# Patient Record
Sex: Male | Born: 1935 | Race: White | Hispanic: No | Smoking: Former smoker
Health system: Southern US, Community
[De-identification: ages and names within clinical notes are randomized; demographics above are authoritative.]

## PROBLEM LIST (undated history)

## (undated) DIAGNOSIS — I714 Abdominal aortic aneurysm, without rupture, unspecified: Secondary | ICD-10-CM

## (undated) DIAGNOSIS — N281 Cyst of kidney, acquired: Secondary | ICD-10-CM

## (undated) DIAGNOSIS — I350 Nonrheumatic aortic (valve) stenosis: Secondary | ICD-10-CM

## (undated) DIAGNOSIS — D7589 Other specified diseases of blood and blood-forming organs: Secondary | ICD-10-CM

## (undated) DIAGNOSIS — C61 Malignant neoplasm of prostate: Secondary | ICD-10-CM

## (undated) DIAGNOSIS — Z9981 Dependence on supplemental oxygen: Secondary | ICD-10-CM

## (undated) DIAGNOSIS — D509 Iron deficiency anemia, unspecified: Secondary | ICD-10-CM

## (undated) DIAGNOSIS — A0472 Enterocolitis due to Clostridium difficile, not specified as recurrent: Secondary | ICD-10-CM

## (undated) DIAGNOSIS — E119 Type 2 diabetes mellitus without complications: Secondary | ICD-10-CM

## (undated) DIAGNOSIS — J189 Pneumonia, unspecified organism: Secondary | ICD-10-CM

## (undated) DIAGNOSIS — K219 Gastro-esophageal reflux disease without esophagitis: Secondary | ICD-10-CM

## (undated) DIAGNOSIS — I10 Essential (primary) hypertension: Secondary | ICD-10-CM

## (undated) DIAGNOSIS — J449 Chronic obstructive pulmonary disease, unspecified: Secondary | ICD-10-CM

## (undated) DIAGNOSIS — M1990 Unspecified osteoarthritis, unspecified site: Secondary | ICD-10-CM

## (undated) DIAGNOSIS — E669 Obesity, unspecified: Secondary | ICD-10-CM

## (undated) DIAGNOSIS — E039 Hypothyroidism, unspecified: Secondary | ICD-10-CM

## (undated) DIAGNOSIS — I482 Chronic atrial fibrillation, unspecified: Secondary | ICD-10-CM

## (undated) DIAGNOSIS — I509 Heart failure, unspecified: Secondary | ICD-10-CM

## (undated) DIAGNOSIS — I214 Non-ST elevation (NSTEMI) myocardial infarction: Secondary | ICD-10-CM

## (undated) DIAGNOSIS — R58 Hemorrhage, not elsewhere classified: Secondary | ICD-10-CM

## (undated) DIAGNOSIS — I5021 Acute systolic (congestive) heart failure: Secondary | ICD-10-CM

## (undated) DIAGNOSIS — I2510 Atherosclerotic heart disease of native coronary artery without angina pectoris: Secondary | ICD-10-CM

## (undated) DIAGNOSIS — E785 Hyperlipidemia, unspecified: Secondary | ICD-10-CM

## (undated) DIAGNOSIS — H9190 Unspecified hearing loss, unspecified ear: Secondary | ICD-10-CM

## (undated) DIAGNOSIS — K259 Gastric ulcer, unspecified as acute or chronic, without hemorrhage or perforation: Secondary | ICD-10-CM

## (undated) DIAGNOSIS — I341 Nonrheumatic mitral (valve) prolapse: Secondary | ICD-10-CM

## (undated) DIAGNOSIS — M7989 Other specified soft tissue disorders: Secondary | ICD-10-CM

## (undated) DIAGNOSIS — R7302 Impaired glucose tolerance (oral): Secondary | ICD-10-CM

## (undated) DIAGNOSIS — I2723 Pulmonary hypertension due to lung diseases and hypoxia: Secondary | ICD-10-CM

## (undated) DIAGNOSIS — I071 Rheumatic tricuspid insufficiency: Secondary | ICD-10-CM

## (undated) DIAGNOSIS — D369 Benign neoplasm, unspecified site: Secondary | ICD-10-CM

## (undated) HISTORY — DX: Essential (primary) hypertension: I10

## (undated) HISTORY — DX: Nonrheumatic mitral (valve) prolapse: I34.1

## (undated) HISTORY — DX: Type 2 diabetes mellitus without complications: E11.9

## (undated) HISTORY — DX: Chronic obstructive pulmonary disease, unspecified: J44.9

## (undated) HISTORY — DX: Abdominal aortic aneurysm, without rupture: I71.4

## (undated) HISTORY — DX: Obesity, unspecified: E66.9

## (undated) HISTORY — PX: KNEE SURGERY: SHX244

## (undated) HISTORY — DX: Iron deficiency anemia, unspecified: D50.9

## (undated) HISTORY — DX: Chronic atrial fibrillation, unspecified: I48.20

## (undated) HISTORY — PX: APPENDECTOMY: SHX54

## (undated) HISTORY — DX: Pneumonia, unspecified organism: J18.9

## (undated) HISTORY — DX: Heart failure, unspecified: I50.9

## (undated) HISTORY — DX: Gastric ulcer, unspecified as acute or chronic, without hemorrhage or perforation: K25.9

## (undated) HISTORY — DX: Cyst of kidney, acquired: N28.1

## (undated) HISTORY — PX: TONSILLECTOMY: SUR1361

## (undated) HISTORY — PX: BLADDER SURGERY: SHX569

## (undated) HISTORY — DX: Hyperlipidemia, unspecified: E78.5

## (undated) HISTORY — DX: Atherosclerotic heart disease of native coronary artery without angina pectoris: I25.10

## (undated) HISTORY — DX: Malignant neoplasm of prostate: C61

## (undated) HISTORY — DX: Gastro-esophageal reflux disease without esophagitis: K21.9

## (undated) HISTORY — DX: Unspecified osteoarthritis, unspecified site: M19.90

## (undated) HISTORY — DX: Abdominal aortic aneurysm, without rupture, unspecified: I71.40

## (undated) HISTORY — PX: BACK SURGERY: SHX140

## (undated) HISTORY — DX: Impaired glucose tolerance (oral): R73.02

---

## 1992-06-24 DIAGNOSIS — I2510 Atherosclerotic heart disease of native coronary artery without angina pectoris: Secondary | ICD-10-CM

## 1992-06-24 HISTORY — DX: Atherosclerotic heart disease of native coronary artery without angina pectoris: I25.10

## 1997-12-15 ENCOUNTER — Inpatient Hospital Stay (HOSPITAL_COMMUNITY): Admission: EM | Admit: 1997-12-15 | Discharge: 1997-12-19 | Payer: Self-pay | Admitting: Emergency Medicine

## 1999-11-05 ENCOUNTER — Encounter: Admission: RE | Admit: 1999-11-05 | Discharge: 1999-11-08 | Payer: Self-pay | Admitting: Urology

## 2001-05-20 ENCOUNTER — Ambulatory Visit (HOSPITAL_COMMUNITY): Admission: RE | Admit: 2001-05-20 | Discharge: 2001-05-20 | Payer: Self-pay | Admitting: Cardiology

## 2001-06-24 DIAGNOSIS — I341 Nonrheumatic mitral (valve) prolapse: Secondary | ICD-10-CM

## 2001-06-24 HISTORY — PX: MITRAL VALVE REPLACEMENT (MVR)/CORONARY ARTERY BYPASS GRAFTING (CABG): SHX5984

## 2001-06-24 HISTORY — DX: Nonrheumatic mitral (valve) prolapse: I34.1

## 2001-07-09 ENCOUNTER — Ambulatory Visit: Admission: RE | Admit: 2001-07-09 | Discharge: 2001-07-09 | Payer: Self-pay | Admitting: Pulmonary Disease

## 2001-07-14 ENCOUNTER — Ambulatory Visit (HOSPITAL_COMMUNITY): Admission: RE | Admit: 2001-07-14 | Discharge: 2001-07-14 | Payer: Self-pay | Admitting: *Deleted

## 2001-07-17 ENCOUNTER — Ambulatory Visit (HOSPITAL_COMMUNITY): Admission: RE | Admit: 2001-07-17 | Discharge: 2001-07-17 | Payer: Self-pay | Admitting: Cardiology

## 2001-07-23 ENCOUNTER — Ambulatory Visit (HOSPITAL_COMMUNITY): Admission: RE | Admit: 2001-07-23 | Discharge: 2001-07-24 | Payer: Self-pay | Admitting: *Deleted

## 2001-07-24 ENCOUNTER — Encounter: Payer: Self-pay | Admitting: *Deleted

## 2001-07-29 ENCOUNTER — Inpatient Hospital Stay (HOSPITAL_COMMUNITY)
Admission: RE | Admit: 2001-07-29 | Discharge: 2001-08-11 | Payer: Self-pay | Admitting: Thoracic Surgery (Cardiothoracic Vascular Surgery)

## 2001-07-29 ENCOUNTER — Encounter: Payer: Self-pay | Admitting: Thoracic Surgery (Cardiothoracic Vascular Surgery)

## 2001-07-29 ENCOUNTER — Encounter (INDEPENDENT_AMBULATORY_CARE_PROVIDER_SITE_OTHER): Payer: Self-pay | Admitting: Specialist

## 2001-07-30 ENCOUNTER — Encounter: Payer: Self-pay | Admitting: Thoracic Surgery (Cardiothoracic Vascular Surgery)

## 2001-07-31 ENCOUNTER — Encounter: Payer: Self-pay | Admitting: Thoracic Surgery (Cardiothoracic Vascular Surgery)

## 2001-08-01 ENCOUNTER — Encounter: Payer: Self-pay | Admitting: Thoracic Surgery (Cardiothoracic Vascular Surgery)

## 2001-08-02 ENCOUNTER — Encounter: Payer: Self-pay | Admitting: Thoracic Surgery (Cardiothoracic Vascular Surgery)

## 2001-08-04 ENCOUNTER — Encounter: Payer: Self-pay | Admitting: Thoracic Surgery (Cardiothoracic Vascular Surgery)

## 2001-08-07 ENCOUNTER — Encounter: Payer: Self-pay | Admitting: Thoracic Surgery (Cardiothoracic Vascular Surgery)

## 2001-08-24 ENCOUNTER — Emergency Department (HOSPITAL_COMMUNITY): Admission: EM | Admit: 2001-08-24 | Discharge: 2001-08-24 | Payer: Self-pay | Admitting: Emergency Medicine

## 2001-08-24 ENCOUNTER — Encounter: Payer: Self-pay | Admitting: Emergency Medicine

## 2001-08-31 ENCOUNTER — Encounter
Admission: RE | Admit: 2001-08-31 | Discharge: 2001-08-31 | Payer: Self-pay | Admitting: Thoracic Surgery (Cardiothoracic Vascular Surgery)

## 2001-08-31 ENCOUNTER — Encounter: Payer: Self-pay | Admitting: Thoracic Surgery (Cardiothoracic Vascular Surgery)

## 2001-09-04 ENCOUNTER — Emergency Department (HOSPITAL_COMMUNITY): Admission: EM | Admit: 2001-09-04 | Discharge: 2001-09-04 | Payer: Self-pay | Admitting: Emergency Medicine

## 2001-09-04 ENCOUNTER — Encounter: Payer: Self-pay | Admitting: Emergency Medicine

## 2001-09-11 ENCOUNTER — Encounter: Payer: Self-pay | Admitting: Neurology

## 2001-09-11 ENCOUNTER — Ambulatory Visit (HOSPITAL_COMMUNITY): Admission: RE | Admit: 2001-09-11 | Discharge: 2001-09-11 | Payer: Self-pay | Admitting: Neurology

## 2001-09-23 ENCOUNTER — Encounter (HOSPITAL_COMMUNITY): Admission: RE | Admit: 2001-09-23 | Discharge: 2001-10-23 | Payer: Self-pay

## 2002-01-06 ENCOUNTER — Encounter (HOSPITAL_COMMUNITY): Admission: RE | Admit: 2002-01-06 | Discharge: 2002-02-05 | Payer: Self-pay | Admitting: Family Medicine

## 2002-06-24 DIAGNOSIS — K259 Gastric ulcer, unspecified as acute or chronic, without hemorrhage or perforation: Secondary | ICD-10-CM

## 2002-06-24 HISTORY — DX: Gastric ulcer, unspecified as acute or chronic, without hemorrhage or perforation: K25.9

## 2003-03-01 ENCOUNTER — Inpatient Hospital Stay (HOSPITAL_COMMUNITY): Admission: AD | Admit: 2003-03-01 | Discharge: 2003-03-06 | Payer: Self-pay | Admitting: Internal Medicine

## 2003-03-03 HISTORY — PX: ESOPHAGOGASTRODUODENOSCOPY: SHX1529

## 2004-02-17 ENCOUNTER — Encounter (HOSPITAL_COMMUNITY): Admission: RE | Admit: 2004-02-17 | Discharge: 2004-03-18 | Payer: Self-pay | Admitting: Oncology

## 2004-02-17 ENCOUNTER — Encounter: Admission: RE | Admit: 2004-02-17 | Discharge: 2004-02-17 | Payer: Self-pay | Admitting: Oncology

## 2004-04-13 ENCOUNTER — Encounter: Admission: RE | Admit: 2004-04-13 | Discharge: 2004-04-13 | Payer: Self-pay | Admitting: Oncology

## 2004-04-13 ENCOUNTER — Encounter (HOSPITAL_COMMUNITY): Admission: RE | Admit: 2004-04-13 | Discharge: 2004-05-13 | Payer: Self-pay | Admitting: Oncology

## 2004-05-07 ENCOUNTER — Ambulatory Visit: Payer: Self-pay | Admitting: Family Medicine

## 2004-05-07 ENCOUNTER — Ambulatory Visit: Payer: Self-pay | Admitting: *Deleted

## 2004-05-23 ENCOUNTER — Ambulatory Visit: Payer: Self-pay | Admitting: Family Medicine

## 2004-06-07 ENCOUNTER — Ambulatory Visit: Payer: Self-pay | Admitting: *Deleted

## 2004-06-21 ENCOUNTER — Ambulatory Visit: Payer: Self-pay | Admitting: Orthopedic Surgery

## 2004-07-02 ENCOUNTER — Ambulatory Visit: Payer: Self-pay | Admitting: Cardiology

## 2004-07-23 ENCOUNTER — Ambulatory Visit: Payer: Self-pay | Admitting: Family Medicine

## 2004-07-31 ENCOUNTER — Ambulatory Visit: Payer: Self-pay | Admitting: Cardiology

## 2004-08-29 ENCOUNTER — Ambulatory Visit: Payer: Self-pay | Admitting: Cardiology

## 2004-08-31 ENCOUNTER — Inpatient Hospital Stay (HOSPITAL_COMMUNITY): Admission: EM | Admit: 2004-08-31 | Discharge: 2004-09-04 | Payer: Self-pay | Admitting: Emergency Medicine

## 2004-08-31 ENCOUNTER — Ambulatory Visit: Payer: Self-pay | Admitting: Internal Medicine

## 2004-09-03 HISTORY — PX: ESOPHAGOGASTRODUODENOSCOPY: SHX1529

## 2004-09-03 HISTORY — PX: COLONOSCOPY: SHX174

## 2004-09-11 ENCOUNTER — Ambulatory Visit: Payer: Self-pay | Admitting: Family Medicine

## 2004-09-13 ENCOUNTER — Ambulatory Visit: Payer: Self-pay | Admitting: Internal Medicine

## 2004-10-23 ENCOUNTER — Ambulatory Visit: Payer: Self-pay | Admitting: *Deleted

## 2004-10-23 ENCOUNTER — Ambulatory Visit: Payer: Self-pay | Admitting: Family Medicine

## 2004-11-14 ENCOUNTER — Ambulatory Visit: Payer: Self-pay | Admitting: Internal Medicine

## 2004-11-14 ENCOUNTER — Ambulatory Visit: Payer: Self-pay | Admitting: Family Medicine

## 2004-11-14 ENCOUNTER — Ambulatory Visit (HOSPITAL_COMMUNITY): Admission: RE | Admit: 2004-11-14 | Discharge: 2004-11-14 | Payer: Self-pay | Admitting: Internal Medicine

## 2004-11-14 HISTORY — PX: ESOPHAGOGASTRODUODENOSCOPY: SHX1529

## 2004-11-14 HISTORY — PX: COLONOSCOPY: SHX174

## 2004-11-26 ENCOUNTER — Ambulatory Visit: Payer: Self-pay | Admitting: Family Medicine

## 2004-12-05 ENCOUNTER — Ambulatory Visit: Payer: Self-pay | Admitting: Internal Medicine

## 2004-12-26 ENCOUNTER — Ambulatory Visit: Payer: Self-pay | Admitting: Family Medicine

## 2005-01-11 ENCOUNTER — Ambulatory Visit: Payer: Self-pay | Admitting: Cardiology

## 2005-01-15 ENCOUNTER — Ambulatory Visit (HOSPITAL_COMMUNITY): Admission: RE | Admit: 2005-01-15 | Discharge: 2005-01-15 | Payer: Self-pay | Admitting: Cardiology

## 2005-01-15 ENCOUNTER — Ambulatory Visit: Payer: Self-pay | Admitting: Cardiology

## 2005-01-16 ENCOUNTER — Ambulatory Visit: Payer: Self-pay | Admitting: Internal Medicine

## 2005-01-22 ENCOUNTER — Ambulatory Visit: Payer: Self-pay | Admitting: *Deleted

## 2005-01-29 ENCOUNTER — Ambulatory Visit: Payer: Self-pay | Admitting: Internal Medicine

## 2005-01-29 ENCOUNTER — Ambulatory Visit (HOSPITAL_COMMUNITY): Admission: RE | Admit: 2005-01-29 | Discharge: 2005-01-29 | Payer: Self-pay | Admitting: Internal Medicine

## 2005-01-29 HISTORY — PX: COLONOSCOPY: SHX174

## 2005-02-12 ENCOUNTER — Ambulatory Visit: Payer: Self-pay | Admitting: Cardiology

## 2005-04-03 ENCOUNTER — Ambulatory Visit: Payer: Self-pay | Admitting: Family Medicine

## 2005-06-24 HISTORY — PX: EYE SURGERY: SHX253

## 2005-07-04 ENCOUNTER — Ambulatory Visit: Payer: Self-pay | Admitting: Family Medicine

## 2005-09-10 ENCOUNTER — Ambulatory Visit: Payer: Self-pay | Admitting: Family Medicine

## 2005-10-17 ENCOUNTER — Ambulatory Visit: Payer: Self-pay | Admitting: Family Medicine

## 2005-10-24 ENCOUNTER — Ambulatory Visit (HOSPITAL_COMMUNITY): Admission: RE | Admit: 2005-10-24 | Discharge: 2005-10-24 | Payer: Self-pay | Admitting: Family Medicine

## 2005-12-20 ENCOUNTER — Ambulatory Visit: Payer: Self-pay | Admitting: Family Medicine

## 2006-02-28 ENCOUNTER — Ambulatory Visit: Payer: Self-pay | Admitting: Family Medicine

## 2006-04-21 ENCOUNTER — Ambulatory Visit: Payer: Self-pay | Admitting: Family Medicine

## 2006-04-25 ENCOUNTER — Ambulatory Visit: Payer: Self-pay | Admitting: Family Medicine

## 2006-04-29 ENCOUNTER — Ambulatory Visit (HOSPITAL_COMMUNITY): Admission: RE | Admit: 2006-04-29 | Discharge: 2006-04-29 | Payer: Self-pay | Admitting: Family Medicine

## 2006-06-02 ENCOUNTER — Ambulatory Visit: Payer: Self-pay | Admitting: Family Medicine

## 2006-07-14 ENCOUNTER — Ambulatory Visit: Payer: Self-pay | Admitting: Family Medicine

## 2006-08-04 ENCOUNTER — Encounter: Payer: Self-pay | Admitting: Family Medicine

## 2006-08-04 LAB — CONVERTED CEMR LAB
ALT: 14 units/L (ref 0–53)
AST: 18 units/L (ref 0–37)
Albumin: 4.4 g/dL (ref 3.5–5.2)
Alkaline Phosphatase: 112 units/L (ref 39–117)
Calcium: 9 mg/dL (ref 8.4–10.5)
Creatinine, Ser: 0.92 mg/dL (ref 0.40–1.50)
HDL: 36 mg/dL — ABNORMAL LOW (ref >39)
Sodium: 142 meq/L (ref 135–145)
Total CHOL/HDL Ratio: 3.1
Total Protein: 7.5 g/dL (ref 6.0–8.3)
Triglycerides: 109 mg/dL (ref <150)

## 2006-08-15 ENCOUNTER — Ambulatory Visit: Payer: Self-pay | Admitting: Family Medicine

## 2006-09-26 ENCOUNTER — Ambulatory Visit: Payer: Self-pay | Admitting: Family Medicine

## 2006-12-01 ENCOUNTER — Encounter: Payer: Self-pay | Admitting: Family Medicine

## 2006-12-01 LAB — CONVERTED CEMR LAB
AST: 17 units/L (ref 0–37)
Alkaline Phosphatase: 105 units/L (ref 39–117)
BUN: 20 mg/dL (ref 6–23)
Calcium: 8.9 mg/dL (ref 8.4–10.5)
Chloride: 105 meq/L (ref 96–112)
Cholesterol: 109 mg/dL (ref 0–200)
Creatinine, Ser: 1.09 mg/dL (ref 0.40–1.50)
Indirect Bilirubin: 0.3 mg/dL (ref 0.0–0.9)
LDL Cholesterol: 47 mg/dL (ref 0–99)
Total Protein: 7.6 g/dL (ref 6.0–8.3)
Triglycerides: 97 mg/dL (ref <150)

## 2006-12-03 ENCOUNTER — Ambulatory Visit: Payer: Self-pay | Admitting: Family Medicine

## 2006-12-09 ENCOUNTER — Encounter: Payer: Self-pay | Admitting: Family Medicine

## 2006-12-29 ENCOUNTER — Ambulatory Visit: Payer: Self-pay | Admitting: Family Medicine

## 2007-01-07 ENCOUNTER — Ambulatory Visit: Payer: Self-pay | Admitting: Family Medicine

## 2007-01-28 ENCOUNTER — Ambulatory Visit: Payer: Self-pay | Admitting: Internal Medicine

## 2007-01-28 ENCOUNTER — Inpatient Hospital Stay (HOSPITAL_COMMUNITY): Admission: EM | Admit: 2007-01-28 | Discharge: 2007-02-02 | Payer: Self-pay | Admitting: Emergency Medicine

## 2007-01-30 ENCOUNTER — Encounter (INDEPENDENT_AMBULATORY_CARE_PROVIDER_SITE_OTHER): Payer: Self-pay | Admitting: Urology

## 2007-02-12 ENCOUNTER — Ambulatory Visit: Payer: Self-pay | Admitting: Family Medicine

## 2007-03-17 ENCOUNTER — Ambulatory Visit: Payer: Self-pay | Admitting: Cardiology

## 2007-03-26 ENCOUNTER — Encounter: Payer: Self-pay | Admitting: Family Medicine

## 2007-03-26 LAB — CONVERTED CEMR LAB
Eosinophils Absolute: 0.4 10*3/uL (ref 0.0–0.7)
Eosinophils Relative: 5 % (ref 0–5)
HCT: 40.8 % (ref 39.0–52.0)
Lymphs Abs: 1.2 10*3/uL (ref 0.7–3.3)
MCV: 82.9 fL (ref 78.0–100.0)
Monocytes Relative: 10 % (ref 3–11)
PSA: 25.92 ng/mL — ABNORMAL HIGH (ref 0.10–4.00)
Platelets: 271 10*3/uL (ref 150–400)
WBC: 7.9 10*3/uL (ref 4.0–10.5)

## 2007-04-01 ENCOUNTER — Ambulatory Visit: Payer: Self-pay | Admitting: Family Medicine

## 2007-06-02 ENCOUNTER — Ambulatory Visit: Payer: Self-pay | Admitting: Family Medicine

## 2007-06-12 ENCOUNTER — Encounter: Payer: Self-pay | Admitting: Family Medicine

## 2007-08-19 ENCOUNTER — Ambulatory Visit: Payer: Self-pay | Admitting: Family Medicine

## 2007-12-17 ENCOUNTER — Ambulatory Visit: Payer: Self-pay | Admitting: Family Medicine

## 2007-12-17 DIAGNOSIS — E739 Lactose intolerance, unspecified: Secondary | ICD-10-CM | POA: Insufficient documentation

## 2007-12-17 DIAGNOSIS — E785 Hyperlipidemia, unspecified: Secondary | ICD-10-CM

## 2007-12-17 DIAGNOSIS — I10 Essential (primary) hypertension: Secondary | ICD-10-CM

## 2007-12-17 HISTORY — DX: Essential (primary) hypertension: I10

## 2007-12-28 ENCOUNTER — Ambulatory Visit: Payer: Self-pay | Admitting: Cardiology

## 2007-12-30 ENCOUNTER — Inpatient Hospital Stay (HOSPITAL_COMMUNITY): Admission: EM | Admit: 2007-12-30 | Discharge: 2007-12-31 | Payer: Self-pay | Admitting: Emergency Medicine

## 2008-01-03 ENCOUNTER — Emergency Department (HOSPITAL_COMMUNITY): Admission: EM | Admit: 2008-01-03 | Discharge: 2008-01-03 | Payer: Self-pay | Admitting: Emergency Medicine

## 2008-01-11 ENCOUNTER — Encounter: Payer: Self-pay | Admitting: Family Medicine

## 2008-01-19 ENCOUNTER — Telehealth: Payer: Self-pay | Admitting: Family Medicine

## 2008-03-17 ENCOUNTER — Encounter: Payer: Self-pay | Admitting: Family Medicine

## 2008-03-17 LAB — CONVERTED CEMR LAB
BUN: 17 mg/dL (ref 6–23)
Bilirubin, Direct: 0.2 mg/dL (ref 0.0–0.3)
Chloride: 106 meq/L (ref 96–112)
Glucose, Bld: 100 mg/dL — ABNORMAL HIGH (ref 70–99)
Indirect Bilirubin: 0.4 mg/dL (ref 0.0–0.9)
LDL Cholesterol: 57 mg/dL (ref 0–99)
Potassium: 4.3 meq/L (ref 3.5–5.3)
Total Bilirubin: 0.6 mg/dL (ref 0.3–1.2)
VLDL: 15 mg/dL (ref 0–40)

## 2008-03-22 ENCOUNTER — Ambulatory Visit: Payer: Self-pay | Admitting: Family Medicine

## 2008-03-22 LAB — CONVERTED CEMR LAB: Hgb A1c MFr Bld: 5.8 %

## 2008-06-29 ENCOUNTER — Ambulatory Visit: Payer: Self-pay | Admitting: Family Medicine

## 2008-06-29 DIAGNOSIS — R5383 Other fatigue: Secondary | ICD-10-CM

## 2008-06-29 DIAGNOSIS — R5381 Other malaise: Secondary | ICD-10-CM

## 2008-06-30 ENCOUNTER — Encounter: Payer: Self-pay | Admitting: Family Medicine

## 2008-06-30 LAB — CONVERTED CEMR LAB
AST: 15 units/L (ref 0–37)
Bilirubin, Direct: 0.2 mg/dL (ref 0.0–0.3)
CO2: 24 meq/L (ref 19–32)
Calcium: 8.8 mg/dL (ref 8.4–10.5)
Eosinophils Relative: 2 % (ref 0–5)
Glucose, Bld: 111 mg/dL — ABNORMAL HIGH (ref 70–99)
HCT: 36.7 % — ABNORMAL LOW (ref 39.0–52.0)
Hemoglobin: 10.4 g/dL — ABNORMAL LOW (ref 13.0–17.0)
LDL Cholesterol: 62 mg/dL (ref 0–99)
Lymphocytes Relative: 13 % (ref 12–46)
Lymphs Abs: 1 10*3/uL (ref 0.7–4.0)
Monocytes Absolute: 0.9 10*3/uL (ref 0.1–1.0)
Sodium: 142 meq/L (ref 135–145)
TSH: 3.408 microintl units/mL (ref 0.350–4.50)
Total Bilirubin: 0.7 mg/dL (ref 0.3–1.2)
Total CHOL/HDL Ratio: 2.7
WBC: 7.5 10*3/uL (ref 4.0–10.5)

## 2008-07-01 LAB — CONVERTED CEMR LAB: Retic Ct Pct: 1 % (ref 0.4–3.1)

## 2008-07-22 ENCOUNTER — Ambulatory Visit (HOSPITAL_COMMUNITY): Payer: Self-pay | Admitting: Oncology

## 2008-07-22 ENCOUNTER — Encounter (HOSPITAL_COMMUNITY): Admission: RE | Admit: 2008-07-22 | Discharge: 2008-08-21 | Payer: Self-pay | Admitting: Oncology

## 2008-07-22 ENCOUNTER — Encounter: Payer: Self-pay | Admitting: Family Medicine

## 2008-07-31 ENCOUNTER — Emergency Department (HOSPITAL_COMMUNITY): Admission: EM | Admit: 2008-07-31 | Discharge: 2008-07-31 | Payer: Self-pay | Admitting: Emergency Medicine

## 2008-08-01 ENCOUNTER — Telehealth: Payer: Self-pay | Admitting: Family Medicine

## 2008-08-02 ENCOUNTER — Encounter: Payer: Self-pay | Admitting: Family Medicine

## 2008-08-03 ENCOUNTER — Encounter: Payer: Self-pay | Admitting: Family Medicine

## 2008-08-03 ENCOUNTER — Inpatient Hospital Stay (HOSPITAL_COMMUNITY): Admission: EM | Admit: 2008-08-03 | Discharge: 2008-08-06 | Payer: Self-pay | Admitting: Emergency Medicine

## 2008-08-04 ENCOUNTER — Encounter (INDEPENDENT_AMBULATORY_CARE_PROVIDER_SITE_OTHER): Payer: Self-pay | Admitting: Urology

## 2008-08-04 ENCOUNTER — Encounter: Payer: Self-pay | Admitting: Family Medicine

## 2008-08-19 ENCOUNTER — Encounter: Payer: Self-pay | Admitting: Family Medicine

## 2008-09-02 ENCOUNTER — Encounter: Payer: Self-pay | Admitting: Family Medicine

## 2008-09-26 ENCOUNTER — Ambulatory Visit (HOSPITAL_COMMUNITY): Payer: Self-pay | Admitting: Oncology

## 2008-09-26 ENCOUNTER — Encounter (HOSPITAL_COMMUNITY): Admission: RE | Admit: 2008-09-26 | Discharge: 2008-10-26 | Payer: Self-pay | Admitting: Oncology

## 2008-09-27 ENCOUNTER — Encounter: Payer: Self-pay | Admitting: Family Medicine

## 2008-10-27 ENCOUNTER — Ambulatory Visit: Payer: Self-pay | Admitting: Family Medicine

## 2008-11-04 ENCOUNTER — Encounter (HOSPITAL_COMMUNITY): Admission: RE | Admit: 2008-11-04 | Discharge: 2008-12-04 | Payer: Self-pay | Admitting: Oncology

## 2008-11-06 ENCOUNTER — Emergency Department (HOSPITAL_COMMUNITY): Admission: EM | Admit: 2008-11-06 | Discharge: 2008-11-06 | Payer: Self-pay | Admitting: Emergency Medicine

## 2008-11-07 ENCOUNTER — Emergency Department (HOSPITAL_COMMUNITY): Admission: EM | Admit: 2008-11-07 | Discharge: 2008-11-07 | Payer: Self-pay | Admitting: Emergency Medicine

## 2008-11-11 ENCOUNTER — Encounter: Payer: Self-pay | Admitting: Family Medicine

## 2008-12-05 ENCOUNTER — Encounter: Payer: Self-pay | Admitting: Family Medicine

## 2008-12-12 ENCOUNTER — Encounter: Payer: Self-pay | Admitting: Family Medicine

## 2008-12-12 LAB — CONVERTED CEMR LAB
ALT: 43 units/L (ref 0–53)
Albumin: 4.5 g/dL (ref 3.5–5.2)
BUN: 14 mg/dL (ref 6–23)
Chloride: 104 meq/L (ref 96–112)
HDL: 63 mg/dL (ref >39)
LDL Cholesterol: 61 mg/dL (ref 0–99)
Potassium: 4.5 meq/L (ref 3.5–5.3)
Sodium: 145 meq/L (ref 135–145)
Total CHOL/HDL Ratio: 2.3
Total Protein: 8 g/dL (ref 6.0–8.3)
Triglycerides: 109 mg/dL (ref <150)
VLDL: 22 mg/dL (ref 0–40)

## 2008-12-15 ENCOUNTER — Ambulatory Visit: Payer: Self-pay | Admitting: Family Medicine

## 2008-12-15 LAB — CONVERTED CEMR LAB: Hgb A1c MFr Bld: 6 %

## 2008-12-19 ENCOUNTER — Telehealth: Payer: Self-pay | Admitting: Family Medicine

## 2008-12-30 ENCOUNTER — Telehealth: Payer: Self-pay | Admitting: Family Medicine

## 2008-12-30 ENCOUNTER — Ambulatory Visit: Payer: Self-pay | Admitting: Family Medicine

## 2009-01-13 ENCOUNTER — Ambulatory Visit (HOSPITAL_COMMUNITY): Payer: Self-pay | Admitting: Oncology

## 2009-01-13 ENCOUNTER — Encounter (HOSPITAL_COMMUNITY): Admission: RE | Admit: 2009-01-13 | Discharge: 2009-02-12 | Payer: Self-pay | Admitting: Oncology

## 2009-01-17 ENCOUNTER — Encounter: Payer: Self-pay | Admitting: Family Medicine

## 2009-02-17 ENCOUNTER — Encounter (HOSPITAL_COMMUNITY): Admission: RE | Admit: 2009-02-17 | Discharge: 2009-03-19 | Payer: Self-pay | Admitting: Oncology

## 2009-03-01 ENCOUNTER — Ambulatory Visit (HOSPITAL_COMMUNITY): Payer: Self-pay | Admitting: Oncology

## 2009-03-06 ENCOUNTER — Encounter: Payer: Self-pay | Admitting: Family Medicine

## 2009-03-10 ENCOUNTER — Encounter: Payer: Self-pay | Admitting: Family Medicine

## 2009-03-16 ENCOUNTER — Ambulatory Visit: Payer: Self-pay | Admitting: Family Medicine

## 2009-03-16 LAB — CONVERTED CEMR LAB: Hgb A1c MFr Bld: 5.9 %

## 2009-04-17 ENCOUNTER — Telehealth: Payer: Self-pay | Admitting: Family Medicine

## 2009-04-18 ENCOUNTER — Ambulatory Visit (HOSPITAL_COMMUNITY): Payer: Self-pay | Admitting: Oncology

## 2009-04-24 ENCOUNTER — Telehealth: Payer: Self-pay | Admitting: Family Medicine

## 2009-05-10 ENCOUNTER — Ambulatory Visit: Payer: Self-pay | Admitting: Orthopedic Surgery

## 2009-05-10 DIAGNOSIS — M1710 Unilateral primary osteoarthritis, unspecified knee: Secondary | ICD-10-CM

## 2009-05-11 ENCOUNTER — Encounter: Payer: Self-pay | Admitting: Orthopedic Surgery

## 2009-05-11 ENCOUNTER — Ambulatory Visit (HOSPITAL_COMMUNITY): Admission: RE | Admit: 2009-05-11 | Discharge: 2009-05-11 | Payer: Self-pay | Admitting: Orthopedic Surgery

## 2009-05-12 ENCOUNTER — Telehealth: Payer: Self-pay | Admitting: Orthopedic Surgery

## 2009-05-22 ENCOUNTER — Telehealth: Payer: Self-pay | Admitting: Orthopedic Surgery

## 2009-06-02 ENCOUNTER — Telehealth: Payer: Self-pay | Admitting: Family Medicine

## 2009-06-05 ENCOUNTER — Telehealth: Payer: Self-pay | Admitting: Family Medicine

## 2009-06-10 ENCOUNTER — Emergency Department (HOSPITAL_COMMUNITY): Admission: EM | Admit: 2009-06-10 | Discharge: 2009-06-10 | Payer: Self-pay | Admitting: Emergency Medicine

## 2009-06-12 ENCOUNTER — Telehealth: Payer: Self-pay | Admitting: Family Medicine

## 2009-06-12 ENCOUNTER — Encounter: Payer: Self-pay | Admitting: Family Medicine

## 2009-06-13 ENCOUNTER — Ambulatory Visit (HOSPITAL_COMMUNITY): Admission: RE | Admit: 2009-06-13 | Discharge: 2009-06-13 | Payer: Self-pay | Admitting: Family Medicine

## 2009-06-13 ENCOUNTER — Encounter: Payer: Self-pay | Admitting: Family Medicine

## 2009-06-13 LAB — CONVERTED CEMR LAB
ALT: 27 units/L (ref 0–53)
BUN: 36 mg/dL — ABNORMAL HIGH (ref 6–23)
Basophils Absolute: 0 10*3/uL (ref 0.0–0.1)
Bilirubin, Direct: 0.3 mg/dL (ref 0.0–0.3)
Chloride: 100 meq/L (ref 96–112)
Creatinine, Ser: 1.34 mg/dL (ref 0.40–1.50)
Hgb A1c MFr Bld: 6.2 % — ABNORMAL HIGH (ref 4.6–6.1)
Indirect Bilirubin: 0.5 mg/dL (ref 0.0–0.9)
Lymphocytes Relative: 4 % — ABNORMAL LOW (ref 12–46)
Lymphs Abs: 0.5 10*3/uL — ABNORMAL LOW (ref 0.7–4.0)
Neutrophils Relative %: 93 % — ABNORMAL HIGH (ref 43–77)
Platelets: 189 10*3/uL (ref 150–400)
Potassium: 5.2 meq/L (ref 3.5–5.3)
RDW: 14.9 % (ref 11.5–15.5)
Total Bilirubin: 0.8 mg/dL (ref 0.3–1.2)
WBC: 12.2 10*3/uL — ABNORMAL HIGH (ref 4.0–10.5)

## 2009-06-19 ENCOUNTER — Encounter: Payer: Self-pay | Admitting: Family Medicine

## 2009-06-20 ENCOUNTER — Telehealth: Payer: Self-pay | Admitting: Family Medicine

## 2009-06-20 ENCOUNTER — Encounter: Payer: Self-pay | Admitting: Family Medicine

## 2009-06-21 ENCOUNTER — Encounter: Payer: Self-pay | Admitting: Family Medicine

## 2009-06-21 ENCOUNTER — Ambulatory Visit (HOSPITAL_COMMUNITY): Payer: Self-pay | Admitting: Oncology

## 2009-06-21 ENCOUNTER — Encounter (HOSPITAL_COMMUNITY): Admission: RE | Admit: 2009-06-21 | Discharge: 2009-06-23 | Payer: Self-pay | Admitting: Oncology

## 2009-06-22 ENCOUNTER — Telehealth: Payer: Self-pay | Admitting: Family Medicine

## 2009-06-22 ENCOUNTER — Ambulatory Visit: Payer: Self-pay | Admitting: Family Medicine

## 2009-06-26 ENCOUNTER — Encounter: Payer: Self-pay | Admitting: Family Medicine

## 2009-06-28 ENCOUNTER — Encounter: Payer: Self-pay | Admitting: Family Medicine

## 2009-06-28 ENCOUNTER — Encounter (HOSPITAL_COMMUNITY): Admission: RE | Admit: 2009-06-28 | Discharge: 2009-07-28 | Payer: Self-pay | Admitting: Family Medicine

## 2009-07-03 ENCOUNTER — Telehealth: Payer: Self-pay | Admitting: Family Medicine

## 2009-07-06 ENCOUNTER — Encounter: Payer: Self-pay | Admitting: Family Medicine

## 2009-07-10 ENCOUNTER — Telehealth: Payer: Self-pay | Admitting: Family Medicine

## 2009-07-19 ENCOUNTER — Encounter: Payer: Self-pay | Admitting: Family Medicine

## 2009-07-26 ENCOUNTER — Encounter: Payer: Self-pay | Admitting: Family Medicine

## 2009-08-07 ENCOUNTER — Encounter: Payer: Self-pay | Admitting: Family Medicine

## 2009-08-07 ENCOUNTER — Telehealth: Payer: Self-pay | Admitting: Family Medicine

## 2009-08-15 ENCOUNTER — Encounter: Payer: Self-pay | Admitting: Family Medicine

## 2009-08-21 ENCOUNTER — Telehealth: Payer: Self-pay | Admitting: Physician Assistant

## 2009-09-18 ENCOUNTER — Ambulatory Visit: Payer: Self-pay | Admitting: Family Medicine

## 2009-09-19 ENCOUNTER — Encounter: Payer: Self-pay | Admitting: Family Medicine

## 2009-09-19 LAB — CONVERTED CEMR LAB
Albumin: 4.5 g/dL (ref 3.5–5.2)
Bilirubin, Direct: 0.3 mg/dL (ref 0.0–0.3)
CO2: 25 meq/L (ref 19–32)
Calcium: 9.5 mg/dL (ref 8.4–10.5)
Chloride: 103 meq/L (ref 96–112)
Glucose, Bld: 100 mg/dL — ABNORMAL HIGH (ref 70–99)
HDL: 48 mg/dL (ref >39)
Hgb A1c MFr Bld: 5.7 % (ref 4.6–6.1)
LDL Cholesterol: 62 mg/dL (ref 0–99)
Sodium: 143 meq/L (ref 135–145)
Total Bilirubin: 1 mg/dL (ref 0.3–1.2)
Total CHOL/HDL Ratio: 2.7
VLDL: 18 mg/dL (ref 0–40)

## 2009-09-20 ENCOUNTER — Ambulatory Visit (HOSPITAL_COMMUNITY): Payer: Self-pay | Admitting: Oncology

## 2009-09-20 ENCOUNTER — Encounter (HOSPITAL_COMMUNITY): Admission: RE | Admit: 2009-09-20 | Discharge: 2009-10-20 | Payer: Self-pay | Admitting: Oncology

## 2009-11-15 ENCOUNTER — Encounter (HOSPITAL_COMMUNITY): Admission: RE | Admit: 2009-11-15 | Discharge: 2009-12-15 | Payer: Self-pay | Admitting: Oncology

## 2009-11-15 ENCOUNTER — Ambulatory Visit (HOSPITAL_COMMUNITY): Payer: Self-pay | Admitting: Oncology

## 2009-12-20 ENCOUNTER — Encounter (HOSPITAL_COMMUNITY): Admission: RE | Admit: 2009-12-20 | Discharge: 2010-01-19 | Payer: Self-pay | Admitting: Oncology

## 2009-12-21 ENCOUNTER — Ambulatory Visit: Payer: Self-pay | Admitting: Family Medicine

## 2009-12-21 ENCOUNTER — Telehealth: Payer: Self-pay | Admitting: Family Medicine

## 2009-12-21 DIAGNOSIS — R7301 Impaired fasting glucose: Secondary | ICD-10-CM

## 2010-01-24 ENCOUNTER — Encounter: Payer: Self-pay | Admitting: Family Medicine

## 2010-01-24 ENCOUNTER — Ambulatory Visit (HOSPITAL_COMMUNITY): Payer: Self-pay | Admitting: Oncology

## 2010-03-09 ENCOUNTER — Ambulatory Visit: Payer: Self-pay | Admitting: Family Medicine

## 2010-03-27 ENCOUNTER — Encounter: Payer: Self-pay | Admitting: Family Medicine

## 2010-03-27 LAB — CONVERTED CEMR LAB: Hep B C IgM: NEGATIVE

## 2010-03-28 LAB — CONVERTED CEMR LAB
ALT: 37 units/L (ref 0–53)
AST: 45 units/L — ABNORMAL HIGH (ref 0–37)
Albumin: 4.6 g/dL (ref 3.5–5.2)
Alkaline Phosphatase: 116 units/L (ref 39–117)
Cholesterol: 153 mg/dL (ref 0–200)
HDL: 55 mg/dL (ref >39)
TSH: 3.49 microintl units/mL (ref 0.350–4.500)
Total Bilirubin: 0.7 mg/dL (ref 0.3–1.2)
Total CHOL/HDL Ratio: 2.8
Total Protein: 7.5 g/dL (ref 6.0–8.3)
Triglycerides: 116 mg/dL (ref <150)

## 2010-04-16 ENCOUNTER — Encounter: Payer: Self-pay | Admitting: Family Medicine

## 2010-04-17 ENCOUNTER — Telehealth: Payer: Self-pay | Admitting: Family Medicine

## 2010-04-18 ENCOUNTER — Encounter (HOSPITAL_COMMUNITY)
Admission: RE | Admit: 2010-04-18 | Discharge: 2010-05-18 | Payer: Self-pay | Source: Home / Self Care | Admitting: Oncology

## 2010-04-18 ENCOUNTER — Ambulatory Visit (HOSPITAL_COMMUNITY): Payer: Self-pay | Admitting: Oncology

## 2010-06-07 ENCOUNTER — Encounter: Payer: Self-pay | Admitting: Family Medicine

## 2010-06-28 ENCOUNTER — Encounter (HOSPITAL_COMMUNITY)
Admission: RE | Admit: 2010-06-28 | Discharge: 2010-07-24 | Payer: Self-pay | Source: Home / Self Care | Attending: Oncology | Admitting: Oncology

## 2010-06-29 ENCOUNTER — Encounter: Payer: Self-pay | Admitting: Family Medicine

## 2010-06-29 ENCOUNTER — Ambulatory Visit (HOSPITAL_COMMUNITY)
Admission: RE | Admit: 2010-06-29 | Discharge: 2010-07-24 | Payer: Self-pay | Source: Home / Self Care | Attending: Oncology | Admitting: Oncology

## 2010-06-29 LAB — CBC
HCT: 45.3 % (ref 39.0–52.0)
Hemoglobin: 15.3 g/dL (ref 13.0–17.0)
MCH: 32.6 pg (ref 26.0–34.0)
MCHC: 33.8 g/dL (ref 30.0–36.0)
MCV: 96.6 fL (ref 78.0–100.0)
Platelets: 125 10*3/uL — ABNORMAL LOW (ref 150–400)
RBC: 4.69 MIL/uL (ref 4.22–5.81)
RDW: 13.8 % (ref 11.5–15.5)
WBC: 7.4 10*3/uL (ref 4.0–10.5)

## 2010-07-09 LAB — FERRITIN: Ferritin: 108 ng/mL (ref 22–322)

## 2010-07-10 ENCOUNTER — Ambulatory Visit (HOSPITAL_COMMUNITY)
Admission: RE | Admit: 2010-07-10 | Discharge: 2010-07-10 | Payer: Self-pay | Source: Home / Self Care | Attending: Family Medicine | Admitting: Family Medicine

## 2010-07-10 ENCOUNTER — Encounter: Payer: Self-pay | Admitting: Family Medicine

## 2010-07-10 ENCOUNTER — Ambulatory Visit
Admission: RE | Admit: 2010-07-10 | Discharge: 2010-07-10 | Payer: Self-pay | Source: Home / Self Care | Attending: Family Medicine | Admitting: Family Medicine

## 2010-07-10 DIAGNOSIS — J4489 Other specified chronic obstructive pulmonary disease: Secondary | ICD-10-CM | POA: Insufficient documentation

## 2010-07-10 DIAGNOSIS — J449 Chronic obstructive pulmonary disease, unspecified: Secondary | ICD-10-CM | POA: Insufficient documentation

## 2010-07-12 LAB — CONVERTED CEMR LAB
BUN: 18 mg/dL (ref 6–23)
Bilirubin, Direct: 0.3 mg/dL (ref 0.0–0.3)
Chloride: 105 meq/L (ref 96–112)
Cholesterol: 124 mg/dL (ref 0–200)
Creatinine, Ser: 0.89 mg/dL (ref 0.40–1.50)
Eosinophils Absolute: 0.2 10*3/uL (ref 0.0–0.7)
Glucose, Bld: 99 mg/dL (ref 70–99)
Hemoglobin: 15.9 g/dL (ref 13.0–17.0)
Hgb A1c MFr Bld: 5.9 % — ABNORMAL HIGH (ref <5.7)
Indirect Bilirubin: 0.6 mg/dL (ref 0.0–0.9)
LDL Cholesterol: 58 mg/dL (ref 0–99)
Lymphs Abs: 0.8 10*3/uL (ref 0.7–4.0)
MCV: 100.4 fL — ABNORMAL HIGH (ref 78.0–100.0)
Monocytes Absolute: 1 10*3/uL (ref 0.1–1.0)
Monocytes Relative: 12 % (ref 3–12)
Neutrophils Relative %: 76 % (ref 43–77)
Potassium: 4.5 meq/L (ref 3.5–5.3)
RBC: 5 M/uL (ref 4.22–5.81)
VLDL: 15 mg/dL (ref 0–40)
WBC: 8.3 10*3/uL (ref 4.0–10.5)

## 2010-07-15 ENCOUNTER — Encounter: Payer: Self-pay | Admitting: Urology

## 2010-07-21 DIAGNOSIS — J189 Pneumonia, unspecified organism: Secondary | ICD-10-CM | POA: Insufficient documentation

## 2010-07-26 NOTE — Progress Notes (Signed)
Summary: INTERDISCIPLINARY PROGRESS REPORT  INTERDISCIPLINARY PROGRESS REPORT   Imported By: Lind Guest 07/19/2009 14:59:21  _____________________________________________________________________  External Attachment:    Type:   Image     Comment:   External Document

## 2010-07-26 NOTE — Assessment & Plan Note (Signed)
Summary: F UP   Vital Signs:  Patient profile:   77 year old male Height:      71 inches Weight:      233.75 pounds O2 Sat:      89 % on Room air Pulse rate:   84 / minute Pulse rhythm:   regular Resp:     16 per minute BP sitting:   120 / 80  (left arm) Cuff size:   regular  Vitals Entered By: Everitt Amber LPN (September 18, 2009 1:00 PM)  O2 Flow:  Room air CC: he doesn't wear the oxygen at home    CC:  he doesn't wear the oxygen at home .  History of Present Illness: Reports  thathe has been doing well. He and his daughter both state that the pt is not using the oxygen most of the toime as prescribed, this is a waste of money and they want to get rid of it. Denies recent fever or chills. Denies sinus pressure, nasal congestion , ear pain or sore throat. Denies chest congestion, or cough productive of sputum. Denies chest pain, palpitations, PND, orthopnea or leg swelling.He does have exertional fatigue. Denies abdominal pain, nausea, vomitting, diarrhea or constipation. Denies change in bowel movements or bloody stool. Denies dysuria , frequency, incontinence or hesitancy. reports  joint pain, swelling, and reduced mobility.espescially of the knees. Denies headaches, vertigo, seizures. Denies depression, anxiety or insomnia. Denies  rash, lesions, or itch.     Preventive Screening-Counseling & Management  Alcohol-Tobacco     Smoking Status: current     Smoking Cessation Counseling: yes     Packs/Day: 0.25  Current Medications (verified): 1)  Allopurinol 300 Mg  Tabs (Allopurinol) .... One Tab By Mouth Once Daily 2)  Benazepril Hcl 20 Mg  Tabs (Benazepril Hcl) .... One Tab By Mouth Two Times A Day 3)  Centrum Silver   Tabs (Multiple Vitamins-Minerals) .... One Tab By Mouth Once Daily 4)  Metoprolol Tartrate 50 Mg  Tabs (Metoprolol Tartrate) .... One Tab By Mouth Two Times A Day 5)  Advair Diskus 100-50 Mcg/dose Misc (Fluticasone-Salmeterol) .... One Puff Bid 6)   Tramadol Hcl 50 Mg Tabs (Tramadol Hcl) .... Take 1 Tablet By Mouth Two Times A Day 7)  Omeprazole 40 Mg Cpdr (Omeprazole) .... One Cap By Mouth Qd 8)  Lovastatin 40 Mg Tabs (Lovastatin) .... One Tab By Mouth At Bedtime  Discontinue Lipitor  Allergies (verified): 1)  ! Jonne Ply  Family History:  MOTHER DECEASED  STROKE FATHER DECASED CAUSE UNKNOWN ONE SISTER LIVING HEALTHY ONE BROTHER died at age 55 in 11-10-2009 FOUR BROTHERS  DECEASED / TWO CANCER / ONE HEART ATTACK / ONE STROKE  Social History: Retired Current Smoker Alcohol use-no Drug use-no Widow 3 children living, one son decceased at age 58 due drunk driving Packs/Day:  2.13  Review of Systems      See HPI Eyes:  Denies blurring and discharge. Derm:  Complains of lesion(s); multiple tatoos. Neuro:  Complains of poor balance; denies headaches, seizures, and sensation of room spinning. Psych:  Denies anxiety and depression. Endo:  Denies cold intolerance, excessive hunger, excessive thirst, excessive urination, heat intolerance, polyuria, and weight change. Heme:  Denies abnormal bruising and bleeding. Allergy:  Complains of seasonal allergies.  Physical Exam  General:  Well-developed,overweight,in no acute distress; alert,appropriate and cooperative throughout examination HEENT: No facial asymmetry,  EOMI, No sinus tenderness, TM's Clear, oropharynx  pink and moist.   Chest: Clear to auscultation bilaterally. Decreased  air entry bilaterally. CVS: S1, S2, systolic murmur, No S3. Irregularly , irregular heart rate  Abd: Soft, Nontender.  EA:[id number]  ROM spine, hips, shoulders and knees.  Ext: No edema.   CNS: CN 2-12 intact, power tone and sensation normal throughout.   Skin: Intact, no visible lesions or rashes.  Psych: Good eye contact, normal affect.  Memory intact, not anxious or depressed appearing.    Impression & Recommendations:  Problem # 1:  KNEE, ARTHRITIS, DEGEN./OSTEO (ICD-715.96) Assessment  Deteriorated  His updated medication list for this problem includes:    Tramadol Hcl 50 Mg Tabs (Tramadol hcl) .Marland Kitchen... Take 1 tablet by mouth two times a day  Problem # 2:  PROSTATE CANCER (ICD-185) Assessment: Comment Only treated with hormonal therapy  Problem # 3:  NICOTINE ADDICTION (ICD-305.1) Assessment: Unchanged  Encouraged smoking cessation and discussed different methods for smoking cessation.   Problem # 4:  HYPERTENSION (ICD-401.9) Assessment: Unchanged  His updated medication list for this problem includes:    Benazepril Hcl 20 Mg Tabs (Benazepril hcl) ..... One tab by mouth two times a day    Metoprolol Tartrate 50 Mg Tabs (Metoprolol tartrate) ..... One tab by mouth two times a day  Orders: T-Basic Metabolic Panel [phone])  BP today: 120/80 Prior BP: 120/80 (06/22/2009)  Labs Reviewed: K+: 5.2 (06/12/2009) Creat: : 1.34 (06/12/2009)   Chol: 146 (12/12/2008)   HDL: 63 (12/12/2008)   LDL: 61 (12/12/2008)   TG: 109 (12/12/2008)  Problem # 5:  HYPERLIPIDEMIA (ICD-272.4) Assessment: Comment Only  His updated medication list for this problem includes:    Lovastatin 40 Mg Tabs (Lovastatin) ..... One tab by mouth at bedtime  discontinue lipitor  Orders: T-Hepatic Function [id number]) T-Lipid Profile [id number])  Labs Reviewed: SGOT: 30 (06/12/2009)   SGPT: 27 (06/12/2009)   HDL:63 (12/12/2008), 46 (06/29/2008)  LDL:61 (12/12/2008), 62 (06/29/2008)  Chol:146 (12/12/2008), 126 (06/29/2008)  Trig:109 (12/12/2008), 92 (06/29/2008)  Complete Medication List: 1)  Allopurinol 300 Mg Tabs (Allopurinol) .... One tab by mouth once daily 2)  Benazepril Hcl 20 Mg Tabs (Benazepril hcl) .... One tab by mouth two times a day 3)  Centrum Silver Tabs (Multiple vitamins-minerals) .... One tab by mouth once daily 4)  Metoprolol Tartrate 50 Mg Tabs (Metoprolol tartrate) .... One tab by mouth two times a day 5)  Advair Diskus 100-50 Mcg/dose Misc (Fluticasone-salmeterol)  .... One puff bid 6)  Tramadol Hcl 50 Mg Tabs (Tramadol hcl) .... Take 1 tablet by mouth two times a day 7)  Omeprazole 40 Mg Cpdr (Omeprazole) .... One cap by mouth qd 8)  Lovastatin 40 Mg Tabs (Lovastatin) .... One tab by mouth at bedtime  discontinue lipitor  Other Orders: T- Hemoglobin A1C ([id number])  Patient Instructions: 1)  Please schedule a follow-up appointment in 3 months. 2)  You need to lose weight. Consider a lower calorie diet and regular exercise.  3)  BMP prior to visit, ICD-9: 4)  Hepatic Panel prior to visit, ICD-9: 5)  Lipid Panel prior to visit, ICD-9:  fasting  6)  HbgA1C prior to visit, ICD-9: 7)  Your blood pressure is great. 8)  Tobacco is very bad for your health and your loved ones! You Should stop smoking!. 9)  Stop Smoking Tips: Choose a Quit date. Cut down before the Quit date. decide what you will do as a substitute when you feel the urge to smoke(gum,toothpick,exercise). 10)  We wuill contact [employer] to lv only nocturnal oxygen with a humidifier Prescriptions: METOPROLOL TARTRATE 50  MG  TABS (METOPROLOL TARTRATE) one tab by mouth two times a day  #60 Tablet x 3   Entered by:   Everitt Amber LPN   Authorized by:   [name] MD   Signed by:   Everitt Amber LPN on 16/03/9603   Method used:   Electronically to        [facility] (retail)       [address]       [place], [place]  [address]       Ph: [phone]       Fax: [phone]   RxID:   [id number] BENAZEPRIL HCL 20 MG  TABS (BENAZEPRIL HCL) one tab by mouth two times a day  #60 Tablet x 3   Entered by:   Everitt Amber LPN   Authorized by:   [name] MD   Signed by:   Everitt Amber LPN on 04/20/2535   Method used:   Electronically to        [facility] (retail)       [address]       [place], [place]  [address]       Ph: [phone]       Fax: [phone]   RxID:   [id number]

## 2010-07-26 NOTE — Progress Notes (Signed)
Summary: med  Phone Note Call from Patient   Summary of Call: dr. Rito Ehrlich gave him a shot and rx him to take calium- the shot will make his bone sbrittle what type and should he take it   myra called  call back at [phone] Initial call taken by: Lind Guest,  April 17, 2010 2:45 PM  Follow-up for Phone Call        calcium with vit D 1200mg /1000IU one daily this is an otc gel capsule Follow-up by: [name] MD,  April 18, 2010 12:58 PM  Additional Follow-up for Phone Call Additional follow up Details #1::        RETURNED CALL, LEFT MESSAGE Additional Follow-up by: Adella Hare LPN,  April 20, 2010 3:26 PM    Additional Follow-up for Phone Call Additional follow up Details #2::    returned call, left message Follow-up by: Adella Hare LPN,  April 23, 2010 11:42 AM

## 2010-07-26 NOTE — Progress Notes (Signed)
  Phone Note Call from Patient   Summary of Call: over the weekend when he bent over to tie his shoes his nose just pours blood. (Happened 3 different times) Only when he bends over.Marland Kitchen  He thinks it was from the oxygen. Daughter wanted to know if you thought that could cause?  Hasn't done it anymore since yesterday morning because he states he hasn't bent over since then. Do you want him to make appointment? Daughter didn't want to bring him in unless you thought it was absolutely neccessary. Initial call taken by: Everitt Amber LPN,  August 21, 2009 11:29 AM  Follow-up for Phone Call        The oxygen can dry out the inside of his nose & this could cause nosebleeds.  If his nose is feeling dry he could try putting some vaseline or triple antibiotics ointment in his nose to  help.  If the problem persists, yes will need an appt to make sure it's not from something else.   Follow-up by: Esperanza Sheets PA,  August 21, 2009 11:39 AM  Additional Follow-up for Phone Call Additional follow up Details #1::        daughter aware Additional Follow-up by: Adella Hare LPN,  August 21, 2009 11:42 AM

## 2010-07-26 NOTE — Letter (Signed)
Summary: INTERDISCIPLINARY PROGRESS REPORT  INTERDISCIPLINARY PROGRESS REPORT   Imported By: Lind Guest 06/26/2009 09:37:00  _____________________________________________________________________  External Attachment:    Type:   Image     Comment:   External Document

## 2010-07-26 NOTE — Miscellaneous (Signed)
Summary: [name]   Imported By: Lind Guest 06/07/2010 13:43:10  _____________________________________________________________________  External Attachment:    Type:   Image     Comment:   External Document

## 2010-07-26 NOTE — Progress Notes (Signed)
  Phone Note From Pharmacy   Caller: [facility]  [address] Summary of Call: needs alternative for lipitor per insurance Initial call taken by: Lilyan Gilford LPN,  August 07, 2009 3:28 PM  Follow-up for Phone Call        asdvise and erx lovastatin 40mg  Take 1 tab by mouth at bedtime #30 refill 4 Follow-up by: [name] MD,  August 07, 2009 5:04 PM  Additional Follow-up for Phone Call Additional follow up Details #1::        rx changed and daughter aware Additional Follow-up by: Lilyan Gilford LPN,  August 08, 2009 3:19 PM    New/Updated Medications: LOVASTATIN 40 MG TABS (LOVASTATIN) one tab by mouth at bedtime  discontinue lipitor Prescriptions: LOVASTATIN 40 MG TABS (LOVASTATIN) one tab by mouth at bedtime  discontinue lipitor  #30 x 4   Entered by:   Lilyan Gilford LPN   Authorized by:   [name] MD   Signed by:   Lilyan Gilford LPN on 66/11/3014   Method used:   Electronically to        [facility] [address] (retail)       [address]       [place], [place]  [address]       Ph: [phone]       Fax: [phone]   RxID:   [id number]

## 2010-07-26 NOTE — Letter (Signed)
Summary: Xray order  Xray order   Imported By: Cammie Sickle 06/27/2009 11:24:50  _____________________________________________________________________  External Attachment:    Type:   Image     Comment:   External Document

## 2010-07-26 NOTE — Progress Notes (Signed)
Summary: PHONE #  Phone Note Call from Patient   Summary of Call: CALL [name] BACK AND GIVE HER # TO HEAR[facility]ID PLAC[facility] WHAT DR WAS TALKING ABOUT Initial call taken by: Lind Guest,  December 21, 2009 1:42 PM  Follow-up for Phone Call        pls call and give the # for free hearing aids in [place] and [place] if you have one fopre there as well Follow-up by: [name] MD,  December 21, 2009 2:00 PM  Additional Follow-up for Phone Call Additional follow up Details #1::        Gave [name] the number Additional Follow-up by: Everitt Amber LPN,  December 21, 2009 2:50 PM

## 2010-07-26 NOTE — Assessment & Plan Note (Signed)
Summary: F UP   Vital Signs:  Patient profile:   77 year old male Height:      71 inches Weight:      234 pounds BMI:     32.75 O2 Sat:      94 % Pulse rate:   87 / minute Pulse rhythm:   regular Resp:     16 per minute BP sitting:   140 / 80  (right arm) Cuff size:   regular  Vitals Entered By: Everitt Amber LPN (March 09, 2010 8:03 AM) CC: Follow up chronic problems   CC:  Follow up chronic problems.  History of Present Illness: Reports  that he has been doing well.  Denies recent fever or chills. Denies sinus pressure, nasal congestion , ear pain or sore throat. Denies chest congestion, or cough productive of sputum. Denies chest pain, palpitations, PND, orthopnea or leg swelling. Denies abdominal pain, nausea, vomitting, diarrhea or constipation. Denies change in bowel movements or bloody stool. Denies dysuria , frequency, incontinence or hesitancy. continued  joint pain, and  reduced mobility, primarily affecting the knees. Denies headaches, vertigo, seizures. Denies depression, anxiety or insomnia. Denies  rash, lesions, or itch.     Current Medications (verified): 1)  Allopurinol 300 Mg  Tabs (Allopurinol) .... One Tab By Mouth Once Daily 2)  Benazepril Hcl 20 Mg  Tabs (Benazepril Hcl) .... One Tab By Mouth Two Times A Day 3)  Centrum Silver   Tabs (Multiple Vitamins-Minerals) .... One Tab By Mouth Once Daily 4)  Metoprolol Tartrate 50 Mg  Tabs (Metoprolol Tartrate) .... One Tab By Mouth Two Times A Day 5)  Advair Diskus 100-50 Mcg/dose Misc (Fluticasone-Salmeterol) .... One Puff Bid 6)  Tramadol Hcl 50 Mg Tabs (Tramadol Hcl) .... Take 1 Tablet By Mouth Two Times A Day 7)  Omeprazole 40 Mg Cpdr (Omeprazole) .... One Cap By Mouth Qd 8)  Lovastatin 40 Mg Tabs (Lovastatin) .... One Tab By Mouth At Bedtime  Discontinue Lipitor  Allergies (verified): 1)  ! Asa  Review of Systems      See HPI General:  Complains of weakness. Eyes:  Complains of vision  loss-both eyes; corrective lenses. MS:  Complains of joint pain, low back pain, mid back pain, and stiffness. Endo:  Denies excessive thirst and heat intolerance. Heme:  Denies abnormal bruising and bleeding. Allergy:  Denies hives or rash and itching eyes.  Physical Exam  General:  Well-developed,overweight,in no acute distress; alert,appropriate and cooperative throughout examination HEENT: No facial asymmetry,  EOMI, No sinus tenderness, TM's Clear, oropharynx  pink and moist.   Chest: Clear to auscultation bilaterally. Decreased air entry bilaterally. CVS: S1, S2, systolic murmur, No S3. Irregularly , irregular heart rate  Abd: Soft, Nontender.  ZO:[id number]  ROM spine, hips, shoulders and knees.  Ext: No edema.   CNS: CN 2-12 intact, power tone and sensation normal throughout.   Skin: Intact, no visible lesions or rashes.  Psych: Good eye contact, normal affect.  Memory intact, not anxious or depressed appearing.    Impression & Recommendations:  Problem # 1:  IMPAIRED FASTING GLUCOSE (ICD-790.21) Assessment Comment Only  Orders: T- Hemoglobin A1C ([id number])  Labs Reviewed: Creat: 0.83 (09/19/2009)    Pt advised to reduce carbohydrate intake, espescially sweets, and to start regular physical activitas able, , at least 30 minutes 5 days weekly, to enable weight loss, and reduce the risk of becoming diabetic   Problem # 2:  KNEE, ARTHRITIS, DEGEN./OSTEO (NFA-213.08) Assessment: Deteriorated  His  updated medication list for this problem includes:    Tramadol Hcl 50 Mg Tabs (Tramadol hcl) .Marland Kitchen... Take 1 tablet by mouth two times a day  Problem # 3:  NICOTINE ADDICTION (ICD-305.1) Assessment: Unchanged  Encouraged smoking cessation and discussed different methods for smoking cessation.   Problem # 4:  OBESITY (ICD-278.00) Assessment: Deteriorated  Ht: 71 (03/09/2010)   Wt: 234 (03/09/2010)   BMI: 32.75 (03/09/2010) therapeutic lifestyle change discussed and  encouraged  Problem # 5:  HYPERTENSION (ICD-401.9) Assessment: Deteriorated  His updated medication list for this problem includes:    Benazepril Hcl 20 Mg Tabs (Benazepril hcl) ..... One tab by mouth two times a day    Metoprolol Tartrate 50 Mg Tabs (Metoprolol tartrate) ..... One tab by mouth two times a day  BP today: 140/80 Prior BP: 130/80 (12/21/2009)  Labs Reviewed: K+: 4.3 (09/19/2009) Creat: : 0.83 (09/19/2009)   Chol: 128 (09/19/2009)   HDL: 48 (09/19/2009)   LDL: 62 (09/19/2009)   TG: 90 (09/19/2009)  Problem # 6:  HYPERLIPIDEMIA (ICD-272.4) Assessment: Comment Only  His updated medication list for this problem includes:    Lovastatin 40 Mg Tabs (Lovastatin) ..... One tab by mouth at bedtime  discontinue lipitor Low fat diet discussed and encouraged, and literature also given   Orders: T-Lipid Profile [id number]  Labs Reviewed: SGOT: 32 (09/19/2009)   SGPT: 28 (09/19/2009)   HDL:48 (09/19/2009), 63 (12/12/2008)  LDL:62 (09/19/2009), 61 (12/12/2008)  Chol:128 (09/19/2009), 146 (12/12/2008)  Trig:90 (09/19/2009), 109 (12/12/2008)  Complete Medication List: 1)  Allopurinol 300 Mg Tabs (Allopurinol) .... One tab by mouth once daily 2)  Benazepril Hcl 20 Mg Tabs (Benazepril hcl) .... One tab by mouth two times a day 3)  Centrum Silver Tabs (Multiple vitamins-minerals) .... One tab by mouth once daily 4)  Metoprolol Tartrate 50 Mg Tabs (Metoprolol tartrate) .... One tab by mouth two times a day 5)  Advair Diskus 100-50 Mcg/dose Misc (Fluticasone-salmeterol) .... One puff bid 6)  Tramadol Hcl 50 Mg Tabs (Tramadol hcl) .... Take 1 tablet by mouth two times a day 7)  Omeprazole 40 Mg Cpdr (Omeprazole) .... One cap by mouth qd 8)  Lovastatin 40 Mg Tabs (Lovastatin) .... One tab by mouth at bedtime  discontinue lipitor  Other Orders: T-Hepatic Function [id number]) T-TSH [id number]) Influenza Vaccine MCR [id number])  Patient Instructions: 1)  Please schedule a  follow-up appointment in 4 months. 2)  you look very well at this time. 3)  Fasting labs needed asap. 4)  no med changes.  Prescriptions: LOVASTATIN 40 MG TABS (LOVASTATIN) one tab by mouth at bedtime  discontinue lipitor  #30 Tablet x 3   Entered by:   Everitt Amber LPN   Authorized by:   [name] MD   Signed by:   Everitt Amber LPN on 96/29/5284   Method used:   Electronically to        [facility] (retail)       [address]       [place], [place]  [address]       Ph: [phone]       Fax: [phone]   RxID:   [id number] OMEPRAZOLE 40 MG CPDR (OMEPRAZOLE) one cap by mouth qd  #30 Capsule x 3   Entered by:   Everitt Amber LPN   Authorized by:   [name] MD   Signed by:   Everitt Amber LPN on 41/66/0630   Method  used:   Electronically to        [facility] (retail)       [address]       [place], [place]  [address]       Ph: [phone]       Fax: [phone]   RxID:   [id number] TRAMADOL HCL 50 MG TABS (TRAMADOL HCL) Take 1 tablet by mouth two times a day  #40 Tablet x 2   Entered by:   Everitt Amber LPN   Authorized by:   [name] MD   Signed by:   Everitt Amber LPN on 84/13/2440   Method used:   Electronically to        [facility] (retail)       [address]       [place], [place]  [address]       Ph: [phone]       Fax: [phone]   RxID:   [id number] METOPROLOL TARTRATE 50 MG  TABS (METOPROLOL TARTRATE) one tab by mouth two times a day  #60 Tablet x 3   Entered by:   Everitt Amber LPN   Authorized by:   [name] MD   Signed by:   Everitt Amber LPN on 30/16/0109   Method used:   Electronically to        [facility] (retail)       [address]       [place], [place]  [address]       Ph: [phone]       Fax: [phone]   RxID:   [id number] BENAZEPRIL HCL 20 MG  TABS (BENAZEPRIL  HCL) one tab by mouth two times a day  #60 Tablet x 3   Entered by:   Everitt Amber LPN   Authorized by:   [name] MD   Signed by:   Everitt Amber LPN on 48/54/6270   Method used:   Electronically to        [facility] (retail)       [address]       [place], [place]  [address]       Ph: [phone]       Fax: [phone]   RxID:   [id number]    Influenza Vaccine    Vaccine Type: Fluvax MCR    Site: right deltoid    Mfr: novartis     Dose: 0.5 ml    Route: IM    Given by: Everitt Amber LPN    Exp. Date: 10/2010    Lot #: [id number]

## 2010-07-26 NOTE — Miscellaneous (Signed)
Summary: OXYGEN  OXYGEN   Imported By: Lind Guest 07/19/2009 15:01:57  _____________________________________________________________________  External Attachment:    Type:   Image     Comment:   External Document

## 2010-07-26 NOTE — Assessment & Plan Note (Signed)
Summary: office visit   Vital Signs:  Patient profile:   77 year old male Height:      71 inches Weight:      234 pounds BMI:     32.75 O2 Sat:      80 % Pulse rate:   90 / minute Pulse rhythm:   regular Resp:     18 per minute BP sitting:   140 / 74  (left arm)  Vitals Entered By: Everitt Amber LPN (July 10, 2010 8:09 AM) CC: has been very sick, short of breath. o2 in 80's and has been in the 70's. Has been in the bed for 3 weeks, no appetite. Has almost quit smoking   CC:  has been very sick, short of breath. o2 in 80's and has been in the 70's. Has been in the bed for 3 weeks, and no appetite. Has almost quit smoking.  History of Present Illness: 3 week h/o reduced apetitie, decreased mobility, and generalised malaise. Has stoppedsmoking in the past 3 weeks. Refuses Ed eval, has oxygen at home which he is using moor often but no portable oxygen.Sent that back. Denies fever, chills, malodoros urine , productive cough   Current Medications (verified): 1)  Allopurinol 300 Mg  Tabs (Allopurinol) .... One Tab By Mouth Once Daily 2)  Benazepril Hcl 20 Mg  Tabs (Benazepril Hcl) .... One Tab By Mouth Two Times A Day 3)  Centrum Silver   Tabs (Multiple Vitamins-Minerals) .... One Tab By Mouth Once Daily 4)  Metoprolol Tartrate 50 Mg  Tabs (Metoprolol Tartrate) .... One Tab By Mouth Two Times A Day 5)  Advair Diskus 100-50 Mcg/dose Misc (Fluticasone-Salmeterol) .... One Puff Bid 6)  Tramadol Hcl 50 Mg Tabs (Tramadol Hcl) .... Take 1 Tablet By Mouth Two Times A Day 7)  Omeprazole 40 Mg Cpdr (Omeprazole) .... One Cap By Mouth Qd 8)  Lovastatin 40 Mg Tabs (Lovastatin) .... One Tab By Mouth At Bedtime  Discontinue Lipitor  Allergies (verified): 1)  ! Asa  Past History:  Past medical, surgical, family and social histories (including risk factors) reviewed, and no changes noted (except as noted below).  Past Medical History: Reviewed history from 05/10/2009 and no changes  required. IMPAIRED GLUCOSE TOLERANCE (ICD-271.3) ATRIAL FIBRILLATION, CHRONIC (ICD-427.31) VALVULAR HEART DISEASE (ICD-424.90) ELEVATED PROSTATE SPECIFIC ANTIGEN (ICD-790.93) OBESITY (ICD-278.00) OSTEOARTHRITIS (ICD-715.90) HYPERLIPIDEMIA (ICD-272.4) HYPERTENSION (ICD-401.9) Reflux prostate cancer  Past Surgical History: Reviewed history from 12/17/2007 and no changes required. Left knee surgery Ulcer surgery Bladder surgery MVR and CABG RCA stent Right eye surgery  Family History: Reviewed history from 09/18/2009 and no changes required.  MOTHER DECEASED  STROKE FATHER DECASED CAUSE UNKNOWN ONE SISTER LIVING HEALTHY ONE BROTHER died at age 26 in 54 FOUR BROTHERS  DECEASED / TWO CANCER / ONE HEART ATTACK / ONE STROKE  Social History: Reviewed history from 09/18/2009 and no changes required. Retired Quit in 2012 Alcohol use-no Drug use-no Widow 3 children living, one son decceased at age 66 due drunk driving  Review of Systems      See HPI General:  Complains of chills and fatigue. Eyes:  Complains of vision loss-both eyes. CV:  Complains of fatigue. Resp:  Complains of cough; denies sputum productive. GI:  Denies abdominal pain, constipation, diarrhea, nausea, and vomiting. GU:  Denies dysuria and erectile dysfunction. MS:  Complains of joint pain, low back pain, mid back pain, and stiffness. Heme:  Complains of abnormal bruising; denies bleeding. Allergy:  Denies hives or rash, itching eyes,  persistent infections, and seasonal allergies.  Physical Exam  General:  pleasant elderly male,ill appearingalert,appropriate and cooperative throughout examination HEENT: No facial asymmetry,  EOMI, No sinus tenderness, TM's Clear, oropharynx  pink and moist.   Chest: decreased air entry,few cracklesin  the bases CVS: S1, S2, No murmurs, No S3.   Abd: Soft, Nontender. Obese MS: decreased  ROM spine, hips, shoulders and knees.  Ext: No edema.   CNS: CN 2-12 intact,  power tone and sensation normal throughout.   Skin: Intact, no visible lesions or rashes.  Psych: Good eye contact, normal affect.  Memory intact, not anxious or depressed appearing.    Impression & Recommendations:  Problem # 1:  COPD (ICD-496) Assessment Deteriorated  His updated medication list for this problem includes:    Advair Diskus 100-50 Mcg/dose Misc (Fluticasone-salmeterol) ..... One puff bid  Orders: CXR- 2view (CXR)  Problem # 2:  HYPERTENSION (ICD-401.9) Assessment: Unchanged  Reviewed preventive care protocols, scheduled due services, and updated immunizations.  His updated medication list for this problem includes:    Benazepril Hcl 20 Mg Tabs (Benazepril hcl) ..... One tab by mouth two times a day    Metoprolol Tartrate 50 Mg Tabs (Metoprolol tartrate) ..... One tab by mouth two times a day  BP today: 140/74 Prior BP: 140/80 (03/09/2010)  Labs Reviewed: K+: 4.3 (09/19/2009) Creat: : 0.83 (09/19/2009)   Chol: 153 (03/21/2010)   HDL: 55 (03/21/2010)   LDL: 75 (03/21/2010)   TG: 116 (03/21/2010)  Problem # 3:  OSTEOARTHRITIS (ICD-715.90) Assessment: Deteriorated  His updated medication list for this problem includes:    Tramadol Hcl 50 Mg Tabs (Tramadol hcl) .Marland Kitchen... Take 1 tablet by mouth two times a day  Problem # 4:  HYPERLIPIDEMIA (ICD-272.4) Assessment: Unchanged  His updated medication list for this problem includes:    Lovastatin 40 Mg Tabs (Lovastatin) ..... One tab by mouth at bedtime  discontinue lipitor  Orders: Medicare Electronic Prescription [id number]) T-Hepatic Function [id number]) T-Lipid Profile [phone])  Labs Reviewed: SGOT: 45 (03/21/2010)   SGPT: 37 (03/21/2010)   HDL:55 (03/21/2010), 48 (09/19/2009)  LDL:75 (03/21/2010), 62 (09/19/2009)  Chol:153 (03/21/2010), 128 (09/19/2009)  Trig:116 (03/21/2010), 90 (09/19/2009)  Problem # 5:  PNEUMONIA, ORGANISM UNSPECIFIED (ICD-486) Assessment: Comment Only  His updated medication  list for this problem includes:    Penicillin V Potassium 500 Mg Tabs (Penicillin v potassium) .Marland Kitchen... Take 1 tablet by mouth three times a day  Complete Medication List: 1)  Allopurinol 300 Mg Tabs (Allopurinol) .... One tab by mouth once daily 2)  Benazepril Hcl 20 Mg Tabs (Benazepril hcl) .... One tab by mouth two times a day 3)  Centrum Silver Tabs (Multiple vitamins-minerals) .... One tab by mouth once daily 4)  Metoprolol Tartrate 50 Mg Tabs (Metoprolol tartrate) .... One tab by mouth two times a day 5)  Advair Diskus 100-50 Mcg/dose Misc (Fluticasone-salmeterol) .... One puff bid 6)  Tramadol Hcl 50 Mg Tabs (Tramadol hcl) .... Take 1 tablet by mouth two times a day 7)  Omeprazole 40 Mg Cpdr (Omeprazole) .... One cap by mouth qd 8)  Lovastatin 40 Mg Tabs (Lovastatin) .... One tab by mouth at bedtime  discontinue lipitor 9)  Penicillin V Potassium 500 Mg Tabs (Penicillin v potassium) .... Take 1 tablet by mouth three times a day 10)  Tessalon Perles 100 Mg Caps (Benzonatate) .... Take 1 capsule by mouth three times a day  Other Orders: T-Basic Metabolic Panel [id number]) T-CBC w/Diff [id number]) T-TSH [id number]) T- Hemoglobin A1C (  [id number])  Patient Instructions: 1)  Please schedule a follow-up appointment in 1 month. 2)  Congrats on smoking cessation. 3)  You ABSOLUTELY need to use oxygen continually at 2liters/min 4)  You need ABG 5)  BMP prior to visit, ICD-9: 6)  Hepatic Panel prior to visit, ICD-9: 7)  Lipid Panel prior to visit, ICD-9:   today 8)  TSH prior to visit, ICD-9: 9)  CBC w/ Diff prior to visit, ICD-9: 10)  HbgA1C prior to visit, ICD-9: 11)  ABG on 2 liters of oxygen. 12)  pLs use the incentive spirometer at least 4 tiimes daily. 13)  We are ordering potrable oxygen for you today 14)  CXR today Prescriptions: TESSALON PERLES 100 MG CAPS (BENZONATATE) Take 1 capsule by mouth three times a day  #30 x 0   Entered and Authorized by:   [name]  MD   Signed by:   [name] MD on 07/10/2010   Method used:   Electronically to        [facility] (retail)       [address]       [place], [place]  [address]       Ph: [phone]       Fax: [phone]   RxID:   [id number] PENICILLIN V POTASSIUM 500 MG TABS (PENICILLIN V POTASSIUM) Take 1 tablet by mouth three times a day  #30 x 0   Entered and Authorized by:   [name] MD   Signed by:   [name] MD on 07/10/2010   Method used:   Electronically to        [facility] (retail)       [address]       [place], [place]  [address]       Ph: [phone]       Fax: [phone]   RxID:   [id number] LOVASTATIN 40 MG TABS (LOVASTATIN) one tab by mouth at bedtime  discontinue lipitor  #30 Tablet x 0   Entered by:   Adella Hare LPN   Authorized by:   [name] MD   Signed by:   Adella Hare LPN on 16/06/930   Method used:   Electronically to        [facility] (retail)       [address]       [place], [place]  [address]       Ph: [phone]       Fax: [phone]   RxID:   [id number] OMEPRAZOLE 40 MG CPDR (OMEPRAZOLE) one cap by mouth qd  #30 Capsule x 0   Entered by:   Adella Hare LPN   Authorized by:   [name] MD   Signed by:   Adella Hare LPN on 54/62/7035   Method used:   Electronically to        [facility] (retail)       [address]       [place], [place]  [address]       Ph: [phone]       Fax: [phone]   RxID:   [id number] TRAMADOL HCL 50 MG TABS (TRAMADOL HCL) Take 1 tablet by mouth two times a day  #40 Tablet x 0  Entered by:   Adella Hare LPN   Authorized by:   [name] MD   Signed by:   Adella Hare LPN on 16/03/9603   Method used:   Electronically to        [facility] [address] (retail)       [address]       [place], [place]  [address]       Ph: [phone]       Fax: [phone]   RxID:   [id number] METOPROLOL TARTRATE 50 MG  TABS (METOPROLOL TARTRATE) one tab by mouth two times a day  #60 Tablet x 0   Entered by:   Adella Hare LPN   Authorized by:   [name] MD   Signed by:   Adella Hare LPN on 04/20/2535   Method used:   Electronically to        [facility] (retail)       [address]       [place], [place]  [address]       Ph: [phone]       Fax: [phone]   RxID:   [id number] BENAZEPRIL HCL 20 MG  TABS (BENAZEPRIL HCL) one tab by mouth two times a day  #60 Tablet x 0   Entered by:   Adella Hare LPN   Authorized by:   [name] MD   Signed by:   Adella Hare LPN on 32/35/5732   Method used:   Electronically to        [facility] [address] (retail)       [address]       [place], [place]  [address]       Ph: [phone]       Fax: [phone]   RxID:   [id number] ALLOPURINOL 300 MG  TABS (ALLOPURINOL) one tab by mouth once daily  #30 Tablet x 0   Entered by:   Adella Hare LPN   Authorized by:   [name] MD   Signed by:   Adella Hare LPN on 35/00/9381   Method used:   Electronically to        [facility] [address] (retail)       [address]       [place], [place]  [address]       Ph: [phone]       Fax: [phone]   RxID:   [id number]    Orders Added: 1)  Est. Patient Level IV [15400] 2)  CXR- 2view [CXR] 3)  Medicare Electronic Prescription [G8553] 4)  T-Basic Metabolic Panel [[id number]] 5)  T-Hepatic Function [80076-22960] 6)  T-Lipid Profile [80061-22930] 7)  T-CBC w/Diff [[id number]] 8)  T-TSH [[id number]] 9)  T- Hemoglobin A1C [83036-23375]

## 2010-07-26 NOTE — Letter (Signed)
Summary: DR. Dennie Maizes  DR. Dennie Maizes   Imported By: Lind Guest 04/23/2010 09:43:38  _____________________________________________________________________  External Attachment:    Type:   Image     Comment:   External Document

## 2010-07-26 NOTE — Progress Notes (Signed)
Summary: dr. Dennie Maizes  dr. Dennie Maizes   Imported By: Lind Guest 09/22/2009 08:29:43  _____________________________________________________________________  External Attachment:    Type:   Image     Comment:   External Document

## 2010-07-26 NOTE — Medication Information (Signed)
Summary: Tax adviser   Imported By: Lind Guest 06/28/2009 11:09:25  _____________________________________________________________________  External Attachment:    Type:   Image     Comment:   External Document

## 2010-07-26 NOTE — Miscellaneous (Signed)
Summary: Home Care Report  Home Care Report   Imported By: Lind Guest 08/07/2009 16:06:13  _____________________________________________________________________  External Attachment:    Type:   Image     Comment:   External Document

## 2010-07-26 NOTE — Progress Notes (Signed)
  Phone Note Other Incoming   Caller: dr Mujtaba Bollig Summary of Call: advise pt he qualifies for [facility], fax note from pT as well as last OV and a script to [facility] Initial call taken by: [name] MD,  July 03, 2009 1:17 PM  Follow-up for Phone Call        called patient, left message Follow-up by: Worthy Keeler LPN,  July 03, 2009 1:34 PM  Additional Follow-up for Phone Call Additional follow up Details #1::        info sent, patient daughter aware Additional Follow-up by: Worthy Keeler LPN,  July 03, 2009 2:36 PM

## 2010-07-26 NOTE — Letter (Signed)
Summary: [facility]  [facility]   Imported By: Lind Guest 08/15/2009 08:38:54  _____________________________________________________________________  External Attachment:    Type:   Image     Comment:   External Document

## 2010-07-26 NOTE — Progress Notes (Signed)
Summary: [facility]   Imported By: Lind Guest 06/26/2009 09:39:34  _____________________________________________________________________  External Attachment:    Type:   Image     Comment:   External Document

## 2010-07-26 NOTE — Progress Notes (Signed)
[facility]  [facility]   Imported By: Lind Guest 07/28/2009 09:10:12  _____________________________________________________________________  External Attachment:    Type:   Image     Comment:   External Document

## 2010-07-26 NOTE — Progress Notes (Signed)
[facility]: [facility]  [facility]   Imported By: Lind Guest 02/08/2010 08:40:06  _____________________________________________________________________  External Attachment:    Type:   Image     Comment:   External Document

## 2010-07-26 NOTE — Letter (Signed)
Summary: PORTABLE OXYGEN  PORTABLE OXYGEN   Imported By: Lind Guest 07/10/2010 13:27:23  _____________________________________________________________________  External Attachment:    Type:   Image     Comment:   External Document

## 2010-07-26 NOTE — Medication Information (Signed)
Summary: Tax adviser   Imported By: Lind Guest 07/06/2009 08:39:47  _____________________________________________________________________  External Attachment:    Type:   Image     Comment:   External Document

## 2010-07-26 NOTE — Letter (Signed)
Summary: [facility]   [facility] center   Imported By: Lind Guest 07/18/2010 17:19:44  _____________________________________________________________________  External Attachment:    Type:   Image     Comment:   External Document

## 2010-07-26 NOTE — Progress Notes (Signed)
Summary: [name]  [name]   Imported By: Lind Guest 01/08/2010 13:56:34  _____________________________________________________________________  External Attachment:    Type:   Image     Comment:   External Document

## 2010-07-26 NOTE — Progress Notes (Signed)
Summary: POWER WHEELCHAIR  POWER WHEELCHAIR   Imported By: Lind Guest 07/06/2009 08:17:16  _____________________________________________________________________  External Attachment:    Type:   Image     Comment:   External Document

## 2010-07-26 NOTE — Letter (Signed)
Summary: [employer]IPMENT  POWER MOBILITY EQUIPMENT   Imported By: Lind Guest 07/26/2009 10:26:24  _____________________________________________________________________  External Attachment:    Type:   Image     Comment:   External Document

## 2010-07-26 NOTE — Assessment & Plan Note (Signed)
Summary: office visit   Vital Signs:  Patient profile:   77 year old male Height:      71 inches Weight:      228 pounds BMI:     31.91 O2 Sat:      89 % Pulse rate:   79 / minute Pulse rhythm:   regular Resp:     16 per minute BP sitting:   130 / 80  (left arm) Cuff size:   regular  Vitals Entered By: Everitt Amber LPN (December 21, 2009 1:00 PM) CC: Follow up chronic problems   CC:  Follow up chronic problems.  History of Present Illness: Reports  that he has been  doing well. Denies recent fever or chills. Denies sinus pressure, nasal congestion , ear pain or sore throat. Denies chest congestion, or cough productive of sputum. Denies chest pain, palpitations, PND, orthopnea or leg swelling. Denies abdominal pain, nausea, vomitting, diarrhea or constipation. Denies change in bowel movements or bloody stool. Denies dysuria , frequency, incontinence or hesitancy. c/o  joint pain, and  reduced mobility. Denies headaches, vertigo, seizures. Denies depression, anxiety or insomnia. Denies  rash, lesions, or itch. He continues to smoke with no quit date in mind     Preventive Screening-Counseling & Management  Alcohol-Tobacco     Smoking Cessation Counseling: yes  Current Medications (verified): 1)  Allopurinol 300 Mg  Tabs (Allopurinol) .... One Tab By Mouth Once Daily 2)  Benazepril Hcl 20 Mg  Tabs (Benazepril Hcl) .... One Tab By Mouth Two Times A Day 3)  Centrum Silver   Tabs (Multiple Vitamins-Minerals) .... One Tab By Mouth Once Daily 4)  Metoprolol Tartrate 50 Mg  Tabs (Metoprolol Tartrate) .... One Tab By Mouth Two Times A Day 5)  Advair Diskus 100-50 Mcg/dose Misc (Fluticasone-Salmeterol) .... One Puff Bid 6)  Tramadol Hcl 50 Mg Tabs (Tramadol Hcl) .... Take 1 Tablet By Mouth Two Times A Day 7)  Omeprazole 40 Mg Cpdr (Omeprazole) .... One Cap By Mouth Qd 8)  Lovastatin 40 Mg Tabs (Lovastatin) .... One Tab By Mouth At Bedtime  Discontinue Lipitor  Allergies  (verified): 1)  ! Asa  Review of Systems      See HPI Eyes:  Complains of vision loss-both eyes; denies blurring and discharge. ENT:  Complains of decreased hearing. MS:  Complains of joint pain, low back pain, mid back pain, and stiffness; ambulates with a cane due to knee ds. Endo:  Denies cold intolerance, excessive urination, heat intolerance, and polyuria. Heme:  Denies abnormal bruising and bleeding. Allergy:  Denies hives or rash and itching eyes.  Physical Exam  General:  Well-developed,overweight,in no acute distress; alert,appropriate and cooperative throughout examination HEENT: No facial asymmetry,  EOMI, No sinus tenderness, TM's Clear, oropharynx  pink and moist.   Chest: Clear to auscultation bilaterally. Decreased air entry bilaterally. CVS: S1, S2, systolic murmur, No S3. Irregularly , irregular heart rate  Abd: Soft, Nontender.  ZO:[id number]  ROM spine, hips, shoulders and knees.  Ext: No edema.   CNS: CN 2-12 intact, power tone and sensation normal throughout.   Skin: Intact, no visible lesions or rashes.  Psych: Good eye contact, normal affect.  Memory intact, not anxious or depressed appearing.    Impression & Recommendations:  Problem # 1:  NICOTINE ADDICTION (ICD-305.1) Assessment Unchanged  Encouraged smoking cessation and discussed different methods for smoking cessation.   Problem # 2:  HYPERTENSION (ICD-401.9) Assessment: Unchanged  His updated medication list for this problem includes:  Benazepril Hcl 20 Mg Tabs (Benazepril hcl) ..... One tab by mouth two times a day    Metoprolol Tartrate 50 Mg Tabs (Metoprolol tartrate) ..... One tab by mouth two times a day  Orders: T-Basic Metabolic Panel [id number])  BP today: 130/80 Prior BP: 120/80 (09/18/2009)  Labs Reviewed: K+: 4.3 (09/19/2009) Creat: : 0.83 (09/19/2009)   Chol: 128 (09/19/2009)   HDL: 48 (09/19/2009)   LDL: 62 (09/19/2009)   TG: 90 (09/19/2009)  Problem # 3:   HYPERLIPIDEMIA (ICD-272.4) Assessment: Comment Only  His updated medication list for this problem includes:    Lovastatin 40 Mg Tabs (Lovastatin) ..... One tab by mouth at bedtime  discontinue lipitor  Orders: T-Hepatic Function ([id number]) T-Lipid Profile [id number])  Labs Reviewed: SGOT: 32 (09/19/2009)   SGPT: 28 (09/19/2009)   HDL:48 (09/19/2009), 63 (12/12/2008)  LDL:62 (09/19/2009), 61 (12/12/2008)  Chol:128 (09/19/2009), 146 (12/12/2008)  Trig:90 (09/19/2009), 109 (12/12/2008)  Problem # 4:  OBESITY (ICD-278.00) Assessment: Improved  Ht: 71 (12/21/2009)   Wt: 228 (12/21/2009)   BMI: 31.91 (12/21/2009)  Problem # 5:  IMPAIRED FASTING GLUCOSE (ICD-790.21) Assessment: Improved  Orders: T- Hemoglobin A1C ([id number])improved, pt applauded on this an encouraged to continue in this trend  Complete Medication List: 1)  Allopurinol 300 Mg Tabs (Allopurinol) .... One tab by mouth once daily 2)  Benazepril Hcl 20 Mg Tabs (Benazepril hcl) .... One tab by mouth two times a day 3)  Centrum Silver Tabs (Multiple vitamins-minerals) .... One tab by mouth once daily 4)  Metoprolol Tartrate 50 Mg Tabs (Metoprolol tartrate) .... One tab by mouth two times a day 5)  Advair Diskus 100-50 Mcg/dose Misc (Fluticasone-salmeterol) .... One puff bid 6)  Tramadol Hcl 50 Mg Tabs (Tramadol hcl) .... Take 1 tablet by mouth two times a day 7)  Omeprazole 40 Mg Cpdr (Omeprazole) .... One cap by mouth qd 8)  Lovastatin 40 Mg Tabs (Lovastatin) .... One tab by mouth at bedtime  discontinue lipitor  Patient Instructions: 1)  Please schedule a follow-up appointment in 2 months. 2)  You need to lose weight. Consider a lower calorie diet and regular exercise.  3)  Tobacco is very bad for your health and your loved ones! You Should stop smoking!. 4)  Stop Smoking Tips: Choose a Quit date. Cut down before the Quit date. decide what you will do as a substitute when you feel the urge to  smoke(gum,toothpick,exercise). 5)  BMP prior to visit, ICD-9: 6)  Hepatic Panel prior to visit, ICD-9:  fasting in 2.5 months 7)  Lipid Panel prior to visit, ICD-9: 8)  HbgA1C prior to visit, ICD-9:

## 2010-07-26 NOTE — Progress Notes (Signed)
  Phone Note Other Incoming   Caller: [facility] Summary of Call: protonix- non formulary  alternative- omeprazole Initial call taken by: Worthy Keeler LPN,  July 10, 2009 2:02 PM  Follow-up for Phone Call        pls advise pt and pharmacy about alternative and erx omeprazole 40mg  one daily #30 refill x3 pls Follow-up by: [name] MD,  July 10, 2009 5:26 PM  Additional Follow-up for Phone Call Additional follow up Details #1::        Phone call completed Additional Follow-up by: Worthy Keeler LPN,  July 11, 2009 11:13 AM    Additional Follow-up for Phone Call Additional follow up Details #2::    patient daughter aware Follow-up by: Worthy Keeler LPN,  July 11, 2009 11:13 AM  New/Updated Medications: OMEPRAZOLE 40 MG CPDR (OMEPRAZOLE) one cap by mouth qd Prescriptions: OMEPRAZOLE 40 MG CPDR (OMEPRAZOLE) one cap by mouth qd  #30 x 3   Entered by:   Worthy Keeler LPN   Authorized by:   [name] MD   Signed by:   Worthy Keeler LPN on 16/03/9603   Method used:   Electronically to        [facility] (retail)       [address]       [place], [place]  [address]       Ph: [phone]       Fax: [phone]   RxID:   [id number]

## 2010-07-26 NOTE — Miscellaneous (Signed)
Summary: Home Care Report  Home Care Report   Imported By: Lind Guest 06/26/2009 14:13:09  _____________________________________________________________________  External Attachment:    Type:   Image     Comment:   External Document

## 2010-08-09 NOTE — Letter (Signed)
Summary: ca of prostate  ca of prostate   Imported By: Lind Guest 08/03/2010 10:43:26  _____________________________________________________________________  External Attachment:    Type:   Image     Comment:   External Document

## 2010-08-15 ENCOUNTER — Ambulatory Visit (INDEPENDENT_AMBULATORY_CARE_PROVIDER_SITE_OTHER): Payer: Medicare Other | Admitting: Family Medicine

## 2010-08-15 ENCOUNTER — Ambulatory Visit: Payer: Self-pay | Admitting: Family Medicine

## 2010-08-15 ENCOUNTER — Encounter: Payer: Self-pay | Admitting: Family Medicine

## 2010-08-15 DIAGNOSIS — I38 Endocarditis, valve unspecified: Secondary | ICD-10-CM

## 2010-08-15 DIAGNOSIS — J449 Chronic obstructive pulmonary disease, unspecified: Secondary | ICD-10-CM

## 2010-08-15 DIAGNOSIS — E785 Hyperlipidemia, unspecified: Secondary | ICD-10-CM

## 2010-08-28 ENCOUNTER — Telehealth: Payer: Self-pay | Admitting: Family Medicine

## 2010-08-30 NOTE — Assessment & Plan Note (Signed)
Summary: OFFICE VISIT   Vital Signs:  Patient profile:   77 year old male Height:      71 inches Weight:      234 pounds BMI:     32.75 O2 Sat:      83 % Pulse rate:   75 / minute Pulse rhythm:   regular Resp:     16 per minute BP sitting:   118 / 72  (left arm)  Vitals Entered By: Everitt Amber LPN (August 15, 2010 3:03 PM) CC: Follow up chronic problems   CC:  Follow up chronic problems.  History of Present Illness: Reports  that he has been  doing well. Denies recent fever or chills.He has recovered from pneumonia which he was recently treated for. Denies sinus pressure, nasal congestion , ear pain or sore throat. Denies chest congestion, or cough productive of sputum. Denies chest pain, palpitations, PND, orthopnea or leg swelling. Denies abdominal pain, nausea, vomitting, diarrhea or constipation. Denies change in bowel movements or bloody stool.  Denies headaches, vertigo, seizures. Denies depression, anxiety or insomnia. Denies  rash, lesions, or itch.    Preventive Screening-Counseling & Management  Alcohol-Tobacco     Smoking Cessation Counseling: yes  Current Medications (verified): 1)  Allopurinol 300 Mg  Tabs (Allopurinol) .... One Tab By Mouth Once Daily 2)  Benazepril Hcl 20 Mg  Tabs (Benazepril Hcl) .... One Tab By Mouth Two Times A Day 3)  Centrum Silver   Tabs (Multiple Vitamins-Minerals) .... One Tab By Mouth Once Daily 4)  Metoprolol Tartrate 50 Mg  Tabs (Metoprolol Tartrate) .... One Tab By Mouth Two Times A Day 5)  Advair Diskus 100-50 Mcg/dose Misc (Fluticasone-Salmeterol) .... One Puff Bid 6)  Tramadol Hcl 50 Mg Tabs (Tramadol Hcl) .... Take 1 Tablet By Mouth Two Times A Day 7)  Omeprazole 40 Mg Cpdr (Omeprazole) .... One Cap By Mouth Qd 8)  Lovastatin 40 Mg Tabs (Lovastatin) .... One Tab By Mouth At Bedtime  Discontinue Lipitor  Allergies (verified): 1)  ! Asa  Review of Systems      See HPI Eyes:  Complains of vision loss-both  eyes. ENT:  Complains of decreased hearing. MS:  Complains of joint pain and stiffness; severe arthritis of the knees with instability. Endo:  Denies excessive thirst and excessive urination. Heme:  Denies abnormal bruising and bleeding. Allergy:  Denies hives or rash and itching eyes.  Physical Exam  General:  Well-developed,well-nourished,in no acute distress; alert,appropriate and cooperative throughout examination HEENT: No facial asymmetry,  EOMI, No sinus tenderness, TM's Clear, oropharynx  pink and moist.   Chest: Clear to auscultation bilaterally.  CVS: S1, S2, No murmurs, No S3.   Abd: Soft, Nontender.  MS: decreased ROM spine, hips, shoulders and knees.  Ext: No edema.   CNS: CN 2-12 intact, power tone and sensation normal throughout.   Skin: Intact, no visible lesions or rashes.  Psych: Good eye contact, normal affect.  Memory intact, not anxious or depressed appearing.    Impression & Recommendations:  Problem # 1:  COPD (ICD-496) Assessment Improved  His updated medication list for this problem includes:    Advair Diskus 100-50 Mcg/dose Misc (Fluticasone-salmeterol) ..... One puff bid  Pulmonary Functions Reviewed: O2 sat: 83 (08/15/2010) , pt on oxygen and does not have it    Vaccines Reviewed: Pneumovax: Pneumovax (01/25/2004)   Flu Vax: Fluvax MCR (03/09/2010)  Problem # 2:  KNEE, ARTHRITIS, DEGEN./OSTEO (ICD-715.96) Assessment: Deteriorated  His updated medication list for this  problem includes:    Tramadol Hcl 50 Mg Tabs (Tramadol hcl) .Marland Kitchen... Take 1 tablet by mouth two times a day  Problem # 3:  PROSTATE CANCER (ICD-185) Assessment: Comment Only closesl;y followed by urology  Problem # 4:  HYPERTENSION (ICD-401.9) Assessment: Improved  His updated medication list for this problem includes:    Benazepril Hcl 20 Mg Tabs (Benazepril hcl) ..... One tab by mouth two times a day    Metoprolol Tartrate 50 Mg Tabs (Metoprolol tartrate) ..... One tab by  mouth two times a day  BP today: 118/72 Prior BP: 140/74 (07/10/2010)  Labs Reviewed: K+: 4.5 (07/10/2010) Creat: : 0.89 (07/10/2010)   Chol: 124 (07/10/2010)   HDL: 51 (07/10/2010)   LDL: 58 (07/10/2010)   TG: 74 (07/10/2010)  Problem # 5:  HYPERLIPIDEMIA (ICD-272.4) Assessment: Improved  His updated medication list for this problem includes:    Lovastatin 40 Mg Tabs (Lovastatin) ..... One tab by mouth at bedtime  discontinue lipitor  Labs Reviewed: SGOT: 24 (07/10/2010)   SGPT: 20 (07/10/2010)   HDL:51 (07/10/2010), 55 (03/21/2010)  LDL:58 (07/10/2010), 75 (03/21/2010)  Chol:124 (07/10/2010), 153 (03/21/2010)  Trig:74 (07/10/2010), 116 (03/21/2010)  Complete Medication List: 1)  Allopurinol 300 Mg Tabs (Allopurinol) .... One tab by mouth once daily 2)  Benazepril Hcl 20 Mg Tabs (Benazepril hcl) .... One tab by mouth two times a day 3)  Centrum Silver Tabs (Multiple vitamins-minerals) .... One tab by mouth once daily 4)  Metoprolol Tartrate 50 Mg Tabs (Metoprolol tartrate) .... One tab by mouth two times a day 5)  Advair Diskus 100-50 Mcg/dose Misc (Fluticasone-salmeterol) .... One puff bid 6)  Tramadol Hcl 50 Mg Tabs (Tramadol hcl) .... Take 1 tablet by mouth two times a day 7)  Omeprazole 40 Mg Cpdr (Omeprazole) .... One cap by mouth qd 8)  Lovastatin 40 Mg Tabs (Lovastatin) .... One tab by mouth at bedtime  discontinue lipitor  Patient Instructions: 1)  Please schedule a follow-up appointment in 3 months. 2)  Tobacco is very bad for your health and your loved ones! You Should stop smoking!. 3)  Stop Smoking Tips: Choose a Quit date. Cut down before the Quit date. decide what you will do as a substitute when you feel the urge to smoke(gum,toothpick,exercise). 4)  Your oxygen is low, you NEED to uise the oxygen all the time   Orders Added: 1)  Est. Patient Level IV [21308]

## 2010-09-04 NOTE — Progress Notes (Signed)
Summary: medicine  Phone Note Call from Patient   Summary of Call: he needs these filled benzonatate, and penicillin he is coughing like he is getting the pneu. again laying in the bed getting fluid please send to [facility]  Initial call taken by: Lind Guest,  August 28, 2010 3:34 PM  Follow-up for Phone Call        pls refill penicillin and benzoate from feb 22 med list and let him know Follow-up by: [name] MD,  August 28, 2010 4:04 PM  Additional Follow-up for Phone Call Additional follow up Details #1::        meds sent, called patient, no answer Additional Follow-up by: Adella Hare LPN,  August 28, 2010 4:32 PM    Additional Follow-up for Phone Call Additional follow up Details #2::    patient daughter aware Follow-up by: Adella Hare LPN,  August 29, 2010 10:28 AM  New/Updated Medications: PENICILLIN V POTASSIUM 500 MG TABS (PENICILLIN V POTASSIUM) one tab by mouth three times a day TESSALON PERLES 100 MG CAPS (BENZONATATE) one tab by mouth three times a day Prescriptions: TESSALON PERLES 100 MG CAPS (BENZONATATE) one tab by mouth three times a day  #30 x 0   Entered by:   Adella Hare LPN   Authorized by:   [name] MD   Signed by:   Adella Hare LPN on 16/03/9603   Method used:   Electronically to        [facility] (retail)       [address]       [place], [place]  [address]       Ph: [phone]       Fax: [phone]   RxID:   [id number] PENICILLIN V POTASSIUM 500 MG TABS (PENICILLIN V POTASSIUM) one tab by mouth three times a day  #30 x 0   Entered by:   Adella Hare LPN   Authorized by:   [name] MD   Signed by:   Adella Hare LPN on 04/20/2535   Method used:   Electronically to        [facility] (retail)       [address]       [place], [place]  [address]       Ph: [phone]       Fax: [phone]   RxID:   [id number]

## 2010-09-05 LAB — CBC
HCT: 49.3 % (ref 39.0–52.0)
Hemoglobin: 16.2 g/dL (ref 13.0–17.0)
RBC: 5.05 MIL/uL (ref 4.22–5.81)
WBC: 8.2 10*3/uL (ref 4.0–10.5)

## 2010-09-09 LAB — CBC
HCT: 45.6 % (ref 39.0–52.0)
Hemoglobin: 15.1 g/dL (ref 13.0–17.0)
MCV: 95.9 fL (ref 78.0–100.0)
Platelets: 147 10*3/uL — ABNORMAL LOW (ref 150–400)
RBC: 4.75 MIL/uL (ref 4.22–5.81)
WBC: 7.2 10*3/uL (ref 4.0–10.5)

## 2010-09-10 LAB — CBC
HCT: 48.3 % (ref 39.0–52.0)
Hemoglobin: 16.1 g/dL (ref 13.0–17.0)
MCHC: 33.3 g/dL (ref 30.0–36.0)
MCV: 95.8 fL (ref 78.0–100.0)
RBC: 5.05 MIL/uL (ref 4.22–5.81)

## 2010-09-11 LAB — CBC
HCT: 46.3 % (ref 39.0–52.0)
MCV: 97 fL (ref 78.0–100.0)
Platelets: 141 10*3/uL — ABNORMAL LOW (ref 150–400)
WBC: 6.8 10*3/uL (ref 4.0–10.5)

## 2010-09-16 LAB — CBC
Hemoglobin: 15.6 g/dL (ref 13.0–17.0)
MCHC: 34.3 g/dL (ref 30.0–36.0)
RDW: 14.2 % (ref 11.5–15.5)

## 2010-09-24 LAB — BLOOD GAS, ARTERIAL
Bicarbonate: 22.2 mEq/L (ref 20.0–24.0)
FIO2: 0.21 %
O2 Saturation: 91.4 %
Patient temperature: 37
TCO2: 19.1 mmol/L (ref 0–100)

## 2010-09-24 LAB — CBC
HCT: 52.8 % — ABNORMAL HIGH (ref 39.0–52.0)
Hemoglobin: 17.5 g/dL — ABNORMAL HIGH (ref 13.0–17.0)
MCHC: 33.2 g/dL (ref 30.0–36.0)
MCV: 95.9 fL (ref 78.0–100.0)
RDW: 15 % (ref 11.5–15.5)

## 2010-09-24 LAB — FERRITIN: Ferritin: 421 ng/mL — ABNORMAL HIGH (ref 22–322)

## 2010-09-29 LAB — CBC
HCT: 47 % (ref 39.0–52.0)
Platelets: 138 10*3/uL — ABNORMAL LOW (ref 150–400)
RBC: 5.29 MIL/uL (ref 4.22–5.81)
WBC: 6.5 10*3/uL (ref 4.0–10.5)

## 2010-09-29 LAB — FERRITIN: Ferritin: 23 ng/mL (ref 22–322)

## 2010-09-30 ENCOUNTER — Other Ambulatory Visit: Payer: Self-pay | Admitting: Family Medicine

## 2010-09-30 LAB — CBC
HCT: 44.1 % (ref 39.0–52.0)
Hemoglobin: 14.3 g/dL (ref 13.0–17.0)
RBC: 5.17 MIL/uL (ref 4.22–5.81)

## 2010-10-02 LAB — URINALYSIS, ROUTINE W REFLEX MICROSCOPIC
Glucose, UA: NEGATIVE mg/dL
pH: 6.5 (ref 5.0–8.0)

## 2010-10-02 LAB — URINE CULTURE

## 2010-10-02 LAB — CBC
HCT: 40.7 % (ref 39.0–52.0)
Hemoglobin: 14 g/dL (ref 13.0–17.0)
MCHC: 33.2 g/dL (ref 30.0–36.0)
MCHC: 33.3 g/dL (ref 30.0–36.0)
MCV: 84.6 fL (ref 78.0–100.0)
MCV: 85.4 fL (ref 78.0–100.0)
Platelets: 166 10*3/uL (ref 150–400)
RBC: 4.94 MIL/uL (ref 4.22–5.81)
WBC: 8.3 10*3/uL (ref 4.0–10.5)

## 2010-10-02 LAB — DIFFERENTIAL
Basophils Relative: 0 % (ref 0–1)
Eosinophils Absolute: 0.1 10*3/uL (ref 0.0–0.7)
Eosinophils Relative: 1 % (ref 0–5)
Lymphs Abs: 0.6 10*3/uL — ABNORMAL LOW (ref 0.7–4.0)

## 2010-10-02 LAB — URINE MICROSCOPIC-ADD ON

## 2010-10-02 LAB — BASIC METABOLIC PANEL
BUN: 8 mg/dL (ref 6–23)
CO2: 30 mEq/L (ref 19–32)
Chloride: 104 mEq/L (ref 96–112)
Glucose, Bld: 104 mg/dL — ABNORMAL HIGH (ref 70–99)
Potassium: 3.6 mEq/L (ref 3.5–5.1)

## 2010-10-02 LAB — PROTIME-INR: Prothrombin Time: 15.9 seconds — ABNORMAL HIGH (ref 11.6–15.2)

## 2010-10-02 LAB — FERRITIN: Ferritin: 24 ng/mL (ref 22–322)

## 2010-10-03 LAB — CBC
HCT: 40 % (ref 39.0–52.0)
MCV: 80.6 fL (ref 78.0–100.0)
Platelets: 201 10*3/uL (ref 150–400)
RDW: 22.6 % — ABNORMAL HIGH (ref 11.5–15.5)

## 2010-10-09 LAB — BASIC METABOLIC PANEL
BUN: 7 mg/dL (ref 6–23)
BUN: 7 mg/dL (ref 6–23)
BUN: 9 mg/dL (ref 6–23)
CO2: 27 mEq/L (ref 19–32)
Calcium: 8.4 mg/dL (ref 8.4–10.5)
Chloride: 100 mEq/L (ref 96–112)
Chloride: 103 mEq/L (ref 96–112)
Chloride: 105 mEq/L (ref 96–112)
Creatinine, Ser: 0.9 mg/dL (ref 0.4–1.5)
Creatinine, Ser: 0.99 mg/dL (ref 0.4–1.5)
GFR calc Af Amer: >60 mL/min (ref >60)
GFR calc non Af Amer: >60 mL/min (ref >60)
Glucose, Bld: 114 mg/dL — ABNORMAL HIGH (ref 70–99)
Glucose, Bld: 120 mg/dL — ABNORMAL HIGH (ref 70–99)
Potassium: 3.1 mEq/L — ABNORMAL LOW (ref 3.5–5.1)
Potassium: 3.2 mEq/L — ABNORMAL LOW (ref 3.5–5.1)
Sodium: 138 mEq/L (ref 135–145)

## 2010-10-09 LAB — CBC
HCT: 28.5 % — ABNORMAL LOW (ref 39.0–52.0)
HCT: 33.3 % — ABNORMAL LOW (ref 39.0–52.0)
Hemoglobin: 8.8 g/dL — ABNORMAL LOW (ref 13.0–17.0)
MCHC: 31 g/dL (ref 30.0–36.0)
MCHC: 31 g/dL (ref 30.0–36.0)
MCHC: 31.8 g/dL (ref 30.0–36.0)
MCV: 71.4 fL — ABNORMAL LOW (ref 78.0–100.0)
MCV: 73.2 fL — ABNORMAL LOW (ref 78.0–100.0)
MCV: 73.7 fL — ABNORMAL LOW (ref 78.0–100.0)
Platelets: 216 10*3/uL (ref 150–400)
Platelets: 232 10*3/uL (ref 150–400)
Platelets: 255 10*3/uL (ref 150–400)
RBC: 4.09 MIL/uL — ABNORMAL LOW (ref 4.22–5.81)
RDW: 21.5 % — ABNORMAL HIGH (ref 11.5–15.5)
RDW: 21.6 % — ABNORMAL HIGH (ref 11.5–15.5)
RDW: 21.8 % — ABNORMAL HIGH (ref 11.5–15.5)
WBC: 6.9 10*3/uL (ref 4.0–10.5)
WBC: 7.9 10*3/uL (ref 4.0–10.5)

## 2010-10-09 LAB — URINALYSIS, ROUTINE W REFLEX MICROSCOPIC
Glucose, UA: 100 mg/dL — AB
Glucose, UA: >1000 mg/dL — AB
Ketones, ur: 15 mg/dL — AB
Nitrite: POSITIVE — AB
Protein, ur: >300 mg/dL — AB
Protein, ur: >300 mg/dL — AB
pH: 7 (ref 5.0–8.0)

## 2010-10-09 LAB — DIFFERENTIAL
Eosinophils Absolute: 0.1 10*3/uL (ref 0.0–0.7)
Eosinophils Absolute: 0.3 10*3/uL (ref 0.0–0.7)
Eosinophils Relative: 2 % (ref 0–5)
Lymphocytes Relative: 11 % — ABNORMAL LOW (ref 12–46)
Lymphs Abs: 0.7 10*3/uL (ref 0.7–4.0)
Lymphs Abs: 1 10*3/uL (ref 0.7–4.0)
Monocytes Relative: 11 % (ref 3–12)
Monocytes Relative: 12 % (ref 3–12)
Neutro Abs: 6 10*3/uL (ref 1.7–7.7)
Neutrophils Relative %: 74 % (ref 43–77)
Neutrophils Relative %: 75 % (ref 43–77)

## 2010-10-09 LAB — URINE MICROSCOPIC-ADD ON

## 2010-10-09 LAB — TYPE AND SCREEN: Antibody Screen: POSITIVE

## 2010-10-09 LAB — URINE CULTURE: Colony Count: >=100000

## 2010-10-09 LAB — COMPREHENSIVE METABOLIC PANEL
ALT: 13 U/L (ref 0–53)
AST: 18 U/L (ref 0–37)
Calcium: 8.8 mg/dL (ref 8.4–10.5)
GFR calc Af Amer: >60 mL/min (ref >60)
Sodium: 141 mEq/L (ref 135–145)
Total Protein: 7.3 g/dL (ref 6.0–8.3)

## 2010-10-09 LAB — PROTIME-INR
INR: 1.2 (ref 0.00–1.49)
Prothrombin Time: 15.4 seconds — ABNORMAL HIGH (ref 11.6–15.2)
Prothrombin Time: 15.8 seconds — ABNORMAL HIGH (ref 11.6–15.2)

## 2010-10-09 LAB — PREPARE RBC (CROSSMATCH)

## 2010-10-09 LAB — MAGNESIUM: Magnesium: 1.9 mg/dL (ref 1.5–2.5)

## 2010-10-09 LAB — APTT: aPTT: 33 seconds (ref 24–37)

## 2010-10-23 ENCOUNTER — Emergency Department (HOSPITAL_COMMUNITY): Payer: Medicare Other

## 2010-10-23 ENCOUNTER — Inpatient Hospital Stay (HOSPITAL_COMMUNITY)
Admission: EM | Admit: 2010-10-23 | Discharge: 2010-11-05 | DRG: 870 | Disposition: A | Discharge status: Skilled Nursing Facility | Payer: Medicare Other | Attending: Internal Medicine | Admitting: Internal Medicine

## 2010-10-23 DIAGNOSIS — D696 Thrombocytopenia, unspecified: Secondary | ICD-10-CM | POA: Diagnosis not present

## 2010-10-23 DIAGNOSIS — K219 Gastro-esophageal reflux disease without esophagitis: Secondary | ICD-10-CM | POA: Diagnosis present

## 2010-10-23 DIAGNOSIS — A411 Sepsis due to other specified staphylococcus: Principal | ICD-10-CM | POA: Diagnosis present

## 2010-10-23 DIAGNOSIS — E876 Hypokalemia: Secondary | ICD-10-CM | POA: Diagnosis not present

## 2010-10-23 DIAGNOSIS — I2510 Atherosclerotic heart disease of native coronary artery without angina pectoris: Secondary | ICD-10-CM | POA: Diagnosis present

## 2010-10-23 DIAGNOSIS — I5031 Acute diastolic (congestive) heart failure: Secondary | ICD-10-CM | POA: Diagnosis not present

## 2010-10-23 DIAGNOSIS — C61 Malignant neoplasm of prostate: Secondary | ICD-10-CM | POA: Diagnosis present

## 2010-10-23 DIAGNOSIS — E785 Hyperlipidemia, unspecified: Secondary | ICD-10-CM | POA: Diagnosis present

## 2010-10-23 DIAGNOSIS — I214 Non-ST elevation (NSTEMI) myocardial infarction: Secondary | ICD-10-CM | POA: Diagnosis not present

## 2010-10-23 DIAGNOSIS — N179 Acute kidney failure, unspecified: Secondary | ICD-10-CM | POA: Diagnosis present

## 2010-10-23 DIAGNOSIS — I509 Heart failure, unspecified: Secondary | ICD-10-CM | POA: Diagnosis not present

## 2010-10-23 DIAGNOSIS — J441 Chronic obstructive pulmonary disease with (acute) exacerbation: Secondary | ICD-10-CM | POA: Diagnosis present

## 2010-10-23 DIAGNOSIS — Z954 Presence of other heart-valve replacement: Secondary | ICD-10-CM

## 2010-10-23 DIAGNOSIS — I4891 Unspecified atrial fibrillation: Secondary | ICD-10-CM | POA: Diagnosis present

## 2010-10-23 DIAGNOSIS — M109 Gout, unspecified: Secondary | ICD-10-CM | POA: Diagnosis present

## 2010-10-23 DIAGNOSIS — A419 Sepsis, unspecified organism: Secondary | ICD-10-CM | POA: Diagnosis present

## 2010-10-23 DIAGNOSIS — E8809 Other disorders of plasma-protein metabolism, not elsewhere classified: Secondary | ICD-10-CM | POA: Diagnosis present

## 2010-10-23 DIAGNOSIS — J189 Pneumonia, unspecified organism: Secondary | ICD-10-CM | POA: Diagnosis not present

## 2010-10-23 DIAGNOSIS — J9620 Acute and chronic respiratory failure, unspecified whether with hypoxia or hypercapnia: Secondary | ICD-10-CM | POA: Diagnosis present

## 2010-10-23 DIAGNOSIS — Z9981 Dependence on supplemental oxygen: Secondary | ICD-10-CM

## 2010-10-23 LAB — DIFFERENTIAL
Basophils Relative: 0 % (ref 0–1)
Eosinophils Relative: 0 % (ref 0–5)
Monocytes Relative: 10 % (ref 3–12)
Neutrophils Relative %: 81 % — ABNORMAL HIGH (ref 43–77)

## 2010-10-23 LAB — BASIC METABOLIC PANEL
BUN: 82 mg/dL — ABNORMAL HIGH (ref 6–23)
CO2: 26 mEq/L (ref 19–32)
Chloride: 98 mEq/L (ref 96–112)
Creatinine, Ser: 2.5 mg/dL — ABNORMAL HIGH (ref 0.4–1.5)
Glucose, Bld: 139 mg/dL — ABNORMAL HIGH (ref 70–99)
Potassium: 3.9 mEq/L (ref 3.5–5.1)

## 2010-10-23 LAB — BLOOD GAS, ARTERIAL
Acid-base deficit: 0.1 mmol/L (ref 0.0–2.0)
Acid-base deficit: 0.7 mmol/L (ref 0.0–2.0)
Bicarbonate: 26.1 mEq/L — ABNORMAL HIGH (ref 20.0–24.0)
Drawn by: 23534
Drawn by: 22223
O2 Content: 65 L/min
O2 Saturation: 98.1 %
TCO2: 23.7 mmol/L (ref 0–100)
TCO2: 23.4 mmol/L (ref 0–100)
pCO2 arterial: 65.8 mmHg (ref 35.0–45.0)
pCO2 arterial: 66 mmHg (ref 35.0–45.0)
pO2, Arterial: 145 mmHg — ABNORMAL HIGH (ref 80.0–100.0)
pO2, Arterial: 130 mmHg — ABNORMAL HIGH (ref 80.0–100.0)

## 2010-10-23 LAB — CBC
HCT: 52.2 % — ABNORMAL HIGH (ref 39.0–52.0)
MCH: 32 pg (ref 26.0–34.0)
MCV: 98.9 fL (ref 78.0–100.0)
RBC: 5.28 MIL/uL (ref 4.22–5.81)
WBC: 15.6 10*3/uL — ABNORMAL HIGH (ref 4.0–10.5)

## 2010-10-23 LAB — POCT CARDIAC MARKERS
Myoglobin, poc: 352 ng/mL (ref 12–200)
Troponin i, poc: <0.05 ng/mL (ref 0.00–0.09)

## 2010-10-23 LAB — MRSA PCR SCREENING: MRSA by PCR: NEGATIVE

## 2010-10-24 ENCOUNTER — Telehealth: Payer: Self-pay | Admitting: Family Medicine

## 2010-10-24 ENCOUNTER — Inpatient Hospital Stay (HOSPITAL_COMMUNITY): Payer: Medicare Other

## 2010-10-24 DIAGNOSIS — R0609 Other forms of dyspnea: Secondary | ICD-10-CM

## 2010-10-24 DIAGNOSIS — I359 Nonrheumatic aortic valve disorder, unspecified: Secondary | ICD-10-CM

## 2010-10-24 DIAGNOSIS — R0989 Other specified symptoms and signs involving the circulatory and respiratory systems: Secondary | ICD-10-CM

## 2010-10-24 LAB — MAGNESIUM: Magnesium: 2.8 mg/dL — ABNORMAL HIGH (ref 1.5–2.5)

## 2010-10-24 LAB — CARDIAC PANEL(CRET KIN+CKTOT+MB+TROPI)
CK, MB: 3.6 ng/mL (ref 0.3–4.0)
Relative Index: INVALID (ref 0.0–2.5)
Total CK: 53 U/L (ref 7–232)
Total CK: 46 U/L (ref 7–232)
Total CK: 50 U/L (ref 7–232)
Troponin I: <0.3 ng/mL (ref <0.30)
Troponin I: 0.32 ng/mL (ref <0.30)

## 2010-10-24 LAB — CBC
Hemoglobin: 15.7 g/dL (ref 13.0–17.0)
Platelets: 208 10*3/uL (ref 150–400)
RBC: 4.9 MIL/uL (ref 4.22–5.81)
WBC: 13.1 10*3/uL — ABNORMAL HIGH (ref 4.0–10.5)

## 2010-10-24 LAB — BLOOD GAS, ARTERIAL
Acid-base deficit: 2.6 mmol/L — ABNORMAL HIGH (ref 0.0–2.0)
Bicarbonate: 24.2 mEq/L — ABNORMAL HIGH (ref 20.0–24.0)
FIO2: 40 %
Patient temperature: 37
TCO2: 21.9 mmol/L (ref 0–100)
pH, Arterial: 7.219 — ABNORMAL LOW (ref 7.350–7.450)

## 2010-10-24 LAB — DIFFERENTIAL
Basophils Absolute: 0 10*3/uL (ref 0.0–0.1)
Eosinophils Absolute: 0.1 10*3/uL (ref 0.0–0.7)
Lymphocytes Relative: 3 % — ABNORMAL LOW (ref 12–46)
Monocytes Absolute: 0.5 10*3/uL (ref 0.1–1.0)
Neutrophils Relative %: 92 % — ABNORMAL HIGH (ref 43–77)

## 2010-10-24 LAB — BASIC METABOLIC PANEL
BUN: 87 mg/dL — ABNORMAL HIGH (ref 6–23)
Chloride: 96 mEq/L (ref 96–112)
GFR calc Af Amer: 31 mL/min — ABNORMAL LOW (ref >60)
GFR calc non Af Amer: 26 mL/min — ABNORMAL LOW (ref >60)
Potassium: 3.9 mEq/L (ref 3.5–5.1)
Sodium: 136 mEq/L (ref 135–145)

## 2010-10-24 LAB — PRO B NATRIURETIC PEPTIDE: Pro B Natriuretic peptide (BNP): 21327 pg/mL — ABNORMAL HIGH (ref 0–450)

## 2010-10-24 LAB — LACTIC ACID, PLASMA: Lactic Acid, Venous: 1.2 mmol/L (ref 0.5–2.2)

## 2010-10-24 LAB — PROCALCITONIN: Procalcitonin: 0.26 ng/mL

## 2010-10-24 LAB — PHOSPHORUS: Phosphorus: 5.9 mg/dL — ABNORMAL HIGH (ref 2.3–4.6)

## 2010-10-24 NOTE — Telephone Encounter (Signed)
Noted . Attempted to contact daughter with message of concern, unable to leave msg, let her know of my concern if she calls back pls

## 2010-10-24 NOTE — Group Therapy Note (Signed)
NAMEJAYD, CADIEUX             ACCOUNT NO.:  [id number]  MEDICAL RECORD NO.:  [id number]           PATIENT TYPE:  I  LOCATION:  IC03                          FACILITY:  [facility]  PHYSICIAN:  Wilson Singer, M.D.DATE OF BIRTH:  [birth year]  DATE OF PROCEDURE:  10/24/2010 DATE OF DISCHARGE:                                PROGRESS NOTE   This man was admitted yesterday with respiratory failure in a shock state.  The daughter who lives with him describes a 3-4 day history of cough productive of purulent sputum, increasing shortness of breath and last night he became rather confused.  However, he was able to tell my colleague Dr. Nedra Hai that he wanted to be a full code and that he wanted to be intubated and ventilated if necessary.  The patient has oxygen- dependent COPD, but seems to function quite well according to the daughter.  He walks with a walker and does not seem to get dyspneic on exertion or putting his clothes on.  According to her, he would stop when he is walking because of painful knees rather than his dyspnea. The patient does have a history of nonischemic cardiomyopathy with ejection fraction documented at 40% in 2008, history of mitral valve repair with a PEG valve and previously on Coumadin.  The Coumadin was discontinued because of significant hematuria from what was eventually discovered to be prostate cancer.  He apparently gets quarterly Lupron injections per daughter's description.  He has a history of chronic atrial fibrillation.  He has never had a stroke or myocardial infarction.  PHYSICAL EXAMINATION:  GENERAL:  Afebrile. VITAL SIGNS:  Blood pressure 114/71, although according to the nursing staff this is not very reliable, pulse 99 and atrial fibrillation, saturation 97% on FIO2 of 45%.  He does appear to be shocked and his peripheries are clammy. CARDIOVASCULAR:  Heart sounds are present and irregular.  Lung fields are clear without any  wheezing. ABDOMEN:  Soft and nontender.  Jugular venous pressure does not appear to be raised. EXTREMITIES:  He does not have peripheral pitting edema. NEUROLOGICALLY:  He is rather confused and not particularly arousable. He continues on BiPAP.  INVESTIGATIONS:  Hemoglobin 15.7, white blood cell count 13.1 and platelets 208.  Sodium 136, potassium 3.9, bicarbonate 25, glucose 222, BUN 87, creatinine 2.4 which is slightly decreased from creatinine yesterday of 2.5.  His input/output shows him to be in positive balance of 1275.  Chest x-ray does not show any clear pneumonia.  I wonder if there is any problem of vascular congestion.  IMPRESSION: 1. Septic shock possibly due to lung infection. 2. Chronic obstructive pulmonary disease with an exacerbation. 3. Nonischemic cardiomyopathy, ejection fraction 40% documented in     2008. 4. Atrial fibrillation not on Coumadin. 5. History of prostate cancer. 6. Acute renal failure now.  PLAN: 1. Continue IV fluids. 2. Intravenous steroids. 3. Pulmonary consultation and I appreciate Dr. Juanetta Gosling input. 4. Cardiology consultation and 2-D echocardiogram.  I have discussed again code status with the daughter and she maintains that the patient himself yesterday clearly stated that he wanted to be a full code and  to be intubated on the ventilator if necessary.  We will honor this for the time being.     Wilson Singer, M.D.     NCG/MEDQ  D:  10/24/2010  T:  10/24/2010  Job:  [id number]  Electronically Signed by Lilly Cove M.D. on 10/24/2010 12:24:55 PM

## 2010-10-24 NOTE — Telephone Encounter (Signed)
noted 

## 2010-10-25 ENCOUNTER — Inpatient Hospital Stay (HOSPITAL_COMMUNITY): Payer: Medicare Other

## 2010-10-25 LAB — COMPREHENSIVE METABOLIC PANEL
ALT: 18 U/L (ref 0–53)
AST: 22 U/L (ref 0–37)
Albumin: 3 g/dL — ABNORMAL LOW (ref 3.5–5.2)
CO2: 26 mEq/L (ref 19–32)
Calcium: 8.7 mg/dL (ref 8.4–10.5)
Creatinine, Ser: 1.77 mg/dL — ABNORMAL HIGH (ref 0.4–1.5)
GFR calc Af Amer: 44 mL/min — ABNORMAL LOW (ref >60)
Sodium: 139 mEq/L (ref 135–145)
Total Protein: 7.1 g/dL (ref 6.0–8.3)

## 2010-10-25 LAB — CBC
HCT: 45.1 % (ref 39.0–52.0)
MCH: 31.4 pg (ref 26.0–34.0)
MCHC: 31.5 g/dL (ref 30.0–36.0)
MCV: 99.8 fL (ref 78.0–100.0)
Platelets: 197 10*3/uL (ref 150–400)
RBC: 4.52 MIL/uL (ref 4.22–5.81)
RDW: 14.8 % (ref 11.5–15.5)
WBC: 10.9 10*3/uL — ABNORMAL HIGH (ref 4.0–10.5)

## 2010-10-25 NOTE — Group Therapy Note (Signed)
  NAMEANTOINNE, SPADACCINI             ACCOUNT NO.:  [id number]  MEDICAL RECORD NO.:  [id number]           PATIENT TYPE:  LOCATION:                                 FACILITY:  PHYSICIAN:  Wilson Singer, M.D.DATE OF BIRTH:  1921/08/14  DATE OF PROCEDURE:  10/25/2010 DATE OF DISCHARGE:                                PROGRESS NOTE   HISTORY OF PRESENT ILLNESS:  This man looks like he has improved.  He has been requiring less inspired oxygen.  Cardiology saw the patient, and it is not clear to me whether they feel there is an element of heart failure or not.  We will await the echocardiogram report that was done yesterday.  He has been more alert according to the daughter who is at the bedside also.  Blood cultures from only one bottle are showing gram- negative rods and also gram-positive cocci in clusters.  PHYSICAL EXAMINATION:  VITAL SIGNS:  Temperature 97.5, blood pressure 97/60, pulse 87 appears to be in sinus rhythm, saturation 97% on 2 liters of oxygen.  His input an output is positive by only 225 mL. HEART:  Heart sounds are present and normal. CHEST:  Lung fields anteriorly are clear.  He has a BiPAP machine at the present time.  LABORATORY DATA:  Investigations show hemoglobin 14.2, white blood cell count 10.9, and platelets 197.  Sodium 139, potassium 3.9, bicarbonate 26, BUN 88, creatinine 1.7 which is improving, albumin 3.0.  IMPRESSION: 1. Septic shock likely from lung infection. 2. Chronic obstructive pulmonary disease exacerbation. 3. Acute renal failure from shock, improving. 4. Atrial fibrillation. 5. History of prostate cancer. 6. Nonischemic cardiomyopathy.  PLAN: 1. Continue current regimen and I see the antibiotics were switched     somewhat and Maxipime and vancomycin has been added and Rocephin     has been discontinued. 2. Await blood cultures and identification of any organisms. 3. I wonder if this man needs CPAP machine at home since he seems to    desaturate sometimes and may require this at night.  I will leave     this to the discretion of Dr. Abbe Amsterdam, Pulmonology.     Wilson Singer, M.D.     NCG/MEDQ  D:  10/25/2010  T:  10/25/2010  Job:  [id number]  Electronically Signed by Lilly Cove M.D. on 10/25/2010 12:36:57 PM

## 2010-10-26 ENCOUNTER — Inpatient Hospital Stay (HOSPITAL_COMMUNITY): Payer: Medicare Other

## 2010-10-26 LAB — CBC
HCT: 46.6 % (ref 39.0–52.0)
Hemoglobin: 14.5 g/dL (ref 13.0–17.0)
MCHC: 31.1 g/dL (ref 30.0–36.0)
MCV: 99.4 fL (ref 78.0–100.0)
RDW: 14.8 % (ref 11.5–15.5)
WBC: 9.7 10*3/uL (ref 4.0–10.5)

## 2010-10-26 LAB — BASIC METABOLIC PANEL
BUN: 76 mg/dL — ABNORMAL HIGH (ref 6–23)
CO2: 26 mEq/L (ref 19–32)
Glucose, Bld: 148 mg/dL — ABNORMAL HIGH (ref 70–99)
Potassium: 4 mEq/L (ref 3.5–5.1)
Sodium: 140 mEq/L (ref 135–145)

## 2010-10-26 LAB — BLOOD GAS, ARTERIAL
Delivery systems: POSITIVE
Drawn by: 22223
FIO2: 40 %
Inspiratory PAP: 18
Patient temperature: 37
TCO2: 21.5 mmol/L (ref 0–100)
pCO2 arterial: 52.5 mmHg — ABNORMAL HIGH (ref 35.0–45.0)
pH, Arterial: 7.286 — ABNORMAL LOW (ref 7.350–7.450)

## 2010-10-26 LAB — CARDIAC PANEL(CRET KIN+CKTOT+MB+TROPI)
CK, MB: 10.1 ng/mL (ref 0.3–4.0)
CK, MB: 8.4 ng/mL (ref 0.3–4.0)
Relative Index: 2 (ref 0.0–2.5)
Relative Index: 2.6 — ABNORMAL HIGH (ref 0.0–2.5)
Total CK: 322 U/L — ABNORMAL HIGH (ref 7–232)

## 2010-10-26 LAB — CULTURE, BLOOD (ROUTINE X 2): Culture  Setup Time: 201205032336

## 2010-10-26 LAB — DIFFERENTIAL
Eosinophils Absolute: 0 10*3/uL (ref 0.0–0.7)
Eosinophils Relative: 0 % (ref 0–5)
Lymphocytes Relative: 5 % — ABNORMAL LOW (ref 12–46)
Lymphs Abs: 0.4 10*3/uL — ABNORMAL LOW (ref 0.7–4.0)
Monocytes Relative: 7 % (ref 3–12)

## 2010-10-26 NOTE — Group Therapy Note (Signed)
  NAME:  [name], [name]             ACCOUNT NO.:  [id number]  MEDICAL RECORD NO.:  [id number]           PATIENT TYPE:  LOCATION:                                 FACILITY:  PHYSICIAN:  Wilson Singer, M.D.DATE OF BIRTH:  1922/04/02  DATE OF PROCEDURE:  10/26/2010 DATE OF DISCHARGE:                                PROGRESS NOTE   This man overnight seems to require BiPAP to keep saturations above 92% or so.  I had a conversation with the daughter who tells me that when his saturation is below 80% he becomes rather confused and when he is on 2 liters of oxygen at home and maintaining saturations in the high 80s, he is perfectly fine.  She has also noticed that when his saturations are above 90% and high 90s, he also seems to become confused and I wonder whether he is retaining CO2 and losing his oxygen drive here. Otherwise, his renal function yesterday was improving.  We do not have lab work available from this morning.  PHYSICAL EXAMINATION:  GENERAL:  He is on the BiPAP machine behaving agitated and confused. VITAL SIGNS:  Temperature 97.6, blood pressure 117/88, pulse 89 in atrial fibrillation, saturation 97% on 40% FiO2 on BiPAP. CARDIAC:  Heart sounds present and irregular. CHEST:  Lung fields appeared to be clear although he is agitated significantly at the present time.  INVESTIGATIONS:  Arterial blood gas shows an acidosis with pCO2 of 52 and I suspect part of this is his renal failure.  His PO2 is 106.  IMPRESSION: 1. Septic shock, likely from lung infection, clinically improving. 2. Chronic obstructive pulmonary disease with exacerbation. 3. Acute renal failure from shock and improving. 4. Atrial fibrillation. 5. History of prostate cancer. 6. Nonischemic cardiomyopathy.  The echocardiogram result is still     pending. 7. Blood cultures are showing that there are really no Gram-negative     rods and this is predominantly Gram-positive cocci in  clusters.  PLAN: 1. I think we can safely move him to the floor and try and maintain     his saturation in the high 80s rather than     aiming for 90% and above, and I think he probably would be served     better by this. 2. Await echocardiogram for further recommendations. 3. Physical therapy.     Wilson Singer, M.D.     NCG/MEDQ  D:  10/26/2010  T:  10/26/2010  Job:  [id number]  Electronically Signed by Lilly Cove M.D. on 10/26/2010 06:47:12 PM

## 2010-10-27 ENCOUNTER — Inpatient Hospital Stay (HOSPITAL_COMMUNITY): Payer: Medicare Other

## 2010-10-27 LAB — CBC
HCT: 45.5 % (ref 39.0–52.0)
Hemoglobin: 14.1 g/dL (ref 13.0–17.0)
MCH: 31.1 pg (ref 26.0–34.0)
MCHC: 31 g/dL (ref 30.0–36.0)
MCV: 100.4 fL — ABNORMAL HIGH (ref 78.0–100.0)
RBC: 4.53 MIL/uL (ref 4.22–5.81)

## 2010-10-27 LAB — BASIC METABOLIC PANEL
BUN: 57 mg/dL — ABNORMAL HIGH (ref 6–23)
CO2: 25 mEq/L (ref 19–32)
Chloride: 107 mEq/L (ref 96–112)
Chloride: 105 mEq/L (ref 96–112)
Creatinine, Ser: 0.95 mg/dL (ref 0.4–1.5)
GFR calc Af Amer: >60 mL/min (ref >60)
GFR calc Af Amer: >60 mL/min (ref >60)
Glucose, Bld: 131 mg/dL — ABNORMAL HIGH (ref 70–99)
Potassium: 3.2 mEq/L — ABNORMAL LOW (ref 3.5–5.1)

## 2010-10-27 LAB — BLOOD GAS, ARTERIAL
Bicarbonate: 26.2 mEq/L — ABNORMAL HIGH (ref 20.0–24.0)
Bicarbonate: 25.1 mEq/L — ABNORMAL HIGH (ref 20.0–24.0)
Delivery systems: POSITIVE
Expiratory PAP: 8
FIO2: 100 %
Patient temperature: 37
TCO2: 21.9 mmol/L (ref 0–100)
pCO2 arterial: 56.4 mmHg — ABNORMAL HIGH (ref 35.0–45.0)
pCO2 arterial: 40.1 mmHg (ref 35.0–45.0)
pH, Arterial: 7.289 — ABNORMAL LOW (ref 7.350–7.450)
pH, Arterial: 7.413 (ref 7.350–7.450)

## 2010-10-27 LAB — DIFFERENTIAL
Lymphocytes Relative: 4 % — ABNORMAL LOW (ref 12–46)
Lymphs Abs: 0.3 10*3/uL — ABNORMAL LOW (ref 0.7–4.0)
Monocytes Absolute: 0.5 10*3/uL (ref 0.1–1.0)
Monocytes Relative: 6 % (ref 3–12)
Neutro Abs: 7.5 10*3/uL (ref 1.7–7.7)
Neutrophils Relative %: 91 % — ABNORMAL HIGH (ref 43–77)

## 2010-10-27 LAB — MAGNESIUM: Magnesium: 2.2 mg/dL (ref 1.5–2.5)

## 2010-10-27 NOTE — Group Therapy Note (Signed)
  NAME:  [name], [name]             ACCOUNT NO.:  [id number]  MEDICAL RECORD NO.:  [id number]           PATIENT TYPE:  I  LOCATION:  IC03                          FACILITY:  [facility]  PHYSICIAN:  Wilson Singer, M.D.DATE OF BIRTH:  08-09-1921  DATE OF PROCEDURE:  10/27/2010 DATE OF DISCHARGE:                                PROGRESS NOTE   Yesterday, we tried to free this man from his BiPAP, but he desaturated to unsafe level of oxygenation and he had to go back on BiPAP.  He therefore remains in the intensive care unit.  He has done reasonably well overnight on the BiPAP.  PHYSICAL EXAMINATION:  VITAL SIGNS:  Temperature 98.3, blood pressure 103/66, pulse 93 and appears to be in atrial fibrillation, saturation 97% with an FIO2 of 40% on the BiPAP. HEART:  Heart sounds are present and irregular. LUNGS:  Lung fields are essentially clear with a few coarse rhonchi scattered.  INVESTIGATIONS:  Sodium 143, potassium 4.0, bicarbonate 25, BUN 57, creatinine 0.95.  Hemoglobin 14.1, white blood cell count 8.3, platelets 179.  IMPRESSION: 1. Septic shock, improving. 2. Chronic obstructive pulmonary disease with exacerbation. 3. Acute renal failure, improving. 4. Atrial fibrillation. 5. Nonischemic cardiomyopathy.  The echocardiogram result, we still do     not have. 6. Blood culture showing gram-positive cocci in clusters.  We will     await identification  PLAN: 1. Stat ABG 2. Decrease his IV fluids slightly. 3. Continue BiPAP for now.  I had a discussion with the patient's daughter who is one of the healthcare power of attornies and she expressed that she would like him to be in intubated and ventilated if necessary and we will honor this for the time being.  I think today we will probably keep him on BiPAP and tomorrow, we will try and free him from the BiPAP.  Hopefully, he will do well.  If he does not and decompensates, I think we probably have no option, but to  intubate and mechanically ventilate him.     Wilson Singer, M.D.     NCG/MEDQ  D:  10/27/2010  T:  10/27/2010  Job:  [id number]  Electronically Signed by Lilly Cove M.D. on 10/27/2010 12:04:58 PM

## 2010-10-27 NOTE — Group Therapy Note (Signed)
  NAME:  TRAYVON, TRUMBULL             ACCOUNT NO.:  [id number]  MEDICAL RECORD NO.:  [id number]           PATIENT TYPE:  LOCATION:                                 FACILITY:  PHYSICIAN:  Wilson Singer, M.D.DATE OF BIRTH:  April 21, 1922  DATE OF PROCEDURE:  10/27/2010 DATE OF DISCHARGE:                                PROGRESS NOTE   ADDENDUM  During the course of this morning, the patient has become more combative, agitated, and his arterial blood gas this morning showed a pH of 7.3, pCO2 of 56, pO2 of 82.  pCO2 is increasing and he is becoming more hypoxic compared to yesterday morning.  I believe he is really not going to do well with BiPAP alone now and we will proceed to intubate and mechanically ventilate this man.  I have discussed this with the patient's daughter who is the healthcare power of attorney earlier and these were her wishes as was the patient's wishes.  The daughter understands that if he does not do well on mechanical ventilation and does not seem to improve, then there may be a time where we will have to discontinue this and let nature take its course.     Wilson Singer, M.D.     NCG/MEDQ  D:  10/27/2010  T:  10/27/2010  Job:  [id number]  Electronically Signed by Lilly Cove M.D. on 10/27/2010 12:05:45 PM

## 2010-10-28 ENCOUNTER — Inpatient Hospital Stay (HOSPITAL_COMMUNITY): Payer: Medicare Other

## 2010-10-28 LAB — BLOOD GAS, ARTERIAL
Acid-Base Excess: 4.7 mmol/L — ABNORMAL HIGH (ref 0.0–2.0)
FIO2: 0.6 %
MECHVT: 600 mL
Patient temperature: 37
TCO2: 23.5 mmol/L (ref 0–100)

## 2010-10-28 LAB — COMPREHENSIVE METABOLIC PANEL
Albumin: 3 g/dL — ABNORMAL LOW (ref 3.5–5.2)
BUN: 42 mg/dL — ABNORMAL HIGH (ref 6–23)
Chloride: 105 mEq/L (ref 96–112)
Creatinine, Ser: 0.96 mg/dL (ref 0.4–1.5)
GFR calc non Af Amer: >60 mL/min (ref >60)
Glucose, Bld: 146 mg/dL — ABNORMAL HIGH (ref 70–99)
Total Bilirubin: 0.7 mg/dL (ref 0.3–1.2)

## 2010-10-28 LAB — CBC
MCH: 30.6 pg (ref 26.0–34.0)
MCV: 96.9 fL (ref 78.0–100.0)
Platelets: 174 10*3/uL (ref 150–400)
RBC: 4.77 MIL/uL (ref 4.22–5.81)

## 2010-10-28 LAB — DIFFERENTIAL
Eosinophils Absolute: 0 10*3/uL (ref 0.0–0.7)
Eosinophils Relative: 0 % (ref 0–5)
Lymphs Abs: 0.4 10*3/uL — ABNORMAL LOW (ref 0.7–4.0)
Monocytes Relative: 8 % (ref 3–12)
Neutrophils Relative %: 88 % — ABNORMAL HIGH (ref 43–77)

## 2010-10-28 NOTE — Group Therapy Note (Signed)
  [name]             ACCOUNT NO.:  [id number]  MEDICAL RECORD NO.:  [id number]           PATIENT TYPE:  I  LOCATION:  IC03                          FACILITY:  [facility]  PHYSICIAN:  Wilson Singer, M.D.DATE OF BIRTH:  [birth year]  DATE OF PROCEDURE:  10/28/2010 DATE OF DISCHARGE:                                PROGRESS NOTE   This man overnight has had frequent episodes of PVCs, bigeminy, and short episodes of ventricular tachycardia.  Nurse that was on last night tells me that she was able to suction thick white sputum.  Otherwise, he has remained stable apart from the need to have more oxygen to keep his saturations up.  PHYSICAL EXAMINATION:  VITAL SIGNS:  Temperature 97.3, pulse 103, blood pressure 135/73, saturation 90% on FIO2 of 60%. HEART:  Heart sounds are present and irregular with frequent PVCs. LUNGS:  Lung fields are essentially clear anteriorly with a few rhonchi scattered.  He is ventilated and sedated.  Investigations show sodium 144, potassium 3.6, bicarbonate 30, BUN 42, creatinine 0.96.  Hemoglobin 14.6, white blood cell count 9.8, platelets 174.  His input and output is more or less even.  IMPRESSION: 1. Ventilator-dependent respiratory failure secondary to likely a     septic picture with possible developing adult respiratory distress     syndrome. 2. Element of congestive heart failure. 3. Chronic obstructive pulmonary disease. 4. Acute renal failure, improved and stable. 5. Multiple premature ventricular contractions. 6. Nonischemic cardiomyopathy with echocardiogram results still     pending.  PLAN:  We will repeat a chest x-ray today.  We will adjust his ventilator settings as his ABGs this morning show pH of 7.5 with a pCO2 of 34.8, pO2 96.  My overall impression is that this man is probably likely not to do well and if his oxygen requirements continue to increase, we may have to discuss end-of-life care with the patient's  daughter.    Wilson Singer, M.D.    NCG/MEDQ  D:  10/28/2010  T:  10/28/2010  Job:  [id number]  Electronically Signed by Lilly Cove M.D. on 10/28/2010 11:16:15 AM

## 2010-10-29 ENCOUNTER — Inpatient Hospital Stay (HOSPITAL_COMMUNITY): Payer: Medicare Other

## 2010-10-29 ENCOUNTER — Telehealth: Payer: Self-pay | Admitting: Family Medicine

## 2010-10-29 LAB — BLOOD GAS, ARTERIAL
PEEP: 5 cmH2O
RATE: 12 resp/min
pCO2 arterial: 39.3 mmHg (ref 35.0–45.0)
pH, Arterial: 7.47 — ABNORMAL HIGH (ref 7.350–7.450)
pO2, Arterial: 83.4 mmHg (ref 80.0–100.0)

## 2010-10-29 LAB — BASIC METABOLIC PANEL
BUN: 35 mg/dL — ABNORMAL HIGH (ref 6–23)
Chloride: 103 mEq/L (ref 96–112)
GFR calc Af Amer: >60 mL/min (ref >60)
GFR calc non Af Amer: >60 mL/min (ref >60)
Potassium: 3 mEq/L — ABNORMAL LOW (ref 3.5–5.1)

## 2010-10-29 LAB — CBC
MCV: 97.1 fL (ref 78.0–100.0)
Platelets: 157 10*3/uL (ref 150–400)
RBC: 4.44 MIL/uL (ref 4.22–5.81)
RDW: 15 % (ref 11.5–15.5)
WBC: 9.4 10*3/uL (ref 4.0–10.5)

## 2010-10-29 LAB — CULTURE, BLOOD (ROUTINE X 2)

## 2010-10-29 LAB — DIFFERENTIAL
Basophils Absolute: 0 10*3/uL (ref 0.0–0.1)
Basophils Relative: 0 % (ref 0–1)
Eosinophils Absolute: 0 10*3/uL (ref 0.0–0.7)
Neutro Abs: 8.4 10*3/uL — ABNORMAL HIGH (ref 1.7–7.7)
Neutrophils Relative %: 90 % — ABNORMAL HIGH (ref 43–77)

## 2010-10-29 LAB — VANCOMYCIN, TROUGH: Vancomycin Tr: 31.3 ug/mL (ref 10.0–20.0)

## 2010-10-29 NOTE — Telephone Encounter (Signed)
noted and am thankful the weaning is beginning

## 2010-10-29 NOTE — Group Therapy Note (Signed)
  [name], [name]             ACCOUNT NO.:  [id number]  MEDICAL RECORD NO.:  [id number]           PATIENT TYPE:  I  LOCATION:  IC03                          FACILITY:  [facility]  PHYSICIAN:  Wilson Singer, M.D.DATE OF BIRTH:  [birth year]  DATE OF PROCEDURE:  10/29/2010 DATE OF DISCHARGE:                                PROGRESS NOTE   This man has remained relatively stable on the ventilator overnight and there were no major events.  There was several PVCs, but today he appears to have less.  His oxygen requirement has gone down to 50%.  PHYSICAL EXAMINATION:  He is afebrile, hemodynamically stable, appears to be in sinus rhythm with a pulse of 89, saturating approximately 90% on 50% FIO2.  Heart sounds are present and appear to be in sinus rhythm. Lung fields are relatively clear this morning anteriorly.  He is sedated.  INVESTIGATIONS:  Hemoglobin 13.7, white blood cell count 9.4, platelets 157.  Sodium 143, potassium 3.0, which is being repeated, BUN 35, creatinine 0.95, pH 7.47, pCO2 of 39.3, pO2 of 83.4, saturation 96.7%.  IMPRESSION: 1. Ventilator-dependent respiratory failure secondary to bilateral     pneumonia.  There may be an element of congestive heart failure. 2. Chronic obstructive pulmonary disease.  PLAN: 1. Continue ventilation per Dr. Juanetta Gosling' recommendations. 2. Await echocardiogram.  I will be interested to see if he there is     an element of systolic dysfunction here.     Wilson Singer, M.D.     NCG/MEDQ  D:  10/29/2010  T:  10/29/2010  Job:  [id number]  Electronically Signed by Lilly Cove M.D. on 10/29/2010 10:52:24 AM

## 2010-10-30 ENCOUNTER — Inpatient Hospital Stay (HOSPITAL_COMMUNITY): Payer: Medicare Other

## 2010-10-30 DIAGNOSIS — I4891 Unspecified atrial fibrillation: Secondary | ICD-10-CM

## 2010-10-30 LAB — BASIC METABOLIC PANEL
CO2: 33 mEq/L — ABNORMAL HIGH (ref 19–32)
Chloride: 102 mEq/L (ref 96–112)
GFR calc Af Amer: >60 mL/min (ref >60)
Potassium: 3.8 mEq/L (ref 3.5–5.1)

## 2010-10-30 LAB — BLOOD GAS, ARTERIAL
Acid-Base Excess: 8 mmol/L — ABNORMAL HIGH (ref 0.0–2.0)
Bicarbonate: 31.5 mEq/L — ABNORMAL HIGH (ref 20.0–24.0)
FIO2: 45 %
Patient temperature: 37
TCO2: 26.6 mmol/L (ref 0–100)
pCO2 arterial: 39.6 mmHg (ref 35.0–45.0)
pH, Arterial: 7.512 — ABNORMAL HIGH (ref 7.350–7.450)

## 2010-10-30 LAB — CULTURE, RESPIRATORY W GRAM STAIN

## 2010-10-30 LAB — MAGNESIUM: Magnesium: 1.9 mg/dL (ref 1.5–2.5)

## 2010-10-31 ENCOUNTER — Telehealth: Payer: Self-pay | Admitting: Family Medicine

## 2010-10-31 ENCOUNTER — Inpatient Hospital Stay (HOSPITAL_COMMUNITY): Payer: Medicare Other

## 2010-10-31 LAB — BLOOD GAS, ARTERIAL
Acid-Base Excess: 10.8 mmol/L — ABNORMAL HIGH (ref 0.0–2.0)
Bicarbonate: 36.7 mEq/L — ABNORMAL HIGH (ref 20.0–24.0)
FIO2: 45 %
FIO2: 40 %
MECHVT: 600 mL
O2 Saturation: 95.3 %
PEEP: 5 cmH2O
Patient temperature: 37
Pressure support: 5 cmH2O
TCO2: 29 mmol/L (ref 0–100)
pCO2 arterial: 45.7 mmHg — ABNORMAL HIGH (ref 35.0–45.0)
pO2, Arterial: 57.9 mmHg — ABNORMAL LOW (ref 80.0–100.0)

## 2010-10-31 LAB — BASIC METABOLIC PANEL
Calcium: 8.8 mg/dL (ref 8.4–10.5)
Calcium: 9.1 mg/dL (ref 8.4–10.5)
GFR calc Af Amer: >60 mL/min (ref >60)
GFR calc Af Amer: >60 mL/min (ref >60)
GFR calc non Af Amer: >60 mL/min (ref >60)
GFR calc non Af Amer: >60 mL/min (ref >60)
Glucose, Bld: 111 mg/dL — ABNORMAL HIGH (ref 70–99)
Potassium: 2.7 mEq/L — CL (ref 3.5–5.1)
Potassium: 3.2 mEq/L — ABNORMAL LOW (ref 3.5–5.1)
Sodium: 139 mEq/L (ref 135–145)
Sodium: 142 mEq/L (ref 135–145)

## 2010-10-31 LAB — COMPREHENSIVE METABOLIC PANEL
ALT: 45 U/L (ref 0–53)
AST: 92 U/L — ABNORMAL HIGH (ref 0–37)
Albumin: 2.9 g/dL — ABNORMAL LOW (ref 3.5–5.2)
Alkaline Phosphatase: 58 U/L (ref 39–117)
BUN: 26 mg/dL — ABNORMAL HIGH (ref 6–23)
Chloride: 96 mEq/L (ref 96–112)
Creatinine, Ser: 0.69 mg/dL (ref 0.4–1.5)

## 2010-10-31 LAB — DIFFERENTIAL
Eosinophils Absolute: 0 10*3/uL (ref 0.0–0.7)
Eosinophils Relative: 0 % (ref 0–5)
Lymphs Abs: 0.5 10*3/uL — ABNORMAL LOW (ref 0.7–4.0)
Monocytes Absolute: 0.7 10*3/uL (ref 0.1–1.0)
Monocytes Relative: 6 % (ref 3–12)
Neutrophils Relative %: 90 % — ABNORMAL HIGH (ref 43–77)

## 2010-10-31 LAB — CBC
MCH: 30.8 pg (ref 26.0–34.0)
MCV: 97.2 fL (ref 78.0–100.0)
Platelets: 143 10*3/uL — ABNORMAL LOW (ref 150–400)
RBC: 4.71 MIL/uL (ref 4.22–5.81)

## 2010-10-31 LAB — MAGNESIUM: Magnesium: 1.7 mg/dL (ref 1.5–2.5)

## 2010-10-31 NOTE — Group Therapy Note (Signed)
  NAMEAHMARION, SARACENO             ACCOUNT NO.:  [id number]  MEDICAL RECORD NO.:  [id number]           PATIENT TYPE:  LOCATION:                                 FACILITY:  PHYSICIAN:  Jassiah Viviano L. Juanetta Gosling, M.D.DATE OF BIRTH:  October 09, 1921  DATE OF PROCEDURE: DATE OF DISCHARGE:                                PROGRESS NOTE   Patient of the [facility].  Mr. Bevins remains intubated on the ventilator.  He is much more alert and awake this morning.  He is improving.  I do not see any changes, and he overall appears better.  His blood pressure 134/101, pulse 85.  His I and O is -2515 yesterday and -473 today.  His weight was 105 kg yesterday, today's weight is not in yet.  Potassium is 2.7, so we need to replace that.  His blood gas shows a pO2 of 77, pCO2 of 42, pH 7.52.  Chest x-ray looks, I believe, about the same.  ASSESSMENT:  He is better.  Plan is to see if he can be extubated today. I think he has got at least a fair chance of being able to be extubated. He will have his potassium replaced.  We will continue with all the other treatments and follow.     Yeraldine Forney L. Juanetta Gosling, M.D.     ELH/MEDQ  D:  10/31/2010  T:  10/31/2010  Job:  [id number]  Electronically Signed by Kari Baars M.D. on 10/31/2010 12:45:50 PM

## 2010-10-31 NOTE — Group Therapy Note (Signed)
  [name], [name]             ACCOUNT NO.:  [id number]  MEDICAL RECORD NO.:  [id number]           PATIENT TYPE:  I  LOCATION:  IC03                          FACILITY:  [facility]  PHYSICIAN:  Lotus Gover L. Juanetta Gosling, M.D.DATE OF BIRTH:  1922-03-04  DATE OF PROCEDURE: DATE OF DISCHARGE:                                PROGRESS NOTE   Patient of [facility].  SUBJECTIVE:  [name] is overall I think about the same.  He had a little bit less problem with cardiac arrhythmias.  He remains intubated and on the ventilator.  I discussed his case with Dr. Karilyn Cota and he has initiated discussion with family about potential end-of-life issues and family is in agreement that if things do not go well over the next several days that we can discuss termination of life support, etc., but at this point we both feel that, that would be premature.  He is I think better this morning.  He has no new complaints.  His exam shows his blood pressure 104/70, pulse 89.  His I and O -2043 then +9, then +243, weight is about the same.  His lab work shows his BUN is 35, creatinine is 0.95, potassium is three.  He is getting some potassium replacement.  His pH is 7.47, pCO2 is 39, pO2 of 83.  His white blood count is 9400, hemoglobin is 13.7, platelets 157.  Respiratory culture from the 5th shows no organisms.  He did have one blood culture that shows coag-negative staph, which I suspect maybe contaminant.  At any rate, he seems to be doing better.  I think we should go ahead and see when he can do as far as weaning, but I am reluctant to extubate him today.  We will continue with all the other treatments, antibiotics, etc., and follow.     Anthon Harpole L. Juanetta Gosling, M.D.     ELH/MEDQ  D:  10/29/2010  T:  10/29/2010  Job:  [id number]  Electronically Signed by Kari Baars M.D. on 10/31/2010 12:45:39 PM

## 2010-10-31 NOTE — Group Therapy Note (Signed)
  NAMEILYAAS, MUSTO             ACCOUNT NO.:  [id number]  MEDICAL RECORD NO.:  [id number]           PATIENT TYPE:  LOCATION:                                 FACILITY:  PHYSICIAN:  Alyric Parkin L. Juanetta Gosling, M.D.DATE OF BIRTH:  1922-03-02  DATE OF PROCEDURE: DATE OF DISCHARGE:                                PROGRESS NOTE   Patient of [facility].  Mr. Norville I think is about the same.  He is much improved from that when he was admitted, but still remains somewhat confused, somewhat short of breath.  I discussed his situation with Dr. Karilyn Cota and apparently there was a history of him being good mentally when his O2 sats in the high 80s or low 90s, but when he gets above that he gets confused and our thinking is that he probably has CO2 retention with his O2 gets too high.  Based on his blood gases on BiPAP, I think that certainly is a possibility.  PHYSICAL EXAMINATION:  GENERAL:  He is mildly confused.  He is awake. VITAL SIGNS:  His pulse is in the 90s.  Blood pressure about 90/70. CHEST: Some rhonchi bilaterally.  I do not hear any rales now. HEART:  Regular. ABDOMEN:  Fairly soft.  LABORATORY WORK:  BMET shows his glucose is 148, BUN 76, creatinine 1.33, so that has come down some.  CBC shows white count is 9700, hemoglobin is 14.5, and platelets 184 and blood gas on 40% and BiPAP showed a pH of 7.28, pCO2 of 52, pO2 of 109.  My interpretation is that he has probably a combined metabolic and respiratory acidemia and I would plan to continue with his treatments and follow.  I think it is okay to move him out of the Intensive Care Unit and I think our goal for his oxygenation would be for him to be in the high 80s and low 90s, I will be gone tomorrow, but back on the 6th.     Kohner Orlick L. Juanetta Gosling, M.D.     ELH/MEDQ  D:  10/26/2010  T:  10/26/2010  Job:  [id number]  Electronically Signed by Kari Baars M.D. on 10/31/2010 12:45:35 PM

## 2010-10-31 NOTE — Telephone Encounter (Signed)
noted 

## 2010-10-31 NOTE — Group Therapy Note (Signed)
  NAMEGLYNN, YEPES             ACCOUNT NO.:  [id number]  MEDICAL RECORD NO.:  [id number]           PATIENT TYPE:  LOCATION:                                 FACILITY:  PHYSICIAN:  Keamber Macfadden L. Juanetta Gosling, M.D.DATE OF BIRTH:  06-22-1922  DATE OF PROCEDURE: DATE OF DISCHARGE:                                PROGRESS NOTE   Mr. Robey was set for transfer to the floor, seemed to be developing increasing shortness of breath, and eventually ended up having to be intubated.  He remains intubated and on the ventilator and sedated.  PHYSICAL EXAMINATION:  VITAL SIGNS:  His blood pressure is 125/67. Pulse 104.  His respirations about 16.  His weight has come down to 103.6 kg from 108.5. CHEST:  Some rhonchi bilaterally, but I think is clear in general. HEART:  Regular without gallop. ABDOMEN:  Soft and I and O yesterday -2043, today +120. GENERAL:  Overall, I think he is better.  His blood gas shows on 60% 600 rate of 14 and 5 of PEEP with pH of 7.51, pCO2 of 34.8, and pO2 of 96 and his ventilator is being adjusted.  CBC shows white count is 9800, hemoglobin 14.6, and platelets 174. Metabolic profile shows a BUN of 42, creatinine 0.96, and albumin is 3.  ASSESSMENT:  Overall, he is obviously worse having to be on the ventilator, but he looks pretty comfortable.  His chest seems to be clearing.  His heart is regular and I think we should continue with current treatments.  He is diuresed nicely and we are able to reduce his FiO2 and respiratory rate on the ventilator.     Goldie Tregoning L. Juanetta Gosling, M.D.     ELH/MEDQ  D:  10/28/2010  T:  10/28/2010  Job:  [id number]  Electronically Signed by Kari Baars M.D. on 10/31/2010 12:45:43 PM

## 2010-10-31 NOTE — Consult Note (Signed)
NAMERUBE, SANCHEZ             ACCOUNT NO.:  [id number]  MEDICAL RECORD NO.:  [id number]           PATIENT TYPE:  LOCATION:                                 FACILITY:  PHYSICIAN:  Deedee Lybarger L. Juanetta Gosling, M.D.DATE OF BIRTH:  1922/02/17  DATE OF CONSULTATION: DATE OF DISCHARGE:                                CONSULTATION   REASON FOR CONSULTATION:  Respiratory failure.  HISTORY:  [name] is an 77 year old who has a complicated past medical history that includes coronary artery occlusive disease, chronic atrial fibrillation.  He has had a porcine mitral valve replacement, has history of prostate cancer, gout, COPD on home O2.  He has been fairly independent at home but over the last several days has had increasing problems with cough, wheezing, shortness of breath.  He has not been eating or drinking well.  In the past, he has taken antibiotics in these kind of situations and it has improved, but he did not improve this time.  He was brought to the emergency room and was found to have acute respiratory failure, atrial fibrillation with rapid ventricular response, a very elevated proBNP.  PAST MEDICAL HISTORY:  Positive for: 1. COPD. 2. Hypertension. 3. History of gastric ulcer disease. 4. GERD. 5. Gout. 6. Hyperlipidemia. 7. Prostate cancer. 8. Previous valve replacement. 9. He has also had coronary artery bypass surgery according to the     chart.  SOCIAL HISTORY:  He has about a previous 50-60-pack-year smoking history, then stopped for 16 years and then started back last year but apparently, he is not smoking very much in the last year.  FAMILY HISTORY:  Really not very contributory considering his advanced age.  REVIEW OF SYSTEMS:  Except as mentioned is negative.  PHYSICAL EXAMINATION:  GENERAL:  He is sleepy but will arouse. VITAL SIGNS:  His pulse is in the 90s, blood pressure in the 90s, O2 sat in the 90s. HEENT:  His pupils are reactive.  Mucous membranes  are slightly dry. Nose and throat are clear. NECK:  Supple without masses. CHEST:  Rhonchi bilaterally. HEART:  Regular without gallop. ABDOMEN:  Soft without masses. EXTREMITIES:  Trace edema.  LABORATORY WORK:  Cardiac panel does not show evidence of acute infarction.  His blood gas this morning on 40% and BiPAP shows pH of 7.21, pCO2 is 61.5, pO2 is 111, so it is about what it was when he came into the hospital.  Blood cultures are negative, but they are less than 24 hours old.  Procalcitonin is 0.26.  Phosphorus 5.9, magnesium is 2.8. BMET shows his BUN is 87, creatinine 2.4.  ProBNP 21,327.  CBC shows white count of 13,100, hemoglobin 15.7.  Lactate was normal at 1.2.  Chest x-ray from yesterday shows cardiomegaly, no definite acute findings.  He does not seem to have pulmonary edema.  ASSESSMENT:  I think this is probably more of a chronic obstructive pulmonary disease exacerbation despite the very elevated proBNP.  He has significant renal dysfunction and based on a BMET from 2010, at that time his BUN was 9 and creatinine was 0.9.  We do not have anything more  recent in the computer but that is a significant change, of course, in two years and he may simply be dehydrated.  At this point, I will continue with bilevel positive airway pressure despite the elevated proBNP and the fact that he has got an ejection fraction that is listed at about 40%.  I think he may have more problems with volume depletion. Antibiotic coverage I think is okay.  He may need a stress dose of steroids.  I believe he has been on and off of steroids in the last year or so although his daughter is not really able to provide a definite history of that, but at this point, I think continue what we are doing, continue with his antibiotics, nebulizer treatments, bilevel positive airway pressure, and follow.     Bonnie Roig L. Juanetta Gosling, M.D.     ELH/MEDQ  D:  10/24/2010  T:  10/24/2010  Job:   [id number]  cc:   Dr. Lodema Hong  Electronically Signed by Kari Baars M.D. on 10/31/2010 12:45:23 PM

## 2010-10-31 NOTE — Group Therapy Note (Signed)
  [name], [name]             ACCOUNT NO.:  [id number]  MEDICAL RECORD NO.:  [id number]           PATIENT TYPE:  I  LOCATION:  IC03                          FACILITY:  [facility]  PHYSICIAN:  Kamdyn Covel L. Juanetta Gosling, M.D.DATE OF BIRTH:  12/21/1921  DATE OF PROCEDURE:  10/30/2010 DATE OF DISCHARGE:                                PROGRESS NOTE   HISTORY OF PRESENT ILLNESS:  Mr. Kisiel is a patient with [facility].  He seems to be much improved.  He is intubated and on a ventilator.  He has diuresed significantly and seems to be better.  I do not see any other new changes right now.  PHYSICAL EXAMINATION:  His blood pressure is 181/93 and pulse is 93. His INR -11.61 yesterday.  His weight has come down a bit from 108.1 kg yesterday to 105 today.  His blood gas shows a pH of 7.51, pCO2 of 39, pO2 of 69, and his hemoglobin is 13.7.  His chest is clearer.  He looks better.  Chest x-ray is not available yet.  ASSESSMENT:  I think he is better.PLAN:  We are going to try weaning today and see how he does.  No other new changes at this point.     Rodrick Payson L. Juanetta Gosling, M.D.     ELH/MEDQ  D:  10/30/2010  T:  10/30/2010  Job:  [id number]  Electronically Signed by Kari Baars M.D. on 10/31/2010 12:45:47 PM

## 2010-10-31 NOTE — Group Therapy Note (Signed)
  NAMEARAF, CLUGSTON             ACCOUNT NO.:  [id number]  MEDICAL RECORD NO.:  [id number]           PATIENT TYPE:  I  LOCATION:  IC03                          FACILITY:  [facility]  PHYSICIAN:  Axtyn Woehler L. Juanetta Gosling, M.D.DATE OF BIRTH:  Jun 13, 1922  DATE OF PROCEDURE: DATE OF DISCHARGE:                                PROGRESS NOTE   [name] is admitted with multifactorial respiratory failure.  He has had what sounds like an outpatient sleep study at his home according to his daughter, but he has not been on CPAP and she says that she has noticed that when he goes to sleep even here in the hospital that his O2 sat tends to drop.  I am not sure if he is actually having apneic episodes.  He is on BiPAP, so I think it is less likely that he is actually having apnea now.  At any rate, this morning he looks much better.  He is much more awake and alert, looks more comfortable, does not seem to be having any new problems.  Blood culture so far negative x1.  The other one shows gram-positive cocci in clusters and gram-negative rods.  Full identification is pending.  White blood count is 10,900, hemoglobin 14.2, platelets 197. Comprehensive metabolic profile shows BUN is 88, creatinine 1.77, it is a little bit better.  Chest x-ray from this morning shows cardiomegaly without congestive failure and a suggestion of a left patchy opacity. His chest is actually fairly clear to auscultation.  His heart rate is 94, blood pressure 113/69, O2 sats 93%.  He has still been on BiPAP.  He is now sitting up and eating breakfast.  My assessment then is that he has multiple problems, but seems to be improving.  He has what may be sleep apnea.  I will try to see if I can get that report.  He has chronic obstructive pulmonary disease.  He has cardiac disease.  He probably has some congestive heart failure, but he also seems to have some problems with renal dysfunction, which may be from dehydration.  I do  not plan to change anything at this point.  We will continue with his meds and treatments.     Dominick Zertuche L. Juanetta Gosling, M.D.     ELH/MEDQ  D:  10/25/2010  T:  10/25/2010  Job:  [id number]  Electronically Signed by Kari Baars M.D. on 10/31/2010 12:45:28 PM

## 2010-11-01 LAB — CBC
HCT: 46.4 % (ref 39.0–52.0)
Platelets: 140 10*3/uL — ABNORMAL LOW (ref 150–400)
RBC: 4.74 MIL/uL (ref 4.22–5.81)
RDW: 14.5 % (ref 11.5–15.5)
WBC: 12.8 10*3/uL — ABNORMAL HIGH (ref 4.0–10.5)

## 2010-11-01 LAB — BASIC METABOLIC PANEL
Chloride: 96 mEq/L (ref 96–112)
GFR calc non Af Amer: >60 mL/min (ref >60)
Glucose, Bld: 124 mg/dL — ABNORMAL HIGH (ref 70–99)
Potassium: 4.1 mEq/L (ref 3.5–5.1)
Sodium: 140 mEq/L (ref 135–145)

## 2010-11-01 LAB — MAGNESIUM: Magnesium: 2.3 mg/dL (ref 1.5–2.5)

## 2010-11-01 LAB — DIFFERENTIAL
Basophils Absolute: 0 10*3/uL (ref 0.0–0.1)
Eosinophils Relative: 0 % (ref 0–5)
Lymphocytes Relative: 3 % — ABNORMAL LOW (ref 12–46)
Neutrophils Relative %: 90 % — ABNORMAL HIGH (ref 43–77)

## 2010-11-02 LAB — COMPREHENSIVE METABOLIC PANEL
ALT: 68 U/L — ABNORMAL HIGH (ref 0–53)
AST: 73 U/L — ABNORMAL HIGH (ref 0–37)
Alkaline Phosphatase: 62 U/L (ref 39–117)
CO2: 38 mEq/L — ABNORMAL HIGH (ref 19–32)
Chloride: 96 mEq/L (ref 96–112)
GFR calc Af Amer: >60 mL/min (ref >60)
GFR calc non Af Amer: >60 mL/min (ref >60)
Potassium: 3 mEq/L — ABNORMAL LOW (ref 3.5–5.1)
Sodium: 138 mEq/L (ref 135–145)
Total Bilirubin: 1.5 mg/dL — ABNORMAL HIGH (ref 0.3–1.2)

## 2010-11-02 LAB — DIFFERENTIAL
Basophils Absolute: 0 10*3/uL (ref 0.0–0.1)
Basophils Relative: 0 % (ref 0–1)
Neutro Abs: 12.7 10*3/uL — ABNORMAL HIGH (ref 1.7–7.7)
Neutrophils Relative %: 89 % — ABNORMAL HIGH (ref 43–77)

## 2010-11-02 LAB — CBC
Hemoglobin: 13.6 g/dL (ref 13.0–17.0)
RBC: 4.44 MIL/uL (ref 4.22–5.81)

## 2010-11-03 LAB — COMPREHENSIVE METABOLIC PANEL
ALT: 63 U/L — ABNORMAL HIGH (ref 0–53)
AST: 57 U/L — ABNORMAL HIGH (ref 0–37)
Calcium: 8.8 mg/dL (ref 8.4–10.5)
Creatinine, Ser: 0.72 mg/dL (ref 0.4–1.5)
GFR calc Af Amer: >60 mL/min (ref >60)
GFR calc non Af Amer: >60 mL/min (ref >60)
Glucose, Bld: 105 mg/dL — ABNORMAL HIGH (ref 70–99)
Sodium: 139 mEq/L (ref 135–145)
Total Protein: 5.7 g/dL — ABNORMAL LOW (ref 6.0–8.3)

## 2010-11-03 LAB — CBC
MCH: 30.8 pg (ref 26.0–34.0)
MCHC: 31 g/dL (ref 30.0–36.0)
RDW: 14.4 % (ref 11.5–15.5)

## 2010-11-03 LAB — DIFFERENTIAL
Basophils Absolute: 0 10*3/uL (ref 0.0–0.1)
Basophils Relative: 0 % (ref 0–1)
Eosinophils Absolute: 0 10*3/uL (ref 0.0–0.7)
Eosinophils Relative: 0 % (ref 0–5)
Monocytes Absolute: 0.9 10*3/uL (ref 0.1–1.0)
Monocytes Relative: 7 % (ref 3–12)

## 2010-11-03 NOTE — Discharge Summary (Signed)
[name], [name]             ACCOUNT NO.:  [id number]  MEDICAL RECORD NO.:  [id number]           PATIENT TYPE:  I  LOCATION:  A203                          FACILITY:  [facility]  PHYSICIAN:  Hillery Aldo, M.D.   DATE OF BIRTH:  03/27/1922  DATE OF ADMISSION:  10/23/2010 DATE OF DISCHARGE:  05/12/2012LH                              DISCHARGE SUMMARY   PRIMARY CARE PHYSICIAN:  Milus Mallick. Lodema Hong, MD  CARDIOLOGIST:  [name]. Dietrich Pates, MD, [facility]  PULMONOLOGIST:  Oneal Deputy. Juanetta Gosling, MD  DISCHARGE DIAGNOSES: 1. Acute-on-chronic respiratory failure status post mechanical     ventilation and extubation. 2. Chronic obstructive pulmonary disease with acute exacerbation. 3. Sepsis secondary to community-acquired pneumonia. 4. Acute renal failure. 5. Hypokalemia. 6. Hyperbilirubinemia with elevated liver function studies. 7. Hypoalbuminemia. 8. Thrombocytopenia. 9. Atrial fibrillation. 10.Acute diastolic congestive heart failure. 11.Coronary artery disease. 12.Non-ST elevation myocardial infarction secondary to demand     ischemia. 13.Hypertension. 14.Status post mitral valve replacement. 15.Dyslipidemia. 16.Gout. 17.History of peptic ulcer disease. 18.Gastroesophageal reflux disease.  CONSULTATIONS: 1. Edward L. Juanetta Gosling, MD of Pulmonology. 2. [name]. Dietrich Pates, MD, [facility] of Cardiology.  BRIEF ADMISSION HISTORY OF PRESENT ILLNESS:  The patient is an 77 year old male with home oxygen dependent chronic obstructive pulmonary disease who presented to the hospital with a 4-day history of cough, worsening dyspnea, diminished appetite, and generalized failure to thrive.  The patient was evaluated in the emergency department and found to be in acute respiratory failure.  He subsequently was stabilized and referred to the hospitalist service for further evaluation and treatment.  For full details, please see the dictated report done by Dr. Houston Siren.  PROCEDURES AND DIAGNOSTIC  STUDIES: 1. Serial chest radiographs performed on Oct 23, 2010, Oct 24, 2010, Oct 25, 2010, Oct 26, 2010, Oct 27, 2010, Oct 28, 2010, Oct 29, 2010, Oct 30, 2010, and Oct 31, 2010, were completed.  The initial chest     radiograph showed no acute findings.  On an Oct 25, 2010, patchy     left base opacity was noted.  On Oct 27, 2010, pulmonary edema     pattern was appreciated.  On Oct 28, 2010, there was worsening     aeration with both CHF and bilateral pneumonia appreciated.  On Oct 29, 2010, there began to be some interval improvement with the final     chest radiograph showing no significant changes with a steady     improvement. 2. Two-dimensional echocardiogram done on Oct 24, 2010, showed low     normal systolic function with an ejection fraction estimated at 50-     55%.  Septal motion, showed paradoxical movement.  DISCHARGE LABORATORY VALUES:  Sodium was 139, potassium 3.6, chloride 100, bicarb 37, BUN 32, creatinine 0.72, glucose 105, calcium 8.8, total bilirubin 1.1, alkaline phosphatase 63, AST 57, ALT 63, total protein 5.7, albumin 2.7.  White blood cell count was 13, hemoglobin 13.1, hematocrit 42.2, platelets 132.  Respiratory cultures showed mixed flora.  Blood cultures showed one positive for coagulase-negative staph, felt to be a  contaminant.  HOSPITAL COURSE BY PROBLEM: 1. Acute-on-chronic respiratory failure:  The patient has known     chronic respiratory failure and is home oxygen dependent.  He     ultimately required mechanical ventilation and intubation for     stabilization of his respiratory status.  He was successfully     extubated on Oct 31, 2010, and was co-managed with Dr. Kari Baars of Pulmonology.  The patient has done well post extubation     and has been able to maintain his oxygen saturations.  At this     point, we will set him up with home oxygen therapy along with     nocturnal CPAP.  His respiratory function appears to be at baseline      presently. 2. Chronic obstructive pulmonary disease with acute exacerbation:  The     patient was put on stress dose Solu-Cortef and given bronchodilator     therapy as well as empiric antibiotics.  His acute exacerbation     appears resolved. 3. Sepsis secondary to community-acquired pneumonia:  The patient did     develop infiltrates on chest radiography consistent with pneumonia     and has done well on therapy with Avelox.  We will discharge him on     Avelox for an additional 4 days of treatment.  Sputum cultures grew     mixed flora. 4. Acute renal failure:  The patient did have acute renal failure     secondary to sepsis on initial presentation.  The patient was     gently hydrated, and his ACE inhibitor was held with subsequent     resolution of his renal failure.  Creatinine on admission was 2.5     and at discharge creatinine is 0.72. 5. Hypokalemia:  The patient's potassium was monitored closely and     repleted as needed.  He will be discharged on routine     supplementation therapy. 6. Hyperbilirubinemia/elevated liver function studies:  This was felt     to be due to sepsis and passive congestion of the liver from     congestive heart failure.  His LFTs are trending down and his     bilirubin has normalized. 7. Hypoalbuminemia:  Felt to be secondary to sepsis and an acute phase     reaction to inflammation.  He does not appear malnourished     clinically. 8. Thrombocytopenia:  Felt to be secondary to sepsis.  The patient's     platelet count is only mildly reduced and is not felt to be of any     clinical significance at this time. 9. Atrial fibrillation:  The patient was seen and evaluated by Dr.     Dietrich Pates of Cardiology.  He is not felt to be a good Coumadin     candidate.  His heart rate is currently controlled, and he was put     on aspirin therapy.  He can follow up with Dr. Dietrich Pates as an     outpatient in 2-3 weeks. 10.Acute diastolic congestive heart failure:   The patient's diastolic     heart failure was treated with aggressive diuresis.  At this point,     he appears to be well compensated clinically, but he will be     discharged on low-dose Lasix therapy.  He is on an ACE inhibitor. 11.History of coronary artery disease/non-ST elevation MI secondary to     demand ischemia:  The patient did develop  a bump in his troponins,     which were felt to be due to demand ischemia in the setting of     severe sepsis.  He has been maintained on aspirin therapy and has     not been symptomatic with regard to chest pain.  A two-dimensional     echocardiogram did not show any focal regional wall motion     abnormalities.  He will follow up with Dr. Dietrich Pates as an     outpatient. 12.Hypertension:  The patient's blood pressure is currently well     controlled. 13.Status post mitral valve replacement:  This has been stable. 14.Dyslipidemia:  The patient has been maintained on statin therapy,     which he will continue post discharge. 15.Gout:  The patient has not had any acute gout flares. 16.History of peptic ulcer disease/gastroesophageal reflux disease:     The patient was maintained on proton pump inhibitor therapy.  DISPOSITION:  The patient is medically stable and will be discharged home with full supportive services including PT and home health nursing. We will also set him up with a home nebulizer machine for nebulized bronchodilator therapy, and he has been encouraged to follow up with Dr. Dietrich Pates and Dr. Juanetta Gosling in 2-3 weeks.  He should follow up with his primary care physician in 1 week.  DISCHARGE MEDICATIONS: 1. Aspirin 325 mg p.o. daily. 2. Lasix 20 mg p.o. daily. 3. Atrovent 0.5 mg inhaled q.6 h. via nebulizer machine. 4. Xopenex 1.25 mg inhaled every 6 hours via nebulizer machine 5. Levaquin 500 mg p.o. daily x4 days. 6. Neomycin/polymyxin HC ophthalmic suspension 2 drops both eyes     b.i.d. 7. Potassium chloride 20 mEq p.o.  daily. 8. Allopurinol 300 mg p.o. daily. 9. Benazepril 20 mg p.o. b.i.d. 10.Tessalon Perles 100 mg p.o. t.i.d. p.r.n. cough. 11.Lovastatin 1 tablet p.o. at bedtime on Sundays, Mondays,     Wednesdays, and Fridays. 12.Metoprolol tartrate 50 mg p.o. b.i.d. 13.Multivitamin 1 tablet p.o. daily. 14.Omeprazole 40 mg p.o. daily. 15.Tramadol 50 mg p.o. b.i.d.  CONDITION ON DISCHARGE:  Improved.  Time spent coordinating care for discharge and discharge instructions including face-to-face time and coordinating care with the patient's daughter equals 45 minutes.     Hillery Aldo, M.D.     CR/MEDQ  D:  11/03/2010  T:  11/03/2010  Job:  [id number]  cc:   Milus Mallick. Lodema Hong, M.D. Fax: [phone]  [name]. Dietrich Pates, MD, [facility] [address] [place], [place] [address]  Oneal Deputy. Juanetta Gosling, M.D. Fax: [phone]  Electronically Signed by Hillery Aldo M.D. on 11/03/2010 04:31:07 PM

## 2010-11-05 ENCOUNTER — Inpatient Hospital Stay
Admission: RE | Admit: 2010-11-05 | Discharge: 2010-11-16 | Disposition: A | Discharge status: Home / Self Care | Payer: Self-pay | Source: Ambulatory Visit | Attending: Internal Medicine | Admitting: Internal Medicine

## 2010-11-05 LAB — COMPREHENSIVE METABOLIC PANEL
Alkaline Phosphatase: 79 U/L (ref 39–117)
BUN: 23 mg/dL (ref 6–23)
CO2: 38 mEq/L — ABNORMAL HIGH (ref 19–32)
Chloride: 97 mEq/L (ref 96–112)
Creatinine, Ser: 0.84 mg/dL (ref 0.4–1.5)
GFR calc non Af Amer: >60 mL/min (ref >60)
Glucose, Bld: 86 mg/dL (ref 70–99)
Potassium: 3.6 mEq/L (ref 3.5–5.1)
Total Bilirubin: 1 mg/dL (ref 0.3–1.2)

## 2010-11-05 LAB — CBC
HCT: 41.5 % (ref 39.0–52.0)
MCH: 30.6 pg (ref 26.0–34.0)
MCV: 99.3 fL (ref 78.0–100.0)
Platelets: 150 10*3/uL (ref 150–400)
RBC: 4.18 MIL/uL — ABNORMAL LOW (ref 4.22–5.81)
WBC: 10.7 10*3/uL — ABNORMAL HIGH (ref 4.0–10.5)

## 2010-11-05 LAB — DIFFERENTIAL
Eosinophils Absolute: 0.2 10*3/uL (ref 0.0–0.7)
Lymphocytes Relative: 10 % — ABNORMAL LOW (ref 12–46)
Lymphs Abs: 1.1 10*3/uL (ref 0.7–4.0)
Monocytes Relative: 11 % (ref 3–12)
Neutrophils Relative %: 76 % (ref 43–77)

## 2010-11-06 NOTE — Consult Note (Signed)
NAME:  [name], [name] NO.:  [id number]   MEDICAL RECORD NO.:  [id number]          PATIENT TYPE:  INP   LOCATION:  A315                          FACILITY:  [facility]   PHYSICIAN:  Pricilla Riffle, MD, FACCDATE OF BIRTH:  1922/02/06   DATE OF CONSULTATION:  01/30/2007  DATE OF DISCHARGE:                                 CONSULTATION   IDENTIFICATION:  [name] is an 77 year old who we are asked to see  regarding preop risk stratification.   HISTORY OF PRESENT ILLNESS:  The patient has a history of known coronary  artery disease, status post CABG with LIMA to LAD back in 2003, also has  a history of mitral valve disease, status post replacement  (bioprosthesis in 2003).  The patient underwent a modified Cox-Maze  procedure at that time but has remained in atrial fibrillation, stopped  Coumadin because of GI bleeding back in 2006.  The patient is followed  by Dr. Lodema Hong, has not been seen in cardiology for several years.   He was admitted with hematuria a couple days ago, treated for UTI, found  to have prostatic hypertrophy with bladder neck obstruction by probable  clot.  Plan for cystoscopy with biopsy, possible TURP.   Again on talking to the patient, he has been off Coumadin for several  years because of bleeding, GI.  His activity is decreased because of  osteoarthritis of his knees.  On weekends he gets out a little.  He  denies chest pain.  No dizziness.  No syncope.  No shortness of breath  with walking in house.   ALLERGIES:  NONE.   PAST MEDICAL HISTORY:  1. Atrial fibrillation.  2. CAD.  Cardiac catheterization in 2003 showed a 50% LAD lesion, 30-      40% circumflex, 60% OM, RCA was dominant with a 50% distal lesion,      EF was 35% with akinesis of the distal anterior wall, 3 to 4+ MR      (preoperatively).  Last echocardiogram in 2006.  LVF was 40%.  3. Nonischemic cardiomyopathy.  4. History of colonic AVMs.  5. History of peptic ulcer disease.  6. History of hypertension.  7. Peripheral vascular disease with chronically occluded right ICA.  8. History of small infrarenal AAA and iliac aneurysms.  9. COPD.  10.Gout.  11.Dyslipidemia.  12.Anemia.   PAST SURGICAL HISTORY:  1. CABG with MVR.  2. Cataract.  3. Appendectomy.  4. Hernia repair.   SOCIAL HISTORY:  The patient had quit tobacco for a long time and had  about and 80-pack-year history of smoking but he has been back to  smoking about a pack per week over the past couple of years, quit EtOH,  again had significant history in the past.  He is widowed, has family  nearby.   FAMILY HISTORY:  CVA in the brother.  Positive for cancer.   REVIEW OF SYSTEMS:  All systems reviewed, negative for the above problem  except as noted.   PHYSICAL EXAM:  The patient currently denies chest pain.  No shortness  of breath.  VITAL SIGNS:  Blood pressure is 102-137 systolic/66-73, pulse has been  in the 80s to 90s.  Temperature is 98.  I&Os yesterday was 8, 9 2.  HEENT:  Normocephalic, atraumatic.  EOMI.  PERRL.  No audible bruits.  No thyromegaly.  LUNGS:  Relatively clear but decreased air flow.  No rales.  CARDIAC:  Irregularly irregular S1 and S2.  No S3.  Grade 1/6 systolic  murmur heard best at the apex.  ABDOMEN:  Supple, no hepatomegaly, mild pelvic tenderness.  Normal bowel  sounds.  EXTREMITIES:  Feet warm.  No lower extremity edema.  Trace posterior  tibial pulses.   A 12-lead EKG shows atrial fibrillation at a rate of 89 beats per  minute.  Occasional PVCs.  Anterior MI.  IVCD (left bundle morphology  with ST changes, cannot completely exclude ischemia.  No old EKGs to  compare.  QRS duration of 116-124 milliseconds.   LABORATORY DATA:  Hemoglobin of 9.4, wbc of 8.5.  BUN and creatinine of  8 and 1, potassium of 3.7.   CT exam with probable clot in the bladder, BPH; common iliac aneurysms,  1.7 on the right and 1.4 on the left, 3.8 cm AAA infrarenal.    MEDICATIONS:  Current medicines include  1. Levaquin 500 daily.  2. Nicotine patch.  3. Benazepril 20 b.i.d.  4. Flomax.  5. Simvastatin 20.  6. Allopurinol 300.  7. Protonix 40.  8. Metoprolol 50 b.i.d.  9. Lasix 20 IV p.r.n.Marland Kitchen  10.IV fluids 80 mL per hour.   IMPRESSION:  The patient is an 77 year old with history of coronary  artery disease status post coronary artery bypass grafting and mitral  valve disease status post MVR and atrial fibrillation off Coumadin  because of gastrointestinal bleeding.  History of occluded right  internal carotid artery, chronic.  Echocardiogram with mild left  ventricular dysfunction in 2006.  He is admitted with anemia, hematuria.  Plan for cystostomy, possible transurethral resection of the prostate.   The patient is not very active secondary to severe osteoarthritis of the  knees.  For the activity he does do, walking around the house, he denies  chest pain, shortness of breath.  On exam, no evidence of congestive  heart failure, decreased peripheral pulses.  Labs significant for  anemia.  EKG with conduction delay that cannot exclude ischemia.  Possible anterior myocardial infarction.  No old EKGs to compare.   The patient is at mild to moderate risk for perioperative event,  increased congestive heart failure, ischemia, no active ischemia at  rest.  Would recommend echo to reevaluate left ventricular function.  Continue beta blocker and not on aspirin.   If the ejection fraction is relatively unchanged, feel it is okay to  proceed with surgery today as options are limited with bleeding.  Continue beta blocker, watch fluids closely, strict Is and Os, will  continue to follow, keep on telemetry, check lipids, question ASA with  GI history.  Will continue to follow.      Pricilla Riffle, MD, [facility]  Electronically Signed     PVR/MEDQ  D:  01/30/2007  T:  01/30/2007  Job:  [id number]

## 2010-11-06 NOTE — Op Note (Signed)
[name], [name]             ACCOUNT NO.:  [id number]   MEDICAL RECORD NO.:  [id number]          PATIENT TYPE:  INP   LOCATION:  A328                          FACILITY:  [facility]   PHYSICIAN:  Dennie Maizes, M.D.   DATE OF BIRTH:  Feb 06, 1922   DATE OF PROCEDURE:  08/04/2008  DATE OF DISCHARGE:                               OPERATIVE REPORT   PREOPERATIVE DIAGNOSES:  Hematuria and clot retention.   POSTOPERATIVE DIAGNOSES:  Hematuria, clot retention, and bleeding from  prostate.   OPERATIVE PROCEDURES:  Cystoscopy, evacuation of blood clots, and  transurethral resection of the prostate.   ANESTHESIA:  Final.   SURGEON:  Dennie Maizes, MD   COMPLICATIONS:  None.   ESTIMATED BLOOD LOSS:  Minimal, about 100 mL.   DRAINS:  A 22-French triple-lumen Foley catheter was done with 30 mL  balloon in the bladder.   SPECIMEN:  Prostate chips which was sent for histopathological  examination.   COMPLICATIONS:  None.   INDICATIONS FOR THE PROCEDURE:  An 77 year old male who was admitted to  the hospital with hematuria and clot retention.  He has a history of  prostate bleeding in the past.  His CT scan was negative for urinary  tract lesions.  He was taken to the operating room today for the  cystoscopy, evacuation of blood clots, possible transurethral resection  of the prostate, or transurethral resection of the bladder tumor.   DESCRIPTION OF PROCEDURE:  Spinal anesthesia was induced and the patient  was placed on the OR table in the dorsal lithotomy position.  The  indwelling Foley catheter was removed.  Lower abdomen and genitalia were  prepped and draped in a sterile fashion.  Cystoscopy was done with a 22-  [place] scope.  Urethra was normal.  There was no evidence of previous  TURP, but there was regrowth of prostatic adenoma with obstruction.  Bullous edema of the bladder neck and prostatic urothelium were noted.  Bladder was found to be trabeculated.  There was large  amounts of blood  clots in the bladder.  With an [facility] evacuator, the blood clots were  removed.  The blood clots measured about 400 mL in volume.  Examination  of the bladder revealed no evidence of any foreign body or tumor in the  bladder.  The cystoscope was then removed.   A 28-French Iglesias resectoscope with continuous bladder irrigation was  then inserted to the bladder.  The obstructing adenoma at 6 o'clock  position was resected first.  The right and left lateral lobes were  resected up to the level of the capsule.  Finally, resection was  done  in the anterior midline area.  All the prostate chips were removed and  sent for histopathological examination.  The prostatic fossa was then  closely examined and complete hemostasis was obtained by cauterization.  The estimated blood loss was about 100 mL.  Instruments were removed.  A  22-French triple-lumen Foley catheter with 30 mL balloon was inserted  into the bladder.  Continuous bladder irrigation was started and the  returns were clear.  The patient was transferred  to the PACU in a  satisfactory condition.      Dennie Maizes, M.D.  Electronically Signed     SK/MEDQ  D:  08/04/2008  T:  08/05/2008  Job:  [id number]   cc:   Dr. Lodema Hong

## 2010-11-06 NOTE — Consult Note (Signed)
[name], [name]             ACCOUNT NO.:  [id number]   MEDICAL RECORD NO.:  [id number]          PATIENT TYPE:  INP   LOCATION:  A315                          FACILITY:  [facility]   PHYSICIAN:  Osvaldo Shipper, MD     DATE OF BIRTH:  [birth year]   DATE OF CONSULTATION:  DATE OF DISCHARGE:                                 CONSULTATION   REASON FOR CONSULTATION:  Management of coronary artery disease, atrial  fibrillation and chronic obstructive pulmonary disease.   CHIEF COMPLAINT:  Blood in the urine.   HISTORY OF PRESENT ILLNESS:  The patient is an 77 year old Caucasian  male who has a history of mitral valve replacement done in 2003, a  history of coronary artery disease with a single stent placement  according to the patient, history of atrial fibrillation, history of  hypertension.  The patient was apparently doing well until this past  Sunday when he started noticing blood in his urine.  The amount of blood  increased progressively for the past two days and today he decided to  call EMS as he was feeling weak.  The patient is feeling better  currently.  He still feels a little bit weak.  Ever since the Foley  catheter was placed and the bladder was irrigated, he has less  tenderness in his abdomen.  He denies any chest pain, shortness of  breath, nausea, vomiting, abdominal pain at this time.  Otherwise he  says that he feels pretty good.   MEDICATIONS:  Medications at home according to the medication  reconciliation sheet, he is on the following:  1. Benazepril 20 mg b.i.d.  2. Flomax 0.4 mg daily.  3. Lipitor 10 mg, not on all days apparently.  4. He is on allopurinol  300 mg daily.  5. Protonix 40 mg daily.  6. Metoprolol 50 mg b.i.d.  7. Aleve on an as needed basis.  8. Aspirin 81 mg daily.  9. A multivitamin daily.   ALLERGIES:  No known drug allergies.   PAST MEDICAL HISTORY:  Positive for coronary artery disease, atrial  fibrillation, he is status post mitral  valve replacement.  He has had  issues with GI bleeding and he has multiple AVMs in his colon.  He used  to be on Coumadin in the past and because of GI bleeding he has been  taken off of the Coumadin.  He has had some kind of surgery for possible  peptic ulcer perforation many years ago.  Otherwise he also has  significant arthritis in his knees and as a result his mobility is  affected.  Otherwise denies any other surgical procedures in the past.   SOCIAL HISTORY:  His daughter lives with him in [place].  He  continues to smoke, smokes two packs of cigarettes which last him one  week.  No alcohol use.  No illicit drug use.   FAMILY HISTORY:  Noncontributory at this time.   REVIEW OF SYSTEMS:  Ten point review of systems was unremarkable.   PHYSICAL EXAMINATION:  VITAL SIGNS:  He is afebrile.  His heart rate is  in  the 90s, slightly irregular.  Respiratory rate is about 16.  Blood  pressure 130/78.  Saturation 92% on room air.  GENERAL EXAM:  This is a mildly overweight elderly male.  Pleasant to  talk to, in no distress.  HEENT:  There is mild pallor, no icterus.  Oral mucous membranes moist.  No oral lesions are noted.  NECK is soft and supple. No thyromegaly is appreciated.  LUNGS:  Clear to auscultation bilaterally, no wheezing or crackles  appreciated.  ABDOMEN is soft, nontender, nondistended.  Bowel sounds are present.  No  mass or organomegaly is appreciated.  CARDIOVASCULAR:  S1 click is heard.  S2 is normal, slightly irregular.  No murmurs appreciated at this time.  No S3 or S4.  EXTREMITIES:  Without edema.  Peripheral pulses are palpable.  Evidence  of varicosities are seen.  NEUROLOGICALLY:  The patient is alert and oriented x3.  No focal  neurological deficits are present.  He is hard of hearing and has a  hearing aid in the left ear.   LABORATORY DATA:  His hemoglobin was 9.9 today.  The previous level that  we have is from May of 2006 which was 9.8.  White  count is normal.  MCV  is 80.  Platelet count is 216.  CP INR is normal.  His CMET is  unremarkable.  UA showed large blood, protein, positive nitrites,  negative leukocytes.  Numerous RBCs were noted.  Imaging studies:  He  has had a non-contrast CAT scan of his abdomen and pelvis and the  results are still pending at this time.   ASSESSMENT:  This is an 77 year old Caucasian male who presents with  hematuria and who has a history of atrial fibrillation, mitral valve  replacement and coronary artery disease.  Medically all of his medical  issues are completely stable.  He has not had any chest pain in a long  time and he does ambulate using a cane, occasionally a walker.  Apart  from the hematuria he has been in reasonable health.   PLAN:  We agree with holding the aspirin.  We agree with continuing the  rest of his medications, holding the Aleve as well.  We agree with  Levaquin.  We will recommend checking his CBCs on a daily basis, which  Dr. Rito Ehrlich has ordered.  He will need to be transfused to make sure he  stays around 10.  I would definitely recommend transfusion if his  hemoglobin drops below 9.  If he is transfused, I would also recommend  at least 20 mg of Lasix post transfusion to prevent pulmonary edema.  His EF based on an echo done in 2006 was about 40%.   If patient needs to have cystoscopy, I think this procedure can be done  without any further cardiac testing.  He is quite stable at this time.  According to the patient he does not have a history of COPD, he is not  on any inhalers.  He continues to smoke, however.  I would recommend a  nicotine patch if he so desires.  Inhalers on an as needed basis.   We would like to thank Dr. Dennie Maizes for asking [place] to consult on  this patient.  We would be happy to follow this patient along with him  during his hospital stay.  Regarding the treatment of hematuria, we  would obviously defer to Dr. Rito Ehrlich.      Osvaldo Shipper, MD  Electronically Signed  GK/MEDQ  D:  01/28/2007  T:  01/28/2007  Job:  [id number]   cc:   Dennie Maizes, M.D.  Fax: [phone]   Milus Mallick. Lodema Hong, M.D.  Fax: [phone]

## 2010-11-06 NOTE — H&P (Signed)
NAME:  [name], [name]             ACCOUNT NO.:  [id number]   MEDICAL RECORD NO.:  [id number]          PATIENT TYPE:  OBV   LOCATION:  A306                          FACILITY:  [facility]   PHYSICIAN:  Dennie Maizes, M.D.   DATE OF BIRTH:  07-26-1921   DATE OF ADMISSION:  12/28/2007  DATE OF DISCHARGE:  LH                              HISTORY & PHYSICAL   ATTENDING PHYSICIAN:  Dennie Maizes, M.D.   CHIEF COMPLAINT:  Voiding difficulty, urinary retention, hematuria.   HISTORY OF PRESENT ILLNESS:  This 77 year old male has multiple medical  problems.  He complains of having intermittent gross hematuria since  yesterday.  He has voiding difficulty and slow urinary stream.  He is  passing blood clots in the urine.  He came to emergency room for further  evaluation.  A Foley catheter could not be inserted due to blockage.  The patient has been admitted to hospital for observation.   The patient has undergone transverse resection of prostate several years  ago.  He is admitted with similar symptoms in August of 2008.  Cystoscopy, clot evacuation and transverse resection of bladder and  prostate was done at that time.  The patient had been doing well until  recently.  He has urinary frequency and treated for nocturia times one  to two.  He had good urinary flow.  He denied having any fever, chills,  flank pain, or abdominal pain at present.   PAST MEDICAL HISTORY:  1. Coronary artery disease.  2. Chronic atrial fibrillation.  3. Hypertension.  4. Dyslipidemia.  5. Chronic obstructive pulmonary disease.  6. Gout.  7. History of coronary artery disease.  Status post coronary artery      bypass grafting and mitral valve replacement.  8. History of colonic AVMs.  9. History of peptic ulcer disease.  10.Peripheral vascular disease.  11.Chronic occlusive internal carotid artery.  12.Intrarenal abdominal aortic aneurysm.  13.Chronic obstructive pulmonary disease.  14.Anemia.  15.Status post  cataract surgery.  16.Status post appendectomy.  17.Status post hernia repair.   MEDICATIONS:  1. Centrum Silver one p.o. daily.  2. Metoprolol tartrate 15 mg one b.i.d.  3. Enablex 15 mg one p.o. daily.  4. Allopurinol 300 mg one daily.  5. Benazepril hydrochloride 20 mg one p.o. b.i.d.  6. Protonix 40 mg p.o. daily.  7. Lipitor 10 mg every Monday, Wednesday, Friday and Sunday.   ALLERGIES:  None.   PHYSICAL EXAMINATION:  HEAD, EYES, EARS, NOSE AND THROAT: Normal.  NECK:  No masses.  LUNGS:  Clear to auscultation.  HEART: Irregular rate and rhythm.  No murmurs.  ABDOMEN:  Abdomen is soft.  No palpable flank mass.  No CVA tenderness.  Suprapubic fullness was noted.  PENIS AND TESTES: Are normal.  RECTAL EXAMINATION: Large firm prostate.   ADMISSION LABS:  BUN 60. Creatinine 0.97.  Electrolytes within normal  range. CBC: WBC 11.1, hemoglobin 11.0,  hematocrit 36.3.  Urinalysis  large blood, nitrate positive, leukocyte esterase moderate; microscopic  WBCs 11- 25, RBC too numerous to count; bacteria few.   IMPRESSION:  Hematuria/retention, urinary retention, benign prostatic  hypertrophy with bladder neck obstruction, possible urinary tract  infection.   PLAN:  1. Will admit the patient to the hospital for observation.  2. Check his hemoglobin and hematocrit on a regular basis.  3. Urine culture and sensitivity. Will start the patient on p.o.      Cipro.  4. He may need cystoscopy, clot evacuation as well as fulguration of      prostate or bladder.  5. Consult cardiologist as well as hospitalist for addressing the      cardiac as well as medical problems.      Dennie Maizes, M.D.  Electronically Signed     SK/MEDQ  D:  12/29/2007  T:  12/29/2007  Job:  [id number]   cc:   Milus Mallick. Lodema Hong, M.D.  Fax: [phone]

## 2010-11-06 NOTE — Op Note (Signed)
NAMELION, FERNANDEZ             ACCOUNT NO.:  [id number]   MEDICAL RECORD NO.:  [id number]          PATIENT TYPE:  INP   LOCATION:  A315                          FACILITY:  [facility]   PHYSICIAN:  Dennie Maizes, M.D.   DATE OF BIRTH:  07-01-1921   DATE OF PROCEDURE:  01/30/2007  DATE OF DISCHARGE:                               OPERATIVE REPORT   PREOPERATIVE DIAGNOSES:  1. Hematuria.  2. Clot retention.  3. Urinary retention.  4. Elevated prostate-specific antigen.  5. Firm prostate.   POSTOPERATIVE DIAGNOSES:  1. Hematuria.  2. Clot retention.  3. Urinary retention.  4. Elevated prostate-specific antigen.  5. Firm prostate.  6. Possible carcinoma of the prostate.   OPERATIVE PROCEDURES:  1. Cystoscopy.  2. Clot evacuation.  3. Transurethral resection of the prostate.   ANESTHESIA:  Spinal.   SURGEON:  Dennie Maizes, M.D.   COMPLICATIONS:  None.   ESTIMATED BLOOD LOSS:  100 mL.   DRAINS:  A 22-French triple-lumen Foley catheter with 30-mL balloon in  the bladder.   SPECIMEN:  Prostate chips, which were sent to pathology.   COMPLICATIONS:  None.   INDICATIONS FOR PROCEDURE:  This 76 year old male was admitted to  hospital with gross hematuria and clot retention.  His PSA was elevated  to 50 and he had a firm prostate.  CT scan revealed no evidence of renal  mass, obstruction or hydronephrosis.  The patient was taken to the  operating room today for cystoscopy, clot evacuation, possible  transurethral resection of bladder tumor or prostate.   DESCRIPTION OF PROCEDURE:  Spinal anesthesia was induced and the patient  was placed on the OR table in the dorsal lithotomy position.  The  indwelling Foley catheter was removed.  Lower abdomen and genitalia were  prepped and draped in a sterile fashion.  Cystoscopy was done with a 25-  [place] scope.  The patient had evidence of previous TURP but there was  irregular nodular enlargement of prostate involving both lateral  lobes  as well as median lobe enlargement.  There was also bullous edema of the  median lobe of the prostate.  The bladder was found to contain several  large blood clots.  An [place]ik evacuator was used to remove all the blood  clots from the bladder.  The bladder mucosa was then closely examined  and found to be trabeculated.  There was no evidence of any foreign body  or tumor in the bladder.   Clinically the bleeding was from the prostate.  There was erythema of  the prostatic urethral mucosa.  The patient also an elevated PSA and a  firm prostate.  The patient had evidence of bladder neck obstruction.  I  decided to do a transurethral resection of the prostate for relief of  obstruction, bleeding, and for diagnosis.  The urethra was dilated up to  30-French with Sissy Hoff sounds.  A 28-French Iglesias resectoscope with  continuous bladder irrigation was then inserted into the bladder.  The  median lobe was resected first.  The right and left lateral lobes were  resected to create a clear  in the prostatic urethra.  The bleeding  points were cauterized and complete hemostasis was obtained.  The  estimated blood loss was about 100 mL.  There was no active bleeding at  this time.  The prostate chips were removed and sent for  histopathological examination.  The resectoscope was removed.  A 22-  French triple lumen Foley catheter with a 30-mL balloon was inserted  into the bladder.  Continuous bladder irrigation was started and the  returns were clear.  The patient was transferred to the PACU in a  satisfactory condition.      Dennie Maizes, M.D.  Electronically Signed     SK/MEDQ  D:  01/30/2007  T:  01/31/2007  Job:  [id number]   cc:   Milus Mallick. Lodema Hong, M.D.  Fax: [phone]

## 2010-11-06 NOTE — H&P (Signed)
NAMEJAXSIN, [name]             ACCOUNT NO.:  [id number]   MEDICAL RECORD NO.:  [id number]          PATIENT TYPE:  INP   LOCATION:  A315                          FACILITY:  [facility]   PHYSICIAN:  Dennie Maizes, M.D.   DATE OF BIRTH:  1922/01/06   DATE OF ADMISSION:  01/28/2007  DATE OF DISCHARGE:  LH                              HISTORY & PHYSICAL   CHIEF COMPLAINT:  Intermittent gross hematuria.   HISTORY OF PRESENT ILLNESS:  This 77 year old male has multiple medical  problems.  He has been having intermittent gross hematuria for about 4  days.  He did not have any voiding difficulty.  Has not passed any blood  clots in the urine.  There is no history of flank pain, abdominal pain,  dysuria, fever or chills.  He has undergone transurethral resection of  the prostate several years ago.  Has been voiding without much  difficulty.  He has good urinary flow, urinary frequency x3, has  reported nocturia times 1-2.  There is no past history of urolithiasis.   PAST MEDICAL HISTORY:  1. History of chronic atrial fibrillation.  2. Mitral regurgitation, status post mitral valve replacement with      porcine valve.  3. History of coronary artery disease.  4. Chronic obstructive pulmonary disease status.  5. Post TURP.  6. 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  His Coumadin has been stopped after the GI      bleeding.  He has had several endoscopies in the past.  7. Elevated cholesterol.  8. Gout.  9. Hypertension.   MEDICATIONS:  1. Benazepril hydrochloride 20 mg p.o. b.i.d.  2. Flomax 0.4 mg p.o. daily.  3. Lipitor 10 mg one p.o. daily.  4. Allopurinol 300 mg one p.o. daily.  5. Protonix 40 mg one p.o. daily.  6. Metoprolol 50 mg one p.o. b.i.d.  7. Aleve p.r.n. for pain.  8. Aspirin 81 mg p.o. daily.  9. Vitamin C one p.o. daily.   ALLERGIES:  None.   EXAMINATION:  HEAD, EYES, EARS, NOSE AND THROAT:  Normal.  NECK:  No  masses.  LUNGS:  Clear to auscultation.  HEART:  irregular rate and rhythm.  No murmurs.  ABDOMEN:  Soft.  No palpable flank mass.  Suprapubic fullness was noted.  Penis and testes are normal.  A Foley  catheter was draining heavily blood-stained urine.  RECTAL EXAMINATION:  Large, firm prostate.   The patient had no lower abdominal discomfort, and the Foley catheter  was not draining well.  The Foley catheter was adjusted and it started  draining.  Irrigation of the bladder was done and about 1000 mL of blood-  stained urine was drained.  There were no blood clots in the urine.  After the irrigation, the urine became clear.   ADMISSION LABS:  CBC:  WBC 7.9, hemoglobin 9.9. hematocrit 30.7.  BMET:  Electrolytes within normal range.  Glucose 130, BUN 13, creatinine 0.99.  Urinalysis:  Blood large, nitrite positive, leukocytes negative.  Urine  microscopic:  Rbc's too numerous to count.  PT 14.5, INR 1.1, PTT 29.  Hepatic panel normal.  Urine culture and sensitivity has been sent and  the results are pending.  The patient has been started on p.o. Levaquin.   IMPRESSION:  1. Hematuria.  2. Urinary tract infection.  3. Benign prostatic hypertrophy with bladder neck obstruction.  4. Firm prostate.  5. Anemia due to blood loss.   1. IV fluids.  2. Levaquin p.o.  Await culture and sensitivity results.  3. Bladder irrigation p.r.n. for block or clots.  4. Type and crossmatch 2 units of packed red blood cells for possible      transfusion.  5. CT scan of the abdomen and pelvis without and with contrast for      evaluation of hematuria.  6. The patient will have limited cystoscopy for further evaluation.  I      discussed with the patient and his family regarding the treatment      plans.  7. Consult hospitalist for management of his medical problems.      Dennie Maizes, M.D.  Electronically Signed     SK/MEDQ  D:  01/28/2007  T:  01/29/2007  Job:  [id number]   cc:   Milus Mallick.  Lodema Hong, M.D.  Fax: [phone]

## 2010-11-06 NOTE — Consult Note (Signed)
NAMERYEN, RHAMES             ACCOUNT NO.:  [id number]   MEDICAL RECORD NO.:  [id number]          PATIENT TYPE:  INP   LOCATION:  IC09                          FACILITY:  [facility]   PHYSICIAN:  Osvaldo Shipper, MD     DATE OF BIRTH:  [birth year]   DATE OF CONSULTATION:  08/03/2008  DATE OF DISCHARGE:                                 CONSULTATION   REASON FOR CONSULTATION:  Management of hypertension and other medical  problems.   CHIEF COMPLAINT:  Blood in the urine.   HISTORY OF PRESENT ILLNESS:  Patient is an 77 year old Caucasian male  who has urological problems which have been ongoing for many years, who  was in his usual state of health until Sunday, when he started urinating  blood with clots.  He went to Dr. Chancy Milroy office, and he was admitted  for possibly a cystoscopy and irrigation tomorrow.  Patient otherwise  denies any complaints.  No chest pain.  No shortness of breath.  He  seems to be in fairly good health otherwise.   HOME MEDICATIONS:  1. Centrum Silver daily.  2. Cipro 500 mg b.i.d.  3. Lotensin 20 mg b.i.d.  4. Zyloprim 300 mg daily.  5. Lopressor 50 mg b.i.d.  6. Protonix 40 mg daily.  7. Lipitor 10 mg on Sunday, Monday, Wednesday, and Friday.   No known drug allergies.   PAST MEDICAL HISTORY:  1. Positive for history of A fib.  Not on Coumadin because of GI      bleeding.  2. History of coronary artery disease, status post mitral valve      replacement.  3. History of GI bleeding in the past with multiple AVMs.  He has been      off of Coumadin for a few years now.  4. History of peptic ulcer disease surgery in the past.  5. History of arthritis.   SOCIAL HISTORY:  Lives with his daughter in [place].  Continues to  smoke.  No alcohol use.  No illicit drug use.   FAMILY HISTORY:  Noncontributory.   REVIEW OF SYSTEMS:  Unremarkable except as in HPI   PHYSICAL EXAMINATION:  VITAL SIGNS:  Temperature 98.1, heart rate 74,  respiratory rate 20,  blood pressure 108/58, saturation 100% on 2 liters.  GENERAL:  This is an elderly white male in no distress.  HEENT:  No pallor.  No icterus.  Oral mucous membranes are moist.  No  oral lesions are noted.  NECK:  Soft and supple.  No thyromegaly is appreciated.  LUNGS:  Clear to auscultation bilaterally.  No wheezing, rales, or  rhonchi.  CARDIOVASCULAR:  S1 and S2 is normal and regular.  No murmurs  appreciated.  No S3, S4, rubs, or bruits.  ABDOMEN:  Soft, nontender, nondistended.  Bowel sounds are present.  No  masses or organomegaly is appreciated.  EXTREMITIES:  No edema.  NEUROLOGIC:  He is alert and oriented x3.  No focal neurological  deficits are present.  He has a Foley catheter, which is draining bloody  urine.   LABS:  Hemoglobin is 8.8, which has  dropped from around 10, which is his  baseline.  Hematocrit is 28.  MCV is 71.  White count is normal.  Platelet count is 255.  Coags are normal.  Potassium is 3.2.  Bicarb is  227, glucose 114.  UA showed large blood, proteinuria, positive nitrite,  large leukocytes, innumerable RBCs.  Urine culture is pending.   He had a CT scan of his abdomen and pelvis done by Dr. Rito Ehrlich, which  did not show any renal calculi.  Stable aortic aneurysm was noted.  Possible gallstones were noted.  Blood clots were possibly seen in the  urinary bladder.   ASSESSMENT:  This is an 77 year old Caucasian male with medical problems  as stated earlier, who presents with hematuria and is scheduled to  undergo a urological procedure tomorrow.  He is anemic, most likely  because of the hematuria and is supposed to get 2 units of blood.   His blood pressure is very well controlled.  He will restart his home  medications.  He will be given Lasix IV in between the 2 units of blood  because his last EF was about 40%.   At this time, patient is medically stable.  Urological issues per Dr.  Rito Ehrlich.   We would like to thank Dr. Rito Ehrlich for asking [place]  to consult on this  patient for his medical problems.  We will follow this patient along on  a daily basis.      Osvaldo Shipper, MD  Electronically Signed     GK/MEDQ  D:  08/03/2008  T:  08/04/2008  Job:  [id number]   cc:   Dennie Maizes, M.D.  Fax: [phone]   Milus Mallick. Lodema Hong, M.D.  Fax: [phone]

## 2010-11-06 NOTE — Consult Note (Signed)
NAME:  [name], [name]             ACCOUNT NO.:  [id number]   MEDICAL RECORD NO.:  [id number]          PATIENT TYPE:  OBV   LOCATION:  A306                          FACILITY:  [facility]   PHYSICIAN:  Osvaldo Shipper, MD     DATE OF BIRTH:  09/11/1921   DATE OF CONSULTATION:  12/29/2007  DATE OF DISCHARGE:                                 CONSULTATION   Reason for Consultation: Management of Hypertension, CAD and A-fib   Physician Requesting Consultation: Dr. Chancy Milroy   CHIEF COMPLAINT:  Hematuria.   HISTORY OF PRESENT ILLNESS:  It should be noted that the patient  unfortunately has two medical records in the system. His other medical  record number is [id number].  The patient presented yesterday with  complaints of blood in the urine. The patient was admitted to the  Urology Service and the plans are for cystoscopy in the morning with  irrigation of the bladder and clot evacuation. We were asked to see the  patient for medical management. The patient is denying any pain at this  time. He was having significant suprapubic pain yesterday, but currently  is pain-free. He denies any other complaints. No chest pain, no  shortness of breath. He tells me that he is not on aspirin or Coumadin  at this time. Coumadin was discontinued many years ago for GI bleeding  and he has been off of his aspirin as well, which was taken off by his  PMD. The patient has had numerous issues with hematuria and GI bleedings  in the past.   So, he is fairly comfortable, no complaints offered other than his  urological issues at this time.   MEDICATIONS AT HOME:  He is on the following according to the medical  reconciliation sheet: The patient does not recall his medications.  1. Centrum Silver one daily.  2. Metoprolol 50 mg b.i.d.  3. Enablex 15 mg daily.  4. Allopurinol 300 mg daily.  5. Benazepril 20 mg b.i.d.  6. Protonix 40 mg daily.  7. Lipitor 10 mg four times a week.   ALLERGIES:  Does not report  any drug allergies.   PAST MEDICAL HISTORY:  1. Coronary artery disease status post stenting.  2. Mitral valve replacement with a porcine valve in 2003.  3. Chronic atrial fibrillation and has been on Coumadin in the past,      but not currently.  4. He has had surgery for peptic ulcer perforation in the past.  5. He has significant arthritis in his knees.  6. Dyslipidemia.  7. Hypertension.  8. He does have a history of abdominal aortic aneurysm.  9. Chronic obstructive pulmonary disease.  10.Anemia.  11.He is status post appendectomy.  12.Hernia repair.  13.Cataract surgery as well.   SOCIAL HISTORY:  He lives with his daughter in [place]. He is a  tobacco abuser. No alcohol use. No illicit drug use.   FAMILY HISTORY:  Noncontributory.   REVIEW OF SYSTEMS:  GENERAL:  Positive for weakness, malaise. HEENT:  Unremarkable. CARDIOVASCULAR: Unremarkable. RESPIRATORY:  Unremarkable.  GI:  Unremarkable. GU:  As in HPI. Other  systems are unremarkable.   PHYSICAL EXAMINATION:  He is afebrile with heart rate in the 70s,  appears to be regular. Respiratory rate is 20. Saturation 92% on room  air. Blood pressure 130/79.  GENERAL:  This is an elderly white male in no distress.  HEENT: There is mild pallor and no icterus. Oral mucus membranes moist.  No oral lesions are noted.  NECK: Soft and supple. No thyromegaly is appreciated.  LUNGS:  Clear to auscultation bilaterally anteriorly.  CARDIOVASCULAR: S1, S2 normal, regular. No murmurs appreciated.  EXTREMITIES: Show no edema. Peripheral pulses are palpable.  NEUROLOGICAL:  He is alert, oriented x3.  GU:  He does have hematuria. He does not appear to have any lesions that  are obvious on his penis. Full urological GU examination was deferred to  the urologist.   LABORATORY DATA:  His white count is 11,100, hemoglobin 11.0, platelet  count 245, BMET is unremarkable. UA did show large blood, evidence for  infection was also noted. He  had a CT scan of his abdomen/pelvis which  revealed evidence for renal cyst 4.3 x 3.8 cm, AAA, cholelithiasis was  also noted and then of course filling defects in the urinary bladder  likely representing blood was noted. No definite mass was identified in  his bladder.   ASSESSMENT/PLAN:  This is an 77 year old Caucasian male who has coronary  artery disease, mild cardiomyopathy with an EF of 40%, hypertension who  presents with hematuria. Plan is for the patient to undergo cystoscopy  and bladder irrigation tomorrow. The patient is quite stable at this  time. Does not require any further evaluation prior to undergoing this  procedure. His medical issues including hypertension, coronary artery  disease appear to be quite stable. His EF is low, so I will be careful  with his IV hydration. I will also go ahead and check another H&H today  as his lab work is from yesterday. If he requires blood transfusions for  dropping hemoglobin I will recommend Lasix after transfusion to avoid  pulmonary edema. Deep vein thrombosis prophylaxis with sequential  compressive devices will be recommended as well. He is appropriately on  Cipro and gentamicin for possible urinary tract infection. Rest of his  home medications are being continued as they should be.  Renal  ultrasound to evaluate the renal cysts will be obtained.   Hopefully, this patient will have an uneventful hospital course, but  will be available to follow this patient closely and tackle any needs  that may arise. Would like to thank Dr. Rito Ehrlich for asking [place] to  consult on this patient.      Osvaldo Shipper, MD  Electronically Signed     GK/MEDQ  D:  12/29/2007  T:  12/29/2007  Job:  [id number]   cc:   Milus Mallick. Lodema Hong, M.D.  Fax: [phone]   [name]. Dietrich Pates, MD, [facility]  [address]  [place], [place] [address]

## 2010-11-06 NOTE — Discharge Summary (Signed)
NAME:  [name], [name]             ACCOUNT NO.:  [id number]   MEDICAL RECORD NO.:  [id number]          PATIENT TYPE:  INP   LOCATION:  A328                          FACILITY:  [facility]   PHYSICIAN:  Dennie Maizes, M.D.   DATE OF BIRTH:  31-Aug-1921   DATE OF ADMISSION:  08/03/2008  DATE OF DISCHARGE:  02/13/2010LH                               DISCHARGE SUMMARY   CONSULTING PHYSICIAN:  The hospitalist.   FINAL DIAGNOSES:  Hematuria with clot retention, urinary retention due  to benign prostatic hypertrophy, mucinous adenocarcinoma of prostate,  urinary tract infection, anemia due to blood loss.   OTHER DIAGNOSIS:  Coronary artery disease, chronic atrial fibrillation,  hypertension, dyslipidemia, chronic obstructive pulmonary disease, gout,  peptic ulcer disease, peripheral vascular disease, carotid vascular  occlusive disease.   OPERATIVE PROCEDURE:  Cystoscopy, evacuation of blood clots, and  transverse resection of prostate done on August 04, 2008.   COMPLICATIONS:  None.   DISCHARGE SUMMARY:  This 77 year old male has multiple medical problems.  He has a history of benign prostatic hypertrophy with bladder neck  obstruction.  He had been taking Flomax with good results.  He has a  history of hematuria and clot retention for which he has been treated in  August 2009.  Cystoscopy, evacuation of blood clots, and fulguration of  prostate were done at that time.   The patient started having hematuria about a week ago, went to the  emergency room x2 for further evaluation.  Foley catheter was inserted  and several blood clots were evacuated from the bladder.  The patient  returned to the emergency room with the blocked catheter and he had  bladder irrigation.  He had been in the office x2 with similar problems.  Bladder irrigation and removal of blood clots were done in the office.  His urinalysis revealed urinary tract infection and he has been started  on Cipro.   The patient  is now having recurrent hematuria with lower abdominal pain,  and weakness today.  He went back to emergency room.  He was admitted  through the emergency room for further evaluation and management.   PAST MEDICAL HISTORY:  Coronary artery disease, chronic atrial  fibrillation, hypertension, dyslipidemia, chronic obstructive pulmonary  disease, gout, status post coronary artery bypass grafting and mitral  valve replacement with Porcine valve, colonic arteriovenous  malformation, peptic ulcer disease, peripheral vascular disease, chronic  occlusive internal carotid artery disease into infrarenal abdominal  aortic aneurysm, chronic anemia, status post cataract surgery, status  post appendectomy, status post hernia repair.   MEDICATION:  1. Allopurinol 100 mg p.o. daily.  2. Protonix 40 mg p.o. daily.  3. Metoprolol 50 mg twice a day.  4. Metoprolol and hydrochlorothiazide 20/12.5 twice a day.  5. Cipro 500 mg p.o. b.i.d.  6. Multivitamins 1 p.o. daily.   ALLERGIES:  None.   PHYSICAL EXAMINATION:  HEAD, EYES, EARS, NOSE, AND THROAT:  Normal.  NECK:  No masses.  LUNGS:  Clear to auscultation.  HEART:  Regular rate and rhythm.  No murmurs.  ABDOMEN:  Soft.  No palpable flank mass or CVA  tenderness.  Suprapubic  tenderness was noted.  Penis and testes are normal.  RECTUM:  Large, firm prostate.  Foley catheter was draining heavily  blood-stained urine and blood clots.   ADMISSION LABORATORY DATA:  CBC; WBC 7.4, hemoglobin 8.8, hematocrit  38.5, platelet count 235 thousand.  Urinalysis; blood large, nitrate  positive, leukocyte esterase large, wbc's 0-2 per high-powered field,  rbc's too numerous to count, PT 15.8, PTT 33.  BMET; sodium 138,  potassium 3.2, chloride 105, CO2 27, glucose 114, BUN 9, and creatinine  0.9.   COURSE IN THE HOSPITAL:  The patient was admitted to the hospital.  He  received IV fluids and potassium supplements plus he was started on  parenteral antibiotics.   He received 2 units of packed red blood cells  to correct his anemia.  Hospitalist was consulted for management of  medical problems.  After correction of anemia, the patient was taken to  the operating room on August 04, 2008.  Cystoscopy, evacuation of  blood clots, and transurethral resection of prostate was done under  spinal anesthesia.  There were no intraoperative problems.  Estimated  blood loss was 100 mL.  The patient did well in the postoperative  period.  Postoperative labs; WBC 7.9, hemoglobin 9.8, hematocrit 29.9,  BUN 7, creatinine 0.9, and the potassium 3.8.  The urine became clear  and the continuous bladder irrigation was discontinued on the first  postoperative day.  On the second postoperative day, the Foley catheter  was removed and the patient was able to void well after this.  He was  discharged and sent home on August 06, 2008.  He was advised to  continue regular medications.  He was also given Cipro and Percocet.  The pathology of the prostate revealed prostate adenocarcinoma with the  Gleason score of 7, 3+4.  The condition of the patient at the time of  discharge was stable.  He will be reviewed in the office in 2 weeks for  followup.      Dennie Maizes, M.D.  Electronically Signed     SK/MEDQ  D:  08/30/2008  T:  08/31/2008  Job:  [id number]   cc:   Milus Mallick. Lodema Hong, M.D.  Fax: [phone]

## 2010-11-06 NOTE — H&P (Signed)
[name], [name]             ACCOUNT NO.:  [id number]  MEDICAL RECORD NO.:  [id number]           PATIENT TYPE:  I  LOCATION:  IC03                          FACILITY:  [facility]  PHYSICIAN:  Houston Siren, MD           DATE OF BIRTH:  May 27, 1922  DATE OF ADMISSION:  10/23/2010 DATE OF DISCHARGE:  LH                             HISTORY & PHYSICAL   PRIMARY CARE PHYSICIAN:  Milus Mallick. Lodema Hong, MD, of [facility].  UROLOGIST:  Dennie Maizes, MD  ADVANCE DIRECTIVE:  Full code, this was confirmed tonight.  REASON FOR ADMISSION:  Shortness of breath.  HISTORY OF PRESENT ILLNESS:  This is an 77 year old male with history of coronary artery disease, chronic atrial fibrillation status post porcine mitral valve replacement, mucinous prostate cancer, history of gout, COPD on home O2, who lives with his daughter, presents to the emergency room complaining of 4-day history of productive cough, increased shortness of breath, and increased dyspnea on exertion.  He also had decrease in his appetite and has not been able to take adequate amount of fluid according to his daughter.  He denied any chest pain. Apparently, he saw his primary care physician who gave him penicillin which he has taken.  Evaluation in the emergency room with ABG 7.23, 65, pAO2 147.  His EKG showed atrial fibrillation with rapid ventricular rate of 108 and a right bundle-branch block.  Chest x-ray shows stable cardiomegaly but no infiltrate.  He also has an elevated white count of 15,000, hemoglobin of 16.9, troponin less than 0.05.  He has elevated BUN and creatinine of 82 and 2.5.  Hospitalist was asked to admit this patient for COPD exacerbation and respiratory failure.  It should be noted that although his ABG was horrendous, he is clinically  looking much better than his blood gas would indicate.  PAST MEDICAL HISTORY: 1. COPD. 2. Hypertension. 3. Gastric ulcer. 4. GERD. 5. Gout. 6. Hyperlipidemia. 7.  Prostate cancer. 8. On home O2.  PAST SURGICAL HISTORY: 1. Status post appendectomy. 2. Heart bypass. 3. Porcine mitral valve replacement. 4. Hiatal hernia.  SOCIAL HISTORY:  He is a smoker but denies any alcohol or drug use.  ALLERGIES:  No known drug allergies.  CURRENT MEDICATIONS: 1. Tramadol 50 mg b.i.d. 2. Lovastatin 40 mg at bedtime. 3. Prilosec 40 mg per day. 4. Penicillin. 5. Omeprazole. 6. Lopressor 50 mg b.i.d.  PHYSICAL EXAMINATION:  Blood pressure 120/60, pulse of 100, respiratory rate of 24, temperature 98.8.  Exam shows that he is alert and oriented and conversing.  He does appear short of breath.  Sclerae are nonicteric.  Throat is clear.  Tongue is midline.  No stridor.  No JVD. Cardiac exam revealed S1 and S2, irregularly irregular.  No gallop. Lungs with inspiratory and expiratory wheezes but no crackle.  Abdominal exam is obese, nondistended, nontender.  Extremities show no edema.  No calf tenderness.  Skin is warm and dry.  Neurological exam is nonfocal. Psychiatric exam is unremarkable as well.  OBJECTIVE FINDINGS:  Serum sodium 138, potassium 3.9, CO2 26, blood glucose of 139, creatinine 2.5, BUN  of 85.  EKG showed atrial fibrillation with rapid ventricular rate.  White count of 15,600, hemoglobin of 16.9, platelet count was clumped but it appeared adequate according to the report.  ABG 7.23, 66, pAO2 145 on 65%, troponin less than 0.05.  Chest x-ray shows stable cardiomegaly, no acute finding.  IMPRESSION:  This is an 77 year old male with history of chronic obstructive pulmonary disease on home oxygen along with chronic atrial fibrillation, history of prostate cancer, mitral valve replacement, presented with chronic obstructive pulmonary disease exacerbation and respiratory failure.  He is tolerating bilevel positive airway pressure and I suspect that his ABG will improve.  Even without a bilevel positive airway pressure, he looks clinically better  than his ABG indicates.  We will admit him to the intensive care unit.  I would like to give him Rocephin and Zithromax for chronic obstructive pulmonary disease exacerbation.  We will give him frequent nebulizer treatments. He is rather volume depleted, evidenced by elevated BUN and creatinine and will be given some intravenous fluid.  I will hold off on steroid. He is not having any significant wheezing.  We will continue his other medications, but I will stop his allopurinol and benazepril because of the elevated creatinine.  He is a full code and will be admitted to [facility].     Houston Siren, MD     PL/MEDQ  D:  10/23/2010  T:  10/23/2010  Job:  [id number]  Electronically Signed by Houston Siren  on 11/06/2010 04:17:58 AM

## 2010-11-06 NOTE — H&P (Signed)
[name], [name]             ACCOUNT NO.:  [id number]   MEDICAL RECORD NO.:  [id number]          PATIENT TYPE:  EMS   LOCATION:  ED                            FACILITY:  [facility]   PHYSICIAN:  Dennie Maizes, M.D.   DATE OF BIRTH:  [birth year]   DATE OF ADMISSION:  08/03/2008  DATE OF DISCHARGE:  LH                              HISTORY & PHYSICAL   CHIEF COMPLAINT:  Hematuria, clot retention, lower abdominal pain,  weakness.   HISTORY OF PRESENT ILLNESS:  This 77 year old male has multiple medical  problems.  He has previous bladder neck obstruction.  He has been taking  Flomax with good results.  He has a history of hematuria and clot  retention August 2009, cystoscopy and evacuation of blood clots and  fulguration of the protate at that time.   The patient started having gross hematuria about a week ago.  He went to  the emergency room x2 for further evaluation.  Foley catheter inserted  and several blood clots were evacuated from the bladder.  The patient  returned to the emergency room with a blocked catheter and had bladder  irrigation.  He has been seen in the office x2 with similar problems.  Bladder irrigation and removal of blood clots were done in the office.  His urinalysis suggested urinary tract infection and he has been started  on Cipro.   The patient started having hematuria with lower abdominal pain and  weakness today.  He went back to the emergency room.  I saw him in the  emergency room for further evaluation.   PAST MEDICAL HISTORY:  1. Coronary artery disease.  2. Chronic atrial fibrillation.  3. Hypertension.  4. Dyslipidemia.  5. Chronic obstructive pulmonary disease.  6. Gout.  7. Status post coronary artery bypass grafting and mitral valve      replacement (porcine valve.)  8. Colonic arteriovenous malformation.  9. Peptic ulcer disease.  10.Peripheral vascular disease.  11.Chronic occlusive internal carotid artery disease.  12.Intrarenal  abdominal aortic aneurysm.  13.Chronic anemia.  14.Status post cataract surgery.  15.Status post appendectomy.  16.Status post hernia repair.   MEDICATIONS:  1. Allopurinol 110 mg p.o. daily.  2. Protonix 40 mg p.o. daily.  3. Metoprolol 50 mg  twice a day.  4. Metoprolol/HCT 20/12.5 twice a day.  5. Cipro 5 mg p.o. b.i.d.  6. Multivitamins 1 p.o. daily.   ALLERGIES:  NONE.   PHYSICAL EXAMINATION:  HEAD, EYES, EARS, NOSE, THROAT:  Normal.  NECK:  No masses.  LUNGS:  Clear to auscultation.  HEART:  Regular rate and rhythm.  No murmurs.  ABDOMEN:  Soft.  No palpable flank mass or CVA tenderness.  Suprapubic  tenderness was noted.  Penis and testicles are normal.  RECTAL:  Large firm prostate.  Foley catheter was draining heavily blood  stained urine with blood clots.   LABORATORY DATA:  CBC: WBC 7.4, hemoglobin 8.8, hematocrit 38.5,  platelet count 255.  Urinalysis:  Blood large, nitrate positive,  leukocyte esterase large, WBCs 0-2 per high-power field, RBCs too  numerous to count, PT 15.8, PTT 33.  BMET:  Sodium 138, potassium 3.2,  chloride 105, CO2 27, glucose 114, BUN 9, creatinine 0.9.   IMPRESSION:  Hematuria, clot retention, urinary retention, benign  prostatic hypertrophy with bladder neck obstruction, urinary tract  infection, anemia.   PLAN:  Admit the patient to the hospital for IV fluids with potassium  supplements, parenteral antibiotics.  Transfusion of 2 units of packed  red blood cells to correct anemia. Hospitalist consult for medical  problems.  Cystoscopy, possible transverse resection of prostate with  bladder tumor under anesthesia in a.m.   I have discussed with the patient and his family regarding the  diagnosis, operative details, alternative treatments, outcome, possible  risks and complications and they have agreed for the procedure to be  done.      Dennie Maizes, M.D.  Electronically Signed     SK/MEDQ  D:  08/03/2008  T:  08/03/2008   Job:  [id number]   cc:   Lodema Hong, MD

## 2010-11-06 NOTE — Procedures (Signed)
[name], [name] NO.:  [id number]   MEDICAL RECORD NO.:  [id number]          PATIENT TYPE:  INP   LOCATION:  A315                          FACILITY:  [facility]   PHYSICIAN:  Pricilla Riffle, MD, FACCDATE OF BIRTH:  Nov 17, 1921   DATE OF PROCEDURE:  01/30/2007  DATE OF DISCHARGE:                                ECHOCARDIOGRAM   REFERRING PHYSICIAN:  Dennie Maizes, M.D.   INDICATIONS:  An 77 year old with history of CAD, CHF, mitral valve  replacement, also atrial fibrillation, test to evaluate LV function.   A 2-D echocardiogram with echocardiogram Doppler very difficult because  of difficult acoustic windows.   Left ventricle is normal in size with end-diastolic dimension of 42 mm.  The interventricular septum is severely thickened at 24 mm.  Posterior  wall mildly thickened and 15 mm.   The left atrium is severely dilated at 60 mm.  Right atrium is grossly  moderately dilated. Right ventricle is normal.  Aortic root normal at 39  mm.   The aortic valve is thickened, calcified with mildly restricted motion.  Mean gradient through the valve is 11 mmHg.  There is trivial aortic  insufficiency.   Mitral valve is a bioprosthesis.  Straps visible.  Mean gradient through  the valve is 11 mmHg with valve area by pressure half-time of 1.8 cm2.  Tricuspid valve is normal with trace insufficiency.  Pulmonic valve is  not well seen.  No insufficiency.   Overall LV systolic function appears to be mildly depressed  at 40%.  There is some inferobasal hypokinesis but difficult to evaluate other  regional wall motion, again as endocardium is difficult to see.   RV systolic function is normal.   No pericardial effusion is seen.  IVC is normal sinus.   In comparison to report from January 15, 2005, no significant change.      Pricilla Riffle, MD, [facility]  Electronically Signed     PVR/MEDQ  D:  01/30/2007  T:  01/30/2007  Job:  [id number]   cc:   Dennie Maizes, M.D.  Fax:  [phone]

## 2010-11-06 NOTE — Discharge Summary (Signed)
[name], [name]             ACCOUNT NO.:  [id number]   MEDICAL RECORD NO.:  [id number]          PATIENT TYPE:  INP   LOCATION:  A227                          FACILITY:  [facility]   PHYSICIAN:  Dennie Maizes, M.D.   DATE OF BIRTH:  [birth year]   DATE OF ADMISSION:  01/28/2007  DATE OF DISCHARGE:  08/11/2008LH                               DISCHARGE SUMMARY   CONSULTING PHYSICIAN:  Hospitalist and cardiologist.   FINAL DIAGNOSIS:  1. Urinary retention.  2. Clot retention.  3. Hematuria.  4. Benign prostatic hypertrophy with bladder neck obstruction.  5. Elevated PSA.  6. Anemia due to blood loss.   OTHER DIAGNOSES:  1. Coronary artery disease.  2. Chronic atrial fibrillation.  3. Hypertension.  4. Peripheral vascular disease.  5. Dyslipidemia.  6. Chronic obstructive pulmonary disease.  7. Gout.   OPERATIVE PROCEDURES:  1. Cystoscopy.  2. Clot evacuation.  3. Transurethral resection of the prostate done on January 30, 2007.   COMPLICATIONS:  None.   DISCHARGE SUMMARY:  This 77 year old male has multiple medical problems.  He was having intermittent gross hematuria for about 4 days.  He denied  having any voiding difficulty.  He was not passing any blood clots in  the urine.  There was no history of flank pain, abdominal pain, dysuria,  fever, or chills.  He had undergone transurethral resection of the  prostate several years ago.  He was voiding without much difficulty.  He  had good urinary flow, urinary frequency x3, and nocturia x1-2.  There  was no past history of urolithiasis.  The patient was admitted to  hospital for hematuria.   PAST MEDICAL HISTORY:  History of chronic atrial fibrillation, mitral  regurgitation, status post mitral valve replacement with both porcine  valve, history of coronary artery disease, chronic obstructive pulmonary  disease, status post TURP, history of GI bleeding due AV malformation.  His Coumadin has been stopped after the GI  bleeding.  He has had several  endoscopies in the past history.  History of elevated cholesterol, gout,  and hypertension.   MEDICATIONS:  1. Benazepril/hydrochloride 20 one p.o. b.i.d.  2. Flomax 0.4 mg one p.o. daily.  3. Lipitor 10 mg one p.o. daily.  4. Allopurinol 10 mg p.o. daily.  5. Protonix 40 mg one p.o. daily.  6. Metoprolol 50 mg one p.o. b.i.d.  7. Aleve p.r.n. for pain.  8. Aspirin 81 mg one p.o. daily.  9. Vitamin C one p.o. daily.   ALLERGIES:  None.   PHYSICAL EXAMINATION:  Head, Eyes, Ears, Nose and Throat: Normal.  Neck:  No masses.  Lungs: Clear to auscultation.  Heart irregular rate and  rhythm, no murmurs.  Abdomen:  Soft, no palpable flank mass.  No  costovertebral angle tenderness.  Suprapubic tenderness was noted.  Penis and testes are normal.  Foley catheter was inserted and drained  heavily blood-stained urine.  Rectal examination a large, firm prostate.   ADMISSION LABS:  CBC/WBC 7.9, hemoglobin 9.9, hematocrit 36.7.  Electrolytes within normal range.  BUN 13, creatinine 0.9.  Urinalysis  blood large, nitrate positive, leukocytes  negative.  Urine microscopic  rbc's too numerous to count.  PT 14.5, INR 1.1, PTT is 29.  Hepatic  panel normal.  Urine culture and sensitivity was done; and the patient  was started on p.o. Levaquin.   HOSPITAL COURSE:  The patient was continued on catheter drainage and  bladder irrigations p.r.n..  CT scan of the abdomen and pelvis with and  without contrast was done for further evaluation.  This revealed  bilateral renal cyst.  There was no evidence of a chronic renal mass  calculus.  Blood clots were noted in the bladder.   The patient was found to be anemic with hemoglobin of 9.4, hematocrit of  29.3 on January 29, 2007.  He was transfused 2 units of packed red blood  cells.  I counseled the patient and his family regarding further  management.  He was taken to the OR on January 30, 2007.  Cystoscopy, clot  evacuation,  and transurethral resection of the prostate was done.  There  was an abnormality in the bladder that began stemming from the prostate.  The patient was seen by Dr. Osvaldo Shipper, hospitalist, and the  cardiologist for management of his medical as well as cardiac problems;  please refer to the consultation reports for details.   The first postoperative day the patient had clearing of the hematuria.  The bladder irrigation was discontinued.  The second postoperative day  the patient was found to be doing well and the Foley catheter was  removed.  The patient was emptying the bladder well.  On February 02, 2007  the patient was found to be voiding well; and there was no hematuria.  He was discharged and sent home.  The patient's PSA was elevated to  51.68 nanograms/mL.  The final pathology of the prostate however, did  not reveal any malignancy; only benign prostate hyperplasia was noted.   The condition of the patient at time of discharge is stable.   MEDICATIONS:  He was advised to continue his regular medications.  He  was also given Cipro 500 mg one p.o. b.i.d. for 5 days, and Percocet  5/325 one p.o. p.r.n. pain #20.   He will be reviewed in the office in 2 weeks; at which time I plan to  discuss with him regarding the pathology and elevated PSA.      Dennie Maizes, M.D.  Electronically Signed     SK/MEDQ  D:  03/06/2007  T:  03/06/2007  Job:  [id number]   cc:   Milus Mallick. Lodema Hong, M.D.  Fax: [phone]

## 2010-11-06 NOTE — Group Therapy Note (Signed)
NAME:  [name], [name]             ACCOUNT NO.:  [id number]  MEDICAL RECORD NO.:  [id number]           PATIENT TYPE:  I  LOCATION:  IC03                          FACILITY:  [facility]  PHYSICIAN:  Wilson Singer, M.D.DATE OF BIRTH:  Jun 29, 1921  DATE OF PROCEDURE:  10/30/2010 DATE OF DISCHARGE:                                PROGRESS NOTE   This 77 year old man had presented to the hospital approximately 1 week ago now with dyspnea.  He was found to be in respiratory failure and the etiology at that time was not unclear.  It was questionable whether he had COPD and pneumonia versus heart failure.  Overall, the feeling was that he most likely had pneumonia and septic shock from this.  During the next few days, he tolerated BiPAP on some days and not on others and became increasingly confused.  On Oct 27, 2010, the patient desaturated after not tolerating BiPAP and was intubated and mechanically ventilated.  He has been on ventilator since this time.  Today, he continues to be sedated on the ventilator and seems to be doing reasonably well and is stable.  PHYSICAL EXAMINATION:  VITAL SIGNS:  Temperature 98.5, blood pressure 163/102, pulse 91, saturation 94% on FIO2 of 50%. CARDIOVASCULAR:  Heart sounds are present and normal. LUNGS:  Lung fields are clear anteriorly without any real wheezing. ABDOMEN:  Soft and nontender. NEUROLOGIC:  He is sedated and further neurological examination is really unfruitful.  Investigations today show sodium of 144, potassium 3.8, bicarbonate 33, BUN 33, creatinine 0.89.  Arterial blood gas shows a pH of 7.5, pCO2 of 39.6, pO2 of 69.8, and saturation 94.7%.  His albumin has been 3.0, recently measured.  Chest x-ray done yesterday shows bilateral infiltrates and improved aeration in the right lower lung.  IMPRESSION: 1. Ventilator-dependent respiratory failure due to bilateral     pneumonia. 2. Possible element of congestive heart failure. 3. Chronic  obstructive pulmonary disease. 4. History of prostate cancer. 5. Chronic atrial fibrillation. 6. Hypertension.  PLAN: 1. Plan is to continue on ventilation and try to wean off the     ventilator per Pulmonology. 2. If he does not tolerate weaning, then he will now need a feeding by     a nasogastric tube and the recommendations are for him to have     osmolyte to start at 35 mL an hour and advance 10 mL every 12 hours     to a goal of 65 mL an hour. 3. I still do not have the report of the echocardiogram despite it     being done 6 days ago!  We will try and chase this up.  We will ask     the Cardiology team to get involved again.  The patient continues     in this mode and I have     spoken on several occasions with the patient's daughter about end-     of-life care if the patient does not do well.  She understands that     if he does not do well on the ventilator, then decision may well  have to be made to extubate him for end-of-life care and comfort     measures only.     Wilson Singer, M.D.     NCG/MEDQ  D:  10/30/2010  T:  10/30/2010  Job:  [id number]  Electronically Signed by Lilly Cove M.D. on 11/06/2010 08:17:48 AM

## 2010-11-06 NOTE — Op Note (Signed)
NAMEKYVON, HU             ACCOUNT NO.:  [id number]   MEDICAL RECORD NO.:  [id number]          PATIENT TYPE:  OBV   LOCATION:  A306                          FACILITY:  [facility]   PHYSICIAN:  Dennie Maizes, M.D.   DATE OF BIRTH:  1922-04-08   DATE OF PROCEDURE:  12/30/2007  DATE OF DISCHARGE:                               OPERATIVE REPORT   PREOPERATIVE DIAGNOSES:  1. Hematuria.  2. Clot retention.  3. Urinary retention.  4. Benign prostatic hypertrophy.   POSTOPERATIVE DIAGNOSES:  1. Hematuria.  2. Clot retention.  3. Urinary retention.  4. Benign prostatic hypertrophy.  5. Prostate bleeding.   OPERATIVE PROCEDURE:  Cystoscopy, evacuation of blood clot, and  fulguration of prostate.   ANESTHESIA:  Spinal.   SURGEON:  Dennie Maizes, MD   COMPLICATIONS:  None.   ESTIMATED BLOOD LOSS:  Minimal to this procedure.   SPECIMEN:  None.   DRAINS:  A 22-French triple-lumen Foley catheter with 30 mL balloon in  the bladder.   INDICATION FOR THE PROCEDURE:  This 77 year old male was admitted to the  hospital for hematuria and clot retention.  CT scan of the abdomen and  pelvis revealed no evidence of any renal mass, obstruction, or  hydronephrosis.  Multiple blood clots were noted in the bladder.  The  patient was brought to the operating room today for cystoscopy,  evacuation of blood clot, and fulguration of the prostate and bladder.   DESCRIPTION OF PROCEDURE:  Spinal anesthesia was induced and the patient  was placed on the OR table in the dorsal lithotomy position.  The lower  abdomen and genitalia were prepped and draped in a sterile fashion.  Cystoscopy was done to 25-French scope.  There was evidence of previous  TURP and there was bullous edema of the bladder neck area and prostate.  There was an enlarged blood vessels over the prostate with mild  bleeding.  Large large amount of blood clots in the bladder.  An Ellik  evacuator was used to evacuate the blood  clots from the bladder.  The  volume of the blood clots was about 300 mL.  The bladder was then  examined carefully and there was no evidence of any bladder tumor or  foreign body.  There was mild bleeding from the prostatic bed.   I tried to insert a 28-French resectoscope, but this was difficult due  to the high-riding bladder neck.  The cystoscope was then reinserted.  With green valve electrode, prostate bleeding was cauterized and  controlled.  There was no active bleeding at this time.  The cystoscope  was removed.  A 22-French triple-lumen Foley catheter with 30 mL balloon  was inserted into the bladder and continuous bladder irrigation was  started.  The returns were clear.   ESTIMATED BLOOD LOSS:  For this procedure was minimal.  The patient was  transferred to the PACU in a satisfactory condition.      Dennie Maizes, M.D.  Electronically Signed     SK/MEDQ  D:  12/30/2007  T:  12/31/2007  Job:  [id number]   cc:  Norwood Levo. Moshe Cipro, M.D.  Fax: [phone]

## 2010-11-06 NOTE — Letter (Signed)
March 17, 2007    Leonard [name] [name]. Leonard [name] [name], M.D.  [address]  [place], [place] [address]   RE:  Leonard [name], [name]  MRN:  [id number]  /  DOB:  October 02, 1921   Dear Leonard [name] [name]:   Mr. [name] returns to the office after a long hiatus. He was recently  hospitalized with GU bleeding and required TURP. He has done quite well  since that procedure. He has chronic atrial fibrillation, but cannot be  maintained on Warfarin due to recurrent GI bleeding despite many  attempts to deal with anticoagulation approximately 3-5 years ago. He  was taken off of aspirin at the time of his TURP. He had very low  cholesterol in the hospital. Renal function remains normal. He had mild  anemia after his operation.   He denies all cardiopulmonary symptoms. He is quite limited due to  chronic knee problems, but he has refused bilateral total knee  arthroplasties.   CURRENT MEDICATIONS:  1. Atorvastatin 10 mg 4 days per week.  2. Protonix 40 mg daily.  3. Metoprolol 50 mg b.i.d.  4. Benazepril 20 mg b.i.d.  5. Allopurinol 300 mg daily.  6. Flomax 0.4 mg daily.  7. Multivitamin.   PHYSICAL EXAMINATION:  GENERAL:  Very pleasant gentleman in no acute  distress.  VITAL SIGNS:  The weight is 218 pounds, 4 pounds more than 2 years ago.  Blood pressure 100/70, heart rate 85 and irregular, respirations 16.  NECK:  No jugular venous distension; normal carotid upstrokes without  bruits.  LUNGS:  Clear.  CARDIAC:  Normal first and second heart sounds; grade 2/6 basilar  systolic ejection murmur.  ABDOMEN:  Soft and nontender; no organomegaly.  EXTREMITIES:  1/2+ ankle edema, distal pulses intact.   IMPRESSION:  Mr. Leonard [name] [name] is doing beautifully. He will resume aspirin 81  mg daily. I will see him again in one year.    Sincerely,      [name]. Dietrich Pates, MD, [facility]  Electronically Signed    RMR/MedQ  DD: 03/17/2007  DT: 03/18/2007  Job #: [id number]

## 2010-11-09 NOTE — Op Note (Signed)
NAMEJONLUKE, COBBINS             ACCOUNT NO.:  [id number]   MEDICAL RECORD NO.:  [id number]          PATIENT TYPE:  INP   LOCATION:  IC10                          FACILITY:  [facility]   PHYSICIAN:  Lionel December, M.D.    DATE OF BIRTH:  [birth year]   DATE OF PROCEDURE:  09/03/2004  DATE OF DISCHARGE:                                 OPERATIVE REPORT   PROCEDURE:  Esophagogastroduodenoscopy, followed by total colonoscopy.   INDICATIONS:  Mr. Stitely is an 77 year old Caucasian male who presented  three days ago with GI bleed, anemia and hypertension.  His INR was markedly  elevated.  He was given two units of FFP, vitamin K, and he also received a  unit of PRBCs.  His coagulopathy has been corrected.  He has stopped  bleeding with this intervention.  He has history of peptic ulcer disease.  In September 2004 he was found to have large prepyloric ulcer.  He never  returned for follow-up exam.  He is undergoing both EGD and colonoscopy.  He  has a porcine mitral valve and therefore was given SBE prophylaxis.   PREMEDICATION:  Cetacaine spray for pharyngeal topical anesthesia, Demerol  15 mg IV, Versed 3 mg IV in divided dose.   FINDINGS:  Procedures performed in endoscopy suite.  The patient's vital  signs and O2 saturation were monitored during the procedure and remained  stable.   PROCEDURE #1:  Esophagogastroduodenoscopy.   The patient was placed in the left lateral recumbent position and Olympus  video scope was passed via oropharynx without any difficulty into the  esophagus.   Esophagus:  Mucosa of esophagus is normal throughout.  The squamocolumnar  junction is unremarkable and located at 33 cm from the incisors.   Stomach:  It was empty and distended very well with insufflation.  Folds of  the proximal stomach were normal.  Examination of the mucosa revealed  prepyloric scarring with deformity to pylorus, which is patent.  Angularis,  fundus and cardia were examined by  retroflexing the scope and were normal.   Duodenum:  Bulbar mucosa revealed edema, erythema, erosions and a 3 mm ulcer  without stigmata of bleeding.  Mucosa and folds in postbulbar duodenum were  normal.  Endoscope was withdrawn and the patient was prepared for procedure  #2.   PROCEDURE #2:  Colonoscopy.   Rectal examination performed.  No abnormality noted on external or digital  exam.  The Olympus video scope was placed in the rectum and advanced under  vision into sigmoid colon and beyond.  Preparation was fair.  A tortuous  sigmoid colon, which was noncompliant.  Slowly and carefully the scope was  passed proximally, finally into the cecum.  There was a large AVM over  ileocecal valve which was not bleeding.  He had few more AVMs involving the  cecum across from the ileocecal valve, and these also were not bleeding.  Since his prep was not good, it was decided not to coagulate any of these  AVMs.  As the scope was withdrawn, colonic mucosa was examined for the  second time and there were  no abnormalities.  The scope was retroflexed to  examine anorectal junction, and small hemorrhoids were noted below the  dentate line.  The endoscope was straightened and withdrawn.  The patient  tolerated the procedure well.   FINAL DIAGNOSES:  1.  Bulbar duodenitis with small ulcer, without stigmata of bleeding.  2.  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  3.  Nonbleeding cecal arteriovenous malformations, which were not coagulated      for reason described above.  4.  Potential sources of bleed would include duodenal ulcer as well as cecal      angiodysplasia.   RECOMMENDATIONS:  1.  Will advance his diet to a 4 g sodium diet.  2.  Coumadin could be resumed in the a.m.  INR will have to be monitored      very closely in order to keep it close to therapeutic range.      __________.      NR/MEDQ  D:  09/03/2004  T:  09/04/2004  Job:  [id number]    cc:   Calvert Cantor, M.D.

## 2010-11-09 NOTE — H&P (Signed)
NAME:  [name], [name]             ACCOUNT NO.:  [id number]   MEDICAL RECORD NO.:  [id number]          PATIENT TYPE:  OUT   LOCATION:  RAD                           FACILITY:  [facility]   PHYSICIAN:  R. Roetta Sessions, M.D. DATE OF BIRTH:  1921-07-10   DATE OF ADMISSION:  01/15/2005  DATE OF DISCHARGE:  LH                                HISTORY & PHYSICAL   CHIEF COMPLAINT:  GI bleed.   HISTORY OF PRESENT ILLNESS:  Mr. Kinslow is an 77 year old gentleman with a  history of GI bleed, felt to be in part coming from a right colon AVM.  He  has an artifical valve and atrial fibrillation.  His Coumadin had to be  stopped.  He has not had any more melena or rectal bleeding.  His hemoglobin  has come up to 12.3.  He has been on iron supplement.  The last colonoscopy  demonstrated diffuse oozing from the right colon.  There were no discrete  lesions to treat endoscopically.  It is felt that it would be ideal for him  to go back on Coumadin if discrete lesions could be found in his right  colon, and if they could be obliterated, then hopefully he could go back on  Coumadin without recurrent bleeding.  Prior colonoscopy on Nov 14, 2004 also  demonstrated multiple colonic polyps which were removed.  EGD back on Nov 14 2004 demonstrated a small hiatal hernia; however, his upper GI tract  appeared normal.   PAST MEDICAL HISTORY:  1.  Significant for atrial fibrillation.  2.  Mitral valve replacement, porcine valve.  3.  History of coronary artery disease.  4.  Chronic obstructive pulmonary disease.  5.  Status post TURP.   CURRENT MEDICATIONS:  1.  Allopurinol 300 mg daily.  2.  Metoprolol 50 mg b.i.d.  3.  Protonix 40 mg daily.  4.  Iron 25 mg daily.  5.  Darvocet daily.  6.  Lipitor 10 mg daily.  7.  Simvastatin 40 mg tablet, half a tablet daily.  8.  Doxycycline 100 mg b.i.d.  9.  Centrum Silver once daily.   ALLERGIES:  No known drug allergies.   FAMILY AND SOCIAL HISTORY:   Noncontributory.   REVIEW OF SYSTEMS:  No recent chest pain, dyspnea on exertion.  No  hematemesis, odynophagia, dysphagia, or early satiety.  No abdominal pain.  No change in weight.   PHYSICAL EXAMINATION:  GENERAL:  A pleasant 77 year old gentleman resting  comfortably, accompanied by his daughter.  VITAL SIGNS:  Weight 220, height 6 foot.  Temperature 97.5, blood pressure  156/88, pulse 88.  SKIN:  Warm and dry.  No jaundice or stigma of chronic liver disease.  HEENT:  No scleral icterus.  Oral cavity with no lesions.  JVD is not  prominent.  CHEST:  Lungs are clear to auscultation.  CARDIAC:  Irregularly regular rhythm with a 2/6 systolic ejection murmur.  ABDOMEN:  Nondistended.  Positive bowel sounds, soft, nontender, without  appreciable mass or organomegaly.   IMPRESSION:  Mr. Mahoney is an 77 year old gentleman with a history of GI  bleed secondary to most like colonic arteriovenous malformations (AVMs) -  (right-sided lesions).  Coumadin had to be stopped.  His hemoglobin has  really stayed up and is almost normal.  Ideally, we would like to see him be  on anticoagulation to decrease the risk of thromboembolic events.  I believe  it is worth one more shot at colonoscopy to see if we could not zero in on  non-bleeding arteriovenous malformations in his right colon and obliterate  them, in the hopes that he could go back on Coumadin and maintain his  hemoglobin.  To this end, I have offered him a repeat colonoscopy with this  intent in mind.  The potential risks, benefits, and alternatives have been  reviewed and questions answered.  He is agreeable.  He will need SBE  prophylaxis just prior to the procedure.  We will make further  recommendations in the very near future.       RMR/MEDQ  D:  01/16/2005  T:  01/16/2005  Job:  [id number]   cc:   Milus Mallick. Lodema Hong, M.D.  [address]  [place], [place] [address]  Fax: [phone]   [place], M.D.

## 2010-11-09 NOTE — Consult Note (Signed)
NAME:  [name], [name] NO.:  [id number]   MEDICAL RECORD NO.:  [id number]          PATIENT TYPE:  INP   LOCATION:  IC10                          FACILITY:  [facility]   PHYSICIAN:  Stana Bunting, M.D.  DATE OF BIRTH:  10-Oct-1921   DATE OF CONSULTATION:  DATE OF DISCHARGE:                                   CONSULTATION   REASON FOR CONSULTATION:  We are asked to see [name] in consultation by  Dr. Butler [name] secondary to hematochezia.   HISTORY OF PRESENT ILLNESS:  [name] is a very-pleasant 77 year old  white male with a past medical history significant for coronary artery  disease as well as atrial fibrillation and mitral valve replacement with a  porcine valve.  He has been on Coumadin for some time secondary to his  atrial fibrillation, and notes that over the last few days, he has had  several episodes of intermittent hematochezia.  It is difficult to get him  to say exactly how much he has been having, although it does appear that he  has had a rather small volume of this.  However, he notes that yesterday  morning, he noted somewhat larger volume of red blood, and subsequently came  into the emergency room.  At that time, he was found to have a hemoglobin of  11.8 with an INR of 9.1.  He was in acute renal failure, and had an episode  of hypotension down in the emergency department.  He subsequently was  admitted to the intensive care unit and has been followed overnight.   On questioning, he denies any abdominal pain, nausea or vomiting.  He had  previously been on some iron and noted he had some black stools, but  otherwise denies any problems with that.  His appetite has been good, and he  denies any dysphagia or reflux.  He denies any chest pain, shortness of  breath or syncope.  He denies any cough.  He is otherwise without  complaints.  I have spoken with the nursing staff, and they note that  overnight, he did have several small bloody stools.  In  spite of this, his  hemoglobin has remained stable at 9.3 to 9.4.  He has received FFP and his  coags this morning are down to 1.6.  He is currently hemodynamically stable.  He has no complaints at this time.   PAST MEDICAL HISTORY:  Significant for coronary artery disease status post a  one-vessel CABG in February of 2003.  He also had a mitral valve replacement  done at that time with a porcine valve.  He also had peripheral vascular  disease, atrial fibrillation.  He has benign prostatic hypertrophy and is  status post a TURP.  He has a history of gout as well as diverticulosis,  although it is not clear to me how the diagnosis of diverticulosis was made,  since he has not undergone previous colonoscopy as best I can tell.  He does  have a history of COPD and degenerative joint disease.  He has a past  medical history also of peptic  ulcer disease and did undergo and upper  endoscopy in September of 2004 which did show a large 2 x 2 cm prepyloric  gastric ulcer.  It is not clear that he has had this followed up since then.   CURRENT MEDICATIONS:  His current medications here in the hospital are only  pantoprazole.   HOME MEDICATIONS:  At home, he is taking Coumadin 5 mg daily, Lopressor 50  mg twice daily, hydrochlorothiazide 12.5 mg daily, lisinopril 40 mg daily,  Darvocet one tablet twice daily, allopurinol 300 mg daily, and Lipitor 10 mg  daily.   ALLERGIES:  The patient denies any drug allergies.   SOCIAL HISTORY:  [name] lives here in [place].  He is a retired  Visual merchandiser and previously served in the [employer].  He does use several  cigarettes per week, but denies smoking daily.  There is no current alcohol  use.   FAMILY HISTORY:  Negative for colon cancer or colon polyps.  There is no  known family history of liver disease.  He had two brothers who he thinks  had lung cancer, both of whom were smokers.  There is also a family history  of strokes.   REVIEW OF  SYSTEMS:  A complete review of systems was performed and is  negative except as noted in the history of present illness.   </   PHYSICAL EXAMINATION:  VITAL SIGNS:  Temperature 97.1, pulse 84,  respirations 15, blood pressure 139/78.  He is saturating 100% on room air.  GENERAL:  [name] is an elderly white male who is alert and oriented.  He is nontoxic.  HEENT:  Atraumatic, normocephalic.  Sclerae is anicteric.  NECK:  Supple.  CARDIOVASCULAR:  [name] is regular although he does have frequent  atopic beats.  CHEST:  Clear to auscultation bilaterally.  I do not appreciate any  wheezing.  ABDOMEN:  Soft, nontender, nondistended.  Positive bowel sounds.  There is  no guarding or rebound.  EXTREMITIES:  Warm, no edema.  NEUROLOGIC:  The patient is alert and oriented.  His affect is appropriate.   LABORATORY DATA:  The patient's INR is currently 1.6.  His most recent  hemoglobin is 9.3, down from 11.8 on admission.  His MCV is 83 with a  platelet count of 176,000.  Sodium 136, potassium 3.8, chloride 110, CO2 19,  BUN is down to 75 today, and his creatinine is 2.9.  Prior endoscopic data  is reviewed in the history of present illness.   IMPRESSION/PLAN:  [name] is an 77 year old white male who has multiple  medical problems.  He is maintained on chronic Coumadin for atrial  fibrillation.  He presents with pain with hematochezia in the setting of a  markedly supratherapeutic INR.  On the basis of his history, I do suspect he  is having a lower GI bleed, and certainly diverticulosis could be the cause,  although I am surprised he would not have a larger volume of blood if that  were the case.  I am doubtful that ischemia is the cause, as he is not  having any symptoms.  Although he could have an upper GI bleed, the history  is not suggestive of that.  Regardless, he initially appeared dehydrated and had some evidence of prerenal azotemia which appears to be resolving.    At this time, he is receiving his first unit of blood, and is doing much  better.  As I do not see  where he has ever had colonoscopy in the past, we  will plan to prep him and prepare him for a colonoscopy on Monday.  At that  time, we  will also repeat his upper endoscopy as he did previously have a large  gastric ulcer noted.  We will follow his counts closely over the weekend,  and certainly will intervene sooner if clinically needed.  I have discussed  the risks and benefits of this with Mr. Petrosky, and he is agreeable with  this course.      RSS/MEDQ  D:  09/01/2004  T:  09/01/2004  Job:  [id number]

## 2010-11-09 NOTE — Discharge Summary (Signed)
[name], [name]             ACCOUNT NO.:  [id number]   MEDICAL RECORD NO.:  [id number]          PATIENT TYPE:  INP   LOCATION:  A306                          FACILITY:  [facility]   PHYSICIAN:  Dennie Maizes, M.D.   DATE OF BIRTH:  1921/12/14   DATE OF ADMISSION:  12/28/2007  DATE OF DISCHARGE:  07/09/2009LH                               DISCHARGE SUMMARY   ATTENDING PHYSICIAN:  Dennie Maizes, MD, urologist.   CONSULTING PHYSICIAN:  The hospitalist.   FINAL DIAGNOSES:  1. Hematuria.  2. Clot retention.  3. Urinary retention.  4. Benign prostatic hypertrophy with bladder neck obstruction.   OTHER DIAGNOSES:  1. Coronary artery disease.  2. Chronic atrial fibrillation.  3. Hypertension.  4. Chronic obstructive pulmonary disease.   OPERATIVE PROCEDURES:  Cystoscopy, evacuation of blood clots, and  fulguration of prostate done on December 30, 2007.   COMPLICATIONS:  None.   DISCHARGE SUMMARY:  This 77 year old male has multiple medical problems.  He complained of having gross hematuria since December 27, 2007.  He had  voiding difficulty and slow urinary stream.  He was passing blood clots  in the urine.  He came to the emergency room for further evaluation.  Foley catheter could not be inserted due to the blockage.  The patient  was admitted to the hospital for observation.   The patient has undergone transurethral resection of prostate several  years ago.  He was admitted with similar symptoms in August 2008.  Cystoscopy, clot evacuation, transurethral resection of prostate was  done at that time.  The patient had been doing well until recently.  He  has urinary frequency and treated for nocturia x1-2.  He has good  urinary flow.  He denied having any fever, chills, flank pain, or  abdominal pain at present.   PAST MEDICAL HISTORY:  1. Coronary artery disease.  2. Chronic atrial fibrillation.  3. Hypertension.  4. Hyperlipidemia.  5. Chronic obstructive pulmonary disease.  6. Gout.  7. History of coronary artery disease status post coronary artery      bypass grafting and mitral valve replacement.  8. History of colonic AVMs.  9. History of peptic ulcer disease.  10.Peripheral vascular disease.  11.Chronic occlusive internal carotid artery.  12.Infrarenal abdominal aortic aneurysm.  13.Anemia.  14.Status post cataract surgery.  15.Status post appendectomy.  16.Status post hernia repair.   MEDICATIONS:  1. Centrum Silver 1 p.o. daily.  2. Metoprolol tartrate 50 mg 1 p.o. b.i.d.  3. Enablex 15 mg 1 p.o. daily.  4. Allopurinol 300 mg 1 p.o. daily.  5. Benazepril hydrochloride 20 mg 1 p.o. b.i.d.  6. Protonix 40 mg 1 p.o. daily.  7. Lipitor 20 mg and 10 mg every Monday, Wednesday, Friday, and      Sundays.   PHYSICAL EXAMINATION:  HEAD, EYES, EARS, NOSE AND THROAT:  Normal.  LUNGS:  Clear to auscultation.  HEART:  Irregular rate and rhythm.  No murmurs.  ABDOMEN:  Soft.  No palpable flank mass or CVA tenderness.  GU:  Suprapubic fullness was noted.  Penis and testes are normal.  RECTAL:  Large  and firm prostate.   ADMISSION LABS:  BUN 60, creatinine 0.97, electrolytes are within  normal.  CBC:  WBC 11.1, hemoglobin 11.0, hematocrit 36.3.  Urinalysis  revealed a large blood, nitrites positive, leukocyte esterase moderate.  Microscopic examination, wbc's 11-25 per high-powered field, rbc's too  numerous to count, bacteria few.   COURSE IN THE HOSPITAL:  The patient was admitted to the hospital and  treated with IV fluids and pain medications.  Urine culture and  sensitivity was done, and he was started on p.o. Cipro.  The patient had  frequent bladder irrigations for removal of blood clots.  After  counseling, he was taken to the operating room on December 30, 2007.  Cystoscopy, evacuation of blood clots, and fulguration of prostate were  done.  He was seen by the hospitalist for management of his medical  problems.  He was seen by the cardiologist for  his cardiac problems.  Please refer to the respective consultation reports.  Hematuria cleared  after the fulguration of the prostate.  Foley catheter was removed, but  the patient failed to void.  A Foley catheter was reinserted.  On December 31, 2007, the patient was discharged and sent home.  He was given ferrous  sulfate for treatment of his anemia.  He was also given Cipro and  Percocet in addition  to his regular medications.  The patient will be reviewed in the office  in 2 weeks; at which time, a trial of voiding will be done.  The  patient's condition at the time of discharge was stable.  He was asked  to call me for fever, chills, blocked catheter, or bleeding.      Dennie Maizes, M.D.  Electronically Signed     SK/MEDQ  D:  02/01/2008  T:  02/02/2008  Job:  [id number]   cc:   Milus Mallick. Lodema Hong, M.D.  Fax: [phone]

## 2010-11-09 NOTE — Op Note (Signed)
[name]. [facility]  Patient:    [name], [name] Visit Number: [id number] MRN: [id number]          Service Type: SUR Location: 2300 [id number] [id number] Attending Physician:  Tressie Stalker Dictated by:   Salvatore Decent. Cornelius Moras, M.D. Proc. Date: 07/29/01 Admit Date:  07/29/2001   CC:         [name]. Dietrich Pates, M.D. [facility]             [name], M.D. [facility]             [name], M.D.                           Operative Report  PREOPERATIVE DIAGNOSES:  Moderate to severe mitral regurgitation with class IV congestive heart failure, coronary artery disease, atrial fibrillation.  POSTOPERATIVE DIAGNOSES:  Moderate to severe mitral regurgitation with class IV congestive heart failure, coronary artery disease, atrial fibrillation.  PROCEDURES:  Median sternotomy for mitral valve replacement (#29 mm Carpentier-Edwards porcine bioprosthesis), coronary artery bypass grafting x1 (left internal mammary artery to distal left anterior descending coronary artery), and modified Cox-Maze III procedure.  ASSISTANT:  Gwenith Daily. Tyrone Sage, M.D.  ANESTHESIA:  General.  BRIEF CLINICAL NOTE:  The patient is a 77 year old white male from [place] followed by Dr. Syliva [name] and Dr. Warwick Bing, who presents with symptoms of class IV congestive heart failure.  His symptoms have dramatically increased over the last several months.  The patient now gets short of breath with minimal activity and frequently at rest.  He is now noted to be in chronic atrial fibrillation.  Transthoracic and transesophageal echocardiograms document the presence of moderate to severe mitral regurgitation with degenerative mitral valve disease.  There is moderate left ventricular dysfunction.  Left and right heart catheterization performed by Dr. Veneda [name] demonstrates moderate proximal disease involving the left anterior descending coronary artery with relatively insignificant disease involving  the left circumflex and right coronary systems.  There is moderate left ventricular dysfunction with severe pulmonary hypertension.  A full consultation note has been dictated previously.  OPERATIVE CONSENT:  The patient and his family have been counseled at length regarding the indications and potential benefits of surgical intervention. They understand the tentative plan for attempt at mitral valve repair with possible need for mitral valve replacement.  They also understand the tentative plan to proceed with coronary artery bypass grafting as well as Maze procedure in an effort to reinstate normal sinus rhythm and alleviate the need for long-term anticoagulation.  They understand and accept all associated risks of surgery, including but not limited to risks of death, stroke, myocardial infarction, respiratory failure, congestive heart failure, arrhythmias, need for permanent pacemaker, bleeding requiring blood transfusion, recurrent coronary artery disease, recurrent valvular heart disease.  All of their questions have been addressed.  They also understand the somewhat high-risk nature of surgery due to the patients degree of cardiac dysfunction as well as his advanced age and other comorbid conditions.  DESCRIPTION OF PROCEDURE:  The patient is brought to the operating room on the above-mentioned date and invasive hemodynamic monitoring is established by the anesthesia service under the care and direction of Bedelia Person, M.D. Specifically, a Swan-Ganz catheter is placed through the right internal jugular approach.  A left radial arterial line is placed.  Intravenous antibiotics are administered.  The patient is placed in the supine position on the operating table.  Following induction with general endotracheal  anesthesia, transesophageal echocardiogram is performed by Dr. Gypsy Balsam.  This confirms the presence of an unusual eccentric jet directed posteriorly on the mitral valve with  associated moderate to severe mitral regurgitation.  There appears to be a flail segment of the posterior leaflet corresponding to the P3 portion of the posterior leaflet.  There is sclerosis of the aortic valve but no significant aortic stenosis.  There is mild aortic insufficiency.  There is mild to moderate left ventricular hypertrophy and severe biventricular enlargement with dilatation of all cardiac chambers.  No other abnormalities are noted.  The patients chest, abdomen, both groins, and both lower extremities are prepared and draped in a sterile manner.  The left femoral arterial line is placed using the Seldinger technique without difficulty.  A median sternotomy incision is performed, and the left internal mammary artery is dissected from the chest wall and prepared for bypass grafting.  The left internal mammary artery is notably good-quality conduit.  The patient is heparinized systemically.  The pericardium is opened.  The patient is noted to have massive cardiomegaly with a somewhat enlarged ascending aorta as well.  The patient is massively volume-overloaded.  The ascending aorta is cannulated for cardiopulmonary bypass without difficulty.  Dual venous cannulation is obtained, placing a right angle 28 French metal-tipped cannula directly in the superior vena cava. A straight 36 French cannula is placed in the inferior vena cava just above the pericardial reflection.  A retrograde cardioplegia catheter is placed through the right atrium into the coronary sinus.  Cardiopulmonary bypass is begun, and the surface of the heart is inspected. The heart is massively enlarged.  There is some scarring in the distal anterior wall and anteroapical portion of the left ventricle consistent with old myocardial infarction.  The left internal mammary artery is trimmed to an appropriate length.  Umbilical loops are placed around both the superior and inferior vena cava.  Cardioplegia  cannula is placed in the ascending aorta.  A  temperature probe is placed in the left ventricular septum.  A Styrofoam pad is placed to protect the left phrenic nerve from thermal injury.  The patient is cooled to 26 degrees systemic temperature.  The aortic crossclamp is applied, and cardioplegia is delivered initially in an antegrade fashion through the aortic root.  Iced saline slush is applied for topical hypothermia.  The initial cardioplegic arrest and myocardial cooling are felt to be satisfactory, although somewhat large arresting dose of cardioplegia is required to achieve adequate septal cooling.  Additional cardioplegia is administered retrograde through the coronary sinus catheter.  Retrograde cardioplegia is again administered intermittently throughout the remainder of the crossclamp portion of the operation to maintain septal temperature below 15 degrees Centigrade.  The following distal coronary anastomosis is performed:  The left internal mammary artery is grafted to the distal left anterior descending coronary artery in an end-to-side fashion.  This coronary measures 2.0 mm in diameter at the site of the distal bypass and is of good quality at that site, although there is palpable and visible severe disease more proximally.  A left atriotomy incision is performed through the standard lateral approach along the interatrial groove.  The mitral valve is initially inspected and noted to have degenerative disease with mild prolapse of the anterior leaflet and severe prolapse of the P3 portion of the posterior leaflet.  Attention is now directed toward creation of the left atrial lesions for the standard Cox-Maze III procedure.  These lesions are created using irrigated radiofrequency ablation with  the [employer] Cardio-Ablate system.  Lesions are created in the standard fashion with the probe using the radiofrequency energy source at either 25 or 30 watts during the entire  procedure.  The complete Cox-Maze III lesion set is performed with no variations.  The only modification is that of use of the irrigated radiofrequency energy source for ablation.  The first lesion set completes the encircling of the right-sided pulmonary veins.  Next, a large elliptical lesion is created around the left pulmonary veins.  The left atrial appendage is everted and is notably massively enlarged.  An elliptical lesion is created around the base of the appendage.  This lesion set is then connected with a linear lesion to the ellipse surrounding the left superior and inferior pulmonary veins.  Another linear lesion is created to connect the elliptical lesion surrounding the left pulmonary veins with the elliptical lesion surrounding the right pulmonary veins.  Finally, a linear lesion is created to connect the elliptical lesion surrounding the left pulmonary veins with the mitral valve annulus.  After completion of the lesion set as described, the left atrial appendage is oversewn with running 3-0 Prolene suture.  Attention is now directed toward repair of the mitral valve.  Exposure is facilitated with a self-retaining retractor.  The valve is again inspected, and there is notably a small area of flail segment of the P3 portion of the posterior leaflet.  There is mild prolapse of the anterior leaflet.  The flail portion of the P3 leaflet is excised using a small quadrangular resection. The remaining portion of the posterior leaflet toward the P2 portion is mobilized off of the posterior annulus to facilitate sliding leafletplasty. The leaflet is reattached to the posterior annulus after placement of two figure-of-eight 2-0 Ethibond compression sutures.  The leaflet is reattached to the posterior annulus with two-layer running 4-0 Ethibond suture.  The linear incision in the posterior leaflet is then closed with interrupted 5-0 Ethibond sutures.  Two 6-0 Gore-Tex sutures are  utilized to create artificial chords to correct the mild prolapse involving the anterior leaflet of the mitral valve.  These are created in the standard fashion and attached to the head of the anterolateral papillary muscle.  Ring annuloplasty is then performed using horizontal mattress 2-0 Ethibond sutures circumferentially around the entire ring.  A 28 mm Seguin annuloplasty ring is inserted.  After completion of the repair, the valve appears to be competent.  Rewarming is begun.  The left atriotomy is closed using a standard two-layer closure wiht running 3-0 Prolene suture.  Immediately prior to completion of the closure, all air is evacuated from the left atrium, left ventricle, and pulmonary veins.  The closure is completed, and one final dose of warm retrograde hot-shot cardioplegia is administered.  The septal temperature is noted to rise rapidly and dramatically upon reperfusion of the left internal mammary artery.  The patient is placed in Trendelenburg position, and numerous maneuvers are again performed to evacuate any residual air in the pulmonary vein, left atrium, left ventricle, and aortic root.  The aorta is vented through the aortic root. The aortic crossclamp is removed after a crossclamp time of 190 minutes.  Attention is now directed to the right-sided lesions for the Maze procedure. The apex of the right atrial appendage is amputated and, of note, there is a small thrombus at the tip of the appendage.  A T incision is then extended laterally in the direction toward the right inferior pulmonary vein.  A second counter  incision is begun more posteriorly the anterior atrial groove and continued in an inferior medial orientation toward the acute margin of the heart.  This incision is completed to the level of the AV groove.  The anterior of the right atrium is exposed.  A linear lesion set is created using the irrigated radiofrequency probe beginning from the apex of the  lateral incision and extending up to the superior vena cava along the lateral wall of the right atrium.  This incision is then continued inferiorly from the same apex along the lateral wall of the right atrium until the inferior vena cava is reached.  Another lesion is now created connecting the incision in the  atrial appendage to the tricuspid valve annulus above the anterior commissure, thereby completely avoiding the vicinity of the triangle of Koch.  Another linear lesion is continued from the lateral border of the tricuspid annulus to the acute margin of the heart at the level of the surgical incision in the right atrium.  Another long linear lesion is then created beginning at the right lateral border of the lateral incision, extending along the posterior aspect of the right atrium, onto the interatrial septum, across the fossa ovalis, and toward the coronary sinus orifice.  Care is taken to stay on the posterior inferior border of the coronary sinus, again to avoid the vicinity of the AV conduction tissue.  The lesion is continued around the posterolateral circumference of the coronary sinus and then continued directly to the tricuspid valve annulus.  Finally, a lesion is continued from the posterior inferior border of the coronary sinus across the isthmus to the inferior vena cava.  As soon as this last lesion is completed, the patient is noted to return to normal sinus rhythm.  The two surgical incisions in the right atrium are subsequently closed using two layers of running 3-0 Prolene suture.  The patient is started on low-dose milrinone and dopamine infusion.  The patient is rewarmed to greater than 37 degrees Centigrade temperature. Epicardial pacing wires are affixed to the right ventricular outflow tract and to the right atrium in the vicinity of the sinus node.  The patient is weaned from cardiopulmonary bypass without difficulty.  The patients rhythm at separation from  bypass is normal sinus rhythm.  Follow-up transesophageal echocardiogram performed at this juncture demonstrates persistent mild to moderate mitral regurgitation.  The persistent regurgitation is noted to consist of a relatively central jet extending posteriorly across the left atrium.  The amount of regurgitation is felt to be too excessive to facilitate completion of the procedure without further intervention.  The patient is recooled to 32 degrees systemic temperature.  The aortic crossclamp is applied, and an occluding atraumatic clamp is applied to the left internal mammary artery graft.  Initial dose of cardioplegia is administered in antegrade fashion through the aortic root.  Further additional doses of cardioplegia are also administered retrograde through the coronary sinus catheter using techniques identical to those described previously.  The left atriotomy incision is reopened, and the mitral valve is exposed.  The existing mitral annuloplasty ring is removed, and the repair is further inspected.  Due to the length of the surgical procedure and continued difficulty with additional anterior leaflet prolapse, mitral valve replacement is felt to be indicated.  Mitral valve replacement is performed using chordal preservation technique.  The free margin of the anterior leaflet is preserved, whereas the majority of the anterior leaflet itself is excised.  The free margin with the associated  primary chords are preserved and divided into two paddles, which are then buried in the replacement sutures in a lateral orientation.  Valve replacement is performed using interrupted pledgeted 2-0 Ethibond horizontal mattress sutures.  The valve is sized to a 29 mm Carpentier-Edwards porcine tissue prosthesis.  Valve replacement is performed uneventfully, and the valve is noted to seat appropriately and without difficulty.  Rewarming is again begun.  The left atriotomy is again closed using a  standard two-layer running closure.  Immediately prior to completion of the closure, all of the same maneuvers are performed to remove air from the left heart circulation.  After completion of the closure, the patient is again placed in Trendelenburg position and the left internal mammary artery graft is reopened.  The septal temperature is noted to rise rapidly and dramatically. One final dose of warm retrograde hot-shot cardioplegia is administered. Further de-airing maneuvers are performed.  The aortic crossclamp is removed after a total second crossclamp time of 78 minutes, such that the entire crossclamp time for the entire surgical procedure is 268 minutes.  The patient is again rewarmed to greater than 37 degrees Centigrade temperature.  All of the atriotomy incisions are inspected for hemostasis. Junctional tachycardia rhythm resumes.  The patient is again restarted on milrinone as well as low-dose dopamine and very low-dose epinephrine infusions.  The patient is weaned from cardiopulmonary bypass without difficulty.  The patients rhythm at separation from bypass is junctional tachycardia.  Total cardiopulmonary bypass time for the entire surgical procedure is 377 minutes.  The venous and arterial cannulae are all removed uneventfully.  Protamine is administered to reverse the anticoagulation.  Coagulopathy is appreciated, and the patient is noted to have thrombocytopenia as demonstrated by measurement of platelet count intraoperatively during cardiopulmonary bypass.  The patient is transfused a total of two 10-packs of adult platelets and four units of fresh frozen plasma due to coagulopathy.  The mediastinum and the left chest are irrigated with saline solution containing vancomycin.  Meticulous surgical hemostasis is ascertained.  The mediastinum and both left and right pleural spaces are drained with four chest tubes placed through separate stab incisions inferiorly.  The  median sternotomy is closed in routine fashion.  The skin incision is closed with subcuticular skin closure.  The patient tolerated the procedure well and is transported to the surgical intensive care unit in stable condition.  There are no intraoperative complications.  All sponge, instrument, and needle counts are verified correct at completion of the operation. Dictated by:   Salvatore Decent Cornelius Moras, M.D. Attending Physician:  Tressie Stalker DD:  07/29/01 TD:  07/30/01 Job: [id number]

## 2010-11-09 NOTE — Cardiovascular Report (Signed)
[place]. [facility]  Patient:    KARLOS, SCADDEN Visit Number: [id number] MRN: [id number]          Service Type: CAT Location: [facility] 2899 [id number] Attending Physician:  Veneda Melter Dictated by:   Veneda Melter, M.D. [facility] Proc. Date: 07/23/01 Admit Date:  07/23/2001   CC:         Clarice Pole. Dietrich Pates, M.D. [facility]   Cardiac Catheterization  PROCEDURE: 1. Right heart catheterization. 2. Left heart catheterization. 3. Left ventriculogram. 4. Selective coronary angiography.  DIAGNOSES: 1. Moderate three vessel coronary artery disease by angiogram. 2. Moderate left ventricular systolic dysfunction. 3. 3-4+ mitral regurgitation. 4. Severe pulmonary hypertension.  HISTORY OF PRESENT ILLNESS:  Mr. Montesinos is a 77 year old white male with a history of coronary artery disease, status post intervention to the LAD in 1995 with mitral regurgitation.  The patient has had progressive dyspnea on exertion to the point that he is unable to perform even simple activities without significant difficulty.  He has undergone further noninvasive assessment with transesophageal echocardiogram showing mild LV dysfunction with mitral valve prolapse and regurgitation and possible disruption of cord. He presents for further cardiac assessment to determine if he is a candidate for surgical intervention.  DESCRIPTION OF PROCEDURE:  Informed consent was obtained.  The patient was brought to the catheterization lab.  A 6 French arterial and 8 French venous sheath were placed in the right groin using modified Seldinger technique. A 7 [place] PA catheter was then advanced from the right femoral vein into the pulmonary artery and appropriate right-sided hemodynamics were obtained.  A 6 French pigtail catheter was then advanced to the left ventricle and appropriate left-sided hemodynamics were obtained.  A left ventriculogram was then performed using power injections of  contrast.  Several catheters were then introduced to engage the left coronary artery and a long JL4 was then selectively positioned.  Selective coronary angiography was then performed using manual injection of contrast.  An AL2 catheter was used to engage the right coronary artery and selective angiography then performed.  After termination of the case, the catheters and sheaths were removed and manual pressure applied until adequate hemostasis was achieved.  The patient tolerated the procedure well and was transferred to the floor in stable condition.  FINDINGS:  Right heart catheterization.  R equals 30/29, mean equals 27.  RV equals 78/18, P equals 77/51, pulmonary capillary wedge pressure is 42/40, mean equals 36.  Cardiac output is 5.2 L/min with cardiac index of 2.4 L/min by thermodilution method.  Aortic saturation is 89%, PA saturation is 49%. Fick cardiac output and index were 1.9 and 0.9, respectively which are felt to be inaccurate due to the patients mitral regurgitation.  Mitral valve gradient is approximately 11 mmHg.  Left heart catheterization.  Left main trunk with mild irregularities.  LAD with a large caliber vessel that provides two small diagonal branches in the proximal segment and two medium caliber diagonal branches distally.  There is moderate focal narrowing of 50% in the midsection of the LAD between the two proximal diagonal branches.  Mild disease of 30% is noted in the remainder of the vessel.  Left circumflex artery is a medium caliber vessel that provides two marginal branches.  The AV circumflex has moderate disease of 30 to 40%.  The second marginal branch has a long tubular narrowing of 60% in its proximal segment.  Right coronary artery is dominant.  This is a large caliber vessel that  provides the posterior descending artery and two posterior ventricular branches.  The terminal segment of the right coronary artery has an ostial narrowing of  50%.  LV mildly dilated end systolic and end diastolic dimensions.  Overall left ventricular function appears moderately impaired.  Ejection fraction approximately 35%.  There is akinesis of the distal anterior wall and slight aneurysmal dilatation of the apex.  3 to 4+ mitral regurgitation is noted.  LV pressure is 126/14, aortic is 126/86, LVEDP equals 26.  ASSESSMENT:  Mr. Likes is a 77 year old gentleman with noncritical coronary artery disease, moderate LV dysfunction, severe mitral regurgitation with resultant pulmonary hypertension.  The patient has become significantly symptomatic from his mitral regurgitation and he is being considered for surgical repair. Dictated by:   Veneda Melter, M.D. [facility] Attending Physician:  Veneda Melter DD:  07/23/01 TD:  07/23/01 Job: [id number]

## 2010-11-09 NOTE — Discharge Summary (Signed)
[place]  Patient:    [name], [name] Visit Number: [id number] MRN: [id number]          Service Type: SUR Location: 2000 2005 01 Attending Physician:  Tressie Stalker Dictated by:   Maxwell Marion, RNFA Admit Date:  07/29/2001 Discharge Date: 08/11/2001   CC:         [name]. Dietrich Pates, M.D. [facility]  Oley Balm. Sung Amabile, M.D. [facility]  [name], M.D.   Discharge Summary  ADMITTING DIAGNOSES: 1. Mild to severe mitral valve regurgitation with class IV congestive heart    failure. 2. Coronary artery disease. 3. Chronic paroxysmal atrial fibrillation.  PAST MEDICAL HISTORY: 1. History of upper GI bleed. 2. Chronic right internal carotid artery occlusion. 3. Gout. 4. History of COPD. 5. Hypertension. 6. History of orthostatic hypotension.  BRIEF HISTORY:  [name] is a 77 year old white man with recent complaint of dyspnea on exertion.  Known history of coronary artery disease, status post stenting of the LAD in 1995 and MR.  Transesophageal echocardiogram performed July 17, 2001 revealed mild mitral valve thickening with disruption of one cord prolapsed posterior leaflet in moderate mitral regurgitation.  Cardiac catheterization was performed on July 23, 2001 and revealed moderate LAD disease, moderate LV dysfunction, severe pulmonary hypertension and history of atrial fibrillation.  Also on July 23, 2001, cardiac surgery consult was obtained with Dr. Tressie Stalker.  After he examined the patient and reviewed his records, including cardiac catheterization films, he recommended repair or replacement of his mitral valve and coronary artery bypass grafting as preferred treatment for this gentleman.  The procedure, risks and benefits were discussed with the patient and his family.  They elected to proceed. Plans were made for elective surgery on July 29, 2001.  He will be discharged home on July 24, 2001 and return to the  hospital on the day of the surgery.  DISCHARGE DIAGNOSES: 1. Mitral valve regurgitation. 2. Coronary artery disease, status post mitral valve replacement and coronary    artery bypass grafting.  HOSPITAL COURSE:  [name] returned to the hospital as planned on July 29, 2001 and underwent the following surgical procedures. The initial attempt at mitral valve repair was unsuccessful; therefore, mitral valve was replaced with 29 mm Carpentier-Edwards porcine bioprosthesis.  He also had a modified Cox-Maze III procedure. Coronary artery bypass graft x one with left internal mammary artery to the distal LAD. He tolerated this procedure well and was transferred in stable condition to the SICU.  His postoperative course has been notable for:  Postoperative day #1, followed by pulmonary service treated with BiPAP and diuresed.  He was hemodynamically maintained on dopamine and amiodarone.  Coumadin therapy was initiated with goal of 3.0 INR.  TNA was also initiated on postoperative day #2.  His heart rhythm in the postoperative course was atrial fibrillation, nonsustained V-TAC and then, ultimately, sinus rhythm which he has maintained the last several days.  His drips were weaned.  A swallowing study was performed which was within normal limits and p.o. intake was initiated and tolerated well.  TNA was discontinued.  He was transferred to unit 2000 on postoperative day #6. While in the hospital, he also had a smoking cessation consult.  [name] is making slow but steady progress in recovering from his surgery. His respiratory status has improved.  He is tolerating his diet well.  His incisions are all healing and he is ambulating well without difficulty or shortness of breath.  His INR  today, August 09, 2001, is 3.1.  Plans will be made for him to go home on Coumadin 2.5 mg a day with repeat PT and INR drawn on August 12, 2001.  CONDITION ON DISCHARGE:  Improved.  INSTRUCTIONS ON DISCHARGE:  Instructions include medications, activity, wound care and follow-up appointments.  Please see discharge instruction sheet for details.  MEDICATIONS ON DISCHARGE:  1. Coumadin 2.5 mg p.o. q.d. or as directed by PT/INR.  2. Enteric-coated aspirin 81 mg p.o. q.d.  3. Altace 1.25 mg p.o. q.d.  4. Lopressor 25 mg p.o. q.d.  5. Lasix 40 mg p.o. q.d.  6. Potassium chloride 20 mEq p.o. q.d.  7. Allopurinol 300 mg p.o. q.d.  8. Amiodarone 400 mg p.o. q.d.  9. Ultram 50-100 mg p.o. q.4-6h. p.r.n. pain. 10. Combivent MDI two puffs q.i.d. 11. Advair MDI one puff b.i.d.  FOLLOWUP: 1. He will have an appointment arranged at [facility]    on Wednesday, August 12, 2001 for PT and INR draw.  The clinic will call    him with the results. 2. He will have an appointment to see Dr. Dietrich Pates in his office in    approximately two weeks.  He will have chest x-ray taken at that time. 3. He has an appointment to see Dr. Cornelius Moras on Monday, August 31, 2001 at 10:00    a.m.  He has been asked to bring his chest x-ray with him to that    appointment. Dictated by:   Maxwell Marion, RNFA Attending Physician:  Tressie Stalker DD:  08/09/01 TD:  08/09/01 Job: [id number]

## 2010-11-09 NOTE — Op Note (Signed)
NAME:  NEEL, BUFFONE                       ACCOUNT NO.:  [id number]   MEDICAL RECORD NO.:  [id number]                   PATIENT TYPE:  INP   LOCATION:  A209                                 FACILITY:  [facility]   PHYSICIAN:  R. Roetta Sessions, M.D.              DATE OF BIRTH:  [birth year]   DATE OF PROCEDURE:  03/03/2003  DATE OF DISCHARGE:                                 OPERATIVE REPORT   PROCEDURE:  Diagnostic EGD.   INDICATIONS FOR PROCEDURE:  The patient is a 77 year old gentleman I met at  the hospital with significant GI bleeding with hemoglobin in the 6 range,  was markedly over-anticoagulated.  He has been partially referred for single-  dose vitamin K and FFP, has remained hemodynamically stable.  I believe he  has received two units of packed red blood cells.  Hemoglobin this morning  is 8.4.  He has remained, again, hemodynamically stable.  INR is 2.0.  EGD  is now being done to further evaluate him as to the cause of bleeding.  This  approach has been discussed with the patient previously.  Potential risks,  benefits, and alternatives have been reviewed.   PROCEDURE NOTE:  O2 saturation, blood pressure, pulse, and respirations were  monitored throughout the entire procedure.   CONSCIOUS SEDATION:  1. Versed 2 mg IV.  2. Demerol 25 mg.  3. Cetacaine spray for topical pharyngeal anesthesia.   ASP PROPHYLAXIS:  1. Ampicillin 2 g IV.  2. Gentamicin 100 mg IV prior to the procedure.   FINDINGS:  Examination of the tubular esophagus revealed no mucosal  abnormalities.  EG junction easily traversed.   STOMACH:  The gastric cavity was emptied, insufflated well with air.  Thorough examination of the gastric mucosa, including retroflexed view of  the proximal stomach and esophagogastric junction was undertaken.  The  patient was noted to have a large, deep 2 x 2 cm prepyloric gastric ulcer at  the 12 o'clock position.  There was no bleeding stigmata.  Please see  photos.   This appeared to be a benign lesion, although the margins were  somewhat heaped up.  Pylorus was patent and easily traversed.   DUODENUM:  Examination of the bulb, second portion revealed no  abnormalities.   THERAPY/DIAGNOSTIC MANEUVERS PERFORMED:  None.   The patient tolerated the procedure well and was reacted in endoscopy.   IMPRESSION:  1. Normal esophagus.  2. Large, deep prepyloric ulcer, as described above without bleeding     stigmata, not manipulated.  Remainder of gastric mucosa appeared normal     with patent pylorus, normal D1, D2.   RECOMMENDATIONS:  1. Would continue proton pump inhibitor therapy obviously but avoid     nonsteroidals from here on out.  2. Okay to resume Coumadin today but would prefer not to bridge him with     heparin so that he is relatively coagulated for the next 3-5  days to     decrease the risk of recurrent bleeding (this would allow his INR to     drift back up slowly with reinstitution of Coumadin).  3. He has never had his colon completely imaged, therefore, would consider     an elective outpatient screening colonoscopy in the near future.  4. He has positive H. pylori serologies.  He needs to be treated.  But     again, I suspect the ulcer found today was related to diclofenac.  Would     plan to treat him with triple drug therapy in 3-4 weeks once he is back     on Coumadin and his dosing regimen has been equilibrated, as doing it     sooner may complicate Coumadin therapy.  5. He will need a repeat elective EGD in three months to document ulcer     healing.                                               Jonathon Bellows, M.D.    RMR/MEDQ  D:  03/03/2003  T:  03/03/2003  Job:  [id number]   cc:   Hanley Hays. Dechurch, M.D.  [address]  [place]  [place] [address]  Fax: [phone]

## 2010-11-09 NOTE — Consult Note (Signed)
[place]  Patient:    ZIMIR, KITTLESON Visit Number: [id number] MRN: [id number]          Service Type: CAT Location: 6500 [id number] Attending Physician:  Veneda Melter Dictated by:   Oley Balm Sung Amabile, M.D. [facility] Proc. Date: 07/24/01 Admit Date:  07/23/2001 Discharge Date: 07/24/2001   CC:         [name]. Dietrich Pates, M.D. [facility] H. Cornelius Moras, M.D.   Consultation Report  DATE OF BIRTH:  [birth year].  REFERRING PHYSICIAN:  Salvatore Decent. Cornelius Moras, M.D.  REASON FOR CONSULTATION:  Preoperative pulmonary evaluation.  HISTORY OF PRESENT ILLNESS:  Mr. Lacroix is a 77 year old gentleman who I initially evaluated in the office on June 16, 2001.  At that time, he reported progressive exertional dyspnea over the past three to four months. He also described paroxysmal nocturnal dyspnea, orthopnea, and denied cough and sputum production.  He had no anginal or pleuritic chest pain.  I noted an extensive cardiac history as documented below.  He was also noted to have a smoking history but had quit 15 to 20 years prior to my evaluation. I felt that his dyspnea was most likely related to cardiac disease, more significantly than pulmonary disease.  He had already been started on bronchodilator therapy and we undertook cardiopulmonary stress testing to better define what was his limitation.  He was noted at the time of my initial evaluation to have mild obstruction with an FEV1 of 1.95 liters which is 64% of predicted value.  On subsequent testing, his FEV1 was noted to be at 1.72 liters.  Lung volumes are normal. Diffuse capacity had tested to mildly reduced initially and moderately reduced on subsequent testing.  I ordered cardiopulmonary stress testing as above but he was most significantly limited by lower extremity edema.  He did have rapid atrial fibrillation with very minimal exercise.  He also reached his ventilatory limit and exhibited  desaturation with exertion.  Overall, the cardiopulmonary stress test did not help [place] distinguish what the predominant factor in his exercise intolerance is.  PAST MEDICAL HISTORY: 1. Coronary artery disease. 2. Ischemic cardiomyopathy with ejection fraction of 35% by echocardiogram in    January of this year. 3. Status post knee surgery, hernia repair, remote appendectomy. 4. Gout. 5. Hypertension. 6. Peripheral vascular disease. 7. Chronic obstructive pulmonary disease. 8. Paroxysmal atrial fibrillation.  SOCIAL HISTORY:  He has an 80-pack-year history of smoking.  He continues to smoke an occasional cigarette but states that he quit smoking on a daily basis about 15 or 20 years ago.  He has no significant occupational or environmental exposure.  FAMILY HISTORY:  This is negative for emphysema, allergies, and asthma.  He does have one brother with a history of lung cancer.  REVIEW OF SYSTEMS:  He denies fevers, chills, or sweats.  He has no nasal or sinus complaints.  He denies dysphagia and reflux.  He denies lower extremity edema and calf tenderness.  He denies nausea, vomiting, diarrhea, and dysuria.  PHYSICAL EXAMINATION:  VITAL SIGNS:  Afebrile with normal vital signs.  Pulse is 80 and irregular. Respirations are 16 and unlabored.  Room air oxygen saturation is 93%.  GENERAL:  He is well-developed, well-nourished and in no acute cardiac or respiratory distress.  HEENT:  No acute abnormalities.  NECK:  Supple without adenopathy.  Jugular venous pulsations are not well visualized.  CHEST:  Mild kyphosis.  Percussion node is normal.  Breath sounds are  mildly diminished with no wheezes or other adventitious sounds.  CARDIOVASCULAR:  Irregularly irregular rhythm with rate well controlled. There is a 3/6 holosystolic murmur heard best at the apex and radiating to the axilla.  ABDOMEN:  Soft with normal bowel sounds.  EXTREMITIES:  No clubbing, cyanosis, or edema.   He does have marked varicosities in both lower extremities.  LABORATORY DATA:  Chest x-ray from July 23, 2001 demonstrates severe cardiomegaly with minimal changes of COPD.  No evidence of acute edema or infiltrates are noted.  Pulmonary function testing is reviewed and a couple of the most recent tests have been placed on the chart.  Cardiopulmonary stress testing was not helpful in our evaluation.  IMPRESSION:  Severe exercise limitation, suspect this is predominantly due to his cardiac disease.  He is scheduled for mitral valve replacement or repair to be performed on July 29, 2001.  He does have mild to moderate chronic obstructive pulmonary disease and still smokes occasionally.  I do not believe his risks of postoperative pulmonary complications is prohibitive, though it is moderately elevated based on his pulmonary disease, cardiac disease, as well as his advanced age.  There is little that we can do to reduce this risk other than smoking cessation.  I have counselled him regarding this.  We also need to provide him with optimal postoperative pulmonary care.  This should include postoperative bronchodilator therapy while hospitalized.  We should mobilize him as early as possible.  We need to pay close attention to the bronchial hygiene.  If he cannot be mobilized in a timely fashion, attention needs to be paid to deep venous thrombosis prophylaxis.  With his cardiac and pulmonary disease as well as changes of venous stasis, he is at high risks for deep venous thrombosis.  I recognize that he might be fully anticoagulated due to his atrial fibrillation and possible mitral valve replacement. Dictated by:   Oley Balm Sung Amabile, M.D. [facility] Attending Physician:  Veneda Melter DD:  07/24/01 TD:  07/24/01 Job: [id number]

## 2010-11-09 NOTE — H&P (Signed)
NAME:  [name], [name]                       ACCOUNT NO.:  [id number]   MEDICAL RECORD NO.:  [id number]                   PATIENT TYPE:  INP   LOCATION:  A209                                 FACILITY:  [facility]   PHYSICIAN:  Hanley Hays. Dechurch, M.D.           DATE OF BIRTH:  [birth year]   DATE OF ADMISSION:  03/01/2003  DATE OF DISCHARGE:                                HISTORY & PHYSICAL   HISTORY OF PRESENT ILLNESS:  A pleasant 77 year old Caucasian male followed  by Dr. [name] and [facility] with a history of chronic  anticoagulation therapy secondary to paroxysmal atrial fibrillation and  history of porcine mitral valve replacement.  The patient has a long history  of recurrent GI bleeding.  Apparently was recently given diclofenac at the  [place] about a week ago.  About three days prior to admission, he noticed some  dark stools and then some bright red blood per rectum.  He was noting  orthostasis, but no chest pain or shortness of breath, and because he had an  appointment on March 01, 2003, he felt he could wait until then rather  than presenting sooner.  He has noted some decreased appetite, and has not  eaten in the last 48 hours.  He notes some burning in his upper abdomen,  though denies frank epigastric pain.  He has had no nausea or vomiting, and  no reflux.  The patient denies any change in his exercise tolerance, but he  states he has been conservative in his activity over the last 2-3 days.  He  was seen in the cardiology clinic this morning where his INR was noted to be  6.8, and his hemoglobin was 7.1, with a blood pressure of 100/70.  As far as  we know, his last hemoglobin was in the 9-10 range, though I am awaiting  confirmation.   MEDICATIONS:  1. Coumadin 7.5 mg daily, except for Thursdays.  2. Diclofenac 75 mg b.i.d. which he has been taking about a week.  3. Allopurinol 300 daily.  4. Metoprolol 50 b.i.d.  5. Aspirin 81 daily.  6. Centrum  Silver one daily.  7. Lasix 40 daily.  8. KCl 20 daily.  9. Lisinopril 20 mg daily.   PAST MEDICAL HISTORY:  1. Chronic atrial fibrillation.  2. Status post mitral valve replacement with a Carpentier-Edwards porcine     valve in February 2003.  Coronary artery bypass grafting x1 at the same     time.  3. History of, I believe, RCA stenting, perhaps 10 years prior, but no     significant occlusive disease otherwise noted during cath preop.  4. Peripheral vascular disease.  Occluded right internal carotid artery.  5. History of cerebral atrophy, but no CVA on MRI and CT in 2003.  6. BPH status post TURP in 2001.  7. History of gout.  8. History of gastritis and duodenitis by endoscopy November 2000  by Dr.     Katrinka Blazing.  9. Diverticulosis and right colon bleed, though study was suboptimal, done     during the admission in November 2000 for similar symptoms.  10.      Peptic ulcer disease in the 1990's, though I do not have     documentation of that, other than references in old charts.  11.      Degenerative joint disease.   SOCIAL HISTORY:  The patient lives alone.  He occasionally uses tobacco.  No  alcohol in 20 years.  He is a retired Visual merchandiser.  He receives his medications  at the [facility], though his medical follow is here per Dr. Lodema Hong and [facility].  His Coumadin is adjusted by the cardiology clinic.   FAMILY MEDICAL HISTORY:  Noncontributory.   REVIEW OF SYSTEMS:  Other than the HPI is unremarkable.   PHYSICAL EXAMINATION:  VITAL SIGNS:  Blood pressure is 100/56, pulse in the  90's, irregularly irregular.  GENERAL:  He is in no distress, alert and appropriate.  Normal neurologic  exam.  NECK:  No JVD.  I cannot detect any bruits.  There is no adenopathy.  LUNGS:  Clear to auscultation, though somewhat diminished.  No rales or  rhonchi are present.  HEART:  Irregular.  There is a 2/6 to 3/6 systolic murmur at the left  sternal border radiating to the apex.  No gallop is  present.  ABDOMEN:  Soft.  Active bowel sounds.  Nontender.  No mass.  EXTREMITIES:  Without clubbing or cyanosis.  No edema is present.  He has  some degenerative changes of the knees.  RECTAL:  The stool is grossly heme positive.   ASSESSMENT AND PLAN:  1. Acute gastrointestinal bleed with recent introduction of nonsteroidal     medication in the setting of Coumadin and aspirin therapy.  The patient     has not had an H. pylori done that I can see.  We will obtain that.     Clearly, he is going to need further evaluation.  Given his chronic     anemia, it would also be good to document whether he has chronic iron     deficiency or not.  At some point, he probably needs a follow up     colonoscopy.  Apparently, the one in 2000 was complicated by delirium,     and they were unable to get a full relaxed exam.  2. Chronic anticoagulation therapy in the setting of paroxysmal atrial     fibrillation and valve replacement, though it was a porcine valve.  He is     also on aspirin which was added because of his occluded right internal     carotid artery and question of a TIA several years prior.  Will need to     be elucidated more thoroughly.  We will give him fresh frozen plasma, one     unit, to gently nudge his INR down and monitor closely for bleeding.  3. Status post mitral valve replacement and coronary artery bypass grafting,     stable.  No evidence of congestive heart failure or decompensation in     this time.  4. Degenerative joint disease particularly of the knees in a patient with     known gout, although do not have a uric acid level.  He is on chronic     allopurinol therapy.  May need to consider injection and/or total knee  replacement at some point if the pain becomes unmanageable.  Certainly,     narcotic pain medication may be reasonable, as well.  5.    History of tobacco abuse and chronic obstructive pulmonary disease, though     he has been stable.  He is not  smoking regularly now.  Will monitor.   The plan was discussed with the patient who has reasonable understanding.                                               Hanley Hays Josefine Class, M.D.    FED/MEDQ  D:  03/01/2003  T:  03/01/2003  Job:  [id number]   cc:   Milus Mallick. Lodema Hong, M.D.  [address]  [place], [place] [address]  Fax: [phone]

## 2010-11-09 NOTE — Op Note (Signed)
NAME:  [name], [name]             ACCOUNT NO.:  [id number]   MEDICAL RECORD NO.:  [id number]          PATIENT TYPE:  AMB   LOCATION:  DAY                           FACILITY:  [facility]   PHYSICIAN:  R. Roetta Sessions, M.D. DATE OF BIRTH:  1922-05-30   DATE OF PROCEDURE:  01/29/2005  DATE OF DISCHARGE:                                 OPERATIVE REPORT   PROCEDURE:  Diagnostic colonoscopy.   INDICATIONS FOR PROCEDURE:  The patient is an 77 year old Caucasian male  with a history of atrial fibrillation and mitral valve replacement. Has been  on Coumadin but was stopped because of current GI bleeding. Is felt to have  AVMs/telangiectasias in his right colon. He was oozing from his right colon  diffusely. Prior colonoscopy Nov 14, 2004. Since stopping Coumadin,  clinically he has not had any bleeding, and his hemoglobin is relatively  stable. He is now being brought back to see if there are discrete lesions  __________. This approach has been discussed with the patient at length  previously. Potential risks, benefits, and alternatives have been reviewed.   MEDICATIONS/ANESTHESIA:  Conscious sedation with Versed 3 mg IV and Demerol  50 mg IV. Ampicillin 2 g IV, gentamicin 100 mg IV prior to the procedure.   INSTRUMENT:  Olympus video chip system.   FINDINGS:  Digital rectal exam revealed no abnormalities.   ENDOSCOPIC FINDINGS:  Prep was adequate.   Rectum:  Examination of the rectal mucosa including retroflexed view of the  anal verge revealed no abnormalities.   Colon:  Colonic mucosa was surveyed from the rectosigmoid junction through  the left, transverse, and right colon to the area of the appendiceal  orifice, ileocecal valve, and cecum. These structures were well seen and  photographed for the record. From this level, the scope was slowly  withdrawn, and all previously mentioned mucosal surfaces were again seen.  The patient again in the right colon had three to four discrete AVMs  and  some telangiectatic changes of the vasculature throughout his right colon.  In fact, in several places, there was a minimal amount of oozing. This was  more or less a diffuse process. There was no discrete lesion.   Really nothing to approach with the gold probe. Please see photos. The  remainder of the colonic mucosa appeared normal. The patient tolerated the  procedure well and was reactive to endoscopy.   IMPRESSION:  1.  Normal rectum.  2.  Vascular malformation rather diffusely, right colon. The remainder of      colonic mucosa appeared normal.   This presents a difficult clinical scenario. These lesions may be amendable  to Argon plasma coagulation, a modality we do not currently have at [facility].   At this point, Coumadin should not be reinstituted as he will almost  certainly bleed. I will offer Mr. Poche tertiary referral to [facility] to get their opinion as to the management of this  nice gentleman with a challenging problem.       RMR/MEDQ  D:  01/29/2005  T:  01/29/2005  Job:  [id number]   cc:   Milus Mallick. Lodema Hong, M.D.  [address]  [place], [place] [address]  Fax: [phone]   [place], M.D.

## 2010-11-09 NOTE — Discharge Summary (Signed)
[name], [name]             ACCOUNT NO.:  [id number]   MEDICAL RECORD NO.:  [id number]          PATIENT TYPE:  INP   LOCATION:  IC10                          FACILITY:  [facility]   PHYSICIAN:  Osvaldo Shipper, MD     DATE OF BIRTH:  Oct 12, 1921   DATE OF ADMISSION:  08/31/2004  DATE OF DISCHARGE:  03/14/2006LH                                 DISCHARGE SUMMARY   DISCHARGE DIAGNOSES:  1.  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  2.  Duodenal bulbar ulcer.  3.  Coronary artery disease.  4.  Atrial fibrillation, on Coumadin.   Please refer to the H&P dictated March 10th for details regarding the  patient's presenting illness.   BRIEF HOSPITAL COURSE:  The patient is an 77 year old white male who was  admitted to the hospital with lower GI bleed.  He gave a history of passing  bright red blood per rectum for 2 days prior to admission.   The patient was seen by the GI service at the hospital.  He underwent a  colonoscopy as well as an EGD.  The colonoscopy showed a large cecal AVM at  the ileocecal valve with a few more across from the valve.  However, no  active bleeding was noted.  Therefore it was not treated.  Back in 2004, the  patient underwent an EGD which showed a deep prepyloric ulcer without active  bleeding.  The patient was supposed to return to follow up.  However, he did  not.  That is why a repeat EGD was done this admission, which showed that  that particular ulcer had healed.  However, there was a new ulcer seen in  the duodenal bulbar region.  There was no active bleeding seen.   I called the pharmacist at [facility] to confirm that the patient was  treated for H. pylori which was positive in 2004.  The pharmacist did  confirm that the patient was prescribed the triple drug regimen, and he did  pick up the prescription.   On admission, the patient was also found to have supertherapeutic INR  secondary to his  Coumadin use.  This was reversed.  He was given vitamin K  and FFP.  The patient is on Coumadin for A-fib, and will need to be on it.  Gastroenterology okayed the patient to continue with the Coumadin starting  today.  I will be discharging the patient home this a.m. today.   On the day of discharge, his vital signs were all stable.  He is  asymptomatic.  He has been seen by Occupational Therapy and has been cleared  for discharge.  The patient will need home services setup for assistance at  home, since he lives alone.   DISCHARGE MEDICATIONS:  1.  Coumadin 5 mg p.o. q.h.s.  2.  Metoprolol 50 mg p.o. b.i.d.  3.  Hydrochlorothiazide 12.5 mg p.o. daily.  4.  Lisinopril 40 mg p.o. daily.  5.  Lipitor 10 mg p.o. daily.  6.  Allopurinol 300 mg p.o. daily.  7.  Protonix 40 mg  p.o. b.i.d.   DIET:  The patient may eat a low-sodium, low-cholesterol diet.   PHYSICAL ACTIVITY:  The patient will ambulate using a cane.  He has been  seen by OT and has been cleared for discharge.   FOLLOW-UP CARE:  1.  With Dr. [name].  An appointment has been set up for him on      Tuesday, March 21st at 2:30 p.m.  2.  He needs to see his cardiologist at [facility] within 2 weeks.   Home health services will need to be set up for this patient and I will make  arrangements to do the same.      GK/MEDQ  D:  09/04/2004  T:  09/04/2004  Job:  [id number]   cc:   Milus Mallick. Lodema Hong, M.D.  [address]  [place], [place] [address]  Fax: [phone]   Lionel December, M.D.  [address]  [place]  [place] [address]   [place] Bing, M.D.

## 2010-11-09 NOTE — Consult Note (Signed)
NAME:  [name], [name]                       ACCOUNT NO.:  [id number]   MEDICAL RECORD NO.:  [id number]                   PATIENT TYPE:  INP   LOCATION:  A209                                 FACILITY:  [facility]   PHYSICIAN:  Lionel December, M.D.                 DATE OF BIRTH:  07-Feb-1922   DATE OF CONSULTATION:  03/01/2003  DATE OF DISCHARGE:                                   CONSULTATION   REASON FOR CONSULTATION:  GI bleed and anemia.   HISTORY OF PRESENT ILLNESS:  [name] is a 77 year old Caucasian male who  was in usual state of health until about 2 days ago when he noted some blood  in his stool.  He also felt somewhat weak and dizzy.  He decided to wait  until his appointment at the coagulation clinic today.  Yesterday, however,  he passed a large amount of  dark or burgundy stool.  He also noted vague  soreness in his lower to mid abdomen but did not experience any nausea or  vomiting.  His lab studies revealed hemoglobin 7.4, hematocrit 43.8.  His MC  was 73.8, platelet count 443,000.  His PT was 37.3, INR 6.8.  The patient  was, therefore, immediately hospitalized to a monitored bed.  He gives a  history of intermittent and frequent heartburn but denies dysphagia or  epigastric pain.  He does have history of peptic ulcer disease with GI bleed  about 8 or 10 years ago.  The patient tells me he was taking too much  aspirin at that time.  He has been experiencing bilateral knee pain.  He was  seen at the [facility] last week and begun on diclofenac.  He has had a good  appetite.  He had cardiac surgery last year, prior to which he lost several  pounds, but since then, he has gained 15 pounds.  His bowels generally move  regularly.  He has not had any bowel movement in the last 5 hours since he  has been in his room.  The patient has received 1 unit of FFP.  Dr. Josefine Class  is planning to give him 2 units of PRBCs, but he has some antibodies, and  his blood should be here any  moment.   MEDICATIONS:  1. He was on Coumadin 5 mg daily.  2. Diclofenac 75 mg b.i.d.  3. Allopurinol 300 mg daily.  4. Metoprolol 50 mg b.i.d.  5. ASA 81 mg daily.  6. Centrum Silver daily.  7. Lasix 40 mg q.a.m.  8. KCl 30 mEq daily.  9. Lisinopril 20 mg daily.   PAST MEDICAL HISTORY:  1. He has coronary artery disease.  He had coronary stenting back in 1996.  2. He has a history of atrial fibrillation.  3. He had a CABG in February of last year.  4. Mitral valve replacement in February last year.  5. He received  2 units of PRBCs some time last fall but did not have any     workup.  6. The patient was admitted to [facility] in November 2000 with a 5     unit bleed.  He had EGD and colonoscopy per Dr. Jerolyn Shin C. Smith.  EGD     revealed duodenitis but no stigmata of bleeding.  Colonoscopy revealed     left colonic diverticulosis.  There was some old blood in his right     colon, but no lesion was noted.  7. He also has osteoarthrosis.  8. Gout.  9. He had TURP in May of 2001.   PAST SURGICAL HISTORY:  1. Left knee arthroscopy.  2. Appendectomy in 1944.  3. He had right inguinal herniorrhaphy.  4. He has had bilateral cataract extraction.   ALLERGIES:  None known.   FAMILY HISTORY:  Two brothers are deceased of carcinoma; one was in his 13s.  Another brother died of a stroke.  He has 1 sister living.   SOCIAL HISTORY:  He is retired.  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  He __________ about 9 years ago.  He has two sons and a daughter  living.  One son died of auto accident at age 25.  He used to drink alcohol  in excessive amount but quit 20 years ago.  He smoked on a regular basis,  but the last few years, he has smoked sporadically.  The last time he smoked  a cigarette was over a week ago.   PHYSICAL EXAMINATION:  GENERAL:  A pleasant, well-developed, well-nourished  Caucasian male who was in no acute distress.  VITAL SIGNS:  He weighs 215  pounds.  He is 6 feet 2 inches tall.  Pulse  56/M, blood pressure 101/56, respirations 20, and temperature 98.1.  HEENT:  Conjunctivae is pale.  Sclerae is nonicteric.  Oropharyngeal mucosa  is normal.  He has upper and lower dentures in place.  NECK:  Without masses or thyromegaly.  JVD is less than 5 cm.  CHEST:  He has a large tattoo over his anterior chest along with smaller  ones.  CARDIAC:  Regular rhythm, normal S1 and S2.  He has a grade 2/6 long  systolic murmur at LLSB and aortic area.  LUNGS:  Clear to auscultation.  ABDOMEN:  Symmetrical.  Bowel sounds are normal.  Palpation reveals soft  abdomen with mild tenderness at mid abdomen with no organomegaly or masses.  RECTAL:  Examination reveals a large prostate.  He has a scant amount of  stool, which is more or less burgundy and strongly heme positive.  EXTREMITIES:  He has multiple tattoos over his upper and lower extremities.  He does not have clubbing or koilonychia.  He also does not have peripheral  edema.   LABORATORIES ON ADMISSION:  WBC 10.1, H&H 7.4 and 23.8, MCV 73.8,  prothrombin time 37.3, INR 6.8.  Sodium 139, potassium 5, chloride 108, CO2  27, glucose 119, BUN 25, creatinine 1.3, calcium 8.7.  His bilirubin is 0.5,  AP 75, AST 15, ALT 13, and albumin is 3.4.   ASSESSMENT:  1. [name] is a 77 year old Caucasian male who presents with acute     gastrointestinal bleed and profound anemia.  His anemia is microcytic,     which would suggest chronic gastrointestinal blood loss on top of an     acute bleed.  He presented with weakness and postural symptoms.  The     patient is overanticoagulated.  He is on low-dose ASA and was begun on     nonsteroidal anti-inflammatory drug 1 week ago.  I suspect he is losing     blood from his upper gastrointestinal tract, but he could have     nonsteroidal-anti-inflammatory-drug-induced injury to his small or large    bowel.  Given he has microcytosis, it may not be a bad  idea to examine     his colon as well since his last colonoscopy was in November 2000 and     right colon was not well seen because of presence of blood, as indicated     in Dr. Jerolyn Shin C. Smith's notes.  2. The patient has prostatic mitral valve prolapse and will need subacute     bacterial endocarditis prophylaxis.   RECOMMENDATIONS:  1. I agree with plan to give him 2 units of PRBCs.  2. We would like to see his INR correct down to around 2 before endoscopic     evaluation is undertaken.   PLAN:  1. To proceed with esophogastroduodenoscopy in a.m.  2. He will receive 2 grams of ampicillin and gentamycin 100 mg IV for SBE     prophylaxis.  3. I feel that he should also undergo colonoscopy during this     hospitalization, but final decision would be deferred until EGD has been     completed.   We would like to thank Dr. Josefine Class for the opportunity to participate in  the care of this gentleman.                                               Lionel December, M.D.    NR/MEDQ  D:  03/01/2003  T:  03/01/2003  Job:  [id number]

## 2010-11-09 NOTE — Discharge Summary (Signed)
NAME:  [name], [name]                       ACCOUNT NO.:  [id number]   MEDICAL RECORD NO.:  [id number]                   PATIENT TYPE:  INP   LOCATION:  A209                                 FACILITY:  [facility]   PHYSICIAN:  Hanley Hays. Dechurch, M.D.           DATE OF BIRTH:  08-Aug-1921   DATE OF ADMISSION:  03/01/2003  DATE OF DISCHARGE:  03/06/2003                                 DISCHARGE SUMMARY   DIAGNOSES:  1. Acute gastrointestinal bleed, probably nonsteroidal-induced prepyloric     ulcer by EGD.  2. Helicobacter pylori positive, treatment deferred to GI after discharge.  3. Chronic iron deficiency.  4. Degenerative joint disease.  5. Chronic atrial fibrillation/chronic anticoagulation therapy.  6. Status post mitral valve replacement with a Carpentier-Edwards porcine     valve in 2003, coronary artery bypass grafting x1.  7. History of right coronary artery stenting, no significant occlusive     disease, occluded right internal carotid artery.  8. History of cerebral atrophy but no cerebrovascular accident.  9. Benign prostatic hypertrophy status post transurethral resection of     prostate in 2001.  10.      History of gout.  11.      History of gastritis/duodenitis by endoscopy in the past.  12.      Diverticulosis.   DISPOSITION:  The patient discharged to home.   FOLLOW UP:  Followup cardiology in the Coumadin clinic.  Followup Dr.  Lodema Hong 7-10 days.  Followup GI in 3 months for elective EGD provided the  patient remain stable.   PROCEDURES:  1. EGD.  2. Blood transfusion.   HOSPITAL COURSE:  [age]-year-old male recently placed on diclofenac  per the [place] for arthritis pain, he is also on Coumadin and aspirin because of  valve replacement and coronary artery disease as well as significant  peripheral vascular disease who noted decrease appetite and shortness of  breath 2-3 days prior to admission as well as epigastric pain but no nausea  or vomiting, he  noted some orthostasis as well.  He presented to the  cardiology clinic where his INR was 6.8 and hemoglobin was 7.1.  He was  admitted to the hospital for further evaluation, stools were grossly  Hemoccult positive, followup hemoglobin was 6.5.  The patient received FFP  and packed red blood cells, once his INR was acceptable he underwent  endoscopy which revealed a large deep prepyloric ulcer, subsequent H. pylori  antibody was positive.  The patient remained stable throughout the entire  hospitalization.  His symptoms improved with the addition of Protonix and he  was stable for discharge.  He was discharged to home by  Dr. Delbert Harness.  His exam was stable and without any significant new  findings.  His medical regimen at the time of discharge included Coumadin 5  mg daily, lisinopril 20 daily, Lopressor 50 b.i.d., Lasix 40 daily, KCl 10  b.i.d., allopurinol 300 daily, and  Protonix 40 daily.     ___________________________________________                                         Hanley Hays Josefine Class, M.D.   FED/MEDQ  D:  03/29/2003  T:  03/29/2003  Job:  [id number]   cc:   Milus Mallick. Lodema Hong, M.D.  [address]  [place], [place] [address]  Fax: [phone]

## 2010-11-09 NOTE — Consult Note (Signed)
[place]. [facility]  Patient:    [name], [name] Visit Number: [id number] MRN: [id number]          Service Type: CAT Location: [id number] Attending Physician:  Veneda Melter Dictated by:   Salvatore Decent. Cornelius Moras, M.D. Proc. Date: 07/23/01 Admit Date:  07/23/2001   CC:         Veneda Melter, M.D.  [name]. Dietrich Pates, M.D. [facility]  [name], M.D.  Oley Balm Sung Amabile, M.D. [facility]  CVTS Office   Consultation Report  REQUESTING PHYSICIAN:  Veneda Melter, M.D.  PRIMARY CARDIOLOGIST:  [name]. Dietrich Pates, M.D.  PRIMARY CARE PHYSICIAN:  [name], M.D.  REASON FOR CONSULTATION:  Moderate-to-severe mitral regurgitation with class IV congestive heart failure and chronic paroxysmal atrial fibrillation.  HISTORY OF PRESENT ILLNESS:  The patient is a 77 year old widowed white male from [place] with history of mitral regurgitation, coronary artery disease, chronic paroxysmal atrial fibrillation, and hypertension. He presents with progressive symptoms of severe and quite limiting dyspnea on exertion which has progressed dramatically over the last 9 months. He now gets shortness of breath with minimal physical activity and frequently gets short of breath even at rest or waking him up from his sleep in the middle of the night. He had been quite active previously and in fact had been working at a [employer] until September 2002. He had to stop at at that time due to severe limiting shortness of breath with physical activity. Since then his condition has deteriorated further. He was evaluated by Dr. Dietrich Pates on July 14, 2001. He was found to have a markedly elevated B naturetic peptide level at that time which measured 1,110. He was noted to have at least moderate mitral regurgitation with associated dilation of the left atrium. He was found to be in atrial fibrillation. He underwent a transesophageal echocardiogram on July 17, 2001, by  Dr. Dietrich Pates at [facility]. This test is not immediately available for review, but by report, it was notable for findings of at least moderate-to-severe mitral regurgitation associated with prolapse of the posterior leaflet of the mitral valve. There was mild-to-moderate dilation of the left ventricle and borderline left ventricular hypertrophy with estimated ejection fraction 40%-45%. There was moderate dilation of the left atrium. There was normal tricuspid valve with reportedly no sign of tricuspid regurgitation. There was mild aortic insufficiency with mild aortic valvular disease. The patient subsequently was brought in for elective left and right heart catheterization today by Dr. Veneda Melter. This demonstrates only relatively mild atherosclerotic coronary artery disease including 50% stenosis of the mid left anterior descending coronary artery, 60% stenosis of the distal left circumflex coronary artery after the large 1st circumflex marginal branch, 30%-50% ostial stenosis of the right coronary artery. There was at least moderate mitral regurgitation with severe pulmonary hypertension. The mitral regurgitation was graded 3-4+ and completely filled the left atrium. The patients left ventricular and diastolic pressure was moderately elevated and estimated to be 26 mmHg. The pulmonary capillary wedge pressure was 36, and the PA pressure measured 77/51. The patients baseline cardiac output was 5.2 with a cardiac index of 2.4 using thermodilution. The patients mean central venous pressure was 27 mmHg. Left ventriculogram was notable for severe hypokinesis or akinesis of the distal anterior apical wall of the left ventricle with moderate left ventricular dysfunction and baseline ejection fraction estimated 35%-40%. Cardiac surgical consultation is requested.  REVIEW OF SYSTEMS:  CARDIAC:  The patient denies any symptoms of  angina or chest discomfort. He reports severe and very  limiting exertional shortness of breath with occasional episodes of resting shortness of breath. He has had occasional episodes of orthopnea as well as PND. He has had occasional palpitations but he denies any syncopal or near-syncopal spells. He has had only mild intermittent left bilateral lower extremity edema which he states is not new. GENERAL:  The patient reports that he does get somewhat easily fatigued with activity and has associated mild generalized weakness. He had been very active physically for a gentleman of his age up until the last few months. He still drives a car and lives independently. He reports that his appetite seems somewhat marginal, although he denies any weight loss recently. RESPIRATORY:  Notable for severe dyspnea on exertion as well as intermittent dry, nonproductive cough. The patient denies any history of hemoptysis or wheezing. He states that his symptoms do not seem to be improved by use of nebulized albuterol or Atrovent treatments. GASTROINTESTINAL:  Notable for somewhat marginal appetite by report but otherwise entirely unremarkable. The patient denies any problems with constipation or diarrhea. He has had no recent symptoms or hematochezia, hematemesis, or melena. NEUROLOGIC:  Negative and notable for the absence of any symptoms of TIA or amaurosis fugax. MUSCULOSKELETAL: Essentially negative. The patient has had mild chronic problems with his left knee in particular as well as some symptoms of gout in the past. He has no ongoing symptoms. GENITOURINARY:  Negative. INFECTIOUS: Negative. HEMATOLOGIC:  Negative. ENDOCRINE:  Negative. PSYCHIATRIC: Negative. PERIPHERAL VASCULAR:  Negative. The patient denies symptoms of claudication.   PAST MEDICAL HISTORY:  Notable for mitral regurgitation and coronary artery disease. The patient underwent PTCA and stenting of the left anterior descending coronary artery in 1995. His most recent cardiac  catheterization was in 1999 and notable for insignificant coronary artery disease but at least moderate-to-severe mitral regurgitation. The patients left ventricular ejection fraction was 50% at that time. The patients past medical history is also notable for history of paroxysmal atrial fibrillation. He apparently has been on Coumadin at some point in the past but has not been on Coumadin recently. He has history of hypertension. He has history of chronic occlusion of the right internal carotid artery by duplex scanning. He has history of chronic obstructive pulmonary disease and has been seen by Dr. Onalee Hua B. Simonds, and apparently has undergone pulmonary function test recently. The results of these tests are not currently available. He has history of heavy tobacco use in the distant past, although he quit smoking approximately 30 years ago. Approximately 1 year ago he started smoking again, although he states that he only smokes perhaps 1 or 2 cigarettes per day and has never done more than this recently.  PAST SURGICAL HISTORY:  Notable for previous appendectomy and left inguinal hernia repair both in the remote past. The patient has had left knee surgery. The past medical history is also notable for upper GI bleed approximately 7 years ago. The patient apparently underwent emergency EGD at that time for sclerosis of a bleeding ulcer. He has history of gout.  SOCIAL HISTORY:  The patient is a widower and currently lives alone. He has 2 daughters, 1 of whom lives next door and the other of whom lives in [place] . They are very supportive. He has been very independent and continues to care for himself and drive an automobile on his own. He has a remote history of heavy alcohol consumption but has quit more than 30 years  ago.  FAMILY HISTORY:  Notable for 1 brother who died of myocardial infarction in his 93s. His mother died of a stroke at age 19, and his father died in his 56s of  unclear etiology.  CURRENT MEDICATIONS:  Include aspiring 81 mg p.o. once daily, Altace 10 mg p.o. twice daily, Lasix 40 mg p.o. twice daily, potassium chloride 20 mEq p.o. once daily, metoprolol 50 mg p.o. twice daily, allopurinol 300 mg p.o. once daily, Advair Diskus 100/50 1 puff twice daily, albuterol and Atrovent nebulized treatments which have just been prescribed recently, and Mucomyst treatments which have also just been prescribed recently.  ALLERGIES:  The patient denies any known drug allergies or sensitivities.  PHYSICAL EXAMINATION:  GENERAL:  Notable for a well-appearing, elderly white male who appears his stated age in no acute distress. His skin is somewhat thin and frail, but there are not obvious skin lesions or ulcerations. By telemetry monitor, he appears to be in atrial fibrillation with a controlled ventricular rate in the 70s-80s.  VITAL SIGNS:  He is currently normotensive and afebrile with most recent blood pressure measured 138/90. His height is estimated 6 feet 3 inches tall, and his weight is estimated 214 pounds.  HEENT:  Entirely unremarkable. The patient is edentulous and wears a full set of upper and lower dentures.  NECK:  Supple. There is no cervical or supraclavicular lymphadenopathy. There is no jugular venous distention. No carotid bruits are noted.  CHEST:  Auscultation of the chest reveals a few bibasilar inspiratory crackles. No wheezes or rhonchi noted.  CARDIOVASCULAR:  Demonstrates irregular heart rhythm. There is a loud grade 4/6 systolic murmur heard best across the left chest with radiation to the axilla. No diastolic murmurs or gallops noted.  ABDOMEN:  Soft, nontender. The liver edge is not enlarged. There are no palpable masses.  EXTREMITIES:  Warm and well-perfused. There is mild bilateral lower extremity edema. There are severe varicose veins involving the left lower leg beginning at the knee and extending to the ankle. There  are mild varicose changes on the right lower leg. Distal perfusion is intact symmetrically bilaterally. Distal pulses are trace palpable in both lower legs although moderately diminished.  RECTAL/GU:  Both deferred.  NEUROLOGIC:  Grossly nonfocal and symmetrical throughout. The remainder of his physical examination is unrevealing.  DIAGNOSTIC TESTS:  Cardiac catheterization films are reviewed and results are as described previously. The recently performed transesophageal echocardiogram tape is not currently available for review.  IMPRESSION:  Moderate-to-severe mitral regurgitation which is chronic and longstanding and now associated with class IV congestive heart failure and moderate left ventricular dysfunction. The patient also has mild underlying coronary artery disease although he does not appear to have any ongoing symptoms of angina. He has history of paroxysmal atrial fibrillation and appears to be in chronic atrial fibrillation at the present time. His symptoms have rapidly progressed over the last 6-9 months. I believe that his long-term prognosis with continued medical therapy would be relatively poor, as he now has significant left ventricular dysfunction with associated hypertension. He has numerous other comorbid conditions including underlying chronic lung disease, relatively advanced age, peripheral vascular disease with history of right internal carotid artery stenosis, ongoing tobacco use, gout, and longstanding hypertension. I believe that he would certainly benefit from surgical intervention for possible mitral valve repair or replacement as well as possible Maze procedure to reestablish sinus rhythm and eliminate the need for long-term anticoagulation. The risks of surgery are elevated due to the patients  advanced age and underlying chronic lung disease as well as other comorbidities.  PLAN:  I have discussed issues at length with the patient, his daughter,  Mrs.  Naida Sleight, Dr. Dietrich Pates, and Dr. Sung Amabile. Dr. Sung Amabile will evaluate the patient in consultation to provide information regarding the severity of the patients underlying chronic lung disease. We tentatively plan to proceed with surgery on Wednesday, July 29, 2001, for mitral valve repair, possible mitral valve replacement, possible Maze procedure, and coronary artery bypass grafting. The patient and his daughter understand and accept the associated risks of surgery included but not limited to risks of death, stroke, myocardial infarction, respiratory failure requiring prolonged ventilatory support, arrhythmia, and possible recurrent atrial fibrillation, bleeding requiring blood transfusion, need for a permanent pacemaker (estimated risk 15%-20%), infection, pneumonia, recurrent coronary artery disease or recurrent valvular heart disease. They understand the relative risks and benefits of surgical intervention and desire to proceed as described. All of their questions have been addressed. Dictated by:   Salvatore Decent Cornelius Moras, M.D. Attending Physician:  Veneda Melter DD:  07/23/01 TD:  07/24/01 Job: [id number]

## 2010-11-09 NOTE — Procedures (Signed)
[facility]  Patient:    [name], [name] Visit Number: [id number] MRN: [id number]          Service Type: [facility] Location: [facility] Attending Physician:  Purcell Nails Dictated by:   Kari Baars, M.D. Admit Date:  09/23/2001                            EKG Interpretations  TIME:  1312 on September 04, 2001  INTERPRETATION:   The rhythm appears to be an accelerated junctional rhythm with a rate of about 90.  There was left ventricular hypertrophy with strain, although these are junctional beats.  T wave abnormalities are seen laterally which could be due to ischemia.  IMPRESSION:  Abnormal electrocardiogram. Dictated by:   Kari Baars, M.D. Attending Physician:  Purcell Nails DD:  10/17/01 TD:  10/18/01 Job: [id number]

## 2010-11-09 NOTE — H&P (Signed)
NAME:  [name], [name]             ACCOUNT NO.:  [id number]   MEDICAL RECORD NO.:  [id number]          PATIENT TYPE:  INP   LOCATION:  IC10                          FACILITY:  [facility]   PHYSICIAN:  Calvert Cantor, M.D.     DATE OF BIRTH:  07-18-1921   DATE OF ADMISSION:  08/31/2004  DATE OF DISCHARGE:  LH                                HISTORY & PHYSICAL   Patient of Dr. Lodema Hong, Cardiology is [facility].   PRESENTING COMPLAINT:  Bright red blood per rectum.   HISTORY OF PRESENT ILLNESS:  This is an 77 year old white male with a  significant past medical history of GI bleed, coronary artery disease, who  states he has been having bright red blood per rectum since two days.  He is  saying that it is not profuse.  It does not occur with a bowel movement.  It  sometimes occurs after he has sneezed, and then he will notice a few drops  of blood in his rectal/anal area.  He has not had any dizziness, no  lethargy, no shortness of breath, no abdominal pain.  He has not complained  of any fever, dysuria, diarrhea.  He has not had any chest pain or chest  pressure.  All other review of systems is negative.   PAST MEDICAL HISTORY:  1.  Chronic atrial fibrillation for which he has been on Coumadin.  2.  Mitral valve replacement with a porcine valve and a coronary artery      bypass graft x1, both done in February 2003.  3.  History of RCA stenting about 10+ years ago.  4.  Peripheral vascular disease, occluded right internal carotid artery.  5.  History of cerebral atrophy.  6.  BPH status post TURP in 2001.  7.  History of gout.  8.  History of diverticulosis and right colon bleed.  9.  History of degenerative joint disease for which he takes Darvocet.  10. History of smoking with COPD.  11. History of peptic ulcer disease.  He was scoped in September 2004, again      this was because he was having an acute GI bleed.  At that time a      prepyloric ulcer was found and he was H.  Pylori positive.  12. He has also got a history of chronic iron deficiency.   PAST SURGICAL HISTORY:  1.  Mitral valve replacement with a porcine valve.  2.  Coronary artery bypass graft.  3.  TURP.  4.  Left-sided knee surgery.  5.  Cardiac catheterization.   SOCIAL HISTORY:  He has a remote history of smoking.  He has not been  smoking for many years.  He does not drink alcohol.  He is widowed.  He  lives alone.   MEDICATIONS:  1.  Coumadin 5 mg daily.  2.  Metoprolol 50 mg b.i.d.  3.  Hydrochlorothiazide 12.5 mg daily.  4.  Lisinopril 40 mg daily.  5.  Darvocet-N 1 tablet b.i.d.  6.  Allopurinol 300 mg daily.  7.  Lipitor 10 mg daily.   ALLERGIES:  HE HAS NO KNOWN DRUG ALLERGIES.   PHYSICAL EXAMINATION:  Elderly white male sitting comfortably in bed.  His  temperature was 97 degrees, his blood pressure in the ER was 70/29, slowly  has improved to 104/64 status post normal saline boluses.  His pulse is in  the 70s and 80s, this is secondary to beta-blockade.  Respiratory rate is  around 18, pulse ox is 97% on room air.  HEENT:  Atraumatic, normocephalic.  Pupils equal, round and reactive to  light and accommodation.  Extraocular movements are intact.  Conjunctivae  are pink.  Mucosa is moist.  NECK:  Supple, there is no JVD, there is no lymphadenopathy.  HEART:  Regular rate and rhythm, no murmurs.  LUNGS:  Clear bilaterally with decreased breath sounds.  ABDOMEN:  Soft, nontender, nondistended, bowel sounds are positive.  EXTREMITIES:  Show no cyanosis, clubbing or edema.  Pedal pulses are  positive.   BLOOD WORK:  Sodium 136, potassium 4.6, chloride 112, he is acidotic with a  bicarb of 16, glucose is 115, BUN 98, creatinine 3.9, calcium 8.5, PT is  45.4, INR is 9.1, PTT is 60.  White count is 9.3, hemoglobin 11.8,  hematocrit 34.7, MCV 83.4, platelets 254.   ASSESSMENT/PLAN:  This is an 77 year old white male who reports a two-day  history of bright red blood per  rectum.  In the emergency room he was found  to be hypotensive.  He is slightly anemic, however I doubt that his  hemoglobin has equalized just yet.  He is in acute renal insufficiency and  acidotic at the same time.  His INR is extremely elevated and his PTT as  well.   The plan is to resuscitate him with IV fluids.  He has been already given 10  units of vitamin K IV.  He is being typed and crossed for two units of fresh  frozen plasma which he is going to receive.  He is also being typed and  crossed for two units of blood.  In addition Dr. Dionicia Abler has been consulted.  He is in the intensive care unit currently.  He has received 40 mg of  Protonix IV.  INRs will be monitored and he is to receive oxygen via nasal  cannula.  All of his medications are going to be held and his hemoglobin,  PT/INR and PTT will be monitored.      SR/MEDQ  D:  08/31/2004  T:  08/31/2004  Job:  [id number]

## 2010-11-09 NOTE — Procedures (Signed)
[name], [name]             ACCOUNT NO.:  [id number]   MEDICAL RECORD NO.:  [id number]          PATIENT TYPE:  OUT   LOCATION:  RAD                           FACILITY:  [facility]   PHYSICIAN:  Olga Millers, M.D. LHCDATE OF BIRTH:  1921/09/18   DATE OF PROCEDURE:  01/15/2005  DATE OF DISCHARGE:                                  ECHOCARDIOGRAM   INDICATIONS FOR STUDY:  The patient is an 77 year old male who has had prior  mitral valve replacement.  The study is performed to follow up on mitral  valve gradients.   RESULTS:  The study is technically difficult.  The two-dimensional views are  as follows:  Left ventricular end-diastolic dimension is 4.3 cm.  The left  ventricular end-systolic dimension is 3.3 cm.  The aorta is 3.6 cm and the  left atrium is 6.6 cm.  The septum is 2.4 cm and posterior wall is 1.8 cm.   There is mild to moderate global reduction in LV function with an estimated  ejection fraction of 40%.  There is an oscillating density noted in the left  ventricle that is most likely residual chordal apparatus from his previous  mitral valve replacement.  There was moderate left ventricular hypertrophy  with proximal septal thickening.  There is moderate to severe left atrial  enlargement and moderate right atrial enlargement.  The right ventricle  appears to be normal in size and function.  There is no pericardial  effusion.   The aortic valve is not well-visualized, however, the gradient is increased  the aortic valve with a peak gradient of 17 mmHg, mean gradient of 10 mmHg  and an aortic valve area of 1.3 sq cm.  This is consistent with mild to  moderate aortic stenosis.  There is mild aortic insufficiency.  There is a  Carpentier-Edwards mitral valve replacement noted.  The mean gradient across  the mitral valve is 8.5 mmHg.  The pressure half-time is 220 with an area of  1.7 sq cm.  There is no significant mitral regurgitation.  There is mild  tricuspid  regurgitation with peak velocity of 3.1 msec suggestive of  moderately elevated pulmonary pressures.   IMPRESSION:  1.  Mild to moderate global reduction in left ventricular function.  2.  Oscillating density in the left ventricular cavity most likely residual      chordal apparatus.  3.  Moderate concentric left ventricular hypertrophy.  4.  Biatrial enlargement.  5.  Mild to moderate aortic stenosis.  6.  Mild aortic insufficiency.  7.  Status post Carpentier-Edwards mitral valve replacement.  8.  Mild tricuspid regurgitation with moderately elevated pulmonary      pressures.       BC/MEDQ  D:  01/15/2005  T:  01/15/2005  Job:  [id number]   cc:   Milus Mallick. Lodema Hong, M.D.  [address]  [place], [place] [address]  Fax: [phone]

## 2010-11-09 NOTE — Op Note (Signed)
NAME:  [name], [name]             ACCOUNT NO.:  [id number]   MEDICAL RECORD NO.:  [id number]          PATIENT TYPE:  AMB   LOCATION:  DAY                           FACILITY:  [facility]   PHYSICIAN:  R. Roetta Sessions, M.D. DATE OF BIRTH:  Nov 06, 1921   DATE OF PROCEDURE:  11/14/2004  DATE OF DISCHARGE:                                 OPERATIVE REPORT   PROCEDURES:  Esophagogastroduodenoscopy followed by colonoscopy and biopsy.   INDICATIONS FOR PROCEDURE:  The patient is an 77 year old gentleman with a  prosthetic mitral valve on Coumadin therapy with recurrent anemia and  Hemoccult-positive stools.  Back in March, EGD with colonoscopy demonstrated  small bulbar ulcers and erosions.  Colonoscopy demonstrated three  nonbleeding AVMs.  Prep was poor.  No active bleeding as stated above.  He  has been on Protonix 40 mg orally b.i.d.  He has continued to drop his  hemoglobin.  He is persistently heme positive.  His hemoglobin was in the 10  range four days ago.  He has remained stable.  He did not see any blood with  the prep yesterday.  EGD and colonoscopy are now being done to further  evaluate his bleeding.  The potential risks, benefits and alternatives have  been reviewed and questions answered.  He is agreeable.  Please see the  documentation in the medication record.   PROCEDURE NOTE:  O2 saturation, blood pressure, pulse and respirations were  monitored throughout the entire procedure.  Conscious sedation with Versed  2.5 mg IV and Demerol 40 mg IV in divided doses.  SBE prophylaxis with  ampicillin 2 g IV and gentamicin 100 mg IV prior to the procedure.  His  hemoglobin and hematocrit today are 9.8 and 30.0, respectively.  Pro time is  pending.  He stopped his Coumadin on Nov 08, 2004.   ESOPHAGOGASTRODUODENOSCOPY FINDINGS:  Examination of the tubular esophagus  revealed no mucosal abnormalities.  The EG junction was easily traversed on  entering the stomach.  The gastric cavity was  emptied and insufflated well  with air.  Throughout examination of the gastric mucosa, including  retroflexed view of the proximal stomach and esophagogastric junction  demonstrated only a small hiatal hernia.  Pylorus patent and easily  traversed.  Examination of the bulb and second portion revealed no  abnormalities.  The previously noted erosions and ulcers were no longer  present.   THERAPIES/DIAGNOSTIC MANEUVERS PERFORMED:  None.   The patient tolerated the procedure well and was prepared for colonoscopy.  Digital rectal exam revealed no abnormalities.   ENDOSCOPIC FINDINGS:  Prep was good.   Rectum:  Examination of the rectal mucosa, including retroflexed view of the  anal verge revealed only some internal hemorrhoids.   Colon:  The colonic mucosa was surveyed from the rectosigmoid junction  through the left transverse right colon to the appendiceal orifice,  ileocecal valve and cecum.  These structures were well seen and photographed  for the record.  From this level, the scope was slowly withdrawn.  All  previously mentioned mucosal surfaces were again seen.  The patient was  noted to  have:  1.  Sigmoid diverticula.  2.  Diffuse oozing of the right  colon mucosa.  No diffuse bleeding AVM was seen, however, the entire right  colon mucosa was blanketed by submucosal petechial hemorrhages and slow  oozing.  Please see multiple photos taken.  I attempted briefly to intubate  the terminal ileum, but was not successful.  There was a diminutive 4 mm  polyp in the right colon and one at the hepatic flexure.  Both were cold  biopsied/removed.  I carefully inspected the right colon mucosa and washed  the lining with water and observed it to diffusely ooze very slowly.  I did  not see any obvious erosions, ulcerations or granularity.  Biopsies of the  right colon mucosa were taken for histologic study in addition to cold  biopsy removal of the polyps.  The remainder of the colonic  mucosa appeared  normal.  The patient tolerated both procedures well and was reacted at  endoscopy.   IMPRESSION:  1.  Esophagogastroduodenoscopy findings:  Normal esophagus and small hiatal      hernia, otherwise normal stomach and normal D1 and D2.  2.  Colonoscopy findings:  Internal hemorrhoids, otherwise normal rectum,      left-sided diverticula, diffusely oozing right colon mucosa as described      above, no discrete lesion amenable to endoscopic therapy was found, two      diminutive polyps, status post cold biopsy/removal, status post mucosal      biopsy of right colon.   DISCUSSION:  This presents a difficult clinical scenario.  We do not know  what his pro time is today, but that laboratory result is pending.   He way well has vascular ectasia in the right colon diffusely contributing  to the picture of GI bleeding.  At any rate, anticoagulation is going to be  a challenge.   It is possible he may have microscopic colitis predisposing him to bleeding  in a setting of Coumadin therapy.  If this were the case, that would be  amenable to medical therapy with anti-inflammatory agents, but for the time  being I must recommend that he not restart Coumadin.  Will follow up on his  pro time.  At this point, I feel that he can remain an outpatient.  He will  need his hemoglobin checked again at a very short interval to be determined.      RMR/MEDQ  D:  11/14/2004  T:  11/14/2004  Job:  [id number]   cc:   Milus Mallick. Lodema Hong, M.D.  [address]  [place], [place] [address]  Fax: [phone]   [facility]

## 2010-11-10 NOTE — Group Therapy Note (Signed)
  [name], [name]             ACCOUNT NO.:  [id number]  MEDICAL RECORD NO.:  [id number]           PATIENT TYPE:  I  LOCATION:  IC03                          FACILITY:  [facility]  PHYSICIAN:  Joniya Boberg L. Juanetta Gosling, M.D.DATE OF BIRTH:  29-Jul-1921  DATE OF PROCEDURE: DATE OF DISCHARGE:                                PROGRESS NOTE   [name] has remained off the ventilator, seems to be doing exceptionally well and has no new complaints.  PHYSICAL EXAMINATION:  GENERAL:  He is awake and alert.  He looks comfortable.  He is sitting up and talking.  He does not appear dyspneic at all. VITAL SIGNS:  His blood pressure 144/84, pulse is in the 80s, O2 sats in the 90s on nasal O2. CHEST:  Much clearer. HEART:  Regular with occasional extrasystole. ABDOMEN:  Soft.  ASSESSMENT:  He is much improved.  PLAN:  I am going to plan to sign off since he has had such remarkable improvement with his respiratory situation, but I would be glad to see him at anytime at your request.  Thanks for allowing me to see him with you.     Jiro Kiester L. Juanetta Gosling, M.D.     ELH/MEDQ  D:  11/02/2010  T:  11/02/2010  Job:  [id number]  Electronically Signed by Kari Baars M.D. on 11/10/2010 09:37:39 AM

## 2010-11-10 NOTE — Group Therapy Note (Signed)
  NAMEAZARIEL, BANIK             ACCOUNT NO.:  [id number]  MEDICAL RECORD NO.:  [id number]           PATIENT TYPE:  I  LOCATION:  IC03                          FACILITY:  [facility]  PHYSICIAN:  Emmalise Huard L. Juanetta Gosling, M.D.DATE OF BIRTH:  [birth year]  DATE OF PROCEDURE: DATE OF DISCHARGE:                                PROGRESS NOTE   Mr. Borba was able to be extubated yesterday and he has done very well.  He was sitting up yesterday.  He feels better.  He says that he is not having any new problems.  His physical examination shows that he is awake and alert, looks very comfortable, pulses in the 70s, blood pressure about 120 systolic.  His chest is much clear, although he still has some rhonchi.  His heart is mostly regular now.  His abdomen is soft.  He still has some PVCs.  His lab work, magnesium is 2.3.  BMET shows CO2 is 38 suggesting chronic CO2 retention, glucose 124, BUN 26, creatinine 0.71, and his white count is 12,800, hemoglobin 14.7, platelets 140,000.  ASSESSMENT:  He is much improved.  PLAN:  His diet is being advanced.  He overall seems to be much better and my plan will be to follow but more peripherally now.  He seems to have improved remarkably at this point.     Rukaya Kleinschmidt L. Juanetta Gosling, M.D.     ELH/MEDQ  D:  11/01/2010  T:  11/01/2010  Job:  [id number]  Electronically Signed by Kari Baars M.D. on 11/10/2010 09:37:37 AM

## 2010-11-12 ENCOUNTER — Ambulatory Visit: Payer: Medicare Other | Admitting: Family Medicine

## 2010-11-16 ENCOUNTER — Other Ambulatory Visit: Payer: Self-pay | Admitting: Family Medicine

## 2010-11-20 ENCOUNTER — Ambulatory Visit (INDEPENDENT_AMBULATORY_CARE_PROVIDER_SITE_OTHER): Payer: Medicare Other | Admitting: Family Medicine

## 2010-11-20 ENCOUNTER — Encounter: Payer: Self-pay | Admitting: Family Medicine

## 2010-11-20 VITALS — BP 100/64 | HR 72 | Resp 16 | Wt 221.0 lb

## 2010-11-20 DIAGNOSIS — I5030 Unspecified diastolic (congestive) heart failure: Secondary | ICD-10-CM | POA: Insufficient documentation

## 2010-11-20 DIAGNOSIS — I10 Essential (primary) hypertension: Secondary | ICD-10-CM

## 2010-11-20 DIAGNOSIS — J449 Chronic obstructive pulmonary disease, unspecified: Secondary | ICD-10-CM

## 2010-11-20 DIAGNOSIS — E785 Hyperlipidemia, unspecified: Secondary | ICD-10-CM

## 2010-11-20 DIAGNOSIS — L8990 Pressure ulcer of unspecified site, unspecified stage: Secondary | ICD-10-CM | POA: Insufficient documentation

## 2010-11-20 NOTE — Progress Notes (Signed)
  Subjective:    Patient ID: [name], male    DOB: 1921/10/20, 77 y.o.   MRN: [id number]  HPI Pt hospitalised from May1 to May May 12 for sepsis due to community acquired pneumonia, acute respiratory failure requiring intubation and COPD exacerbation, acute diastolic heart failure , and acute renal failure.Marland Kitchen He was discharged to the nursing home for rehabilitation, and was there till May 25,2012,  Over the weekend the [facility] nurse called stating he was symptomatic and his blood pressure was low, advised continue the benazepril 10mg  daily (breaks 20mg  tab in half) I advised  Reduce metoprolol to half twice daily, reports improvement in lightheadedness and weakness Concerned about ulcers on his left foot states the h/h nurse is unaware, also h/o sore between buttocks which he had for several weeks, needs this to be evaluated also. Apetite is good, no problems with bowel movements, reportedly needs to be more consistent with home PT to improve strength, no falls     Review of Systems See HPI. Denies current  fever or chills. Denies sinus pressure, nasal congestion, ear pain or sore throat. Still has some cough and wheeze however long h/o nicotine use which he recently quit. Denies chest pains, palpitations, paroxysmal nocturnal dyspnea, orthopnea and reports left lower  leg swelling Denies abdominal pain, nausea, vomiting,diarrhea or constipation.  Denies rectal bleeding or change in bowel movement. Chronic joint pain, and limitation in mobility, espescially the back and knees Denies headaches, seizure, numbness, or tingling. Denies depression, anxiety or insomnia.         Objective:   Physical Exam Patient alert  and in no Cardiopulmonary distress.HEENT: No facial asymmetry, EOMI, no sinus tenderness, TM's clear, Oropharynx pink and moist.  Neck supple no adenopathy.Pt on supplemental oxygen which he needs chronically  Chest: decreased  air entry throughout scattered wheezes, no  crackles  CVS: S1, S2 murmurs, no S3.No JVD   ABD: Soft non tender. Bowel sounds normal.  Ext: No edema  MS: decreased  ROM spine, shoulders, hips and knees.  Skin: pressure sores with no ulceration noted on left lower leg, no drainage noted. Ulcer with sloughing of the skin between buttock, max diameter approx 2.5cm  Psych: Good eye contact, normal affect. Memoryloss not anxious or depressed appearing.  CNS: CN 2-12 intact,       Assessment & Plan:

## 2010-11-20 NOTE — Assessment & Plan Note (Signed)
Controlled, no change in medication  

## 2010-11-20 NOTE — Assessment & Plan Note (Signed)
Recently hospitalized with acute diastolic heart failure, pt to continue fluid restriction and current meds, cardiology f/u scheduled for am

## 2010-11-20 NOTE — Assessment & Plan Note (Signed)
Improved from recent acute exacerbation , however oxygen dependent, pt has quit smoking also which is good

## 2010-11-20 NOTE — Patient Instructions (Addendum)
F/u in 2 months.  I am happy that you are recovering from your recent hospitalization, please follow the fluid restrictions and take the medication as prescribed.  Baby asprin one daily please I am happy that you have stopped smoking  Pls keep the ulcers on your left leg loosly covered with dry gauze for protection , till all healed.  Pls do your home exercises to get stronger  I will let home health nurse know about the ulcer between your buttocks, this will need to be cared till healed

## 2010-11-20 NOTE — Assessment & Plan Note (Signed)
No sign of infection on lower ext dry gauze applied for protection.  Ulcer between gluteat folds needs tagaderm applied every 3 days , and nursing supervision at home till healed, message to be sen to nurse at advanced

## 2010-11-22 ENCOUNTER — Telehealth: Payer: Self-pay | Admitting: Family Medicine

## 2010-11-22 ENCOUNTER — Encounter: Payer: Medicare Other | Admitting: Adult Health

## 2010-11-22 MED ORDER — LANTISEPTIC SKIN PROTECTANT 50 % EX OINT: TOPICAL_OINTMENT | CUTANEOUS | Status: DC

## 2010-11-22 NOTE — Telephone Encounter (Signed)
She said she saw him yesterday and he was complaining with left ankle pain and his foot and ankle was swollen and a few sores were there and were a little red up to his leg a little ways. Daughter said he could not wear his shoes and HH nurse recommended slippers to wear and she said that daughter stated it was worse today but she only saw him yesterday so it probably looked the same as when you saw it.

## 2010-11-22 NOTE — Telephone Encounter (Signed)
Let h/h nurse and daughter know if worse next week, will arrange for him to see wound doc re the leg, pls order the oxygen top continue as he was discharged on from the company they wish

## 2010-11-22 NOTE — Telephone Encounter (Signed)
Needs order sent to [facility] for liquid oxygen, fax [phone]

## 2010-11-22 NOTE — Telephone Encounter (Signed)
pls send in the lantiseptic, get directions from the H/h nurse as to how to use , also document her concern re the ankles

## 2010-11-22 NOTE — Progress Notes (Signed)
Cancelled.  

## 2010-11-23 ENCOUNTER — Other Ambulatory Visit: Payer: Self-pay | Admitting: Family Medicine

## 2010-11-23 NOTE — Telephone Encounter (Signed)
Home health nurse aware, called daughter, no answer

## 2010-11-27 ENCOUNTER — Ambulatory Visit: Payer: Medicare Other | Admitting: Family Medicine

## 2010-11-27 ENCOUNTER — Telehealth: Payer: Self-pay | Admitting: Family Medicine

## 2010-11-28 ENCOUNTER — Other Ambulatory Visit (INDEPENDENT_AMBULATORY_CARE_PROVIDER_SITE_OTHER): Payer: Medicare Other | Admitting: Family Medicine

## 2010-11-28 DIAGNOSIS — L0390 Cellulitis, unspecified: Secondary | ICD-10-CM

## 2010-11-28 DIAGNOSIS — L0291 Cutaneous abscess, unspecified: Secondary | ICD-10-CM

## 2010-11-28 MED ORDER — SULFAMETHOXAZOLE-TRIMETHOPRIM 400-80 MG PO TABS: 1.0000 | ORAL_TABLET | Freq: Two times a day (BID) | ORAL | Status: AC

## 2010-11-28 NOTE — Telephone Encounter (Signed)
Pt needs appt at  wound center in [place] asap, I will also send in an antibiotic for him to start on , 1 week duration, i am entering an urgent referral, pls ler her know to call front desk, or put her to the front desk about this Ensure she understands to start the antibiotic He needs to keep the leg elevated as much a s possible to help reduce the swelling

## 2010-11-28 NOTE — Telephone Encounter (Signed)
CALLED NURSE, LEFT MESSAGE

## 2010-11-28 NOTE — Telephone Encounter (Signed)
NURSE AWARE AND STATES SHE WILL ADVISE PATIENTS DAUGHTER

## 2010-11-30 DIAGNOSIS — J441 Chronic obstructive pulmonary disease with (acute) exacerbation: Secondary | ICD-10-CM

## 2010-11-30 DIAGNOSIS — I214 Non-ST elevation (NSTEMI) myocardial infarction: Secondary | ICD-10-CM

## 2010-11-30 DIAGNOSIS — I5031 Acute diastolic (congestive) heart failure: Secondary | ICD-10-CM

## 2010-11-30 DIAGNOSIS — J9620 Acute and chronic respiratory failure, unspecified whether with hypoxia or hypercapnia: Secondary | ICD-10-CM

## 2010-12-03 ENCOUNTER — Other Ambulatory Visit: Payer: Self-pay | Admitting: Family Medicine

## 2010-12-04 ENCOUNTER — Encounter: Payer: Self-pay | Admitting: Adult Health

## 2010-12-04 ENCOUNTER — Encounter (INDEPENDENT_AMBULATORY_CARE_PROVIDER_SITE_OTHER): Payer: Medicare Other

## 2010-12-04 ENCOUNTER — Ambulatory Visit (INDEPENDENT_AMBULATORY_CARE_PROVIDER_SITE_OTHER): Payer: Medicare Other | Admitting: Adult Health

## 2010-12-04 ENCOUNTER — Encounter (INDEPENDENT_AMBULATORY_CARE_PROVIDER_SITE_OTHER): Payer: Medicare Other | Admitting: Vascular Surgery

## 2010-12-04 DIAGNOSIS — I739 Peripheral vascular disease, unspecified: Secondary | ICD-10-CM

## 2010-12-04 DIAGNOSIS — I10 Essential (primary) hypertension: Secondary | ICD-10-CM

## 2010-12-04 DIAGNOSIS — I4891 Unspecified atrial fibrillation: Secondary | ICD-10-CM

## 2010-12-04 NOTE — Patient Instructions (Signed)
Your physician recommends that you continue on your current medications as directed. Please refer to the Current Medication list given to you today.  Your physician recommends that you schedule a follow-up appointment in: after hospitalization

## 2010-12-04 NOTE — Consult Note (Signed)
NEW PATIENT CONSULTATION  [name], [name] DOB:  April 21, 1922                                       12/04/2010 ZOXWR#:[id number]  Patient is an 77 year old male patient with multiple severe medical problems, including COPD with a recent hospitalization for pneumonia which included sepsis.  This was in early May of this year.  Since then, he has been having rest pain in the left foot.  He does not ambulate. He has not ambulated in 4-6 weeks.  He has had no history of nonhealing ulcers until the last few weeks where he developed some sores in the lower third of the leg.  He was seen by Dr. Tanda Rockers in the wound center, who referred him here for vascular evaluation.  He had a lower extremity arterial Doppler study performed in our office today, which I have reviewed and interpreted.  His ABI on the right leg is 0.98 with triphasic flow, and in the left leg he has absent flow in the left foot.  CHRONIC MEDICAL PROBLEMS: 1. COPD, on home oxygen. 2. History of pneumonia with sepsis. 3. Thrombocytopenia in the past. 4. History of atrial fibrillation, not on anticoagulants. 5. Coronary artery disease, previous PTCA and stenting. 6. Hypertension. 7. History of mitral valve replacement with a porcine valve by Dr.     Cornelius Moras in 2003. 8. Hyperlipidemia. 9. History of prostate cancer.  SOCIAL HISTORY:  Widowed, has 3 children.  Is retired.  Smoked 2 packs of cigarettes per day for 40+ years.  Has been intermittently off of cigarettes over the past 15 years.  FAMILY HISTORY:  Negative for coronary artery disease, diabetes, or stroke.  REVIEW OF SYSTEMS:  Positive for leg discomfort, home oxygen, arthritis, joint pain, muscle pain.  All other systems are negative in complete review of systems.  PHYSICAL EXAMINATION:  Blood pressure 132/93, heart rate 66, respirations 14.  Generally, he is a chronically ill-appearing elderly male in no apparent distress, alert and x3.   HEENT:  Normal for age. EOMs intact.  Lungs:  Clear to auscultation except for some expiratory wheezing.  Cardiovascular:  An irregularly irregular rhythm.  No murmurs.  Carotid pulses are 3+.  No bruits.  Abdomen:  Obese.  No palpable masses.  Musculoskeletal:  Free of major deformities. Neurologic:  Reveals decreased sensation in the left foot.  Skin exam reveals dependent rubor on the left foot from the ankle distally.  He does have motion and some sensation in the left foot.  There are some punctate ischemic ulcers in the lower third of the left leg, each measuring about 1 cm in diameter medially and posteriorly.  No cellulitis.  He does have 2+ femoral pulses bilaterally.  The right leg has 2+ popliteal and 1+ posterior tib.  The left leg has absent pulses below the femoral.  IMPRESSION:  Ischemic, left leg, which is quite severe with rest pain and dependent rubor.  No flow in the left foot.  PLAN:  Angiogram tomorrow by Dr. [name] Gianotti to see if any intervention is feasible and will then have to determine whether bypass grafting can be performed for limb salvage, otherwise an above-knee amputation will be needed.    Quita Skye Hart Rochester, M.D. Electronically Signed  JDL/MEDQ  D:  12/04/2010  T:  12/04/2010  Job:  [id number]  cc:   Jake Shark A. Tanda Rockers, M.D. [name]. Rothbart,  MD, [facility] Lodema Hong, M.D.

## 2010-12-04 NOTE — Assessment & Plan Note (Signed)
Mildly low BP on this assessment.  However, will not discontinue or change antihypertensive meds at this time.  He is to be admitted in am to [facility] and this will be managed by [facility] physicians.  I have reviewed this with Dr. Dietrich Pates.

## 2010-12-04 NOTE — Progress Notes (Signed)
HPI:  [name] is a 77 y/o CM patient of Dr. Dietrich Pates, with multiple medical problems to include COPD, PAD, CAD, diastolic CHF, mitral valve (bioprosthetic) replacement, atrial fib, dyslipidemia who we are seeing on follow-up after hospitalization for VDRF, sepsis.  We saw him on consultation for atrial fibrillation and elevated troponin in this setting.  He was not found to be a coumadin candidate or in need of invasive studies and is to be treated medically.  Since that time he has developed severe PAD requiring possible AKA of his left leg.  He has multiple non-healing sores and pain in left leg. He is scheduled for arteriogram of the left leg per Dr. Cay Schillings with possible stent vs amputation on 12/05/2010.  Other than pain in left lower extremity, he denies symptoms. He is not active at all, sitting or laying in bed the majority of the time.    Allergies  Allergen Reactions  . Aspirin     Current Outpatient Prescriptions  Medication Sig Dispense Refill  . allopurinol (ZYLOPRIM) 300 MG tablet take 1 tablet by mouth once daily  30 tablet  1  . aspirin 81 MG tablet Take 81 mg by mouth daily.        . benazepril (LOTENSIN) 20 MG tablet        . Benzonatate (TESSALON PERLES PO) Take 1 tablet by mouth 3 (three) times daily.        . furosemide (LASIX) 20 MG tablet Take 20 mg by mouth daily.        Marland Kitchen ipratropium (ATROVENT) 0.02 % nebulizer solution Take 500 mcg by nebulization every 6 (six) hours.        Marland Kitchen levalbuterol (XOPENEX) 1.25 MG/0.5ML nebulizer solution Take 1 ampule by nebulization every 6 (six) hours as needed.        . lovastatin (MEVACOR) 40 MG tablet Take 40 mg by mouth at bedtime.        . metoprolol (LOPRESSOR) 50 MG tablet One half tab daily      . multivitamin (THERAGRAN) per tablet Take 1 tablet by mouth daily.        Marland Kitchen omeprazole (PRILOSEC) 40 MG capsule take 1 capsule by mouth once daily 1 capsule by mouth once daily  30 capsule  1  . potassium chloride SA (KLOR-CON M20) 20 MEQ  tablet Take 20 mEq by mouth daily.        . Skin Protectants, Misc. [facility] SKIN PROTECTANT) 50 % OINT Apply to affected area as needed  400 g  1  . sulfamethoxazole-trimethoprim (SEPTRA) 400-80 MG per tablet Take 1 tablet by mouth 2 (two) times daily.  14 tablet  0  . traMADol (ULTRAM) 50 MG tablet take 1 tablet by mouth twice a day  40 tablet  2  . DISCONTD: benazepril (LOTENSIN) 20 MG tablet TAKE ONE TABLET TWICE A DAY  60 tablet  1  . DISCONTD: benazepril (LOTENSIN) 40 MG tablet Take 40 mg by mouth daily.          Past Medical History  Diagnosis Date  . Atrial fibrillation   . Valvular heart disease   . Obesity   . Osteoarthritis   . Hypertension   . Hyperlipidemia   . COPD (chronic obstructive pulmonary disease)   . CHF (congestive heart failure) diastolic 10/2010  . Pneumonia   . Cancer prostate     Past Surgical History  Procedure Date  . Left knee   . Ulcer surgery   . Bladder surgery   .  Mitral valve replacement 2003  . Coronary artery bypass graft   . Rca with stent replacement      1994  . Right eye surgery      2007    UJW:JXBJYN of systems complete and found to be negative unless listed above PHYSICAL EXAM BP 92/58  Pulse 76  Ht 6' (1.829 m)  Wt 214 lb (97.07 kg)  BMI 29.02 kg/m2  SpO2 91% General: Well developed, well nourished,, complains of pain in left leg. Head: Eyes PERRLA, Pos. xanthomas.   Normal cephalic and atramatic  Lungs: Clear bilaterally to auscultation and percussion. Heart: IRRR S1 S2, distant heart sounds.  Absent pulses bilateral LE            No carotid bruit. No JVD.  No abdominal bruits.  Abdomen: Bowel sounds are positive, abdomen soft and non-tender without masses or                  Hernia's noted. Msk:  Sitting in wheelchair, left lower extremity red with mild edema, multiple sores on heel, calf and shin, no draining seen. Extremities: Bilateral absent pulses.  Right leg is warm with healed sores, Left leg as  above. Neuro: Alert and oriented X 3. Psych:   Flat affect, responds appropriately   ASSESSMENT AND PLAN

## 2010-12-04 NOTE — Assessment & Plan Note (Signed)
Heart rate is well controlled currently.  He is not a coumadin candidate.  He will continue ASA only.

## 2010-12-05 ENCOUNTER — Telehealth: Payer: Self-pay | Admitting: Family Medicine

## 2010-12-05 ENCOUNTER — Inpatient Hospital Stay (HOSPITAL_COMMUNITY)
Admission: RE | Admit: 2010-12-05 | Discharge: 2010-12-07 | DRG: 253 | Disposition: A | Discharge status: Home / Self Care | Payer: Medicare Other | Source: Ambulatory Visit | Attending: Surgery | Admitting: Surgery

## 2010-12-05 DIAGNOSIS — I7092 Chronic total occlusion of artery of the extremities: Secondary | ICD-10-CM | POA: Diagnosis present

## 2010-12-05 DIAGNOSIS — I714 Abdominal aortic aneurysm, without rupture, unspecified: Secondary | ICD-10-CM | POA: Diagnosis present

## 2010-12-05 DIAGNOSIS — I70219 Atherosclerosis of native arteries of extremities with intermittent claudication, unspecified extremity: Principal | ICD-10-CM | POA: Diagnosis present

## 2010-12-05 DIAGNOSIS — N179 Acute kidney failure, unspecified: Secondary | ICD-10-CM | POA: Diagnosis present

## 2010-12-05 LAB — BASIC METABOLIC PANEL
CO2: 28 mEq/L (ref 19–32)
Chloride: 103 mEq/L (ref 96–112)
Creatinine, Ser: 1.99 mg/dL — ABNORMAL HIGH (ref 0.4–1.5)
GFR calc Af Amer: 39 mL/min — ABNORMAL LOW (ref >60)
Potassium: 6.8 mEq/L (ref 3.5–5.1)
Sodium: 138 mEq/L (ref 135–145)

## 2010-12-05 LAB — POCT I-STAT, CHEM 8
Chloride: 109 mEq/L (ref 96–112)
Glucose, Bld: 112 mg/dL — ABNORMAL HIGH (ref 70–99)
HCT: 47 % (ref 39.0–52.0)
Hemoglobin: 16 g/dL (ref 13.0–17.0)
Potassium: 5.6 mEq/L — ABNORMAL HIGH (ref 3.5–5.1)
Sodium: 137 mEq/L (ref 135–145)

## 2010-12-05 LAB — POTASSIUM: Potassium: 5.3 mEq/L — ABNORMAL HIGH (ref 3.5–5.1)

## 2010-12-05 NOTE — Telephone Encounter (Signed)
Advise we last saw it 5/14 it was normal, he was maintained on the meds he was discharged on, nothing new was added. Of note the first potassium level today was not as high as the rept , later one, I cannot explain this,(not sure there was hemolysis), ANYHOW, once back to normal he is to have chem 7 once weekly x 2 weks on his meds then after  And additional 2 weeks to ensure within n. Let her know, order the tests and explain it is impt he gets them done PLS!

## 2010-12-05 NOTE — Telephone Encounter (Signed)
I see that labs from 5/9 for K+ that were critically low, them a few days later it was back to normal. Looks like they were done in the hospital. I don't see where we have given him any potassium from here. What do I need to tell her regarding this

## 2010-12-05 NOTE — Telephone Encounter (Signed)
Called [name] no answer

## 2010-12-06 DIAGNOSIS — I70219 Atherosclerosis of native arteries of extremities with intermittent claudication, unspecified extremity: Secondary | ICD-10-CM

## 2010-12-06 HISTORY — PX: FEMORAL ARTERY STENT: SHX1583

## 2010-12-06 LAB — BASIC METABOLIC PANEL
Chloride: 106 mEq/L (ref 96–112)
GFR calc Af Amer: >60 mL/min (ref >60)
GFR calc non Af Amer: 53 mL/min — ABNORMAL LOW (ref >60)
Glucose, Bld: 100 mg/dL — ABNORMAL HIGH (ref 70–99)
Potassium: 4.7 mEq/L (ref 3.5–5.1)
Sodium: 141 mEq/L (ref 135–145)

## 2010-12-06 LAB — CBC
Hemoglobin: 13.4 g/dL (ref 13.0–17.0)
MCHC: 32.9 g/dL (ref 30.0–36.0)
WBC: 7.5 10*3/uL (ref 4.0–10.5)

## 2010-12-06 LAB — HEMOGLOBIN AND HEMATOCRIT, BLOOD
HCT: 39.3 % (ref 39.0–52.0)
Hemoglobin: 13.1 g/dL (ref 13.0–17.0)

## 2010-12-06 NOTE — Telephone Encounter (Signed)
Called [name], no answer

## 2010-12-07 LAB — CBC
HCT: 38.1 % — ABNORMAL LOW (ref 39.0–52.0)
MCHC: 33.6 g/dL (ref 30.0–36.0)
RDW: 14 % (ref 11.5–15.5)

## 2010-12-07 LAB — BASIC METABOLIC PANEL
BUN: 19 mg/dL (ref 6–23)
GFR calc Af Amer: >60 mL/min (ref >60)
GFR calc non Af Amer: >60 mL/min (ref >60)
Potassium: 4.4 mEq/L (ref 3.5–5.1)

## 2010-12-07 NOTE — Telephone Encounter (Signed)
Called again, no answer

## 2010-12-07 NOTE — Telephone Encounter (Signed)
noted 

## 2010-12-10 ENCOUNTER — Other Ambulatory Visit: Payer: Self-pay | Admitting: Family Medicine

## 2010-12-10 ENCOUNTER — Other Ambulatory Visit: Payer: Self-pay | Admitting: Vascular Surgery

## 2010-12-10 ENCOUNTER — Telehealth: Payer: Self-pay | Admitting: Family Medicine

## 2010-12-10 DIAGNOSIS — I714 Abdominal aortic aneurysm, without rupture: Secondary | ICD-10-CM

## 2010-12-10 DIAGNOSIS — E875 Hyperkalemia: Secondary | ICD-10-CM

## 2010-12-10 NOTE — Telephone Encounter (Signed)
[name] states patient needs appointment with kidney specialist, patients potassium was really high, hospital took him off some of his meds also.

## 2010-12-10 NOTE — Telephone Encounter (Signed)
pls schedule an appt with dr Alferd Apa per pt request to evaluate kidneys in light of recent high potassium, let her know when you have sent the request pls, order in referral box

## 2010-12-12 NOTE — Telephone Encounter (Signed)
The doctors office will call patient with appointment

## 2010-12-13 ENCOUNTER — Telehealth: Payer: Self-pay | Admitting: *Deleted

## 2010-12-17 ENCOUNTER — Telehealth: Payer: Self-pay | Admitting: *Deleted

## 2010-12-17 NOTE — Telephone Encounter (Signed)
Message copied by Augusto Gamble on Mon Dec 17, 2010  3:01 PM ------      Message from: Kathlen Brunswick      Created: Thu Dec 13, 2010 11:25 AM       Mr. Schlabach does not appear to be scheduled for followup appointment.  Please have him see me in the first available appointment and at his convenience.

## 2010-12-17 NOTE — Telephone Encounter (Signed)
01/09/2011 @3 :00pm

## 2010-12-17 NOTE — Telephone Encounter (Signed)
Message copied by Augusto Gamble on Mon Dec 17, 2010  2:21 PM ------      Message from: Kathlen Brunswick      Created: Thu Dec 13, 2010 11:25 AM       Mr. Casamento does not appear to be scheduled for followup appointment.  Please have him see me in the first available appointment and at his convenience.

## 2010-12-18 ENCOUNTER — Other Ambulatory Visit: Payer: Self-pay

## 2010-12-18 MED ORDER — FLUTICASONE-SALMETEROL 100-50 MCG/DOSE IN AEPB: INHALATION_SPRAY | RESPIRATORY_TRACT | Status: DC

## 2010-12-18 NOTE — Telephone Encounter (Signed)
Has an appt with kidney doc already that will be following it

## 2010-12-20 ENCOUNTER — Telehealth: Payer: Self-pay | Admitting: Family Medicine

## 2010-12-20 NOTE — Telephone Encounter (Signed)
If he has not been taking any since he has been home and improved sincwe the stent , do not refill, my understanding yesterday was that he was not on the lasix, just verify this with her, if he has no leg swelling, has not been on lasix and is doing well, plsdo nOT refill

## 2010-12-20 NOTE — Telephone Encounter (Signed)
States she called in medication to [facility] she said that her father is on laxis and was wondering if Dr Lodema Hong was the doctor to call this in.  The doctor that was taking care of him at the nursing home won't return the call to the pharmacy to give authorization.  Please advise.

## 2010-12-20 NOTE — Telephone Encounter (Signed)
Lasix 20mg  1 daily is on medlist. Is this the correct dose still. Ok to fill?

## 2010-12-21 ENCOUNTER — Other Ambulatory Visit (INDEPENDENT_AMBULATORY_CARE_PROVIDER_SITE_OTHER): Payer: Medicare Other | Admitting: Family Medicine

## 2010-12-21 ENCOUNTER — Other Ambulatory Visit: Payer: Self-pay | Admitting: Family Medicine

## 2010-12-21 DIAGNOSIS — M7989 Other specified soft tissue disorders: Secondary | ICD-10-CM

## 2010-12-21 MED ORDER — FUROSEMIDE 20 MG PO TABS: ORAL_TABLET | ORAL | Status: DC

## 2010-12-21 NOTE — Telephone Encounter (Signed)
She said they stopped the tramadol, potassium and benazepril but kept him on the lasix and she has not been able to get the dr to respond to the refill request. [name] states his feet are still swelling some but she said its not bad. Do you want him to take it as needed or stop it?

## 2010-12-21 NOTE — Telephone Encounter (Signed)
[name] aware  

## 2010-12-21 NOTE — Progress Notes (Signed)
Will discontinue the lasix ordered, need md eval, recently had hyperkalemia

## 2010-12-21 NOTE — Telephone Encounter (Signed)
pls ler daughter know since he was so sick , multiple probs , heart failure, kidney failure , high potassium, I will not refill the lasix since the swelling is slight only, the kidney doc will determine if he needs this when he sees Mr butcher

## 2010-12-24 ENCOUNTER — Ambulatory Visit: Payer: Medicare Other | Admitting: Surgery

## 2010-12-24 ENCOUNTER — Ambulatory Visit
Admission: RE | Admit: 2010-12-24 | Discharge: 2010-12-24 | Disposition: A | Discharge status: Home / Self Care | Payer: Medicare Other | Source: Ambulatory Visit | Attending: Vascular Surgery | Admitting: Vascular Surgery

## 2010-12-24 DIAGNOSIS — I714 Abdominal aortic aneurysm, without rupture: Secondary | ICD-10-CM

## 2010-12-24 MED ORDER — IOHEXOL 350 MG/ML SOLN
100.0000 mL | Freq: Once | INTRAVENOUS | Status: AC | PRN
  Administered 2010-12-24: 125 mL via INTRAVENOUS

## 2010-12-25 ENCOUNTER — Ambulatory Visit: Payer: Medicare Other | Admitting: Vascular Surgery

## 2010-12-25 ENCOUNTER — Telehealth: Payer: Self-pay | Admitting: Family Medicine

## 2010-12-25 ENCOUNTER — Other Ambulatory Visit: Payer: Medicare Other

## 2010-12-25 DIAGNOSIS — I10 Essential (primary) hypertension: Secondary | ICD-10-CM

## 2010-12-25 NOTE — Telephone Encounter (Signed)
pls order chem 7 can get it today or Thursday MORNING

## 2010-12-25 NOTE — Telephone Encounter (Signed)
Can we order a potassium on this patient

## 2010-12-25 NOTE — Telephone Encounter (Signed)
Test ordered, called daughter, left message

## 2010-12-27 LAB — BASIC METABOLIC PANEL
Calcium: 9.2 mg/dL (ref 8.4–10.5)
Chloride: 105 mEq/L (ref 96–112)
Creat: 0.88 mg/dL (ref 0.50–1.35)

## 2010-12-27 NOTE — Telephone Encounter (Signed)
CALLED PATIENT, LEFT MESSAGE.  

## 2010-12-28 ENCOUNTER — Other Ambulatory Visit (HOSPITAL_COMMUNITY): Payer: Self-pay | Admitting: Oncology

## 2010-12-28 ENCOUNTER — Encounter (HOSPITAL_COMMUNITY): Payer: Medicare Other | Attending: Oncology | Admitting: Oncology

## 2010-12-28 DIAGNOSIS — C61 Malignant neoplasm of prostate: Secondary | ICD-10-CM | POA: Insufficient documentation

## 2010-12-28 DIAGNOSIS — D509 Iron deficiency anemia, unspecified: Secondary | ICD-10-CM

## 2010-12-28 LAB — CBC
Hemoglobin: 13.5 g/dL (ref 13.0–17.0)
RBC: 4.29 MIL/uL (ref 4.22–5.81)

## 2010-12-28 NOTE — Telephone Encounter (Signed)
Blood work completed.

## 2010-12-28 NOTE — Progress Notes (Signed)
Does not require this med at this time

## 2010-12-29 LAB — IRON AND TIBC
Iron: 118 ug/dL (ref 42–135)
Saturation Ratios: 42 % (ref 20–55)
TIBC: 278 ug/dL (ref 215–435)
UIBC: 160 ug/dL

## 2010-12-29 LAB — FERRITIN: Ferritin: 162 ng/mL (ref 22–322)

## 2010-12-31 ENCOUNTER — Other Ambulatory Visit: Payer: Self-pay | Admitting: Surgery

## 2010-12-31 ENCOUNTER — Ambulatory Visit (INDEPENDENT_AMBULATORY_CARE_PROVIDER_SITE_OTHER): Payer: Medicare Other | Admitting: Surgery

## 2010-12-31 ENCOUNTER — Encounter (INDEPENDENT_AMBULATORY_CARE_PROVIDER_SITE_OTHER): Payer: Medicare Other

## 2010-12-31 ENCOUNTER — Telehealth: Payer: Self-pay

## 2010-12-31 DIAGNOSIS — M79609 Pain in unspecified limb: Secondary | ICD-10-CM

## 2010-12-31 DIAGNOSIS — Z48812 Encounter for surgical aftercare following surgery on the circulatory system: Secondary | ICD-10-CM

## 2010-12-31 DIAGNOSIS — I70219 Atherosclerosis of native arteries of extremities with intermittent claudication, unspecified extremity: Secondary | ICD-10-CM

## 2010-12-31 DIAGNOSIS — L97909 Non-pressure chronic ulcer of unspecified part of unspecified lower leg with unspecified severity: Secondary | ICD-10-CM

## 2010-12-31 DIAGNOSIS — I739 Peripheral vascular disease, unspecified: Secondary | ICD-10-CM

## 2010-12-31 DIAGNOSIS — N2889 Other specified disorders of kidney and ureter: Secondary | ICD-10-CM

## 2011-01-01 NOTE — Assessment & Plan Note (Signed)
OFFICE VISIT  [name], [name] DOB:  [birth year]                                       12/31/2010 EAVWU#:[id number]  The patient comes back today for followup.  This is an 77 year old gentleman initially seen by Dr. Hart Rochester for left leg rest pain.  He had been nonambulatory for 4-6 weeks.  He did not have any wounds.  An ABI on the right was 0.98 with triphasic flow.  He had absent flow in the left leg.  He came in and was set up for arteriogram which had to be delayed for hydration due to his renal insufficiency.  He underwent subintimal recanalization of the left leg within the superficial femoral and popliteal artery on 06/14.  He was stented using 7 mm Cordis stents. He is back today for followup.  He has had significant improvement in the color and pain in his leg.  He still has occasional burning feeling.  PHYSICAL EXAMINATION:  Vital signs:  Heart rate 87, blood pressure 144/83, temperature is 97.8.  General:  Well-appearing, in no distress. Respirations:  Are nonlabored.  Abdomen:  Soft, nontender.  He has palpable pedal pulses bilaterally.  No ulceration.  DIAGNOSTIC STUDIES:  ABI is 1.0 on the right and 1.0 on the left, both with triphasic waveforms.  CT scan:  The patient has infrarenal aneurysm maximal diameter is 4.7. There is also hyperdense lesion in the right kidney.  ASSESSMENT AND PLAN: 1. Peripheral vascular disease:  The patient has normal ABIs at this     time with a palpable pulse.  He will need to continue with     surveillance duplex ultrasound to evaluate the stents that were     placed.  He will also need to be continued on Plavix.  He will get     an ultrasound in 3 months.  I suspect that the burning and pain     that he is having in his foot is due to nerve damage from his foot     ischemia.  I have given him a prescription for Neurontin to see if     this helps. 2. Abdominal aortic aneurysm:  Maximum diameter of his  aneurysm is 4.7     cm.  I will have this ultrasounded in 6 months.  I will see him     back in 6 months.  I did discuss that he is at risk for an aneurysm     rupture.  However, this is far less than the risk of his operative     complications at this time.    Leonard Ny, MD Electronically Signed  VWB/MEDQ  D:  12/31/2010  T:  01/01/2011  Job:  [id number]  cc:   Quita Skye. Hart Rochester, M.D. Harold A. Tanda Rockers, M.D. [name]. Dietrich Pates, MD, [facility] Milus Mallick. Lodema Hong, M.D.

## 2011-01-06 ENCOUNTER — Ambulatory Visit
Admission: RE | Admit: 2011-01-06 | Discharge: 2011-01-06 | Disposition: A | Discharge status: Home / Self Care | Payer: Medicare Other | Source: Ambulatory Visit | Attending: Surgery | Admitting: Surgery

## 2011-01-06 DIAGNOSIS — N2889 Other specified disorders of kidney and ureter: Secondary | ICD-10-CM

## 2011-01-06 MED ORDER — GADOBENATE DIMEGLUMINE 529 MG/ML IV SOLN: 20.0000 mL | Freq: Once | INTRAVENOUS | Status: AC | PRN

## 2011-01-06 NOTE — Op Note (Signed)
[name], [name] [name]             ACCOUNT NO.:  [id number]  MEDICAL RECORD NO.:  [id number]  LOCATION:  2030                         FACILITY:  [facility]  PHYSICIAN:  Juleen China IV, MDDATE OF BIRTH:  03-02-1922  DATE OF PROCEDURE:  12/06/2010 DATE OF DISCHARGE:                              OPERATIVE REPORT   PREOPERATIVE DIAGNOSIS:  Ischemic left leg pain.  POSTOPERATIVE DIAGNOSIS:  Ischemic left leg pain.  PROCEDURE PERFORMED: 1. Ultrasound access, right femoral artery. 2. Abdominal aortogram. 3. Left lower extremity runoff. 4. Stent left superficial femoral and popliteal artery.  INDICATIONS:  This is an 77 year old gentleman with limb threatening ischemia to the left leg.  He was recently brought in the day prior for his angiogram, but found to be in acute renal failure.  He was therefore hydrated overnight.  His creatinine has recovered.  He comes in for his procedure.  PROCEDURE:  The patient was identified in the holding area, taken to room #7, placed supine on the table.  Both groins were prepped and draped in usual fashion.  Time-out was called.  The right femoral artery was then accessed with an 18-gauge needle.  A 0.35 wire was advanced into the aorta under fluoroscopic visualization.  A 5-French sheath was placed.  Over the wire, Omni flush catheter was advanced at the level of L1 and abdominal aortogram was obtained.  Next, using the Omni flush catheter and Bentson wire, their bifurcation was crossed.  Catheter was placed in the left external iliac artery and left leg runoff was performed.  FINDINGS:  Aortogram:  The visualized portions of suprarenal abdominal aorta showed no significant disease.  There are single renal arteries bilaterally, which are widely patent.  The infrarenal abdominal aorta is aneurysmal, but patent without stenosis.  Bilateral common iliac arteries are ectatic without stenosis.  Both hypogastric arteries are patent.  Left lower  extremity:  The left common femoral artery is widely patent.  There are two main profunda femoral branches that are patent. The superficial femoral artery is occluded with reconstitution of the popliteal artery at the level of the patella.  The below-knee popliteal artery is patent.  The patient has diffuse tibial disease, but both appeared to be patent down to the ankle.  It is difficult to evaluate blood flow in the foot.  At this point in time, the decision was made to intervene over a Rosen wire.  A 7-French Ansel-1 sheath was advanced into the left common femoral artery using a Terumo glide catheter and hybrid wire with glide tip.  The lesion was successfully crossed.  The contrast injection was performed with the catheter tip and the popliteal artery to confirm successful crossing of the lesion.  The lesion was then predilated using a 4-mm balloon.  I elected to primarily stent this area.  I deployed two overlapping 7 x 150 Cordis stent followed by 8 x 150 Cordis stent landing at the superficial femoral origin.  The stents were then molded to confirmation with 6-mm balloon.  A completion angiogram was performed.  This shows widely patent superficial femoral and popliteal artery with no change in the runoff.  Runoff was better evaluated at this time due to  the proximal disease.  The posterior tibial at this time does cross the ankle.  Next, I performed a retrograde groin injection to evaluate for closure device, was adequate.  A Proglide was successfully deployed.  The patient was taken to the holding area for sheath pull.  He was given Plavix loading dose on the table.  IMPRESSION: 1. Large infrarenal abdominal aortic aneurysm. 2. Successful recanalization of the left superficial femoral and     popliteal occlusion.  This was done using Cordis self-expanding     stents, 7 x 150, 7 x 150, 8 x 150.     Jorge Ny, MD     VWB/MEDQ  D:  12/06/2010  T:  12/07/2010   Job:  [id number]  Electronically Signed by Arelia Longest IV MD on 01/06/2011 10:57:47 PM

## 2011-01-07 NOTE — Telephone Encounter (Signed)
Will advise daughter that Dr said it was fine. She was going to cancel it anyway

## 2011-01-07 NOTE — Telephone Encounter (Signed)
I think they can cancel the ne[phrologist, yes, she needs to do that

## 2011-01-09 ENCOUNTER — Ambulatory Visit: Payer: Medicare Other | Admitting: Cardiology

## 2011-01-10 ENCOUNTER — Encounter: Payer: Self-pay | Admitting: Cardiology

## 2011-01-14 ENCOUNTER — Ambulatory Visit: Payer: Medicare Other | Admitting: Cardiology

## 2011-01-17 ENCOUNTER — Encounter: Payer: Self-pay | Admitting: Family Medicine

## 2011-01-18 ENCOUNTER — Encounter (HOSPITAL_COMMUNITY): Payer: Self-pay | Admitting: *Deleted

## 2011-01-18 ENCOUNTER — Emergency Department (HOSPITAL_COMMUNITY): Payer: Medicare Other

## 2011-01-18 ENCOUNTER — Inpatient Hospital Stay (HOSPITAL_COMMUNITY)
Admission: EM | Admit: 2011-01-18 | Discharge: 2011-01-20 | DRG: 280 | Disposition: A | Discharge status: Home / Self Care | Payer: Medicare Other | Attending: Internal Medicine | Admitting: Internal Medicine

## 2011-01-18 DIAGNOSIS — J449 Chronic obstructive pulmonary disease, unspecified: Secondary | ICD-10-CM

## 2011-01-18 DIAGNOSIS — E669 Obesity, unspecified: Secondary | ICD-10-CM

## 2011-01-18 DIAGNOSIS — IMO0002 Reserved for concepts with insufficient information to code with codable children: Secondary | ICD-10-CM

## 2011-01-18 DIAGNOSIS — Z954 Presence of other heart-valve replacement: Secondary | ICD-10-CM

## 2011-01-18 DIAGNOSIS — M1990 Unspecified osteoarthritis, unspecified site: Secondary | ICD-10-CM

## 2011-01-18 DIAGNOSIS — J9690 Respiratory failure, unspecified, unspecified whether with hypoxia or hypercapnia: Secondary | ICD-10-CM

## 2011-01-18 DIAGNOSIS — I509 Heart failure, unspecified: Secondary | ICD-10-CM | POA: Diagnosis present

## 2011-01-18 DIAGNOSIS — Z951 Presence of aortocoronary bypass graft: Secondary | ICD-10-CM

## 2011-01-18 DIAGNOSIS — I4891 Unspecified atrial fibrillation: Secondary | ICD-10-CM

## 2011-01-18 DIAGNOSIS — J441 Chronic obstructive pulmonary disease with (acute) exacerbation: Secondary | ICD-10-CM | POA: Diagnosis present

## 2011-01-18 DIAGNOSIS — C61 Malignant neoplasm of prostate: Secondary | ICD-10-CM

## 2011-01-18 DIAGNOSIS — I2510 Atherosclerotic heart disease of native coronary artery without angina pectoris: Secondary | ICD-10-CM | POA: Diagnosis present

## 2011-01-18 DIAGNOSIS — J4489 Other specified chronic obstructive pulmonary disease: Secondary | ICD-10-CM | POA: Diagnosis present

## 2011-01-18 DIAGNOSIS — I5030 Unspecified diastolic (congestive) heart failure: Principal | ICD-10-CM | POA: Diagnosis present

## 2011-01-18 DIAGNOSIS — R7301 Impaired fasting glucose: Secondary | ICD-10-CM

## 2011-01-18 DIAGNOSIS — L8990 Pressure ulcer of unspecified site, unspecified stage: Secondary | ICD-10-CM

## 2011-01-18 DIAGNOSIS — I38 Endocarditis, valve unspecified: Secondary | ICD-10-CM

## 2011-01-18 DIAGNOSIS — E785 Hyperlipidemia, unspecified: Secondary | ICD-10-CM

## 2011-01-18 DIAGNOSIS — D508 Other iron deficiency anemias: Secondary | ICD-10-CM

## 2011-01-18 DIAGNOSIS — I214 Non-ST elevation (NSTEMI) myocardial infarction: Secondary | ICD-10-CM | POA: Diagnosis present

## 2011-01-18 DIAGNOSIS — M1710 Unilateral primary osteoarthritis, unspecified knee: Secondary | ICD-10-CM

## 2011-01-18 DIAGNOSIS — E739 Lactose intolerance, unspecified: Secondary | ICD-10-CM

## 2011-01-18 DIAGNOSIS — I10 Essential (primary) hypertension: Secondary | ICD-10-CM | POA: Diagnosis present

## 2011-01-18 DIAGNOSIS — J9620 Acute and chronic respiratory failure, unspecified whether with hypoxia or hypercapnia: Secondary | ICD-10-CM | POA: Diagnosis present

## 2011-01-18 DIAGNOSIS — I739 Peripheral vascular disease, unspecified: Secondary | ICD-10-CM | POA: Insufficient documentation

## 2011-01-18 HISTORY — DX: Non-ST elevation (NSTEMI) myocardial infarction: I21.4

## 2011-01-18 LAB — MRSA PCR SCREENING: MRSA by PCR: NEGATIVE

## 2011-01-18 LAB — BASIC METABOLIC PANEL
GFR calc Af Amer: >60 mL/min (ref >60)
GFR calc non Af Amer: 58 mL/min — ABNORMAL LOW (ref >60)
Potassium: 3.9 mEq/L (ref 3.5–5.1)
Sodium: 142 mEq/L (ref 135–145)

## 2011-01-18 LAB — BLOOD GAS, ARTERIAL
Patient temperature: 37
TCO2: 27.4 mmol/L (ref 0–100)
pH, Arterial: 7.331 — ABNORMAL LOW (ref 7.350–7.450)

## 2011-01-18 LAB — GLUCOSE, CAPILLARY

## 2011-01-18 LAB — CBC
MCHC: 31.5 g/dL (ref 30.0–36.0)
RDW: 15.6 % — ABNORMAL HIGH (ref 11.5–15.5)

## 2011-01-18 LAB — CARDIAC PANEL(CRET KIN+CKTOT+MB+TROPI)
Relative Index: 7.8 — ABNORMAL HIGH (ref 0.0–2.5)
Total CK: 141 U/L (ref 7–232)

## 2011-01-18 MED ORDER — FUROSEMIDE 10 MG/ML IJ SOLN
40.0000 mg | Freq: Every day | INTRAMUSCULAR | Status: DC
  Administered 2011-01-18: 40 mg via INTRAVENOUS
  Filled 2011-01-18: qty 4

## 2011-01-18 MED ORDER — ACETAMINOPHEN 325 MG PO TABS
650.0000 mg | ORAL_TABLET | Freq: Four times a day (QID) | ORAL | Status: DC | PRN
Start: 2011-01-18 — End: 2011-01-20

## 2011-01-18 MED ORDER — ENOXAPARIN SODIUM 40 MG/0.4ML ~~LOC~~ SOLN
40.0000 mg | Freq: Every day | SUBCUTANEOUS | Status: DC
  Administered 2011-01-18 – 2011-01-20 (×3): 40 mg via SUBCUTANEOUS
  Filled 2011-01-18 (×3): qty 0.4

## 2011-01-18 MED ORDER — METOPROLOL TARTRATE 25 MG PO TABS
25.0000 mg | ORAL_TABLET | Freq: Two times a day (BID) | ORAL | Status: DC
  Administered 2011-01-18 – 2011-01-20 (×4): 25 mg via ORAL
  Filled 2011-01-18 (×5): qty 1

## 2011-01-18 MED ORDER — FLUTICASONE-SALMETEROL 100-50 MCG/DOSE IN AEPB
1.0000 | INHALATION_SPRAY | Freq: Two times a day (BID) | RESPIRATORY_TRACT | Status: DC
  Administered 2011-01-18 – 2011-01-20 (×4): 1 via RESPIRATORY_TRACT
  Filled 2011-01-18: qty 14

## 2011-01-18 MED ORDER — PANTOPRAZOLE SODIUM 40 MG PO TBEC
40.0000 mg | DELAYED_RELEASE_TABLET | Freq: Every day | ORAL | Status: DC
  Administered 2011-01-18 – 2011-01-19 (×2): 40 mg via ORAL
  Filled 2011-01-18: qty 1

## 2011-01-18 MED ORDER — ASPIRIN 325 MG PO TABS
325.0000 mg | ORAL_TABLET | Freq: Once | ORAL | Status: AC
  Administered 2011-01-18: 325 mg via ORAL
  Filled 2011-01-18: qty 1

## 2011-01-18 MED ORDER — LEVOFLOXACIN IN D5W 500 MG/100ML IV SOLN
500.0000 mg | INTRAVENOUS | Status: DC
  Administered 2011-01-18 – 2011-01-19 (×2): 500 mg via INTRAVENOUS
  Filled 2011-01-18 (×3): qty 100

## 2011-01-18 MED ORDER — ONDANSETRON HCL 4 MG PO TABS: 4.0000 mg | ORAL_TABLET | Freq: Four times a day (QID) | ORAL | Status: DC | PRN

## 2011-01-18 MED ORDER — ONDANSETRON HCL 4 MG/2ML IJ SOLN: 4.0000 mg | Freq: Four times a day (QID) | INTRAMUSCULAR | Status: DC | PRN

## 2011-01-18 MED ORDER — LEVALBUTEROL HCL 1.25 MG/3ML IN NEBU
INHALATION_SOLUTION | RESPIRATORY_TRACT | Status: AC
  Administered 2011-01-19: 1.25 mg via RESPIRATORY_TRACT
  Filled 2011-01-18: qty 3

## 2011-01-18 MED ORDER — LEVALBUTEROL HCL 0.63 MG/3ML IN NEBU: 0.6300 mg | INHALATION_SOLUTION | RESPIRATORY_TRACT | Status: DC | PRN

## 2011-01-18 MED ORDER — HYDROCODONE-ACETAMINOPHEN 5-325 MG PO TABS: 1.0000 | ORAL_TABLET | ORAL | Status: DC | PRN

## 2011-01-18 MED ORDER — SENNA 8.6 MG PO TABS
2.0000 | ORAL_TABLET | Freq: Every day | ORAL | Status: DC | PRN
  Administered 2011-01-18: 17.2 mg via ORAL
  Filled 2011-01-18: qty 2

## 2011-01-18 MED ORDER — ALBUTEROL SULFATE (5 MG/ML) 0.5% IN NEBU
5.0000 mg | INHALATION_SOLUTION | Freq: Once | RESPIRATORY_TRACT | Status: AC
  Administered 2011-01-18: 5 mg via RESPIRATORY_TRACT
  Filled 2011-01-18: qty 1

## 2011-01-18 MED ORDER — ASPIRIN EC 81 MG PO TBEC
81.0000 mg | DELAYED_RELEASE_TABLET | Freq: Every day | ORAL | Status: DC
  Administered 2011-01-19 – 2011-01-20 (×2): 81 mg via ORAL
  Filled 2011-01-18 (×2): qty 1

## 2011-01-18 MED ORDER — SIMVASTATIN 20 MG PO TABS
20.0000 mg | ORAL_TABLET | Freq: Every day | ORAL | Status: DC
  Administered 2011-01-18 – 2011-01-19 (×2): 20 mg via ORAL
  Filled 2011-01-18 (×2): qty 1

## 2011-01-18 MED ORDER — IPRATROPIUM BROMIDE 0.02 % IN SOLN
0.5000 mg | Freq: Four times a day (QID) | RESPIRATORY_TRACT | Status: DC
  Administered 2011-01-18 – 2011-01-20 (×6): 0.5 mg via RESPIRATORY_TRACT
  Filled 2011-01-18 (×6): qty 2.5

## 2011-01-18 MED ORDER — SODIUM CHLORIDE 0.9 % IJ SOLN
3.0000 mL | Freq: Two times a day (BID) | INTRAMUSCULAR | Status: DC
  Administered 2011-01-18 – 2011-01-19 (×2): 3 mL via INTRAVENOUS
  Filled 2011-01-18 (×2): qty 3

## 2011-01-18 MED ORDER — ACETAMINOPHEN 650 MG RE SUPP: 650.0000 mg | Freq: Four times a day (QID) | RECTAL | Status: DC | PRN

## 2011-01-18 MED ORDER — CLOPIDOGREL BISULFATE 75 MG PO TABS
75.0000 mg | ORAL_TABLET | Freq: Every day | ORAL | Status: DC
  Administered 2011-01-18 – 2011-01-20 (×3): 75 mg via ORAL
  Filled 2011-01-18 (×3): qty 1

## 2011-01-18 MED ORDER — MULTIVITAMINS PO TABS: 1.0000 | ORAL_TABLET | Freq: Every day | ORAL | Status: DC

## 2011-01-18 MED ORDER — LEVALBUTEROL HCL 1.25 MG/3ML IN NEBU
1.2500 mg | INHALATION_SOLUTION | Freq: Four times a day (QID) | RESPIRATORY_TRACT | Status: DC
  Administered 2011-01-18 – 2011-01-20 (×6): 1.25 mg via RESPIRATORY_TRACT
  Filled 2011-01-18 (×6): qty 3

## 2011-01-18 MED ORDER — THERA M PLUS PO TABS
1.0000 | ORAL_TABLET | Freq: Every day | ORAL | Status: DC
  Administered 2011-01-18 – 2011-01-20 (×3): 1 via ORAL
  Filled 2011-01-18 (×3): qty 1

## 2011-01-18 MED ORDER — IPRATROPIUM BROMIDE 0.02 % IN SOLN
500.0000 ug | Freq: Four times a day (QID) | RESPIRATORY_TRACT | Status: DC
  Administered 2011-01-18: 500 ug via RESPIRATORY_TRACT
  Filled 2011-01-18: qty 2.5

## 2011-01-18 MED ORDER — SODIUM CHLORIDE 0.9 % IV SOLN
INTRAVENOUS | Status: DC
  Administered 2011-01-18: 17:00:00 via INTRAVENOUS

## 2011-01-18 MED ORDER — ALLOPURINOL 300 MG PO TABS
300.0000 mg | ORAL_TABLET | Freq: Every day | ORAL | Status: DC
  Administered 2011-01-18 – 2011-01-20 (×3): 300 mg via ORAL
  Filled 2011-01-18 (×3): qty 1

## 2011-01-18 MED ORDER — LEVALBUTEROL HCL 1.25 MG/0.5ML IN NEBU
1.2500 mg | INHALATION_SOLUTION | Freq: Four times a day (QID) | RESPIRATORY_TRACT | Status: DC | PRN
  Administered 2011-01-18: 1.25 mg via RESPIRATORY_TRACT
  Filled 2011-01-18: qty 0.5

## 2011-01-18 MED ORDER — METHYLPREDNISOLONE SODIUM SUCC 125 MG IJ SOLR
125.0000 mg | Freq: Three times a day (TID) | INTRAMUSCULAR | Status: DC
  Administered 2011-01-18 – 2011-01-19 (×2): 125 mg via INTRAVENOUS
  Filled 2011-01-18 (×5): qty 1.04
  Filled 2011-01-18 (×2): qty 2
  Filled 2011-01-18 (×4): qty 1.04

## 2011-01-18 NOTE — ED Notes (Signed)
Before transfer to unit, troponin noted to be elevated Repeat EKG and initial EKG reviewed and shows no acute ST changes D/w dr Johney Frame, on for [place], would not perform cardiac cath at this time, he can be managed at [facility] D/w dr Karilyn Cota, who saw patient, and he feels comfortable managing pt at [facility], MD 01/18/11 1442

## 2011-01-18 NOTE — ED Notes (Signed)
Jessica in RT reported critical lab result of PCO2 58.2. Dr Bebe Shaggy notified.

## 2011-01-18 NOTE — ED Notes (Signed)
Called to give report to ICU. Was informed nurse who was called in to take pt had not arrived. Awaiting nurse arrival for report.

## 2011-01-18 NOTE — ED Notes (Signed)
Recently had stents placed to left leg July 2. Generalized swelling and congestion per family member. Hx of CHF.

## 2011-01-18 NOTE — ED Notes (Signed)
Lab called critical result of troponin 4.7 and CKMB 12.8. Dr. Bebe Shaggy notified.

## 2011-01-18 NOTE — H&P (Addendum)
[name] MRN: [id number] DOB/AGE: January 17, 1922 77 y.o. Primary Care Physician:Margaret Lodema Hong, MD, MD Admit date: 01/18/2011 Chief Complaint: Dyspnea, confusion. HPI: This  pleasant 77 year old man presents with a 24-36 hour history of the above symptoms. According to his daughter, who is present with him in the emergency room, he had been disconnected from his oxygen for at least 6 hours yesterday whereupon he became more confused and slightly more dyspneic. Otherwise, he had been doing well since his discharge from the hospital on 11/03/2010 when he initially had presented with respiratory failure and required intubation and mechanical ventilation. He had been maintained on home oxygen 2 L per minute. During this admission in May, it was felt that he had respiratory failure secondary to COPD exacerbation, sepsis secondary to community-acquired pneumonia and a degree of diastolic congestive heart failure as well as non-ST elevation myocardial infarction secondary to demand ischemia. He has been evaluated in emergency room and has been placed on BiPAP because of type II respiratory failure with hypercapnia. He appears to be hemodynamically stable. The emergency room physician requests to admit the patient now for respiratory failure. Please note that his oral Lasix was discontinued approximately 6 weeks ago prior to stenting procedure in his left leg. According to the daughter, this was because his potassium was abnormal. Looking at his previous medical records his potassium was 5.3 and he also had an elevated BUN. However, his creatinine was within normal range.   Past medical history: 1. Mitral valve replacement and CABG in February 2003 for severe mitral regurgitation with class IV congestive heart failure as well as coronary artery disease and atrial fibrillation. 2. Oxygen-dependent COPD. 3. Diastolic congestive heart failure with most recent echocardiogram in May 2012 which showed an  ejection fraction of 50-55%. 3. Atrial fibrillation, not a candidate for anticoagulation. He does have a previous history of GI bleed. 4. Hypertension. 5. Hyperlipidemia. 6. Osteoarthritis. 7. Obesity. 8. History of prostate cancer. 9. Peripheral vascular disease with recent stenting in the left leg in June 2012.                                     Family history: Both his mother and brother had strokes.  Social history: He lives with his daughter. He quit smoking approximately 6 months ago. There is no history of alcohol abuse. He is [employer].  Allergies: No Active Allergies  Medications Prior to Admission  Medication Dose Route Frequency Provider Last Rate Last Dose  . albuterol (PROVENTIL) (5 MG/ML) 0.5% nebulizer solution 5 mg  5 mg Nebulization Once Joya Gaskins, MD   5 mg at 01/18/11 1243   Medications Prior to Admission  Medication Sig Dispense Refill  . allopurinol (ZYLOPRIM) 300 MG tablet take 1 tablet by mouth once daily  30 tablet  1  . Fluticasone-Salmeterol (ADVAIR DISKUS) 100-50 MCG/DOSE AEPB One puff twice daily  60 each  2  . ipratropium (ATROVENT) 0.02 % nebulizer solution Take 500 mcg by nebulization every 6 (six) hours.       Marland Kitchen levalbuterol (XOPENEX) 1.25 MG/0.5ML nebulizer solution Take 1 ampule by nebulization every 6 (six) hours as needed. For shortness of breath      . lovastatin (MEVACOR) 40 MG tablet Take 40 mg by mouth as directed. On Sunday, Monday, Wednesday & friday      . metoprolol (LOPRESSOR) 50 MG tablet Take 25 mg by mouth 2 (two) times  daily.       . multivitamin (THERAGRAN) per tablet Take 1 tablet by mouth daily.        Marland Kitchen omeprazole (PRILOSEC) 40 MG capsule take 1 capsule by mouth once daily 1 capsule by mouth once daily  30 capsule  1  . benazepril (LOTENSIN) 20 MG tablet        . Benzonatate (TESSALON PERLES PO) Take 1 tablet by mouth 3 (three) times daily.        . penicillin v potassium (VEETID) 500 MG tablet Take 500 mg by mouth  3 (three) times daily.        . potassium chloride SA (KLOR-CON M20) 20 MEQ tablet Take 20 mEq by mouth daily.        . Skin Protectants, Misc. [facility] SKIN PROTECTANT) 50 % OINT Apply to affected area as needed  400 g  1  . traMADol (ULTRAM) 50 MG tablet take 1 tablet by mouth twice a day  40 tablet  2       ZOX:WRUEA from the symptoms mentioned above,there are no other symptoms referable to all systems reviewed.  Physical Exam: Blood pressure 129/84, pulse 98, temperature 98.2 F (36.8 C), temperature source Oral, resp. rate 23, height 6\' 2"  (1.88 m), weight 99.338 kg (219 lb), SpO2 95.00%. He looks systemically well and does not appear to be toxic/septic. He has a BiPAP in place. However, he does not appear to have excess work of breathing. There is no peripheral or central cyanosis. Cardiovascular: Heart sounds are present and in atrial fibrillation. There are no murmurs. Jugular venous pressure is not raised. Respiratory: Lung fields appear to be clear anteriorly. He is not able to sit up for me to examine him posteriorly. There was no excessive wheezing. There were no crackles. There was no bronchial breathing. Abdomen: Soft, nontender, no masses. There is no hepato-splenomegaly. Neurological: He does appear to be alert and orientated now in the emergency room. There are no focal neurological signs. Skin: No abnormalities of significance.    Basename 01/18/11 1216  WBC 8.4  NEUTROABS --  HGB 12.3*  HCT 39.0  MCV 103.4*  PLT 165    Basename 01/18/11 1216  NA 142  K 3.9  CL 104  CO2 30  GLUCOSE 119*  BUN 23  CREATININE 1.19  CALCIUM 8.7  MG --  PHOS --     Dg Chest 2 View  01/18/2011  *RADIOLOGY REPORT*  Clinical Data: Shortness of breath, facial and leg swelling.  CHEST - 2 VIEW  Comparison: 10/31/2010  Findings: Prior CABG.  There is cardiomegaly with vascular congestion.  Bibasilar atelectasis and small effusions.  No overt edema.  No acute bony abnormality.   IMPRESSION: Cardiomegaly, vascular congestion.  Bibasilar atelectasis with small effusions.  Original Report Authenticated By: Cyndie Chime, M.D.   Mr Pelvis W Wo Contrast  01/06/2011  *RADIOLOGY REPORT*  Clinical Data:  Renal mass seen on CT angiogram.  Abdominal aortic aneurysm.  History prostate cancer.  MRI ABDOMEN AND PELVIS WITHOUT AND WITH CONTRAST  Technique:  Multiplanar multisequence MR imaging of the abdomen and pelvis was performed both before and after the administration of intravenous contrast.  Contrast: 20 ml Multihance  Comparison:  08/03/2008; 12/24/2010  MRI ABDOMEN  Findings:  Levoconvex lumbar scoliosis noted.  The gallbladder is half filled with what appears to be a combination of sludge and small gallstones.  The 1.7 x 1.4 cm lesion of concern along the right lateral kidney does  not enhance and has intermediate high T2 signal characteristics and intermediate to low T1 signal characteristics.  Multiple additional bilateral simple appearing renal cysts are noted.  Visualized portions of the liver, spleen, and adrenal glands appear normal.  No abnormal pancreatic enhancement identified.  The fusiform infrarenal abdominal aortic aneurysm is again noted and appears unchanged from the recent CT day.  IMPRESSION:  1.  The hypodense right exophytic renal lesion described on the prior CT scan represents a Bosniak category II cyst (complex, but benign.).  Additional simple appearing renal cysts are present bilaterally. 2.  Unchanged infrarenal abdominal aortic aneurysm. 2.  Numerous layering gallstones. 3.  Lumbar scoliosis and spondylosis.  MRI PELVIS  Findings: There is prominence of the prostate gland which indents the urinary bladder.  The prominent and somewhat irregular prostatic urethra may represent prior transurethral resection of the prostate.  On T2-weighted images, the characteristic zonal anatomy of the prostate is poorly seen - the usually hyperintense peripheral zone is not well  appreciated.  No pathologic pelvic adenopathy is identified.  No free pelvic fluid identified.  Lower lumbar spondylosis and degenerative disc disease are present, with mild foraminal impingement suspected bilaterally at L3-4 L4-5.  No compelling findings of prostate metastatic disease.  Several tiny bone islands are present in the hips.  IMPRESSION:  1. Suspected prostatic tissue indents the bladder base, with irregularity of the prostatic margin of the prostatic urethra suggesting prior transurethral resection of prostate.  Prostate zonal anatomy is difficult to assess this today's exam was not a dedicated prostate MRI. If the patient has rising PSA level or other concerns for recurrent prostate cancer, then biopsy may be indicated. 2.  Lower lumbar spondylosis with foraminal narrowing.  Original Report Authenticated By: Dellia Cloud, M.D.   Mr Abdomen W Wo Contrast  01/06/2011  *RADIOLOGY REPORT*  Clinical Data:  Renal mass seen on CT angiogram.  Abdominal aortic aneurysm.  History prostate cancer.  MRI ABDOMEN AND PELVIS WITHOUT AND WITH CONTRAST  Technique:  Multiplanar multisequence MR imaging of the abdomen and pelvis was performed both before and after the administration of intravenous contrast.  Contrast: 20 ml Multihance  Comparison:  08/03/2008; 12/24/2010  MRI ABDOMEN  Findings:  Levoconvex lumbar scoliosis noted.  The gallbladder is half filled with what appears to be a combination of sludge and small gallstones.  The 1.7 x 1.4 cm lesion of concern along the right lateral kidney does not enhance and has intermediate high T2 signal characteristics and intermediate to low T1 signal characteristics.  Multiple additional bilateral simple appearing renal cysts are noted.  Visualized portions of the liver, spleen, and adrenal glands appear normal.  No abnormal pancreatic enhancement identified.  The fusiform infrarenal abdominal aortic aneurysm is again noted and appears unchanged from the recent  CT day.  IMPRESSION:  1.  The hypodense right exophytic renal lesion described on the prior CT scan represents a Bosniak category II cyst (complex, but benign.).  Additional simple appearing renal cysts are present bilaterally. 2.  Unchanged infrarenal abdominal aortic aneurysm. 2.  Numerous layering gallstones. 3.  Lumbar scoliosis and spondylosis.  MRI PELVIS  Findings: There is prominence of the prostate gland which indents the urinary bladder.  The prominent and somewhat irregular prostatic urethra may represent prior transurethral resection of the prostate.  On T2-weighted images, the characteristic zonal anatomy of the prostate is poorly seen - the usually hyperintense peripheral zone is not well appreciated.  No pathologic pelvic adenopathy is identified.  No  free pelvic fluid identified.  Lower lumbar spondylosis and degenerative disc disease are present, with mild foraminal impingement suspected bilaterally at L3-4 L4-5.  No compelling findings of prostate metastatic disease.  Several tiny bone islands are present in the hips.  IMPRESSION:  1. Suspected prostatic tissue indents the bladder base, with irregularity of the prostatic margin of the prostatic urethra suggesting prior transurethral resection of prostate.  Prostate zonal anatomy is difficult to assess this today's exam was not a dedicated prostate MRI. If the patient has rising PSA level or other concerns for recurrent prostate cancer, then biopsy may be indicated. 2.  Lower lumbar spondylosis with foraminal narrowing.  Original Report Authenticated By: Dellia Cloud, M.D.   Ct Angio Abd/pel W/ And/or W/o  12/24/2010  *RADIOLOGY REPORT*  Clinical Data: Abdominal aortic aneurysm  CT ANGIOGRAPHY OF ABDOMEN AND PELVIS - PRESTENT PROTOCOL  Technique:  Multidetector CT imaging of the abdomen and pelvis was performed during bolus injection of intravenous contrast. Multiplanar CT angiographic image reconstructions including MIPs were also  generated to evaluate the vascular anatomy.  Contrast:  125 ml Omnipaque 350  Comparison:  08/03/2008  Findings: Cholelithiasis.  Umbilical hernia containing adipose tissue.  Liver, spleen, adrenal glands, pancreas are within normal limits.  Several simple cysts are present in the kidneys.  There are some hypodensities are too small to characterize.  There is an hyperdense 1.5 cm lesion in the right kidney on image 66 of series 5 and image 22 of series 7.  No free fluid.  No abnormal adenopathy.  Multilevel degenerative disc disease in the lumbar spine is present.  Length of infrarenal neck (from lowest renal artery): 3.6 cm Number of renal arteries:  Right = one; Left = one Diameter of infrarenal neck:  23 mm Total length of aneurysm:  10.9 cm Aneurysm ends at aortic bifurcation: Yes  If no, Distance from aneurysm to bifurcation: Greatest aneurysm diameter:  4.7 cmGreatest common iliac artery diameters:  Right =1.8 cm; Left = 1.8 cm Diameter of common iliac arteries just above iliac bifurcation: Right = 1.4 cm; Left = 1.8 cm Length of common iliac arteries:  Right = 6.6 cm; Left = 5.6 cm   Review of the MIP images confirms the above findings.  IMPRESSION: Abdominal aortic aneurysm as described.  Maximal diameter is 4.7 cm.  Hyperdense lesion in the right kidney as described. This may be an enhancing lesion.  MRI is recommended to further characterize. Renal cell carcinoma is not excluded.  Cholelithiasis.  Original Report Authenticated By: Donavan Burnet, M.D.   Impression: 1. Acute on chronic respiratory failure likely secondary to a combination of diastolic congestive heart failure and COPD. 2. Oxygen-dependent COPD. 3. History of diastolic congestive heart failure. 4. Chronic atrial fibrillation. 5. Hypertension.  6. Elevated troponin levels, probable demand ischemia.      Plan: 1. Admit to intensive care unit as he is on BiPAP. Try to wean off BiPAP. 2. Intravenous diuretics. 3. Empirical  intravenous antibiotics and steroids. 4. Cycle cardiac enzymes. He is not really a candidate for cardiac catheterization and aggressive treatment and this has already been discussed with [facility] by the Emergency Room physician. Therefore I think it is reasonable to manage this patient at [facility]  as opposed to transferring him to [facility]. Further recommendations will depend on patient's hospital progress.      Lynell Kussman C 01/18/2011, 2:14 PM

## 2011-01-18 NOTE — ED Provider Notes (Signed)
History     Chief Complaint  Patient presents with  . congestion, generalized edema    Patient is a 77 y.o. male presenting with shortness of breath. The history is provided by a relative. No language interpreter was used.  Shortness of Breath  Episode onset: yesterday. The onset was gradual. The problem occurs continuously. The problem has been gradually worsening. The symptoms are relieved by nothing. The symptoms are aggravated by nothing. Associated symptoms include shortness of breath and wheezing. Pertinent negatives include no chest pain, no chest pressure and no fever. He has had prior hospitalizations. He has had prior ICU admissions. Past medical history comments: COPD. Recently, medical care has been given by a specialist.  Patient referred to ED by Dr. Lodema Hong. Per family member, patient with gradually worsening SOB onset yesterday with associated wheezing, congestion, decreased appetite, decreased fluid intake and moderate confusion. Denies patient c/o chest pain and with increased swelling of lower extremities. Family member reports patient's congestion and confusion have moderately improved since yesterday. States patient was recently discharged from ICU and also had 3 stents placed in his LLE on December 24, 2010.  for confusion Notes patient on home O2-2L all the time. H/o CHF, COPD, hypertension, valvular heart disease, hyperlipidemia, hypertension, pneumonia, atrial fibrillation, prostate cancer.   Patient seen at 12:06 PM   Past Medical History  Diagnosis Date  . Valvular heart disease   . Obesity   . Osteoarthritis   . Hypertension   . Hyperlipidemia   . COPD (chronic obstructive pulmonary disease)   . CHF (congestive heart failure) diastolic 10/2010  . Pneumonia   . Impaired glucose tolerance   . Atrial fibrillation     Chronic  . Elevated prostate specific antigen (PSA)   . Cancer     Prostate  . Reflux     Past Surgical History  Procedure Date  . Left knee   .  Ulcer surgery   . Bladder surgery   . Mitral valve replacement 2003  . Coronary artery bypass graft   . Rca with stent replacement      1994  . Right eye surgery      2007  . Left sfa stent     Family History  Problem Relation Age of Onset  . Stroke Mother   . Stroke Brother   . Heart attack Brother   . Cancer Brother   . Cancer Brother     History  Substance Use Topics  . Smoking status: Former Smoker    Quit date: 06/24/2010  . Smokeless tobacco: Not on file  . Alcohol Use: No      Review of Systems  Constitutional: Positive for appetite change. Negative for fever and chills.  HENT: Positive for congestion.   Respiratory: Positive for shortness of breath and wheezing.   Cardiovascular: Negative for chest pain and leg swelling.  Psychiatric/Behavioral: Positive for confusion.  All other systems reviewed and are negative.  All other systems negative except as noted in HPI.   Physical Exam  BP 127/64  Pulse 96  Temp(Src) 98.4 F (36.9 C) (Oral)  Resp 22  Ht 6\' 2"  (1.88 m)  Wt 219 lb (99.338 kg)  BMI 28.12 kg/m2  SpO2 93%  Physical Exam CONSTITUTIONAL: Well developed/well nourished HEAD AND FACE: Normocephalic/atraumatic EYES: EOMI/PERRL ENMT: Mucous membranes moist NECK: supple no meningeal signs SPINE:entire spine nontender CV: S1/S2 noted, no murmurs/rubs/gallops noted LUNGS: mild wheezing bilaterally, no apparent distress ABDOMEN: soft, nontender, no rebound or guarding NEURO:  Pt is awake/alert, moves all extremitiesx4, no facial droop, Alert, Oriented to self, Mildly confused to date, no focal motor or sensory deficits.  EXTREMITIES: pulses normal, full ROM, cap refill intact, mild edema of LLE, DP and PT pulses intact bilaterally SKIN: warm, color normal PSYCH: no abnormalities of mood noted   ED Course  Procedures  MDM Nursing notes reviewed and considered in documentation xrays reviewed and considered All labs/vitals reviewed and  considered  Worsening hypercarbia, will try bipap Pt admitted for resp failure earlier this year 1:29 PM-Patient and family informed of probable admission.  D/w dr Karilyn Cota, will see patient in ED    Date: 01/18/2011  Rate: 90  Rhythm: atrial fibrillation  QRS Axis: normal  Intervals: normal  ST/T Wave abnormalities: nonspecific ST changes  Conduction Disutrbances:nonspecific intraventricular conduction delay  Narrative Interpretation:   Old EKG Reviewed: unchanged   Dg Chest 2 View  01/18/2011  *RADIOLOGY REPORT*  Clinical Data: Shortness of breath, facial and leg swelling.  CHEST - 2 VIEW  Comparison: 10/31/2010  Findings: Prior CABG.  There is cardiomegaly with vascular congestion.  Bibasilar atelectasis and small effusions.  No overt edema.  No acute bony abnormality.  IMPRESSION: Cardiomegaly, vascular congestion.  Bibasilar atelectasis with small effusions.  Original Report Authenticated By: Cyndie Chime, M.D.   Results for orders placed during the hospital encounter of 01/18/11  CBC      Component Value Range   WBC 8.4  4.0 - 10.5 (K/uL)   RBC 3.77 (*) 4.22 - 5.81 (MIL/uL)   Hemoglobin 12.3 (*) 13.0 - 17.0 (g/dL)   HCT 08.6  57.8 - 46.9 (%)   MCV 103.4 (*) 78.0 - 100.0 (fL)   MCH 32.6  26.0 - 34.0 (pg)   MCHC 31.5  30.0 - 36.0 (g/dL)   RDW 62.9 (*) 52.8 - 15.5 (%)   Platelets 165  150 - 400 (K/uL)  BASIC METABOLIC PANEL      Component Value Range   Sodium 142  135 - 145 (mEq/L)   Potassium 3.9  3.5 - 5.1 (mEq/L)   Chloride 104  96 - 112 (mEq/L)   CO2 30  19 - 32 (mEq/L)   Glucose, Bld 119 (*) 70 - 99 (mg/dL)   BUN 23  6 - 23 (mg/dL)   Creatinine, Ser 4.13  0.50 - 1.35 (mg/dL)   Calcium 8.7  8.4 - 24.4 (mg/dL)   GFR calc non Af Amer 58 (*) >60 (mL/min)   GFR calc Af Amer >60  >60 (mL/min)  BLOOD GAS, ARTERIAL      Component Value Range   O2 Content, Ven 2.0     Delivery systems NASAL CANNULA     pH, Arterial 7.331 (*) 7.350 - 7.450    pCO2 58.2 (*) 35.0 -  45.0 (mmHg)   pO2, Arterial 74.6 (*) 80.0 - 100.0 (mmHg)   Bicarbonate 29.9 (*) 20.0 - 24.0 (mEq/L)   TCO2 27.4  0 - 100 (mmol/L)   Acid-Base Excess 4.4 (*) 0.0 - 2.0 (mmol/L)   O2 Saturation 91.5     Patient temperature 37.0     Collection site RIGHT BRACHIAL     Drawn by COLLECTED BY RT     Sample type ARTERIAL     Allens test (pass/fail) NOT INDICATED (*) PASS   CARDIAC PANEL(CRET KIN+CKTOT+MB+TROPI)      Component Value Range   Total CK 165  7 - 232 (U/L)   CK, MB PENDING  0.3 - 4.0 (ng/mL)  Troponin I PENDING  <0.30 (ng/mL)   Relative Index PENDING  0.0 - 2.5     Chart written by Clarita Crane acting as scribe for Joya Gaskins, MD  I personally performed the services described in this documentation, which was scribed in my presence. The recorded information has been reviewed and considered. Joya Gaskins, MD    Joya Gaskins, MD 01/18/11 1352

## 2011-01-18 NOTE — ED Notes (Signed)
No change from previous assessment. Pt remains A&Ox3. Currently on Bipap. Skin w/p/d. Continues to deny pain. Awaiting call from cardiology

## 2011-01-19 LAB — COMPREHENSIVE METABOLIC PANEL
ALT: 16 U/L (ref 0–53)
AST: 36 U/L (ref 0–37)
Albumin: 3.5 g/dL (ref 3.5–5.2)
CO2: 28 mEq/L (ref 19–32)
Calcium: 8.9 mg/dL (ref 8.4–10.5)
Creatinine, Ser: 0.89 mg/dL (ref 0.50–1.35)
GFR calc non Af Amer: >60 mL/min (ref >60)
Sodium: 145 mEq/L (ref 135–145)
Total Protein: 7.3 g/dL (ref 6.0–8.3)

## 2011-01-19 LAB — CARDIAC PANEL(CRET KIN+CKTOT+MB+TROPI)
CK, MB: 8.7 ng/mL (ref 0.3–4.0)
Relative Index: 7.7 — ABNORMAL HIGH (ref 0.0–2.5)
Relative Index: INVALID (ref 0.0–2.5)
Total CK: 113 U/L (ref 7–232)
Total CK: 96 U/L (ref 7–232)
Troponin I: 2.93 ng/mL (ref <0.30)

## 2011-01-19 LAB — CBC
HCT: 38.2 % — ABNORMAL LOW (ref 39.0–52.0)
Hemoglobin: 11.9 g/dL — ABNORMAL LOW (ref 13.0–17.0)
MCH: 31.9 pg (ref 26.0–34.0)
RBC: 3.73 MIL/uL — ABNORMAL LOW (ref 4.22–5.81)

## 2011-01-19 LAB — BLOOD GAS, ARTERIAL
O2 Content: 3 L/min
O2 Saturation: 91.3 %
Patient temperature: 37
pH, Arterial: 7.379 (ref 7.350–7.450)
pO2, Arterial: 70.3 mmHg — ABNORMAL LOW (ref 80.0–100.0)

## 2011-01-19 MED ORDER — PREDNISONE 20 MG PO TABS
20.0000 mg | ORAL_TABLET | Freq: Every day | ORAL | Status: DC
  Administered 2011-01-19 – 2011-01-20 (×2): 20 mg via ORAL
  Filled 2011-01-19: qty 1

## 2011-01-19 MED ORDER — FUROSEMIDE 40 MG PO TABS
40.0000 mg | ORAL_TABLET | Freq: Every day | ORAL | Status: DC
  Administered 2011-01-19 – 2011-01-20 (×2): 40 mg via ORAL
  Filled 2011-01-19 (×2): qty 1

## 2011-01-19 NOTE — Progress Notes (Signed)
Subjective: This man has clearly improved. He feels back to his baseline in terms of his respiration. He was able to come off BiPAP fairly quickly yesterday evening. He is now on 3 L oxygen via nasal cannula. Serial cardiac enzymes were elevated indicative of a non-ST elevation MI.           Physical Exam: The vital signs EXB:MWUX:  [97.6 F (36.4 C)-98.4 F (36.9 C)] 97.8 F (36.6 C) (07/28 0400) Pulse Rate:  [79-98] 81  (07/28 0600) Resp:  [13-26] 18  (07/28 0600) BP: (111-151)/(60-96) 133/71 mmHg (07/28 0600) SpO2:  [86 %-97 %] 86 % (07/28 0600) Weight:  [99.338 kg (219 lb)-109 kg (240 lb 4.8 oz)] 237 lb 7 oz (107.7 kg) (07/28 0500) He looks systemically well and he does not appear to have increased work of breathing. There is no peripheral or central cyanosis. Lung fields show inspiratory basal crackles at both bases. He is alert and orientated and really does not appear to be confused whatsoever.   Investigations: Results for orders placed during the hospital encounter of 01/18/11 (from the past 24 hour(s))  CBC     Status: Abnormal   Collection Time   01/18/11 12:16 PM      Component Value Range   WBC 8.4  4.0 - 10.5 (K/uL)   RBC 3.77 (*) 4.22 - 5.81 (MIL/uL)   Hemoglobin 12.3 (*) 13.0 - 17.0 (g/dL)   HCT 32.4  40.1 - 02.7 (%)   MCV 103.4 (*) 78.0 - 100.0 (fL)   MCH 32.6  26.0 - 34.0 (pg)   MCHC 31.5  30.0 - 36.0 (g/dL)   RDW 25.3 (*) 66.4 - 15.5 (%)   Platelets 165  150 - 400 (K/uL)  BASIC METABOLIC PANEL     Status: Abnormal   Collection Time   01/18/11 12:16 PM      Component Value Range   Sodium 142  135 - 145 (mEq/L)   Potassium 3.9  3.5 - 5.1 (mEq/L)   Chloride 104  96 - 112 (mEq/L)   CO2 30  19 - 32 (mEq/L)   Glucose, Bld 119 (*) 70 - 99 (mg/dL)   BUN 23  6 - 23 (mg/dL)   Creatinine, Ser 4.03  0.50 - 1.35 (mg/dL)   Calcium 8.7  8.4 - 47.4 (mg/dL)   GFR calc non Af Amer 58 (*) >60 (mL/min)   GFR calc Af Amer >60  >60 (mL/min)  CARDIAC PANEL(CRET  KIN+CKTOT+MB+TROPI)     Status: Abnormal   Collection Time   01/18/11 12:16 PM      Component Value Range   Total CK 165  7 - 232 (U/L)   CK, MB 12.8 (*) 0.3 - 4.0 (ng/mL)   Troponin I 4.76 (*) <0.30 (ng/mL)   Relative Index 7.8 (*) 0.0 - 2.5   BLOOD GAS, ARTERIAL     Status: Abnormal   Collection Time   01/18/11  1:00 PM      Component Value Range   O2 Content, Ven 2.0     Delivery systems NASAL CANNULA     pH, Arterial 7.331 (*) 7.350 - 7.450    pCO2 58.2 (*) 35.0 - 45.0 (mmHg)   pO2, Arterial 74.6 (*) 80.0 - 100.0 (mmHg)   Bicarbonate 29.9 (*) 20.0 - 24.0 (mEq/L)   TCO2 27.4  0 - 100 (mmol/L)   Acid-Base Excess 4.4 (*) 0.0 - 2.0 (mmol/L)   O2 Saturation 91.5     Patient temperature 37.0  Collection site RIGHT BRACHIAL     Drawn by COLLECTED BY RT     Sample type ARTERIAL     Allens test (pass/fail) NOT INDICATED (*) PASS   MRSA PCR SCREENING     Status: Normal   Collection Time   01/18/11  3:20 PM      Component Value Range   MRSA by PCR NEGATIVE  NEGATIVE   GLUCOSE, CAPILLARY     Status: Abnormal   Collection Time   01/18/11  4:03 PM      Component Value Range   Glucose-Capillary 104 (*) 70 - 99 (mg/dL)   Comment 1 Notify RN     Comment 2 Documented in Chart    CARDIAC PANEL(CRET KIN+CKTOT+MB+TROPI)     Status: Abnormal   Collection Time   01/18/11  4:25 PM      Component Value Range   Total CK 141  7 - 232 (U/L)   CK, MB 11.1 (*) 0.3 - 4.0 (ng/mL)   Troponin I 4.77 (*) <0.30 (ng/mL)   Relative Index 7.9 (*) 0.0 - 2.5   CARDIAC PANEL(CRET KIN+CKTOT+MB+TROPI)     Status: Abnormal   Collection Time   01/19/11 12:01 AM      Component Value Range   Total CK 113  7 - 232 (U/L)   CK, MB 8.7 (*) 0.3 - 4.0 (ng/mL)   Troponin I 2.93 (*) <0.30 (ng/mL)   Relative Index 7.7 (*) 0.0 - 2.5   COMPREHENSIVE METABOLIC PANEL     Status: Abnormal   Collection Time   01/19/11  5:13 AM      Component Value Range   Sodium 145  135 - 145 (mEq/L)   Potassium 4.4  3.5 - 5.1 (mEq/L)     Chloride 104  96 - 112 (mEq/L)   CO2 28  19 - 32 (mEq/L)   Glucose, Bld 168 (*) 70 - 99 (mg/dL)   BUN 23  6 - 23 (mg/dL)   Creatinine, Ser 2.84  0.50 - 1.35 (mg/dL)   Calcium 8.9  8.4 - 13.2 (mg/dL)   Total Protein 7.3  6.0 - 8.3 (g/dL)   Albumin 3.5  3.5 - 5.2 (g/dL)   AST 36  0 - 37 (U/L)   ALT 16  0 - 53 (U/L)   Alkaline Phosphatase 95  39 - 117 (U/L)   Total Bilirubin 0.5  0.3 - 1.2 (mg/dL)   GFR calc non Af Amer >60  >60 (mL/min)   GFR calc Af Amer >60  >60 (mL/min)  CBC     Status: Abnormal   Collection Time   01/19/11  5:13 AM      Component Value Range   WBC 4.5  4.0 - 10.5 (K/uL)   RBC 3.73 (*) 4.22 - 5.81 (MIL/uL)   Hemoglobin 11.9 (*) 13.0 - 17.0 (g/dL)   HCT 44.0 (*) 10.2 - 52.0 (%)   MCV 102.4 (*) 78.0 - 100.0 (fL)   MCH 31.9  26.0 - 34.0 (pg)   MCHC 31.2  30.0 - 36.0 (g/dL)   RDW 72.5  36.6 - 44.0 (%)   Platelets 148 (*) 150 - 400 (K/uL)  BLOOD GAS, ARTERIAL     Status: Abnormal   Collection Time   01/19/11  7:10 AM      Component Value Range   O2 Content, Ven 3.0     pH, Arterial 7.379  7.350 - 7.450    pCO2 51.6 (*) 35.0 - 45.0 (mmHg)   pO2,  Arterial 70.3 (*) 80.0 - 100.0 (mmHg)   Bicarbonate 29.8 (*) 20.0 - 24.0 (mEq/L)   TCO2 26.8  0 - 100 (mmol/L)   Acid-Base Excess 4.9 (*) 0.0 - 2.0 (mmol/L)   O2 Saturation 91.3     Patient temperature 37.0     Collection site LEFT RADIAL     Drawn by COLLECTED BY RT     Sample type ARTERIAL     Allens test (pass/fail) PASS  PASS    Recent Results (from the past 240 hour(s))  MRSA PCR SCREENING     Status: Normal   Collection Time   01/18/11  3:20 PM      Component Value Range Status Comment   MRSA by PCR NEGATIVE  NEGATIVE  Final        Medications: I have reviewed the patient's current medications.  Impression: 1. Acute on chronic respiratory failure, improved. His PCO2 also is improving. 2. Chronic atrial fibrillation, ventricular rate controlled. He is not a Coumadin candidate due to a previous  history of GI bleeding. 3. Non-ST elevation myocardial infarction. He is not a candidate for an invasive aggressive therapy. 4. COPD with mild exacerbation. 5. Diastolic congestive heart failure.      Plan: 1. Convert intravenous Lasix to by mouth Lasix. 2. Discontinue intravenous steroids and switched to oral steroids. 3. With the patient to telemetry. 4. Hopefully, he can be discharged in the next 1-2 days.     LOS: 1 day   GOSRANI,NIMISH C 01/19/2011, 8:11 AM

## 2011-01-20 MED ORDER — FUROSEMIDE 20 MG PO TABS: 20.0000 mg | ORAL_TABLET | Freq: Every day | ORAL | Status: DC

## 2011-01-20 MED ORDER — PREDNISONE 20 MG PO TABS: 20.0000 mg | ORAL_TABLET | Freq: Every day | ORAL | Status: AC

## 2011-01-20 MED ORDER — LEVOFLOXACIN 500 MG PO TABS: 500.0000 mg | ORAL_TABLET | Freq: Every day | ORAL | Status: AC

## 2011-01-20 NOTE — Discharge Summary (Addendum)
Physician Discharge Summary  Patient ID: [name] MRN: [id number] DOB/AGE: [birth year] 77 y.o. Primary Care Physician:Margaret Lodema Hong, MD, MD Admit date: 01/18/2011 Discharge date: 01/20/2011    Discharge Diagnoses:  1. Acute on chronic respiratory failure, secondary to a combination of COPD exacerbation and diastolic congestive heart failure, improved. 2. Non-ST elevation myocardial infarction, not a candidate for further aggressive intervention. 3. Chronic atrial fibrillation, not a candidate for anticoagulation due to previous GI and GU bleeding. 4. Hypertension, controlled. Principal Problem:     Current Discharge Medication List    START taking these medications   Details  furosemide (LASIX) 20 MG tablet Take 1 tablet (20 mg total) by mouth daily. Qty: 30 tablet, Refills: 0    levofloxacin (LEVAQUIN) 500 MG tablet Take 1 tablet (500 mg total) by mouth daily. Qty: 5 tablet, Refills: 0    predniSONE (DELTASONE) 20 MG tablet Take 1 tablet (20 mg total) by mouth daily. Qty: 5 tablet, Refills: 0      CONTINUE these medications which have NOT CHANGED   Details  allopurinol (ZYLOPRIM) 300 MG tablet take 1 tablet by mouth once daily Qty: 30 tablet, Refills: 1    aspirin 81 MG EC tablet Take 81 mg by mouth daily.      clopidogrel (PLAVIX) 75 MG tablet Take 75 mg by mouth daily.      Fluticasone-Salmeterol (ADVAIR DISKUS) 100-50 MCG/DOSE AEPB One puff twice daily Qty: 60 each, Refills: 2    HYDROcodone-acetaminophen (VICODIN) 5-500 MG per tablet Take 1-2 tablets by mouth every 4 (four) hours as needed. pain    ipratropium (ATROVENT) 0.02 % nebulizer solution Take 500 mcg by nebulization every 6 (six) hours.     levalbuterol (XOPENEX) 1.25 MG/0.5ML nebulizer solution Take 1 ampule by nebulization every 6 (six) hours as needed. For shortness of breath    lovastatin (MEVACOR) 40 MG tablet Take 40 mg by mouth as directed. On Sunday, Monday, Wednesday & friday      metoprolol (LOPRESSOR) 50 MG tablet Take 25 mg by mouth 2 (two) times daily.     multivitamin (THERAGRAN) per tablet Take 1 tablet by mouth daily.      omeprazole (PRILOSEC) 40 MG capsule take 1 capsule by mouth once daily 1 capsule by mouth once daily Qty: 30 capsule, Refills: 1    Benzonatate (TESSALON PERLES PO) Take 1 tablet by mouth 3 (three) times daily.      potassium chloride SA (KLOR-CON M20) 20 MEQ tablet Take 20 mEq by mouth daily.      Skin Protectants, Misc. [facility] SKIN PROTECTANT) 50 % OINT Apply to affected area as needed Qty: 400 g, Refills: 1    traMADol (ULTRAM) 50 MG tablet take 1 tablet by mouth twice a day Qty: 40 tablet, Refills: 2      STOP taking these medications     gabapentin (NEURONTIN) 300 MG capsule            benazepril (LOTENSIN) 20 MG tablet      penicillin v potassium (VEETID) 500 MG tablet         Discharged Condition: Stable and improved.    Consults: None.  Significant Diagnostic Studies: Dg Chest 2 View  01/18/2011  *RADIOLOGY REPORT*  Clinical Data: Shortness of breath, facial and leg swelling.  CHEST - 2 VIEW  Comparison: 10/31/2010  Findings: Prior CABG.  There is cardiomegaly with vascular congestion.  Bibasilar atelectasis and small effusions.  No overt edema.  No acute bony abnormality.  IMPRESSION:  Cardiomegaly, vascular congestion.  Bibasilar atelectasis with small effusions.  Original Report Authenticated By: Cyndie Chime, M.D.   Mr Pelvis W Wo Contrast  01/06/2011  *RADIOLOGY REPORT*  Clinical Data:  Renal mass seen on CT angiogram.  Abdominal aortic aneurysm.  History prostate cancer.  MRI ABDOMEN AND PELVIS WITHOUT AND WITH CONTRAST  Technique:  Multiplanar multisequence MR imaging of the abdomen and pelvis was performed both before and after the administration of intravenous contrast.  Contrast: 20 ml Multihance  Comparison:  08/03/2008; 12/24/2010  MRI ABDOMEN  Findings:  Levoconvex lumbar scoliosis noted.  The  gallbladder is half filled with what appears to be a combination of sludge and small gallstones.  The 1.7 x 1.4 cm lesion of concern along the right lateral kidney does not enhance and has intermediate high T2 signal characteristics and intermediate to low T1 signal characteristics.  Multiple additional bilateral simple appearing renal cysts are noted.  Visualized portions of the liver, spleen, and adrenal glands appear normal.  No abnormal pancreatic enhancement identified.  The fusiform infrarenal abdominal aortic aneurysm is again noted and appears unchanged from the recent CT day.  IMPRESSION:  1.  The hypodense right exophytic renal lesion described on the prior CT scan represents a Bosniak category II cyst (complex, but benign.).  Additional simple appearing renal cysts are present bilaterally. 2.  Unchanged infrarenal abdominal aortic aneurysm. 2.  Numerous layering gallstones. 3.  Lumbar scoliosis and spondylosis.  MRI PELVIS  Findings: There is prominence of the prostate gland which indents the urinary bladder.  The prominent and somewhat irregular prostatic urethra may represent prior transurethral resection of the prostate.  On T2-weighted images, the characteristic zonal anatomy of the prostate is poorly seen - the usually hyperintense peripheral zone is not well appreciated.  No pathologic pelvic adenopathy is identified.  No free pelvic fluid identified.  Lower lumbar spondylosis and degenerative disc disease are present, with mild foraminal impingement suspected bilaterally at L3-4 L4-5.  No compelling findings of prostate metastatic disease.  Several tiny bone islands are present in the hips.  IMPRESSION:  1. Suspected prostatic tissue indents the bladder base, with irregularity of the prostatic margin of the prostatic urethra suggesting prior transurethral resection of prostate.  Prostate zonal anatomy is difficult to assess this today's exam was not a dedicated prostate MRI. If the patient has  rising PSA level or other concerns for recurrent prostate cancer, then biopsy may be indicated. 2.  Lower lumbar spondylosis with foraminal narrowing.  Original Report Authenticated By: Dellia Cloud, M.D.   Mr Abdomen W Wo Contrast  01/06/2011  *RADIOLOGY REPORT*  Clinical Data:  Renal mass seen on CT angiogram.  Abdominal aortic aneurysm.  History prostate cancer.  MRI ABDOMEN AND PELVIS WITHOUT AND WITH CONTRAST  Technique:  Multiplanar multisequence MR imaging of the abdomen and pelvis was performed both before and after the administration of intravenous contrast.  Contrast: 20 ml Multihance  Comparison:  08/03/2008; 12/24/2010  MRI ABDOMEN  Findings:  Levoconvex lumbar scoliosis noted.  The gallbladder is half filled with what appears to be a combination of sludge and small gallstones.  The 1.7 x 1.4 cm lesion of concern along the right lateral kidney does not enhance and has intermediate high T2 signal characteristics and intermediate to low T1 signal characteristics.  Multiple additional bilateral simple appearing renal cysts are noted.  Visualized portions of the liver, spleen, and adrenal glands appear normal.  No abnormal pancreatic enhancement identified.  The fusiform infrarenal  abdominal aortic aneurysm is again noted and appears unchanged from the recent CT day.  IMPRESSION:  1.  The hypodense right exophytic renal lesion described on the prior CT scan represents a Bosniak category II cyst (complex, but benign.).  Additional simple appearing renal cysts are present bilaterally. 2.  Unchanged infrarenal abdominal aortic aneurysm. 2.  Numerous layering gallstones. 3.  Lumbar scoliosis and spondylosis.  MRI PELVIS  Findings: There is prominence of the prostate gland which indents the urinary bladder.  The prominent and somewhat irregular prostatic urethra may represent prior transurethral resection of the prostate.  On T2-weighted images, the characteristic zonal anatomy of the prostate is  poorly seen - the usually hyperintense peripheral zone is not well appreciated.  No pathologic pelvic adenopathy is identified.  No free pelvic fluid identified.  Lower lumbar spondylosis and degenerative disc disease are present, with mild foraminal impingement suspected bilaterally at L3-4 L4-5.  No compelling findings of prostate metastatic disease.  Several tiny bone islands are present in the hips.  IMPRESSION:  1. Suspected prostatic tissue indents the bladder base, with irregularity of the prostatic margin of the prostatic urethra suggesting prior transurethral resection of prostate.  Prostate zonal anatomy is difficult to assess this today's exam was not a dedicated prostate MRI. If the patient has rising PSA level or other concerns for recurrent prostate cancer, then biopsy may be indicated. 2.  Lower lumbar spondylosis with foraminal narrowing.  Original Report Authenticated By: Dellia Cloud, M.D.   Ct Angio Abd/pel W/ And/or W/o  12/24/2010  *RADIOLOGY REPORT*  Clinical Data: Abdominal aortic aneurysm  CT ANGIOGRAPHY OF ABDOMEN AND PELVIS - PRESTENT PROTOCOL  Technique:  Multidetector CT imaging of the abdomen and pelvis was performed during bolus injection of intravenous contrast. Multiplanar CT angiographic image reconstructions including MIPs were also generated to evaluate the vascular anatomy.  Contrast:  125 ml Omnipaque 350  Comparison:  08/03/2008  Findings: Cholelithiasis.  Umbilical hernia containing adipose tissue.  Liver, spleen, adrenal glands, pancreas are within normal limits.  Several simple cysts are present in the kidneys.  There are some hypodensities are too small to characterize.  There is an hyperdense 1.5 cm lesion in the right kidney on image 66 of series 5 and image 22 of series 7.  No free fluid.  No abnormal adenopathy.  Multilevel degenerative disc disease in the lumbar spine is present.  Length of infrarenal neck (from lowest renal artery): 3.6 cm Number of renal  arteries:  Right = one; Left = one Diameter of infrarenal neck:  23 mm Total length of aneurysm:  10.9 cm Aneurysm ends at aortic bifurcation: Yes  If no, Distance from aneurysm to bifurcation: Greatest aneurysm diameter:  4.7 cmGreatest common iliac artery diameters:  Right =1.8 cm; Left = 1.8 cm Diameter of common iliac arteries just above iliac bifurcation: Right = 1.4 cm; Left = 1.8 cm Length of common iliac arteries:  Right = 6.6 cm; Left = 5.6 cm   Review of the MIP images confirms the above findings.  IMPRESSION: Abdominal aortic aneurysm as described.  Maximal diameter is 4.7 cm.  Hyperdense lesion in the right kidney as described. This may be an enhancing lesion.  MRI is recommended to further characterize. Renal cell carcinoma is not excluded.  Cholelithiasis.  Original Report Authenticated By: Donavan Burnet, M.D.    Lab Results: No results found for this or any previous visit (from the past 24 hour(s)). Recent Results (from the past 240 hour(s))  MRSA PCR SCREENING     Status: Normal   Collection Time   01/18/11  3:20 PM      Component Value Range Status Comment   MRSA by PCR NEGATIVE  NEGATIVE  Final      Hospital Course: This very pleasant 77 year old man was admitted with increasing confusion and dyspnea. Please see initial history and physical examination. Apparently, he had been without his own home oxygen for lease 6 hours at home and this led to his admission. However, also he had discontinued his oral Lasix approximately 6 weeks ago when he had preceded his left leg for peripheral vascular disease. During this hospitalization he was initially admitted to the intensive care unit with BiPAP machine. However, this was quickly weaned off. He was started on intravenous steroids and antibiotics as well as intravenous Lasix. He had a good response to the above treatment. He was then moved to the regular floor and he has been doing well there and stabilized. His arterial blood gas the day  after admission showed an improvement with decrease in his PCO2. He is not confused anymore and back to his usual self in terms of his breathing. He still requires oxygen. Chest x-ray did not show any evidence of pneumonia, possibly there was evidence of a degree of congestive heart failure.  Discharge Exam: Blood pressure 123/68, pulse 78, temperature 97.4 F (36.3 C), temperature source Oral, resp. rate 20, height 6\' 3"  (1.905 m), weight 106.9 kg (235 lb 10.8 oz), SpO2 95.00%. He is doing well today. Heart sounds are present and in atrial fibrillation as before. Lung fields are entirely clear without any evidence of wheezing or crackles today. He is alert and oriented without any focal neurological signs.  Disposition: Home. It is important that he have his renal function checked by his primary care physician on his next appointment, which I believe is in the next few days. I would think it would be important for him to be maintained on a small dose of oral Lasix.  Discharge Orders    Future Appointments: Provider: Department: Dept Phone: Center:   01/22/2011 10:45 AM [name], MD [facility] [phone] [facility]   04/03/2011 10:00 AM [facility] [phone] [facility]   04/03/2011 10:30 AM [facility] [phone] [facility]   06/28/2011 10:30 AM [facility] [phone] None   07/05/2011 10:30 AM Randall An, MD [facility] [phone] None   07/08/2011 10:00 AM [facility] [phone] [facility]   07/08/2011 10:45 AM Nada Libman [facility] [phone] [facility]     Future Orders Please Complete By Expires   Diet - low sodium heart healthy      Increase activity slowly      Discharge instructions      Comments:   Please have your primary care physician check blood work for kidney function on your next appointment.      Follow-up Information    Follow up with [name], MD. Make an appointment in 3 days.   Contact  information:   [address], [address] [place] [address] [phone]         Please note: The patient has been to continue with aspirin daily. The patient will take prednisone for 5 days without tapering. The patient will finish his course of Levaquin. The patient will continue home medications of tramadol and Prilosec.  SignedWilson Singer 01/20/2011, 8:44 AM

## 2011-01-22 ENCOUNTER — Ambulatory Visit (INDEPENDENT_AMBULATORY_CARE_PROVIDER_SITE_OTHER): Payer: Medicare Other | Admitting: Family Medicine

## 2011-01-22 VITALS — BP 120/70 | HR 70 | Resp 14 | Wt 237.0 lb

## 2011-01-22 DIAGNOSIS — I5030 Unspecified diastolic (congestive) heart failure: Secondary | ICD-10-CM

## 2011-01-22 DIAGNOSIS — M1990 Unspecified osteoarthritis, unspecified site: Secondary | ICD-10-CM

## 2011-01-22 DIAGNOSIS — I509 Heart failure, unspecified: Secondary | ICD-10-CM

## 2011-01-22 DIAGNOSIS — J449 Chronic obstructive pulmonary disease, unspecified: Secondary | ICD-10-CM

## 2011-01-22 DIAGNOSIS — I10 Essential (primary) hypertension: Secondary | ICD-10-CM

## 2011-01-22 DIAGNOSIS — C61 Malignant neoplasm of prostate: Secondary | ICD-10-CM

## 2011-01-22 MED ORDER — HYDROCODONE-ACETAMINOPHEN 5-500 MG PO TABS: 1.0000 | ORAL_TABLET | Freq: Every day | ORAL | Status: DC

## 2011-01-22 NOTE — Patient Instructions (Signed)
F/u in 6 weeks.  Medications per discharge instructions.   The pain pill is only one at night

## 2011-01-22 NOTE — Progress Notes (Signed)
  Subjective:    Patient ID: [name], male    DOB: May 22, 1922, 77 y.o.   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 Renal mass seen, will have urology review the report at the request of both patient and vascular    Gabapentin made him jerk, this was std by dr Cecelia Byars, vascular, he has since discontinued this. Recently hospitalized, 7/27 to 7/29  With acute respiratory failure due to cOPD exaccerbation and CHF. Still somewhat weak due to multiple recent hospitalizations, denies significant dyspnea, pND or leg swelling. No cough, fever or chills   Review of Systems See HPI Denies recent fever or chills. Denies sinus pressure, nasal congestion, ear pain or sore throat. Denies chest congestion, productive cough or wheezing. Denies chest pains, palpitations and leg swelling Denies abdominal pain, nausea, vomiting,diarrhea or constipation.   Denies dysuria, frequency, hesitancy or incontinence. Chronic back and knee pani with reduced mobility Denies headaches, seizures, numbness, or tingling. Denies depression, anxiety or insomnia. Denies skin break down or rash.        Objective:   Physical Exam Patient alert and in no cardiopulmonary distress.  HEENT: No facial asymmetry, EOMI, no sinus tenderness,  oropharynx pink and moist.  Neck decreased  no adenopathy no JVD  Chest: Clear to auscultation bilaterally.Decreased air entry bilaterally, few bibasilar crackles CVS: S1, S2 systolic  murmur, no S3.  ABD: Soft non tender. Bowel sounds normal.  Ext: one plus pitting edema  MS: decreased ROM spine, shoulders, hips and knees.  Skin: Intact, no ulcerations or rash noted.Multiple tatoos  Psych: Good eye contact, normal affect. Memory intact not anxious or depressed appearing.  CNS: CN 2-12 intact, power, tone and sensation normal throughout.        Assessment & Plan:    No problem-specific assessment & plan notes found for this encounter.

## 2011-01-23 ENCOUNTER — Other Ambulatory Visit: Payer: Self-pay

## 2011-01-23 MED ORDER — HYDROCODONE-ACETAMINOPHEN 5-500 MG PO TABS: 1.0000 | ORAL_TABLET | Freq: Every day | ORAL | Status: DC

## 2011-01-25 ENCOUNTER — Other Ambulatory Visit: Payer: Self-pay | Admitting: Family Medicine

## 2011-01-29 ENCOUNTER — Telehealth: Payer: Self-pay | Admitting: Family Medicine

## 2011-01-30 NOTE — Telephone Encounter (Signed)
Called [name], left message

## 2011-01-31 ENCOUNTER — Telehealth: Payer: Self-pay | Admitting: Family Medicine

## 2011-01-31 NOTE — Telephone Encounter (Signed)
pls call pt's daughter, let her know I received the report of the MRI of her Dads abdomen, and am sending this to dr Rito Ehrlich. The prostate looks enlarged ( not new), however if she wants to further discuss any recommendations with Dr Rito Ehrlich about this she can call for an appt. I am also requesting that he contact her if he thinks a sooner appt is needed.  Pls fax the report and my note, in your folder

## 2011-02-01 ENCOUNTER — Other Ambulatory Visit: Payer: Self-pay | Admitting: Family Medicine

## 2011-02-01 MED ORDER — POTASSIUM CHLORIDE CRYS ER 20 MEQ PO TBCR: 20.0000 meq | EXTENDED_RELEASE_TABLET | Freq: Every day | ORAL | Status: DC

## 2011-02-01 MED ORDER — LEVALBUTEROL HCL 1.25 MG/0.5ML IN NEBU: 1.0000 | INHALATION_SOLUTION | Freq: Four times a day (QID) | RESPIRATORY_TRACT | Status: DC | PRN

## 2011-02-01 MED ORDER — FUROSEMIDE 20 MG PO TABS: 20.0000 mg | ORAL_TABLET | Freq: Every day | ORAL | Status: DC

## 2011-02-01 MED ORDER — IPRATROPIUM BROMIDE 0.02 % IN SOLN: 500.0000 ug | Freq: Four times a day (QID) | RESPIRATORY_TRACT | Status: DC

## 2011-02-01 NOTE — Telephone Encounter (Signed)
meds sent in per Dr. Lodema Hong

## 2011-02-01 NOTE — Telephone Encounter (Signed)
Patient aware.

## 2011-02-01 NOTE — Telephone Encounter (Signed)
She said that she can't get anyone to refill her dad's lasix and potassium and he is almost out. Ok to send in refill? Also on the xopenex and atrovent for neb. And she was wanting to know if the MRI showed anymore info on what the cyst on his kidney was. She was told to call here for her pcp to answer her questions

## 2011-02-01 NOTE — Telephone Encounter (Signed)
pls refill the lasix and potassium x 2  And ipratoprium and xopenex x 4, ask pharmacy to send over the requests if not here, and refill as originally ordered pls I spoke to the daughter and patient about the other, no need to call either of them back

## 2011-02-04 NOTE — Progress Notes (Signed)
Encounter addended by: Wilson Singer on: 02/04/2011  4:00 PM<BR>     Documentation filed: Charting, Inpatient Notes

## 2011-02-04 NOTE — Progress Notes (Signed)
Encounter addended by: Wilson Singer on: 02/04/2011  3:54 PM<BR>     Documentation filed: Charting, Inpatient Notes

## 2011-02-12 ENCOUNTER — Encounter: Payer: Self-pay | Admitting: Family Medicine

## 2011-02-12 NOTE — Assessment & Plan Note (Signed)
Improved, following recent hospitalization

## 2011-02-12 NOTE — Assessment & Plan Note (Signed)
Unchanged, no falls , ambulates with a cane

## 2011-02-12 NOTE — Assessment & Plan Note (Signed)
Improved since recent flare requiring hospitalization

## 2011-02-12 NOTE — Assessment & Plan Note (Signed)
Followed by urology for over 5 years. Recent scan suggests the possibility of bladder wall invasion, urology ois aware, no change in management

## 2011-02-12 NOTE — Assessment & Plan Note (Signed)
Controlled, no change in medication  

## 2011-02-13 ENCOUNTER — Other Ambulatory Visit: Payer: Self-pay

## 2011-02-13 MED ORDER — FUROSEMIDE 20 MG PO TABS: 20.0000 mg | ORAL_TABLET | Freq: Every day | ORAL | Status: DC

## 2011-02-21 ENCOUNTER — Telehealth: Payer: Self-pay | Admitting: Family Medicine

## 2011-02-21 NOTE — Progress Notes (Signed)
Encounter addended by: Clarene Critchley on: 02/21/2011  7:52 AM<BR>     Documentation filed: Flowsheet VN

## 2011-02-27 NOTE — Telephone Encounter (Signed)
DAUGHTER CAME IN OFFICE YESTERDAY WITH CONCERNS ABOUT HIS BREATHING MEDICATION, HOWEVER THIS IS A MATTER BETWEEN INSURANCE AND PHARMACY

## 2011-03-01 NOTE — Telephone Encounter (Signed)
Since the insurance is not paying for the xopenex, [name] wants to know if they can cut back on using it so they can save them all they can. Instead of every 6 hrs, can he use it maybe once a day or twice?

## 2011-03-02 NOTE — Telephone Encounter (Signed)
Ok to use it twice daily as long as he is breathing fine

## 2011-03-04 NOTE — Telephone Encounter (Signed)
Daughter is aware 

## 2011-03-06 ENCOUNTER — Encounter: Payer: Self-pay | Admitting: Family Medicine

## 2011-03-07 ENCOUNTER — Encounter: Payer: Self-pay | Admitting: Family Medicine

## 2011-03-07 ENCOUNTER — Telehealth: Payer: Self-pay | Admitting: Family Medicine

## 2011-03-07 ENCOUNTER — Ambulatory Visit (INDEPENDENT_AMBULATORY_CARE_PROVIDER_SITE_OTHER): Payer: Medicare Other | Admitting: Family Medicine

## 2011-03-07 VITALS — BP 130/80 | HR 65 | Resp 16 | Ht 71.0 in | Wt 234.1 lb

## 2011-03-07 DIAGNOSIS — M1990 Unspecified osteoarthritis, unspecified site: Secondary | ICD-10-CM

## 2011-03-07 DIAGNOSIS — M79672 Pain in left foot: Secondary | ICD-10-CM | POA: Insufficient documentation

## 2011-03-07 DIAGNOSIS — E785 Hyperlipidemia, unspecified: Secondary | ICD-10-CM

## 2011-03-07 DIAGNOSIS — R7301 Impaired fasting glucose: Secondary | ICD-10-CM

## 2011-03-07 DIAGNOSIS — Z23 Encounter for immunization: Secondary | ICD-10-CM

## 2011-03-07 DIAGNOSIS — J449 Chronic obstructive pulmonary disease, unspecified: Secondary | ICD-10-CM

## 2011-03-07 DIAGNOSIS — M79609 Pain in unspecified limb: Secondary | ICD-10-CM

## 2011-03-07 DIAGNOSIS — E739 Lactose intolerance, unspecified: Secondary | ICD-10-CM

## 2011-03-07 DIAGNOSIS — I10 Essential (primary) hypertension: Secondary | ICD-10-CM

## 2011-03-07 MED ORDER — GABAPENTIN 100 MG PO CAPS: 100.0000 mg | ORAL_CAPSULE | Freq: Every day | ORAL | Status: DC

## 2011-03-07 MED ORDER — LOVASTATIN 40 MG PO TABS: 40.0000 mg | ORAL_TABLET | ORAL | Status: DC

## 2011-03-07 MED ORDER — METOPROLOL TARTRATE 50 MG PO TABS: 25.0000 mg | ORAL_TABLET | Freq: Two times a day (BID) | ORAL | Status: DC

## 2011-03-07 MED ORDER — INFLUENZA VAC TYPES A & B PF IM SUSP: 0.5000 mL | Freq: Once | INTRAMUSCULAR | Status: DC

## 2011-03-07 MED ORDER — POTASSIUM CHLORIDE CRYS ER 20 MEQ PO TBCR: 20.0000 meq | EXTENDED_RELEASE_TABLET | Freq: Every day | ORAL | Status: DC

## 2011-03-07 NOTE — Patient Instructions (Addendum)
F/U in 3 months.  Labs today, lipid, chem7 lipid, hepatic, HBa1C,   New medication for pain , take at bedtime, gabapentin  Flu vaccine today

## 2011-03-08 LAB — LIPID PANEL
Cholesterol: 155 mg/dL (ref 0–200)
LDL Cholesterol: 78 mg/dL (ref 0–99)
VLDL: 21 mg/dL (ref 0–40)

## 2011-03-08 LAB — BASIC METABOLIC PANEL
BUN: 15 mg/dL (ref 6–23)
Chloride: 103 mEq/L (ref 96–112)
Glucose, Bld: 97 mg/dL (ref 70–99)
Potassium: 4.5 mEq/L (ref 3.5–5.3)

## 2011-03-08 LAB — HEPATIC FUNCTION PANEL
Bilirubin, Direct: 0.2 mg/dL (ref 0.0–0.3)
Indirect Bilirubin: 0.4 mg/dL (ref 0.0–0.9)

## 2011-03-08 NOTE — Telephone Encounter (Signed)
All refills sent

## 2011-03-11 ENCOUNTER — Other Ambulatory Visit: Payer: Self-pay | Admitting: Family Medicine

## 2011-03-17 DIAGNOSIS — J441 Chronic obstructive pulmonary disease with (acute) exacerbation: Secondary | ICD-10-CM | POA: Insufficient documentation

## 2011-03-17 NOTE — Assessment & Plan Note (Signed)
Unchanged pt again cautioned re fall risk , and the need to be careful

## 2011-03-17 NOTE — Assessment & Plan Note (Signed)
Controlled, no change in medication  

## 2011-03-17 NOTE — Assessment & Plan Note (Signed)
Currently stable , continue maintenance meds

## 2011-03-17 NOTE — Progress Notes (Signed)
  Subjective:    Patient ID: [name], male    DOB: July 04, 1921, 77 y.o.   MRN: [id number]  HPI The PT is here for follow up and re-evaluation of chronic medical conditions, medication management and review of any available recent lab and radiology data.  Preventive health is updated, specifically  Cancer screening and Immunization.   Questions or concerns regarding consultations or procedures which the PT has had in the interim are  addressed. The PT denies any adverse reactions to current medications since the last visit.  There are no new concerns.       Review of Systems Denies recent fever or chills. Denies sinus pressure, nasal congestion, ear pain or sore throat. Denies chest congestion, productive cough or wheezing. Denies chest pains, palpitations and leg swelling Denies abdominal pain, nausea, vomiting,diarrhea or constipation.   Denies dysuria, frequency, hesitancy or incontinence. C/o chronic back and knee pain wih reduced mobility Denies headaches or  seizures, c/o numbness, and  Tingling in feet, had been on high dose of gabapentin in the past which he did not tolerate, wants a new lower dose Denies depression, anxiety or insomnia. Denies skin break down or rash.        Objective:   Physical Exam  Patient alert and oriented and in no cardiopulmonary distress.  HEENT: No facial asymmetry, EOMI, no sinus tenderness,  oropharynx pink and moist.  Neck supple no adenopathy.  Chest: Clear to auscultation bilaterally.Decreased air entry throughout  CVS: S1, S2  Positive for murmurs, no S3.  ABD: Soft non tender. Bowel sounds normal.  Ext: No edema  MS: Decreased ROM spine, shoulders, hips and knees.  Skin: Intact, no ulcerations or rash noted.  Psych: Good eye contact, normal affect. Memory mildly impaired, not anxious or depressed appearing.  CNS: CN 2-12 intact, power, tone and sensation normal throughout.       Assessment & Plan:

## 2011-03-21 LAB — BASIC METABOLIC PANEL
BUN: 6
CO2: 29
CO2: 30
Calcium: 9.1
Chloride: 103
Chloride: 105
Creatinine, Ser: 0.87
Creatinine, Ser: 0.95
GFR calc non Af Amer: >60
Glucose, Bld: 115 — ABNORMAL HIGH
Potassium: 4.6
Sodium: 139
Sodium: 141

## 2011-03-21 LAB — CBC
HCT: 36.3 — ABNORMAL LOW
Hemoglobin: 10.5 — ABNORMAL LOW
Hemoglobin: 11 — ABNORMAL LOW
MCHC: 30.8
MCHC: 30.9
MCV: 71 — ABNORMAL LOW
Platelets: 207
RDW: 19.3 — ABNORMAL HIGH
RDW: 19.3 — ABNORMAL HIGH
WBC: 11.1 — ABNORMAL HIGH

## 2011-03-21 LAB — DIFFERENTIAL
Basophils Absolute: 0.1
Basophils Relative: 1
Basophils Relative: 1
Eosinophils Absolute: 0.3
Eosinophils Relative: 1
Eosinophils Relative: 3
Lymphocytes Relative: 7 — ABNORMAL LOW
Lymphs Abs: 0.8
Monocytes Absolute: 1.1 — ABNORMAL HIGH
Monocytes Absolute: 1.1 — ABNORMAL HIGH
Monocytes Relative: 10
Neutro Abs: 6.9
Neutrophils Relative %: 74

## 2011-03-21 LAB — URINE MICROSCOPIC-ADD ON

## 2011-03-21 LAB — URINALYSIS, ROUTINE W REFLEX MICROSCOPIC
Glucose, UA: 100 — AB
Protein, ur: >300 — AB
Specific Gravity, Urine: 1.02
Urobilinogen, UA: >8 — ABNORMAL HIGH

## 2011-03-21 LAB — APTT: aPTT: 32

## 2011-03-21 LAB — URINE CULTURE

## 2011-03-21 LAB — HEMOGLOBIN AND HEMATOCRIT, BLOOD
HCT: 33.7 — ABNORMAL LOW
Hemoglobin: 10.2 — ABNORMAL LOW

## 2011-03-21 LAB — PROTIME-INR: Prothrombin Time: 14.7

## 2011-04-01 ENCOUNTER — Other Ambulatory Visit: Payer: Self-pay | Admitting: Family Medicine

## 2011-04-03 ENCOUNTER — Encounter (INDEPENDENT_AMBULATORY_CARE_PROVIDER_SITE_OTHER): Payer: Medicare Other | Admitting: *Deleted

## 2011-04-03 DIAGNOSIS — I739 Peripheral vascular disease, unspecified: Secondary | ICD-10-CM

## 2011-04-03 DIAGNOSIS — Z48812 Encounter for surgical aftercare following surgery on the circulatory system: Secondary | ICD-10-CM

## 2011-04-03 NOTE — Procedures (Unsigned)
LOWER EXTREMITY ARTERIAL DUPLEX  INDICATION:  Follow up left SFA and popliteal stents.  HISTORY: Diabetes:  No. Cardiac:  Yes. Hypertension:  Yes. Smoking:  Previous. Previous Surgery:  SINGLE LEVEL ARTERIAL EXAM                         RIGHT                LEFT Brachial: Anterior tibial: Posterior tibial: Peroneal: Ankle/Brachial Index:  LOWER EXTREMITY ARTERIAL DUPLEX EXAM  DUPLEX: 1. Widely patent left lower extremity stents with triphasic waveforms     throughout. 2. See diagram for details.  IMPRESSION:  Widely patent left superficial femoral artery and popliteal stents without evidence of hyperplasia or restenosis.  ___________________________________________ V. Charlena Cross, MD  LT/MEDQ  D:  04/03/2011  T:  04/03/2011  Job:  [id number]

## 2011-04-08 LAB — BASIC METABOLIC PANEL
BUN: 10
BUN: 13
BUN: 8
CO2: 27
CO2: 34 — ABNORMAL HIGH
Calcium: 8.4
Calcium: 8.7
Calcium: 9
Chloride: 103
Chloride: 107
Chloride: 108
Creatinine, Ser: 0.99
Creatinine, Ser: 1.04
Creatinine, Ser: 1.06
GFR calc Af Amer: >60
GFR calc Af Amer: >60
GFR calc Af Amer: >60
GFR calc Af Amer: >60
GFR calc Af Amer: >60
GFR calc non Af Amer: >60
GFR calc non Af Amer: >60
GFR calc non Af Amer: >60
Glucose, Bld: 106 — ABNORMAL HIGH
Glucose, Bld: 113 — ABNORMAL HIGH
Glucose, Bld: 130 — ABNORMAL HIGH
Potassium: 3.7
Potassium: 3.9
Potassium: 4.2
Sodium: 137
Sodium: 138
Sodium: 140
Sodium: 141

## 2011-04-08 LAB — URINALYSIS, ROUTINE W REFLEX MICROSCOPIC
Glucose, UA: 100 — AB
Ketones, ur: NEGATIVE
Leukocytes, UA: NEGATIVE
Nitrite: POSITIVE — AB
Protein, ur: >300 — AB

## 2011-04-08 LAB — DIFFERENTIAL
Basophils Absolute: 0.1
Basophils Relative: 1
Basophils Relative: 1
Eosinophils Absolute: 0.5
Eosinophils Absolute: 0.5
Eosinophils Absolute: 0.6
Eosinophils Relative: 6 — ABNORMAL HIGH
Eosinophils Relative: 7 — ABNORMAL HIGH
Lymphocytes Relative: 8 — ABNORMAL LOW
Lymphocytes Relative: 9 — ABNORMAL LOW
Lymphs Abs: 0.8
Lymphs Abs: 0.9
Lymphs Abs: 1.1
Monocytes Absolute: 0.9 — ABNORMAL HIGH
Monocytes Relative: 10
Monocytes Relative: 13 — ABNORMAL HIGH
Neutro Abs: 6.7
Neutro Abs: 8.3 — ABNORMAL HIGH
Neutrophils Relative %: 75
Neutrophils Relative %: 76

## 2011-04-08 LAB — HEPATIC FUNCTION PANEL
Alkaline Phosphatase: 73
Indirect Bilirubin: 0.5
Total Bilirubin: 0.6
Total Protein: 6.5

## 2011-04-08 LAB — CK TOTAL AND CKMB (NOT AT ARMC)
CK, MB: 3
CK, MB: 3
CK, MB: 3
Relative Index: INVALID
Relative Index: INVALID
Total CK: 61
Total CK: 63
Total CK: 72

## 2011-04-08 LAB — CBC
HCT: 29.3 — ABNORMAL LOW
HCT: 30.7 — ABNORMAL LOW
Hemoglobin: 11.3 — ABNORMAL LOW
Hemoglobin: 9.4 — ABNORMAL LOW
MCHC: 32.2
MCV: 79.7
MCV: 80.2
MCV: 81.1
Platelets: 216
Platelets: 220
RBC: 3.67 — ABNORMAL LOW
RBC: 4.23
RBC: 4.32
RDW: 17 — ABNORMAL HIGH
WBC: 11 — ABNORMAL HIGH
WBC: 7.9
WBC: 8.5

## 2011-04-08 LAB — TROPONIN I
Troponin I: 0.04
Troponin I: 0.06

## 2011-04-08 LAB — ABO/RH: ABO/RH(D): O POS

## 2011-04-08 LAB — CROSSMATCH: ABO/RH(D): O POS

## 2011-04-08 LAB — URINE CULTURE

## 2011-04-08 LAB — PROTIME-INR
INR: 1.1
Prothrombin Time: 14.5

## 2011-04-08 LAB — LIPID PANEL
Triglycerides: 92
VLDL: 18

## 2011-04-09 ENCOUNTER — Encounter: Payer: Self-pay | Admitting: Surgery

## 2011-04-18 ENCOUNTER — Telehealth: Payer: Self-pay | Admitting: Family Medicine

## 2011-04-18 NOTE — Telephone Encounter (Signed)
pls verify what is needed and document, I will most likely OK this , but send me the details pls

## 2011-04-18 NOTE — Telephone Encounter (Signed)
Just wants you to send the order to [facility] [employer] gave her fit please call [name]

## 2011-04-18 NOTE — Telephone Encounter (Signed)
York Spaniel he needs to be tested in 30 days

## 2011-04-23 ENCOUNTER — Other Ambulatory Visit: Payer: Self-pay | Admitting: Family Medicine

## 2011-04-23 NOTE — Telephone Encounter (Signed)
Pt coming in to get pulse ox checked in office without o2

## 2011-04-25 ENCOUNTER — Ambulatory Visit (INDEPENDENT_AMBULATORY_CARE_PROVIDER_SITE_OTHER): Payer: Medicare Other

## 2011-04-25 ENCOUNTER — Telehealth: Payer: Self-pay | Admitting: Family Medicine

## 2011-04-25 DIAGNOSIS — J449 Chronic obstructive pulmonary disease, unspecified: Secondary | ICD-10-CM

## 2011-04-25 NOTE — Progress Notes (Signed)
Patient came in to walk without O2 and when O2 was checked before walking it was 79/80.

## 2011-04-25 NOTE — Telephone Encounter (Signed)
States she needs someone to come out and do some therapy. He sits in a chair all day unless he has to go somewhere like the doctor. Won't get up and walk at all. Said when he was in facility, they made him walk around daily and he got much stronger. [facility] said to get an order faxed and they would see what they could do

## 2011-04-25 NOTE — Progress Notes (Signed)
WIll send for oxygen through [facility]

## 2011-04-26 ENCOUNTER — Other Ambulatory Visit: Payer: Self-pay | Admitting: Family Medicine

## 2011-04-26 DIAGNOSIS — M1990 Unspecified osteoarthritis, unspecified site: Secondary | ICD-10-CM

## 2011-04-26 NOTE — Telephone Encounter (Signed)
Referred to [facility]. They will call pt with appt and time. Mrya pt daughter is aware of this and knows that [facility] will be calling them

## 2011-04-26 NOTE — Telephone Encounter (Signed)
pls refer to out pt physical therapy, let them know

## 2011-04-29 ENCOUNTER — Telehealth: Payer: Self-pay | Admitting: Family Medicine

## 2011-04-29 ENCOUNTER — Other Ambulatory Visit: Payer: Self-pay | Admitting: Family Medicine

## 2011-04-29 NOTE — Telephone Encounter (Signed)
pls refer to dr ziggler/jenkins eval lipoma, referral entered

## 2011-04-30 NOTE — Telephone Encounter (Signed)
luann made this pt a appt with dr. Girtha Rm on 04/30/2011. Pt aware

## 2011-05-01 ENCOUNTER — Telehealth: Payer: Self-pay | Admitting: Family Medicine

## 2011-05-01 MED ORDER — HYDROCODONE-ACETAMINOPHEN 5-500 MG PO TABS: ORAL_TABLET | ORAL | Status: DC

## 2011-05-01 NOTE — Telephone Encounter (Signed)
Waiting for doc to sign to be faxed

## 2011-05-02 ENCOUNTER — Ambulatory Visit: Payer: Medicare Other | Admitting: Family Medicine

## 2011-05-08 ENCOUNTER — Telehealth: Payer: Self-pay | Admitting: Family Medicine

## 2011-05-08 ENCOUNTER — Ambulatory Visit (HOSPITAL_COMMUNITY)
Admission: RE | Admit: 2011-05-08 | Discharge: 2011-05-08 | Disposition: A | Discharge status: Home / Self Care | Payer: Medicare Other | Source: Ambulatory Visit | Attending: Family Medicine | Admitting: Family Medicine

## 2011-05-08 ENCOUNTER — Other Ambulatory Visit: Payer: Self-pay | Admitting: Family Medicine

## 2011-05-08 DIAGNOSIS — M6281 Muscle weakness (generalized): Secondary | ICD-10-CM | POA: Insufficient documentation

## 2011-05-08 DIAGNOSIS — J4489 Other specified chronic obstructive pulmonary disease: Secondary | ICD-10-CM | POA: Insufficient documentation

## 2011-05-08 DIAGNOSIS — M25676 Stiffness of unspecified foot, not elsewhere classified: Secondary | ICD-10-CM | POA: Insufficient documentation

## 2011-05-08 DIAGNOSIS — M25579 Pain in unspecified ankle and joints of unspecified foot: Secondary | ICD-10-CM | POA: Insufficient documentation

## 2011-05-08 DIAGNOSIS — IMO0001 Reserved for inherently not codable concepts without codable children: Secondary | ICD-10-CM | POA: Insufficient documentation

## 2011-05-08 DIAGNOSIS — J449 Chronic obstructive pulmonary disease, unspecified: Secondary | ICD-10-CM | POA: Insufficient documentation

## 2011-05-08 DIAGNOSIS — R269 Unspecified abnormalities of gait and mobility: Secondary | ICD-10-CM | POA: Insufficient documentation

## 2011-05-08 DIAGNOSIS — I10 Essential (primary) hypertension: Secondary | ICD-10-CM | POA: Insufficient documentation

## 2011-05-08 DIAGNOSIS — M25673 Stiffness of unspecified ankle, not elsewhere classified: Secondary | ICD-10-CM | POA: Insufficient documentation

## 2011-05-08 MED ORDER — ALBUTEROL SULFATE 0.63 MG/3ML IN NEBU: 1.0000 | INHALATION_SOLUTION | Freq: Four times a day (QID) | RESPIRATORY_TRACT | Status: DC | PRN

## 2011-05-08 NOTE — Telephone Encounter (Signed)
Let her know new med is sent in please, and fax the note at your station to [facility] please

## 2011-05-08 NOTE — Progress Notes (Signed)
Physical Therapy Evaluation  Patient Details  Name: [name] MRN: [id number] Date of Birth: May 20, 1922  Today's Date: 05/08/2011 Time: 10:18-11:03 Charges: 1 eval Time Calculation (min): 45 min Visit#: 1 of 8 Re-eval:   Past Medical History:  Past Medical History  Diagnosis Date  . Valvular heart disease   . Obesity   . Osteoarthritis   . Hypertension   . Hyperlipidemia   . COPD (chronic obstructive pulmonary disease)   . CHF (congestive heart failure) diastolic 10/2010  . Pneumonia   . Impaired glucose tolerance   . Atrial fibrillation     Chronic  . Elevated prostate specific antigen (PSA)   . Cancer     Prostate  . Reflux    Past Surgical History:  Past Surgical History  Procedure Date  . Left knee   . Ulcer surgery   . Bladder surgery   . Mitral valve replacement 2003  . Coronary artery bypass graft   . Rca with stent replacement      1994  . Right eye surgery      2007  . Left sfa stent     05/08/11 1000  Assessment  Diagnosis Severe arthrisitis   Next MD Visit 06/06/11  Prior Therapy June at [facility].  Precautions  Precautions Fall  Restrictions  Weight Bearing Restrictions No  Home Living  Lives With Family  Receives Help From Family  Type of Home House  Home Layout One level  Home Access Stairs to enter  Entrance Stairs-Number of Steps 3  Additional Comments Pt lives with his daughter who cooks and cleans for him.  He is able to take care of himself with pesonal hygenie and dressing  Prior Function  Level of Independence Independent with basic ADLs;Needs assistance with homemaking;Needs assistance with gait;Needs assistance with tranfers  Able to Take Stairs? No  Driving Yes  Cognition  Arousal/Alertness Awake/alert  Orientation Level Oriented X4  Functional Tests  Functional Tests 5 STS w/UE support 26.6 sec  RLE Strength  RLE Overall Strength Comments WNL throughout  LLE Strength  LLE Overall Strength Comments WNL throughout    Ambulation/Gait  Ambulation/Gait Yes  Ambulation/Gait Assistance 5: Supervision  Ambulation/Gait Assistance Details (indicate cue type and reason) Cueing for proper hand placment with use of RW  Ambulation Distance (Feet) 150 Feet  Assistive device Rolling walker  Gait Pattern Decreased stride length  Posture/Postural Control  Posture/Postural Control Postural limitations  Postural Limitations Slouched posture  Standardized Balance Assessment  Standardized Balance Assessment TUG  Timed Up and Go Test  TUG Normal TUG  Normal TUG (seconds) 43.4    Balance: Unable to perform independent SLS and tandem stance   Subjective Symptoms/Limitations Symptoms: Pt is an 77 year old male referred to PT secondary to severe arthritis in his LE.  His family is present to give his history. He is currently using a RW for short distances and a power chair to move around.  He had 4 stents placed in his legs in the summer which helped to improve his circulation in his L leg (initally they were going to remove the leg secondary to increased gangrene).  The stents have been able to relieve the pain and his leg wounds have healed.  Currently he is getting up only minimally.  has a 3 cm abdominal aorta anyuersum.  His MD wants him to start moving more in order to prevent futher medical complications.  independent with dressing, bathing, pericare, eating.  Needs assitance with walking, fixing  meals and lives with his daughter.  has 2 other children locally. Pain on the bottom of his foot descirbes as a burning sensation. How long can you stand comfortably?: as long as he is touching a counter can stand 20 minutes.  How long can you walk comfortably?: 30 feet with RW Pain Assessment Currently in Pain?: Yes Pain Score:   7 Pain Location: Foot Pain Orientation: Left Pain Type: Chronic pain Pain Frequency: Constant   Exercise/Treatments  05/08/11 1059  Knee Exercises: Standing  Functional Squat 10 reps  Knee  Exercises: Seated  Other Seated Knee Exercises Heel and Toe Raises 10x5 sec hold   STS w/o UE suppor w/mod A x3    Physical Therapy Assessment and Plan PT Assessment and Plan Clinical Impression Statement: Pt is a 77 year old male referred for difficulty with walking secondary to severe arthritis.  After examination it was found that the he has current body structure impairments including: increased pain, decreased LE power, impaired balance, and difficulty walking which are limiting his ability to participate in household and community activities. Pt will benefit from skilled PT service to address the above body structure impairments in order to maximize function in order to improve quality of life. Rehab Potential: Good PT Frequency: Min 3X/week PT Duration: 4 weeks PT Treatment/Interventions: DME instruction;Gait training;Stair training;Functional mobility training;Therapeutic exercise;Balance training;Neuromuscular re-education;Patient/family education PT Plan: Add: Complete Hospital doctor.  Gt training activities, tandem stance     Goals Home Exercise Program Pt will Perform Home Exercise Program: Independently PT Short Term Goals Time to Complete Short Term Goals: 2 weeks PT Short Term Goal 1: Pt will ambulate with mod I x 5 minutes for improved activity tolerance.  PT Short Term Goal 2: Pt will demonstrate sit to stand without UE support. PT Short Term Goal 3: Pt will complete the Berg Balance test.  PT Long Term Goals Time to Complete Long Term Goals: 4 weeks PT Long Term Goal 1: Pt will demonstrate 5 STS in 20 secs w/UE support for improved LE power.  PT Long Term Goal 2: Pt will complete the TUG with LRAD in 35 sec for improved safety with household ambulation.   Long Term Goal 3: Pt will score at least a 40/56 on the Berg Balance Test for improved safety in the home.  Long Term Goal 4: Pt will improve his activity tolerance and tolerate ambulating x10 minutes w/LRAD in a  household environment in order to decrease risk of secondary impairments caused by multiple medical conditions.   Problem List Patient Active Problem List  Diagnoses  . PROSTATE CANCER  . IMPAIRED GLUCOSE TOLERANCE  . HYPERLIPIDEMIA  . OBESITY  . IRON DEFIC ANEMIA SEC DIET IRON INTAKE  . HYPERTENSION  . VALVULAR HEART DISEASE  . ATRIAL FIBRILLATION, CHRONIC  . OSTEOARTHRITIS  . KNEE, ARTHRITIS, DEGEN./OSTEO  . IMPAIRED FASTING GLUCOSE  . COPD  . Diastolic heart failure  . Decubitus ulcers  . Respiratory failure, acute and chronic  . Peripheral vascular disease  . NSTEMI (non-ST elevated myocardial infarction)  . Foot pain, left  . COPD (chronic obstructive pulmonary disease)  . Difficulty in walking  . Balance problem    PT - End of Session Activity Tolerance: Patient tolerated treatment well   Jayliani Wanner 05/08/2011, 6:11 PM  Physician Documentation Your signature is required to indicate approval of the treatment plan as stated above.  Please sign and either send electronically or make a copy of this report for your files  and return this physician signed original.   Please mark one 1.__approve of plan  2. ___approve of plan with the following conditions.   ______________________________                                                          _____________________ Physician Signature                                                                                                             Date

## 2011-05-09 NOTE — Telephone Encounter (Signed)
Pts daughter is aware and faxed paper over

## 2011-05-10 ENCOUNTER — Other Ambulatory Visit: Payer: Self-pay | Admitting: Family Medicine

## 2011-05-10 ENCOUNTER — Ambulatory Visit (HOSPITAL_COMMUNITY)
Admission: RE | Admit: 2011-05-10 | Discharge: 2011-05-10 | Disposition: A | Discharge status: Home / Self Care | Payer: Medicare Other | Source: Ambulatory Visit | Attending: Family Medicine | Admitting: Family Medicine

## 2011-05-10 NOTE — Progress Notes (Signed)
Physical Therapy Treatment Patient Details  Name: [name] MRN: [id number] Date of Birth: February 24, 1922  Today's Date: 05/10/2011 Time: 4098-1191 Time Calculation (min): 43 min Visit#: 2  of 12   Re-eval: 04/11/11  Charge: Physical performance testing: 35 min Gait 8 min  Subjective: Symptoms/Limitations Symptoms: Muscle soreness following Wednesday, pt entered session amb with RW and oxygen. Pain Assessment Currently in Pain?: Yes  Precautions/Restrictions     Mobility (including Balance) Ambulation/Gait Ambulation/Gait: Yes Ambulation/Gait Assistance: 5: Supervision Ambulation/Gait Assistance Details (indicate cue type and reason): Cueing for proper gait with RW and posture Ambulation Distance (Feet): 175 Feet Assistive device: Rolling walker Gait Pattern: Decreased stride length  Posture/Postural Control Posture/Postural Control: Postural limitations Postural Limitations: Slouched posture Berg Balance Test Sit to Stand: Able to stand using hands after several tries Standing Unsupported: Able to stand 2 minutes with supervision Sitting with Back Unsupported but Feet Supported on Floor or Stool: Able to sit 2 minutes under supervision Stand to Sit: Uses backs of legs against chair to control descent Transfers: Able to transfer safely, definite need of hands Standing Unsupported with Eyes Closed: Able to stand 10 seconds with supervision Standing Ubsupported with Feet Together: Able to place feet together independently and stand for 1 minute with supervision From Standing, Reach Forward with Outstretched Arm: Can reach forward >12 cm safely (5") From Standing Position, Pick up Object from Floor: Unable to pick up shoe, but reaches 2-5 cm (1-2") from shoe and balances independently From Standing Position, Turn to Look Behind Over each Shoulder: Turn sideways only but maintains balance Turn 360 Degrees: Able to turn 360 degrees safely but slowly Standing Unsupported,  Alternately Place Feet on Step/Stool: Able to complete >2 steps/needs minimal assist Standing Unsupported, One Foot in Front: Loses balance while stepping or standing Standing on One Leg: Unable to try or needs assist to prevent fall Total Score: 29   Exercise/Treatments BERG and gait  Physical Therapy Assessment and Plan PT Assessment and Plan Clinical Impression Statement: BERG Balance test complete with score 29/56, high risk of falling requires RW to prevent falls.  Pt easily fatigued this session, required frequent rest breaks.  Cueing required during gait for posture and to increase stride length. PT Plan: Continue with current POC, focus on balance and gait training.    Goals    Problem List Patient Active Problem List  Diagnoses  . PROSTATE CANCER  . IMPAIRED GLUCOSE TOLERANCE  . HYPERLIPIDEMIA  . OBESITY  . IRON DEFIC ANEMIA SEC DIET IRON INTAKE  . HYPERTENSION  . VALVULAR HEART DISEASE  . ATRIAL FIBRILLATION, CHRONIC  . OSTEOARTHRITIS  . KNEE, ARTHRITIS, DEGEN./OSTEO  . IMPAIRED FASTING GLUCOSE  . COPD  . Diastolic heart failure  . Decubitus ulcers  . Respiratory failure, acute and chronic  . Peripheral vascular disease  . NSTEMI (non-ST elevated myocardial infarction)  . Foot pain, left  . COPD (chronic obstructive pulmonary disease)  . Difficulty in walking  . Balance problem    PT - End of Session Activity Tolerance: Patient tolerated treatment well;Patient limited by fatigue General Behavior During Session: [facility] for tasks performed Cognition: [facility] for tasks performed  [name] 05/10/2011, 9:35 AM

## 2011-05-14 ENCOUNTER — Ambulatory Visit (HOSPITAL_COMMUNITY)
Admission: RE | Admit: 2011-05-14 | Discharge: 2011-05-14 | Disposition: A | Discharge status: Home / Self Care | Payer: Medicare Other | Source: Ambulatory Visit | Attending: Family Medicine | Admitting: Family Medicine

## 2011-05-14 NOTE — Progress Notes (Signed)
Physical Therapy Treatment Patient Details  Name: [name] MRN: [id number] Date of Birth: 16-Jun-1922  Today's Date: 05/14/2011 Time: 4098-1191 Time Calculation (min): 42 min Charges: 35' NMR, 8' Gt Visit#: 3  of 12   Re-eval: 05/10/11    Subjective: Symptoms/Limitations Symptoms: Pt daughter reports that he is doing well at home and is walking better.  Continues to have pain in the bottom of his L foot.     Exercise/Treatments Standing Gait Training: w/grocery cart to simulate shopping x8 minutes Other Standing Knee Exercises: Sit to stand w/max A first 25% x5 w/o UE support Seated Other Seated Knee Exercises: Heel and Toe Raises 20x5 sec  Other Seated Knee Exercises: Roll in and outs x10 each; Hip IR and ER x10 each leg individully  Prone  Hamstring Curl: 5 reps   Balance Exercises Rotation with Cones: standing on static surface with LUE reach to PT L hand in the air, same with R x10 - to improve pelvic rotation Single Limb Stance: Right;Left;Limitations Single Limb Stance Limitations: Modified 6x15 sec; 3x with head and pelvis turns Balance Poses      Physical Therapy Assessment and Plan PT Assessment and Plan Clinical Impression Statement: has improved activity tolerance, however continues to be limited by cramping to his L abdominal and low back region.  notable decrease in L hip IR which may be the cause to his increased cramping and decreased pelvic rotation with gait.  PT Plan: Cont to improve balance an gait.     Goals    Problem List Patient Active Problem List  Diagnoses  . PROSTATE CANCER  . IMPAIRED GLUCOSE TOLERANCE  . HYPERLIPIDEMIA  . OBESITY  . IRON DEFIC ANEMIA SEC DIET IRON INTAKE  . HYPERTENSION  . VALVULAR HEART DISEASE  . ATRIAL FIBRILLATION, CHRONIC  . OSTEOARTHRITIS  . KNEE, ARTHRITIS, DEGEN./OSTEO  . IMPAIRED FASTING GLUCOSE  . COPD  . Diastolic heart failure  . Decubitus ulcers  . Respiratory failure, acute and chronic  .  Peripheral vascular disease  . NSTEMI (non-ST elevated myocardial infarction)  . Foot pain, left  . COPD (chronic obstructive pulmonary disease)  . Difficulty in walking  . Balance problem       [name] 05/14/2011, 12:05 PM

## 2011-05-15 ENCOUNTER — Ambulatory Visit (HOSPITAL_COMMUNITY): Payer: Medicare Other | Admitting: Physical Therapy

## 2011-05-15 ENCOUNTER — Telehealth (HOSPITAL_COMMUNITY): Payer: Self-pay

## 2011-05-21 ENCOUNTER — Ambulatory Visit (HOSPITAL_COMMUNITY)
Admission: RE | Admit: 2011-05-21 | Discharge: 2011-05-21 | Disposition: A | Discharge status: Home / Self Care | Payer: Medicare Other | Source: Ambulatory Visit | Attending: Family Medicine | Admitting: Family Medicine

## 2011-05-21 NOTE — Progress Notes (Signed)
Physical Therapy Treatment Patient Details  Name: [name] MRN: [id number] Date of Birth: 1922-05-18  Today's Date: 05/21/2011 Time: 1100-1150 Time Calculation (min): 50 min Visit#: 4  of 12   Re-eval: 06/05/11  Charge: gait 20 min NMR 30 min  Subjective: Symptoms/Limitations Symptoms: Pt stated he has been doing exercises at home, able to stand up off of bed with no hands. Pain Assessment Currently in Pain?: No/denies  Objective:  Exercise/Treatments Standing Heel Raises: 4 sets;10 reps;Limitations Heel Raises Limitations: during gait with shopping cart for increased endurance Functional Squat: 4 sets;10 reps;Limitations Functional Squat Limitations: during gait with shopping cart for endurance Gait Training: w/grocery cart to simulate shopping x20 minutes Seated Other Seated Knee Exercises: Heel and Toe Raises 20x5 sec  Other Seated Knee Exercises: Roll in and outs x10 each; Hip IR and ER x10 each leg individully  Balance Exercises Tandem Stance: Limitations Tandem Stance Limitations: Tandem stance with UE PNF D1/D2 patterns 10 reps for posture and pelvic mobility  Physical Therapy Assessment and Plan PT Assessment and Plan Clinical Impression Statement: Pt with increased activity tolerance/endurance but requires constant cueing for posture during gait.   PT Plan: Continue with current POC, improve balance and gait, begin gait with quad/SPC trial for posture/endurance when ready.    Goals    Problem List Patient Active Problem List  Diagnoses  . PROSTATE CANCER  . IMPAIRED GLUCOSE TOLERANCE  . HYPERLIPIDEMIA  . OBESITY  . IRON DEFIC ANEMIA SEC DIET IRON INTAKE  . HYPERTENSION  . VALVULAR HEART DISEASE  . ATRIAL FIBRILLATION, CHRONIC  . OSTEOARTHRITIS  . KNEE, ARTHRITIS, DEGEN./OSTEO  . IMPAIRED FASTING GLUCOSE  . COPD  . Diastolic heart failure  . Decubitus ulcers  . Respiratory failure, acute and chronic  . Peripheral vascular disease  . NSTEMI  (non-ST elevated myocardial infarction)  . Foot pain, left  . COPD (chronic obstructive pulmonary disease)  . Difficulty in walking  . Balance problem    PT - End of Session Activity Tolerance: Patient tolerated treatment well;Patient limited by fatigue General Behavior During Session: [facility] for tasks performed Cognition: [facility] for tasks performed  [name] 05/21/2011, 12:03 PM

## 2011-05-22 ENCOUNTER — Ambulatory Visit (HOSPITAL_COMMUNITY)
Admission: RE | Admit: 2011-05-22 | Discharge: 2011-05-22 | Disposition: A | Discharge status: Home / Self Care | Payer: Medicare Other | Source: Ambulatory Visit | Attending: Family Medicine | Admitting: Family Medicine

## 2011-05-22 NOTE — Progress Notes (Signed)
Physical Therapy Treatment Patient Details  Name: [name] MRN: [id number] Date of Birth: May 17, 1922  Today's Date: 05/22/2011 Time: 4696-2952 Time Calculation (min): 48 min Visit#: 5  of 12   Re-eval: 06/05/11  Charge: Gait 24 min NMR 20 min  Subjective: Symptoms/Limitations Symptoms: Pt stated soreness following last sessoin, foot bothering him today.  Heperformed some of the same activities at home last night.  Pt reported he is ambulating with SPC around home, brought in his cane to walk with here today. Pain Assessment Currently in Pain?: Yes Pain Score:   7 Pain Location: Foot Pain Orientation: Left  Objective:   Exercise/Treatments Standing Heel Raises: 20 reps;Limitations Heel Raises Limitations: 20 standing, 20 sittinged/ 20 toe raises Functional Squat: 1 set;20 reps Gait Training: w/ grocery cart in hallway for endurance, rest at 2\' 23" , 4\' 20"  and 6\' 41" ;  Gait 2 sets 85 feet with SPC with improved posture/cadence shown. Other Standing Knee Exercises: sit to stand 5 x Seated Other Seated Knee Exercises: Heel and Toe Raises 20x5 sec  Balance Exercises Tandem Stance: Limitations Tandem Stance Limitations: Tandem stance with UE PNF D1/D2 patterns 10 reps for posture and pelvic mobility  Physical Therapy Assessment and Plan PT Assessment and Plan Clinical Impression Statement: Pt with increased activity tolerance with cueing for posture and equal stride length.  Better cadence and posture shown with SPC gait.   PT Plan: Continue with current POC.    Goals PT Short Term Goals PT Short Term Goal 3 - Progress: Met  Problem List Patient Active Problem List  Diagnoses  . PROSTATE CANCER  . IMPAIRED GLUCOSE TOLERANCE  . HYPERLIPIDEMIA  . OBESITY  . IRON DEFIC ANEMIA SEC DIET IRON INTAKE  . HYPERTENSION  . VALVULAR HEART DISEASE  . ATRIAL FIBRILLATION, CHRONIC  . OSTEOARTHRITIS  . KNEE, ARTHRITIS, DEGEN./OSTEO  . IMPAIRED FASTING GLUCOSE  . COPD  .  Diastolic heart failure  . Decubitus ulcers  . Respiratory failure, acute and chronic  . Peripheral vascular disease  . NSTEMI (non-ST elevated myocardial infarction)  . Foot pain, left  . COPD (chronic obstructive pulmonary disease)  . Difficulty in walking  . Balance problem    PT - End of Session Activity Tolerance: Patient tolerated treatment well General Behavior During Session: [facility] for tasks performed Cognition: [facility] for tasks performed  [name] 05/22/2011, 1:10 PM

## 2011-05-23 ENCOUNTER — Other Ambulatory Visit: Payer: Self-pay | Admitting: Family Medicine

## 2011-05-24 ENCOUNTER — Ambulatory Visit (HOSPITAL_COMMUNITY)
Admission: RE | Admit: 2011-05-24 | Discharge: 2011-05-24 | Disposition: A | Discharge status: Home / Self Care | Payer: Medicare Other | Source: Ambulatory Visit | Attending: Family Medicine | Admitting: Family Medicine

## 2011-05-24 NOTE — Progress Notes (Signed)
Physical Therapy Treatment Patient Details  Name: [name] MRN: [id number] Date of Birth: 01-Nov-1921  Today's Date: 05/24/2011 Time: 1191-4782 Time Calculation (min): 38 min Visit#: 6  of 12   Re-eval: 06/05/11  Charge: gait 18 min therex 20 min  Subjective: Symptoms/Limitations Symptoms: Pt stated soreness still there but not as bad as it has been.  Pt continues to c/o pain L foot sore, rated pain 10/10 folllowing gait, 0/10 when none WB; when asked if need to go to ER, daughter stated she would call MD office and ask.   Pain Assessment Currently in Pain?: Yes Pain Score: 10-Worst pain ever Pain Location: Foot Pain Orientation: Left   Exercise/Treatments Standing Gait Training: Gait with SPC x 18 min with rest breaks every 1\' 30"  Other Standing Knee Exercises: sit to stand no UE A 3x Seated Long Arc Quad: Both;20 reps;Weights Long Arc Quad Weight: 3 lbs. Other Seated Knee Exercises: Heel and Toe Raises 20x5 sec  Other Seated Knee Exercises: Roll in and outs 10x 5" each Supine Bridges: 15 reps Straight Leg Raises: 15 reps;Both  Physical Therapy Assessment and Plan PT Assessment and Plan Clinical Impression Statement: Pt with improving endurance, cadence, and good sequencing with SPC gait.  Held standing therex secondary to high foot pain levels today.  Daughter plans to call MD office today to address the high foot pain. PT Plan: Continue with current POC.    Goals    Problem List Patient Active Problem List  Diagnoses  . PROSTATE CANCER  . IMPAIRED GLUCOSE TOLERANCE  . HYPERLIPIDEMIA  . OBESITY  . IRON DEFIC ANEMIA SEC DIET IRON INTAKE  . HYPERTENSION  . VALVULAR HEART DISEASE  . ATRIAL FIBRILLATION, CHRONIC  . OSTEOARTHRITIS  . KNEE, ARTHRITIS, DEGEN./OSTEO  . IMPAIRED FASTING GLUCOSE  . COPD  . Diastolic heart failure  . Decubitus ulcers  . Respiratory failure, acute and chronic  . Peripheral vascular disease  . NSTEMI (non-ST elevated  myocardial infarction)  . Foot pain, left  . COPD (chronic obstructive pulmonary disease)  . Difficulty in walking  . Balance problem    PT - End of Session Activity Tolerance: Patient tolerated treatment well General Behavior During Session: [facility] for tasks performed Cognition: [facility] for tasks performed  [name] 05/24/2011, 12:14 PM

## 2011-05-27 ENCOUNTER — Inpatient Hospital Stay (HOSPITAL_COMMUNITY): Admission: RE | Admit: 2011-05-27 | Payer: Medicare Other | Source: Ambulatory Visit

## 2011-05-27 ENCOUNTER — Telehealth (HOSPITAL_COMMUNITY): Payer: Self-pay

## 2011-05-28 ENCOUNTER — Ambulatory Visit (HOSPITAL_COMMUNITY): Payer: Medicare Other

## 2011-05-29 ENCOUNTER — Ambulatory Visit (HOSPITAL_COMMUNITY): Payer: Medicare Other | Admitting: Physical Therapy

## 2011-05-31 ENCOUNTER — Encounter: Payer: Self-pay | Admitting: Family Medicine

## 2011-06-03 ENCOUNTER — Ambulatory Visit (HOSPITAL_COMMUNITY): Payer: Medicare Other | Admitting: Physical Therapy

## 2011-06-03 ENCOUNTER — Encounter: Payer: Self-pay | Admitting: Surgery

## 2011-06-04 ENCOUNTER — Encounter (HOSPITAL_COMMUNITY): Payer: Self-pay | Admitting: Pharmacy Technician

## 2011-06-04 ENCOUNTER — Encounter (HOSPITAL_COMMUNITY)
Admission: RE | Admit: 2011-06-04 | Discharge: 2011-06-04 | Disposition: A | Discharge status: Home / Self Care | Payer: Medicare Other | Source: Ambulatory Visit | Attending: General Surgery | Admitting: General Surgery

## 2011-06-04 ENCOUNTER — Encounter (HOSPITAL_COMMUNITY): Payer: Self-pay

## 2011-06-04 HISTORY — DX: Unspecified hearing loss, unspecified ear: H91.90

## 2011-06-04 LAB — DIFFERENTIAL
Eosinophils Absolute: 0.3 10*3/uL (ref 0.0–0.7)
Eosinophils Relative: 4 % (ref 0–5)
Lymphs Abs: 0.9 10*3/uL (ref 0.7–4.0)
Monocytes Relative: 14 % — ABNORMAL HIGH (ref 3–12)
Neutrophils Relative %: 69 % (ref 43–77)

## 2011-06-04 LAB — BASIC METABOLIC PANEL
CO2: 35 mEq/L — ABNORMAL HIGH (ref 19–32)
Chloride: 102 mEq/L (ref 96–112)
Glucose, Bld: 120 mg/dL — ABNORMAL HIGH (ref 70–99)
Potassium: 5.1 mEq/L (ref 3.5–5.1)
Sodium: 143 mEq/L (ref 135–145)

## 2011-06-04 LAB — CBC
Hemoglobin: 10.2 g/dL — ABNORMAL LOW (ref 13.0–17.0)
MCH: 25.1 pg — ABNORMAL LOW (ref 26.0–34.0)
MCV: 86 fL (ref 78.0–100.0)
RBC: 4.07 MIL/uL — ABNORMAL LOW (ref 4.22–5.81)

## 2011-06-04 NOTE — Patient Instructions (Addendum)
[name]  06/04/2011   Your procedure is scheduled on:  06/07/2011  Report to [facility] at 08:40 AM.  Call this number if you have problems the morning of surgery: [phone]   Remember:   Do not eat food:After Midnight.  May have clear liquids:until Midnight .  Clear liquids include soda, tea, black coffee, apple or grape juice, broth.  Take these medicines the morning of surgery with A SIP OF WATER: Allopurinol, Gabapentin, Vicodin, Metoprolol and Prilosec. Also, take your inhalers, Advair, Albuterol and Atrovent.   Do not wear jewelry, make-up or nail polish.  Do not wear lotions, powders, or perfumes. You may wear deodorant.  Do not shave 48 hours prior to surgery.  Do not bring valuables to the hospital.  Contacts, dentures or bridgework may not be worn into surgery.  Leave suitcase in the car. After surgery it may be brought to your room.  For patients admitted to the hospital, checkout time is 11:00 AM the day of discharge.   Patients discharged the day of surgery will not be allowed to drive home.  Name and phone number of your driver:   Special Instructions: CHG Shower Use Special Wash: 1/2 bottle night before surgery and 1/2 bottle morning of surgery.   Please read over the following fact sheets that you were given: Pain Booklet, MRSA Information, Surgical Site Infection Prevention, Anesthesia Post-op Instructions and Care and Recovery After Surgery    Excision of Skin Lesions Excision of a skin lesion refers to the removal of a section of skin by making small cuts (incisions) in the skin. This is typically done to remove a cancerous growth (basal cell carcinoma, squamous cell carcinoma, or melanoma) or a noncancerous growth (cyst). It may be done to treat or prevent cancer or infection. It may also be done to improve cosmetic appearance (removal of mole, skin tag). LET YOUR CAREGIVER KNOW ABOUT:   Allergies to food or medicine.   Medicines taken, including  vitamins, herbs, eyedrops, over-the-counter medicines, and creams.   Use of steroids (by mouth or creams).   Previous problems with anesthetics or numbing medicines.   History of bleeding problems or blood clots.   History of any prostheses.   Previous surgery.   Other health problems, including diabetes and kidney problems.   Possibility of pregnancy, if this applies.  RISKS AND COMPLICATIONS  Many complications can be managed. With appropriate treatment and rehabilitation, the following complications are very uncommon:  Bleeding.   Infection.   Scarring.   Recurrence of cyst or cancer.   Changes in skin sensation or appearance (discoloration, swelling).   Reaction to anesthesia.   Allergic reaction to surgical materials or ointments.   Damage to nerves, blood vessels, muscles, or other structures.   Continued pain.  BEFORE THE PROCEDURE  It is important to follow your caregiver's instructions prior to your procedure to avoid complications. Steps before your procedure may include:  Physical exam, blood tests, other procedures, such as removing a small sample for examination under a microscope (biopsy).   Your caregiver may review the procedure, the anesthesia being used, and what to expect after the procedure with you.  You may be asked to:  Stop taking certain medicines, such as blood thinners (including aspirin, clopidogrel, ibuprofen), for several days prior to your procedure.   Take certain medicines.   Stop smoking.  It is a good idea to arrange for a ride home after surgery and to have someone to help you  with activities during recovery. PROCEDURE  There are several excision techniques. The type of excision or surgical technique used will depend on your condition, the location of the lesion, and your overall health. After the lesion is sterilized and a local anesthetic is applied, the following may be performed: Complete surgical excision The area to be  removed is marked with a pen. Using a small scalpel and scissors, the surgeon gently cuts around and under the lesion until it is completely removed. The lesion is placed in a special fluid and sent to the lab for examination. If necessary, bleeding will be controlled with a device that delivers heat. The edges of the wound are stitched together and a dressing is applied. This procedure may be performed to treat a cancerous growth or noncancerous cyst or lesion. Surgeons commonly perform an elliptical excision, to minimize scarring. Excision of a cyst The surgeon makes an incision on the cyst. The entire cyst is removed through the incision. The wound may be closed with a suture (stitch). Shave excision During shave excision, the surgeon uses a small blade or loop instrument to shave off the lesion. This may be done to remove a mole or skin tag. The wound is usually left to heal on its own without stitches. Punch excision During punch excision, the surgeon uses a small, round tool (like a cookie cutter) to cut a circle shape out of the skin. The outer edges of the skin are stitched together. This may be done to remove a mole or scar or to perform a biopsy of the lesion. Mohs micrographic surgery During Mohs micrographic surgery, layers of the lesion are removed with a scalpel or loop instrument and immediately examined under a microscope until all of the abnormal or cancerous tissue is removed. This procedure is minimally invasive and ensures the best cosmetic outcome, with removal of as little normal tissue as possible. Mohs is usually done to treat skin cancer, such as basal cell carcinoma or squamous cell carcinoma, particularly on the face and ears. Antibiotic ointment is applied to the surgical area after each of the procedures listed above, as necessary. AFTER THE PROCEDURE  How well you heal depends on many factors. Most patients heal quite well with proper techniques and self-care. Scarring will  lessen over time. HOME CARE INSTRUCTIONS   Take medicines for pain as directed.   Keep the incision area clean, dry, and protected for at least 48 hours. Change dressings as directed.   For bleeding, apply gentle but firm pressure to the wound using a folded towel for 20 minutes. Call your caregiver if bleeding does not stop.   Avoid high-impact exercise and activities until the stitches are removed or the area heals.   Follow your caregiver's instructions to minimize scarring. Avoid sun exposure until the area has healed. Scarring should lessen over time.   Follow up with your caregiver as directed. Removal of stitches within 4 to 14 days may be necessary.  Finding out the results of your test Not all test results are available during your visit. If your test results are not back during the visit, make an appointment with your caregiver to find out the results. Do not assume everything is normal if you have not heard from your caregiver or the medical facility. It is important for you to follow up on all of your test results. SEEK MEDICAL CARE IF:   You or your child has an oral temperature above 102 F (38.9 C).   You  develop signs of infection (chills, feeling unwell).   You notice bleeding, pain, discharge, redness, or swelling at the incision site.   You notice skin irregularities or changes in sensation.  MAKE SURE YOU:   Understand these instructions.   Will watch your condition.   Will get help right away if you are not doing well or get worse.  FOR MORE INFORMATION  American Academy [employer]: www.https://powers.com/ American Academy of Dermatology: InfoExam.si Document Released: 09/04/2009 Document Revised: 02/20/2011 Document Reviewed: 09/04/2009 [facility] Patient Information 2012 [place], [place].   PATIENT INSTRUCTIONS POST-ANESTHESIA  IMMEDIATELY FOLLOWING SURGERY:  Do not drive or operate machinery for the first twenty four hours after surgery.  Do not make any  important decisions for twenty four hours after surgery or while taking narcotic pain medications or sedatives.  If you develop intractable nausea and vomiting or a severe headache please notify your doctor immediately.  FOLLOW-UP:  Please make an appointment with your surgeon as instructed. You do not need to follow up with anesthesia unless specifically instructed to do so.  WOUND CARE INSTRUCTIONS (if applicable):  Keep a dry clean dressing on the anesthesia/puncture wound site if there is drainage.  Once the wound has quit draining you may leave it open to air.  Generally you should leave the bandage intact for twenty four hours unless there is drainage.  If the epidural site drains for more than 36-48 hours please call the anesthesia department.  QUESTIONS?:  Please feel free to call your physician or the hospital operator if you have any questions, and they will be happy to assist you.     [facility] Anesthesia Department [address] [place] [phone]

## 2011-06-05 ENCOUNTER — Ambulatory Visit (HOSPITAL_COMMUNITY): Payer: Medicare Other

## 2011-06-06 ENCOUNTER — Ambulatory Visit (INDEPENDENT_AMBULATORY_CARE_PROVIDER_SITE_OTHER): Payer: Medicare Other | Admitting: Family Medicine

## 2011-06-06 ENCOUNTER — Encounter: Payer: Self-pay | Admitting: Family Medicine

## 2011-06-06 VITALS — BP 118/70 | HR 90 | Resp 18 | Ht 71.0 in | Wt 246.0 lb

## 2011-06-06 DIAGNOSIS — R7301 Impaired fasting glucose: Secondary | ICD-10-CM

## 2011-06-06 DIAGNOSIS — M1990 Unspecified osteoarthritis, unspecified site: Secondary | ICD-10-CM

## 2011-06-06 DIAGNOSIS — I10 Essential (primary) hypertension: Secondary | ICD-10-CM

## 2011-06-06 DIAGNOSIS — E785 Hyperlipidemia, unspecified: Secondary | ICD-10-CM

## 2011-06-06 DIAGNOSIS — I739 Peripheral vascular disease, unspecified: Secondary | ICD-10-CM

## 2011-06-06 DIAGNOSIS — J449 Chronic obstructive pulmonary disease, unspecified: Secondary | ICD-10-CM

## 2011-06-06 DIAGNOSIS — R5381 Other malaise: Secondary | ICD-10-CM

## 2011-06-06 NOTE — Progress Notes (Signed)
  Subjective:    Patient ID: [name], male    DOB: 06/07/1922, 77 y.o.   MRN: [id number]  HPI The PT is here for follow up and re-evaluation of chronic medical conditions, medication management and review of any available recent lab and radiology data.  Preventive health is updated, specifically   Immunization.   Questions or concerns regarding consultations or procedures which the PT has had in the interim are  addressed. The PT denies any adverse reactions to current medications since the last visit.  Mr Steck has upcoming procedure for PVD later this week    Review of Systems See HPI Denies recent fever or chills. Denies sinus pressure, nasal congestion, ear pain or sore throat. Denies chest congestion, productive cough or wheezing.Chronic shortness of breath relying on supplemental oxygen  Denies chest pains, palpitations and leg swelling Denies abdominal pain, nausea, vomiting,diarrhea or constipation.   Denies dysuria, frequency, hesitancy or incontinence. Chronic pain and stiffness in back and knees with instability, wheelchair dependent. Denies headaches, seizures, numbness, or tingling. Denies depression, anxiety or insomnia. Denies skin break down or rash.        Objective:   Physical Exam Patient alert and oriented and in no cardiopulmonary distress.  HEENT: No facial asymmetry, EOMI, no sinus tenderness,  oropharynx pink and moist.  Neck supple no adenopathy.  Chest: Clear to auscultation bilaterally.Decreased air entry throughout  CVS: S1, S2 murmurs, no S3.  ABD: Soft non tender. Bowel sounds normal.  Ext: No edema  WU:[id number]   ROM spine, shoulders, hips and knees.  Skin: Intact, no ulcerations or rash noted.  Psych: Good eye contact, normal affect. Memory decreased not anxious or depressed appearing.  CNS: CN 2-12 intact, power, tone and sensation normal throughout.        Assessment & Plan:

## 2011-06-06 NOTE — Patient Instructions (Signed)
F/U in 3 .5 months.   STOP gabapentin,benazepril, , allopurinol    HBa1c, fasting lipid, cbc, chem 7 hepatic , tySh in 3.5 months  You need to reduce the amount of food you eat

## 2011-06-06 NOTE — H&P (Signed)
   Subjective: This 7 Years 78 Months old Male presents for of Lump on back.  Patient was referred from Dr. Anthony Sar office after a large mass was noted on his back. Patient states and present for several years. Actually 4-5 years ago it was drained by Dr. Katrinka Blazing in his office. Since that time it has slowly increased in size. He's had no drainage or fevers or chills. He does have some discomfort with lying on this area. He has aNodule on the right gluteal area. Patient was recently died his with congestive heart failure and has been recently discharged from the hospital. No nausea or vomiting. No local care is performed on this area and  Review of Symptoms:  Constitutional:unremarkable Head:unremarkable Eyes:unremarkable Nose/Mouth/Throat:unremarkable Cardiovascular:unremarkable Shortness of breath and dyspnea Gastrointestinal:unremarkable Genitourinary:unremarkable Musculoskeletal:unremarkableOsteoarthritis As per history of present illness Breast:unremarkable Hematolgic/Lymphatic:unremarkable Allergic/Immunologic:unremarkable   Past Medical History:Obtained   Past Medical History  Surgical History: Prior lancing of cyst on the back. Left iliac and femoral angioplasty and stenting, prostate biopsy, Heart placement ( porcine) Medical Problems: COPD, hypertension, osteoporosis, congestive heart failure (diastolic component), atherosclerotic vascular disease, prostate cancer with suspected involvement of the bladder wall. Psychiatric History: None Allergies: NKDA Medications: Neurontin, lovastatin, metoprolol, potassium   Social History:Obtained   Social History  Preferred Language: English ([place]) Race:  White Ethnicity: Not Hispanic / Latino Age: 77 Years 8 Months Marital Status:  W Alcohol: no Recreational drug(s): no   Smoking Status: Unknown if ever smoked  Family History:Obtained   Family History  Is there a  family history WU:[id number]    Objective Information: General:Well appearing, well nourished in no distress.Elderly obese Skin:no rash or prominent lesions Except on the upper back Where there is an approximate 5 cm fluctuant mass. Some overlying erythema. No discharge is noted. Some discomfort to deep palpation. No crepitance. Additionally on the right medial gluteal area there is a 1 cm somewhat pedunculated soft tissue mass. In the gluteal cleft there is a wound with some bleeding edges are clear. Head:Atraumatic; no masses; no abnormalities Eyes:conjunctiva clear, EOM intact, PERRL Mouth:Mucous membranes moist, no mucosal lesions. Neck:Supple without lymphadenopathy.  Heart:RRR, no murmur Lungs:CTA bilaterally, no wheezes, rhonchi, rales.  Breathing unlabored. Abdomen:Soft, NT/ND, no HSM, no masses.  Assessment:  Diagnosis &amp; Procedure: DiagnosisCode: 706.2, ProcedureCode: 30865,    Plan: Discussed management options with patient and daughter who is present during the evaluation and discussion. They do wish to proceed with lancing of the infected sebaceous cyst. Additionally discussed further treatment options for the gluteal soft tissue mass which I also think is cystic in nature. Local wound care will be continued on the gluteal cleft wound which per patient and family is improving since his hospitalization.   Cyst x2.  Again discussed with patient surgical excision.  Patient will schedule at his convenience.    Follow-up:Pending Surgery

## 2011-06-07 ENCOUNTER — Encounter (HOSPITAL_COMMUNITY): Payer: Self-pay | Admitting: Anesthesiology

## 2011-06-07 ENCOUNTER — Encounter (HOSPITAL_COMMUNITY)
Admission: RE | Disposition: A | Discharge status: Home / Self Care | Payer: Self-pay | Source: Ambulatory Visit | Attending: General Surgery

## 2011-06-07 ENCOUNTER — Ambulatory Visit (HOSPITAL_COMMUNITY): Payer: Medicare Other

## 2011-06-07 ENCOUNTER — Encounter (HOSPITAL_COMMUNITY): Payer: Self-pay | Admitting: *Deleted

## 2011-06-07 ENCOUNTER — Other Ambulatory Visit: Payer: Self-pay | Admitting: General Surgery

## 2011-06-07 ENCOUNTER — Ambulatory Visit (HOSPITAL_COMMUNITY): Payer: Medicare Other | Admitting: Anesthesiology

## 2011-06-07 ENCOUNTER — Other Ambulatory Visit: Payer: Self-pay | Admitting: *Deleted

## 2011-06-07 ENCOUNTER — Ambulatory Visit (HOSPITAL_COMMUNITY)
Admission: RE | Admit: 2011-06-07 | Discharge: 2011-06-07 | Disposition: A | Discharge status: Home / Self Care | Payer: Medicare Other | Source: Ambulatory Visit | Attending: General Surgery | Admitting: General Surgery

## 2011-06-07 DIAGNOSIS — D1779 Benign lipomatous neoplasm of other sites: Secondary | ICD-10-CM | POA: Insufficient documentation

## 2011-06-07 DIAGNOSIS — Z01812 Encounter for preprocedural laboratory examination: Secondary | ICD-10-CM | POA: Insufficient documentation

## 2011-06-07 DIAGNOSIS — I10 Essential (primary) hypertension: Secondary | ICD-10-CM | POA: Insufficient documentation

## 2011-06-07 DIAGNOSIS — J449 Chronic obstructive pulmonary disease, unspecified: Secondary | ICD-10-CM | POA: Insufficient documentation

## 2011-06-07 DIAGNOSIS — Z79899 Other long term (current) drug therapy: Secondary | ICD-10-CM | POA: Insufficient documentation

## 2011-06-07 DIAGNOSIS — L723 Sebaceous cyst: Secondary | ICD-10-CM | POA: Insufficient documentation

## 2011-06-07 DIAGNOSIS — M7989 Other specified soft tissue disorders: Secondary | ICD-10-CM

## 2011-06-07 DIAGNOSIS — J4489 Other specified chronic obstructive pulmonary disease: Secondary | ICD-10-CM | POA: Insufficient documentation

## 2011-06-07 DIAGNOSIS — Z95820 Peripheral vascular angioplasty status with implants and grafts: Secondary | ICD-10-CM

## 2011-06-07 HISTORY — PX: MASS EXCISION: SHX2000

## 2011-06-07 SURGERY — EXCISION MASS
Anesthesia: Monitor Anesthesia Care | Site: Back | Wound class: Clean

## 2011-06-07 MED ORDER — GLYCOPYRROLATE 0.2 MG/ML IJ SOLN
0.2000 mg | Freq: Once | INTRAMUSCULAR | Status: AC | PRN
  Administered 2011-06-07: 0.2 mg via INTRAVENOUS

## 2011-06-07 MED ORDER — ACETAMINOPHEN 325 MG PO TABS: 325.0000 mg | ORAL_TABLET | ORAL | Status: DC | PRN

## 2011-06-07 MED ORDER — ONDANSETRON HCL 4 MG/2ML IJ SOLN: 4.0000 mg | Freq: Once | INTRAMUSCULAR | Status: DC | PRN

## 2011-06-07 MED ORDER — CEFAZOLIN SODIUM-DEXTROSE 2-3 GM-% IV SOLR: 2.0000 g | INTRAVENOUS | Status: DC

## 2011-06-07 MED ORDER — FENTANYL CITRATE 0.05 MG/ML IJ SOLN: 25.0000 ug | INTRAMUSCULAR | Status: DC | PRN

## 2011-06-07 MED ORDER — HYDROCODONE-ACETAMINOPHEN 5-325 MG PO TABS: 1.0000 | ORAL_TABLET | ORAL | Status: AC | PRN

## 2011-06-07 MED ORDER — SODIUM CHLORIDE 0.9 % IR SOLN
Status: DC | PRN
  Administered 2011-06-07: 1000 mL

## 2011-06-07 MED ORDER — GLYCOPYRROLATE 0.2 MG/ML IJ SOLN
INTRAMUSCULAR | Status: AC
  Administered 2011-06-07: 0.2 mg via INTRAVENOUS
  Filled 2011-06-07: qty 1

## 2011-06-07 MED ORDER — LIDOCAINE-EPINEPHRINE (PF) 1 %-1:200000 IJ SOLN
INTRAMUSCULAR | Status: DC | PRN
  Administered 2011-06-07: 11 mL

## 2011-06-07 MED ORDER — MIDAZOLAM HCL 2 MG/2ML IJ SOLN
INTRAMUSCULAR | Status: AC
  Administered 2011-06-07: 2 mg via INTRAVENOUS
  Filled 2011-06-07: qty 2

## 2011-06-07 MED ORDER — ENOXAPARIN SODIUM 40 MG/0.4ML ~~LOC~~ SOLN
40.0000 mg | Freq: Once | SUBCUTANEOUS | Status: AC
  Administered 2011-06-07: 40 mg via SUBCUTANEOUS

## 2011-06-07 MED ORDER — MIDAZOLAM HCL 2 MG/2ML IJ SOLN
1.0000 mg | INTRAMUSCULAR | Status: DC | PRN
  Administered 2011-06-07: 2 mg via INTRAVENOUS

## 2011-06-07 MED ORDER — FENTANYL CITRATE 0.05 MG/ML IJ SOLN
INTRAMUSCULAR | Status: AC
  Filled 2011-06-07: qty 2

## 2011-06-07 MED ORDER — LACTATED RINGERS IV SOLN
INTRAVENOUS | Status: DC
  Administered 2011-06-07: 11:00:00 via INTRAVENOUS

## 2011-06-07 MED ORDER — CEFAZOLIN SODIUM 1-5 GM-% IV SOLN
INTRAVENOUS | Status: AC
  Filled 2011-06-07: qty 100

## 2011-06-07 MED ORDER — PROPOFOL 10 MG/ML IV EMUL
INTRAVENOUS | Status: DC | PRN
  Administered 2011-06-07: 30 ug/kg/min via INTRAVENOUS

## 2011-06-07 MED ORDER — LIDOCAINE-EPINEPHRINE (PF) 1 %-1:200000 IJ SOLN
INTRAMUSCULAR | Status: AC
  Filled 2011-06-07: qty 10

## 2011-06-07 MED ORDER — ENOXAPARIN SODIUM 40 MG/0.4ML ~~LOC~~ SOLN
SUBCUTANEOUS | Status: AC
  Administered 2011-06-07: 40 mg via SUBCUTANEOUS
  Filled 2011-06-07: qty 0.4

## 2011-06-07 MED ORDER — ONDANSETRON HCL 4 MG/2ML IJ SOLN
INTRAMUSCULAR | Status: AC
  Administered 2011-06-07: 4 mg via INTRAVENOUS
  Filled 2011-06-07: qty 2

## 2011-06-07 MED ORDER — CELECOXIB 100 MG PO CAPS: 400.0000 mg | ORAL_CAPSULE | Freq: Every day | ORAL | Status: DC

## 2011-06-07 MED ORDER — ONDANSETRON HCL 4 MG/2ML IJ SOLN
4.0000 mg | Freq: Once | INTRAMUSCULAR | Status: AC
  Administered 2011-06-07: 4 mg via INTRAVENOUS

## 2011-06-07 MED ORDER — CLOPIDOGREL BISULFATE 75 MG PO TABS: 75.0000 mg | ORAL_TABLET | Freq: Every day | ORAL | Status: DC

## 2011-06-07 SURGICAL SUPPLY — 39 items
APL SKNCLS STERI-STRIP NONHPOA (GAUZE/BANDAGES/DRESSINGS) ×1
BAG HAMPER (MISCELLANEOUS) ×2 IMPLANT
BENZOIN TINCTURE PRP APPL 2/3 (GAUZE/BANDAGES/DRESSINGS) ×2 IMPLANT
CLOSURE STERI STRIP 1/2 X4 (GAUZE/BANDAGES/DRESSINGS) ×2 IMPLANT
CLOTH BEACON ORANGE TIMEOUT ST (SAFETY) ×2 IMPLANT
COVER LIGHT HANDLE STERIS (MISCELLANEOUS) ×4 IMPLANT
DURAPREP 26ML APPLICATOR (WOUND CARE) ×2 IMPLANT
ELECT NDL TIP 2.8 STRL (NEEDLE) IMPLANT
ELECT NEEDLE TIP 2.8 STRL (NEEDLE) IMPLANT
ELECT REM PT RETURN 9FT ADLT (ELECTROSURGICAL) ×2
ELECTRODE REM PT RTRN 9FT ADLT (ELECTROSURGICAL) ×1 IMPLANT
FORMALIN 10 PREFIL 120ML (MISCELLANEOUS) ×3 IMPLANT
GLOVE BIOGEL PI IND STRL 7.0 (GLOVE) IMPLANT
GLOVE BIOGEL PI IND STRL 7.5 (GLOVE) ×1 IMPLANT
GLOVE BIOGEL PI INDICATOR 7.0 (GLOVE) ×1
GLOVE BIOGEL PI INDICATOR 7.5 (GLOVE) ×1
GLOVE ECLIPSE 6.5 STRL STRAW (GLOVE) ×2 IMPLANT
GLOVE ECLIPSE 7.0 STRL STRAW (GLOVE) ×2 IMPLANT
GLOVE INDICATOR 7.0 STRL GRN (GLOVE) ×1 IMPLANT
GOWN STRL REIN XL XLG (GOWN DISPOSABLE) ×4 IMPLANT
KIT ROOM TURNOVER APOR (KITS) ×2 IMPLANT
MANIFOLD NEPTUNE II (INSTRUMENTS) ×2 IMPLANT
NDL HYPO 18GX1.5 BLUNT FILL (NEEDLE) IMPLANT
NDL HYPO 25X1 1.5 SAFETY (NEEDLE) ×1 IMPLANT
NEEDLE HYPO 18GX1.5 BLUNT FILL (NEEDLE) IMPLANT
NEEDLE HYPO 25X1 1.5 SAFETY (NEEDLE) ×2 IMPLANT
NS IRRIG 1000ML POUR BTL (IV SOLUTION) ×2 IMPLANT
PACK MINOR (CUSTOM PROCEDURE TRAY) ×2 IMPLANT
PAD ARMBOARD 7.5X6 YLW CONV (MISCELLANEOUS) ×2 IMPLANT
SET BASIN LINEN APH (SET/KITS/TRAYS/PACK) ×2 IMPLANT
SOL PREP PROV IODINE SCRUB 4OZ (MISCELLANEOUS) IMPLANT
SPONGE GAUZE 4X4 12PLY (GAUZE/BANDAGES/DRESSINGS) ×1 IMPLANT
STRIP CLOSURE SKIN 1/2X4 (GAUZE/BANDAGES/DRESSINGS) ×2 IMPLANT
SUT MNCRL AB 4-0 PS2 18 (SUTURE) ×2 IMPLANT
SUT PROLENE 3 0 PS 1 (SUTURE) IMPLANT
SUT VIC AB 3-0 SH 27 (SUTURE) ×2
SUT VIC AB 3-0 SH 27X BRD (SUTURE) IMPLANT
SYR BULB IRRIGATION 50ML (SYRINGE) ×2 IMPLANT
SYR CONTROL 10ML LL (SYRINGE) ×2 IMPLANT

## 2011-06-07 NOTE — Anesthesia Preprocedure Evaluation (Signed)
Anesthesia Evaluation  Patient identified by MRN, date of birth, ID band Patient awake    Reviewed: Allergy & Precautions, H&P , NPO status , Patient's Chart, lab work & pertinent test results, reviewed documented beta blocker date and time   History of Anesthesia Complications Negative for: history of anesthetic complications  Airway Mallampati: I TM Distance: >3 FB Neck ROM: Full    Dental  (+) Edentulous Upper and Edentulous Lower   Pulmonary pneumonia , COPD COPD inhaler,    + decreased breath sounds      Cardiovascular hypertension, Pt. on medications and Pt. on home beta blockers + CAD, + Past MI and +CHF + dysrhythmias Atrial Fibrillation + Valvular Problems/Murmurs Irregular Normal    Neuro/Psych  Neuromuscular disease    GI/Hepatic Neg liver ROS, hiatal hernia, GERD-  ,  Endo/Other  Negative Endocrine ROS  Renal/GU negative Renal ROS     Musculoskeletal  (+) Arthritis -, Osteoarthritis,    Abdominal Normal abdominal exam  (+)   Peds  Hematology  (+) Blood dyscrasia, anemia ,   Anesthesia Other Findings   Reproductive/Obstetrics                           Anesthesia Physical Anesthesia Plan  ASA: III  Anesthesia Plan: MAC   Post-op Pain Management:    Induction:   Airway Management Planned: Nasal Cannula  Additional Equipment:   Intra-op Plan:   Post-operative Plan:   Informed Consent: I have reviewed the patients History and Physical, chart, labs and discussed the procedure including the risks, benefits and alternatives for the proposed anesthesia with the patient or authorized representative who has indicated his/her understanding and acceptance.     Plan Discussed with: CRNA  Anesthesia Plan Comments:         Anesthesia Quick Evaluation

## 2011-06-07 NOTE — Transfer of Care (Signed)
Immediate Anesthesia Transfer of Care Note  Patient: [name]  Procedure(s) Performed:  EXCISION MASS - excision of 2 masses back and buttocks  Patient Location: PACU  Anesthesia Type: MAC  Level of Consciousness: sedated  Airway & Oxygen Therapy: Patient Spontanous Breathing and Patient connected to face mask oxygen  Post-op Assessment: Report given to PACU RN, Post -op Vital signs reviewed and stable and Patient moving all extremities  Post vital signs: Reviewed and stable  Complications: No apparent anesthesia complications

## 2011-06-07 NOTE — Telephone Encounter (Signed)
Received fax to refill clopidogrel 75mg  Refill authorized

## 2011-06-07 NOTE — Op Note (Signed)
Patient:  [name]  DOB:  1921/07/04  MRN:  [id number]   Preop Diagnosis:  Sebaceous cyst of the upper back and soft tissue mass right gluteal region  Postop Diagnosis:  The same  Procedure:  #1 excision of sebaceous cyst of the upper back, #2 excision of soft tissue mass gluteal region  Surgeon:  Dr. Tilford Pillar  Anes:  MAC, 1% lidocaine with epinephrine  Indications:  Patient is an 77 year old male initially presented my office with an infectious basis of the back. This was last in the office and has since resolved. The remaining cyst is back to its normal size and the risks benefits alternatives of excision were discussed at length the patient. Additionally patient has had a soft tissue mass in the right gluteal region which is caused significant discomfort. We discussed excising both these at the same time. Risk including but not limited to risk of bleeding, infection, recurrence, skin dehiscence were discussed. His questions and concerns were addressed the patient was consented for the planned procedure.  Procedure note:  Patient is taken to the OR he is placed into a left lateral decubitus position. The MAC sedation is administered. His back and gluteal region are prepped with DuraPrep solution. Drapes are placed in standard fashion. An elliptical is incision was created around the cyst of the upper back after the local anesthetic was instilled. The initial incision was created with a 15 blade scalpel additional dissection down to subcuticular tissues carried out using electrocautery. This includes circumferentially dissecting around the cyst. Upon removal the cyst was placed in the back table and sent as a permanent specimen. Additionally on palpation in this area there is some thickened tissue. I suspect this is more likely scar tissue however to avoid remnants of the cyst this was also removed. This was sent with the initial specimen. Hemostasis is excellent. The wound is  irrigated. A Ray-Tec sponges packed into the wound. The sponges moistened and attention was then turned to the gluteal lesion.  Local anesthetic is again instilled around the gluteal lesion. An elliptical incision demonstrated with a 15 blade scalpel an additional dissection down to the subcuticular tissues carried using electrocautery. The soft tissue lesion was noted to be very superficial. Upon excising is placed in the back table and sent as a separate specimen to pathology. Hemostasis is excellent. At this time the wound is irrigated and attention was turned to closure. A 3-0 Vicryl was utilized reapproximate the deep subcuticular tissue. The upper back incision was also reapproximated with a 3-0 Vicryl to reapproximate the deep subcuticular tissue. The skin edges are reapproximated at both incisions with a running 4-0 Monocryl in a running subcuticular suture. The skin was washed and dried a moistened dry towel. Benzoin is applied around both incisions. Half-inch Steri-Strips are placed. The drapes removed the patient was allowed to come out of sedation and was transferred back to regular hospital bed. He is transferred to PACU in stable condition. At the conclusion of the procedure all instrument, sponge, needle counts are correct. The patient tolerated procedure extremely well.  Complications:  None apparent  EBL:  Minimal  Specimen:  #1 sebaceous cyst and thickened tissue of the upper back. #2 soft tissue lesion of the right gluteal area.

## 2011-06-07 NOTE — Interval H&P Note (Signed)
History and Physical Interval Note:  06/07/2011 10:51 AM  [name]  has presented today for surgery, with the diagnosis of Soft tissue mass [729.90]  The various methods of treatment have been discussed with the patient and family. After consideration of risks, benefits and other options for treatment, the patient has consented to  Procedure(s): EXCISION MASS as a surgical intervention .  The patients' history has been reviewed, patient examined, no change in status, stable for surgery.  I have reviewed the patients' chart and labs.  Questions were answered to the patient's satisfaction.     Salaam Battershell C

## 2011-06-07 NOTE — Anesthesia Postprocedure Evaluation (Signed)
  Anesthesia Post-op Note  Patient: [name]  Procedure(s) Performed:  EXCISION MASS - excision of 2 masses back and buttocks  Patient Location: PACU  Anesthesia Type: MAC  Level of Consciousness: awake, alert , oriented and patient cooperative  Airway and Oxygen Therapy: Patient Spontanous Breathing  Post-op Pain: none  Post-op Assessment: Post-op Vital signs reviewed, Patient's Cardiovascular Status Stable, Respiratory Function Stable, Patent Airway and No signs of Nausea or vomiting  Post-op Vital Signs: Reviewed and stable  Complications: No apparent anesthesia complications

## 2011-06-09 NOTE — Assessment & Plan Note (Signed)
Controlled, no change in medication  

## 2011-06-09 NOTE — Assessment & Plan Note (Signed)
Deteriorated, pt wheelchair dependent

## 2011-06-09 NOTE — Assessment & Plan Note (Signed)
Importance of dietary modification and weight loss stressed

## 2011-06-09 NOTE — Assessment & Plan Note (Signed)
Stable, pt needs to continue supplemental oxygen

## 2011-06-10 ENCOUNTER — Ambulatory Visit (HOSPITAL_COMMUNITY): Payer: Medicare Other | Admitting: Physical Therapy

## 2011-06-12 ENCOUNTER — Ambulatory Visit (HOSPITAL_COMMUNITY): Payer: Medicare Other

## 2011-06-12 ENCOUNTER — Encounter (HOSPITAL_COMMUNITY): Payer: Self-pay | Admitting: General Surgery

## 2011-06-14 ENCOUNTER — Ambulatory Visit (HOSPITAL_COMMUNITY): Payer: Medicare Other

## 2011-06-17 ENCOUNTER — Ambulatory Visit (HOSPITAL_COMMUNITY): Payer: Medicare Other | Admitting: Physical Therapy

## 2011-06-28 ENCOUNTER — Other Ambulatory Visit (HOSPITAL_COMMUNITY): Payer: Medicare Other

## 2011-07-05 ENCOUNTER — Encounter: Payer: Self-pay | Admitting: Surgery

## 2011-07-05 ENCOUNTER — Ambulatory Visit (HOSPITAL_COMMUNITY): Payer: Medicare Other | Admitting: Oncology

## 2011-07-08 ENCOUNTER — Other Ambulatory Visit (INDEPENDENT_AMBULATORY_CARE_PROVIDER_SITE_OTHER): Payer: Medicare Other | Admitting: *Deleted

## 2011-07-08 ENCOUNTER — Encounter: Payer: Self-pay | Admitting: Surgery

## 2011-07-08 ENCOUNTER — Encounter (INDEPENDENT_AMBULATORY_CARE_PROVIDER_SITE_OTHER): Payer: Medicare Other | Admitting: *Deleted

## 2011-07-08 ENCOUNTER — Ambulatory Visit (INDEPENDENT_AMBULATORY_CARE_PROVIDER_SITE_OTHER): Payer: Medicare Other | Admitting: Surgery

## 2011-07-08 VITALS — BP 113/70 | HR 84 | Resp 16 | Ht 74.0 in | Wt 243.0 lb

## 2011-07-08 DIAGNOSIS — I714 Abdominal aortic aneurysm, without rupture: Secondary | ICD-10-CM

## 2011-07-08 DIAGNOSIS — I70229 Atherosclerosis of native arteries of extremities with rest pain, unspecified extremity: Secondary | ICD-10-CM

## 2011-07-08 DIAGNOSIS — Z48812 Encounter for surgical aftercare following surgery on the circulatory system: Secondary | ICD-10-CM

## 2011-07-08 DIAGNOSIS — I739 Peripheral vascular disease, unspecified: Secondary | ICD-10-CM

## 2011-07-08 NOTE — Progress Notes (Signed)
[facility]   Patient name: MILDRED BOLLARD MRN: [id number] DOB: [birth year] Sex: male     Chief Complaint  Patient presents with  . AAA    Increased size per vascular labs done today    HISTORY OF PRESENT ILLNESS: The patient is here today for followup of his left leg stent as well as his abdominal aortic aneurysm. The patient was initially seen for left leg rest pain. He was found to have an ABI of 0.98 on the right and absent flow in the left leg. He underwent subintimal recanalization of the left leg with superficial femoral and popliteal artery stenting on 12/06/2010 I used a 7 mm Cordis stents. He has a infrarenal aneurysm measuring 4.7 cm at that time. He has complaints today of foot pain he does walk with a walker and complains of a bump or something that he will have pain from when he puts weight on his foot. He denies having any ulcers.  Past Medical History  Diagnosis Date  . Valvular heart disease   . Obesity   . Osteoarthritis   . Hypertension   . Hyperlipidemia   . COPD (chronic obstructive pulmonary disease)   . CHF (congestive heart failure) diastolic 10/2010  . Pneumonia   . Impaired glucose tolerance   . Atrial fibrillation     Chronic  . Elevated prostate specific antigen (PSA)   . Cancer     Prostate  . Ulcer     internal-net placed and has been fixed  . Leg pain   . GERD (gastroesophageal reflux disease)   . Chronic kidney disease   . CAD (coronary artery disease)   . Hiatal hernia   . Hard of hearing     Past Surgical History  Procedure Date  . Left knee   . Ulcer surgery   . Bladder surgery   . Coronary artery bypass graft   . Rca with stent replacement      1994  . Left sfa stent 12-06-10    Stenting of left superficial femoral artery and popliteal artery  . Appendectomy   . Cardiac valve replacement 2003  . Eye surgery 2007    bilateral cataracts  . Tonsillectomy     thinks they were removed while in the  [employer]  . Mass excision 06/07/2011    Procedure: EXCISION MASS;  Surgeon: Fabio Bering;  Location: [facility];  Service: General;  Laterality: N/A;  excision of 2 masses back and buttocks    History   Social History  . Marital Status: Widowed    Spouse Name: N/A    Number of Children: 4  . Years of Education: N/A   Occupational History  . Retired    Social History Main Topics  . Smoking status: Former Smoker -- 0.0 packs/day for 76 years    Types: Cigarettes    Quit date: 06/24/2010  . Smokeless tobacco: Not on file  . Alcohol Use: No  . Drug Use: No  . Sexually Active: Not on file   Other Topics Concern  . Not on file   Social History Narrative   Has 3 children living.1 son deceased at age 12 due to drunk driving.    Family History  Problem Relation Age of Onset  . Stroke Mother   . Stroke Brother   . Heart attack Brother   . Cancer Brother   . Cancer Brother   . Anesthesia problems Neg Hx   . Hypotension  Neg Hx   . Malignant hyperthermia Neg Hx   . Pseudochol deficiency Neg Hx     Allergies as of 07/08/2011 - Review Complete 07/08/2011  Allergen Reaction Noted  . Gabapentin  06/04/2011    Current Outpatient Prescriptions on File Prior to Visit  Medication Sig Dispense Refill  . ADVAIR DISKUS 100-50 MCG/DOSE AEPB inhale 1 dose by mouth twice a day  60 each  2  . albuterol (PROVENTIL) (2.5 MG/3ML) 0.083% nebulizer solution USE 1 VIAL IN NEBULIZER EVERY 6 HOURS AS NEEDED.  300 mL  2  . aspirin 81 MG EC tablet Take 81 mg by mouth daily.        . clopidogrel (PLAVIX) 75 MG tablet Take 1 tablet (75 mg total) by mouth daily.  30 tablet  5  . furosemide (LASIX) 20 MG tablet Take 1 tablet (20 mg total) by mouth daily.  30 tablet  1  . HYDROcodone-acetaminophen (VICODIN) 5-500 MG per tablet TAKE 1 TABLET BY MOUTH AT BEDTIME IF NEEDED FOR PAIN  30 tablet  1  . ipratropium (ATROVENT) 0.02 % nebulizer solution Take 2.5 mLs (500 mcg total) by nebulization every 6 (six)  hours.  75 mL  4  . lovastatin (MEVACOR) 40 MG tablet Take 1 tablet (40 mg total) by mouth as directed. On Sunday, Monday, Wednesday & friday  30 tablet  5  . metoprolol (LOPRESSOR) 50 MG tablet Take 0.5 tablets (25 mg total) by mouth 2 (two) times daily.  60 tablet  5  . multivitamin (THERAGRAN) per tablet Take 1 tablet by mouth daily.        . potassium chloride SA (KLOR-CON M20) 20 MEQ tablet Take 1 tablet (20 mEq total) by mouth daily.  30 tablet  5  . PRILOSEC 40 MG capsule TAKE ONE CAPSULE DAILY.  30 each  4   Current Facility-Administered Medications on File Prior to Visit  Medication Dose Route Frequency Provider Last Rate Last Dose  . Influenza (>/= 3 years) inactive virus vaccine (FLVIRIN/FLUZONE) injection SUSP 0.5 mL  0.5 mL Intramuscular Once [name], MD         REVIEW OF SYSTEMS: No changes from prior visit  PHYSICAL EXAMINATION:   Vital signs are BP 113/70  Pulse 84  Resp 16  Ht 6\' 2"  (1.88 m)  Wt 243 lb (110.224 kg)  BMI 31.20 kg/m2  SpO2 81% General: The patient appears their stated age. HEENT:  No gross abnormalities Pulmonary:  Non labored breathing Abdomen: Soft and non-tender aneurysm is nontender Musculoskeletal: There are no major deformities. Neurologic: No focal weakness or paresthesias are detected, Skin: There are no ulcer or rashes noted. Psychiatric: The patient has normal affect. Cardiovascular: Pedal pulses are not palpable on the left secondary to edema there are no ulcerations.   Diagnostic Studies ABI on the right is 1.4 on the left is 0.96 the stents are widely patent without evidence of restenosis. Date Abdominal ultrasound shows dramatic increase in the size of his aneurysm it now measures greater than 8 cm. 6 months ago it measured 4.7 cm  Assessment: Status post left leg stenting  Abdominal aortic aneurysm Plan: Left leg stenting: The patient is doing very well at this time. He has no ulcerations. He has excellent Doppler  evaluation. He was placed on her ultrasound protocol. With regards to the pain in his left foot I'm going to get x-rays of his left foot to rule out some form of a bone spur. This could also represent  plantar fasciitis. Abdominal aortic aneurysm. There has been a dramatic increase in the size of his aneurysm. Certainly with this kind of growth over the course of 6 months he is at high risk for rupture. I did discuss this with him and his daughter. They understand about date nonsurvivable events. He is not a candidate for an open operation based on his pulmonary status age and comorbidities. He may be a candidate for endovascular repair was to be done potentially under local anesthesia. I'm getting a CT angiogram today to further evaluate this. This will likely be without contrast given his known renal insufficiency. I will contact him with the results and make plans for repair based on the findings of the CT scan  V. Charlena Cross, M.D. [facility] [place] Office: [phone] Pager:  [phone]

## 2011-07-09 ENCOUNTER — Other Ambulatory Visit: Payer: Self-pay | Admitting: Surgery

## 2011-07-09 ENCOUNTER — Telehealth: Payer: Self-pay | Admitting: Family Medicine

## 2011-07-09 ENCOUNTER — Ambulatory Visit
Admission: RE | Admit: 2011-07-09 | Discharge: 2011-07-09 | Disposition: A | Discharge status: Home / Self Care | Payer: Medicare Other | Source: Ambulatory Visit | Attending: Surgery | Admitting: Surgery

## 2011-07-09 DIAGNOSIS — I714 Abdominal aortic aneurysm, without rupture: Secondary | ICD-10-CM

## 2011-07-09 DIAGNOSIS — I739 Peripheral vascular disease, unspecified: Secondary | ICD-10-CM

## 2011-07-09 MED ORDER — IOHEXOL 350 MG/ML SOLN
125.0000 mL | Freq: Once | INTRAVENOUS | Status: AC | PRN
  Administered 2011-07-09: 125 mL via INTRAVENOUS

## 2011-07-09 NOTE — Telephone Encounter (Signed)
Called and ordered oxygen through [employer].

## 2011-07-10 NOTE — Progress Notes (Signed)
Addended by: Holley Raring on: 07/10/2011 12:48 PM   Modules accepted: Orders, Level of Service

## 2011-07-13 ENCOUNTER — Other Ambulatory Visit: Payer: Self-pay | Admitting: Family Medicine

## 2011-07-15 ENCOUNTER — Other Ambulatory Visit: Payer: Self-pay | Admitting: Family Medicine

## 2011-07-15 ENCOUNTER — Telehealth: Payer: Self-pay | Admitting: Family Medicine

## 2011-07-15 DIAGNOSIS — M79672 Pain in left foot: Secondary | ICD-10-CM

## 2011-07-15 NOTE — Telephone Encounter (Signed)
pls refer to podiatrist re foot pain  And questionable toe fracture . Let daughter know when done . I am entering the referral

## 2011-07-15 NOTE — Telephone Encounter (Signed)
Pt has appt and daughter is aware

## 2011-07-19 ENCOUNTER — Other Ambulatory Visit: Payer: Self-pay | Admitting: Family Medicine

## 2011-07-29 NOTE — Procedures (Unsigned)
LOWER EXTREMITY ARTERIAL EVALUATION-SINGLE LEVEL  INDICATION:  Followup left SFA and popliteal stents placed 12/06/2010  HISTORY: Diabetes:  No Cardiac:  Yes Hypertension:  Yes Smoking:  Previously Previous Surgery:  RESTING SYSTOLIC PRESSURES: (ABI)                         RIGHT                LEFT Brachial: Anterior tibial: Posterior tibial: Peroneal: DOPPLER WAVEFORM ANALYSIS: Anterior tibial: Posterior tibial: Peroneal:  PREVIOUS ABI'S:  Date: 04/03/2011  RIGHT:  1.10  LEFT:  1.08 Today's ABI's:  RIGHT:  1.40  LEFT:  0.96  DUPLEX:  Widely patent left lower extremity stents with triphasic waveforms throughout  IMPRESSION:  Widely patent left lower extremity superficial femoral artery and popliteal stents without restenosis.  ___________________________________________ V. Charlena Cross, MD  SS/MEDQ  D:  07/09/2011  T:  07/09/2011  Job:  [id number]

## 2011-07-29 NOTE — Procedures (Unsigned)
DUPLEX ULTRASOUND OF ABDOMINAL AORTA  INDICATION:  Followup aortic aneurysm  HISTORY: Infrarenal aneurysm with maximum diameter 4.7, hyperdense lesion in the right kidney, history of atrial fibrillation, status post mitral valve replacement  Diabetes:  No Cardiac:  Yes Hypertension:  Yes Smoking:  Previously Connective Tissue Disorder: Family History: Previous Surgery:  Left SFA and popliteal stents 12/06/2010  DUPLEX EXAM:         AP (cm)                   TRANSVERSE (cm) Proximal             3.85 cm                   4.68 cm Mid                  3.85 cm                   4.11 cm Distal               7.83 cm                   8.06 cm Right Iliac          Not visualized            Not visualized Left Iliac           Not visualized            Not visualized  PREVIOUS:  Date:  CT 12/06/2010  AP:  4.7  TRANSVERSE:  IMPRESSION:  Significant increase in size of the infrarenal aneurysm measuring 7.83 cm.  ___________________________________________ V. Charlena Cross, MD  SS/MEDQ  D:  07/09/2011  T:  07/09/2011  Job:  [id number]

## 2011-07-30 ENCOUNTER — Encounter (HOSPITAL_COMMUNITY): Payer: Medicare Other | Attending: Oncology

## 2011-07-30 DIAGNOSIS — D508 Other iron deficiency anemias: Secondary | ICD-10-CM | POA: Insufficient documentation

## 2011-07-30 LAB — CBC
HCT: 27.9 % — ABNORMAL LOW (ref 39.0–52.0)
Hemoglobin: 7.6 g/dL — ABNORMAL LOW (ref 13.0–17.0)
WBC: 6.6 10*3/uL (ref 4.0–10.5)

## 2011-07-30 NOTE — Progress Notes (Signed)
[name] presented for labwork. Labs per MD order drawn via Peripheral Line 25 gauge needle inserted in lt ac.  Good blood return present. Procedure without incident.  Needle removed intact. Patient tolerated procedure well.

## 2011-07-31 LAB — IRON AND TIBC
Iron: 15 ug/dL — ABNORMAL LOW (ref 42–135)
Saturation Ratios: 4 % — ABNORMAL LOW (ref 20–55)
TIBC: 391 ug/dL (ref 215–435)
UIBC: 376 ug/dL (ref 125–400)

## 2011-08-01 ENCOUNTER — Encounter (HOSPITAL_BASED_OUTPATIENT_CLINIC_OR_DEPARTMENT_OTHER): Payer: Medicare Other | Admitting: Oncology

## 2011-08-01 ENCOUNTER — Encounter (HOSPITAL_COMMUNITY): Payer: Self-pay | Admitting: Oncology

## 2011-08-01 ENCOUNTER — Encounter (HOSPITAL_BASED_OUTPATIENT_CLINIC_OR_DEPARTMENT_OTHER): Payer: Medicare Other

## 2011-08-01 VITALS — BP 116/66 | HR 64 | Temp 97.0°F | Wt 251.8 lb

## 2011-08-01 DIAGNOSIS — J449 Chronic obstructive pulmonary disease, unspecified: Secondary | ICD-10-CM

## 2011-08-01 DIAGNOSIS — D509 Iron deficiency anemia, unspecified: Secondary | ICD-10-CM

## 2011-08-01 DIAGNOSIS — D508 Other iron deficiency anemias: Secondary | ICD-10-CM

## 2011-08-01 DIAGNOSIS — K909 Intestinal malabsorption, unspecified: Secondary | ICD-10-CM

## 2011-08-01 MED ORDER — SODIUM CHLORIDE 0.9 % IV SOLN
1020.0000 mg | Freq: Once | INTRAVENOUS | Status: AC
  Administered 2011-08-01: 1020 mg via INTRAVENOUS
  Filled 2011-08-01: qty 34

## 2011-08-01 MED ORDER — SODIUM CHLORIDE 0.9 % IV SOLN
Freq: Once | INTRAVENOUS | Status: AC
  Administered 2011-08-01: 15:00:00 via INTRAVENOUS

## 2011-08-01 MED ORDER — SODIUM CHLORIDE 0.9 % IJ SOLN
10.0000 mL | Freq: Once | INTRAMUSCULAR | Status: AC
  Administered 2011-08-01: 10 mL via INTRAVENOUS
  Filled 2011-08-01: qty 10

## 2011-08-01 NOTE — Progress Notes (Signed)
[name], MD, MD [address], [address] [place] [address]  1. IRON DEFIC ANEMIA SEC DIET IRON INTAKE  ferumoxytol (FERAHEME) 1,020 mg in sodium chloride 0.9 % 100 mL IVPB, CBC, Ferritin, CBC, Ferritin, Iron and TIBC    CURRENT THERAPY: Intermittent IV iron replacement  INTERVAL HISTORY: [name] 78 y.o. male returns for  regular  visit for followup of iron deficiency anemia.  The patient and his daughter report a significant change in his energy level.  He reports that he sleep approximately 20 hours per day.  His breathing has gotten worse.  He is seen today with an oxygen nasal cannula.  His skin color is moderately pale, however, this is the first time I am seeing the patient.   I personally reviewed and went over laboratory results with the patient.  His ferritin is low at 8, he is anemic with a microcytic red cell.  We will therefore give him Feraheme 1020 mg.  We spent a significant amount of time discussing his other medical issues including his COPD, peripheral vascular disease, history of LE ulcers, the history of almost becoming an amputee due to LE ulcers that nearly became gangrenous, and the insertion of LE stents to prevent the amputation.   Past Medical History  Diagnosis Date  . Valvular heart disease   . Obesity   . Osteoarthritis   . Hypertension   . Hyperlipidemia   . COPD (chronic obstructive pulmonary disease)   . CHF (congestive heart failure) diastolic 10/2010  . Pneumonia   . Impaired glucose tolerance   . Atrial fibrillation     Chronic  . Elevated prostate specific antigen (PSA)   . Cancer     Prostate  . Ulcer     internal-net placed and has been fixed  . Leg pain   . GERD (gastroesophageal reflux disease)   . Chronic kidney disease   . CAD (coronary artery disease)   . Hiatal hernia   . Hard of hearing   . Anemia     has PROSTATE CANCER; IMPAIRED GLUCOSE TOLERANCE; HYPERLIPIDEMIA; OBESITY; IRON DEFIC ANEMIA SEC DIET IRON  INTAKE; HYPERTENSION; VALVULAR HEART DISEASE; ATRIAL FIBRILLATION, CHRONIC; OSTEOARTHRITIS; KNEE, ARTHRITIS, DEGEN./OSTEO; IMPAIRED FASTING GLUCOSE; COPD; Diastolic heart failure; Decubitus ulcers; Respiratory failure, acute and chronic; Peripheral vascular disease; NSTEMI (non-ST elevated myocardial infarction); Foot pain, left; COPD (chronic obstructive pulmonary disease); Difficulty in walking; and Balance problem on his problem list.     is allergic to gabapentin.  Mr. Risdon had no medications administered during this visit.  Past Surgical History  Procedure Date  . Left knee   . Ulcer surgery   . Bladder surgery   . Coronary artery bypass graft   . Rca with stent replacement      1994  . Left sfa stent 12-06-10    Stenting of left superficial femoral artery and popliteal artery  . Appendectomy   . Cardiac valve replacement 2003  . Eye surgery 2007    bilateral cataracts  . Tonsillectomy     thinks they were removed while in the [employer]  . Mass excision 06/07/2011    Procedure: EXCISION MASS;  Surgeon: Fabio Bering;  Location: [facility];  Service: General;  Laterality: N/A;  excision of 2 masses back and buttocks    Denies any headaches, dizziness, double vision, fevers, chills, night sweats, nausea, vomiting, diarrhea, constipation, chest pain, heart palpitations, blood in stool, black tarry stool, urinary pain, urinary burning, urinary frequency, hematuria.  PHYSICAL EXAMINATION  ECOG PERFORMANCE STATUS: 3 - Symptomatic, >50% confined to bed  Filed Vitals:   08/01/11 1349  BP: 116/66  Pulse: 64  Temp: 97 F (36.1 C)    GENERAL:alert, no distress, well nourished, well developed, comfortable, cooperative, obese, smiling and nasal cannula in place, seen in wheelchair. SKIN: skin color, texture, turgor are normal, no rashes or significant lesions HEAD: Normocephalic, No masses, lesions, tenderness or abnormalities EYES: normal EARS: External ears  normal OROPHARYNX:mucous membranes are moist  NECK: supple, trachea midline LYMPH:  no palpable lymphadenopathy BREAST:not examined LUNGS: clear to auscultation and percussion, decreased breath sounds HEART: regular rate & rhythm, no murmurs, no gallops, S1 normal, S2 normal and occasional dropped heart sound ABDOMEN:abdomen soft, non-tender, obese, normal bowel sounds and unable to assess for hepatosplenomegaly due patient positioning EXTREMITIES:no edema  NEURO: alert & oriented x 3 with fluent speech    LABORATORY DATA: CBC    Component Value Date/Time   WBC 6.6 07/30/2011 1015   RBC 3.60* 07/30/2011 1015   HGB 7.6* 07/30/2011 1015   HCT 27.9* 07/30/2011 1015   PLT 199 07/30/2011 1015   MCV 77.5* 07/30/2011 1015   MCH 21.1* 07/30/2011 1015   MCHC 27.2* 07/30/2011 1015   RDW 17.1* 07/30/2011 1015   LYMPHSABS 0.9 06/04/2011 1000   MONOABS 1.1* 06/04/2011 1000   EOSABS 0.3 06/04/2011 1000   BASOSABS 0.1 06/04/2011 1000    Lab Results  Component Value Date   IRON 15* 07/30/2011   TIBC 391 07/30/2011   FERRITIN 8* 07/30/2011      ASSESSMENT:  1. Iron deficiency anemia, secondary to poor absorption 2. COPD 3. Obesity 4. Weakness, fatigue, tiredness, SOB   PLAN:  1. Will administer Feraheme 1020 mg today 2. Discussed risks, benefits, and alternatives to [facility]. Discussed adverse reactions of Feraheme including allergic reaction, anaphylaxis, reaction at injection site, infection, and bleeding. 3. Lab work in one month: CBC ferritin 4. Lab work in 3 months: CBC ferritin iron/TIBC 5. I personally reviewed and went over laboratory results with the patient. 6. Return in 3 months for follow-up   All questions were answered. The patient knows to call the clinic with any problems, questions or concerns. We can certainly see the patient much sooner if necessary.  I spent 15 minutes counseling the patient face to face. The total time spent in the appointment was 25 minutes.  KEFALAS,THOMAS

## 2011-08-01 NOTE — Patient Instructions (Signed)
[facility]  Discharge Instructions [name]  [id number] 11-30-1921  RECOMMENDATIONS MADE BY THE CONSULTANT AND ANY TEST RESULTS WILL BE SENT TO YOUR REFERRING DOCTOR.   EXAM FINDINGS BY MD TODAY AND SIGNS AND SYMPTOMS TO REPORT TO CLINIC OR PRIMARY MD: Findings as discussed by T. Jacalyn Lefevre, PA-C.  You are receiving your Feraheme injection today.  Please keep your follow-up appointments for labs and office visits.  I acknowledge that I have been informed and understand all the instructions given to me and received a copy. I do not have any more questions at this time, but understand that I may call the Specialty Clinic at [facility] at [phone] during business hours should I have any further questions or need assistance in obtaining follow-up care.    __________________________________________  _____________  __________ Signature of Patient or Authorized Representative            Date                   Time    __________________________________________ Nurse's Signature

## 2011-08-01 NOTE — Progress Notes (Signed)
Patient tolerated Feraheme infusion well.  IV d/c'd, site unremarkable.  Patient and daughter advised of follow-up.

## 2011-08-05 ENCOUNTER — Telehealth: Payer: Self-pay | Admitting: Family Medicine

## 2011-08-05 NOTE — Telephone Encounter (Signed)
Also let pt know if his oxygen is too low he needs to be evaluated either at the hospital if too low, or I can refer him for dr Juanetta Gosling to re eval this let me know pls

## 2011-08-05 NOTE — Telephone Encounter (Signed)
pls refer for advanced to do eval for pt with copd, hypoxia, severe osteoarthritis with limitation in mobility, let daughter know he is referred pls

## 2011-08-06 ENCOUNTER — Emergency Department (HOSPITAL_COMMUNITY): Payer: Medicare Other

## 2011-08-06 ENCOUNTER — Encounter (HOSPITAL_COMMUNITY): Payer: Self-pay | Admitting: Emergency Medicine

## 2011-08-06 ENCOUNTER — Inpatient Hospital Stay (HOSPITAL_COMMUNITY)
Admission: EM | Admit: 2011-08-06 | Discharge: 2011-08-09 | DRG: 291 | Disposition: A | Discharge status: Skilled Nursing Facility | Payer: Medicare Other | Attending: Internal Medicine | Admitting: Internal Medicine

## 2011-08-06 ENCOUNTER — Other Ambulatory Visit: Payer: Self-pay

## 2011-08-06 DIAGNOSIS — R0602 Shortness of breath: Secondary | ICD-10-CM

## 2011-08-06 DIAGNOSIS — Z888 Allergy status to other drugs, medicaments and biological substances status: Secondary | ICD-10-CM

## 2011-08-06 DIAGNOSIS — R05 Cough: Secondary | ICD-10-CM | POA: Diagnosis present

## 2011-08-06 DIAGNOSIS — Z87891 Personal history of nicotine dependence: Secondary | ICD-10-CM

## 2011-08-06 DIAGNOSIS — D649 Anemia, unspecified: Secondary | ICD-10-CM

## 2011-08-06 DIAGNOSIS — N390 Urinary tract infection, site not specified: Secondary | ICD-10-CM | POA: Diagnosis not present

## 2011-08-06 DIAGNOSIS — I5033 Acute on chronic diastolic (congestive) heart failure: Principal | ICD-10-CM | POA: Diagnosis present

## 2011-08-06 DIAGNOSIS — R410 Disorientation, unspecified: Secondary | ICD-10-CM

## 2011-08-06 DIAGNOSIS — Z8546 Personal history of malignant neoplasm of prostate: Secondary | ICD-10-CM

## 2011-08-06 DIAGNOSIS — D509 Iron deficiency anemia, unspecified: Secondary | ICD-10-CM | POA: Diagnosis present

## 2011-08-06 DIAGNOSIS — I129 Hypertensive chronic kidney disease with stage 1 through stage 4 chronic kidney disease, or unspecified chronic kidney disease: Secondary | ICD-10-CM | POA: Diagnosis present

## 2011-08-06 DIAGNOSIS — Z951 Presence of aortocoronary bypass graft: Secondary | ICD-10-CM

## 2011-08-06 DIAGNOSIS — J9620 Acute and chronic respiratory failure, unspecified whether with hypoxia or hypercapnia: Secondary | ICD-10-CM | POA: Diagnosis present

## 2011-08-06 DIAGNOSIS — H9190 Unspecified hearing loss, unspecified ear: Secondary | ICD-10-CM | POA: Diagnosis present

## 2011-08-06 DIAGNOSIS — I509 Heart failure, unspecified: Secondary | ICD-10-CM | POA: Diagnosis present

## 2011-08-06 DIAGNOSIS — J90 Pleural effusion, not elsewhere classified: Secondary | ICD-10-CM

## 2011-08-06 DIAGNOSIS — Z79899 Other long term (current) drug therapy: Secondary | ICD-10-CM

## 2011-08-06 DIAGNOSIS — Z9861 Coronary angioplasty status: Secondary | ICD-10-CM

## 2011-08-06 DIAGNOSIS — J4489 Other specified chronic obstructive pulmonary disease: Secondary | ICD-10-CM | POA: Diagnosis present

## 2011-08-06 DIAGNOSIS — N189 Chronic kidney disease, unspecified: Secondary | ICD-10-CM | POA: Diagnosis present

## 2011-08-06 DIAGNOSIS — J441 Chronic obstructive pulmonary disease with (acute) exacerbation: Secondary | ICD-10-CM | POA: Diagnosis present

## 2011-08-06 DIAGNOSIS — K449 Diaphragmatic hernia without obstruction or gangrene: Secondary | ICD-10-CM | POA: Diagnosis present

## 2011-08-06 DIAGNOSIS — Z9849 Cataract extraction status, unspecified eye: Secondary | ICD-10-CM

## 2011-08-06 DIAGNOSIS — I2510 Atherosclerotic heart disease of native coronary artery without angina pectoris: Secondary | ICD-10-CM | POA: Diagnosis present

## 2011-08-06 DIAGNOSIS — Z95820 Peripheral vascular angioplasty status with implants and grafts: Secondary | ICD-10-CM

## 2011-08-06 DIAGNOSIS — I359 Nonrheumatic aortic valve disorder, unspecified: Secondary | ICD-10-CM

## 2011-08-06 DIAGNOSIS — R059 Cough, unspecified: Secondary | ICD-10-CM | POA: Diagnosis present

## 2011-08-06 DIAGNOSIS — Z9981 Dependence on supplemental oxygen: Secondary | ICD-10-CM

## 2011-08-06 DIAGNOSIS — K219 Gastro-esophageal reflux disease without esophagitis: Secondary | ICD-10-CM | POA: Diagnosis present

## 2011-08-06 DIAGNOSIS — E785 Hyperlipidemia, unspecified: Secondary | ICD-10-CM | POA: Diagnosis present

## 2011-08-06 DIAGNOSIS — Z954 Presence of other heart-valve replacement: Secondary | ICD-10-CM

## 2011-08-06 DIAGNOSIS — Z7982 Long term (current) use of aspirin: Secondary | ICD-10-CM

## 2011-08-06 DIAGNOSIS — J449 Chronic obstructive pulmonary disease, unspecified: Secondary | ICD-10-CM | POA: Diagnosis present

## 2011-08-06 DIAGNOSIS — M1990 Unspecified osteoarthritis, unspecified site: Secondary | ICD-10-CM | POA: Diagnosis present

## 2011-08-06 DIAGNOSIS — I739 Peripheral vascular disease, unspecified: Secondary | ICD-10-CM | POA: Diagnosis present

## 2011-08-06 LAB — URINALYSIS, ROUTINE W REFLEX MICROSCOPIC
Glucose, UA: NEGATIVE mg/dL
Leukocytes, UA: NEGATIVE
Nitrite: NEGATIVE
pH: 6 (ref 5.0–8.0)

## 2011-08-06 LAB — CBC
Platelets: 222 10*3/uL (ref 150–400)
RDW: 19.5 % — ABNORMAL HIGH (ref 11.5–15.5)
WBC: 8.3 10*3/uL (ref 4.0–10.5)

## 2011-08-06 LAB — URINE MICROSCOPIC-ADD ON

## 2011-08-06 LAB — BASIC METABOLIC PANEL
CO2: 32 mEq/L (ref 19–32)
Calcium: 9.1 mg/dL (ref 8.4–10.5)
Chloride: 103 mEq/L (ref 96–112)
GFR calc Af Amer: 76 mL/min — ABNORMAL LOW (ref >90)
Sodium: 142 mEq/L (ref 135–145)

## 2011-08-06 LAB — DIFFERENTIAL
Basophils Absolute: 0.1 10*3/uL (ref 0.0–0.1)
Lymphocytes Relative: 10 % — ABNORMAL LOW (ref 12–46)
Neutro Abs: 5.9 10*3/uL (ref 1.7–7.7)

## 2011-08-06 LAB — PRO B NATRIURETIC PEPTIDE: Pro B Natriuretic peptide (BNP): 2781 pg/mL — ABNORMAL HIGH (ref 0–450)

## 2011-08-06 MED ORDER — ADULT MULTIVITAMIN W/MINERALS CH
1.0000 | ORAL_TABLET | Freq: Every day | ORAL | Status: DC
  Administered 2011-08-07: 11:00:00 via ORAL
  Administered 2011-08-08 – 2011-08-09 (×2): 1 via ORAL
  Filled 2011-08-06 (×3): qty 1

## 2011-08-06 MED ORDER — SODIUM CHLORIDE 0.9 % IJ SOLN: 3.0000 mL | INTRAMUSCULAR | Status: DC | PRN

## 2011-08-06 MED ORDER — NITROGLYCERIN 2 % TD OINT
1.0000 [in_us] | TOPICAL_OINTMENT | Freq: Once | TRANSDERMAL | Status: AC
  Administered 2011-08-06: 1 [in_us] via TOPICAL
  Filled 2011-08-06: qty 1

## 2011-08-06 MED ORDER — FUROSEMIDE 10 MG/ML IJ SOLN
80.0000 mg | Freq: Once | INTRAMUSCULAR | Status: AC
  Administered 2011-08-06: 80 mg via INTRAVENOUS
  Filled 2011-08-06: qty 8

## 2011-08-06 MED ORDER — CLOPIDOGREL BISULFATE 75 MG PO TABS
75.0000 mg | ORAL_TABLET | Freq: Every day | ORAL | Status: DC
  Administered 2011-08-07 – 2011-08-09 (×3): 75 mg via ORAL
  Filled 2011-08-06 (×3): qty 1

## 2011-08-06 MED ORDER — ALBUTEROL SULFATE (5 MG/ML) 0.5% IN NEBU: 2.5000 mg | INHALATION_SOLUTION | Freq: Four times a day (QID) | RESPIRATORY_TRACT | Status: DC | PRN

## 2011-08-06 MED ORDER — SIMVASTATIN 20 MG PO TABS
20.0000 mg | ORAL_TABLET | Freq: Every day | ORAL | Status: DC
  Administered 2011-08-07 – 2011-08-08 (×2): 20 mg via ORAL
  Filled 2011-08-06 (×2): qty 1

## 2011-08-06 MED ORDER — PANTOPRAZOLE SODIUM 40 MG PO TBEC
40.0000 mg | DELAYED_RELEASE_TABLET | Freq: Every day | ORAL | Status: DC
  Administered 2011-08-07 – 2011-08-09 (×3): 40 mg via ORAL
  Filled 2011-08-06 (×3): qty 1

## 2011-08-06 MED ORDER — FLUTICASONE-SALMETEROL 100-50 MCG/DOSE IN AEPB
1.0000 | INHALATION_SPRAY | Freq: Two times a day (BID) | RESPIRATORY_TRACT | Status: DC
  Administered 2011-08-07 – 2011-08-09 (×5): 1 via RESPIRATORY_TRACT
  Filled 2011-08-06: qty 14

## 2011-08-06 MED ORDER — FLUTICASONE-SALMETEROL 100-50 MCG/DOSE IN AEPB
INHALATION_SPRAY | RESPIRATORY_TRACT | Status: AC
  Filled 2011-08-06: qty 14

## 2011-08-06 MED ORDER — IPRATROPIUM BROMIDE 0.02 % IN SOLN: 0.5000 mg | Freq: Four times a day (QID) | RESPIRATORY_TRACT | Status: DC | PRN

## 2011-08-06 MED ORDER — HYDROCODONE-ACETAMINOPHEN 5-325 MG PO TABS: 1.0000 | ORAL_TABLET | Freq: Four times a day (QID) | ORAL | Status: DC | PRN

## 2011-08-06 MED ORDER — SODIUM CHLORIDE 0.9 % IJ SOLN
3.0000 mL | Freq: Two times a day (BID) | INTRAMUSCULAR | Status: DC
  Administered 2011-08-06 – 2011-08-09 (×6): 3 mL via INTRAVENOUS
  Filled 2011-08-06 (×3): qty 3

## 2011-08-06 MED ORDER — SODIUM CHLORIDE 0.9 % IV SOLN: 250.0000 mL | INTRAVENOUS | Status: DC | PRN

## 2011-08-06 MED ORDER — ONDANSETRON HCL 4 MG/2ML IJ SOLN: 4.0000 mg | Freq: Four times a day (QID) | INTRAMUSCULAR | Status: DC | PRN

## 2011-08-06 MED ORDER — ACETAMINOPHEN 325 MG PO TABS: 650.0000 mg | ORAL_TABLET | ORAL | Status: DC | PRN

## 2011-08-06 MED ORDER — METOPROLOL TARTRATE 25 MG PO TABS
25.0000 mg | ORAL_TABLET | Freq: Two times a day (BID) | ORAL | Status: DC
  Administered 2011-08-07 – 2011-08-09 (×5): 25 mg via ORAL
  Filled 2011-08-06 (×6): qty 1

## 2011-08-06 MED ORDER — ENOXAPARIN SODIUM 30 MG/0.3ML ~~LOC~~ SOLN
30.0000 mg | SUBCUTANEOUS | Status: DC
  Administered 2011-08-06 – 2011-08-07 (×2): 30 mg via SUBCUTANEOUS
  Filled 2011-08-06 (×2): qty 0.3

## 2011-08-06 MED ORDER — ASPIRIN EC 81 MG PO TBEC
81.0000 mg | DELAYED_RELEASE_TABLET | Freq: Every day | ORAL | Status: DC
  Administered 2011-08-07 – 2011-08-09 (×3): 81 mg via ORAL
  Filled 2011-08-06 (×3): qty 1

## 2011-08-06 MED ORDER — FUROSEMIDE 10 MG/ML IJ SOLN
40.0000 mg | Freq: Two times a day (BID) | INTRAMUSCULAR | Status: DC
  Administered 2011-08-06 – 2011-08-08 (×4): 40 mg via INTRAVENOUS
  Filled 2011-08-06 (×4): qty 4

## 2011-08-06 MED ORDER — POTASSIUM CHLORIDE CRYS ER 20 MEQ PO TBCR
20.0000 meq | EXTENDED_RELEASE_TABLET | Freq: Every day | ORAL | Status: DC
  Administered 2011-08-07 – 2011-08-09 (×3): 20 meq via ORAL
  Filled 2011-08-06 (×3): qty 1

## 2011-08-06 MED ORDER — ALBUTEROL SULFATE (5 MG/ML) 0.5% IN NEBU
5.0000 mg | INHALATION_SOLUTION | Freq: Once | RESPIRATORY_TRACT | Status: AC
  Administered 2011-08-06: 5 mg via RESPIRATORY_TRACT
  Filled 2011-08-06: qty 1

## 2011-08-06 MED ORDER — IPRATROPIUM BROMIDE 0.02 % IN SOLN
0.5000 mg | Freq: Once | RESPIRATORY_TRACT | Status: AC
  Administered 2011-08-06: 0.5 mg via RESPIRATORY_TRACT
  Filled 2011-08-06: qty 2.5

## 2011-08-06 NOTE — ED Notes (Signed)
Report given by Chandra Batch, RN

## 2011-08-06 NOTE — ED Provider Notes (Cosign Needed)
History   This chart was scribed for Ward Givens, MD by Clarita Crane. The patient was seen in room APA16A/APA16A. Patient's care was started at 1002.  CSN: [id number]  Arrival date & time 08/06/11  1002   First MD Initiated Contact with Patient 08/06/11 1019      Chief Complaint  Patient presents with  . Weakness    (Consider location/radiation/quality/duration/timing/severity/associated sxs/prior treatment) HPI [name] is a 78 y.o. male who presents to the Emergency Department complaining of constant severe generalized weakness onset 1 week ago and persistent since with associated SOB, productive cough with white phlegm. Patient's daughter notes patient has been non compliant with fluid medications for the past 2 weeks. Daughter also states that patient received Iron transfusion 3 days ago for "no iron in blood'. Denies fever, nausea, vomiting, diarrhea, chest pain. Daughter states he has had some confusion.  Patient with h/o valvular heart disease, HTN, HLD, COPD, CHF, atrial fibrillation, CA, GERD, CAD, anemia and is a former smoker (quit 1 year ago).  PCP- Lodema Hong Cardiologist- [place]   Past Medical History  Diagnosis Date  . Valvular heart disease   . Obesity   . Osteoarthritis   . Hypertension   . Hyperlipidemia   . COPD (chronic obstructive pulmonary disease)   . CHF (congestive heart failure) diastolic 10/2010  . Pneumonia   . Impaired glucose tolerance   . Atrial fibrillation     Chronic  . Elevated prostate specific antigen (PSA)   . Cancer     Prostate  . Ulcer     internal-net placed and has been fixed  . Leg pain   . GERD (gastroesophageal reflux disease)   . Chronic kidney disease   . CAD (coronary artery disease)   . Hiatal hernia   . Hard of hearing   . Anemia     Past Surgical History  Procedure Date  . Left knee   . Ulcer surgery   . Bladder surgery   . Coronary artery bypass graft   . Rca with stent replacement      1994  . Left  sfa stent 12-06-10    Stenting of left superficial femoral artery and popliteal artery  . Appendectomy   . Cardiac valve replacement 2003  . Eye surgery 2007    bilateral cataracts  . Tonsillectomy     thinks they were removed while in the [employer]  . Mass excision 06/07/2011    Procedure: EXCISION MASS;  Surgeon: Fabio Bering;  Location: [facility];  Service: General;  Laterality: N/A;  excision of 2 masses back and buttocks  . Back surgery     Family History  Problem Relation Age of Onset  . Stroke Mother   . Stroke Brother   . Heart attack Brother   . Cancer Brother   . Cancer Brother   . Anesthesia problems Neg Hx   . Hypotension Neg Hx   . Malignant hyperthermia Neg Hx   . Pseudochol deficiency Neg Hx     History  Substance Use Topics  . Smoking status: Former Smoker -- 0.0 packs/day for 76 years    Types: Cigarettes    Quit date: 06/24/2010  . Smokeless tobacco: Not on file  . Alcohol Use: No  Lives at home with daughter Oxygen at 2 l/min [place]    Review of Systems 10 Systems reviewed and are negative for acute change except as noted in the HPI.  Allergies  Gabapentin  Home Medications  Current Outpatient Rx  Name Route Sig Dispense Refill  . ALBUTEROL SULFATE (2.5 MG/3ML) 0.083% IN NEBU Nebulization Take 2.5 mg by nebulization every other day. Takes twice a day every other day.    . ASPIRIN 81 MG PO TBEC Oral Take 81 mg by mouth daily.      Marland Kitchen CLOPIDOGREL BISULFATE 75 MG PO TABS Oral Take 1 tablet (75 mg total) by mouth daily. 30 tablet 5  . FLUTICASONE-SALMETEROL 100-50 MCG/DOSE IN AEPB Inhalation Inhale 1 puff into the lungs every 12 (twelve) hours.    . FUROSEMIDE 20 MG PO TABS Oral Take 20 mg by mouth daily.    Marland Kitchen HYDROCODONE-ACETAMINOPHEN 5-500 MG PO TABS Oral Take 1 tablet by mouth every 6 (six) hours as needed. Pain    . IPRATROPIUM BROMIDE 0.02 % IN SOLN Nebulization Take 500 mcg by nebulization every other day. Takes twice a day every other day.    Marland Kitchen  LOVASTATIN 40 MG PO TABS Oral Take 40 mg by mouth as directed. On Sunday, Monday, Wednesday & Friday    . METOPROLOL TARTRATE 50 MG PO TABS Oral Take 25 mg by mouth 2 (two) times daily.    . MULTIVITAMINS PO TABS Oral Take 1 tablet by mouth daily.      . NEOMYCIN-POLYMYXIN-HC 3.5-10000-1 OP SUSP Both Eyes Place 1 drop into both eyes daily as needed. Itchy eyes    . OMEPRAZOLE 40 MG PO CPDR Oral Take 40 mg by mouth daily.    Marland Kitchen POTASSIUM CHLORIDE CRYS ER 20 MEQ PO TBCR Oral Take 20 mEq by mouth daily.      BP 123/58  Pulse 90  Resp 27  Ht 6\' 2"  (1.88 m)  Wt 252 lb (114.306 kg)  BMI 32.35 kg/m2  SpO2 98%  Vital signs normal    Physical Exam  Nursing note and vitals reviewed. Constitutional: He appears well-developed and well-nourished.  HENT:  Head: Normocephalic and atraumatic.  Left Ear: External ear normal.  Nose: Nose normal.  Mouth/Throat: Oropharynx is clear and moist.       Mucous membranes dry.   Eyes: Conjunctivae and EOM are normal. Pupils are equal, round, and reactive to light.       Soft swelling of upper eyelids.   Neck: Normal range of motion. Neck supple. No tracheal deviation present.  Cardiovascular: Normal rate and regular rhythm.  Exam reveals no gallop and no friction rub.   No murmur heard. Pulmonary/Chest: Effort normal. No respiratory distress. He has no wheezes.       Diminished breath sounds diffusely.   Abdominal: Soft. He exhibits no distension.  Musculoskeletal: Normal range of motion. He exhibits no edema.       To left of thoracic mid-spine there is a linear vertical incision with reddish discoloration measured 3cm by 5cm.   Neurological: He is alert. No cranial nerve deficit or sensory deficit.  Skin: Skin is warm and dry.       Large area of multiple purple discoloration in his right upper chest that is a birthmark  Psychiatric: His behavior is normal.       Affect is flat    ED Course  Procedures (including critical care time)  DIAGNOSTIC  STUDIES: Oxygen Saturation is 100% on [place]-2L, normal by my interpretation.    Medications  albuterol (PROVENTIL) (5 MG/ML) 0.5% nebulizer solution 5 mg (5 mg Nebulization Given 08/06/11 1150)  ipratropium (ATROVENT) nebulizer solution 0.5 mg (0.5 mg Nebulization Given 08/06/11 1150)  furosemide (LASIX) injection  80 mg (80 mg Intravenous Given 08/06/11 1144)  nitroGLYCERIN (NITROGLYN) 2 % ointment 1 inch (1 inch Topical Given 08/06/11 1146)      COORDINATION OF CARE: 11:23AM- Patient informed of current plan for treatment and evaluation and agrees with plan at this time.  12:46PM- Patient and family member informed of current lab and imaging results and intent to perform echocardiogram.  15:32 Dr Marvel Plan nurse called echo report, no pericardial effusion, preserved LV function, left pleural effusion.  16:34 Dr Gwendalyn Ege admit to [facility], triad team 2  Hb was 7.6 when he got his iron transfusion  Results for orders placed during the hospital encounter of 08/06/11  CBC      Component Value Range   WBC 8.3  4.0 - 10.5 (K/uL)   RBC 3.97 (*) 4.22 - 5.81 (MIL/uL)   Hemoglobin 8.5 (*) 13.0 - 17.0 (g/dL)   HCT 11.9 (*) 14.7 - 52.0 (%)   MCV 79.3  78.0 - 100.0 (fL)   MCH 21.4 (*) 26.0 - 34.0 (pg)   MCHC 27.0 (*) 30.0 - 36.0 (g/dL)   RDW 82.9 (*) 56.2 - 15.5 (%)   Platelets 222  150 - 400 (K/uL)  DIFFERENTIAL      Component Value Range   Neutrophils Relative 72  43 - 77 (%)   Neutro Abs 5.9  1.7 - 7.7 (K/uL)   Lymphocytes Relative 10 (*) 12 - 46 (%)   Lymphs Abs 0.8  0.7 - 4.0 (K/uL)   Monocytes Relative 15 (*) 3 - 12 (%)   Monocytes Absolute 1.2 (*) 0.1 - 1.0 (K/uL)   Eosinophils Relative 3  0 - 5 (%)   Eosinophils Absolute 0.2  0.0 - 0.7 (K/uL)   Basophils Relative 1  0 - 1 (%)   Basophils Absolute 0.1  0.0 - 0.1 (K/uL)  BASIC METABOLIC PANEL      Component Value Range   Sodium 142  135 - 145 (mEq/L)   Potassium 4.1  3.5 - 5.1 (mEq/L)   Chloride 103  96 - 112 (mEq/L)   CO2 32  19 - 32  (mEq/L)   Glucose, Bld 108 (*) 70 - 99 (mg/dL)   BUN 17  6 - 23 (mg/dL)   Creatinine, Ser 1.30  0.50 - 1.35 (mg/dL)   Calcium 9.1  8.4 - 86.5 (mg/dL)   GFR calc non Af Amer 65 (*) >90 (mL/min)   GFR calc Af Amer 76 (*) >90 (mL/min)  TROPONIN I      Component Value Range   Troponin I <0.30  <0.30 (ng/mL)  PRO B NATRIURETIC PEPTIDE      Component Value Range   Pro B Natriuretic peptide (BNP) 2781.0 (*) 0 - 450 (pg/mL)  URINALYSIS, ROUTINE W REFLEX MICROSCOPIC      Component Value Range   Color, Urine YELLOW  YELLOW    APPearance CLEAR  CLEAR    Specific Gravity, Urine 1.025  1.005 - 1.030    pH 6.0  5.0 - 8.0    Glucose, UA NEGATIVE  NEGATIVE (mg/dL)   Hgb urine dipstick NEGATIVE  NEGATIVE    Bilirubin Urine NEGATIVE  NEGATIVE    Ketones, ur NEGATIVE  NEGATIVE (mg/dL)   Protein, ur TRACE (*) NEGATIVE (mg/dL)   Urobilinogen, UA 0.2  0.0 - 1.0 (mg/dL)   Nitrite NEGATIVE  NEGATIVE    Leukocytes, UA NEGATIVE  NEGATIVE   URINE MICROSCOPIC-ADD ON      Component Value Range   Squamous Epithelial / LPF  RARE  RARE    WBC, UA 0-2  <3 (WBC/hpf)   RBC / HPF 0-2  <3 (RBC/hpf)   Bacteria, UA RARE  RARE    Casts HYALINE CASTS (*) NEGATIVE    Laboratory interpretation all normal except elevated BNP and anemia   Dg Chest Portable 1 View  08/06/2011  *RADIOLOGY REPORT*  Clinical Data: Weakness and congestion.  History of COPD and CHF. Former smoker.  PORTABLE CHEST - 1 VIEW  Comparison: Chest x-ray 01/18/2011.  Findings: Moderate left-sided pleural effusion.  Complete opacification of the base of the left hemithorax likely reflects the presence of pleural fluid, cardiomegaly and underlying atelectasis (airspace consolidation in the left lower lobe would be difficult to exclude, however).  Right lung appears relatively clear.  No definite right-sided pleural effusion.  Pulmonary vasculature is normal.  A moderate - severe enlargement of the cardiopericardial silhouette is noted. The patient is  rotated to the right on today's exam, resulting in distortion of the mediastinal contours and reduced diagnostic sensitivity and specificity for mediastinal pathology.  Status post median sternotomy.  Atherosclerosis in the thoracic aorta.  IMPRESSION:  1.  Enlarging left-sided pleural effusion which is now moderate in size. 2.  Probable atelectasis in the left lower lobe (airspace consolidation is difficult to exclude). 3.  Moderate - severe enlargement of the cardiopericardial silhouette.  This may reflect cardiomegaly and/or the presence of a pericardial effusion.  Clinical correlation is recommended. 4.  Atherosclerosis. 5.  Status post median sternotomy.  Original Report Authenticated By: Florencia Reasons, M.D.     Date: 08/06/2011  Rate: 88  Rhythm: atrial fibrillation and premature ventricular contractions (PVC)  QRS Axis: left  Intervals: normal  ST/T Wave abnormalities: normal  Conduction Disutrbances:none  Narrative Interpretation: PRWP  Old EKG Reviewed: unchanged from 01/18/2011  Diagnoses that have been ruled out:  None  Diagnoses that are still under consideration:  None  Final diagnoses:  Shortness of breath  Pleural effusion, left  Confusion  Anemia   Plan admission  Devoria Albe, MD, FACEP     MDM    I personally performed the services described in this documentation, which was scribed in my presence. The recorded information has been reviewed and considered. Devoria Albe, MD, Armando Gang    Ward Givens, MD 08/06/11 [phone]

## 2011-08-06 NOTE — ED Notes (Signed)
Echo at bedside.  Done and completed and Cardiologist Rothbart notified of Echo needing to be read.

## 2011-08-06 NOTE — H&P (Signed)
PCP:   [name], MD, MD   Chief Complaint:  Shortness of breath, edema  HPI: This is a 78 year old gentleman with history of chronic diastolic congestive heart failure who lives at home with his daughter. His daughter reports that she has noticed that the patient was becoming increasingly short of breath and edematous for the past week. She noticed swelling around his face, his legs, and he repeatedly complained of shortness of breath. Patient is chronically on home oxygen, has a history of COPD. She also reports that he is on home Lasix. The patient has not been taking his Lasix for approximately 2 weeks now. His daughter reports that he has not been taking it since it makes him use the bathroom frequently and has difficult time getting to the bathroom in time. There has not been any chest pain. No fever. He does have a cough, which is chronic. No other complaints.  Patient was evaluated in the emergency room and was found to be in fluid overload. He was given a dose of Lasix and his daughter reports significant improvement with this. He has been referred for admission. 2-D echocardiogram done in the emergency room showed the patient did not have any pericardial effusion, and an intact the EF. This is per report from the emergency room physician.  Allergies:   Allergies  Allergen Reactions  . Gabapentin     Patient cannot take over 200 mg dose at a time as it causes jerking or spasms      Past Medical History  Diagnosis Date  . Valvular heart disease   . Obesity   . Osteoarthritis   . Hypertension   . Hyperlipidemia   . COPD (chronic obstructive pulmonary disease)   . CHF (congestive heart failure) diastolic 10/2010  . Pneumonia   . Impaired glucose tolerance   . Atrial fibrillation     Chronic  . Elevated prostate specific antigen (PSA)   . Cancer     Prostate  . Ulcer     internal-net placed and has been fixed  . Leg pain   . GERD (gastroesophageal reflux disease)     . Chronic kidney disease   . CAD (coronary artery disease)   . Hiatal hernia   . Hard of hearing   . Anemia     Past Surgical History  Procedure Date  . Left knee   . Ulcer surgery   . Bladder surgery   . Coronary artery bypass graft   . Rca with stent replacement      1994  . Left sfa stent 12-06-10    Stenting of left superficial femoral artery and popliteal artery  . Appendectomy   . Cardiac valve replacement 2003  . Eye surgery 2007    bilateral cataracts  . Tonsillectomy     thinks they were removed while in the [employer]  . Mass excision 06/07/2011    Procedure: EXCISION MASS;  Surgeon: Fabio Bering;  Location: [facility];  Service: General;  Laterality: N/A;  excision of 2 masses back and buttocks  . Back surgery     Prior to Admission medications   Medication Sig Start Date End Date Taking? Authorizing Provider  albuterol (PROVENTIL) (2.5 MG/3ML) 0.083% nebulizer solution Take 2.5 mg by nebulization every other day. Takes twice a day every other day.   Yes Historical Provider, MD  aspirin 81 MG EC tablet Take 81 mg by mouth daily.     Yes Historical Provider, MD  clopidogrel (PLAVIX) 75  MG tablet Take 1 tablet (75 mg total) by mouth daily. 06/07/11  Yes V Durene Cal, MD  Fluticasone-Salmeterol (ADVAIR) 100-50 MCG/DOSE AEPB Inhale 1 puff into the lungs every 12 (twelve) hours.   Yes Historical Provider, MD  furosemide (LASIX) 20 MG tablet Take 20 mg by mouth daily.   Yes Historical Provider, MD  HYDROcodone-acetaminophen (VICODIN) 5-500 MG per tablet Take 1 tablet by mouth every 6 (six) hours as needed. Pain   Yes Historical Provider, MD  ipratropium (ATROVENT) 0.02 % nebulizer solution Take 500 mcg by nebulization every other day. Takes twice a day every other day.   Yes Historical Provider, MD  lovastatin (MEVACOR) 40 MG tablet Take 40 mg by mouth as directed. On Sunday, Monday, Wednesday & Friday 03/07/11  Yes [name], MD  metoprolol (LOPRESSOR) 50 MG tablet  Take 25 mg by mouth 2 (two) times daily. 03/07/11  Yes [name], MD  multivitamin [facility]) per tablet Take 1 tablet by mouth daily.     Yes Historical Provider, MD  neomycin-polymyxin-hydrocortisone (CORTISPORIN) 3.5-10000-1 ophthalmic suspension Place 1 drop into both eyes daily as needed. Itchy eyes   Yes Historical Provider, MD  omeprazole (PRILOSEC) 40 MG capsule Take 40 mg by mouth daily.   Yes Historical Provider, MD  potassium chloride SA (K-DUR,KLOR-CON) 20 MEQ tablet Take 20 mEq by mouth daily.   Yes Historical Provider, MD    Social History:  reports that he quit smoking about 13 months ago. His smoking use included Cigarettes. He smoked 0 packs per day for 78 years. He does not have any smokeless tobacco history on file. He reports that he does not drink alcohol or use illicit drugs.  Family History  Problem Relation Age of Onset  . Stroke Mother   . Stroke Brother   . Heart attack Brother   . Cancer Brother   . Cancer Brother   . Anesthesia problems Neg Hx   . Hypotension Neg Hx   . Malignant hyperthermia Neg Hx   . Pseudochol deficiency Neg Hx     Review of Systems: Positives in bold Constitutional: Denies fever, chills, diaphoresis, appetite change and fatigue.  HEENT: Denies photophobia, eye pain, redness, hearing loss, ear pain, congestion, sore throat, rhinorrhea, sneezing, mouth sores, trouble swallowing, neck pain, neck stiffness and tinnitus.   Respiratory: Denies SOB, DOE, cough, chest tightness,  and wheezing.   Cardiovascular: Denies chest pain, palpitations and leg swelling.  Gastrointestinal: Denies nausea, vomiting, abdominal pain, diarrhea, constipation, blood in stool and abdominal distention.  Genitourinary: Denies dysuria, urgency, frequency, hematuria, flank pain and difficulty urinating.  Musculoskeletal: Denies myalgias, back pain, joint swelling, arthralgias and gait problem.  Skin: Denies pallor, rash and wound.  Neurological: Denies  dizziness, seizures, syncope, weakness, light-headedness, numbness and headaches.  Hematological: Denies adenopathy. Easy bruising, personal or family bleeding history  Psychiatric/Behavioral: Denies suicidal ideation, mood changes, confusion, nervousness, sleep disturbance and agitation   Physical Exam: Blood pressure 103/87, pulse 90, resp. rate 18, height 6\' 2"  (1.88 m), weight 114.306 kg (252 lb), SpO2 95.00%. General: Patient is in no acute distress, lying in bed HEENT: Normocephalic, atraumatic, pupils are equal round react to light Neck: Supple Chest: Decreased breath sounds at the left base Cardiac: S1, S2, regular rate and rhythm, and abdomen: Soft, nontender, nondistended, positive active Extremities: Trace edema, no cyanosis or clubbing Neurologic: Grossly intact, nonfocal Skin: Intact, no visible rashes.  Labs on Admission:  Results for orders placed during the hospital encounter of 08/06/11 (from the  past 48 hour(s))  CBC     Status: Abnormal   Collection Time   08/06/11 10:29 AM      Component Value Range Comment   WBC 8.3  4.0 - 10.5 (K/uL)    RBC 3.97 (*) 4.22 - 5.81 (MIL/uL)    Hemoglobin 8.5 (*) 13.0 - 17.0 (g/dL)    HCT 62.1 (*) 30.8 - 52.0 (%)    MCV 79.3  78.0 - 100.0 (fL)    MCH 21.4 (*) 26.0 - 34.0 (pg)    MCHC 27.0 (*) 30.0 - 36.0 (g/dL)    RDW 65.7 (*) 84.6 - 15.5 (%)    Platelets 222  150 - 400 (K/uL)   DIFFERENTIAL     Status: Abnormal   Collection Time   08/06/11 10:29 AM      Component Value Range Comment   Neutrophils Relative 72  43 - 77 (%)    Neutro Abs 5.9  1.7 - 7.7 (K/uL)    Lymphocytes Relative 10 (*) 12 - 46 (%)    Lymphs Abs 0.8  0.7 - 4.0 (K/uL)    Monocytes Relative 15 (*) 3 - 12 (%)    Monocytes Absolute 1.2 (*) 0.1 - 1.0 (K/uL)    Eosinophils Relative 3  0 - 5 (%)    Eosinophils Absolute 0.2  0.0 - 0.7 (K/uL)    Basophils Relative 1  0 - 1 (%)    Basophils Absolute 0.1  0.0 - 0.1 (K/uL)   BASIC METABOLIC PANEL     Status: Abnormal    Collection Time   08/06/11 10:29 AM      Component Value Range Comment   Sodium 142  135 - 145 (mEq/L)    Potassium 4.1  3.5 - 5.1 (mEq/L)    Chloride 103  96 - 112 (mEq/L)    CO2 32  19 - 32 (mEq/L)    Glucose, Bld 108 (*) 70 - 99 (mg/dL)    BUN 17  6 - 23 (mg/dL)    Creatinine, Ser 9.62  0.50 - 1.35 (mg/dL)    Calcium 9.1  8.4 - 10.5 (mg/dL)    GFR calc non Af Amer 65 (*) >90 (mL/min)    GFR calc Af Amer 76 (*) >90 (mL/min)   TROPONIN I     Status: Normal   Collection Time   08/06/11 10:30 AM      Component Value Range Comment   Troponin I <0.30  <0.30 (ng/mL)   PRO B NATRIURETIC PEPTIDE     Status: Abnormal   Collection Time   08/06/11 10:30 AM      Component Value Range Comment   Pro B Natriuretic peptide (BNP) 2781.0 (*) 0 - 450 (pg/mL)   URINALYSIS, ROUTINE W REFLEX MICROSCOPIC     Status: Abnormal   Collection Time   08/06/11 12:05 PM      Component Value Range Comment   Color, Urine YELLOW  YELLOW     APPearance CLEAR  CLEAR     Specific Gravity, Urine 1.025  1.005 - 1.030     pH 6.0  5.0 - 8.0     Glucose, UA NEGATIVE  NEGATIVE (mg/dL)    Hgb urine dipstick NEGATIVE  NEGATIVE     Bilirubin Urine NEGATIVE  NEGATIVE     Ketones, ur NEGATIVE  NEGATIVE (mg/dL)    Protein, ur TRACE (*) NEGATIVE (mg/dL)    Urobilinogen, UA 0.2  0.0 - 1.0 (mg/dL)    Nitrite NEGATIVE  NEGATIVE  Leukocytes, UA NEGATIVE  NEGATIVE    URINE MICROSCOPIC-ADD ON     Status: Abnormal   Collection Time   08/06/11 12:05 PM      Component Value Range Comment   Squamous Epithelial / LPF RARE  RARE     WBC, UA 0-2  <3 (WBC/hpf)    RBC / HPF 0-2  <3 (RBC/hpf)    Bacteria, UA RARE  RARE     Casts HYALINE CASTS (*) NEGATIVE      Radiological Exams on Admission: Dg Chest Portable 1 View  08/06/2011  *RADIOLOGY REPORT*  Clinical Data: Weakness and congestion.  History of COPD and CHF. Former smoker.  PORTABLE CHEST - 1 VIEW  Comparison: Chest x-ray 01/18/2011.  Findings: Moderate left-sided  pleural effusion.  Complete opacification of the base of the left hemithorax likely reflects the presence of pleural fluid, cardiomegaly and underlying atelectasis (airspace consolidation in the left lower lobe would be difficult to exclude, however).  Right lung appears relatively clear.  No definite right-sided pleural effusion.  Pulmonary vasculature is normal.  A moderate - severe enlargement of the cardiopericardial silhouette is noted. The patient is rotated to the right on today's exam, resulting in distortion of the mediastinal contours and reduced diagnostic sensitivity and specificity for mediastinal pathology.  Status post median sternotomy.  Atherosclerosis in the thoracic aorta.  IMPRESSION:  1.  Enlarging left-sided pleural effusion which is now moderate in size. 2.  Probable atelectasis in the left lower lobe (airspace consolidation is difficult to exclude). 3.  Moderate - severe enlargement of the cardiopericardial silhouette.  This may reflect cardiomegaly and/or the presence of a pericardial effusion.  Clinical correlation is recommended. 4.  Atherosclerosis. 5.  Status post median sternotomy.  Original Report Authenticated By: Florencia Reasons, M.D.    Assessment/Plan Principal Problem:  *Diastolic CHF, acute on chronic, secondary to noncompliance. Patient has 2-D echocardiogram done, we'll follow official results. Will continue him on IV Lasix until his volume status is improved. We'll continue the remainder of his outpatient medications. Strict I.'s and Os, daily weights.  Active Problems:   IRON DEFIC ANEMIA SEC DIET IRON INTAKE, patient is on IV iron infusion therapy. His hemoglobin has improved from prior levels.   COPD, patient is on home oxygen. We will continue his nebulizer treatments. He does not appear to have any acute exacerbation of time.   Respiratory failure, acute and chronic, due to #1, continue supportive oxygen.   Peripheral vascular disease, recently had  stents placed, continue Plavix   Pleural effusion due to CHF (congestive heart failure), we'll start IV diuresis, and recheck x-ray. Would avoid thoracentesis in this elderly gentleman.  Patient is a full code, per the orders per the clinical course   Time Spent on Admission:  Kamaya Keckler [facility] Pager: [phone] 08/06/2011, 5:44 PM

## 2011-08-06 NOTE — Progress Notes (Signed)
*  PRELIMINARY RESULTS* Echocardiogram 2D Echocardiogram has been performed.  Conrad  08/06/2011, 1:47 PM

## 2011-08-06 NOTE — Telephone Encounter (Signed)
[name] is getting him ready to go to ER now

## 2011-08-06 NOTE — ED Notes (Signed)
MD at bedside. 

## 2011-08-06 NOTE — ED Notes (Signed)
Pt daughter reports pt has been weak with poor appetite and not feeling well x 1 week.  Pt also c/o sob/shaking.

## 2011-08-07 ENCOUNTER — Telehealth: Payer: Self-pay

## 2011-08-07 LAB — BASIC METABOLIC PANEL
Calcium: 8.8 mg/dL (ref 8.4–10.5)
GFR calc Af Amer: 84 mL/min — ABNORMAL LOW (ref >90)
GFR calc non Af Amer: 72 mL/min — ABNORMAL LOW (ref >90)
Potassium: 3.5 mEq/L (ref 3.5–5.1)
Sodium: 140 mEq/L (ref 135–145)

## 2011-08-07 LAB — URINALYSIS, ROUTINE W REFLEX MICROSCOPIC
Bilirubin Urine: NEGATIVE
Nitrite: POSITIVE — AB
Specific Gravity, Urine: 1.015 (ref 1.005–1.030)
Urobilinogen, UA: 0.2 mg/dL (ref 0.0–1.0)

## 2011-08-07 LAB — URINE CULTURE
Colony Count: NO GROWTH
Culture  Setup Time: 201302130110

## 2011-08-07 LAB — CBC
Hemoglobin: 8 g/dL — ABNORMAL LOW (ref 13.0–17.0)
MCH: 21.7 pg — ABNORMAL LOW (ref 26.0–34.0)
Platelets: 211 10*3/uL (ref 150–400)
RBC: 3.68 MIL/uL — ABNORMAL LOW (ref 4.22–5.81)
WBC: 8.2 10*3/uL (ref 4.0–10.5)

## 2011-08-07 LAB — URINE MICROSCOPIC-ADD ON

## 2011-08-07 MED ORDER — SODIUM CHLORIDE 0.9 % IJ SOLN
INTRAMUSCULAR | Status: AC
  Administered 2011-08-07: 3 mL via INTRAVENOUS
  Filled 2011-08-07: qty 3

## 2011-08-07 MED ORDER — SODIUM CHLORIDE 0.9 % IJ SOLN
INTRAMUSCULAR | Status: AC
  Administered 2011-08-07: 11:00:00
  Filled 2011-08-07: qty 3

## 2011-08-07 MED ORDER — CIPROFLOXACIN HCL 250 MG PO TABS
500.0000 mg | ORAL_TABLET | Freq: Two times a day (BID) | ORAL | Status: DC
  Administered 2011-08-07 – 2011-08-09 (×4): 500 mg via ORAL
  Filled 2011-08-07 (×4): qty 2

## 2011-08-07 NOTE — Progress Notes (Signed)
Pts speech seems slightly garbled/slurred since I met him at approximately 2040, I am unsure if this is his baseline or not. No pronator drift, his grips are strong, and his pupils react briskly to light. I will continue to monitor and notify MD if there are any changes. Sheryn Bison

## 2011-08-07 NOTE — Progress Notes (Signed)
Continues on IV Lasix. Foley draining yellow urine.

## 2011-08-07 NOTE — Plan of Care (Signed)
Problem: Phase I Progression Outcomes Goal: Pain controlled with appropriate interventions Outcome: Progressing Pt is not complaining of any pain at this time.

## 2011-08-07 NOTE — Progress Notes (Signed)
CARE MANAGEMENT NOTE 08/07/2011  Patient:  [name], [name]   Account Number:  [id number]  Date Initiated:  08/07/2011  Documentation initiated by:  Rosemary Holms  Subjective/Objective Assessment:   Pt admitted with difficulty breathing, CHF, COPD. PTA pt lived at home with daughter. Per Daughter, pt stopped taking his Lasix two weeks ago because he could not make it to the Bathroom from his recliiner.     Action/Plan:   Spoke with daughter [name] at length about pt's history leading up to admission. She states that she would like HH PT through [place] and DME B[facility] from [place].   Anticipated DC Date:  08/09/2011   Anticipated DC Plan:  HOME W HOME HEALTH SERVICES  In-house referral  Clinical Social Worker      DC Planning Services  CM consult      Choice offered to / List presented to:             Status of service:  In process, will continue to follow Medicare Important Message given?   (If response is "NO", the following Medicare IM given date fields will be blank) Date Medicare IM given:   Date Additional Medicare IM given:    Discharge Disposition:    Per UR Regulation:    Comments:  08/07/11 1400 Leonard Westry RN BSN CM Pt's RN states that daughter has changed her mind and would like to get pt evaluated for placement at [facility]. CSW and MD notified.  08/07/11 1100 Leonard Polio [name] RN BSN CM

## 2011-08-07 NOTE — Progress Notes (Signed)
Subjective: Breathing is about the same, no new complaints, no chest pain  Objective: Vital signs in last 24 hours: Temp:  [97.6 F (36.4 C)-98.1 F (36.7 C)] 97.9 F (36.6 C) (02/13 1409) Pulse Rate:  [73-91] 73  (02/13 1409) Resp:  [18-20] 20  (02/13 1409) BP: (102-130)/(44-94) 102/59 mmHg (02/13 1409) SpO2:  [88 %-98 %] 88 % (02/13 1409) Weight:  [110.6 kg (243 lb 13.3 oz)-112.4 kg (247 lb 12.8 oz)] 110.6 kg (243 lb 13.3 oz) (02/13 0558) Weight change:  Last BM Date: 08/06/11  Intake/Output from previous day: 02/12 0701 - 02/13 0700 In: 3 [I.V.:3] Out: 5400 [Urine:5400] Total I/O In: -  Out: 750 [Urine:750]   Physical Exam: General: Alert, awake, oriented x3, in no acute distress. HEENT: No bruits, no goiter. Heart: Regular rate and rhythm, without murmurs, rubs, gallops. Lungs: decreased breath sounds at the left base. Abdomen: Soft, nontender, nondistended, positive bowel sounds. Extremities: trace edema b/l Neuro: Grossly intact, nonfocal.    Lab Results: Basic Metabolic Panel:  Basename 08/07/11 0525 08/06/11 1029  NA 140 142  K 3.5 4.1  CL 98 103  CO2 36* 32  GLUCOSE 108* 108*  BUN 15 17  CREATININE 0.97 1.00  CALCIUM 8.8 9.1  MG -- --  PHOS -- --   Liver Function Tests: No results found for this basename: AST:2,ALT:2,ALKPHOS:2,BILITOT:2,PROT:2,ALBUMIN:2 in the last 72 hours No results found for this basename: LIPASE:2,AMYLASE:2 in the last 72 hours No results found for this basename: AMMONIA:2 in the last 72 hours CBC:  Basename 08/07/11 0525 08/06/11 1029  WBC 8.2 8.3  NEUTROABS -- 5.9  HGB 8.0* 8.5*  HCT 29.1* 31.5*  MCV 79.1 79.3  PLT 211 222   Cardiac Enzymes:  Basename 08/06/11 1030  CKTOTAL --  CKMB --  CKMBINDEX --  TROPONINI <0.30   BNP:  Basename 08/06/11 1030  PROBNP 2781.0*   D-Dimer: No results found for this basename: DDIMER:2 in the last 72 hours CBG: No results found for this basename: GLUCAP:6 in the last 72  hours Hemoglobin A1C: No results found for this basename: HGBA1C in the last 72 hours Fasting Lipid Panel: No results found for this basename: CHOL,HDL,LDLCALC,TRIG,CHOLHDL,LDLDIRECT in the last 72 hours Thyroid Function Tests: No results found for this basename: TSH,T4TOTAL,FREET4,T3FREE,THYROIDAB in the last 72 hours Anemia Panel: No results found for this basename: VITAMINB12,FOLATE,FERRITIN,TIBC,IRON,RETICCTPCT in the last 72 hours Coagulation: No results found for this basename: LABPROT:2,INR:2 in the last 72 hours Urine Drug Screen: Drugs of Abuse  No results found for this basename: labopia, cocainscrnur, labbenz, amphetmu, thcu, labbarb    Alcohol Level: No results found for this basename: ETH:2 in the last 72 hours Urinalysis:  Basename 08/07/11 1123 08/06/11 1205  COLORURINE YELLOW YELLOW  LABSPEC 1.015 1.025  PHURINE 5.0 6.0  GLUCOSEU NEGATIVE NEGATIVE  HGBUR LARGE* NEGATIVE  BILIRUBINUR NEGATIVE NEGATIVE  KETONESUR NEGATIVE NEGATIVE  PROTEINUR NEGATIVE TRACE*  UROBILINOGEN 0.2 0.2  NITRITE POSITIVE* NEGATIVE  LEUKOCYTESUR SMALL* NEGATIVE    No results found for this or any previous visit (from the past 240 hour(s)).  Studies/Results: Dg Chest Portable 1 View  08/06/2011  *RADIOLOGY REPORT*  Clinical Data: Weakness and congestion.  History of COPD and CHF. Former smoker.  PORTABLE CHEST - 1 VIEW  Comparison: Chest x-ray 01/18/2011.  Findings: Moderate left-sided pleural effusion.  Complete opacification of the base of the left hemithorax likely reflects the presence of pleural fluid, cardiomegaly and underlying atelectasis (airspace consolidation in the left lower lobe would be  difficult to exclude, however).  Right lung appears relatively clear.  No definite right-sided pleural effusion.  Pulmonary vasculature is normal.  A moderate - severe enlargement of the cardiopericardial silhouette is noted. The patient is rotated to the right on today's exam, resulting in  distortion of the mediastinal contours and reduced diagnostic sensitivity and specificity for mediastinal pathology.  Status post median sternotomy.  Atherosclerosis in the thoracic aorta.  IMPRESSION:  1.  Enlarging left-sided pleural effusion which is now moderate in size. 2.  Probable atelectasis in the left lower lobe (airspace consolidation is difficult to exclude). 3.  Moderate - severe enlargement of the cardiopericardial silhouette.  This may reflect cardiomegaly and/or the presence of a pericardial effusion.  Clinical correlation is recommended. 4.  Atherosclerosis. 5.  Status post median sternotomy.  Original Report Authenticated By: Florencia Reasons, M.D.    Medications: Scheduled Meds:   . aspirin EC  81 mg Oral Daily  . clopidogrel  75 mg Oral Daily  . enoxaparin  30 mg Subcutaneous Q24H  . Fluticasone-Salmeterol  1 puff Inhalation Q12H  . furosemide  40 mg Intravenous BID  . metoprolol  25 mg Oral BID  . mulitivitamin with minerals  1 tablet Oral Daily  . pantoprazole  40 mg Oral Q1200  . potassium chloride SA  20 mEq Oral Daily  . simvastatin  20 mg Oral q1800  . sodium chloride  3 mL Intravenous Q12H  . sodium chloride       Continuous Infusions:  PRN Meds:.sodium chloride, acetaminophen, albuterol, HYDROcodone-acetaminophen, ipratropium, ondansetron (ZOFRAN) IV, sodium chloride  Assessment/Plan:  Principal Problem:  *Diastolic CHF, acute on chronic, patients reported fluid balance is negative 6L since admission, his weight has decreased by 2kg. He does appear to be responding to diuresis, cont the same and wean down oxygen as tolerated.  Follow up official echo results Active Problems:  IRON DEFIC ANEMIA SEC DIET IRON INTAKE, on outpatient iron infusion therapy, hgb stable  COPD, on chronic oxygen, no signs of wheezing   Pleural effusion due to CHF (congestive heart failure), will repeat xray in the morning UTI, positive urinalysis, start oral cipro, send  culture Patient's family is interested in SNF placement, will ask PT to see    LOS: 1 day   [name] [facility] Pager: [phone] 08/07/2011, 3:08 PM

## 2011-08-07 NOTE — Telephone Encounter (Signed)
[name] is at the hospital and he is going to be there a few more days. [name] was telling them how the o2 tubing irritates his face and he takes it off at night. They want him to have a sleep study done while he is already there at the hospital. Dr Kerry Hough is his Dr while he is there. Said to please order so he doesn't have to come back for it.

## 2011-08-07 NOTE — Telephone Encounter (Signed)
Explain that the doc in the hospital is the one who can place orders on the patient while he is still in the hospital, sje needs to spk with that Doc, alsoI saw where her Dad was admitted and hope he improves over time

## 2011-08-08 ENCOUNTER — Inpatient Hospital Stay (HOSPITAL_COMMUNITY): Payer: Medicare Other

## 2011-08-08 LAB — CBC
HCT: 31.2 % — ABNORMAL LOW (ref 39.0–52.0)
Hemoglobin: 8.4 g/dL — ABNORMAL LOW (ref 13.0–17.0)
MCH: 21.5 pg — ABNORMAL LOW (ref 26.0–34.0)
MCHC: 26.9 g/dL — ABNORMAL LOW (ref 30.0–36.0)
MCV: 79.8 fL (ref 78.0–100.0)

## 2011-08-08 LAB — BASIC METABOLIC PANEL
BUN: 17 mg/dL (ref 6–23)
CO2: 39 mEq/L — ABNORMAL HIGH (ref 19–32)
Chloride: 94 mEq/L — ABNORMAL LOW (ref 96–112)
GFR calc non Af Amer: 53 mL/min — ABNORMAL LOW (ref >90)
Glucose, Bld: 118 mg/dL — ABNORMAL HIGH (ref 70–99)
Potassium: 3.9 mEq/L (ref 3.5–5.1)

## 2011-08-08 MED ORDER — FUROSEMIDE 40 MG PO TABS
40.0000 mg | ORAL_TABLET | Freq: Every day | ORAL | Status: DC
  Administered 2011-08-09: 40 mg via ORAL
  Filled 2011-08-08 (×2): qty 1

## 2011-08-08 MED ORDER — SODIUM CHLORIDE 0.9 % IJ SOLN
INTRAMUSCULAR | Status: AC
  Administered 2011-08-08: 3 mL via INTRAVENOUS
  Filled 2011-08-08: qty 3

## 2011-08-08 MED ORDER — ENOXAPARIN SODIUM 40 MG/0.4ML ~~LOC~~ SOLN
40.0000 mg | Freq: Every day | SUBCUTANEOUS | Status: DC
  Administered 2011-08-08 – 2011-08-09 (×2): 40 mg via SUBCUTANEOUS
  Filled 2011-08-08 (×2): qty 0.4

## 2011-08-08 NOTE — Progress Notes (Signed)
Subjective: Breathing improving, no chest pain, no new complaints  Objective: Vital signs in last 24 hours: Temp:  [97.3 F (36.3 C)-97.9 F (36.6 C)] 97.9 F (36.6 C) (02/14 1401) Pulse Rate:  [77-86] 77  (02/14 1401) Resp:  [20] 20  (02/14 1401) BP: (108-119)/(63-76) 119/70 mmHg (02/14 1401) SpO2:  [90 %-92 %] 91 % (02/14 1401) Weight:  [109.6 kg (241 lb 10 oz)] 109.6 kg (241 lb 10 oz) (02/14 0500) Weight change: -4.706 kg (-10 lb 6 oz) Last BM Date: 08/07/11  Intake/Output from previous day: 02/13 0701 - 02/14 0700 In: 1392 [P.O.:1380; IV Piggyback:12] Out: 2100 [Urine:2100] Total I/O In: 520 [P.O.:520] Out: -    Physical Exam: General: Alert, awake, in no acute distress. HEENT: No bruits, no goiter. Heart: Regular rate and rhythm, without murmurs, rubs, gallops. Lungs: diminshed breath sounds b/l Abdomen: Soft, nontender, nondistended, positive bowel sounds. Extremities: No clubbing cyanosis or edema with positive pedal pulses. Neuro: Grossly intact, nonfocal.    Lab Results: Basic Metabolic Panel:  Basename 08/08/11 0522 08/07/11 0525  NA 139 140  K 3.9 3.5  CL 94* 98  CO2 39* 36*  GLUCOSE 118* 108*  BUN 17 15  CREATININE 1.20 0.97  CALCIUM 8.9 8.8  MG -- --  PHOS -- --   Liver Function Tests: No results found for this basename: AST:2,ALT:2,ALKPHOS:2,BILITOT:2,PROT:2,ALBUMIN:2 in the last 72 hours No results found for this basename: LIPASE:2,AMYLASE:2 in the last 72 hours No results found for this basename: AMMONIA:2 in the last 72 hours CBC:  Basename 08/08/11 0522 08/07/11 0525 08/06/11 1029  WBC 10.2 8.2 --  NEUTROABS -- -- 5.9  HGB 8.4* 8.0* --  HCT 31.2* 29.1* --  MCV 79.8 79.1 --  PLT 205 211 --   Cardiac Enzymes:  Basename 08/06/11 1030  CKTOTAL --  CKMB --  CKMBINDEX --  TROPONINI <0.30   BNP:  Basename 08/06/11 1030  PROBNP 2781.0*   D-Dimer: No results found for this basename: DDIMER:2 in the last 72 hours CBG: No  results found for this basename: GLUCAP:6 in the last 72 hours Hemoglobin A1C: No results found for this basename: HGBA1C in the last 72 hours Fasting Lipid Panel: No results found for this basename: CHOL,HDL,LDLCALC,TRIG,CHOLHDL,LDLDIRECT in the last 72 hours Thyroid Function Tests: No results found for this basename: TSH,T4TOTAL,FREET4,T3FREE,THYROIDAB in the last 72 hours Anemia Panel: No results found for this basename: VITAMINB12,FOLATE,FERRITIN,TIBC,IRON,RETICCTPCT in the last 72 hours Coagulation: No results found for this basename: LABPROT:2,INR:2 in the last 72 hours Urine Drug Screen: Drugs of Abuse  No results found for this basename: labopia, cocainscrnur, labbenz, amphetmu, thcu, labbarb    Alcohol Level: No results found for this basename: ETH:2 in the last 72 hours Urinalysis:  Basename 08/07/11 1123 08/06/11 1205  COLORURINE YELLOW YELLOW  LABSPEC 1.015 1.025  PHURINE 5.0 6.0  GLUCOSEU NEGATIVE NEGATIVE  HGBUR LARGE* NEGATIVE  BILIRUBINUR NEGATIVE NEGATIVE  KETONESUR NEGATIVE NEGATIVE  PROTEINUR NEGATIVE TRACE*  UROBILINOGEN 0.2 0.2  NITRITE POSITIVE* NEGATIVE  LEUKOCYTESUR SMALL* NEGATIVE    Recent Results (from the past 240 hour(s))  URINE CULTURE     Status: Normal   Collection Time   08/06/11 12:05 PM      Component Value Range Status Comment   Specimen Description URINE, CATHETERIZED   Final    Special Requests none   Final    Culture  Setup Time [id number]   Final    Colony Count NO GROWTH   Final    Culture NO  GROWTH   Final    Report Status 08/07/2011 FINAL   Final     Studies/Results: Dg Chest 1 View  08/08/2011  *RADIOLOGY REPORT*  Clinical Data: Pleural effusion, shortness of breath.  CHEST - 1 VIEW  Comparison: Chest x-ray 08/06/2011.  Findings: Overall, there is improving aeration in the left hemithorax, likely related to decreasing left pleural effusion (now small), and improving left basilar atelectasis and/or consolidation.  Predominately linear opacities at the right base are favored to represent atelectasis.  No definite right-sided pleural effusion.  Pulmonary vascular crowding, without frank pulmonary edema.  Heart size is moderately enlarged (unchanged). Atherosclerosis in the thoracic aorta.  Status post median sternotomy. The patient is rotated to the right on today's exam, resulting in distortion of the mediastinal contours and reduced diagnostic sensitivity and specificity for mediastinal pathology.  IMPRESSION:  1.  Compared to yesterday's examination, there is improving aeration particularly in the left mid and lower lung, likely related to resolving left effusion and decreasing left lower lobe atelectasis and/or consolidation.  Original Report Authenticated By: Florencia Reasons, M.D.    Medications: Scheduled Meds:   . aspirin EC  81 mg Oral Daily  . ciprofloxacin  500 mg Oral BID  . clopidogrel  75 mg Oral Daily  . enoxaparin (LOVENOX) injection  40 mg Subcutaneous Daily  . Fluticasone-Salmeterol  1 puff Inhalation Q12H  . furosemide  40 mg Oral Daily  . metoprolol  25 mg Oral BID  . mulitivitamin with minerals  1 tablet Oral Daily  . pantoprazole  40 mg Oral Q1200  . potassium chloride SA  20 mEq Oral Daily  . simvastatin  20 mg Oral q1800  . sodium chloride  3 mL Intravenous Q12H  . DISCONTD: enoxaparin  30 mg Subcutaneous Q24H  . DISCONTD: furosemide  40 mg Intravenous BID   Continuous Infusions:  PRN Meds:.sodium chloride, acetaminophen, albuterol, HYDROcodone-acetaminophen, ipratropium, ondansetron (ZOFRAN) IV, sodium chloride  Assessment/Plan:  Principal Problem:  *Diastolic CHF, acute on chronic, Patient continues to have good response to diuresis.  His weight is down 5kg since admission. His chest xray shows decrease in pleural effusion and clinically his edema has improved.  We will switch him over to PO lasix.  He was counseled on importance of medical compliance.  Active Problems:    IRON DEFIC ANEMIA SEC DIET IRON INTAKE, on outpatient iron infusion therapy, hgb stable   COPD, on chronic oxygen, no signs of wheezing, appears to be at baseline   Pleural effusion due to CHF (congestive heart failure), Repeat xray shows improvement   UTI, positive urinalysis, on short course of cipro    Plans will be to discharge to SNF, CSW to assist in placement, he should be ready for discharge in next 24-48hrs     LOS: 2 days   Kiyona Mcnall [facility] Pager: [phone] 08/08/2011, 5:47 PM

## 2011-08-08 NOTE — Telephone Encounter (Signed)
She is aware 

## 2011-08-08 NOTE — Evaluation (Signed)
Physical Therapy Evaluation Patient Details Name: [name] MRN: [id number] DOB: Mar 03, 1922 Today's Date: 08/08/2011  Problem List:  Patient Active Problem List  Diagnoses  . PROSTATE CANCER  . IMPAIRED GLUCOSE TOLERANCE  . HYPERLIPIDEMIA  . OBESITY  . IRON DEFIC ANEMIA SEC DIET IRON INTAKE  . HYPERTENSION  . VALVULAR HEART DISEASE  . ATRIAL FIBRILLATION, CHRONIC  . OSTEOARTHRITIS  . KNEE, ARTHRITIS, DEGEN./OSTEO  . IMPAIRED FASTING GLUCOSE  . COPD  . Diastolic heart failure  . Decubitus ulcers  . Respiratory failure, acute and chronic  . Peripheral vascular disease  . NSTEMI (non-ST elevated myocardial infarction)  . Foot pain, left  . COPD (chronic obstructive pulmonary disease)  . Difficulty in walking  . Balance problem  . Pleural effusion due to CHF (congestive heart failure)  . Diastolic CHF, acute on chronic    Past Medical History:  Past Medical History  Diagnosis Date  . Valvular heart disease   . Obesity   . Osteoarthritis   . Hypertension   . Hyperlipidemia   . COPD (chronic obstructive pulmonary disease)   . CHF (congestive heart failure) diastolic 10/2010  . Pneumonia   . Impaired glucose tolerance   . Atrial fibrillation     Chronic  . Elevated prostate specific antigen (PSA)   . Cancer     Prostate  . Ulcer     internal-net placed and has been fixed  . Leg pain   . GERD (gastroesophageal reflux disease)   . Chronic kidney disease   . CAD (coronary artery disease)   . Hiatal hernia   . Hard of hearing   . Anemia   . Shortness of breath    Past Surgical History:  Past Surgical History  Procedure Date  . Left knee   . Ulcer surgery   . Bladder surgery   . Coronary artery bypass graft   . Rca with stent replacement      1994  . Left sfa stent 12-06-10    Stenting of left superficial femoral artery and popliteal artery  . Appendectomy   . Cardiac valve replacement 2003  . Eye surgery 2007    bilateral cataracts  .  Tonsillectomy     thinks they were removed while in the [employer]  . Mass excision 06/07/2011    Procedure: EXCISION MASS;  Surgeon: Fabio Bering;  Location: [facility];  Service: General;  Laterality: N/A;  excision of 2 masses back and buttocks  . Back surgery     PT Assessment/Plan/Recommendation PT Assessment Clinical Impression Statement: pt is very cooperative and well motivated,verbalizes a desire to improve overall mobility...per daughter, he has not been able to walk much at home since he broke the toes on his L foot 4 weeks ago...currently, he shows to be deconditioned with limited ambulatory endurance...daughter and pt would like for him to go to [facility] at d/c for short term rehab..he would be appropriate for this or would need HHPT at d/c if he doesn't go to SNF PT Recommendation/Assessment: Patient will need skilled PT in the acute care venue PT Problem List: Decreased activity tolerance;Decreased strength;Decreased mobility;Decreased safety awareness;Cardiopulmonary status limiting activity;Decreased knowledge of precautions Problem List Comments: pt is O2 dependent Barriers to Discharge: None PT Therapy Diagnosis : Difficulty walking;Abnormality of gait;Generalized weakness PT Plan PT Frequency: Min 3X/week PT Treatment/Interventions: DME instruction;Gait training;Functional mobility training;Therapeutic activities;Therapeutic exercise;Patient/family education PT Recommendation Follow Up Recommendations: Home health PT;Skilled nursing facility Equipment Recommended: 3 in 1 bedside comode PT Goals  Acute Rehab PT Goals PT Goal Formulation: With patient/family Time For Goal Achievement: 7 days Pt will Ambulate: 51 - 150 feet;with supervision;with rolling walker PT Goal: Ambulate - Progress: Goal set today  PT Evaluation Precautions/Restrictions  Precautions Precautions: Fall Required Braces or Orthoses: No Restrictions Weight Bearing Restrictions: No Prior Functioning  Home  Living Lives With: Daughter Receives Help From: Family Type of Home: Mobile home Home Layout: One level Home Access: Ramped entrance Bathroom Shower/Tub:  (takes sponge bath) Home Adaptive Equipment: Walker - rolling;Wheelchair - powered Prior Function Level of Independence: Independent with basic ADLs;Independent with gait;Independent with transfers;Requires assistive device for independence Driving: No Vocation: Retired Producer, television/film/video: Awake/alert Overall Cognitive Status: Appears within functional limits for tasks assessed Orientation Level: Oriented X4 Cognition - Other Comments: daughter states that pt is confused at times Sensation/Coordination Sensation Light Touch: Appears Intact Stereognosis: Not tested Hot/Cold: Not tested Proprioception: Appears Intact Coordination Gross Motor Movements are Fluid and Coordinated: Yes Fine Motor Movements are Fluid and Coordinated: Yes Extremity Assessment RUE Assessment RUE Assessment: Within Functional Limits LUE Assessment LUE Assessment: Within Functional Limits RLE Assessment RLE Assessment: Within Functional Limits LLE Assessment LLE Assessment: Within Functional Limits Mobility (including Balance) Bed Mobility Bed Mobility: Yes Supine to Sit: 6: Modified independent (Device/Increase time);HOB elevated (Comment degrees) (HOB at 30 deg) Sit to Supine: 6: Modified independent (Device/Increase time) Transfers Transfers: Yes Sit to Stand: 5: Supervision Sit to Stand Details (indicate cue type and reason): to maintain safety Stand to Sit: 5: Supervision Stand to Sit Details: cues to get fully against the chair and to use UEs for assist Ambulation/Gait Ambulation/Gait: Yes Ambulation/Gait Assistance: 4: Min assist Ambulation/Gait Assistance Details (indicate cue type and reason): verbal cues to stand inside the walker, stand more erect...slight assist to properly guide walker Ambulation Distance  (Feet): 40 Feet Assistive device: Rolling walker Gait Pattern: Trunk flexed;Shuffle Stairs: No Wheelchair Mobility Wheelchair Mobility: No  Posture/Postural Control Posture/Postural Control: Postural limitations Postural Limitations: thoracic kyphosis Balance Balance Assessed: No Exercise    End of Session PT - End of Session Equipment Utilized During Treatment: Gait belt Activity Tolerance: Patient tolerated treatment well Patient left: in chair;with call bell in reach;with bed alarm set;with family/visitor present Nurse Communication: Mobility status for transfers;Mobility status for ambulation General Behavior During Session: [facility] for tasks performed Cognition: [facility] for tasks performed  Konrad Penta 08/08/2011, 11:48 AM

## 2011-08-08 NOTE — Progress Notes (Signed)
Patient had a run of V-Tach. Assessed the patient and he denies chest pain or any discomfort. VS are stable.m Will continue to monitor the patient. His current cardiac rhythm is  A-fib which has been rhythm since admission.

## 2011-08-08 NOTE — Plan of Care (Signed)
Problem: Phase I Progression Outcomes Goal: Pain controlled with appropriate interventions Outcome: Not Applicable Date Met:  08/08/11 Pt denies pain  Problem: Phase II Progression Outcomes Goal: Pain controlled Outcome: Completed/Met Date Met:  08/08/11 Denies pain Goal: Dyspnea controlled with activity Outcome: Adequate for Discharge Ambulated in room with physical therapist Goal: Walk in hall or up in chair TID Outcome: Completed/Met Date Met:  08/08/11 Up in  chair

## 2011-08-09 ENCOUNTER — Inpatient Hospital Stay
Admission: EM | Admit: 2011-08-09 | Discharge: 2011-08-30 | Disposition: A | Discharge status: Home / Self Care | Payer: Medicare Other | Source: Ambulatory Visit | Attending: Internal Medicine | Admitting: Internal Medicine

## 2011-08-09 LAB — BASIC METABOLIC PANEL
BUN: 20 mg/dL (ref 6–23)
Calcium: 8.6 mg/dL (ref 8.4–10.5)
Creatinine, Ser: 1.07 mg/dL (ref 0.50–1.35)
GFR calc non Af Amer: 60 mL/min — ABNORMAL LOW (ref >90)
Glucose, Bld: 134 mg/dL — ABNORMAL HIGH (ref 70–99)

## 2011-08-09 MED ORDER — ALBUTEROL SULFATE (5 MG/ML) 0.5% IN NEBU: 2.5000 mg | INHALATION_SOLUTION | Freq: Three times a day (TID) | RESPIRATORY_TRACT | Status: DC

## 2011-08-09 MED ORDER — FUROSEMIDE 20 MG PO TABS: 40.0000 mg | ORAL_TABLET | Freq: Two times a day (BID) | ORAL | Status: DC

## 2011-08-09 MED ORDER — IPRATROPIUM BROMIDE 0.02 % IN SOLN: 0.5000 mg | Freq: Three times a day (TID) | RESPIRATORY_TRACT | Status: DC

## 2011-08-09 MED ORDER — CIPROFLOXACIN HCL 500 MG PO TABS: 500.0000 mg | ORAL_TABLET | Freq: Two times a day (BID) | ORAL | Status: AC

## 2011-08-09 MED ORDER — HYDROCODONE-ACETAMINOPHEN 5-500 MG PO TABS: 1.0000 | ORAL_TABLET | Freq: Four times a day (QID) | ORAL | Status: DC | PRN

## 2011-08-09 NOTE — Progress Notes (Signed)
Patient for d/c today to SNF bed at [facility]. Patient and daughter agreeable and pleased with this plan and location-  Reece Levy, MSW, [place] [phone]

## 2011-08-09 NOTE — Discharge Summary (Signed)
Physician Discharge Summary  Patient ID: [name] MRN: [id number] DOB/AGE: 02-08-1922 78 y.o.  Admit date: 08/06/2011 Discharge date: 08/09/2011  Primary Care Physician:  [name], MD, MD   Discharge Diagnoses:    Principal Problem:  *Diastolic CHF, acute on chronic Active Problems:  IRON DEFIC ANEMIA SEC DIET IRON INTAKE  COPD  Respiratory failure, acute and chronic  Peripheral vascular disease  COPD (chronic obstructive pulmonary disease)  Pleural effusion due to CHF (congestive heart failure) UTI   Medication List  As of 08/09/2011 12:54 PM   STOP taking these medications         ADVAIR DISKUS 100-50 MCG/DOSE Aepb      albuterol (2.5 MG/3ML) 0.083% nebulizer solution      K-DUR 20 MEQ tablet      LASIX 20 MG tablet      PRILOSEC 40 MG capsule      VICODIN 5-500 MG per tablet         TAKE these medications         albuterol (5 MG/ML) 0.5% nebulizer solution   Commonly known as: PROVENTIL   Take 0.5 mLs (2.5 mg total) by nebulization 3 (three) times daily.      aspirin 81 MG EC tablet   Take 81 mg by mouth daily.      ciprofloxacin 500 MG tablet   Commonly known as: CIPRO   Take 1 tablet (500 mg total) by mouth 2 (two) times daily.      clopidogrel 75 MG tablet   Commonly known as: PLAVIX   Take 1 tablet (75 mg total) by mouth daily.      Fluticasone-Salmeterol 100-50 MCG/DOSE Aepb   Commonly known as: ADVAIR   Inhale 1 puff into the lungs every 12 (twelve) hours.      furosemide 20 MG tablet   Commonly known as: LASIX   Take 2 tablets (40 mg total) by mouth 2 (two) times daily.      HYDROcodone-acetaminophen 5-500 MG per tablet   Commonly known as: VICODIN   Take 1 tablet by mouth every 6 (six) hours as needed. Pain      ipratropium 0.02 % nebulizer solution   Commonly known as: ATROVENT   Take 2.5 mLs (0.5 mg total) by nebulization 3 (three) times daily.      lovastatin 40 MG tablet   Commonly known as: MEVACOR   Take 40 mg  by mouth as directed. On Sunday, Monday, Wednesday & Friday      metoprolol 50 MG tablet   Commonly known as: LOPRESSOR   Take 25 mg by mouth 2 (two) times daily.      multivitamin per tablet   Take 1 tablet by mouth daily.      neomycin-polymyxin-hydrocortisone 3.5-10000-1 ophthalmic suspension   Commonly known as: CORTISPORIN   Place 1 drop into both eyes daily as needed. Itchy eyes      omeprazole 40 MG capsule   Commonly known as: PRILOSEC   Take 40 mg by mouth daily.      potassium chloride SA 20 MEQ tablet   Commonly known as: K-DUR,KLOR-CON   Take 20 mEq by mouth daily.           Discharge Exam: Patient is sitting up in chair, denies any shortness of breath, cough Blood pressure 134/82, pulse 80, temperature 97.4 F (36.3 C), temperature source Oral, resp. rate 20, height 6\' 2"  (1.88 m), weight 110.3 kg (243 lb 2.7 oz), SpO2 93.00%. NAD Crackles  at bases S1, S2 RRR Soft, NT, BS+ No edema b/l  Disposition and Follow-up:  Patient will be discharged to [facility] for physical therapy He can follow up with his primary doctor once he is discharged from the facility  Consults: none   Significant Diagnostic Studies:  Dg Chest Portable 1 View  08/06/2011  *RADIOLOGY REPORT*  Clinical Data: Weakness and congestion.  History of COPD and CHF. Former smoker.  PORTABLE CHEST - 1 VIEW  Comparison: Chest x-ray 01/18/2011.  Findings: Moderate left-sided pleural effusion.  Complete opacification of the base of the left hemithorax likely reflects the presence of pleural fluid, cardiomegaly and underlying atelectasis (airspace consolidation in the left lower lobe would be difficult to exclude, however).  Right lung appears relatively clear.  No definite right-sided pleural effusion.  Pulmonary vasculature is normal.  A moderate - severe enlargement of the cardiopericardial silhouette is noted. The patient is rotated to the right on today's exam, resulting in distortion of the  mediastinal contours and reduced diagnostic sensitivity and specificity for mediastinal pathology.  Status post median sternotomy.  Atherosclerosis in the thoracic aorta.  IMPRESSION:  1.  Enlarging left-sided pleural effusion which is now moderate in size. 2.  Probable atelectasis in the left lower lobe (airspace consolidation is difficult to exclude). 3.  Moderate - severe enlargement of the cardiopericardial silhouette.  This may reflect cardiomegaly and/or the presence of a pericardial effusion.  Clinical correlation is recommended. 4.  Atherosclerosis. 5.  Status post median sternotomy.  Original Report Authenticated By: Florencia Reasons, M.D.    Brief H and P: For complete details please refer to admission H and P, but in brief This is an 78 year old gentleman with history of chronic diastolic congestive heart failure who lives at home with his daughter. His daughter reports that she has noticed that the patient was becoming increasingly short of breath and edematous for the past week. She noticed swelling around his face, his legs, and he repeatedly complained of shortness of breath. Patient is chronically on home oxygen, has a history of COPD. She also reports that he is on home Lasix. The patient has not been taking his Lasix for approximately 2 weeks now. His daughter reports that he has not been taking it since it makes him use the bathroom frequently and has difficult time getting to the bathroom in time. There has not been any chest pain. No fever. He does have a cough, which is chronic. No other complaints.  Patient was evaluated in the emergency room and was found to be in fluid overload. He was given a dose of Lasix and his daughter reports significant improvement with this. He has been referred for admission. 2-D echocardiogram done in the emergency room showed the patient did not have any pericardial effusion, and an intact the EF. This is per report from the emergency room  physician.     Hospital Course:  Patient was admitted to the hospital for volume overload since he had not taken any of his lasix in approximately 2 weeks.  Patient was started on IV lasix and has done very well with this.  His generalized edema and respiratory status has significantly improved.  He was found to have a left sided pleural effusion on admission.  Repeat chest xray after diuresis shows improvement of this effusion.  Patient is chronically on oxygen for COPD.  He is approaching his baseline respiratory status.  He is being transitioned back to oral lasix.  He was  explained the importance of complying with his medications and taking his lasix.   A 2D echo was done which showed a preserved ejection fraction.  He may follow up with [place] cardiology for further management of this heart failure.  Patient's COPD and chronic resp failure have remained stable.  He is chronically on 2L oxygen.  Currently he is on 3L, and this can be titrated down as patient tolerates.  Patient was found to have a positive urinalysis with nitrites and leukocytes.  He has been started on a course of cipro and will complete 7 days.  Patient's hemoglobin, although low, has remained stable as well.  He is being followed by the hematology clinic and received IV iron therapy.  He does not have any evidence of bleeding.  Disposition, patient will be discharged to [facility] for physical therapy  Time spent on Discharge:  Signed: [facility] Pager: [phone] 08/09/2011, 12:54 PM

## 2011-08-09 NOTE — Progress Notes (Signed)
SNF bed available at [facility]- Advised patient and daughter- will advise MD. Reece Levy, MSW, LCSWA [phone]

## 2011-08-09 NOTE — Progress Notes (Signed)
Patient discharged to [facility], patient and daughter verbalize understanding of discharge instructions. Report called to [place], Charity fundraiser. Patient transported by staff.

## 2011-08-17 ENCOUNTER — Other Ambulatory Visit: Payer: Self-pay | Admitting: Family Medicine

## 2011-08-29 ENCOUNTER — Other Ambulatory Visit (HOSPITAL_COMMUNITY): Payer: Medicare Other

## 2011-09-03 ENCOUNTER — Other Ambulatory Visit: Payer: Self-pay | Admitting: Family Medicine

## 2011-09-03 ENCOUNTER — Telehealth: Payer: Self-pay | Admitting: Family Medicine

## 2011-09-03 MED ORDER — FUROSEMIDE 40 MG PO TABS: 40.0000 mg | ORAL_TABLET | Freq: Every day | ORAL | Status: DC

## 2011-09-03 MED ORDER — FUROSEMIDE 20 MG PO TABS: ORAL_TABLET | ORAL | Status: DC

## 2011-09-03 MED ORDER — POTASSIUM CHLORIDE CRYS ER 20 MEQ PO TBCR: 20.0000 meq | EXTENDED_RELEASE_TABLET | Freq: Two times a day (BID) | ORAL | Status: DC

## 2011-09-03 NOTE — Telephone Encounter (Signed)
pls fax over the lasix 20mg  tab strength printed, and write d/c lasix 40mg  please, thanks

## 2011-09-03 NOTE — Telephone Encounter (Signed)
Cbc and BMP, needed, also let her know the lasix and potassium are being sent to [facility]  As requested

## 2011-09-03 NOTE — Telephone Encounter (Signed)
Done and letter faxed

## 2011-09-03 NOTE — Telephone Encounter (Signed)
What labs does he need to get? Had some labwork done last month at [facility] also (cbc, bmp)

## 2011-09-04 NOTE — Telephone Encounter (Signed)
thanks

## 2011-09-10 DIAGNOSIS — I5033 Acute on chronic diastolic (congestive) heart failure: Secondary | ICD-10-CM

## 2011-09-10 DIAGNOSIS — J449 Chronic obstructive pulmonary disease, unspecified: Secondary | ICD-10-CM

## 2011-09-10 DIAGNOSIS — J9620 Acute and chronic respiratory failure, unspecified whether with hypoxia or hypercapnia: Secondary | ICD-10-CM

## 2011-09-10 DIAGNOSIS — I509 Heart failure, unspecified: Secondary | ICD-10-CM

## 2011-09-13 ENCOUNTER — Encounter: Payer: Self-pay | Admitting: Family Medicine

## 2011-09-13 LAB — BASIC METABOLIC PANEL
Calcium: 9.1 mg/dL (ref 8.4–10.5)
Potassium: 5.3 mEq/L (ref 3.5–5.3)
Sodium: 139 mEq/L (ref 135–145)

## 2011-09-13 LAB — CBC WITH DIFFERENTIAL/PLATELET
Basophils Absolute: 0.1 10*3/uL (ref 0.0–0.1)
Basophils Relative: 1 % (ref 0–1)
Eosinophils Absolute: 0.3 10*3/uL (ref 0.0–0.7)
Hemoglobin: 10.6 g/dL — ABNORMAL LOW (ref 13.0–17.0)
MCHC: 28.6 g/dL — ABNORMAL LOW (ref 30.0–36.0)
Monocytes Relative: 13 % — ABNORMAL HIGH (ref 3–12)
Neutro Abs: 4.8 10*3/uL (ref 1.7–7.7)
Neutrophils Relative %: 69 % (ref 43–77)
Platelets: 264 10*3/uL (ref 150–400)
RDW: 21.9 % — ABNORMAL HIGH (ref 11.5–15.5)

## 2011-09-13 LAB — HEPATIC FUNCTION PANEL
ALT: 14 U/L (ref 0–53)
Albumin: 4 g/dL (ref 3.5–5.2)
Alkaline Phosphatase: 76 U/L (ref 39–117)
Total Protein: 6.9 g/dL (ref 6.0–8.3)

## 2011-09-13 LAB — LIPID PANEL
Cholesterol: 127 mg/dL (ref 0–200)
HDL: 50 mg/dL (ref >39)
LDL Cholesterol: 62 mg/dL (ref 0–99)
Triglycerides: 75 mg/dL (ref <150)

## 2011-09-13 LAB — HEMOGLOBIN A1C: Mean Plasma Glucose: 100 mg/dL (ref <117)

## 2011-09-18 ENCOUNTER — Ambulatory Visit: Payer: Medicare Other | Admitting: Family Medicine

## 2011-09-19 ENCOUNTER — Encounter: Payer: Self-pay | Admitting: Family Medicine

## 2011-09-19 ENCOUNTER — Ambulatory Visit (INDEPENDENT_AMBULATORY_CARE_PROVIDER_SITE_OTHER): Payer: Medicare Other | Admitting: Family Medicine

## 2011-09-19 VITALS — BP 102/70 | HR 74 | Resp 15 | Ht 74.0 in | Wt 233.0 lb

## 2011-09-19 DIAGNOSIS — M1990 Unspecified osteoarthritis, unspecified site: Secondary | ICD-10-CM

## 2011-09-19 DIAGNOSIS — E785 Hyperlipidemia, unspecified: Secondary | ICD-10-CM

## 2011-09-19 DIAGNOSIS — I5030 Unspecified diastolic (congestive) heart failure: Secondary | ICD-10-CM

## 2011-09-19 DIAGNOSIS — I10 Essential (primary) hypertension: Secondary | ICD-10-CM

## 2011-09-19 DIAGNOSIS — E739 Lactose intolerance, unspecified: Secondary | ICD-10-CM

## 2011-09-19 DIAGNOSIS — R5383 Other fatigue: Secondary | ICD-10-CM

## 2011-09-19 DIAGNOSIS — I4891 Unspecified atrial fibrillation: Secondary | ICD-10-CM

## 2011-09-19 DIAGNOSIS — R5381 Other malaise: Secondary | ICD-10-CM

## 2011-09-19 DIAGNOSIS — M1710 Unilateral primary osteoarthritis, unspecified knee: Secondary | ICD-10-CM

## 2011-09-19 MED ORDER — HYDROCODONE-ACETAMINOPHEN 5-500 MG PO TABS: 1.0000 | ORAL_TABLET | Freq: Every day | ORAL | Status: DC

## 2011-09-19 NOTE — Patient Instructions (Addendum)
F/u in 4 month  You look well .  Remember to measure fluid 2 liters per 24 hrs, and take the lasix as prescribed, and use the oxygen.  One tablet at bedtime for arthritis pain   Fasting lipid cmp, TSH

## 2011-09-19 NOTE — Progress Notes (Signed)
  Subjective:    Patient ID: [name], male    DOB: 03-24-1922, 78 y.o.   MRN: [id number]  HPI The PT is here for follow up and re-evaluation of chronic medical conditions, medication management and review of any available recent lab and radiology data.  Preventive health is updated, specifically  Cancer screening and Immunization.   Recently hospitalized with acute on chronic heart failure and COPD flare. Recovered in skilled nursing facility,and has subsequently celebrated a birthday. Feels well overall. The PT denies any adverse reactions to current medications since the last visit.  There are no new concerns.  C/o uncontrolled arthritic pain and requests different medication. Daughter requests I stress the importance of compliance with treatment plan to prevent recurrent CHF flare      Review of Systems See HPI Denies recent fever or chills. Denies sinus pressure, nasal congestion, ear pain or sore throat. Denies chest congestion, productive cough or wheezing.short of breath with activity, which is limited Denies chest pains, palpitations and leg swelling Denies abdominal pain, nausea, vomiting,diarrhea or constipation.   Denies dysuria, frequency, hesitancy or incontinence. Chronic and uncontrolled  joint pain,  and limitation in mobility. Denies headaches, seizures, numbness, or tingling. Denies depression, anxiety or insomnia. Denies skin break down or rash.        Objective:   Physical Exam Patient alert and oriented and in no cardiopulmonary distress.Pt oxygen dependent and wheelchair dependent  HEENT: No facial asymmetry, EOMI, no sinus tenderness,  oropharynx pink and moist.  Neck decreased ROM, no adenopathy.  Chest: Clear to auscultation bilaterally.Decreased air entry throughout  CVS: S1, S2 systolic murmur, no S3.  ABD: Soft non tender. Bowel sounds normal.  Ext: No edema  MS: decreased  ROM spine, shoulders, hips and knees.  Skin: Intact, no  ulcerations or rash noted.  Psych: Good eye contact, normal affect. Memory impaired not anxious or depressed appearing.  CNS: CN 2-12 intact, power, normal throughout.        Assessment & Plan:

## 2011-09-21 NOTE — Assessment & Plan Note (Signed)
Controlled, no change in medication Hyperlipidemia:Low fat diet discussed and encouraged.  \ 

## 2011-09-21 NOTE — Assessment & Plan Note (Signed)
Adequate rate control 

## 2011-09-21 NOTE — Assessment & Plan Note (Signed)
Controlled, no change in medication  

## 2011-09-21 NOTE — Assessment & Plan Note (Signed)
Deteriorated, required hospitalization followed by SNF care recently. Importance of fluid and salt restriction stressed as well as medication compliance

## 2011-09-21 NOTE — Assessment & Plan Note (Signed)
Severe and progressive, requests alternate pain medication , judicious use advised

## 2011-09-23 ENCOUNTER — Telehealth: Payer: Self-pay | Admitting: Family Medicine

## 2011-10-01 ENCOUNTER — Telehealth: Payer: Self-pay

## 2011-10-01 NOTE — Telephone Encounter (Signed)
I saw printed lab, HB i much improved 10.6 was 8 he does not need a transfusion in my opinion , he is responding well to treatment

## 2011-10-01 NOTE — Telephone Encounter (Signed)
Not necessarily, and I suggest she discuss this further with Dr Mariel Sleet who is treating her Dad for anemia

## 2011-10-02 NOTE — Telephone Encounter (Signed)
Daughter aware.

## 2011-10-15 ENCOUNTER — Encounter (HOSPITAL_COMMUNITY): Payer: Self-pay | Admitting: *Deleted

## 2011-10-15 ENCOUNTER — Emergency Department (HOSPITAL_COMMUNITY)
Admission: EM | Admit: 2011-10-15 | Discharge: 2011-10-15 | Disposition: A | Discharge status: Home / Self Care | Payer: Medicare Other | Attending: Emergency Medicine | Admitting: Emergency Medicine

## 2011-10-15 DIAGNOSIS — I129 Hypertensive chronic kidney disease with stage 1 through stage 4 chronic kidney disease, or unspecified chronic kidney disease: Secondary | ICD-10-CM | POA: Insufficient documentation

## 2011-10-15 DIAGNOSIS — N189 Chronic kidney disease, unspecified: Secondary | ICD-10-CM | POA: Insufficient documentation

## 2011-10-15 DIAGNOSIS — I2510 Atherosclerotic heart disease of native coronary artery without angina pectoris: Secondary | ICD-10-CM | POA: Insufficient documentation

## 2011-10-15 DIAGNOSIS — I4891 Unspecified atrial fibrillation: Secondary | ICD-10-CM | POA: Insufficient documentation

## 2011-10-15 DIAGNOSIS — R040 Epistaxis: Secondary | ICD-10-CM | POA: Insufficient documentation

## 2011-10-15 DIAGNOSIS — J449 Chronic obstructive pulmonary disease, unspecified: Secondary | ICD-10-CM | POA: Insufficient documentation

## 2011-10-15 DIAGNOSIS — E785 Hyperlipidemia, unspecified: Secondary | ICD-10-CM | POA: Insufficient documentation

## 2011-10-15 DIAGNOSIS — Z951 Presence of aortocoronary bypass graft: Secondary | ICD-10-CM | POA: Insufficient documentation

## 2011-10-15 DIAGNOSIS — J4489 Other specified chronic obstructive pulmonary disease: Secondary | ICD-10-CM | POA: Insufficient documentation

## 2011-10-15 DIAGNOSIS — I509 Heart failure, unspecified: Secondary | ICD-10-CM | POA: Insufficient documentation

## 2011-10-15 MED ORDER — SILVER NITRATE-POT NITRATE 75-25 % EX MISC
CUTANEOUS | Status: AC
  Administered 2011-10-15: 12:00:00
  Filled 2011-10-15: qty 2

## 2011-10-15 MED ORDER — OXYMETAZOLINE HCL 0.05 % NA SOLN
1.0000 | Freq: Once | NASAL | Status: AC
  Administered 2011-10-15: 1 via NASAL
  Filled 2011-10-15: qty 15

## 2011-10-15 NOTE — ED Provider Notes (Signed)
History     CSN: [id number]  Arrival date & time 10/15/11  1031   First MD Initiated Contact with Patient 10/15/11 1104      Chief Complaint  Patient presents with  . Epistaxis    (Consider location/radiation/quality/duration/timing/severity/associated sxs/prior treatment) Patient is a 78 y.o. male presenting with nosebleeds. The history is provided by the patient and a relative. No language interpreter was used.  Epistaxis  This is a recurrent problem. Episode onset: early this AM. The problem has been resolved. Associated with: pt recently had oxygen via nasal cannula upped from two to three liters and "it really dries my nose out". He has tried applying pressure for the symptoms. The treatment provided significant relief.    Past Medical History  Diagnosis Date  . Valvular heart disease   . Obesity   . Osteoarthritis   . Hypertension   . Hyperlipidemia   . COPD (chronic obstructive pulmonary disease)   . CHF (congestive heart failure) diastolic 10/2010  . Pneumonia   . Impaired glucose tolerance   . Atrial fibrillation     Chronic  . Elevated prostate specific antigen (PSA)   . Cancer     Prostate  . Ulcer     internal-net placed and has been fixed  . Leg pain   . GERD (gastroesophageal reflux disease)   . Chronic kidney disease   . CAD (coronary artery disease)   . Hiatal hernia   . Hard of hearing   . Anemia   . Shortness of breath     Past Surgical History  Procedure Date  . Left knee   . Ulcer surgery   . Bladder surgery   . Coronary artery bypass graft   . Rca with stent replacement      1994  . Left sfa stent 12-06-10    Stenting of left superficial femoral artery and popliteal artery  . Appendectomy   . Cardiac valve replacement 2003  . Eye surgery 2007    bilateral cataracts  . Tonsillectomy     thinks they were removed while in the [employer]  . Mass excision 06/07/2011    Procedure: EXCISION MASS;  Surgeon: Fabio Bering;  Location: [facility];   Service: General;  Laterality: N/A;  excision of 2 masses back and buttocks  . Back surgery     Family History  Problem Relation Age of Onset  . Stroke Mother   . Stroke Brother   . Heart attack Brother   . Cancer Brother   . Cancer Brother   . Anesthesia problems Neg Hx   . Hypotension Neg Hx   . Malignant hyperthermia Neg Hx   . Pseudochol deficiency Neg Hx     History  Substance Use Topics  . Smoking status: Former Smoker -- 0.0 packs/day for 78 years    Types: Cigarettes    Quit date: 06/24/2010  . Smokeless tobacco: Not on file  . Alcohol Use: No      Review of Systems  HENT: Positive for nosebleeds.   Respiratory:       No hemoptysi  Gastrointestinal:       No melena or hematochezia   Genitourinary: Negative for hematuria.  Neurological: Negative for weakness and light-headedness.  All other systems reviewed and are negative.    Allergies  Gabapentin  Home Medications   Current Outpatient Rx  Name Route Sig Dispense Refill  . ALBUTEROL SULFATE (5 MG/ML) 0.5% IN NEBU Nebulization Take 0.5 mLs (2.5 mg total)  by nebulization 3 (three) times daily. 20 mL   . ASPIRIN 81 MG PO TBEC Oral Take 81 mg by mouth daily.      Marland Kitchen CLOPIDOGREL BISULFATE 75 MG PO TABS Oral Take 75 mg by mouth daily.    Marland Kitchen FLUTICASONE-SALMETEROL 100-50 MCG/DOSE IN AEPB Inhalation Inhale 1 puff into the lungs every 12 (twelve) hours.    . FUROSEMIDE 20 MG PO TABS Oral Take 60 mg by mouth daily.    Marland Kitchen HYDROCODONE-ACETAMINOPHEN 5-500 MG PO TABS Oral Take 1 tablet by mouth at bedtime as needed. Pain in Foot    . IPRATROPIUM BROMIDE 0.02 % IN SOLN Nebulization Take 2.5 mLs (0.5 mg total) by nebulization 3 (three) times daily. 75 mL   . LOVASTATIN 40 MG PO TABS Oral Take 40 mg by mouth at bedtime. Only takes on Sun, Mon, Wed, and Fri.    . METOPROLOL TARTRATE 50 MG PO TABS Oral Take 25 mg by mouth 2 (two) times daily.    . MULTIVITAMINS PO TABS Oral Take 1 tablet by mouth daily.      .  NEOMYCIN-POLYMYXIN-HC 3.5-10000-1 OP SUSP Both Eyes Place 1 drop into both eyes daily as needed. Itchy eyes    . OMEPRAZOLE 40 MG PO CPDR Oral Take 40 mg by mouth daily.    Marland Kitchen POTASSIUM CHLORIDE CRYS ER 20 MEQ PO TBCR Oral Take 1 tablet (20 mEq total) by mouth 2 (two) times daily. 60 tablet 5    BP 103/53  Pulse 87  Temp(Src) 98.1 F (36.7 C) (Oral)  Resp 20  Ht 6\' 3"  (1.905 m)  Wt 225 lb (102.059 kg)  BMI 28.12 kg/m2  SpO2 88%  Physical Exam  Nursing note and vitals reviewed. Constitutional: He is oriented to person, place, and time. He appears well-developed and well-nourished.  HENT:  Head: Normocephalic and atraumatic.  Right Ear: External ear normal.  Left Ear: External ear normal.  Nose: Epistaxis is observed.       Dried blood evident around L nare.  Small lesion noted on L septal wall is likely source of bleeding previously.  Eyes: EOM are normal.  Neck: Normal range of motion.  Cardiovascular: Normal rate, regular rhythm, normal heart sounds and intact distal pulses.   Pulmonary/Chest: Effort normal and breath sounds normal. No respiratory distress.  Abdominal: Soft. He exhibits no distension. There is no tenderness.  Musculoskeletal: Normal range of motion.  Neurological: He is alert and oriented to person, place, and time.  Skin: Skin is warm and dry.  Psychiatric: He has a normal mood and affect. Judgment normal.    ED Course  EPISTAXIS MANAGEMENT Date/Time: 10/15/2011 11:40 AM Performed by: Worthy Rancher Authorized by: Worthy Rancher Consent: Verbal consent obtained. Written consent not obtained. Risks and benefits: risks, benefits and alternatives were discussed Consent given by: patient Patient understanding: patient states understanding of the procedure being performed Imaging studies: imaging studies not available Required items: required blood products, implants, devices, and special equipment available Patient identity confirmed: verbally with  patient Time out: Immediately prior to procedure a "time out" was called to verify the correct patient, procedure, equipment, support staff and site/side marked as required. Patient sedated: no Treatment site: left Kiesselbach's area Repair method: suspected bleeding site cauterized with two silver nitrate sticks.   Post-procedure assessment: bleeding stopped Treatment complexity: simple Patient tolerance: Patient tolerated the procedure well with no immediate complications. Comments: Prior to cautery, pt had 2 drops of afrin inserted in nostril.   (  including critical care time)  Labs Reviewed - No data to display No results found.   1. Left-sided epistaxis       MDM  2afrin spray BID vaseline in nose Return if any further bleeding that you can't control       Worthy Rancher, PA 10/15/11 1347

## 2011-10-15 NOTE — ED Notes (Signed)
Pt resting in bed, nose not bleeding at present.  Stated was feeling better.

## 2011-10-15 NOTE — ED Notes (Signed)
Pt c/o nosebleed off and on x 1 month. Pt states that it has been constantly bleeding since 7 am. Pt has large clot hanging from nose.

## 2011-10-15 NOTE — Discharge Instructions (Signed)
Nosebleed Nosebleeds can be caused by many conditions including trauma, infections, polyps, foreign bodies, dry mucous membranes or climate, medications and air conditioning. Most nosebleeds occur in the front of the nose. It is because of this location that most nosebleeds can be controlled by pinching the nostrils gently and continuously. Do this for at least 10 to 20 minutes. The reason for this long continuous pressure is that you must hold it long enough for the blood to clot. If during that 10 to 20 minute time period, pressure is released, the process may have to be started again. The nosebleed may stop by itself, quit with pressure, need concentrated heating (cautery) or stop with pressure from packing. HOME CARE INSTRUCTIONS   If your nose was packed, try to maintain the pack inside until your caregiver removes it. If a gauze pack was used and it starts to fall out, gently replace or cut the end off. Do not cut if a balloon catheter was used to pack the nose. Otherwise, do not remove unless instructed.   Avoid blowing your nose for 12 hours after treatment. This could dislodge the pack or clot and start bleeding again.   If the bleeding starts again, sit up and bending forward, gently pinch the front half of your nose continuously for 20 minutes.   If bleeding was caused by dry mucous membranes, cover the inside of your nose every morning with a petroleum or antibiotic ointment. Use your little fingertip as an applicator. Do this as needed during dry weather. This will keep the mucous membranes moist and allow them to heal.   Maintain humidity in your home by using less air conditioning or using a humidifier.   Do not use aspirin or medications which make bleeding more likely. Your caregiver can give you recommendations on this.   Resume normal activities as able but try to avoid straining, lifting or bending at the waist for several days.   If the nosebleeds become recurrent and the cause  is unknown, your caregiver may suggest laboratory tests.  SEEK IMMEDIATE MEDICAL CARE IF:   Bleeding recurs and cannot be controlled.   There is unusual bleeding from or bruising on other parts of the body.   You have a fever.   Nosebleeds continue.   There is any worsening of the condition which originally brought you in.   You become lightheaded, feel faint, become sweaty or vomit blood.  MAKE SURE YOU:   Understand these instructions.   Will watch your condition.   Will get help right away if you are not doing well or get worse.  Document Released: 03/20/2005 Document Revised: 05/30/2011 Document Reviewed: 05/12/2009 [facility] Patient Information 2012 [place], [place].   Insert 2 drops of afrin in left nostril every 12 hrs for the next several days.  Avoid heavy coughing or sneezing and bending over.  If you have any further bleeding pinch nostrils together for 30 minutes.  If you can't control bleeding return to the ED.  Apply thin layer of vaseline in nostrils to avoid excessive drying of the tissues from your oxygen.

## 2011-10-15 NOTE — ED Notes (Signed)
Daughter reports that pt  Has been getting nosebleeds off/on for several days, has been home from nursing home since march.  Pt alert, large clot noted on left nare, pt stated that it has been bleeding from same nare everytime.  He is home 02-2l all the time and nose started bleeding this am around 7am, and they are unable to get to stop bleeding, daughter is worried that pt may need oxygen.

## 2011-10-15 NOTE — ED Notes (Signed)
nt reported that pt sats 88% on room air, pt repositioned in bed and 02 placed at 2l by nasal cannula, sats increased to 93%, daughter stated that pt's normal sats are 94%

## 2011-10-16 ENCOUNTER — Telehealth: Payer: Self-pay | Admitting: Family Medicine

## 2011-10-16 NOTE — ED Provider Notes (Signed)
Medical screening examination/treatment/procedure(s) were performed by non-physician practitioner and as supervising physician I was immediately available for consultation/collaboration.  Nicoletta Dress. Colon Branch, MD 10/16/11 2025

## 2011-10-17 ENCOUNTER — Other Ambulatory Visit: Payer: Self-pay | Admitting: Family Medicine

## 2011-10-17 MED ORDER — METHOCARBAMOL 500 MG PO TABS: ORAL_TABLET | ORAL | Status: DC

## 2011-10-17 NOTE — Telephone Encounter (Signed)
Response in another note

## 2011-10-17 NOTE — Telephone Encounter (Signed)
Went to ER Monday and had vein burst in nose. ER told her to consult with PCP about plavix and asa. I wasn't able to return her call to find out what she needed so she stopped the asa. Should he stay off the asa for a few weeks. He is still taking plavix  Please advise

## 2011-10-23 ENCOUNTER — Telehealth: Payer: Self-pay | Admitting: Family Medicine

## 2011-10-23 NOTE — Telephone Encounter (Signed)
Spoke with her and she is aware 

## 2011-10-25 ENCOUNTER — Other Ambulatory Visit: Payer: Self-pay | Admitting: Family Medicine

## 2011-10-29 ENCOUNTER — Encounter (HOSPITAL_COMMUNITY): Payer: Medicare Other | Attending: Oncology

## 2011-10-29 DIAGNOSIS — Z8711 Personal history of peptic ulcer disease: Secondary | ICD-10-CM | POA: Insufficient documentation

## 2011-10-29 DIAGNOSIS — N189 Chronic kidney disease, unspecified: Secondary | ICD-10-CM | POA: Insufficient documentation

## 2011-10-29 DIAGNOSIS — M1710 Unilateral primary osteoarthritis, unspecified knee: Secondary | ICD-10-CM | POA: Insufficient documentation

## 2011-10-29 DIAGNOSIS — R7309 Other abnormal glucose: Secondary | ICD-10-CM | POA: Insufficient documentation

## 2011-10-29 DIAGNOSIS — K219 Gastro-esophageal reflux disease without esophagitis: Secondary | ICD-10-CM | POA: Insufficient documentation

## 2011-10-29 DIAGNOSIS — I4891 Unspecified atrial fibrillation: Secondary | ICD-10-CM | POA: Insufficient documentation

## 2011-10-29 DIAGNOSIS — J4489 Other specified chronic obstructive pulmonary disease: Secondary | ICD-10-CM | POA: Insufficient documentation

## 2011-10-29 DIAGNOSIS — E669 Obesity, unspecified: Secondary | ICD-10-CM | POA: Insufficient documentation

## 2011-10-29 DIAGNOSIS — D508 Other iron deficiency anemias: Secondary | ICD-10-CM

## 2011-10-29 DIAGNOSIS — I252 Old myocardial infarction: Secondary | ICD-10-CM | POA: Insufficient documentation

## 2011-10-29 DIAGNOSIS — I5022 Chronic systolic (congestive) heart failure: Secondary | ICD-10-CM | POA: Insufficient documentation

## 2011-10-29 DIAGNOSIS — I38 Endocarditis, valve unspecified: Secondary | ICD-10-CM | POA: Insufficient documentation

## 2011-10-29 DIAGNOSIS — Z8546 Personal history of malignant neoplasm of prostate: Secondary | ICD-10-CM | POA: Insufficient documentation

## 2011-10-29 DIAGNOSIS — D509 Iron deficiency anemia, unspecified: Secondary | ICD-10-CM | POA: Insufficient documentation

## 2011-10-29 DIAGNOSIS — I509 Heart failure, unspecified: Secondary | ICD-10-CM | POA: Insufficient documentation

## 2011-10-29 DIAGNOSIS — J449 Chronic obstructive pulmonary disease, unspecified: Secondary | ICD-10-CM | POA: Insufficient documentation

## 2011-10-29 DIAGNOSIS — I129 Hypertensive chronic kidney disease with stage 1 through stage 4 chronic kidney disease, or unspecified chronic kidney disease: Secondary | ICD-10-CM | POA: Insufficient documentation

## 2011-10-29 DIAGNOSIS — I2510 Atherosclerotic heart disease of native coronary artery without angina pectoris: Secondary | ICD-10-CM | POA: Insufficient documentation

## 2011-10-29 DIAGNOSIS — E785 Hyperlipidemia, unspecified: Secondary | ICD-10-CM | POA: Insufficient documentation

## 2011-10-29 DIAGNOSIS — K449 Diaphragmatic hernia without obstruction or gangrene: Secondary | ICD-10-CM | POA: Insufficient documentation

## 2011-10-29 LAB — IRON AND TIBC: UIBC: 329 ug/dL (ref 125–400)

## 2011-10-29 LAB — CBC
HCT: 25.3 % — ABNORMAL LOW (ref 39.0–52.0)
Hemoglobin: 7.4 g/dL — ABNORMAL LOW (ref 13.0–17.0)
MCH: 22.8 pg — ABNORMAL LOW (ref 26.0–34.0)
MCHC: 29.2 g/dL — ABNORMAL LOW (ref 30.0–36.0)
RDW: 17.5 % — ABNORMAL HIGH (ref 11.5–15.5)

## 2011-10-29 NOTE — Progress Notes (Signed)
Labs drawn today for cbc,ferr,Iron and IBC 

## 2011-10-30 ENCOUNTER — Encounter (HOSPITAL_BASED_OUTPATIENT_CLINIC_OR_DEPARTMENT_OTHER): Payer: Medicare Other | Admitting: Oncology

## 2011-10-30 VITALS — BP 101/62 | HR 76 | Temp 97.9°F | Wt 231.1 lb

## 2011-10-30 DIAGNOSIS — R5381 Other malaise: Secondary | ICD-10-CM

## 2011-10-30 DIAGNOSIS — D508 Other iron deficiency anemias: Secondary | ICD-10-CM

## 2011-10-30 DIAGNOSIS — D509 Iron deficiency anemia, unspecified: Secondary | ICD-10-CM

## 2011-10-30 DIAGNOSIS — R0602 Shortness of breath: Secondary | ICD-10-CM

## 2011-10-30 DIAGNOSIS — J449 Chronic obstructive pulmonary disease, unspecified: Secondary | ICD-10-CM

## 2011-10-30 MED ORDER — SODIUM CHLORIDE 0.9 % IV SOLN
1020.0000 mg | Freq: Once | INTRAVENOUS | Status: AC
  Administered 2011-10-30: 1020 mg via INTRAVENOUS
  Filled 2011-10-30: qty 34

## 2011-10-30 NOTE — Patient Instructions (Signed)
[facility]  Discharge Instructions  RECOMMENDATIONS MADE BY THE CONSULTANT AND ANY TEST RESULTS WILL BE SENT TO YOUR REFERRING DOCTOR.   Iron infusion today. We will schedule you for monthly lab work. We will have you come back to see the doctor in 3 months.   I acknowledge that I have been informed and understand all the instructions given to me and received a copy. I do not have any more questions at this time, but understand that I may call the Specialty Clinic at [facility] at [phone] during business hours should I have any further questions or need assistance in obtaining follow-up care.    __________________________________________  _____________  __________ Signature of Patient or Authorized Representative            Date                   Time    __________________________________________ Nurse's Signature

## 2011-10-30 NOTE — Progress Notes (Signed)
Leonard Overman, MD, MD [address], [address] [place] [address]  1. IRON DEFIC ANEMIA SEC DIET IRON INTAKE  oxymetazoline (AFRIN) 0.05 % nasal spray, ferumoxytol (FERAHEME) 1,020 mg in sodium chloride 0.9 % 100 mL IVPB, CBC, Ferritin, CBC, Ferritin, CBC, Ferritin    CURRENT THERAPY: Feraheme 1020 mg today.    INTERVAL HISTORY: [name] 78 y.o. male returns for  regular  visit for followup of iron deficiency anemia.  Mr. Posthumus was in the hospital in February for fluid overload/ CHF.   He was admitted and treated for this.  He re-presented to the ED in April with a persistent nosebleed.  It sounds like this nare vessel was cauterized with silver nitrate sticks.   The patient does admits that his energy level has decreased.  His sleeping pattern is altered and is not sleeping well.  All of these symptoms are typical for him when his iron is low.   I personally reviewed and went over laboratory results with the patient.  His ferritin is 8 with an Hgb of 7.4.  Thus, we will give him Feraheme today and recheck lab work in 4 weeks.  He may require another Feraheme infusion in the near future pending his lab results.   Other than his epistaxis, the patient denies any blood loss.   Past Medical History  Diagnosis Date  . Valvular heart disease   . Obesity   . Osteoarthritis   . Hypertension   . Hyperlipidemia   . COPD (chronic obstructive pulmonary disease)   . CHF (congestive heart failure) diastolic 10/2010  . Pneumonia   . Impaired glucose tolerance   . Atrial fibrillation     Chronic  . Elevated prostate specific antigen (PSA)   . Cancer     Prostate  . Ulcer     internal-net placed and has been fixed  . Leg pain   . GERD (gastroesophageal reflux disease)   . Chronic kidney disease   . CAD (coronary artery disease)   . Hiatal hernia   . Hard of hearing   . Anemia   . Shortness of breath     has PROSTATE CANCER; IMPAIRED GLUCOSE TOLERANCE; HYPERLIPIDEMIA;  OBESITY; IRON DEFIC ANEMIA SEC DIET IRON INTAKE; HYPERTENSION; VALVULAR HEART DISEASE; ATRIAL FIBRILLATION, CHRONIC; OSTEOARTHRITIS; KNEE, ARTHRITIS, DEGEN./OSTEO; IMPAIRED FASTING GLUCOSE; COPD; Diastolic heart failure; Decubitus ulcers; Respiratory failure, acute and chronic; Peripheral vascular disease; NSTEMI (non-ST elevated myocardial infarction); Foot pain, left; COPD (chronic obstructive pulmonary disease); Difficulty in walking; Balance problem; Pleural effusion due to CHF (congestive heart failure); and Diastolic CHF, acute on chronic on his problem list.     is allergic to gabapentin.  Mr. Friday had no medications administered during this visit.  Past Surgical History  Procedure Date  . Left knee   . Ulcer surgery   . Bladder surgery   . Coronary artery bypass graft   . Rca with stent replacement      1994  . Left sfa stent 12-06-10    Stenting of left superficial femoral artery and popliteal artery  . Appendectomy   . Cardiac valve replacement 2003  . Eye surgery 2007    bilateral cataracts  . Tonsillectomy     thinks they were removed while in the [employer]  . Mass excision 06/07/2011    Procedure: EXCISION MASS;  Surgeon: Fabio Bering;  Location: [facility];  Service: General;  Laterality: N/A;  excision of 2 masses back and buttocks  . Back  surgery     Denies any headaches, dizziness, double vision, fevers, chills, night sweats, nausea, vomiting, diarrhea, constipation, chest pain, heart palpitations, blood in stool, black tarry stool, urinary pain, urinary burning, urinary frequency, hematuria.   PHYSICAL EXAMINATION  ECOG PERFORMANCE STATUS: 3 - Symptomatic, >50% confined to bed  Filed Vitals:   10/30/11 1340  BP: 101/62  Pulse: 76  Temp: 97.9 F (36.6 C)    GENERAL:alert, no distress, well nourished, well developed, comfortable, cooperative, obese, smiling and nasal cannula in place, seen in wheelchair.  SKIN: skin color, texture, turgor are normal, no rashes  or significant lesions  HEAD: Normocephalic, No masses, lesions, tenderness or abnormalities  EYES: normal  EARS: External ears normal  OROPHARYNX:mucous membranes are moist  NECK: supple, trachea midline  LYMPH: no palpable lymphadenopathy  BREAST:not examined  LUNGS: clear to auscultation and percussion, decreased breath sounds  HEART: regular rate & rhythm, no murmurs, no gallops, S1 normal, S2 normal and occasional dropped heart sound  ABDOMEN:abdomen soft, non-tender, obese, normal bowel sounds and unable to assess for hepatosplenomegaly due patient positioning  EXTREMITIES:no edema  NEURO: alert & oriented x 3 with fluent speech    LABORATORY DATA: Results for JESIEL, GARATE (MRN [id number]) as of 10/30/2011 14:14  Ref. Range 10/29/2011 09:57  Iron Latest Range: 42-135 ug/dL 13 (L)  UIBC Latest Range: 125-400 ug/dL 409  TIBC Latest Range: 215-435 ug/dL 811  Saturation Ratios Latest Range: 20-55 % 4 (L)  Ferritin Latest Range: 22-322 ng/mL 8 (L)  WBC Latest Range: 4.0-10.5 K/uL 7.6  RBC Latest Range: 4.22-5.81 MIL/uL 3.24 (L)  Hemoglobin Latest Range: 13.0-17.0 g/dL 7.4 (L)  HCT Latest Range: 39.0-52.0 % 25.3 (L)  MCV Latest Range: 78.0-100.0 fL 78.1  MCH Latest Range: 26.0-34.0 pg 22.8 (L)  MCHC Latest Range: 30.0-36.0 g/dL 91.4 (L)  RDW Latest Range: 11.5-15.5 % 17.5 (H)  Platelets Latest Range: 150-400 K/uL 306      ASSESSMENT:  1. Iron deficiency anemia, secondary to poor absorption  2. COPD  3. Obesity  4. Chronic weakness, fatigue, tiredness, SOB   PLAN:  1. I personally reviewed and went over laboratory results with the patient. 2. Feraheme 1020 mg today 3. Discussion regarding iron deficiency anemia. 4. Lab work monthly: CBC, ferritin 5. Return in 3 months for follow-up will administer IV Feraheme 1020 mg as needed pending lab results.   All questions were answered. The patient knows to call the clinic with any problems, questions or concerns. We can  certainly see the patient much sooner if necessary.  The patient and plan discussed with [name], MD and he is in agreement with the aforementioned.  [name]

## 2011-11-06 NOTE — Telephone Encounter (Signed)
Responded to by Dr. Lodema Hong - labs ready for review in [facility]

## 2011-11-25 ENCOUNTER — Encounter (HOSPITAL_COMMUNITY): Payer: Medicare Other | Attending: Oncology

## 2011-11-25 DIAGNOSIS — D508 Other iron deficiency anemias: Secondary | ICD-10-CM | POA: Insufficient documentation

## 2011-11-25 LAB — CBC
HCT: 29.1 % — ABNORMAL LOW (ref 39.0–52.0)
Hemoglobin: 8.8 g/dL — ABNORMAL LOW (ref 13.0–17.0)
MCV: 85.6 fL (ref 78.0–100.0)
RBC: 3.4 MIL/uL — ABNORMAL LOW (ref 4.22–5.81)
RDW: 22.3 % — ABNORMAL HIGH (ref 11.5–15.5)
WBC: 6.1 10*3/uL (ref 4.0–10.5)

## 2011-11-25 NOTE — Progress Notes (Signed)
Lab draw

## 2011-11-26 ENCOUNTER — Other Ambulatory Visit (HOSPITAL_COMMUNITY): Payer: Self-pay | Admitting: Oncology

## 2011-11-26 DIAGNOSIS — D508 Other iron deficiency anemias: Secondary | ICD-10-CM

## 2011-11-29 ENCOUNTER — Encounter (HOSPITAL_BASED_OUTPATIENT_CLINIC_OR_DEPARTMENT_OTHER): Payer: Medicare Other

## 2011-11-29 VITALS — BP 99/61 | HR 81 | Temp 98.0°F

## 2011-11-29 DIAGNOSIS — D508 Other iron deficiency anemias: Secondary | ICD-10-CM

## 2011-11-29 DIAGNOSIS — D509 Iron deficiency anemia, unspecified: Secondary | ICD-10-CM

## 2011-11-29 MED ORDER — SODIUM CHLORIDE 0.9 % IV SOLN
INTRAVENOUS | Status: DC
  Administered 2011-11-29: 250 mL via INTRAVENOUS

## 2011-11-29 MED ORDER — SODIUM CHLORIDE 0.9 % IV SOLN
1020.0000 mg | Freq: Once | INTRAVENOUS | Status: AC
  Administered 2011-11-29: 1020 mg via INTRAVENOUS
  Filled 2011-11-29: qty 34

## 2011-11-29 NOTE — Progress Notes (Signed)
[name] tolerated infusion well and without incident; verbalizes understanding for follow-up.  No distress noted at time of discharge and patient was discharged home with his daughter.  

## 2011-12-11 ENCOUNTER — Telehealth: Payer: Self-pay

## 2011-12-16 ENCOUNTER — Other Ambulatory Visit: Payer: Self-pay | Admitting: Family Medicine

## 2011-12-17 NOTE — Telephone Encounter (Signed)
pls find out exactly what test is being recommended and document, and advise I will wait on new additional med until I see pt at next visit. I have had no calls from pt or his daughter that he is not doing well

## 2011-12-20 NOTE — Telephone Encounter (Signed)
Script for OCD titration written pls fax, as for the med let therapist know I will address at next OV, thanks

## 2011-12-20 NOTE — Telephone Encounter (Signed)
Faxed

## 2011-12-20 NOTE — Telephone Encounter (Signed)
The ocd titration uses a low flow delivery device to reduce the wast of oxygen during the expiratory phase of breathing.  The respiratory therapist would like to try this with this patient.

## 2011-12-23 ENCOUNTER — Other Ambulatory Visit: Payer: Self-pay | Admitting: Family Medicine

## 2011-12-24 ENCOUNTER — Other Ambulatory Visit: Payer: Self-pay | Admitting: *Deleted

## 2011-12-24 DIAGNOSIS — I70219 Atherosclerosis of native arteries of extremities with intermittent claudication, unspecified extremity: Secondary | ICD-10-CM

## 2011-12-24 MED ORDER — CLOPIDOGREL BISULFATE 75 MG PO TABS: 75.0000 mg | ORAL_TABLET | Freq: Every day | ORAL | Status: DC

## 2011-12-30 ENCOUNTER — Encounter (HOSPITAL_COMMUNITY): Payer: Medicare Other | Attending: Oncology

## 2011-12-30 DIAGNOSIS — D509 Iron deficiency anemia, unspecified: Secondary | ICD-10-CM

## 2011-12-30 DIAGNOSIS — D508 Other iron deficiency anemias: Secondary | ICD-10-CM | POA: Insufficient documentation

## 2011-12-30 LAB — CBC
HCT: 29.2 % — ABNORMAL LOW (ref 39.0–52.0)
Hemoglobin: 8.9 g/dL — ABNORMAL LOW (ref 13.0–17.0)
MCH: 27.2 pg (ref 26.0–34.0)
MCV: 89.3 fL (ref 78.0–100.0)
Platelets: 262 10*3/uL (ref 150–400)
RBC: 3.27 MIL/uL — ABNORMAL LOW (ref 4.22–5.81)
WBC: 7 10*3/uL (ref 4.0–10.5)

## 2011-12-30 NOTE — Progress Notes (Signed)
Labs drawn today for cbc,ferr 

## 2011-12-31 ENCOUNTER — Encounter (HOSPITAL_BASED_OUTPATIENT_CLINIC_OR_DEPARTMENT_OTHER): Payer: Medicare Other

## 2011-12-31 DIAGNOSIS — D509 Iron deficiency anemia, unspecified: Secondary | ICD-10-CM

## 2011-12-31 MED ORDER — SODIUM CHLORIDE 0.9 % IJ SOLN
INTRAMUSCULAR | Status: AC
  Filled 2011-12-31: qty 10

## 2011-12-31 MED ORDER — SODIUM CHLORIDE 0.9 % IV SOLN
INTRAVENOUS | Status: DC
  Administered 2011-12-31: 11:00:00 via INTRAVENOUS

## 2011-12-31 MED ORDER — SODIUM CHLORIDE 0.9 % IJ SOLN
10.0000 mL | INTRAMUSCULAR | Status: DC | PRN
  Administered 2011-12-31: 10 mL via INTRAVENOUS
  Filled 2011-12-31: qty 10

## 2011-12-31 MED ORDER — SODIUM CHLORIDE 0.9 % IV SOLN
1020.0000 mg | Freq: Once | INTRAVENOUS | Status: AC
  Administered 2011-12-31: 1020 mg via INTRAVENOUS
  Filled 2011-12-31: qty 34

## 2012-01-15 ENCOUNTER — Other Ambulatory Visit: Payer: Self-pay | Admitting: Family Medicine

## 2012-01-21 ENCOUNTER — Telehealth: Payer: Self-pay | Admitting: Family Medicine

## 2012-01-21 ENCOUNTER — Ambulatory Visit (INDEPENDENT_AMBULATORY_CARE_PROVIDER_SITE_OTHER): Payer: Medicare Other | Admitting: Family Medicine

## 2012-01-21 ENCOUNTER — Encounter: Payer: Self-pay | Admitting: Family Medicine

## 2012-01-21 VITALS — BP 112/70 | HR 71 | Resp 16 | Ht 74.0 in | Wt 225.0 lb

## 2012-01-21 DIAGNOSIS — E669 Obesity, unspecified: Secondary | ICD-10-CM

## 2012-01-21 DIAGNOSIS — H9190 Unspecified hearing loss, unspecified ear: Secondary | ICD-10-CM

## 2012-01-21 DIAGNOSIS — E739 Lactose intolerance, unspecified: Secondary | ICD-10-CM

## 2012-01-21 DIAGNOSIS — Z0100 Encounter for examination of eyes and vision without abnormal findings: Secondary | ICD-10-CM

## 2012-01-21 DIAGNOSIS — E785 Hyperlipidemia, unspecified: Secondary | ICD-10-CM

## 2012-01-21 DIAGNOSIS — I10 Essential (primary) hypertension: Secondary | ICD-10-CM

## 2012-01-21 DIAGNOSIS — R5381 Other malaise: Secondary | ICD-10-CM

## 2012-01-21 DIAGNOSIS — H6120 Impacted cerumen, unspecified ear: Secondary | ICD-10-CM

## 2012-01-21 DIAGNOSIS — R7301 Impaired fasting glucose: Secondary | ICD-10-CM

## 2012-01-21 DIAGNOSIS — J449 Chronic obstructive pulmonary disease, unspecified: Secondary | ICD-10-CM

## 2012-01-21 NOTE — Patient Instructions (Addendum)
Annual wellness exam in 3.5 months, please call if you need me before  You are referred to ENT for evaluation and treatment of hearing l;oss.  Bilateral ear irrigation in office today.   Please start going to the senior citizens group, this will do both you and the people you meet with, a lot of good  Fasting chem 7, lipid hepatic and TSH 3 to 5 days before next visit

## 2012-01-21 NOTE — Telephone Encounter (Signed)
pls refer as requested, has been entered in referral

## 2012-01-27 ENCOUNTER — Encounter (HOSPITAL_COMMUNITY): Payer: Medicare Other | Attending: Oncology

## 2012-01-27 ENCOUNTER — Other Ambulatory Visit: Payer: Self-pay

## 2012-01-27 DIAGNOSIS — D509 Iron deficiency anemia, unspecified: Secondary | ICD-10-CM | POA: Insufficient documentation

## 2012-01-27 DIAGNOSIS — D508 Other iron deficiency anemias: Secondary | ICD-10-CM

## 2012-01-27 LAB — CBC
Hemoglobin: 9.9 g/dL — ABNORMAL LOW (ref 13.0–17.0)
MCV: 93.6 fL (ref 78.0–100.0)
Platelets: 248 10*3/uL (ref 150–400)
RBC: 3.42 MIL/uL — ABNORMAL LOW (ref 4.22–5.81)
WBC: 6.9 10*3/uL (ref 4.0–10.5)

## 2012-01-27 LAB — FERRITIN: Ferritin: 47 ng/mL (ref 22–322)

## 2012-01-27 MED ORDER — OMEPRAZOLE 40 MG PO CPDR: DELAYED_RELEASE_CAPSULE | ORAL | Status: DC

## 2012-01-27 NOTE — Progress Notes (Signed)
Labs drawn today for cbc,ferr 

## 2012-01-28 ENCOUNTER — Telehealth (HOSPITAL_COMMUNITY): Payer: Self-pay | Admitting: *Deleted

## 2012-01-28 NOTE — Telephone Encounter (Signed)
Will wait till seen by Dr. Mariel Sleet in 2 weeks

## 2012-01-28 NOTE — Telephone Encounter (Signed)
Pt's daughter called to question if he needs iron infusion based on labs done 01/27/2012

## 2012-02-02 NOTE — Assessment & Plan Note (Signed)
Ear irrigation successful in both ears, no trauma to middle or outer ear

## 2012-02-02 NOTE — Assessment & Plan Note (Signed)
Worsening hearing loss , refer to ENT, may benefit from hearing aids, he has had  these in the past but had been non compliant

## 2012-02-02 NOTE — Assessment & Plan Note (Signed)
Controlled, no change in medication Updated labs needed

## 2012-02-02 NOTE — Progress Notes (Signed)
  Subjective:    Patient ID: [name], male    DOB: [birth year], 78 y.o.   MRN: [id number]  HPI The PT is here for follow up and re-evaluation of chronic medical conditions, medication management and review of any available recent lab and radiology data.  Preventive health is updated, specifically   Immunization.    The PT denies any adverse reactions to current medications since the last visit.  C/o increased fatigue over the past several months, now uses oxygen continually. Denies recent fever, chills, cough. Denies chest pain, PND or orthopnea or leg swelling C/o increased hearing loss wants further evaluation, also requests an appointment be set up for eye exam    Review of Systems See HPI Denies recent fever or chills. Denies sinus pressure, nasal congestion, ear pain or sore throat. Denies chest congestion, productive cough or wheezing. Denies chest pains, palpitations and leg swelling Denies abdominal pain, nausea, vomiting,diarrhea or constipation.   Denies dysuria, frequency, hesitancy or incontinence. Chronic  joint pain,  and limitation in mobility.Ambulates with asistance Denies headaches, seizures, numbness, or tingling. Denies depression, anxiety or insomnia. Denies skin break down or rash.        Objective:   Physical Exam  Patient alert and oriented and in no cardiopulmonary distress.Depends on supplemental oxygen and walks with assistance  HEENT: No facial asymmetry, EOMI, no sinus tenderness,  oropharynx pink and moist.  Neck decreased ROM, no adenopathy.  Chest: Clear to auscultation bilaterally.Decreased air entry throughout  CVS: S1, S2 systolic murmur, no S3.No jVD  ABD: Soft non tender. Bowel sounds normal.  Ext: No edema  MS: decreased  ROM spine, shoulders, hips and knees.  Skin: Intact, no ulcerations or rash noted.  Psych: Good eye contact, normal affect. Memory mildly impaired not anxious or depressed appearing.  CNS: CN 2-12  intact, power, tone and sensation normal throughout.       Assessment & Plan:

## 2012-02-02 NOTE — Assessment & Plan Note (Signed)
Controlled, no change in medication  

## 2012-02-02 NOTE — Assessment & Plan Note (Signed)
Worsening, c/o increased fatigue, reliant on continuous oxygen

## 2012-02-10 ENCOUNTER — Encounter (HOSPITAL_BASED_OUTPATIENT_CLINIC_OR_DEPARTMENT_OTHER): Payer: Medicare Other | Admitting: Oncology

## 2012-02-10 VITALS — BP 91/52 | HR 86 | Temp 97.6°F | Resp 20

## 2012-02-10 DIAGNOSIS — D509 Iron deficiency anemia, unspecified: Secondary | ICD-10-CM

## 2012-02-10 NOTE — Patient Instructions (Addendum)
Leonard REILEY  DOB 21-Jan-1922 CSN [id number]  MRN [id number] Dr. [name]   [facility]  Discharge Instructions  RECOMMENDATIONS MADE BY THE CONSULTANT AND ANY TEST RESULTS WILL BE SENT TO YOUR REFERRING DOCTOR.   EXAM FINDINGS BY MD TODAY AND SIGNS AND SYMPTOMS TO REPORT TO CLINIC OR PRIMARY MD: Exam and discussion per MD.  Will change the feraheme infusion to every 21 days.  MEDICATIONS PRESCRIBED: none   INSTRUCTIONS GIVEN AND DISCUSSED: Other :  Report increased fatigue or increased shortness of breath.  SPECIAL INSTRUCTIONS/FOLLOW-UP: Return to Clinic as scheduled for feraheme and to see MD in October.   I acknowledge that I have been informed and understand all the instructions given to me and received a copy. I do not have any more questions at this time, but understand that I may call the Specialty Clinic at [facility] at [phone] during business hours should I have any further questions or need assistance in obtaining follow-up care.    __________________________________________  _____________  __________ Signature of Patient or Authorized Representative            Date                   Time    __________________________________________ Nurse's Signature

## 2012-02-10 NOTE — Progress Notes (Signed)
Problem #1 iron deficiency anemia which I believe is due to poor absorption but more importantly GI blood losses in my opinion though he has had GU blood losses in the past as well. He is on aspirin and Plavix. He has stents in his lower strand of these but I will send a note to his vascular surgeon asking him whether we can stop the aspirin. He has had 1020 mg of feraheme for 3 consecutive months and we cannot get his ferritin above 50 and I cannot get his  hemoglobin above 9.9 g. In the past we are able to do this without any difficulty. He denies seeing any blood in his urine recently he does not see any active but does still and his daughter who is with him that he does not see any black tarry stool on his underwear. By with all the IV iron we have given him he should have a much higher hemoglobin and ferritin value.  I placed a call to Dr. Kendell Bane and have sent a note to Dr. [name] Gianotti and we'll see with her responses are. In the meantime I have discussed with him and his daughter giving him 4 doses of IV feraheme every 21 days with a CBC reticulocyte count and ferritin level each time to see if he needs the next subsequent dose. We will then decide on followup once we get his hemoglobin better than it is in his ferritin deathly better than it is. There is no new disease or drug regimen  that should be interfering with his ability to get his hemoglobin back into the 14-16 g range which is where we have had on multiple occasions in the past with replacement iron.

## 2012-02-11 ENCOUNTER — Encounter: Payer: Self-pay | Admitting: Internal Medicine

## 2012-02-11 ENCOUNTER — Encounter (HOSPITAL_BASED_OUTPATIENT_CLINIC_OR_DEPARTMENT_OTHER): Payer: Medicare Other

## 2012-02-11 VITALS — BP 96/63 | HR 74 | Temp 97.9°F | Resp 18

## 2012-02-11 DIAGNOSIS — D509 Iron deficiency anemia, unspecified: Secondary | ICD-10-CM

## 2012-02-11 DIAGNOSIS — D508 Other iron deficiency anemias: Secondary | ICD-10-CM

## 2012-02-11 MED ORDER — HEPARIN SOD (PORK) LOCK FLUSH 100 UNIT/ML IV SOLN
250.0000 [IU] | Freq: Once | INTRAVENOUS | Status: DC | PRN
  Filled 2012-02-11: qty 5

## 2012-02-11 MED ORDER — SODIUM CHLORIDE 0.9 % IV SOLN
1020.0000 mg | Freq: Once | INTRAVENOUS | Status: AC
  Administered 2012-02-11: 1020 mg via INTRAVENOUS
  Filled 2012-02-11: qty 34

## 2012-02-11 MED ORDER — SODIUM CHLORIDE 0.9 % IV SOLN
Freq: Once | INTRAVENOUS | Status: AC
  Administered 2012-02-11: 14:00:00 via INTRAVENOUS

## 2012-02-11 MED ORDER — SODIUM CHLORIDE 0.9 % IJ SOLN
10.0000 mL | INTRAMUSCULAR | Status: DC | PRN
  Administered 2012-02-11: 10 mL
  Filled 2012-02-11: qty 10

## 2012-02-11 MED ORDER — SODIUM CHLORIDE 0.9 % IJ SOLN
INTRAMUSCULAR | Status: AC
  Filled 2012-02-11: qty 10

## 2012-02-11 NOTE — Progress Notes (Signed)
Patient ID: [name], male   DOB: 12-14-1921, 78 y.o.   MRN: [id number] Dr. Mariel Sleet called me about this patient. Refractory iron deficiency anemia. No overt GI bleeding. Has been on aspirin and Plavix for PAD. Vascular surgeon has okayed patient coming off Plavix but he will remain on aspirin 81 mg daily. GI contact lastly made in 2006 colonoscopy : at that time TCS demonstrated multiple oozing AVMs from his right colon. Spoke to Dr. Mariel Sleet today. We'll arrange for the patient to come back to see [place]. We'll likely need a repeat colonoscopy with plus minus APC treatment of right colon lesions and evaluation for other pathology which could have crept up in the past 7 years.

## 2012-02-12 ENCOUNTER — Encounter: Payer: Self-pay | Admitting: Internal Medicine

## 2012-02-12 ENCOUNTER — Encounter (HOSPITAL_COMMUNITY): Payer: Medicare Other

## 2012-02-12 ENCOUNTER — Telehealth (HOSPITAL_COMMUNITY): Payer: Self-pay

## 2012-02-12 NOTE — Telephone Encounter (Signed)
From: Nada Libman, MD Sent: 02/11/2012 8:09 AM To: Randall An, MD I would like him on one or the other. This far out from his LE stents, I would be fine with ASA only (81mg ). He should be coming to see me soon. ----- Message ----- From: Randall An, MD Sent: 02/10/2012 3:22 PM To: Nada Libman, MD Hi [place], I am writing about the above patient who can't seem to hold onto iv iron and I am wondering if you think he could do just with his Plavix only and stop his aspirin. He has stents and his daughter tells me that all you wanted him on was the Plavix but that the aspirin was started by another physician while in a rehab. Center. If you need to discuss him, just call me at [phone] Thanks, Minerva Areola

## 2012-02-12 NOTE — Telephone Encounter (Deleted)
Message copied by Evelena Leyden on Wed Feb 12, 2012 12:10 PM ------      Message from: Mariel Sleet, ERIC S      Created: Wed Feb 12, 2012 10:39 AM       fyi      ----- Message -----         From: Nada Libman, MD         Sent: 02/11/2012   8:09 AM           To: Randall An, MD            I would like him on one or the other.  This far out from his LE stents, I would be fine with ASA only (81mg ).  He should be coming to see me soon.      ----- Message -----         From: Randall An, MD         Sent: 02/10/2012   3:22 PM           To: Nada Libman, MD            Hi Wells,      I am writing about the above patient who can't seem to hold onto iv iron and I am wondering if you think he could do just with his Plavix only and stop his aspirin.  He has stents and his daughter tells me that all you wanted him on was the Plavix but that the aspirin was started by another physician while in a rehab. Center.      If you need to discuss him, just call me at [phone]      Thanks,      Minerva Areola

## 2012-02-13 ENCOUNTER — Ambulatory Visit (INDEPENDENT_AMBULATORY_CARE_PROVIDER_SITE_OTHER): Payer: Medicare Other | Admitting: Otolaryngology

## 2012-02-13 ENCOUNTER — Ambulatory Visit (INDEPENDENT_AMBULATORY_CARE_PROVIDER_SITE_OTHER): Payer: Medicare Other | Admitting: Gastroenterology

## 2012-02-13 ENCOUNTER — Encounter: Payer: Self-pay | Admitting: Gastroenterology

## 2012-02-13 ENCOUNTER — Other Ambulatory Visit: Payer: Self-pay | Admitting: Internal Medicine

## 2012-02-13 VITALS — BP 98/49 | HR 72 | Temp 97.6°F | Ht 75.0 in | Wt 225.0 lb

## 2012-02-13 DIAGNOSIS — D509 Iron deficiency anemia, unspecified: Secondary | ICD-10-CM

## 2012-02-13 DIAGNOSIS — D649 Anemia, unspecified: Secondary | ICD-10-CM

## 2012-02-13 MED ORDER — PEG 3350-KCL-NA BICARB-NACL 420 G PO SOLR: 4000.0000 mL | ORAL | Status: AC

## 2012-02-13 NOTE — Patient Instructions (Addendum)
We have set you up for a colonoscopy and possible upper endoscopy with Dr. Jena Gauss in the near future.  Further recommendations to follow.

## 2012-02-13 NOTE — Progress Notes (Signed)
Primary Care Physician:  [name], MD Primary Gastroenterologist:  Dr. Jena Gauss   Chief Complaint  Patient presents with  . Anemia    HPI:   [name] is an 78 year old male who presents today at the request of Dr. Mariel Sleet due to refractory iron deficiency anemia. Hx of PAD on ASA and Plavix, but Plavix has been held for now and vascular surgeon aware. Last TCS in 2006 with multiple oozing AVMs. Ferritin 162 July 2012, with significant drop to 8 in Feb 2013. Now increased to 47. Hgb stable. No evidence of overt GI bleeding.  No hematochezia, no melena. No abdominal pain. No N/V. Omeprazole for GERD. No dysphagia. No change in bowel habits. Good appetite.   Past Medical History  Diagnosis Date  . Valvular heart disease   . Obesity   . Osteoarthritis   . Hypertension   . Hyperlipidemia   . COPD (chronic obstructive pulmonary disease)   . CHF (congestive heart failure) diastolic 10/2010  . Pneumonia   . Impaired glucose tolerance   . Atrial fibrillation     Chronic  . Elevated prostate specific antigen (PSA)   . Cancer     Prostate  . Ulcer     internal-net placed and has been fixed  . Leg pain   . GERD (gastroesophageal reflux disease)   . Chronic kidney disease   . CAD (coronary artery disease)   . Hiatal hernia   . Hard of hearing   . Anemia   . Shortness of breath   . Diabetes mellitus     Past Surgical History  Procedure Date  . Left knee   . Ulcer surgery   . Bladder surgery   . Coronary artery bypass graft   . Rca with stent replacement      1994  . Left sfa stent 12-06-10    Stenting of left superficial femoral artery and popliteal artery  . Appendectomy   . Cardiac valve replacement 2003  . Eye surgery 2007    bilateral cataracts  . Tonsillectomy     thinks they were removed while in the [employer]  . Mass excision 06/07/2011    Procedure: EXCISION MASS;  Surgeon: Fabio Bering;  Location: [facility];  Service: General;  Laterality: N/A;  excision  of 2 masses back and buttocks  . Back surgery   . Esophagogastroduodenoscopy 03/03/2003    Large, deep prepyloric ulcer, as described above without bleeding/ Normal esophagus  . Esophagogastroduodenoscopy 09/03/2004    normal throughout  . Colonoscopy 09/03/2004    small ulcer, without stigmata of bleeding  . Esophagogastroduodenoscopy 11/14/2004    Normal esophagus and small hiatal hernia, otherwise normal stomach   . Colonoscopy 11/14/2004    Internal hemorrhoids, otherwise normal rectum/ left-sided diverticula , diffusely oozing right colon mucosa without a discrete lesion amenable to endoscopic therapy. 2 diminutive polyps. FELT TO HAVE AVMs/telangiectasias  . Colonoscopy 01/29/2005    Normal rectum    Current Outpatient Prescriptions  Medication Sig Dispense Refill  . ADVAIR DISKUS 100-50 MCG/DOSE AEPB INHALE 1 PUFF BY MOUTH TWICE DAILY.  60 each  2  . albuterol (PROVENTIL) (2.5 MG/3ML) 0.083% nebulizer solution USE 1 VIAL IN NEBULIZER EVERY 6 HOURS AS NEEDED.  300 vial  2  . albuterol (PROVENTIL) (5 MG/ML) 0.5% nebulizer solution Take 0.5 mLs (2.5 mg total) by nebulization 3 (three) times daily.  20 mL    . aspirin 81 MG EC tablet Take 81 mg by mouth daily.        Marland Kitchen  furosemide (LASIX) 20 MG tablet Take 60 mg by mouth daily.      Marland Kitchen HYDROcodone-acetaminophen (VICODIN) 5-500 MG per tablet Take 1 tablet by mouth every 6 (six) hours as needed.      Marland Kitchen ipratropium (ATROVENT) 0.02 % nebulizer solution INHALE 1 VIAL VIA NEBULIZER EVERY 6 HOURS AS NEEDED FOR SHORTNESS OF BREATH.  250 mL  3  . lovastatin (MEVACOR) 40 MG tablet Take 40 mg by mouth at bedtime. Only takes on Sun, Mon, Wed, and Fri.      . metoprolol (LOPRESSOR) 50 MG tablet Take 25 mg by mouth 2 (two) times daily.      . multivitamin (THERAGRAN) per tablet Take 1 tablet by mouth daily.        Marland Kitchen neomycin-polymyxin-hydrocortisone (CORTISPORIN) 3.5-10000-1 ophthalmic suspension Place 1 drop into both eyes daily as needed. Itchy eyes       . omeprazole (PRILOSEC) 40 MG capsule TAKE ONE CAPSULE DAILY.  30 capsule  3  . oxymetazoline (AFRIN) 0.05 % nasal spray Place 1 spray into the nose as needed.      . potassium chloride SA (K-DUR,KLOR-CON) 20 MEQ tablet Take 1 tablet (20 mEq total) by mouth 2 (two) times daily.  60 tablet  5  . clopidogrel (PLAVIX) 75 MG tablet Take 1 tablet (75 mg total) by mouth daily.  30 tablet  6    Allergies as of 02/13/2012 - Review Complete 02/13/2012  Allergen Reaction Noted  . Gabapentin  06/04/2011    Family History  Problem Relation Age of Onset  . Stroke Mother   . Stroke Brother   . Heart attack Brother   . Cancer Brother   . Cancer Brother   . Anesthesia problems Neg Hx   . Hypotension Neg Hx   . Malignant hyperthermia Neg Hx   . Pseudochol deficiency Neg Hx     History   Social History  . Marital Status: Widowed    Spouse Name: N/A    Number of Children: 4  . Years of Education: N/A   Occupational History  . Retired    Social History Main Topics  . Smoking status: Former Smoker -- 0.0 packs/day for 76 years    Types: Cigarettes    Quit date: 06/24/2010  . Smokeless tobacco: Not on file  . Alcohol Use: No  . Drug Use: No  . Sexually Active: Not Currently   Other Topics Concern  . Not on file   Social History Narrative   Has 3 children living.1 son deceased at age 53 due to drunk driving.    Review of Systems: Gen: Denies any fever, chills, fatigue, weight loss, lack of appetite.  CV: Denies chest pain, + heart palpitations, peripheral edema, syncope.  Resp: +DOE  GI: Denies dysphagia or odynophagia. Denies jaundice, hematemesis, fecal incontinence. GU : Denies urinary burning, urinary frequency, urinary hesitancy MS: + joint pain Derm: Denies rash, itching, dry skin Psych: Denies depression, anxiety, memory loss, and confusion Heme: Denies bruising, bleeding, and enlarged lymph nodes.  Physical Exam: BP 98/49  Pulse 72  Temp 97.6 F (36.4 C)  (Temporal)  Ht 6\' 3"  (1.905 m)  Wt 225 lb (102.059 kg)  BMI 28.12 kg/m2 General:   Alert and oriented. Pleasant and cooperative. Well-nourished and well-developed.  Head:  Normocephalic and atraumatic. Eyes:  Without icterus, sclera clear and conjunctiva pink.  Ears:  Normal auditory acuity. Nose:  No deformity, discharge,  or lesions. Mouth:  No deformity or lesions, oral mucosa pink.  Neck:  Supple, without mass or thyromegaly. Lungs:  Clear to auscultation bilaterally. No wheezes, rales, or rhonchi. No distress.  Heart:  S1, S2 present without murmurs appreciated.  Abdomen:  Limited exam as pt in wheelchair.  +BS, soft, non-tender and non-distended. No HSM noted. No guarding or rebound. No masses appreciated.  Rectal:  Deferred  Msk:  Symmetrical without gross deformities.  Extremities:  Without clubbing or edema. Neurologic:  Alert and  oriented x4;  grossly normal neurologically. Skin:  Intact without significant lesions or rashes. Cervical Nodes:  No significant cervical adenopathy. Psych:  Alert and cooperative. Normal mood and affect.

## 2012-02-17 ENCOUNTER — Other Ambulatory Visit: Payer: Self-pay

## 2012-02-17 ENCOUNTER — Telehealth: Payer: Self-pay | Admitting: Family Medicine

## 2012-02-19 ENCOUNTER — Encounter: Payer: Self-pay | Admitting: Gastroenterology

## 2012-02-19 DIAGNOSIS — D509 Iron deficiency anemia, unspecified: Secondary | ICD-10-CM | POA: Insufficient documentation

## 2012-02-19 NOTE — Telephone Encounter (Signed)
This medicine says for ed use only when trying to reorder.  Do you want to want to authorize?

## 2012-02-19 NOTE — Telephone Encounter (Signed)
Daughter aware and states that he went to see the eye doctor on yesterday and he was prescribed some drops as well as oral meds for macular degeneration. fyi

## 2012-02-19 NOTE — Telephone Encounter (Signed)
Since has a steroid in there, I do not prescribe, it's an eye drop, pls explain to daughter, and she will need to have opthalmologist to prescribe. If he just has itchy eyes , no infection, yellow drainage , I can prescribe allergy eye drop, please let me know

## 2012-02-19 NOTE — Assessment & Plan Note (Signed)
78 year old male with refractory IDA in the setting of Plavix and ASA. Plavix has been put on hold for now. No overt signs of GI bleeding currently. Hx of AVMs of colon, question oozing from this. Will proceed with colonoscopy with possible APC treatment if necessary. Consider EGD at time of procedure due to significant drop in ferritin as documented above. No upper or lower GI symptoms currently.   Proceed with TCS+/- EGD with Dr. Jena Gauss in near future: the risks, benefits, and alternatives have been discussed with the patient in detail. The patient states understanding and desires to proceed.

## 2012-02-19 NOTE — Telephone Encounter (Signed)
noted 

## 2012-02-25 NOTE — Progress Notes (Signed)
Faxed to PCP

## 2012-03-02 ENCOUNTER — Encounter (HOSPITAL_COMMUNITY): Payer: Self-pay | Admitting: Pharmacy Technician

## 2012-03-04 ENCOUNTER — Encounter (HOSPITAL_COMMUNITY): Payer: Medicare Other | Attending: Oncology

## 2012-03-04 DIAGNOSIS — D509 Iron deficiency anemia, unspecified: Secondary | ICD-10-CM

## 2012-03-04 LAB — CBC
MCV: 92 fL (ref 78.0–100.0)
RBC: 3.37 MIL/uL — ABNORMAL LOW (ref 4.22–5.81)
WBC: 6.3 10*3/uL (ref 4.0–10.5)

## 2012-03-04 MED ORDER — SODIUM CHLORIDE 0.9 % IJ SOLN
10.0000 mL | Freq: Once | INTRAMUSCULAR | Status: AC
  Administered 2012-03-04: 10 mL via INTRAVENOUS
  Filled 2012-03-04: qty 10

## 2012-03-04 MED ORDER — SODIUM CHLORIDE 0.9 % IV SOLN
1020.0000 mg | Freq: Once | INTRAVENOUS | Status: AC
  Administered 2012-03-04: 1020 mg via INTRAVENOUS
  Filled 2012-03-04: qty 34

## 2012-03-04 MED ORDER — SODIUM CHLORIDE 0.9 % IV SOLN
Freq: Once | INTRAVENOUS | Status: AC
  Administered 2012-03-04: 15:00:00 via INTRAVENOUS

## 2012-03-05 ENCOUNTER — Ambulatory Visit (INDEPENDENT_AMBULATORY_CARE_PROVIDER_SITE_OTHER): Payer: Medicare Other

## 2012-03-05 ENCOUNTER — Ambulatory Visit (INDEPENDENT_AMBULATORY_CARE_PROVIDER_SITE_OTHER): Payer: Medicare Other | Admitting: Otolaryngology

## 2012-03-05 DIAGNOSIS — H903 Sensorineural hearing loss, bilateral: Secondary | ICD-10-CM

## 2012-03-05 DIAGNOSIS — Z23 Encounter for immunization: Secondary | ICD-10-CM

## 2012-03-05 LAB — FERRITIN: Ferritin: 48 ng/mL (ref 22–322)

## 2012-03-10 MED ORDER — SODIUM CHLORIDE 0.45 % IV SOLN
INTRAVENOUS | Status: DC
  Administered 2012-03-11: 13:00:00 via INTRAVENOUS

## 2012-03-11 ENCOUNTER — Encounter (HOSPITAL_COMMUNITY): Payer: Self-pay | Admitting: *Deleted

## 2012-03-11 ENCOUNTER — Encounter (HOSPITAL_COMMUNITY)
Admission: RE | Disposition: A | Discharge status: Home / Self Care | Payer: Self-pay | Source: Ambulatory Visit | Attending: Internal Medicine

## 2012-03-11 ENCOUNTER — Ambulatory Visit (HOSPITAL_COMMUNITY)
Admission: RE | Admit: 2012-03-11 | Discharge: 2012-03-11 | Disposition: A | Discharge status: Home / Self Care | Payer: Medicare Other | Source: Ambulatory Visit | Attending: Internal Medicine | Admitting: Internal Medicine

## 2012-03-11 DIAGNOSIS — D509 Iron deficiency anemia, unspecified: Secondary | ICD-10-CM | POA: Insufficient documentation

## 2012-03-11 DIAGNOSIS — D649 Anemia, unspecified: Secondary | ICD-10-CM

## 2012-03-11 DIAGNOSIS — K5521 Angiodysplasia of colon with hemorrhage: Secondary | ICD-10-CM | POA: Insufficient documentation

## 2012-03-11 DIAGNOSIS — J4489 Other specified chronic obstructive pulmonary disease: Secondary | ICD-10-CM | POA: Insufficient documentation

## 2012-03-11 DIAGNOSIS — I10 Essential (primary) hypertension: Secondary | ICD-10-CM | POA: Insufficient documentation

## 2012-03-11 DIAGNOSIS — E119 Type 2 diabetes mellitus without complications: Secondary | ICD-10-CM | POA: Insufficient documentation

## 2012-03-11 DIAGNOSIS — K5730 Diverticulosis of large intestine without perforation or abscess without bleeding: Secondary | ICD-10-CM | POA: Insufficient documentation

## 2012-03-11 DIAGNOSIS — D126 Benign neoplasm of colon, unspecified: Secondary | ICD-10-CM | POA: Insufficient documentation

## 2012-03-11 DIAGNOSIS — J449 Chronic obstructive pulmonary disease, unspecified: Secondary | ICD-10-CM | POA: Insufficient documentation

## 2012-03-11 HISTORY — PX: ESOPHAGOGASTRODUODENOSCOPY: SHX5428

## 2012-03-11 HISTORY — PX: COLONOSCOPY: SHX5424

## 2012-03-11 SURGERY — COLONOSCOPY
Anesthesia: Moderate Sedation

## 2012-03-11 MED ORDER — MEPERIDINE HCL 100 MG/ML IJ SOLN
INTRAMUSCULAR | Status: AC
  Filled 2012-03-11: qty 1

## 2012-03-11 MED ORDER — STERILE WATER FOR IRRIGATION IR SOLN
Status: DC | PRN
  Administered 2012-03-11: 14:00:00

## 2012-03-11 MED ORDER — MIDAZOLAM HCL 5 MG/5ML IJ SOLN
INTRAMUSCULAR | Status: DC | PRN
  Administered 2012-03-11 (×2): 1 mg via INTRAVENOUS
  Administered 2012-03-11: 2 mg via INTRAVENOUS

## 2012-03-11 MED ORDER — MEPERIDINE HCL 100 MG/ML IJ SOLN
INTRAMUSCULAR | Status: DC | PRN
  Administered 2012-03-11: 25 mg via INTRAVENOUS
  Administered 2012-03-11: 12.5 mg via INTRAVENOUS

## 2012-03-11 MED ORDER — MIDAZOLAM HCL 5 MG/5ML IJ SOLN
INTRAMUSCULAR | Status: AC
  Filled 2012-03-11: qty 10

## 2012-03-11 NOTE — Op Note (Signed)
[facility] [address] [place], [address]   ENDOSCOPY PROCEDURE REPORT  PATIENT: Leonard, [name]  MR#: [id number] BIRTHDATE: 04/04/1922 , 89  yrs. old GENDER: Male ENDOSCOPIST: R. Roetta Sessions, MD FACP [facility] R.  Roetta Sessions, MD FACP [facility] REFERRED BY:  [name], M.D.  [name], M.D.  [place] PROCEDURE DATE:  03/11/2012 PROCEDURE:     diagnostic EGD  INDICATIONS:     refractory iron deficiency anemia; see colonoscopy report  INFORMED CONSENT:   The risks, benefits, limitations, alternatives and imponderables have been discussed.  The potential for biopsy, esophogeal dilation, etc. have also been reviewed.  Questions have been answered.  All parties agreeable.  Please see the history and physical in the medical record for more information.  MEDICATIONS:    Versed 4 mg IV and Demerol 37.5 mg IV in divided doses.  DESCRIPTION OF PROCEDURE:   The EG-2990i ([id number])  endoscope was introduced through the mouth and advanced to the second portion of the duodenum without difficulty or limitations.  The mucosal surfaces were surveyed very carefully during advancement of the scope and upon withdrawal.  Retroflexion view of the proximal stomach and esophagogastric junction was performed.      FINDINGS: normal esophagus.Stomach empty. Small polyp in the antrum-not manipulated. No ulcer or infiltrating process. Deformity of the antrum but no abnormal mucosa. Pylorus patent. Normal first and second portion of the duodenum   THERAPEUTIC / DIAGNOSTIC MANEUVERS PERFORMED:  none   COMPLICATIONS:  None  IMPRESSION:  Deformity of the antrum;  small polyp in antrum-not manipulated. Otherwise normal exam.  RECOMMENDATIONS:  Continue to hold antiplatelet therapy. Discussed case with Dr. Mariel Sleet. I feel this gentleman benefit from a mesenteric angiogram and embolization of cecal hemangiomas as appropriate. He recommends that we get him back to his  vascular surgeon,  Dr. Earlie Counts. We'll get him an appointment there and send him copies of today's reports.    _______________________________ R. Roetta Sessions, MD FACP [facility] eSigned:  R. Roetta Sessions, MD FACP [facility] 03/11/2012 2:58 PM     CC:

## 2012-03-11 NOTE — H&P (View-Only) (Signed)
 Primary Care Physician:  Margaret Simpson, MD Primary Gastroenterologist:  Dr. Rourk   Chief Complaint  Patient presents with  . Anemia    HPI:   [name] is an 78-year-old male who presents today at the request of Dr. Neijstrom due to refractory iron deficiency anemia. Hx of PAD on ASA and Plavix, but Plavix has been held for now and vascular surgeon aware. Last TCS in 2006 with multiple oozing AVMs. Ferritin 162 July 2012, with significant drop to 8 in Feb 2013. Now increased to 47. Hgb stable. No evidence of overt GI bleeding.  No hematochezia, no melena. No abdominal pain. No N/V. Omeprazole for GERD. No dysphagia. No change in bowel habits. Good appetite.   Past Medical History  Diagnosis Date  . Valvular heart disease   . Obesity   . Osteoarthritis   . Hypertension   . Hyperlipidemia   . COPD (chronic obstructive pulmonary disease)   . CHF (congestive heart failure) diastolic 10/2010  . Pneumonia   . Impaired glucose tolerance   . Atrial fibrillation     Chronic  . Elevated prostate specific antigen (PSA)   . Cancer     Prostate  . Ulcer     internal-net placed and has been fixed  . Leg pain   . GERD (gastroesophageal reflux disease)   . Chronic kidney disease   . CAD (coronary artery disease)   . Hiatal hernia   . Hard of hearing   . Anemia   . Shortness of breath   . Diabetes mellitus     Past Surgical History  Procedure Date  . Left knee   . Ulcer surgery   . Bladder surgery   . Coronary artery bypass graft   . Rca with stent replacement      1994  . Left sfa stent 12-06-10    Stenting of left superficial femoral artery and popliteal artery  . Appendectomy   . Cardiac valve replacement 2003  . Eye surgery 2007    bilateral cataracts  . Tonsillectomy     thinks they were removed while in the [employer]  . Mass excision 06/07/2011    Procedure: EXCISION MASS;  Surgeon: Brent C Ziegler;  Location: [facility];  Service: General;  Laterality: N/A;  excision  of 2 masses back and buttocks  . Back surgery   . Esophagogastroduodenoscopy 03/03/2003    Large, deep prepyloric ulcer, as described above without bleeding/ Normal esophagus  . Esophagogastroduodenoscopy 09/03/2004    normal throughout  . Colonoscopy 09/03/2004    small ulcer, without stigmata of bleeding  . Esophagogastroduodenoscopy 11/14/2004    Normal esophagus and small hiatal hernia, otherwise normal stomach   . Colonoscopy 11/14/2004    Internal hemorrhoids, otherwise normal rectum/ left-sided diverticula , diffusely oozing right colon mucosa without a discrete lesion amenable to endoscopic therapy. 2 diminutive polyps. FELT TO HAVE AVMs/telangiectasias  . Colonoscopy 01/29/2005    Normal rectum    Current Outpatient Prescriptions  Medication Sig Dispense Refill  . ADVAIR DISKUS 100-50 MCG/DOSE AEPB INHALE 1 PUFF BY MOUTH TWICE DAILY.  60 each  2  . albuterol (PROVENTIL) (2.5 MG/3ML) 0.083% nebulizer solution USE 1 VIAL IN NEBULIZER EVERY 6 HOURS AS NEEDED.  300 vial  2  . albuterol (PROVENTIL) (5 MG/ML) 0.5% nebulizer solution Take 0.5 mLs (2.5 mg total) by nebulization 3 (three) times daily.  20 mL    . aspirin 81 MG EC tablet Take 81 mg by mouth daily.        .   furosemide (LASIX) 20 MG tablet Take 60 mg by mouth daily.      . HYDROcodone-acetaminophen (VICODIN) 5-500 MG per tablet Take 1 tablet by mouth every 6 (six) hours as needed.      . ipratropium (ATROVENT) 0.02 % nebulizer solution INHALE 1 VIAL VIA NEBULIZER EVERY 6 HOURS AS NEEDED FOR SHORTNESS OF BREATH.  250 mL  3  . lovastatin (MEVACOR) 40 MG tablet Take 40 mg by mouth at bedtime. Only takes on Sun, Mon, Wed, and Fri.      . metoprolol (LOPRESSOR) 50 MG tablet Take 25 mg by mouth 2 (two) times daily.      . multivitamin (THERAGRAN) per tablet Take 1 tablet by mouth daily.        . neomycin-polymyxin-hydrocortisone (CORTISPORIN) 3.5-10000-1 ophthalmic suspension Place 1 drop into both eyes daily as needed. Itchy eyes       . omeprazole (PRILOSEC) 40 MG capsule TAKE ONE CAPSULE DAILY.  30 capsule  3  . oxymetazoline (AFRIN) 0.05 % nasal spray Place 1 spray into the nose as needed.      . potassium chloride SA (K-DUR,KLOR-CON) 20 MEQ tablet Take 1 tablet (20 mEq total) by mouth 2 (two) times daily.  60 tablet  5  . clopidogrel (PLAVIX) 75 MG tablet Take 1 tablet (75 mg total) by mouth daily.  30 tablet  6    Allergies as of 02/13/2012 - Review Complete 02/13/2012  Allergen Reaction Noted  . Gabapentin  06/04/2011    Family History  Problem Relation Age of Onset  . Stroke Mother   . Stroke Brother   . Heart attack Brother   . Cancer Brother   . Cancer Brother   . Anesthesia problems Neg Hx   . Hypotension Neg Hx   . Malignant hyperthermia Neg Hx   . Pseudochol deficiency Neg Hx     History   Social History  . Marital Status: Widowed    Spouse Name: N/A    Number of Children: 4  . Years of Education: N/A   Occupational History  . Retired    Social History Main Topics  . Smoking status: Former Smoker -- 0.0 packs/day for 76 years    Types: Cigarettes    Quit date: 06/24/2010  . Smokeless tobacco: Not on file  . Alcohol Use: No  . Drug Use: No  . Sexually Active: Not Currently   Other Topics Concern  . Not on file   Social History Narrative   Has 3 children living.1 son deceased at age 15 due to drunk driving.    Review of Systems: Gen: Denies any fever, chills, fatigue, weight loss, lack of appetite.  CV: Denies chest pain, + heart palpitations, peripheral edema, syncope.  Resp: +DOE  GI: Denies dysphagia or odynophagia. Denies jaundice, hematemesis, fecal incontinence. GU : Denies urinary burning, urinary frequency, urinary hesitancy MS: + joint pain Derm: Denies rash, itching, dry skin Psych: Denies depression, anxiety, memory loss, and confusion Heme: Denies bruising, bleeding, and enlarged lymph nodes.  Physical Exam: BP 98/49  Pulse 72  Temp 97.6 F (36.4 C)  (Temporal)  Ht 6' 3" (1.905 m)  Wt 225 lb (102.059 kg)  BMI 28.12 kg/m2 General:   Alert and oriented. Pleasant and cooperative. Well-nourished and well-developed.  Head:  Normocephalic and atraumatic. Eyes:  Without icterus, sclera clear and conjunctiva pink.  Ears:  Normal auditory acuity. Nose:  No deformity, discharge,  or lesions. Mouth:  No deformity or lesions, oral mucosa pink.    Neck:  Supple, without mass or thyromegaly. Lungs:  Clear to auscultation bilaterally. No wheezes, rales, or rhonchi. No distress.  Heart:  S1, S2 present without murmurs appreciated.  Abdomen:  Limited exam as pt in wheelchair.  +BS, soft, non-tender and non-distended. No HSM noted. No guarding or rebound. No masses appreciated.  Rectal:  Deferred  Msk:  Symmetrical without gross deformities.  Extremities:  Without clubbing or edema. Neurologic:  Alert and  oriented x4;  grossly normal neurologically. Skin:  Intact without significant lesions or rashes. Cervical Nodes:  No significant cervical adenopathy. Psych:  Alert and cooperative. Normal mood and affect.    

## 2012-03-11 NOTE — Interval H&P Note (Signed)
History and Physical Interval Note:  03/11/2012 1:44 PM  [name]  has presented today for surgery, with the diagnosis of ANEMIA  The various methods of treatment have been discussed with the patient and family. After consideration of risks, benefits and other options for treatment, the patient has consented to  Procedure(s) (LRB) with comments: COLONOSCOPY (N/A) - 1:15 ESOPHAGOGASTRODUODENOSCOPY (EGD) (N/A) as a surgical intervention .  The patient's history has been reviewed, patient examined, no change in status, stable for surgery.  I have reviewed the patient's chart and labs.  Questions were answered to the patient's satisfaction.     Eula Listen

## 2012-03-11 NOTE — Op Note (Signed)
[facility] [address] [place], [address]   COLONOSCOPY PROCEDURE REPORT  PATIENT: [name], [name]  MR#:         [id number] BIRTHDATE: [birth year] , 89  yrs. old GENDER: Male ENDOSCOPIST: R.  Roetta Sessions, MD FACP [facility] REFERRED BY:  [name], M.D.  [name], M.D. PROCEDURE DATE:  03/11/2012 PROCEDURE:     colonoscopy with bleeding control therapy, snare polypectomy and biopsy INDICATIONS: refractory iron deficiency anemia  INFORMED CONSENT:  The risks, benefits, alternatives and imponderables including but not limited to bleeding, perforation as well as the possibility of a missed lesion have been reviewed.  The potential for biopsy, lesion removal, etc. have also been discussed.  Questions have been answered.  All parties agreeable. Please see the history and physical in the medical record for more information.  MEDICATIONS: Versed 3 mg IV and Demerol 37.5 mg IV in divided doses the  DESCRIPTION OF PROCEDURE:  After a digital rectal exam was performed, the EG-2990i ([id number]) and Pentax Colonoscope [id number] colonoscope was advanced from the anus through the rectum and colon to the area of the cecum, ileocecal valve and appendiceal orifice. The cecum was deeply intubated.  These structures were well-seen and photographed for the record.  From the level of the cecum and ileocecal valve, the scope was slowly and cautiously withdrawn. The mucosal surfaces were carefully surveyed utilizing scope tip deflection to facilitate fold flattening as needed.  The scope was pulled down into the rectum where a thorough examination including retroflexion was performed.    FINDINGS:  marginal to nearly an adequate preparation. Grossly normal rectum. Extensive left-sided transverse diverticula. Multiple cecal AVMs there is (1)-1 cm AVM that had a significant vascular impression underneath it much larger than the visible mucosal lesion suspicious for a  larger hemangioma. 2 smaller lesions -  friable and oozing spontaneously. The patient also 1 cm polyp in the mid ascending  segment and an adjacent diminutive polyp. No other lesions were seen, however,  the prep was relatively poor and a small flat lesion may have been obscured by the poor preparation today.  THERAPEUTIC / DIAGNOSTIC MANEUVERS PERFORMED:  the 1 cm polyp was hot snare removed. Diminutive polyps cold biopsy removed. 3 resolution clips were placed on the 2 smaller vascular anomalies in the cecum with good hemostasis maintained. The larger lesion with submucosal mass effect was not manipulated(SEE IMAGE 8).  COMPLICATIONS: none  CECAL WITHDRAWAL TIME:  19 minutes  IMPRESSION:  Colonic diverticulosis. Colonic polyps-removed as described above. Vascular anomalies in the cecum likely representing hemangiomas. Status post hemostasis clipping of  2 of the 3.  RECOMMENDATIONS:  Hemostasis clipping would not be considered definitive therapy for these vascular lesions. I feel he would benefit from mesenteric arterial embolization of these lesions in the near future. Would keep him off antiplatelet therapy for the time being. Followup on pathology. See EGD report.   _______________________________ eSigned:  R. Roetta Sessions, MD FACP [facility] 03/11/2012 2:43 PM   CC:    PATIENT NAME:  [name], [name] MR#: [id number]

## 2012-03-12 ENCOUNTER — Telehealth: Payer: Self-pay | Admitting: Internal Medicine

## 2012-03-12 NOTE — Telephone Encounter (Signed)
Waiting for pathology to send with referral

## 2012-03-12 NOTE — Telephone Encounter (Signed)
Naida Sleight (daughter of patient) called to see where we stand on making referral for her father. He has surgery yesterday and RMR and Dr Mariel Sleet had discussed sending him to Dr Earlie Counts. Daughter stays with patient and you can call her back at [phone]

## 2012-03-13 NOTE — Telephone Encounter (Signed)
Referral has been made to Dr. [name] they will contact the patient and set up date & Time.

## 2012-03-15 ENCOUNTER — Encounter: Payer: Self-pay | Admitting: Internal Medicine

## 2012-03-16 ENCOUNTER — Encounter (HOSPITAL_COMMUNITY): Payer: Self-pay | Admitting: Internal Medicine

## 2012-03-16 ENCOUNTER — Encounter: Payer: Self-pay | Admitting: *Deleted

## 2012-03-17 ENCOUNTER — Other Ambulatory Visit: Payer: Self-pay | Admitting: *Deleted

## 2012-03-17 ENCOUNTER — Other Ambulatory Visit: Payer: Self-pay | Admitting: Family Medicine

## 2012-03-17 DIAGNOSIS — Z48812 Encounter for surgical aftercare following surgery on the circulatory system: Secondary | ICD-10-CM

## 2012-03-17 DIAGNOSIS — I739 Peripheral vascular disease, unspecified: Secondary | ICD-10-CM

## 2012-03-20 ENCOUNTER — Encounter: Payer: Self-pay | Admitting: Surgery

## 2012-03-21 ENCOUNTER — Inpatient Hospital Stay (HOSPITAL_COMMUNITY)
Admission: EM | Admit: 2012-03-21 | Discharge: 2012-03-23 | DRG: 811 | Disposition: A | Discharge status: Home / Self Care | Payer: Medicare Other | Attending: Internal Medicine | Admitting: Internal Medicine

## 2012-03-21 ENCOUNTER — Inpatient Hospital Stay (HOSPITAL_COMMUNITY): Payer: Medicare Other

## 2012-03-21 ENCOUNTER — Encounter (HOSPITAL_COMMUNITY): Payer: Self-pay | Admitting: *Deleted

## 2012-03-21 DIAGNOSIS — Z79899 Other long term (current) drug therapy: Secondary | ICD-10-CM

## 2012-03-21 DIAGNOSIS — I5043 Acute on chronic combined systolic (congestive) and diastolic (congestive) heart failure: Secondary | ICD-10-CM | POA: Diagnosis present

## 2012-03-21 DIAGNOSIS — K922 Gastrointestinal hemorrhage, unspecified: Secondary | ICD-10-CM | POA: Diagnosis present

## 2012-03-21 DIAGNOSIS — Z9861 Coronary angioplasty status: Secondary | ICD-10-CM

## 2012-03-21 DIAGNOSIS — Z951 Presence of aortocoronary bypass graft: Secondary | ICD-10-CM

## 2012-03-21 DIAGNOSIS — I4891 Unspecified atrial fibrillation: Secondary | ICD-10-CM | POA: Diagnosis present

## 2012-03-21 DIAGNOSIS — D500 Iron deficiency anemia secondary to blood loss (chronic): Secondary | ICD-10-CM | POA: Diagnosis present

## 2012-03-21 DIAGNOSIS — K5521 Angiodysplasia of colon with hemorrhage: Secondary | ICD-10-CM | POA: Diagnosis present

## 2012-03-21 DIAGNOSIS — D62 Acute posthemorrhagic anemia: Principal | ICD-10-CM | POA: Diagnosis present

## 2012-03-21 DIAGNOSIS — I509 Heart failure, unspecified: Secondary | ICD-10-CM | POA: Diagnosis present

## 2012-03-21 DIAGNOSIS — Z860100 Personal history of colon polyps, unspecified: Secondary | ICD-10-CM

## 2012-03-21 DIAGNOSIS — Z8546 Personal history of malignant neoplasm of prostate: Secondary | ICD-10-CM

## 2012-03-21 DIAGNOSIS — J9610 Chronic respiratory failure, unspecified whether with hypoxia or hypercapnia: Secondary | ICD-10-CM | POA: Diagnosis present

## 2012-03-21 DIAGNOSIS — I959 Hypotension, unspecified: Secondary | ICD-10-CM | POA: Diagnosis present

## 2012-03-21 DIAGNOSIS — I2510 Atherosclerotic heart disease of native coronary artery without angina pectoris: Secondary | ICD-10-CM | POA: Diagnosis present

## 2012-03-21 DIAGNOSIS — I739 Peripheral vascular disease, unspecified: Secondary | ICD-10-CM | POA: Diagnosis present

## 2012-03-21 DIAGNOSIS — J441 Chronic obstructive pulmonary disease with (acute) exacerbation: Secondary | ICD-10-CM | POA: Diagnosis present

## 2012-03-21 DIAGNOSIS — Z8719 Personal history of other diseases of the digestive system: Secondary | ICD-10-CM

## 2012-03-21 DIAGNOSIS — Z8701 Personal history of pneumonia (recurrent): Secondary | ICD-10-CM

## 2012-03-21 DIAGNOSIS — Z9089 Acquired absence of other organs: Secondary | ICD-10-CM

## 2012-03-21 DIAGNOSIS — I129 Hypertensive chronic kidney disease with stage 1 through stage 4 chronic kidney disease, or unspecified chronic kidney disease: Secondary | ICD-10-CM | POA: Diagnosis present

## 2012-03-21 DIAGNOSIS — K219 Gastro-esophageal reflux disease without esophagitis: Secondary | ICD-10-CM | POA: Diagnosis present

## 2012-03-21 DIAGNOSIS — J4489 Other specified chronic obstructive pulmonary disease: Secondary | ICD-10-CM | POA: Diagnosis present

## 2012-03-21 DIAGNOSIS — Z954 Presence of other heart-valve replacement: Secondary | ICD-10-CM

## 2012-03-21 DIAGNOSIS — Z7982 Long term (current) use of aspirin: Secondary | ICD-10-CM

## 2012-03-21 DIAGNOSIS — J449 Chronic obstructive pulmonary disease, unspecified: Secondary | ICD-10-CM

## 2012-03-21 DIAGNOSIS — K625 Hemorrhage of anus and rectum: Secondary | ICD-10-CM

## 2012-03-21 DIAGNOSIS — M1990 Unspecified osteoarthritis, unspecified site: Secondary | ICD-10-CM | POA: Diagnosis present

## 2012-03-21 DIAGNOSIS — Z8249 Family history of ischemic heart disease and other diseases of the circulatory system: Secondary | ICD-10-CM

## 2012-03-21 DIAGNOSIS — E669 Obesity, unspecified: Secondary | ICD-10-CM | POA: Diagnosis present

## 2012-03-21 DIAGNOSIS — H9190 Unspecified hearing loss, unspecified ear: Secondary | ICD-10-CM | POA: Diagnosis present

## 2012-03-21 DIAGNOSIS — N189 Chronic kidney disease, unspecified: Secondary | ICD-10-CM | POA: Diagnosis present

## 2012-03-21 DIAGNOSIS — E785 Hyperlipidemia, unspecified: Secondary | ICD-10-CM | POA: Diagnosis present

## 2012-03-21 DIAGNOSIS — Z87891 Personal history of nicotine dependence: Secondary | ICD-10-CM

## 2012-03-21 DIAGNOSIS — Z888 Allergy status to other drugs, medicaments and biological substances status: Secondary | ICD-10-CM

## 2012-03-21 DIAGNOSIS — I5032 Chronic diastolic (congestive) heart failure: Secondary | ICD-10-CM | POA: Diagnosis present

## 2012-03-21 DIAGNOSIS — Z9981 Dependence on supplemental oxygen: Secondary | ICD-10-CM

## 2012-03-21 DIAGNOSIS — E119 Type 2 diabetes mellitus without complications: Secondary | ICD-10-CM | POA: Diagnosis present

## 2012-03-21 LAB — CBC WITH DIFFERENTIAL/PLATELET
Basophils Absolute: 0 10*3/uL (ref 0.0–0.1)
HCT: 28.6 % — ABNORMAL LOW (ref 39.0–52.0)
Lymphs Abs: 0.9 10*3/uL (ref 0.7–4.0)
MCH: 29.5 pg (ref 26.0–34.0)
MCV: 93.8 fL (ref 78.0–100.0)
Monocytes Absolute: 1.7 10*3/uL — ABNORMAL HIGH (ref 0.1–1.0)
Monocytes Relative: 12 % (ref 3–12)
Neutro Abs: 11.6 10*3/uL — ABNORMAL HIGH (ref 1.7–7.7)
Platelets: 194 10*3/uL (ref 150–400)
RDW: 19.4 % — ABNORMAL HIGH (ref 11.5–15.5)
WBC: 14.3 10*3/uL — ABNORMAL HIGH (ref 4.0–10.5)

## 2012-03-21 LAB — COMPREHENSIVE METABOLIC PANEL
ALT: 17 U/L (ref 0–53)
AST: 19 U/L (ref 0–37)
Albumin: 3.4 g/dL — ABNORMAL LOW (ref 3.5–5.2)
Alkaline Phosphatase: 66 U/L (ref 39–117)
Chloride: 102 mEq/L (ref 96–112)
Potassium: 3.6 mEq/L (ref 3.5–5.1)
Sodium: 138 mEq/L (ref 135–145)
Total Protein: 6.3 g/dL (ref 6.0–8.3)

## 2012-03-21 LAB — MRSA PCR SCREENING: MRSA by PCR: NEGATIVE

## 2012-03-21 LAB — HEMOGLOBIN AND HEMATOCRIT, BLOOD: Hemoglobin: 8.6 g/dL — ABNORMAL LOW (ref 13.0–17.0)

## 2012-03-21 LAB — APTT: aPTT: 37 seconds (ref 24–37)

## 2012-03-21 MED ORDER — SODIUM CHLORIDE 0.9 % IV SOLN
80.0000 mg | Freq: Once | INTRAVENOUS | Status: DC
  Filled 2012-03-21: qty 80

## 2012-03-21 MED ORDER — IPRATROPIUM BROMIDE 0.02 % IN SOLN
500.0000 ug | Freq: Two times a day (BID) | RESPIRATORY_TRACT | Status: DC
  Administered 2012-03-21: 500 ug via RESPIRATORY_TRACT
  Filled 2012-03-21: qty 2.5

## 2012-03-21 MED ORDER — IPRATROPIUM BROMIDE 0.02 % IN SOLN
500.0000 ug | Freq: Two times a day (BID) | RESPIRATORY_TRACT | Status: DC
  Administered 2012-03-22 – 2012-03-23 (×3): 500 ug via RESPIRATORY_TRACT
  Filled 2012-03-21 (×3): qty 2.5

## 2012-03-21 MED ORDER — PANTOPRAZOLE SODIUM 40 MG PO TBEC
40.0000 mg | DELAYED_RELEASE_TABLET | Freq: Every day | ORAL | Status: DC
  Administered 2012-03-22 – 2012-03-23 (×2): 40 mg via ORAL
  Filled 2012-03-21 (×2): qty 1

## 2012-03-21 MED ORDER — ACETAMINOPHEN 325 MG PO TABS: 650.0000 mg | ORAL_TABLET | Freq: Four times a day (QID) | ORAL | Status: DC | PRN

## 2012-03-21 MED ORDER — ACETAMINOPHEN 650 MG RE SUPP: 650.0000 mg | Freq: Four times a day (QID) | RECTAL | Status: DC | PRN

## 2012-03-21 MED ORDER — ALBUTEROL SULFATE (5 MG/ML) 0.5% IN NEBU
2.5000 mg | INHALATION_SOLUTION | Freq: Four times a day (QID) | RESPIRATORY_TRACT | Status: DC | PRN
  Administered 2012-03-21: 2.5 mg via RESPIRATORY_TRACT
  Filled 2012-03-21: qty 0.5

## 2012-03-21 MED ORDER — TECHNETIUM TC 99M-LABELED RED BLOOD CELLS IV KIT
25.0000 | PACK | Freq: Once | INTRAVENOUS | Status: AC | PRN
  Administered 2012-03-21: 26 via INTRAVENOUS

## 2012-03-21 MED ORDER — PANTOPRAZOLE SODIUM 40 MG PO TBEC
40.0000 mg | DELAYED_RELEASE_TABLET | Freq: Once | ORAL | Status: AC
  Administered 2012-03-21: 40 mg via ORAL
  Filled 2012-03-21: qty 1

## 2012-03-21 MED ORDER — FLUTICASONE-SALMETEROL 100-50 MCG/DOSE IN AEPB
INHALATION_SPRAY | RESPIRATORY_TRACT | Status: AC
  Filled 2012-03-21: qty 14

## 2012-03-21 MED ORDER — ONDANSETRON HCL 4 MG/2ML IJ SOLN: 4.0000 mg | Freq: Four times a day (QID) | INTRAMUSCULAR | Status: DC | PRN

## 2012-03-21 MED ORDER — IPRATROPIUM BROMIDE 0.02 % IN SOLN: 0.5000 mg | Freq: Four times a day (QID) | RESPIRATORY_TRACT | Status: DC | PRN

## 2012-03-21 MED ORDER — ONDANSETRON HCL 4 MG PO TABS: 4.0000 mg | ORAL_TABLET | Freq: Four times a day (QID) | ORAL | Status: DC | PRN

## 2012-03-21 MED ORDER — ALBUTEROL SULFATE (5 MG/ML) 0.5% IN NEBU
2.5000 mg | INHALATION_SOLUTION | Freq: Two times a day (BID) | RESPIRATORY_TRACT | Status: DC
  Administered 2012-03-22 – 2012-03-23 (×3): 2.5 mg via RESPIRATORY_TRACT
  Filled 2012-03-21 (×3): qty 0.5

## 2012-03-21 MED ORDER — FLUTICASONE-SALMETEROL 100-50 MCG/DOSE IN AEPB
1.0000 | INHALATION_SPRAY | Freq: Two times a day (BID) | RESPIRATORY_TRACT | Status: DC
  Administered 2012-03-21 – 2012-03-23 (×4): 1 via RESPIRATORY_TRACT
  Filled 2012-03-21: qty 14

## 2012-03-21 NOTE — ED Notes (Signed)
Patient returned from Nuclear Medicine - to room 19.  Family member here with patient.  Transferred via stretcher to step down unit.

## 2012-03-21 NOTE — ED Provider Notes (Addendum)
History   This chart was scribed for Hilario Quarry, MD, by Frederik Pear. The patient was seen in room APA19/APA19 and the patient's care was started at 1549.    CSN: [id number]  Arrival date & time 03/21/12  1537   First MD Initiated Contact with Patient 03/21/12 1549      Chief Complaint  Patient presents with  . Rectal Bleeding    (Consider location/radiation/quality/duration/timing/severity/associated sxs/prior treatment) HPI Comments: [name] is a 78 y.o. male who presents to the Emergency Department complaining of moderate rectal bleeding that began 4 hours PTA.  Pt also reports associated diarrhea that began earlier this week. Pt saw Dr. Jena Gauss 3 days ago for a colonospy. Pt denies fever and dysuria. Pt has no h/o previous surgeries. Pt is ambulant and does not use a cane.      PCP is Dr. Lodema Hong      Patient denies abdominal pain and denies being lightheaded although generally weak.  He denies history of significant rectal bleeding although he states he had the colonoscopy due to anemia.  He was taking plavix and aspirin but is currently on aspirin only.  He denies chest pain , shortness of breath, syncope, or ulcer disease.   Past Medical History  Diagnosis Date  . Valvular heart disease   . Obesity   . Osteoarthritis   . Hypertension   . Hyperlipidemia   . COPD (chronic obstructive pulmonary disease)   . CHF (congestive heart failure) diastolic 10/2010  . Pneumonia   . Impaired glucose tolerance   . Atrial fibrillation     Chronic  . Elevated prostate specific antigen (PSA)   . Cancer     Prostate  . Ulcer     internal-net placed and has been fixed  . Leg pain   . GERD (gastroesophageal reflux disease)   . Chronic kidney disease   . CAD (coronary artery disease)   . Hiatal hernia   . Hard of hearing   . Anemia   . Shortness of breath   . Diabetes mellitus     Past Surgical History  Procedure Date  . Left knee   . Ulcer surgery   .  Bladder surgery   . Coronary artery bypass graft   . Rca with stent replacement      1994  . Left sfa stent 12-06-10    Stenting of left superficial femoral artery and popliteal artery  . Appendectomy   . Cardiac valve replacement 2003  . Eye surgery 2007    bilateral cataracts  . Tonsillectomy     thinks they were removed while in the [employer]  . Mass excision 06/07/2011    Procedure: EXCISION MASS;  Surgeon: Fabio Bering;  Location: [facility];  Service: General;  Laterality: N/A;  excision of 2 masses back and buttocks  . Back surgery   . Esophagogastroduodenoscopy 03/03/2003    Large, deep prepyloric ulcer, as described above without bleeding/ Normal esophagus  . Esophagogastroduodenoscopy 09/03/2004    normal throughout  . Colonoscopy 09/03/2004    small ulcer, without stigmata of bleeding  . Esophagogastroduodenoscopy 11/14/2004    Normal esophagus and small hiatal hernia, otherwise normal stomach   . Colonoscopy 11/14/2004    Internal hemorrhoids, otherwise normal rectum/ left-sided diverticula , diffusely oozing right colon mucosa without a discrete lesion amenable to endoscopic therapy. 2 diminutive polyps. FELT TO HAVE AVMs/telangiectasias  . Colonoscopy 01/29/2005    Normal rectum  . Colonoscopy 03/11/2012  Procedure: COLONOSCOPY;  Surgeon: Corbin Ade, MD;  Location: [facility];  Service: Endoscopy;  Laterality: N/A;  1:15  . Esophagogastroduodenoscopy 03/11/2012    Procedure: ESOPHAGOGASTRODUODENOSCOPY (EGD);  Surgeon: Corbin Ade, MD;  Location: [facility];  Service: Endoscopy;  Laterality: N/A;    Family History  Problem Relation Age of Onset  . Stroke Mother   . Stroke Brother   . Heart attack Brother   . Cancer Brother   . Cancer Brother   . Anesthesia problems Neg Hx   . Hypotension Neg Hx   . Malignant hyperthermia Neg Hx   . Pseudochol deficiency Neg Hx     History  Substance Use Topics  . Smoking status: Former Smoker -- 0.0 packs/day for 76  years    Types: Cigarettes    Quit date: 06/24/2010  . Smokeless tobacco: Not on file  . Alcohol Use: No      Review of Systems  Gastrointestinal: Positive for anal bleeding.  All other systems reviewed and are negative.    Allergies  Gabapentin  Home Medications   Current Outpatient Rx  Name Route Sig Dispense Refill  . ALBUTEROL SULFATE (2.5 MG/3ML) 0.083% IN NEBU Nebulization Take 2.5 mg by nebulization 2 (two) times daily. Mixes with the Ipratropium    . ASPIRIN 81 MG PO TBEC Oral Take 81 mg by mouth daily.      . OCUVITE PO TABS Oral Take 1 tablet by mouth daily.    Marland Kitchen FLUTICASONE-SALMETEROL 100-50 MCG/DOSE IN AEPB Inhalation Inhale 1 puff into the lungs every 12 (twelve) hours.    Marland Kitchen HYDROCODONE-ACETAMINOPHEN 5-500 MG PO TABS Oral Take 1 tablet by mouth every 6 (six) hours as needed. Pain    . IPRATROPIUM BROMIDE 0.02 % IN SOLN Nebulization Take 500 mcg by nebulization 2 (two) times daily. Mixes with the Albuterol    . LOVASTATIN 40 MG PO TABS Oral Take 40 mg by mouth at bedtime. Only takes on Sun, Mon, Wed, and Fri.    . METOPROLOL TARTRATE 50 MG PO TABS Oral Take 25 mg by mouth 2 (two) times daily.    . CENTRUM SILVER PO Oral Take 1 tablet by mouth daily.    Marland Kitchen OMEPRAZOLE 40 MG PO CPDR Oral Take 40 mg by mouth daily.    Marland Kitchen OXYMETAZOLINE HCL 0.05 % NA SOLN Nasal Place 1 spray into the nose daily as needed. Congestion    . POTASSIUM CHLORIDE CRYS ER 20 MEQ PO TBCR Oral Take 1 tablet (20 mEq total) by mouth 2 (two) times daily. 60 tablet 5    BP 120/70  Temp 98.5 F (36.9 C) (Oral)  Resp 20  Ht 6\' 3"  (1.905 m)  Wt 225 lb (102.059 kg)  BMI 28.12 kg/m2  SpO2 91%  Physical Exam  Nursing note and vitals reviewed. Constitutional: He is oriented to person, place, and time. He appears well-developed and well-nourished.  HENT:  Head: Normocephalic and atraumatic.  Eyes: EOM are normal. Pupils are equal, round, and reactive to light. No scleral icterus.       Conjunctiva  are pale.  Neck: Neck supple. No thyromegaly present.  Cardiovascular: Normal rate and regular rhythm.  Exam reveals no gallop and no friction rub.   No murmur heard. Pulmonary/Chest: Effort normal and breath sounds normal. No stridor. He has no wheezes. He has no rales. He exhibits no tenderness.       Ecchymosis to rt chest wall.  Abdominal: He exhibits no distension. There  is tenderness. There is no rebound.       Tenderness on rt side of abdomen.  Genitourinary: Penis normal.       Gross blood colored material around rectum.  Musculoskeletal: Normal range of motion. He exhibits no edema.  Lymphadenopathy:    He has no cervical adenopathy.  Neurological: He is alert and oriented to person, place, and time. Coordination normal.  Skin: No rash noted. No erythema.  Psychiatric: He has a normal mood and affect. His behavior is normal. Judgment and thought content normal.    ED Course  Procedures (including critical care time)  DIAGNOSTIC STUDIES: Oxygen Saturation is 91% on room air, adequate by my interpretation.    COORDINATION OF CARE:  14:01- Discussed planned course of treatment with the patient, including blood work, who is agreeable at this time.      Labs Reviewed  CBC WITH DIFFERENTIAL  BASIC METABOLIC PANEL  TYPE AND SCREEN   No results found.   No diagnosis found.   Results for orders placed during the hospital encounter of 03/21/12  CBC WITH DIFFERENTIAL      Component Value Range   WBC 14.3 (*) 4.0 - 10.5 K/uL   RBC 3.05 (*) 4.22 - 5.81 MIL/uL   Hemoglobin 9.0 (*) 13.0 - 17.0 g/dL   HCT 16.1 (*) 09.6 - 04.5 %   MCV 93.8  78.0 - 100.0 fL   MCH 29.5  26.0 - 34.0 pg   MCHC 31.5  30.0 - 36.0 g/dL   RDW 40.9 (*) 81.1 - 91.4 %   Platelets 194  150 - 400 K/uL   Neutrophils Relative 81 (*) 43 - 77 %   Lymphocytes Relative 6 (*) 12 - 46 %   Monocytes Relative 12  3 - 12 %   Eosinophils Relative 1  0 - 5 %   Basophils Relative 0  0 - 1 %   Neutro Abs 11.6  (*) 1.7 - 7.7 K/uL   Lymphs Abs 0.9  0.7 - 4.0 K/uL   Monocytes Absolute 1.7 (*) 0.1 - 1.0 K/uL   Eosinophils Absolute 0.1  0.0 - 0.7 K/uL   Basophils Absolute 0.0  0.0 - 0.1 K/uL   RBC Morphology TEARDROP CELLS     WBC Morphology ATYPICAL LYMPHOCYTES    COMPREHENSIVE METABOLIC PANEL      Component Value Range   Sodium 138  135 - 145 mEq/L   Potassium 3.6  3.5 - 5.1 mEq/L   Chloride 102  96 - 112 mEq/L   CO2 27  19 - 32 mEq/L   Glucose, Bld 124 (*) 70 - 99 mg/dL   BUN 22  6 - 23 mg/dL   Creatinine, Ser 7.82  0.50 - 1.35 mg/dL   Calcium 8.7  8.4 - 95.6 mg/dL   Total Protein 6.3  6.0 - 8.3 g/dL   Albumin 3.4 (*) 3.5 - 5.2 g/dL   AST 19  0 - 37 U/L   ALT 17  0 - 53 U/L   Alkaline Phosphatase 66  39 - 117 U/L   Total Bilirubin 0.5  0.3 - 1.2 mg/dL   GFR calc non Af Amer 60 (*) >90 mL/min   GFR calc Af Amer 69 (*) >90 mL/min  APTT      Component Value Range   aPTT 37  24 - 37 seconds  PROTIME-INR      Component Value Range   Prothrombin Time 15.6 (*) 11.6 - 15.2 seconds   INR  1.27  0.00 - 1.49    MDM  Patient had colonoscopy on 9/18 by Dr. Kendell Bane and had polypectomy and report states one poly hot snare removed , 2 small cold bx removed, and 3 resolution clips place on small vascular anomalies.  Patient anemic but was previously anemic with last hemoglobin 9.6.  Patient pale but hr and bp normal here.  He has been typed and screened and gi is paged.    Patient care discussed with DRs. Fields and [employer] and patient will be placed in step down bed.   CRITICAL CARE Performed by: Hilario Quarry   Total critical care time: 45  Critical care time was exclusive of separately billable procedures and treating other patients.  Critical care was necessary to treat or prevent imminent or life-threatening deterioration.  Critical care was time spent personally by me on the following activities: development of treatment plan with patient and/or surrogate as well as nursing, discussions  with consultants, evaluation of patient's response to treatment, examination of patient, obtaining history from patient or surrogate, ordering and performing treatments and interventions, ordering and review of laboratory studies, ordering and review of radiographic studies, pulse oximetry and re-evaluation of patient's condition.    Patient with bp 100/60 and normally hypertensive.  Gjiven that patient has active bleeding today, will transfuse 2 units prbc  5:57 PM   Hilario Quarry, MD 03/21/12 1610  Hilario Quarry, MD 03/21/12 1757

## 2012-03-21 NOTE — ED Notes (Signed)
Reports rectal bleeding that started around 1200 today.  Denies n/v.  Reports had colonoscopy x 3 days ago and found polyps.

## 2012-03-21 NOTE — Progress Notes (Deleted)
[facility] History and Physical  INFANT ZINK WUJ:[id number] DOB: February 14, 1922 DOA: 03/21/2012  Referring physician: Dr. Rosalia Hammers PCP: [name], MD   Chief Complaint: rectal bleeding  HPI: [name] is a 78 y.o. male is a gentleman with multiple medical problems who presents to the emergency room with rectal bleeding. Apparently the patient's symptoms started last night when he started to have diarrhea. It was noted that he had grossly bloody stools, with fresh blood and clots. His family reports that he is increasingly pale. He feels generally weak. Denies any dizziness, chest pain, worsening shortness of breath. He's not had any vomiting or abdominal pain. Patient recently underwent upper endoscopy and colonoscopy by Dr. Jena Gauss. Endoscopy did not reveal any active sources of bleeding. Colonoscopy did reveal some AVMs which were clipped, polyps that were removed and diverticulosis. It was mentioned that if patient had any further bleeding then he may require  mesenteric embolization. He was undergoing colonoscopy and endoscopy for long-standing history of iron deficiency anemia. The patient was evaluated in the emergency room today and was found to have a mild decrease in hemoglobin to 9.0. It appears less hemoglobin was in the 9.6 range. His blood pressure was noted to be somewhat on the lower side in the low 100s and he appears to be very pale. Case was discussed with Dr. Darrick Penna and inpatient admission for further workup was recommended. The remainder of his chronic medical issues have remained relatively stable.  Review of Systems: The patient denies anorexia, fever, weight loss,, vision loss, decreased hearing, hoarseness, chest pain, syncope, dyspnea on exertion, peripheral edema, balance deficits, hemoptysis, abdominal pain, severe indigestion/heartburn, hematuria, incontinence, genital sores, muscle weakness, suspicious skin lesions, transient blindness, difficulty walking,  depression, unusual weight change, abnormal bleeding, enlarged lymph nodes, angioedema, and breast masses.    Past Medical History  Diagnosis Date  . Valvular heart disease   . Obesity   . Osteoarthritis   . Hypertension   . Hyperlipidemia   . COPD (chronic obstructive pulmonary disease)   . CHF (congestive heart failure) diastolic 10/2010  . Pneumonia   . Impaired glucose tolerance   . Atrial fibrillation     Chronic  . Elevated prostate specific antigen (PSA)   . Cancer     Prostate  . Ulcer     internal-net placed and has been fixed  . Leg pain   . GERD (gastroesophageal reflux disease)   . Chronic kidney disease   . CAD (coronary artery disease)   . Hiatal hernia   . Hard of hearing   . Anemia   . Shortness of breath   . Diabetes mellitus    Past Surgical History  Procedure Date  . Left knee   . Ulcer surgery   . Bladder surgery   . Coronary artery bypass graft   . Rca with stent replacement      1994  . Left sfa stent 12-06-10    Stenting of left superficial femoral artery and popliteal artery  . Appendectomy   . Cardiac valve replacement 2003  . Eye surgery 2007    bilateral cataracts  . Tonsillectomy     thinks they were removed while in the [employer]  . Mass excision 06/07/2011    Procedure: EXCISION MASS;  Surgeon: Fabio Bering;  Location: [facility];  Service: General;  Laterality: N/A;  excision of 2 masses back and buttocks  . Back surgery   . Esophagogastroduodenoscopy 03/03/2003    Large, deep prepyloric  ulcer, as described above without bleeding/ Normal esophagus  . Esophagogastroduodenoscopy 09/03/2004    normal throughout  . Colonoscopy 09/03/2004    small ulcer, without stigmata of bleeding  . Esophagogastroduodenoscopy 11/14/2004    Normal esophagus and small hiatal hernia, otherwise normal stomach   . Colonoscopy 11/14/2004    Internal hemorrhoids, otherwise normal rectum/ left-sided diverticula , diffusely oozing right colon mucosa without a  discrete lesion amenable to endoscopic therapy. 2 diminutive polyps. FELT TO HAVE AVMs/telangiectasias  . Colonoscopy 01/29/2005    Normal rectum  . Colonoscopy 03/11/2012    Procedure: COLONOSCOPY;  Surgeon: Corbin Ade, MD;  Location: [facility];  Service: Endoscopy;  Laterality: N/A;  1:15  . Esophagogastroduodenoscopy 03/11/2012    Procedure: ESOPHAGOGASTRODUODENOSCOPY (EGD);  Surgeon: Corbin Ade, MD;  Location: [facility];  Service: Endoscopy;  Laterality: N/A;   Social History:  reports that he quit smoking about 20 months ago. His smoking use included Cigarettes. He smoked 0 packs per day for 78 years. He does not have any smokeless tobacco history on file. He reports that he does not drink alcohol or use illicit drugs.   Allergies  Allergen Reactions  . Gabapentin     Patient cannot take over 200 mg dose at a time as it causes jerking or spasms    Family History  Problem Relation Age of Onset  . Stroke Mother   . Stroke Brother   . Heart attack Brother   . Cancer Brother   . Cancer Brother   . Anesthesia problems Neg Hx   . Hypotension Neg Hx   . Malignant hyperthermia Neg Hx   . Pseudochol deficiency Neg Hx      Prior to Admission medications   Medication Sig Start Date End Date Taking? Authorizing Provider  albuterol (PROVENTIL) (2.5 MG/3ML) 0.083% nebulizer solution Take 2.5 mg by nebulization every 6 (six) hours as needed. Mixes with the Ipratropium(for shortness of breath)   Yes Historical Provider, MD  aspirin 81 MG EC tablet Take 81 mg by mouth daily.     Yes Historical Provider, MD  beta carotene w/minerals (OCUVITE) tablet Take 1 tablet by mouth daily.   Yes Historical Provider, MD  Fluticasone-Salmeterol (ADVAIR) 100-50 MCG/DOSE AEPB Inhale 1 puff into the lungs every 12 (twelve) hours.   Yes Historical Provider, MD  furosemide (LASIX) 20 MG tablet Take 60 mg by mouth daily.   Yes Historical Provider, MD  ipratropium (ATROVENT) 0.02 % nebulizer  solution Take 500 mcg by nebulization 2 (two) times daily. Mixes with the Albuterol   Yes Historical Provider, MD  lovastatin (MEVACOR) 40 MG tablet Take 40 mg by mouth at bedtime. Only takes on Sun, Mon, Wed, and Fri.   Yes Historical Provider, MD  metoprolol (LOPRESSOR) 50 MG tablet Take 25 mg by mouth 2 (two) times daily. 03/07/11  Yes Kerri Perches, MD  Multiple Vitamins-Minerals (CENTRUM SILVER PO) Take 1 tablet by mouth daily.   Yes Historical Provider, MD  omeprazole (PRILOSEC) 40 MG capsule Take 40 mg by mouth daily.   Yes Historical Provider, MD  oxymetazoline (AFRIN) 0.05 % nasal spray Place 1 spray into the nose daily as needed. Congestion   Yes Historical Provider, MD  potassium chloride SA (K-DUR,KLOR-CON) 20 MEQ tablet Take 1 tablet (20 mEq total) by mouth 2 (two) times daily. 09/03/11 09/02/12 Yes Kerri Perches, MD  HYDROcodone-acetaminophen (VICODIN) 5-500 MG per tablet Take 1 tablet by mouth every 6 (six) hours as needed. Pain  Historical Provider, MD   Physical Exam: Filed Vitals:   03/21/12 1539 03/21/12 1700  BP: 120/70 100/62  Pulse:  85  Temp: 98.5 F (36.9 C)   TempSrc: Oral   Resp: 20 24  Height: 6\' 3"  (1.905 m)   Weight: 102.059 kg (225 lb)   SpO2: 91% 95%     General:  Appears to be chronically ill, does not have any signs of distress, is able to participate in history  Eyes: Pupils are equal round reactive to light  ENT: No pharyngeal erythema or exudates  Neck: Supple  Cardiovascular: S1, S2, regular rate and rhythm with occasional PVC  Respiratory: Mild diffuse expiratory wheeze  Abdomen: Soft, tenderness appears to be in the right lower quadrant, no rigidity or guarding, bowel sounds are active  Skin: Deferred  Musculoskeletal: Deferred  Psychiatric: Normal affect, cooperative with exam  Neurologic: Grossly intact, nonfocal  Labs on Admission:  Basic Metabolic Panel:  Lab 03/21/12 6295  NA 138  K 3.6  CL 102  CO2 27  GLUCOSE  124*  BUN 22  CREATININE 1.07  CALCIUM 8.7  MG --  PHOS --   Liver Function Tests:  Lab 03/21/12 1558  AST 19  ALT 17  ALKPHOS 66  BILITOT 0.5  PROT 6.3  ALBUMIN 3.4*   No results found for this basename: LIPASE:5,AMYLASE:5 in the last 168 hours No results found for this basename: AMMONIA:5 in the last 168 hours CBC:  Lab 03/21/12 1545  WBC 14.3*  NEUTROABS 11.6*  HGB 9.0*  HCT 28.6*  MCV 93.8  PLT 194   Cardiac Enzymes: No results found for this basename: CKTOTAL:5,CKMB:5,CKMBINDEX:5,TROPONINI:5 in the last 168 hours  BNP (last 3 results)  Basename 08/06/11 1030  PROBNP 2781.0*   CBG: No results found for this basename: GLUCAP:5 in the last 168 hours  Radiological Exams on Admission: No results found.   Assessment/Plan Principal Problem:  *GI bleeding Active Problems:  IRON DEFIC ANEMIA SEC DIET IRON INTAKE  Peripheral vascular disease  COPD (chronic obstructive pulmonary disease)  Chronic respiratory failure  Chronic diastolic CHF (congestive heart failure)   1. GI bleeding. Case was discussed with Dr. Darrick Penna who recommended inpatient workup. The patient has had a colonoscopy and endoscopy done recently. Recommendations were for a tagged RBC scan. This is already been ordered. Depending on these results, patient may need further mesenteric embolization and require transfer to [facility]. This is more of a diverticular bleed, then care will be supportive. He'll be kept n.p.o. for now 2. Iron deficiency anemia with likely acute blood loss component. Patient has been ordered 2 units of PRBCs due to the degree of his bleeding and relative hypotension. He will be monitored in the step down unit for now. 3. COPD. Mild wheezing. Patient is on home oxygen. Continue outpatient medication regimen 4. Chronic diastolic congestive heart failure. Patient may need Lasix after the blood transfusion. We will monitor his volume status. 5. Peripheral vascular disease.  Patient had stents put in his lower extremity approximately a year ago. We will have to hold aspirin and Plavix for now due to ongoing bleeding. 6. History of hypertension. Currently we will have to put antihypertensives on hold due to relative hypotension and ongoing bleeding. These can be restarted as felt appropriate.  Code Status: Full code. This was confirmed with the patient's family at the bedside Family Communication: Discussed with family at the bedside Disposition Plan: Depending on hospital course  Time spent: 60 minutes  MEMON,JEHANZEB [facility] Pager [phone]  If 7PM-7AM, please contact night-coverage [email or web address] Password [facility] 03/21/2012, 6:35 PM

## 2012-03-21 NOTE — Consult Note (Signed)
Referring Provider: No ref. provider found Primary Care Physician:  [name], MD Primary Gastroenterologist:  Jonette Eva  Reason for Consultation:  RECTAL BLEEDING  HPI:  PT SEEN AND EVALUATED SEP 2013 FOR ANEMIA. TCS/EGD PERFORMED 9/18-2 CLIPS APPLIED. RECEIVING IVFE FROM DR. NEIJSTROM. PT WAS STILL TAKING ASA/PLAVIX BUT NO PLAVIX SINCE 9/18. LAST ASA 9/28. PT REFERRED TO VASCULAR SURGERY FOR PROBABLE MESENTERIC EMBOLIZATION. PT HAD AN APPT WITH VASCULAR 9/30 AT 0800. PT PRESENTS WITH BRBPR ON FRI NIGHT. PT IS HDS, WITH HB 9.0. LAST BLEEDING IN HOSPITAL. NOT BEEN UP AT ALL. GOT IN ROOM ABOUT 11 PM.  Past Medical History  Diagnosis Date  . Valvular heart disease   . Obesity   . Osteoarthritis   . Hypertension   . Hyperlipidemia   . COPD (chronic obstructive pulmonary disease)   . CHF (congestive heart failure) diastolic 10/2010  . Pneumonia   . Impaired glucose tolerance   . Atrial fibrillation     Chronic  . Elevated prostate specific antigen (PSA)   . Cancer     Prostate  . Ulcer     internal-net placed and has been fixed  . Leg pain   . GERD (gastroesophageal reflux disease)   . Chronic kidney disease   . CAD (coronary artery disease)   . Hiatal hernia   . Hard of hearing   . Anemia   . Shortness of breath   . Diabetes mellitus     Past Surgical History  Procedure Date  . Left knee   . Ulcer surgery   . Bladder surgery   . Coronary artery bypass graft   . Rca with stent replacement      1994  . Left sfa stent 12-06-10    Stenting of left superficial femoral artery and popliteal artery  . Appendectomy   . Cardiac valve replacement 2003  . Eye surgery 2007    bilateral cataracts  . Tonsillectomy     thinks they were removed while in the [employer]  . Mass excision 06/07/2011    Procedure: EXCISION MASS;  Surgeon: Fabio Bering;  Location: [facility];  Service: General;  Laterality: N/A;  excision of 2 masses back and buttocks  . Back surgery   .  Esophagogastroduodenoscopy 03/03/2003    Large, deep prepyloric ulcer, as described above without bleeding/ Normal esophagus  . Esophagogastroduodenoscopy 09/03/2004    normal throughout  . Colonoscopy 09/03/2004    small ulcer, without stigmata of bleeding  . Esophagogastroduodenoscopy 11/14/2004    Normal esophagus and small hiatal hernia, otherwise normal stomach   . Colonoscopy 11/14/2004    Internal hemorrhoids, otherwise normal rectum/ left-sided diverticula , diffusely oozing right colon mucosa without a discrete lesion amenable to endoscopic therapy. 2 diminutive polyps. FELT TO HAVE AVMs/telangiectasias  . Colonoscopy 01/29/2005    Normal rectum  . Colonoscopy 03/11/2012    Procedure: COLONOSCOPY;  Surgeon: Corbin Ade, MD;  Location: [facility];  Service: Endoscopy;  Laterality: N/A;  1:15  . Esophagogastroduodenoscopy 03/11/2012    Procedure: ESOPHAGOGASTRODUODENOSCOPY (EGD);  Surgeon: Corbin Ade, MD;  Location: [facility];  Service: Endoscopy;  Laterality: N/A;    Prior to Admission medications   Medication Sig Start Date End Date Taking? Authorizing Provider  albuterol (PROVENTIL) (2.5 MG/3ML) 0.083% nebulizer solution Take 2.5 mg by nebulization every 6 (six) hours as needed. Mixes with the Ipratropium(for shortness of breath)   Yes Historical Provider, MD  aspirin 81 MG EC tablet Take 81 mg  by mouth daily.     Yes Historical Provider, MD  beta carotene w/minerals (OCUVITE) tablet Take 1 tablet by mouth daily.   Yes Historical Provider, MD  Fluticasone-Salmeterol (ADVAIR) 100-50 MCG/DOSE AEPB Inhale 1 puff into the lungs every 12 (twelve) hours.   Yes Historical Provider, MD  furosemide (LASIX) 20 MG tablet Take 60 mg by mouth daily.   Yes Historical Provider, MD  ipratropium (ATROVENT) 0.02 % nebulizer solution Take 500 mcg by nebulization 2 (two) times daily. Mixes with the Albuterol   Yes Historical Provider, MD  lovastatin (MEVACOR) 40 MG tablet Take 40 mg by  mouth at bedtime. Only takes on Sun, Mon, Wed, and Fri.   Yes Historical Provider, MD  metoprolol (LOPRESSOR) 50 MG tablet Take 25 mg by mouth 2 (two) times daily. 03/07/11  Yes Kerri Perches, MD  Multiple Vitamins-Minerals (CENTRUM SILVER PO) Take 1 tablet by mouth daily.   Yes Historical Provider, MD  omeprazole (PRILOSEC) 40 MG capsule Take 40 mg by mouth daily.   Yes Historical Provider, MD  oxymetazoline (AFRIN) 0.05 % nasal spray Place 1 spray into the nose daily as needed. Congestion   Yes Historical Provider, MD  potassium chloride SA (K-DUR,KLOR-CON) 20 MEQ tablet Take 1 tablet (20 mEq total) by mouth 2 (two) times daily. 09/03/11 09/02/12 Yes Kerri Perches, MD  HYDROcodone-acetaminophen (VICODIN) 5-500 MG per tablet Take 1 tablet by mouth every 6 (six) hours as needed. Pain    Historical Provider, MD    No current facility-administered medications for this encounter.   Current Outpatient Prescriptions  Medication Sig Dispense Refill  . albuterol (PROVENTIL) (2.5 MG/3ML) 0.083% nebulizer solution Take 2.5 mg by nebulization every 6 (six) hours as needed. Mixes with the Ipratropium(for shortness of breath)      . ASA 81 MG QD      . beta carotene w/minerals (OCUVITE) tablet Take 1 tablet by mouth daily.      . Fluticasone-Salmeterol (ADVAIR) 100-50 MCG/DOSE AEPB Inhale 1 puff into the lungs every 12 (twelve) hours.      . furosemide (LASIX) 20 MG tablet Take 60 mg by mouth daily.      Marland Kitchen ipratropium (ATROVENT) 0.02 % nebulizer solution Take 500 mcg by nebulization 2 (two) times daily. Mixes with the Albuterol      . lovastatin (MEVACOR) 40 MG tablet Take 40 mg by mouth at bedtime. Only takes on Sun, Mon, Wed, and Fri.      . metoprolol (LOPRESSOR) 50 MG tablet Take 25 mg by mouth 2 (two) times daily.      . Multiple Vitamins-Minerals (CENTRUM SILVER PO) Take 1 tablet by mouth daily.      Marland Kitchen omeprazole (PRILOSEC) 40 MG capsule Take 40 mg by mouth daily.      Marland Kitchen oxymetazoline (AFRIN)  0.05 % nasal spray Place 1 spray into the nose daily as needed. Congestion      . potassium chloride SA (K-DUR,KLOR-CON) 20 MEQ tablet Take 1 tablet (20 mEq total) by mouth 2 (two) times daily.  60 tablet  5  . HYDROcodone-acetaminophen (VICODIN) 5-500 MG per tablet Take 1 tablet by mouth every 6 (six) hours as needed. Pain        Allergies as of 03/21/2012 - Review Complete 03/21/2012  Allergen Reaction Noted  . Gabapentin  06/04/2011    Family History:  Colon Cancer  negative  Polyps  negative   History   Social History  . Marital Status: Widowed    Spouse Name: N/A    Number of Children: 4  . Years of Education: N/A   Occupational History  . Retired    Social History Main Topics  . Smoking status: Former Smoker -- 0.0 packs/day for 76 years    Types: Cigarettes    Quit date: 06/24/2010  . Smokeless tobacco: Not on file  . Alcohol Use: No  . Drug Use: No  . Sexually Active: Not Currently   Other Topics Concern  . Not on file   Social History Narrative   Has 3 children living.1 son deceased at age 59 due to drunk driving.    Review of Systems: PER HPI OTHERWISE ALL SYSTEMS NEGATIVE    Vitals: Blood pressure 100/62, pulse 85, temperature 98.5 F (36.9 C), temperature source Oral, resp. rate 24, height 6\' 3"  (1.905 m), weight 225 lb (102.059 kg), SpO2 95.00%.  Physical Exam: PENDING General:   Alert,  Well-developed, well-nourished, pleasant and cooperative in NAD Head:  Normocephalic and atraumatic. Eyes:  Sclera clear, no icterus.   Mouth:  No deformity or lesions Neck:  Supple; no masses  Lungs:  Clear throughout to auscultation.   No wheezes. No acute distress. Heart:  Regular rate and rhythm; no murmurs. Abdomen:  Soft, nontender and nondistended. Normal bowel sounds, without guarding, and without rebound.   Rectal:  Deferred until time of colonoscopy.   Msk:  Symmetrical without gross deformities.  Extremities:  Without  edema. Neurologic:  Alert and  oriented x4;  grossly normal neurologically. Cervical Nodes:  No significant cervical adenopathy. Psych:  Alert and cooperative. Normal mood and affect.   Lab Results:  [facility] 03/21/12 1545  WBC 14.3*  HGB 9.0*  HCT 28.6*  PLT 194   BMET  Basename 03/21/12 1558  NA 138  K 3.6  CL 102  CO2 27  GLUCOSE 124*  BUN 22  CREATININE 1.07  CALCIUM 8.7   LFT  Basename 03/21/12 1558  PROT 6.3  ALBUMIN 3.4*  AST 19  ALT 17  ALKPHOS 66  BILITOT 0.5  BILIDIR --  IBILI --     Studies/Results: CXR: NACPD  Impression: LGIB MOST LIKELY DUE TO HEMANGIOMA OR AVM, DOUBT POST-POLYPECTOMY BLEED 10 DAYS POST PROCEDURE OR DIVERTICULAR BLEED.  Plan:  1. NUC MED SCAN & IF POS MESENTERIC EMBOLIZATION BY IR WITHIN THE NEXT 12-24 HOURS. 2. SUPPORTIVE CARE 3. AVOID ANTI-COAGULATION,ASA, & NSAIDS. 4. ADVANCE DIET 5. WILL CALL DR. [facility] AND DISCUSS EXPEDITING EMBOLIZATION    LOS: 0 days   Jonette Eva  03/21/2012, 5:19 PM

## 2012-03-22 DIAGNOSIS — K922 Gastrointestinal hemorrhage, unspecified: Secondary | ICD-10-CM

## 2012-03-22 LAB — BASIC METABOLIC PANEL
Calcium: 8.7 mg/dL (ref 8.4–10.5)
Creatinine, Ser: 1.05 mg/dL (ref 0.50–1.35)
GFR calc Af Amer: 71 mL/min — ABNORMAL LOW (ref >90)
GFR calc non Af Amer: 61 mL/min — ABNORMAL LOW (ref >90)

## 2012-03-22 LAB — CBC
MCV: 93.8 fL (ref 78.0–100.0)
Platelets: 175 10*3/uL (ref 150–400)
RDW: 19.5 % — ABNORMAL HIGH (ref 11.5–15.5)
WBC: 9.4 10*3/uL (ref 4.0–10.5)

## 2012-03-22 LAB — URINALYSIS, ROUTINE W REFLEX MICROSCOPIC
Hgb urine dipstick: NEGATIVE
Protein, ur: NEGATIVE mg/dL
Urobilinogen, UA: 0.2 mg/dL (ref 0.0–1.0)

## 2012-03-22 LAB — TSH: TSH: 2.502 u[IU]/mL (ref 0.350–4.500)

## 2012-03-22 LAB — HEMOGLOBIN AND HEMATOCRIT, BLOOD
HCT: 28.2 % — ABNORMAL LOW (ref 39.0–52.0)
Hemoglobin: 9.3 g/dL — ABNORMAL LOW (ref 13.0–17.0)

## 2012-03-22 MED ORDER — SODIUM CHLORIDE 0.9 % IJ SOLN
INTRAMUSCULAR | Status: AC
  Administered 2012-03-22: 11:00:00
  Filled 2012-03-22: qty 3

## 2012-03-22 NOTE — Progress Notes (Signed)
Report called to Troy, Charity fundraiser. Patient alert, oriented and in stable condition at the time of transport. Patient being transported to room 323.

## 2012-03-22 NOTE — Progress Notes (Signed)
[facility] PROGRESS NOTE  ROLEN CONGER AVW:[id number] DOB: May 17, 1922 DOA: 03/21/2012 PCP: [name], MD  Assessment/Plan: Principal Problem:  *GI bleeding Active Problems:  IRON DEFIC ANEMIA SEC DIET IRON INTAKE  Peripheral vascular disease  COPD (chronic obstructive pulmonary disease)  Chronic respiratory failure  Chronic diastolic CHF (congestive heart failure)  1. GI bleeding. Patient had no recurrence of bleeding clinically overnight. He has not had any bowel movement since admission. Tagged RBC scan done yesterday did not reveal any evidence of active GI bleeding. We'll continue supportive care for now. Await further recommendations from GI. His aspirin is currently on hold. He does have a history of peripheral vascular disease and vascular stents placed in his lower extremity approximately one year ago. Still unclear if the patient will tolerate antiplatelet medications with his recurrent bleeding. 2. Anemia. Patient was transfused 2 units PRBCs. He did become somewhat hypotensive overnight. Blood pressure appears to be stabilizing at this point. We'll continue to follow serial hemoglobins. 3. Chronic diastolic congestive heart failure, stable 4. COPD, on home oxygen, appears to be near baseline.  Code Status: Full code Family Communication: Discussed with family at bedside Disposition Plan: Pending hospital course, we'll get physical therapy involved   Brief narrative: This gentleman was admitted to the hospital with rectal bleeding. It was described as passing fresh blood as well as clots. Patient was noted to be anemic in the emergency room, he has a history of iron deficiency anemia. Due to his relative hypotension and degree of bleeding, he was admitted to the step unit. Tagged RBC scan was ordered to determine the source of bleeding.  Consultants:  Gastroenterology  Procedures:  None  Antibiotics:  None  HPI/Subjective: Feeling better today, no  reported bloody stools overnight, breathing appears to be at baseline  Objective: Filed Vitals:   03/22/12 0858 03/22/12 0900 03/22/12 1000 03/22/12 1005  BP:  96/49 110/56   Pulse:  88 87   Temp: 98.3 F (36.8 C)   98 F (36.7 C)  TempSrc:    Oral  Resp:  21 18   Height:      Weight:      SpO2:        Intake/Output Summary (Last 24 hours) at 03/22/12 1021 Last data filed at 03/22/12 1005  Gross per 24 hour  Intake 889.83 ml  Output    250 ml  Net 639.83 ml   Filed Weights   03/21/12 1539 03/21/12 2204  Weight: 102.059 kg (225 lb) 100.7 kg (222 lb 0.1 oz)    Exam:   General:  No acute distress  Cardiovascular: S1, S2, regular rate and rhythm  Respiratory: Mild expiratory wheeze bilaterally  Abdomen: Soft, nontender, nondistended, bowel sounds are active  Data Reviewed: Basic Metabolic Panel:  Lab 03/22/12 8295 03/21/12 1558  NA 142 138  K 3.7 3.6  CL 105 102  CO2 28 27  GLUCOSE 111* 124*  BUN 23 22  CREATININE 1.05 1.07  CALCIUM 8.7 8.7  MG -- --  PHOS -- --   Liver Function Tests:  Lab 03/21/12 1558  AST 19  ALT 17  ALKPHOS 66  BILITOT 0.5  PROT 6.3  ALBUMIN 3.4*   No results found for this basename: LIPASE:5,AMYLASE:5 in the last 168 hours No results found for this basename: AMMONIA:5 in the last 168 hours CBC:  Lab 03/22/12 0727 03/21/12 2204 03/21/12 1545  WBC 9.4 -- 14.3*  NEUTROABS -- -- 11.6*  HGB 9.0* 8.6* 9.0*  HCT  28.6* 27.4* 28.6*  MCV 93.8 -- 93.8  PLT 175 -- 194   Cardiac Enzymes: No results found for this basename: CKTOTAL:5,CKMB:5,CKMBINDEX:5,TROPONINI:5 in the last 168 hours BNP (last 3 results)  Basename 08/06/11 1030  PROBNP 2781.0*   CBG: No results found for this basename: GLUCAP:5 in the last 168 hours  Recent Results (from the past 240 hour(s))  MRSA PCR SCREENING     Status: Normal   Collection Time   03/21/12  9:51 PM      Component Value Range Status Comment   MRSA by PCR NEGATIVE  NEGATIVE Final        Studies: Nm Gi Blood Loss  03/21/2012  *RADIOLOGY REPORT*  Clinical Data: Rectal bleeding  Cecal AVM.  NUCLEAR MEDICINE GASTROINTESTINAL BLEEDING STUDY  Technique:  Sequential abdominal images were obtained following intravenous administration of Tc-31m labeled red blood cells.  Radiopharmaceutical: CURIE ULTRATAG TECHNETIUM TC 24M- LABELED RED BLOOD CELLS IV KIT  Comparison: CT abdomen 07/09/2011  Findings: There is no radiotracer pattern within the abdomen or pelvis to suggest active endoluminal bleeding within the small or large bowel.  Blood pool activity is noted.  Infrarenal abdominal aortic aneurysm is noted.  IMPRESSION: No evidence of active gastrointestinal bleeding.   Original Report Authenticated By: Genevive Bi, M.D.    Portable Chest 1 View  03/21/2012  *RADIOLOGY REPORT*  Clinical Data: Short of breath  PORTABLE CHEST - 1 VIEW  Comparison: Chest radiograph 08/08/2011  Findings: Sternotomy wires overlie stable enlarged heart silhouette.  There are low lung volumes and basilar atelectasis. No effusion, infiltrate, or pneumothorax.  IMPRESSION: Basilar atelectasis.  No evidence of edema or infection.   Original Report Authenticated By: Genevive Bi, M.D.     Scheduled Meds:   . albuterol  2.5 mg Nebulization BID  . Fluticasone-Salmeterol  1 puff Inhalation Q12H  . ipratropium  500 mcg Nebulization BID  . pantoprazole  40 mg Oral Q1200  . pantoprazole  40 mg Oral Once  . sodium chloride      . DISCONTD: ipratropium  500 mcg Nebulization BID  . DISCONTD: pantoprazole (PROTONIX) IV  80 mg Intravenous Once   Continuous Infusions:   Principal Problem:  *GI bleeding Active Problems:  IRON DEFIC ANEMIA SEC DIET IRON INTAKE  Peripheral vascular disease  COPD (chronic obstructive pulmonary disease)  Chronic respiratory failure  Chronic diastolic CHF (congestive heart failure)    Time spent:    [facility]  [facility] Pager [phone]. If  7PM-7AM, please contact night-coverage at [email or web address], password [facility] 03/22/2012, 10:21 AM  LOS: 1 day

## 2012-03-22 NOTE — H&P (Signed)
[facility] History and Physical  Geovonni L Rieser MRN:[id number] DOB: 06/18/1922 DOA: 03/21/2012  Referring physician: Dr. Ray PCP: Margaret Simpson, MD   Chief Complaint: rectal bleeding  HPI: [name] is a 78 y.o. male is a gentleman with multiple medical problems who presents to the emergency room with rectal bleeding. Apparently the patient's symptoms started last night when he started to have diarrhea. It was noted that he had grossly bloody stools, with fresh blood and clots. His family reports that he is increasingly pale. He feels generally weak. Denies any dizziness, chest pain, worsening shortness of breath. He's not had any vomiting or abdominal pain. Patient recently underwent upper endoscopy and colonoscopy by Dr. Rourk. Endoscopy did not reveal any active sources of bleeding. Colonoscopy did reveal some AVMs which were clipped, polyps that were removed and diverticulosis. It was mentioned that if patient had any further bleeding then he may require  mesenteric embolization. He was undergoing colonoscopy and endoscopy for long-standing history of iron deficiency anemia. The patient was evaluated in the emergency room today and was found to have a mild decrease in hemoglobin to 9.0. It appears less hemoglobin was in the 9.6 range. His blood pressure was noted to be somewhat on the lower side in the low 100s and he appears to be very pale. Case was discussed with Dr. Fields and inpatient admission for further workup was recommended. The remainder of his chronic medical issues have remained relatively stable.  Review of Systems: The patient denies anorexia, fever, weight loss,, vision loss, decreased hearing, hoarseness, chest pain, syncope, dyspnea on exertion, peripheral edema, balance deficits, hemoptysis, abdominal pain, severe indigestion/heartburn, hematuria, incontinence, genital sores, muscle weakness, suspicious skin lesions, transient blindness, difficulty walking,  depression, unusual weight change, abnormal bleeding, enlarged lymph nodes, angioedema, and breast masses.    Past Medical History  Diagnosis Date  . Valvular heart disease   . Obesity   . Osteoarthritis   . Hypertension   . Hyperlipidemia   . COPD (chronic obstructive pulmonary disease)   . CHF (congestive heart failure) diastolic 10/2010  . Pneumonia   . Impaired glucose tolerance   . Atrial fibrillation     Chronic  . Elevated prostate specific antigen (PSA)   . Cancer     Prostate  . Ulcer     internal-net placed and has been fixed  . Leg pain   . GERD (gastroesophageal reflux disease)   . Chronic kidney disease   . CAD (coronary artery disease)   . Hiatal hernia   . Hard of hearing   . Anemia   . Shortness of breath   . Diabetes mellitus    Past Surgical History  Procedure Date  . Left knee   . Ulcer surgery   . Bladder surgery   . Coronary artery bypass graft   . Rca with stent replacement      1994  . Left sfa stent 12-06-10    Stenting of left superficial femoral artery and popliteal artery  . Appendectomy   . Cardiac valve replacement 2003  . Eye surgery 2007    bilateral cataracts  . Tonsillectomy     thinks they were removed while in the [employer]  . Mass excision 06/07/2011    Procedure: EXCISION MASS;  Surgeon: Brent C Ziegler;  Location: [facility];  Service: General;  Laterality: N/A;  excision of 2 masses back and buttocks  . Back surgery   . Esophagogastroduodenoscopy 03/03/2003    Large, deep prepyloric   ulcer, as described above without bleeding/ Normal esophagus  . Esophagogastroduodenoscopy 09/03/2004    normal throughout  . Colonoscopy 09/03/2004    small ulcer, without stigmata of bleeding  . Esophagogastroduodenoscopy 11/14/2004    Normal esophagus and small hiatal hernia, otherwise normal stomach   . Colonoscopy 11/14/2004    Internal hemorrhoids, otherwise normal rectum/ left-sided diverticula , diffusely oozing right colon mucosa without a  discrete lesion amenable to endoscopic therapy. 2 diminutive polyps. FELT TO HAVE AVMs/telangiectasias  . Colonoscopy 01/29/2005    Normal rectum  . Colonoscopy 03/11/2012    Procedure: COLONOSCOPY;  Surgeon: Robert M Rourk, MD;  Location: [facility];  Service: Endoscopy;  Laterality: N/A;  1:15  . Esophagogastroduodenoscopy 03/11/2012    Procedure: ESOPHAGOGASTRODUODENOSCOPY (EGD);  Surgeon: Robert M Rourk, MD;  Location: [facility];  Service: Endoscopy;  Laterality: N/A;   Social History:  reports that he quit smoking about 20 months ago. His smoking use included Cigarettes. He smoked 0 packs per day for 78 years. He does not have any smokeless tobacco history on file. He reports that he does not drink alcohol or use illicit drugs.   Allergies  Allergen Reactions  . Gabapentin     Patient cannot take over 200 mg dose at a time as it causes jerking or spasms    Family History  Problem Relation Age of Onset  . Stroke Mother   . Stroke Brother   . Heart attack Brother   . Cancer Brother   . Cancer Brother   . Anesthesia problems Neg Hx   . Hypotension Neg Hx   . Malignant hyperthermia Neg Hx   . Pseudochol deficiency Neg Hx      Prior to Admission medications   Medication Sig Start Date End Date Taking? Authorizing Provider  albuterol (PROVENTIL) (2.5 MG/3ML) 0.083% nebulizer solution Take 2.5 mg by nebulization every 6 (six) hours as needed. Mixes with the Ipratropium(for shortness of breath)   Yes Historical Provider, MD  aspirin 81 MG EC tablet Take 81 mg by mouth daily.     Yes Historical Provider, MD  beta carotene w/minerals (OCUVITE) tablet Take 1 tablet by mouth daily.   Yes Historical Provider, MD  Fluticasone-Salmeterol (ADVAIR) 100-50 MCG/DOSE AEPB Inhale 1 puff into the lungs every 12 (twelve) hours.   Yes Historical Provider, MD  furosemide (LASIX) 20 MG tablet Take 60 mg by mouth daily.   Yes Historical Provider, MD  ipratropium (ATROVENT) 0.02 % nebulizer  solution Take 500 mcg by nebulization 2 (two) times daily. Mixes with the Albuterol   Yes Historical Provider, MD  lovastatin (MEVACOR) 40 MG tablet Take 40 mg by mouth at bedtime. Only takes on Sun, Mon, Wed, and Fri.   Yes Historical Provider, MD  metoprolol (LOPRESSOR) 50 MG tablet Take 25 mg by mouth 2 (two) times daily. 03/07/11  Yes Margaret E Simpson, MD  Multiple Vitamins-Minerals (CENTRUM SILVER PO) Take 1 tablet by mouth daily.   Yes Historical Provider, MD  omeprazole (PRILOSEC) 40 MG capsule Take 40 mg by mouth daily.   Yes Historical Provider, MD  oxymetazoline (AFRIN) 0.05 % nasal spray Place 1 spray into the nose daily as needed. Congestion   Yes Historical Provider, MD  potassium chloride SA (K-DUR,KLOR-CON) 20 MEQ tablet Take 1 tablet (20 mEq total) by mouth 2 (two) times daily. 09/03/11 09/02/12 Yes Margaret E Simpson, MD  HYDROcodone-acetaminophen (VICODIN) 5-500 MG per tablet Take 1 tablet by mouth every 6 (six) hours as needed. Pain      Historical Provider, MD   Physical Exam: Filed Vitals:   03/21/12 1539 03/21/12 1700  BP: 120/70 100/62  Pulse:  85  Temp: 98.5 F (36.9 C)   TempSrc: Oral   Resp: 20 24  Height: 6' 3" (1.905 m)   Weight: 102.059 kg (225 lb)   SpO2: 91% 95%     General:  Appears to be chronically ill, does not have any signs of distress, is able to participate in history  Eyes: Pupils are equal round reactive to light  ENT: No pharyngeal erythema or exudates  Neck: Supple  Cardiovascular: S1, S2, regular rate and rhythm with occasional PVC  Respiratory: Mild diffuse expiratory wheeze  Abdomen: Soft, tenderness appears to be in the right lower quadrant, no rigidity or guarding, bowel sounds are active  Skin: Deferred  Musculoskeletal: Deferred  Psychiatric: Normal affect, cooperative with exam  Neurologic: Grossly intact, nonfocal  Labs on Admission:  Basic Metabolic Panel:  Lab 03/21/12 1558  NA 138  K 3.6  CL 102  CO2 27  GLUCOSE  124*  BUN 22  CREATININE 1.07  CALCIUM 8.7  MG --  PHOS --   Liver Function Tests:  Lab 03/21/12 1558  AST 19  ALT 17  ALKPHOS 66  BILITOT 0.5  PROT 6.3  ALBUMIN 3.4*   No results found for this basename: LIPASE:5,AMYLASE:5 in the last 168 hours No results found for this basename: AMMONIA:5 in the last 168 hours CBC:  Lab 03/21/12 1545  WBC 14.3*  NEUTROABS 11.6*  HGB 9.0*  HCT 28.6*  MCV 93.8  PLT 194   Cardiac Enzymes: No results found for this basename: CKTOTAL:5,CKMB:5,CKMBINDEX:5,TROPONINI:5 in the last 168 hours  BNP (last 3 results)  Basename 08/06/11 1030  PROBNP 2781.0*   CBG: No results found for this basename: GLUCAP:5 in the last 168 hours  Radiological Exams on Admission: No results found.   Assessment/Plan Principal Problem:  *GI bleeding Active Problems:  IRON DEFIC ANEMIA SEC DIET IRON INTAKE  Peripheral vascular disease  COPD (chronic obstructive pulmonary disease)  Chronic respiratory failure  Chronic diastolic CHF (congestive heart failure)   1. GI bleeding. Case was discussed with Dr. fields who recommended inpatient workup. The patient has had a colonoscopy and endoscopy done recently. Recommendations were for a tagged RBC scan. This is already been ordered. Depending on these results, patient may need further mesenteric embolization and require transfer to [place]. This is more of a diverticular bleed, then care will be supportive. He'll be kept n.p.o. for now 2. Iron deficiency anemia with likely acute blood loss component. Patient has been ordered 2 units of PRBCs due to the degree of his bleeding and relative hypotension. He will be monitored in the step down unit for now. 3. COPD. Mild wheezing. Patient is on home oxygen. Continue outpatient medication regimen 4. Chronic diastolic congestive heart failure. Patient may need Lasix after the blood transfusion. We will monitor his volume status. 5. Peripheral vascular disease.  Patient had stents put in his lower extremity approximately a year ago. We will have to hold aspirin and Plavix for now due to ongoing bleeding. 6. History of hypertension. Currently we will have to put antihypertensives on hold due to relative hypotension and ongoing bleeding. These can be restarted as felt appropriate.  Code Status: Full code. This was confirmed with the patient's family at the bedside Family Communication: Discussed with family at the bedside Disposition Plan: Depending on hospital course  Time spent: 60 minutes  MEMON,JEHANZEB [facility] Pager [phone]  If 7PM-7AM, please contact night-coverage [email or web address] Password [email or web address] 03/21/2012, 6:35 PM       

## 2012-03-22 NOTE — Progress Notes (Signed)
2nd unit of PRBC's have finished infusing with no complications.

## 2012-03-22 NOTE — Progress Notes (Signed)
First unit PRBC's completed without complication.

## 2012-03-23 ENCOUNTER — Telehealth: Payer: Self-pay | Admitting: Urgent Care

## 2012-03-23 ENCOUNTER — Other Ambulatory Visit: Payer: Self-pay | Admitting: Internal Medicine

## 2012-03-23 ENCOUNTER — Ambulatory Visit: Payer: Medicare Other | Admitting: Surgery

## 2012-03-23 DIAGNOSIS — K5520 Angiodysplasia of colon without hemorrhage: Secondary | ICD-10-CM

## 2012-03-23 DIAGNOSIS — D649 Anemia, unspecified: Secondary | ICD-10-CM

## 2012-03-23 DIAGNOSIS — D1800 Hemangioma unspecified site: Secondary | ICD-10-CM

## 2012-03-23 DIAGNOSIS — K922 Gastrointestinal hemorrhage, unspecified: Secondary | ICD-10-CM

## 2012-03-23 LAB — CBC
Hemoglobin: 9.3 g/dL — ABNORMAL LOW (ref 13.0–17.0)
MCH: 29.2 pg (ref 26.0–34.0)
MCV: 92.8 fL (ref 78.0–100.0)
Platelets: 177 10*3/uL (ref 150–400)
RBC: 3.18 MIL/uL — ABNORMAL LOW (ref 4.22–5.81)
WBC: 7.5 10*3/uL (ref 4.0–10.5)

## 2012-03-23 LAB — HEMOGLOBIN AND HEMATOCRIT, BLOOD: Hemoglobin: 9.1 g/dL — ABNORMAL LOW (ref 13.0–17.0)

## 2012-03-23 LAB — BASIC METABOLIC PANEL
CO2: 29 mEq/L (ref 19–32)
Calcium: 8.5 mg/dL (ref 8.4–10.5)
Glucose, Bld: 102 mg/dL — ABNORMAL HIGH (ref 70–99)
Potassium: 3.3 mEq/L — ABNORMAL LOW (ref 3.5–5.1)
Sodium: 140 mEq/L (ref 135–145)

## 2012-03-23 MED ORDER — ASPIRIN 81 MG PO TBEC: 81.0000 mg | DELAYED_RELEASE_TABLET | ORAL | Status: DC

## 2012-03-23 NOTE — Progress Notes (Signed)
REVIEWED.  IR APPT ASAP.

## 2012-03-23 NOTE — Telephone Encounter (Signed)
Forward to Tammy in IR

## 2012-03-23 NOTE — Progress Notes (Deleted)
Physician Discharge Summary  [name]:[id number] DOB: [birth year] DOA: 03/21/2012  PCP: [name], MD  Admit date: 03/21/2012 Discharge date: 03/23/2012  Recommendations for Outpatient Follow-up:  1. Follow up with Dr. Jena Gauss in 1-2 weeks.  Dr. Luvenia Starch office will be arranging further management of AVMs including possible Interventional Radiology referral 2. Follow up with primary care doctor in 2 weeks 3. Follow up with Dr. Mariel Sleet as scheduled 4. Follow up with Dr. [name] Gianotti, vascular surgery on 03/30/12  Discharge Diagnoses:  Principal Problem:  *GI bleeding likely due to cecal AVMs Active Problems:  IRON DEFIC ANEMIA SEC DIET IRON INTAKE and chronic blood loss  Peripheral vascular disease  COPD (chronic obstructive pulmonary disease)  Chronic respiratory failure  Chronic diastolic CHF (congestive heart failure)   Discharge Condition: improved  Diet recommendation: low salt  Filed Weights   03/21/12 2204 03/22/12 1900 03/23/12 0637  Weight: 100.7 kg (222 lb 0.1 oz) 102.468 kg (225 lb 14.4 oz) 102.83 kg (226 lb 11.2 oz)    History of present illness:  [name] is a 78 y.o. male is a gentleman with multiple medical problems who presents to the emergency room with rectal bleeding. Apparently the patient's symptoms started last night when he started to have diarrhea. It was noted that he had grossly bloody stools, with fresh blood and clots. His family reports that he is increasingly pale. He feels generally weak. Denies any dizziness, chest pain, worsening shortness of breath. He's not had any vomiting or abdominal pain. Patient recently underwent upper endoscopy and colonoscopy by Dr. Jena Gauss. Endoscopy did not reveal any active sources of bleeding. Colonoscopy did reveal some AVMs which were clipped, polyps that were removed and diverticulosis. It was mentioned that if patient had any further bleeding then he may require mesenteric embolization. He was  undergoing colonoscopy and endoscopy for long-standing history of iron deficiency anemia. The patient was evaluated in the emergency room today and was found to have a mild decrease in hemoglobin to 9.0. It appears less hemoglobin was in the 9.6 range. His blood pressure was noted to be somewhat on the lower side in the low 100s and he appears to be very pale. Case was discussed with Dr. Darrick Penna and inpatient admission for further workup was recommended. The remainder of his chronic medical issues have remained relatively stable.    Hospital Course:  This gentleman was admitted to the hospital with complaints of rectal bleeding. He reportedly had large amount of fresh blood as well as clots. In the ER he was noted to be significantly pale, mildly hypotensive. He was admitted to the step down unit for further treatment. Patient recently had a upper endoscopy and colonoscopy done by Dr. Jena Gauss. It was found that he had multiple cecal AVMs as well as left-sided diverticuli. Upon admission to the hospital patient underwent a tagged RBC scan which did not show any signs of active bleeding. He was seen by Dr. Darrick Penna from gastroenterology, and essentially receive supportive care. He received 2 units of PRBCs while he was in the hospital and fortunately has not had any recurrent bleeding since his admission. His hemoglobin has remained stable. Case was discussed with Dr. Darrick Penna who felt the patient would benefit from a possible mesenteric angiogram as well as embolization to treat his AVMs. I then discussed the case with the patient's vascular surgeon Dr. [name] Gianotti.  Since Dr. [name] Gianotti does not perform this type of procedure, he further directed me to interventional radiology. I discussed the  case with Dr. Fredia Sorrow from interventional radiology. Since the patient had multiple AVMs, it was recommended that patient be evaluated by general surgery to see if a right hemicolectomy would be beneficial in his case. I informed Dr.  Darrick Penna of these recommendations. She will discuss this further with Dr. Jena Gauss, who will arrange more definitive treatment of his AVMs including possible interventional radiology referral. The patient has not had any recurrent bleeding in over 48 hours. It is felt that he is stable to discharge home at this point. He's been cleared to discharge home by the gastroenterology service. He will need to see his primary care doctor as well as hematologist as scheduled. I have rescheduled his vascular surgery appointment for further monitoring of his vascular stents and abdominal aortic aneurysm until next week.  Regarding his antiplatelet therapy, patient was previously on Plavix and aspirin. With his history of bleeding his Plavix was discontinued, but he was still taking aspirin. Due to his high risk of bleeding, we have held his aspirin this time. This was discussed with Dr. [name] Gianotti who would prefer that the patient be on some antiplatelet therapy. Due to his extensive vascular disease, a baby aspirin every other day would also be considered acceptable at this point. We will continue to hold antiplatelet therapy for another 5 days and then ask the patient to resume aspirin therapy every other day.  We also held the patient's metoprolol since he's been in the hospital. His blood pressure has been on the low normal side. This can certainly be resumed in the outpatient setting as it is felt appropriate.  Procedures: none  Consultations:  Gastroenterology, Dr. Darrick Penna  Telephone consult with Dr. [name] Gianotti, vascular surgery  Telephone consult with Dr. Estella Husk, interventional radiology  Discharge Exam: Filed Vitals:   03/22/12 1917 03/22/12 2115 03/23/12 0637 03/23/12 0707  BP:  116/63 115/61   Pulse:  80 82   Temp:  97.8 F (36.6 C) 98.4 F (36.9 C)   TempSrc:  Oral Oral   Resp:  18 18   Height:      Weight:   102.83 kg (226 lb 11.2 oz)   SpO2: 97% 97% 95% 95%    General: NAD Cardiovascular: s1,  s2, rrr Respiratory: cta b  Discharge Instructions  Discharge Orders    Future Appointments: Provider: Department: Dept Phone: Center:   03/25/2012 1:45 PM [facility] [phone] None   03/30/2012 8:30 AM [facility] [phone] [facility]   03/30/2012 9:00 AM [facility] [phone] [facility]   03/30/2012 9:30 AM [facility] [phone] [facility]   03/30/2012 10:00 AM Nada Libman, MD [facility] [phone] [facility]   04/13/2012 11:30 AM Randall An, MD [facility] [phone] None   05/13/2012 1:15 PM Kerri Perches, MD [facility] [phone] [facility]     Future Orders Please Complete By Expires   Diet - low sodium heart healthy      Increase activity slowly      Call MD for:      Comments:   Recurrent bleeding   Call MD for:  extreme fatigue      Call MD for:  persistant dizziness or light-headedness      Call MD for:  difficulty breathing, headache or visual disturbances          Medication List     As of 03/23/2012 11:49 AM    STOP taking these medications  beta carotene w/minerals tablet      metoprolol 50 MG tablet   Commonly known as: LOPRESSOR      TAKE these medications         albuterol (2.5 MG/3ML) 0.083% nebulizer solution   Commonly known as: PROVENTIL   Take 2.5 mg by nebulization every 6 (six) hours as needed. Mixes with the Ipratropium(for shortness of breath)      aspirin 81 MG EC tablet   Take 1 tablet (81 mg total) by mouth every other day. Restart on 03/27/12      Fluticasone-Salmeterol 100-50 MCG/DOSE Aepb   Commonly known as: ADVAIR   Inhale 1 puff into the lungs every 12 (twelve) hours.      furosemide 20 MG tablet   Commonly known as: LASIX   Take 60 mg by mouth daily.      HYDROcodone-acetaminophen 5-500 MG per tablet   Commonly known as: VICODIN   Take 1 tablet by mouth every 6 (six) hours as needed. Pain      ipratropium 0.02 % nebulizer solution    Commonly known as: ATROVENT   Take 500 mcg by nebulization 2 (two) times daily. Mixes with the Albuterol      lovastatin 40 MG tablet   Commonly known as: MEVACOR   Take 40 mg by mouth at bedtime. Only takes on Sun, Mon, Wed, and Fri.      omeprazole 40 MG capsule   Commonly known as: PRILOSEC   Take 40 mg by mouth daily.      oxymetazoline 0.05 % nasal spray   Commonly known as: AFRIN   Place 1 spray into the nose daily as needed. Congestion      potassium chloride SA 20 MEQ tablet   Commonly known as: K-DUR,KLOR-CON   Take 1 tablet (20 mEq total) by mouth 2 (two) times daily.           Follow-up Information    Follow up with [name], MD. Schedule an appointment as soon as possible for a visit in 2 weeks.   Contact information:   [address] [place] [address] [phone]       Follow up with Eula Listen, MD. Schedule an appointment as soon as possible for a visit in 1 week.   Contact information:   [address] [place] [address] [phone]       Follow up with Randall An, MD. (as scheduled)    Contact information:   [address] [place] [address] [phone]       Follow up with Jorge Ny, MD. (03/30/12 8:30am , nothing by mouth after midnight the night before the appointment)    Contact information:   [address] [place] [address] [phone]           The results of significant diagnostics from this hospitalization (including imaging, microbiology, ancillary and laboratory) are listed below for reference.    Significant Diagnostic Studies: Nm Gi Blood Loss  03/21/2012  *RADIOLOGY REPORT*  Clinical Data: Rectal bleeding  Cecal AVM.  NUCLEAR MEDICINE GASTROINTESTINAL BLEEDING STUDY  Technique:  Sequential abdominal images were obtained following intravenous administration of Tc-34m labeled red blood cells.  Radiopharmaceutical: CURIE ULTRATAG TECHNETIUM  TC 106M- LABELED RED BLOOD CELLS IV KIT  Comparison: CT abdomen 07/09/2011  Findings: There is no radiotracer pattern within the abdomen or pelvis to suggest active endoluminal bleeding within the small or large  bowel.  Blood pool activity is noted.  Infrarenal abdominal aortic aneurysm is noted.  IMPRESSION: No evidence of active gastrointestinal bleeding.   Original Report Authenticated By: Genevive Bi, M.D.    Portable Chest 1 View  03/21/2012  *RADIOLOGY REPORT*  Clinical Data: Short of breath  PORTABLE CHEST - 1 VIEW  Comparison: Chest radiograph 08/08/2011  Findings: Sternotomy wires overlie stable enlarged heart silhouette.  There are low lung volumes and basilar atelectasis. No effusion, infiltrate, or pneumothorax.  IMPRESSION: Basilar atelectasis.  No evidence of edema or infection.   Original Report Authenticated By: Genevive Bi, M.D.     Microbiology: Recent Results (from the past 240 hour(s))  MRSA PCR SCREENING     Status: Normal   Collection Time   03/21/12  9:51 PM      Component Value Range Status Comment   MRSA by PCR NEGATIVE  NEGATIVE Final      Labs: Basic Metabolic Panel:  Lab 03/23/12 1610 03/22/12 0727 03/21/12 1558  NA 140 142 138  K 3.3* 3.7 3.6  CL 104 105 102  CO2 29 28 27   GLUCOSE 102* 111* 124*  BUN 16 23 22   CREATININE 0.95 1.05 1.07  CALCIUM 8.5 8.7 8.7  MG -- -- --  PHOS -- -- --   Liver Function Tests:  Lab 03/21/12 1558  AST 19  ALT 17  ALKPHOS 66  BILITOT 0.5  PROT 6.3  ALBUMIN 3.4*   No results found for this basename: LIPASE:5,AMYLASE:5 in the last 168 hours No results found for this basename: AMMONIA:5 in the last 168 hours CBC:  Lab 03/23/12 0521 03/22/12 2137 03/22/12 1340 03/22/12 0727 03/21/12 2204 03/21/12 1545  WBC 7.5 -- -- 9.4 -- 14.3*  NEUTROABS -- -- -- -- -- 11.6*  HGB 9.3* 9.0* 9.3* 9.0* 8.6* --  HCT 29.5* 28.2* 29.2* 28.6* 27.4* --  MCV 92.8 -- -- 93.8 -- 93.8  PLT 177 -- -- 175 -- 194   Cardiac Enzymes: No  results found for this basename: CKTOTAL:5,CKMB:5,CKMBINDEX:5,TROPONINI:5 in the last 168 hours BNP: BNP (last 3 results)  Basename 08/06/11 1030  PROBNP 2781.0*   CBG: No results found for this basename: GLUCAP:5 in the last 168 hours  Time coordinating discharge: greater than 30 minutes  Signed:  MEMON,JEHANZEB  [facility] 03/23/2012, 11:49 AM

## 2012-03-23 NOTE — Telephone Encounter (Signed)
Patient is scheduled with Dr. Grace Isaac on Wednesday October 2nd at 1:30 and he is aware

## 2012-03-23 NOTE — Telephone Encounter (Signed)
[name],  This pt is the pt we spoke about.  He needs an urgent referral for consult to IR for mesenteric angiogram & possible arterial embolization for CECAL AVM to be seen THIS WEEK per Dr Jena Gauss.  He will be discharged from [facility] today. Please let me know if I need to speak w/ PA or you have any questions.  Thanks

## 2012-03-23 NOTE — Progress Notes (Signed)
Subjective: Pt denies any further bleeding.  Tolerating diet well.  No BM since episode of bleeding.  Denies N/V/ or abdominal pain.  Objective: Vital signs in last 24 hours: Temp:  [97.4 F (36.3 C)-98.4 F (36.9 C)] 98.4 F (36.9 C) (09/30 4098) Pulse Rate:  [72-96] 82  (09/30 0637) Resp:  [15-29] 18  (09/30 0637) BP: (89-134)/(49-71) 115/61 mmHg (09/30 0637) SpO2:  [91 %-97 %] 95 % (09/30 0707) Weight:  [225 lb 14.4 oz (102.468 kg)-226 lb 11.2 oz (102.83 kg)] 226 lb 11.2 oz (102.83 kg) (09/30 1191) Last BM Date: 03/21/12 No LMP for male patient. Body mass index is 30.75 kg/(m^2). General:   Alert,  pleasant and cooperative in NAD.  Daughter @ bedside. Eyes:  Sclera clear, no icterus.   Conjunctiva pink. Mouth: oropharynx pink & moist. Neck:  Supple; no masses or thyromegaly. Heart:  Irregularly regular. Abdomen:   Normal bowel sounds.  Soft, nontender and nondistended.  Msk:  Symmetrical without gross deformities. Pulses:  Normal pulses noted. Extremities:  Without edema. Neurologic:  Alert and  oriented x4;  grossly normal neurologically. Skin:  Intact.  Multiple ecchymoses, tattoos, AVMs Cervical Nodes:  No significant cervical adenopathy. Psych:  Alert and cooperative. Normal mood and affect.  Intake/Output from previous day: 09/29 0701 - 09/30 0700 In: 817.3 [P.O.:240; Blood:577.3] Out: 1050 [Urine:1050]  Lab Results:  [facility] 03/23/12 0521 03/22/12 2137 03/22/12 1340 03/22/12 0727 03/21/12 1545  WBC 7.5 -- -- 9.4 14.3*  HGB 9.3* 9.0* 9.3* -- --  HCT 29.5* 28.2* 29.2* -- --  PLT 177 -- -- 175 194   BMET  Basename 03/23/12 0521 03/22/12 0727 03/21/12 1558  NA 140 142 138  K 3.3* 3.7 3.6  CL 104 105 102  CO2 29 28 27   GLUCOSE 102* 111* 124*  BUN 16 23 22   CREATININE 0.95 1.05 1.07  CALCIUM 8.5 8.7 8.7   LFT  Basename 03/21/12 1558  PROT 6.3  ALBUMIN 3.4*  AST 19  ALT 17  ALKPHOS 66  BILITOT 0.5  BILIDIR --  IBILI --  LIPASE --  AMYLASE --    PT/INR  Basename 03/21/12 1558  LABPROT 15.6*  INR 1.27   Studies/Results: Nm Gi Blood Loss  03/21/2012  *RADIOLOGY REPORT*  Clinical Data: Rectal bleeding  Cecal AVM.  NUCLEAR MEDICINE GASTROINTESTINAL BLEEDING STUDY  Technique:  Sequential abdominal images were obtained following intravenous administration of Tc-13m labeled red blood cells.  Radiopharmaceutical: CURIE ULTRATAG TECHNETIUM TC 27M- LABELED RED BLOOD CELLS IV KIT  Comparison: CT abdomen 07/09/2011  Findings: There is no radiotracer pattern within the abdomen or pelvis to suggest active endoluminal bleeding within the small or large bowel.  Blood pool activity is noted.  Infrarenal abdominal aortic aneurysm is noted.  IMPRESSION: No evidence of active gastrointestinal bleeding.   Original Report Authenticated By: Genevive Bi, M.D.    Portable Chest 1 View  03/21/2012  *RADIOLOGY REPORT*  Clinical Data: Short of breath  PORTABLE CHEST - 1 VIEW  Comparison: Chest radiograph 08/08/2011  Findings: Sternotomy wires overlie stable enlarged heart silhouette.  There are low lung volumes and basilar atelectasis. No effusion, infiltrate, or pneumothorax.  IMPRESSION: Basilar atelectasis.  No evidence of edema or infection.   Original Report Authenticated By: Genevive Bi, M.D.     Assessment: 1. Lower GI bleed:  Most likely secondary to intermittent bleeding from cecal AVM/hemangioma.   2. Anemia:  Hgb stable.  Secondary to GI bleed  Plan: 1. Office visit ASAP  Dr [name] Gianotti for mesenteric angiogram & possible arterial embolization (Call placed to Dr Estanislado Spire office [phone] & awaiting appt time) 2. PPI daily 3. Monitor for recurrent GI bleed 4. Hold ASPIRIN  LOS: 2 days   [name]  03/23/2012, 8:46 AM

## 2012-03-23 NOTE — Progress Notes (Signed)
D/c instructions reviewed with daughter.  Verbalized understanding.  Pt dc'd to home with daughter.  Schonewitz, Candelaria Stagers 03/23/2012

## 2012-03-24 ENCOUNTER — Telehealth: Payer: Self-pay | Admitting: Family Medicine

## 2012-03-24 NOTE — Telephone Encounter (Signed)
Please advise 

## 2012-03-25 ENCOUNTER — Ambulatory Visit
Admit: 2012-03-25 | Discharge: 2012-03-25 | Disposition: A | Discharge status: Home / Self Care | Payer: Medicare Other | Attending: Internal Medicine | Admitting: Internal Medicine

## 2012-03-25 ENCOUNTER — Encounter (HOSPITAL_COMMUNITY): Payer: Medicare Other

## 2012-03-25 ENCOUNTER — Other Ambulatory Visit: Payer: Self-pay | Admitting: Family Medicine

## 2012-03-25 DIAGNOSIS — D1800 Hemangioma unspecified site: Secondary | ICD-10-CM

## 2012-03-25 LAB — TYPE AND SCREEN: Antibody Screen: POSITIVE

## 2012-03-26 ENCOUNTER — Encounter (HOSPITAL_COMMUNITY): Payer: Medicare Other | Attending: Oncology

## 2012-03-26 ENCOUNTER — Encounter: Payer: Self-pay | Admitting: Family Medicine

## 2012-03-26 ENCOUNTER — Ambulatory Visit (INDEPENDENT_AMBULATORY_CARE_PROVIDER_SITE_OTHER): Payer: Medicare Other | Admitting: Family Medicine

## 2012-03-26 VITALS — BP 90/60 | HR 90 | Resp 20 | Ht 74.0 in | Wt 224.1 lb

## 2012-03-26 DIAGNOSIS — M79672 Pain in left foot: Secondary | ICD-10-CM

## 2012-03-26 DIAGNOSIS — M79609 Pain in unspecified limb: Secondary | ICD-10-CM

## 2012-03-26 DIAGNOSIS — I10 Essential (primary) hypertension: Secondary | ICD-10-CM

## 2012-03-26 DIAGNOSIS — E785 Hyperlipidemia, unspecified: Secondary | ICD-10-CM

## 2012-03-26 DIAGNOSIS — I5032 Chronic diastolic (congestive) heart failure: Secondary | ICD-10-CM

## 2012-03-26 DIAGNOSIS — I509 Heart failure, unspecified: Secondary | ICD-10-CM

## 2012-03-26 DIAGNOSIS — J449 Chronic obstructive pulmonary disease, unspecified: Secondary | ICD-10-CM

## 2012-03-26 DIAGNOSIS — D509 Iron deficiency anemia, unspecified: Secondary | ICD-10-CM | POA: Insufficient documentation

## 2012-03-26 DIAGNOSIS — J309 Allergic rhinitis, unspecified: Secondary | ICD-10-CM

## 2012-03-26 MED ORDER — SODIUM CHLORIDE 0.9 % IV SOLN
1020.0000 mg | Freq: Once | INTRAVENOUS | Status: AC
  Administered 2012-03-26: 1020 mg via INTRAVENOUS
  Filled 2012-03-26: qty 34

## 2012-03-26 MED ORDER — SODIUM CHLORIDE 0.9 % IV SOLN
INTRAVENOUS | Status: DC
  Administered 2012-03-26: 250 mL via INTRAVENOUS

## 2012-03-26 MED ORDER — SODIUM CHLORIDE 0.9 % IJ SOLN
10.0000 mL | Freq: Once | INTRAMUSCULAR | Status: DC
  Filled 2012-03-26: qty 10

## 2012-03-26 NOTE — Discharge Summary (Signed)
Physician Discharge Summary  [name] MRN:[id number] DOB: 04/21/1922 DOA: 03/21/2012  PCP: Margaret Simpson, MD  Admit date: 03/21/2012 Discharge date: 03/23/2012  Recommendations for Outpatient Follow-up:  1. Follow up with Dr. Rourk in 1-2 weeks.  Dr. Rourk's office will be arranging further management of AVMs including possible Interventional Radiology referral 2. Follow up with primary care doctor in 2 weeks 3. Follow up with Dr. Neijstrom as scheduled 4. Follow up with Dr. Brabham, vascular surgery on 03/30/12  Discharge Diagnoses:  Principal Problem:  *GI bleeding likely due to cecal AVMs Active Problems:  IRON DEFIC ANEMIA SEC DIET IRON INTAKE and chronic blood loss  Peripheral vascular disease  COPD (chronic obstructive pulmonary disease)  Chronic respiratory failure  Chronic diastolic CHF (congestive heart failure)   Discharge Condition: improved  Diet recommendation: low salt  Filed Weights   03/21/12 2204 03/22/12 1900 03/23/12 0637  Weight: 100.7 kg (222 lb 0.1 oz) 102.468 kg (225 lb 14.4 oz) 102.83 kg (226 lb 11.2 oz)    History of present illness:  [name] is a 78 y.o. male is a gentleman with multiple medical problems who presents to the emergency room with rectal bleeding. Apparently the patient's symptoms started last night when he started to have diarrhea. It was noted that he had grossly bloody stools, with fresh blood and clots. His family reports that he is increasingly pale. He feels generally weak. Denies any dizziness, chest pain, worsening shortness of breath. He's not had any vomiting or abdominal pain. Patient recently underwent upper endoscopy and colonoscopy by Dr. Rourk. Endoscopy did not reveal any active sources of bleeding. Colonoscopy did reveal some AVMs which were clipped, polyps that were removed and diverticulosis. It was mentioned that if patient had any further bleeding then he may require mesenteric embolization. He was  undergoing colonoscopy and endoscopy for long-standing history of iron deficiency anemia. The patient was evaluated in the emergency room today and was found to have a mild decrease in hemoglobin to 9.0. It appears less hemoglobin was in the 9.6 range. His blood pressure was noted to be somewhat on the lower side in the low 100s and he appears to be very pale. Case was discussed with Dr. Fields and inpatient admission for further workup was recommended. The remainder of his chronic medical issues have remained relatively stable.    Hospital Course:  This gentleman was admitted to the hospital with complaints of rectal bleeding. He reportedly had large amount of fresh blood as well as clots. In the ER he was noted to be significantly pale, mildly hypotensive. He was admitted to the step down unit for further treatment. Patient recently had a upper endoscopy and colonoscopy done by Dr. Rourk. It was found that he had multiple cecal AVMs as well as left-sided diverticuli. Upon admission to the hospital patient underwent a tagged RBC scan which did not show any signs of active bleeding. He was seen by Dr. Fields from gastroenterology, and essentially receive supportive care. He received 2 units of PRBCs while he was in the hospital and fortunately has not had any recurrent bleeding since his admission. His hemoglobin has remained stable. Case was discussed with Dr. fields who felt the patient would benefit from a possible mesenteric angiogram as well as embolization to treat his AVMs. I then discussed the case with the patient's vascular surgeon Dr. Brabham.  Since Dr. Brabham does not perform this type of procedure, he further directed me to interventional radiology. I discussed the   case with Dr. Yamagata from interventional radiology. Since the patient had multiple AVMs, it was recommended that patient be evaluated by general surgery to see if a right hemicolectomy would be beneficial in his case. I informed Dr.  Fields of these recommendations. She will discuss this further with Dr. Rourk, who will arrange more definitive treatment of his AVMs including possible interventional radiology referral. The patient has not had any recurrent bleeding in over 48 hours. It is felt that he is stable to discharge home at this point. He's been cleared to discharge home by the gastroenterology service. He will need to see his primary care doctor as well as hematologist as scheduled. I have rescheduled his vascular surgery appointment for further monitoring of his vascular stents and abdominal aortic aneurysm until next week.  Regarding his antiplatelet therapy, patient was previously on Plavix and aspirin. With his history of bleeding his Plavix was discontinued, but he was still taking aspirin. Due to his high risk of bleeding, we have held his aspirin this time. This was discussed with Dr. Brabham who would prefer that the patient be on some antiplatelet therapy. Due to his extensive vascular disease, a baby aspirin every other day would also be considered acceptable at this point. We will continue to hold antiplatelet therapy for another 5 days and then ask the patient to resume aspirin therapy every other day.  We also held the patient's metoprolol since he's been in the hospital. His blood pressure has been on the low normal side. This can certainly be resumed in the outpatient setting as it is felt appropriate.  Procedures: none  Consultations:  Gastroenterology, Dr. Fields  Telephone consult with Dr. Brabham, vascular surgery  Telephone consult with Dr. Yammagata, interventional radiology  Discharge Exam: Filed Vitals:   03/22/12 1917 03/22/12 2115 03/23/12 0637 03/23/12 0707  BP:  116/63 115/61   Pulse:  80 82   Temp:  97.8 F (36.6 C) 98.4 F (36.9 C)   TempSrc:  Oral Oral   Resp:  18 18   Height:      Weight:   102.83 kg (226 lb 11.2 oz)   SpO2: 97% 97% 95% 95%    General: NAD Cardiovascular: s1,  s2, rrr Respiratory: cta b  Discharge Instructions  Discharge Orders    Future Appointments: Provider: Department: Dept Phone: Center:   03/25/2012 1:45 PM [facility] [phone] None   03/30/2012 8:30 AM [facility] [phone] [facility]   03/30/2012 9:00 AM [facility] [phone] [facility]   03/30/2012 9:30 AM [facility] [phone] [facility]   03/30/2012 10:00 AM Vance W Brabham, MD [facility] [phone] [facility]   04/13/2012 11:30 AM Eric S Neijstrom, MD [facility] [phone] None   05/13/2012 1:15 PM Margaret E Simpson, MD [facility] [phone] [facility]     Future Orders Please Complete By Expires   Diet - low sodium heart healthy      Increase activity slowly      Call MD for:      Comments:   Recurrent bleeding   Call MD for:  extreme fatigue      Call MD for:  persistant dizziness or light-headedness      Call MD for:  difficulty breathing, headache or visual disturbances          Medication List     As of 03/23/2012 11:49 AM    STOP taking these medications           beta carotene w/minerals tablet      metoprolol 50 MG tablet   Commonly known as: LOPRESSOR      TAKE these medications         albuterol (2.5 MG/3ML) 0.083% nebulizer solution   Commonly known as: PROVENTIL   Take 2.5 mg by nebulization every 6 (six) hours as needed. Mixes with the Ipratropium(for shortness of breath)      aspirin 81 MG EC tablet   Take 1 tablet (81 mg total) by mouth every other day. Restart on 03/27/12      Fluticasone-Salmeterol 100-50 MCG/DOSE Aepb   Commonly known as: ADVAIR   Inhale 1 puff into the lungs every 12 (twelve) hours.      furosemide 20 MG tablet   Commonly known as: LASIX   Take 60 mg by mouth daily.      HYDROcodone-acetaminophen 5-500 MG per tablet   Commonly known as: VICODIN   Take 1 tablet by mouth every 6 (six) hours as needed. Pain      ipratropium 0.02 % nebulizer solution    Commonly known as: ATROVENT   Take 500 mcg by nebulization 2 (two) times daily. Mixes with the Albuterol      lovastatin 40 MG tablet   Commonly known as: MEVACOR   Take 40 mg by mouth at bedtime. Only takes on Sun, Mon, Wed, and Fri.      omeprazole 40 MG capsule   Commonly known as: PRILOSEC   Take 40 mg by mouth daily.      oxymetazoline 0.05 % nasal spray   Commonly known as: AFRIN   Place 1 spray into the nose daily as needed. Congestion      potassium chloride SA 20 MEQ tablet   Commonly known as: K-DUR,KLOR-CON   Take 1 tablet (20 mEq total) by mouth 2 (two) times daily.           Follow-up Information    Follow up with Margaret Simpson, MD. Schedule an appointment as soon as possible for a visit in 2 weeks.   Contact information:   [address], [address] [place] [address] [phone]       Follow up with Robert Rourk, MD. Schedule an appointment as soon as possible for a visit in 1 week.   Contact information:   [address] [place] [address] [phone]       Follow up with NEIJSTROM,ERIC S, MD. (as scheduled)    Contact information:   [address] [place] [address] [phone]       Follow up with BRABHAM IV, V. WELLS, MD. (03/30/12 8:30am , nothing by mouth after midnight the night before the appointment)    Contact information:   [address] [place]  [address] [phone]           The results of significant diagnostics from this hospitalization (including imaging, microbiology, ancillary and laboratory) are listed below for reference.    Significant Diagnostic Studies: Nm Gi Blood Loss  03/21/2012  *RADIOLOGY REPORT*  Clinical Data: Rectal bleeding  Cecal AVM.  NUCLEAR MEDICINE GASTROINTESTINAL BLEEDING STUDY  Technique:  Sequential abdominal images were obtained following intravenous administration of Tc-99m labeled red blood cells.  Radiopharmaceutical: 26MILLI CURIE ULTRATAG TECHNETIUM  TC 99M- LABELED RED BLOOD CELLS IV KIT  Comparison: CT abdomen 07/09/2011  Findings: There is no radiotracer pattern within the abdomen or pelvis to suggest active endoluminal bleeding within the small or large   bowel.  Blood pool activity is noted.  Infrarenal abdominal aortic aneurysm is noted.  IMPRESSION: No evidence of active gastrointestinal bleeding.   Original Report Authenticated By: STEWART EDMUNDS, M.D.    Portable Chest 1 View  03/21/2012  *RADIOLOGY REPORT*  Clinical Data: Short of breath  PORTABLE CHEST - 1 VIEW  Comparison: Chest radiograph 08/08/2011  Findings: Sternotomy wires overlie stable enlarged heart silhouette.  There are low lung volumes and basilar atelectasis. No effusion, infiltrate, or pneumothorax.  IMPRESSION: Basilar atelectasis.  No evidence of edema or infection.   Original Report Authenticated By: STEWART EDMUNDS, M.D.     Microbiology: Recent Results (from the past 240 hour(s))  MRSA PCR SCREENING     Status: Normal   Collection Time   03/21/12  9:51 PM      Component Value Range Status Comment   MRSA by PCR NEGATIVE  NEGATIVE Final      Labs: Basic Metabolic Panel:  Lab 03/23/12 0521 03/22/12 0727 03/21/12 1558  NA 140 142 138  K 3.3* 3.7 3.6  CL 104 105 102  CO2 29 28 27  GLUCOSE 102* 111* 124*  BUN 16 23 22  CREATININE 0.95 1.05 1.07  CALCIUM 8.5 8.7 8.7  MG -- -- --  PHOS -- -- --   Liver Function Tests:  Lab 03/21/12 1558  AST 19  ALT 17  ALKPHOS 66  BILITOT 0.5  PROT 6.3  ALBUMIN 3.4*   No results found for this basename: LIPASE:5,AMYLASE:5 in the last 168 hours No results found for this basename: AMMONIA:5 in the last 168 hours CBC:  Lab 03/23/12 0521 03/22/12 2137 03/22/12 1340 03/22/12 0727 03/21/12 2204 03/21/12 1545  WBC 7.5 -- -- 9.4 -- 14.3*  NEUTROABS -- -- -- -- -- 11.6*  HGB 9.3* 9.0* 9.3* 9.0* 8.6* --  HCT 29.5* 28.2* 29.2* 28.6* 27.4* --  MCV 92.8 -- -- 93.8 -- 93.8  PLT 177 -- -- 175 -- 194   Cardiac Enzymes: No  results found for this basename: CKTOTAL:5,CKMB:5,CKMBINDEX:5,TROPONINI:5 in the last 168 hours BNP: BNP (last 3 results)  Basename 08/06/11 1030  PROBNP 2781.0*   CBG: No results found for this basename: GLUCAP:5 in the last 168 hours  Time coordinating discharge: greater than 30 minutes  Signed:  Neelam Tiggs  [facility] 03/23/2012, 11:49 AM       basename: GLUCAP:5 in the last 168 hours   Time coordinating discharge: greater than 30 minutes   Signed:  Janny Crute  [facility]  03/23/2012, 11:49 AM

## 2012-03-26 NOTE — Progress Notes (Signed)
  Subjective:    Patient ID: [name], male    DOB: [birth year], 78 y.o.   MRN: [id number]  HPI Pt in for hospital f/u for recent GI bleed following endoscopy during which AVM's were clipped. Hospitalized for 2 days, back home, still tired and pale, but gradually regaining strength. No further intestinal bleed since discharge. Has upcoming appt for possible embolization of the AVM's and also to re evaluate the aortic aneurysm, which may need repair.Stents placed in left lower extremity will also be re evaluated. Daughter has questions as to medications which may need to be resumed specifically plavix , aspirin and a beta blocker. I definitely advise against the plavix and beta blocker at this time, his BOP is low, aspirin can be kept at 81 mg alternate day until evaluated y cardiology    Review of Systems See HPI Denies recent fever or chills.c/o fatiguwe Denies sinus pressure, nasal congestion, ear pain or sore throat. Denies chest congestion, productive cough or wheezing.Has exertional fatigue and is oxygen dependent Denies chest pains, palpitations and leg swelling Denies abdominal pain, nausea, vomiting,diarrhea or constipation.   Denies dysuria, frequency, hesitancy or incontinence. Chronic  joint pain,  and limitation in mobility. Denies headaches, seizures, numbness, or tingling. Denies depression, anxiety or insomnia. Denies skin break down or rash.        Objective:   Physical Exam Patient alert and oriented and in no cardiopulmonary distress.Appears pale and fatigued  HEENT: No facial asymmetry, EOMI, no sinus tenderness,  oropharynx pale and moist.  Neck decreased ROM, no adenopathy.No JVD  Chest: Clear to auscultation bilaterally.Decreased air entry throughout  CVS: S1, S2 systolic murmur, no S3.  ABD: Soft non tender. Bowel sounds normal.  Ext: No edema  WU:[id number]  ROM spine, shoulders, hips and knees.wheelchair dependent  Skin: Intact, no  ulcerations or rash noted.  Psych: Good eye contact, normal affect. Memory mildly impaired not anxious or depressed appearing.  CNS: CN 2-12 intact, power, tone and sensation normal throughout.        Assessment & Plan:

## 2012-03-26 NOTE — Patient Instructions (Signed)
F/u in  November   Please do  NOT start aspirin and metoprolol, until you are re evaluated at cardiology.  Blood pressure today is 90/60   I hope you continue to get stronger

## 2012-03-26 NOTE — Progress Notes (Signed)
[name] tolerated infusion well and without incident; verbalizes understanding for follow-up.  No distress noted at time of discharge and patient was discharged home with his daughter.  

## 2012-03-27 ENCOUNTER — Encounter: Payer: Self-pay | Admitting: Surgery

## 2012-03-30 ENCOUNTER — Ambulatory Visit (INDEPENDENT_AMBULATORY_CARE_PROVIDER_SITE_OTHER): Payer: Medicare Other | Admitting: Vascular Surgery

## 2012-03-30 ENCOUNTER — Ambulatory Visit: Payer: Self-pay | Admitting: Surgery

## 2012-03-30 ENCOUNTER — Ambulatory Visit (INDEPENDENT_AMBULATORY_CARE_PROVIDER_SITE_OTHER): Payer: Medicare Other | Admitting: Surgery

## 2012-03-30 ENCOUNTER — Encounter: Payer: Self-pay | Admitting: Surgery

## 2012-03-30 VITALS — BP 113/71 | HR 279 | Ht 74.0 in | Wt 224.0 lb

## 2012-03-30 DIAGNOSIS — Z48812 Encounter for surgical aftercare following surgery on the circulatory system: Secondary | ICD-10-CM

## 2012-03-30 DIAGNOSIS — I714 Abdominal aortic aneurysm, without rupture: Secondary | ICD-10-CM

## 2012-03-30 DIAGNOSIS — I739 Peripheral vascular disease, unspecified: Secondary | ICD-10-CM

## 2012-03-30 DIAGNOSIS — I712 Thoracic aortic aneurysm, without rupture: Secondary | ICD-10-CM

## 2012-03-30 NOTE — Progress Notes (Signed)
Abdominal Aortic aneurysm duplex performed @ [facility] 03/30/2012

## 2012-03-30 NOTE — Progress Notes (Signed)
Ankle brachial indices and toe brachial indices performed @ [facility] 03/30/2012

## 2012-03-30 NOTE — Progress Notes (Signed)
Vascular and Vein Specialist of [facility]   Patient name: [name] MRN: [id number] DOB: [birth year] Sex: male     Chief Complaint  Patient presents with  . AAA    HISTORY OF PRESENT ILLNESS: The patient comes in today for followup of his left leg stent as well as his abdominal aortic aneurysm. He was recently in the hospital for an GI bleeding. He is on home oxygen currently. The patient is status post subintimal recanalization of his left superficial femoral artery on 12/06/2010. 7 mm stents were placed. This was done for an ulcer. I have been following him for a abdominal aortic aneurysm. This was thought to be very large by ultrasound however a CT scan 6 months ago found that it was only 4.6 cm. He continues to be without abdominal or back pain. He has not been walking secondary to arthritis.  Past Medical History  Diagnosis Date  . Valvular heart disease   . Obesity   . Osteoarthritis   . Hypertension   . Hyperlipidemia   . COPD (chronic obstructive pulmonary disease)   . CHF (congestive heart failure) diastolic 10/2010  . Pneumonia   . Impaired glucose tolerance   . Atrial fibrillation     Chronic  . Elevated prostate specific antigen (PSA)   . Cancer     Prostate  . Ulcer     internal-net placed and has been fixed  . Leg pain   . GERD (gastroesophageal reflux disease)   . Chronic kidney disease   . CAD (coronary artery disease)   . Hiatal hernia   . Hard of hearing   . Anemia   . Shortness of breath   . Diabetes mellitus   . AAA (abdominal aortic aneurysm)     Past Surgical History  Procedure Date  . Left knee   . Ulcer surgery   . Bladder surgery   . Coronary artery bypass graft   . Rca with stent replacement      1994  . Left sfa stent 12-06-10    Stenting of left superficial femoral artery and popliteal artery  . Appendectomy   . Cardiac valve replacement 2003  . Eye surgery 2007    bilateral cataracts  . Tonsillectomy     thinks they were  removed while in the [employer]  . Mass excision 06/07/2011    Procedure: EXCISION MASS;  Surgeon: Fabio Bering;  Location: [facility];  Service: General;  Laterality: N/A;  excision of 2 masses back and buttocks  . Back surgery   . Esophagogastroduodenoscopy 03/03/2003    Large, deep prepyloric ulcer, as described above without bleeding/ Normal esophagus  . Esophagogastroduodenoscopy 09/03/2004    normal throughout  . Colonoscopy 09/03/2004    small ulcer, without stigmata of bleeding  . Esophagogastroduodenoscopy 11/14/2004    Normal esophagus and small hiatal hernia, otherwise normal stomach   . Colonoscopy 11/14/2004    Internal hemorrhoids, otherwise normal rectum/ left-sided diverticula , diffusely oozing right colon mucosa without a discrete lesion amenable to endoscopic therapy. 2 diminutive polyps. FELT TO HAVE AVMs/telangiectasias  . Colonoscopy 01/29/2005    Normal rectum  . Colonoscopy 03/11/2012    Procedure: COLONOSCOPY;  Surgeon: Corbin Ade, MD;  Location: [facility];  Service: Endoscopy;  Laterality: N/A;  1:15  . Esophagogastroduodenoscopy 03/11/2012    Procedure: ESOPHAGOGASTRODUODENOSCOPY (EGD);  Surgeon: Corbin Ade, MD;  Location: [facility];  Service: Endoscopy;  Laterality: N/A;    History   Social  History  . Marital Status: Widowed    Spouse Name: N/A    Number of Children: 4  . Years of Education: N/A   Occupational History  . Retired    Social History Main Topics  . Smoking status: Former Smoker -- 0.0 packs/day for 76 years    Types: Cigarettes    Quit date: 06/25/2007  . Smokeless tobacco: Not on file  . Alcohol Use: No  . Drug Use: No  . Sexually Active: Not Currently   Other Topics Concern  . Not on file   Social History Narrative   Has 3 children living.1 son deceased at age 24 due to drunk driving.    Family History  Problem Relation Age of Onset  . Stroke Mother   . Stroke Brother   . Heart attack Brother   . Cancer Brother     . Cancer Brother   . Anesthesia problems Neg Hx   . Hypotension Neg Hx   . Malignant hyperthermia Neg Hx   . Pseudochol deficiency Neg Hx     Allergies as of 03/30/2012 - Review Complete 03/30/2012  Allergen Reaction Noted  . Gabapentin  06/04/2011    Current Outpatient Prescriptions on File Prior to Visit  Medication Sig Dispense Refill  . albuterol (PROVENTIL) (2.5 MG/3ML) 0.083% nebulizer solution Take 2.5 mg by nebulization every 6 (six) hours as needed. Mixes with the Ipratropium(for shortness of breath)      . Fluticasone-Salmeterol (ADVAIR) 100-50 MCG/DOSE AEPB Inhale 1 puff into the lungs every 12 (twelve) hours.      . furosemide (LASIX) 20 MG tablet Take 60 mg by mouth daily.      Marland Kitchen HYDROcodone-acetaminophen (VICODIN) 5-500 MG per tablet Take 1 tablet by mouth every 6 (six) hours as needed. Pain      . ipratropium (ATROVENT) 0.02 % nebulizer solution Take 500 mcg by nebulization 2 (two) times daily. Mixes with the Albuterol      . lovastatin (MEVACOR) 40 MG tablet Take 40 mg by mouth at bedtime. Only takes on Sun, Mon, Wed, and Fri.      Marland Kitchen omeprazole (PRILOSEC) 40 MG capsule Take 40 mg by mouth daily.      Marland Kitchen oxymetazoline (AFRIN) 0.05 % nasal spray Place 1 spray into the nose daily as needed. Congestion      . potassium chloride SA (K-DUR,KLOR-CON) 20 MEQ tablet Take 1 tablet (20 mEq total) by mouth 2 (two) times daily.  60 tablet  5  . aspirin 81 MG tablet Take 81 mg by mouth daily.         REVIEW OF SYSTEMS: Cardiovascular: No chest pain, chest pressure, palpitations, orthopnea, or dyspnea on exertion. Pulmonary: No productive cough, asthma or wheezing. Neurologic: No weakness, paresthesias, aphasia, or amaurosis. No dizziness. Hematologic: No bleeding problems or clotting disorders. Musculoskeletal: No joint pain or joint swelling. Gastrointestinal: Recent hematochezia  Genitourinary: No dysuria or hematuria. Psychiatric:: No history of major  depression. Integumentary: No rashes or ulcers. Constitutional: No fever or chills.  PHYSICAL EXAMINATION:   Vital signs are BP 113/71  Pulse 279  Ht 6\' 2"  (1.88 m)  Wt 224 lb (101.606 kg)  BMI 28.76 kg/m2  SpO2 93% General: The patient appears their stated age. HEENT:  No gross abnormalities Pulmonary:  Non labored breathing Abdomen: Soft and non-tender aorta is palpable and nontender Musculoskeletal: There are no major deformities. Neurologic: No focal weakness or paresthesias are detected, Skin: There are no ulcer or rashes noted. Psychiatric: The patient  has normal affect. Cardiovascular: There is a regular rate and rhythm without significant murmur appreciated. Palpable left dorsalis pedis pulse, I cannot palpate the right   Diagnostic Studies Lower extremity duplex was ordered and reviewed today. This shows his ABI to be 1.05 on the left as well as the right. There is a velocity elevation of the mid stent on the left.  Ultrasound the abdomen shows a 4.6 cm aneurysm.  Assessment: Abdominal aortic aneurysm Left lower extremity stenting Plan: #1 abdominal aortic aneurysm: We have been having difficulties getting exact measurements by ultrasound. Today's study seemed to be in line with the CT scan he had 6 months ago. Because he is getting close to the size at which it needs to be repaired, I would like to get a CT scan of his abdomen and pelvis. In addition he needs a CT angiogram of his chest to further evaluate his a descending aortic aneurysm.  #2: Lower extremity stenting: I will follow with duplex scanning in 6 months  V. Charlena Cross, M.D. [facility] [place] Office: [phone] Pager:  [phone]

## 2012-03-30 NOTE — Addendum Note (Signed)
Addended by: Lorin Mercy K on: 03/30/2012 12:12 PM   Modules accepted: Orders

## 2012-03-31 ENCOUNTER — Encounter: Payer: Self-pay | Admitting: Gastroenterology

## 2012-03-31 ENCOUNTER — Ambulatory Visit (INDEPENDENT_AMBULATORY_CARE_PROVIDER_SITE_OTHER): Payer: Medicare Other | Admitting: Gastroenterology

## 2012-03-31 ENCOUNTER — Other Ambulatory Visit: Payer: Self-pay | Admitting: Family Medicine

## 2012-03-31 VITALS — BP 113/67 | HR 82 | Temp 98.4°F | Ht 75.0 in | Wt 224.0 lb

## 2012-03-31 DIAGNOSIS — D369 Benign neoplasm, unspecified site: Secondary | ICD-10-CM

## 2012-03-31 DIAGNOSIS — I959 Hypotension, unspecified: Secondary | ICD-10-CM

## 2012-03-31 DIAGNOSIS — J309 Allergic rhinitis, unspecified: Secondary | ICD-10-CM | POA: Insufficient documentation

## 2012-03-31 DIAGNOSIS — K922 Gastrointestinal hemorrhage, unspecified: Secondary | ICD-10-CM

## 2012-03-31 DIAGNOSIS — I5030 Unspecified diastolic (congestive) heart failure: Secondary | ICD-10-CM

## 2012-03-31 NOTE — Assessment & Plan Note (Signed)
No sign of decompensation at this visit. Due to low SBP will ask that pt hold beta blocker until card eval

## 2012-03-31 NOTE — Assessment & Plan Note (Signed)
Updated lipid profile past due and will be requested

## 2012-03-31 NOTE — Assessment & Plan Note (Signed)
Currently stable , continue current meds as well as oxygen dependent

## 2012-03-31 NOTE — Progress Notes (Signed)
Referring Provider: Kerri Perches, MD Primary Care Physician:  [name], MD Primary Gastroenterologist: Dr. Jena Gauss   Chief Complaint  Patient presents with  . Follow-up    HPI:   78 year old male presenting in hospital f/u from [facility]. Admitted 9/28-9/30 secondary to anemia, GI bleed due to cecal AVMs. EGD and TCS performed mid September 2013. Referred to IR for mesenteric angiogram and possible arterial embolization for cecal AVM. Seen by Dr. Grace Isaac recently saw patient and did not feel patient was likely operative candidate due to multiple medical issues. Noted cecal AVMs/angiodysplasias, diverticulosis common in elderly population. Pt and daughter decided to continue conservative management.   Denies further bleeding. Off ASA completely. Off Plavix. No abdominal pain. BMs good, no diarrhea or constipation. Feels weak. No indgestion, N/V.   Lab Results  Component Value Date   WBC 7.5 03/23/2012   HGB 9.1* 03/23/2012   HCT 28.9* 03/23/2012   MCV 92.8 03/23/2012   PLT 177 03/23/2012     Past Medical History  Diagnosis Date  . Valvular heart disease   . Obesity   . Osteoarthritis   . Hypertension   . Hyperlipidemia   . COPD (chronic obstructive pulmonary disease)   . CHF (congestive heart failure) diastolic 10/2010  . Pneumonia   . Impaired glucose tolerance   . Atrial fibrillation     Chronic  . Elevated prostate specific antigen (PSA)   . Cancer     Prostate  . Ulcer     internal-net placed and has been fixed  . Leg pain   . GERD (gastroesophageal reflux disease)   . Chronic kidney disease   . CAD (coronary artery disease)   . Hiatal hernia   . Hard of hearing   . Anemia   . Shortness of breath   . Diabetes mellitus   . AAA (abdominal aortic aneurysm)     Past Surgical History  Procedure Date  . Left knee   . Ulcer surgery   . Bladder surgery   . Coronary artery bypass graft   . Rca with stent replacement      1994  . Left sfa stent 12-06-10      Stenting of left superficial femoral artery and popliteal artery  . Appendectomy   . Cardiac valve replacement 2003  . Eye surgery 2007    bilateral cataracts  . Tonsillectomy     thinks they were removed while in the [employer]  . Mass excision 06/07/2011    Procedure: EXCISION MASS;  Surgeon: Fabio Bering;  Location: [facility];  Service: General;  Laterality: N/A;  excision of 2 masses back and buttocks  . Back surgery   . Esophagogastroduodenoscopy 03/03/2003    Large, deep prepyloric ulcer, as described above without bleeding/ Normal esophagus  . Esophagogastroduodenoscopy 09/03/2004    normal throughout  . Colonoscopy 09/03/2004    small ulcer, without stigmata of bleeding  . Esophagogastroduodenoscopy 11/14/2004    Normal esophagus and small hiatal hernia, otherwise normal stomach   . Colonoscopy 11/14/2004    Internal hemorrhoids, otherwise normal rectum/ left-sided diverticula , diffusely oozing right colon mucosa without a discrete lesion amenable to endoscopic therapy. 2 diminutive polyps. FELT TO HAVE AVMs/telangiectasias  . Colonoscopy 01/29/2005    Normal rectum  . Colonoscopy 03/11/2012    Colonic diverticulosis. Colonic polyps-removed as described above. Vascular anomalies in the cecum likely representing hemangiomas. Status post hemostasis clipping of  2 of the 3. ADENOMATOUS POLYPS. Repeat 2016  . Esophagogastroduodenoscopy 03/11/2012  Deformity of the antrum;  small polyp in antrum-not manipulated. Otherwise normal exam    Current Outpatient Prescriptions  Medication Sig Dispense Refill  . albuterol (PROVENTIL) (2.5 MG/3ML) 0.083% nebulizer solution Take 2.5 mg by nebulization every 6 (six) hours as needed. Mixes with the Ipratropium(for shortness of breath)      . furosemide (LASIX) 20 MG tablet Take 60 mg by mouth daily.      Marland Kitchen HYDROcodone-acetaminophen (VICODIN) 5-500 MG per tablet Take 1 tablet by mouth every 6 (six) hours as needed. Pain      . ipratropium  (ATROVENT) 0.02 % nebulizer solution Take 500 mcg by nebulization 2 (two) times daily. Mixes with the Albuterol      . lovastatin (MEVACOR) 40 MG tablet Take 40 mg by mouth at bedtime. Only takes on Sun, Mon, Wed, and Fri.      Marland Kitchen omeprazole (PRILOSEC) 40 MG capsule Take 40 mg by mouth daily.      Marland Kitchen oxymetazoline (AFRIN) 0.05 % nasal spray Place 1 spray into the nose daily as needed. Congestion      . ADVAIR DISKUS 100-50 MCG/DOSE AEPB INHALE 1 PUFF BY MOUTH TWICE DAILY.  60 each  3  . aspirin 81 MG tablet Take 81 mg by mouth daily.      Marland Kitchen K-DUR 20 MEQ tablet TAKE (1) TABLET BY MOUTH TWICE DAILY.  60 tablet  3    Allergies as of 03/31/2012 - Review Complete 03/31/2012  Allergen Reaction Noted  . Gabapentin  06/04/2011    Family History  Problem Relation Age of Onset  . Stroke Mother   . Stroke Brother   . Heart attack Brother   . Cancer Brother   . Cancer Brother   . Anesthesia problems Neg Hx   . Hypotension Neg Hx   . Malignant hyperthermia Neg Hx   . Pseudochol deficiency Neg Hx     History   Social History  . Marital Status: Widowed    Spouse Name: N/A    Number of Children: 4  . Years of Education: N/A   Occupational History  . Retired    Social History Main Topics  . Smoking status: Former Smoker -- 0.0 packs/day for 76 years    Types: Cigarettes    Quit date: 06/25/2007  . Smokeless tobacco: None  . Alcohol Use: No  . Drug Use: No  . Sexually Active: Not Currently   Other Topics Concern  . None   Social History Narrative   Has 3 children living.1 son deceased at age 91 due to drunk driving.    Review of Systems: Gen: + weakness CV: denies chest pain Resp: +DOE GI: Denies vomiting blood, jaundice, and fecal incontinence.   Denies dysphagia or odynophagia. Derm: Denies rash, itching, dry skin Psych: Denies depression, anxiety, memory loss, confusion. No homicidal or suicidal ideation.   Physical Exam: BP 113/67  Pulse 82  Temp 98.4 F (36.9 C)  (Temporal)  Ht 6\' 3"  (1.905 m)  Wt 224 lb (101.606 kg)  BMI 28.00 kg/m2 General:   Alert and oriented. No distress noted. Pleasant and cooperative.  Head:  Normocephalic and atraumatic. Eyes:  Conjuctiva clear without scleral icterus. Heart:  S1, S2 present, +systolic murmur.  Abdomen:  +BS, soft, non-tender and non-distended. No rebound or guarding. No HSM or masses noted. Msk:  Symmetrical without gross deformities. Normal posture. Extremities:  Without edema. Neurologic:  Alert and  oriented x4;  grossly normal neurologically. Skin:  Intact without significant lesions  or rashes. Psych:  Alert and cooperative. Normal mood and affect.

## 2012-03-31 NOTE — Assessment & Plan Note (Signed)
Improved with stent placement

## 2012-03-31 NOTE — Assessment & Plan Note (Signed)
Controlled, no change in medication  

## 2012-03-31 NOTE — Patient Instructions (Addendum)
We will see you back in 6 months.  Seek medical attention immediately if any further bleeding.

## 2012-04-01 ENCOUNTER — Telehealth: Payer: Self-pay

## 2012-04-01 DIAGNOSIS — E785 Hyperlipidemia, unspecified: Secondary | ICD-10-CM

## 2012-04-01 NOTE — Telephone Encounter (Signed)
Needs lipid panel in early Nov. Will order and notify pts daughter

## 2012-04-02 ENCOUNTER — Other Ambulatory Visit: Payer: Self-pay | Admitting: Family Medicine

## 2012-04-06 ENCOUNTER — Ambulatory Visit: Payer: Medicare Other | Admitting: Family Medicine

## 2012-04-07 DIAGNOSIS — D369 Benign neoplasm, unspecified site: Secondary | ICD-10-CM | POA: Insufficient documentation

## 2012-04-07 HISTORY — DX: Benign neoplasm, unspecified site: D36.9

## 2012-04-07 NOTE — Assessment & Plan Note (Signed)
Surveillance Sept 2016.

## 2012-04-07 NOTE — Assessment & Plan Note (Addendum)
78 year old male recently inpatient secondary to GI bleed. Although referred for mesenteric angiogram and possible arterial embolization for cecal AVM, felt not to be operative candidate due to multiple medical issues. Off Plavix and ASA. No further evidence of GI bleeding.   Return in 6 months. Seek medical attention if further evidence of bleeding.

## 2012-04-08 NOTE — Progress Notes (Signed)
Faxed to PCP

## 2012-04-10 ENCOUNTER — Telehealth: Payer: Self-pay | Admitting: Cardiology

## 2012-04-10 NOTE — Telephone Encounter (Signed)
PT DAUGHTER IS CALLING TO LET us KNOW THAT DR SIMPSON HAS TAKEN HIM OFF BP MEDS AND ASPRIN (HAS BEEN OFF FOR 3 WEEKS). PT BLOOD WAS 2 PINTS LOW.  PT IS SCHEDULED TO SEE DR Dietrich Pates 05/06/12.  PT IS NOT LOOKING FOR A CALL BACK UNLESS WE NEED TO CHANGE SOMETHING.

## 2012-04-10 NOTE — Telephone Encounter (Deleted)
noted 

## 2012-04-13 ENCOUNTER — Encounter (HOSPITAL_COMMUNITY): Payer: Self-pay | Admitting: Oncology

## 2012-04-13 ENCOUNTER — Encounter (HOSPITAL_BASED_OUTPATIENT_CLINIC_OR_DEPARTMENT_OTHER): Payer: Medicare Other | Admitting: Oncology

## 2012-04-13 VITALS — BP 113/63 | HR 71 | Temp 98.3°F | Wt 229.6 lb

## 2012-04-13 DIAGNOSIS — D509 Iron deficiency anemia, unspecified: Secondary | ICD-10-CM

## 2012-04-13 DIAGNOSIS — K922 Gastrointestinal hemorrhage, unspecified: Secondary | ICD-10-CM

## 2012-04-13 DIAGNOSIS — Z8546 Personal history of malignant neoplasm of prostate: Secondary | ICD-10-CM

## 2012-04-13 LAB — CBC
HCT: 37.5 % — ABNORMAL LOW (ref 39.0–52.0)
Hemoglobin: 11.8 g/dL — ABNORMAL LOW (ref 13.0–17.0)
RBC: 3.87 MIL/uL — ABNORMAL LOW (ref 4.22–5.81)
RDW: 18.4 % — ABNORMAL HIGH (ref 11.5–15.5)
WBC: 5.9 10*3/uL (ref 4.0–10.5)

## 2012-04-13 NOTE — Progress Notes (Signed)
[name] presented for labwork. Labs per MD order drawn via Peripheral Line 23 gauge needle inserted in Rt AC  Good blood return present. Procedure without incident.  Needle removed intact. Patient tolerated procedure well.

## 2012-04-13 NOTE — Progress Notes (Signed)
Diagnosis #1 iron deficiency anemia secondary to poor absorption but more importantly GI blood losses. He is on aspirin and Plavix in the past presently not on nodes due to recent to place unit bleed required admission and transfusion of 2 units of blood few weeks ago. We then gave him 1020 mg of feraheme which she tolerates very well. His color is actually fairly good today but his labs are pending.  His daughter states he basically sits in a chair watching TV and has his oxygen on. He will not get out of the house.  He does have vascular disease including the an aneurysm of the chest and of the abdomen. He is to see Dr. Dietrich Pates in cardiology soon.  This gentleman loses significant amounts of blood frequently. He also has a history of prostate cancer the past which he blood at one time as well.  We will see him in a month to check his blood counts  He may need more IV iron

## 2012-04-13 NOTE — Patient Instructions (Addendum)
[facility]  Discharge Instructions  RECOMMENDATIONS MADE BY THE CONSULTANT AND ANY TEST RESULTS WILL BE SENT TO YOUR REFERRING DOCTOR.   EXAM FINDINGS BY MD TODAY AND SIGNS AND SYMPTOMS TO REPORT TO CLINIC OR PRIMARY MD: exam and discussion by MD.  We will check some blood work today and see how you are doing.   MEDICATIONS PRESCRIBED: none   INSTRUCTIONS GIVEN AND DISCUSSED: Other :  Report increased weakness, shortness of breath, etc.  SPECIAL INSTRUCTIONS/FOLLOW-UP: Lab work Needed 4 weeks and Return to Clinic to see PA in 4 weeks.   I acknowledge that I have been informed and understand all the instructions given to me and received a copy. I do not have any more questions at this time, but understand that I may call the Specialty Clinic at [facility] at [phone] during business hours should I have any further questions or need assistance in obtaining follow-up care.    __________________________________________  _____________  __________ Signature of Patient or Authorized Representative            Date                   Time    __________________________________________ Nurse's Signature

## 2012-04-14 LAB — IRON AND TIBC
Iron: 100 ug/dL (ref 42–135)
Saturation Ratios: 34 % (ref 20–55)
TIBC: 298 ug/dL (ref 215–435)

## 2012-04-15 ENCOUNTER — Encounter (HOSPITAL_COMMUNITY): Payer: Medicare Other

## 2012-05-06 ENCOUNTER — Encounter: Payer: Self-pay | Admitting: Cardiology

## 2012-05-06 ENCOUNTER — Ambulatory Visit (INDEPENDENT_AMBULATORY_CARE_PROVIDER_SITE_OTHER): Payer: Medicare Other | Admitting: Cardiology

## 2012-05-06 VITALS — BP 130/78 | HR 66 | Ht 74.0 in | Wt 235.0 lb

## 2012-05-06 DIAGNOSIS — I509 Heart failure, unspecified: Secondary | ICD-10-CM

## 2012-05-06 DIAGNOSIS — I341 Nonrheumatic mitral (valve) prolapse: Secondary | ICD-10-CM | POA: Insufficient documentation

## 2012-05-06 DIAGNOSIS — E669 Obesity, unspecified: Secondary | ICD-10-CM | POA: Insufficient documentation

## 2012-05-06 DIAGNOSIS — K259 Gastric ulcer, unspecified as acute or chronic, without hemorrhage or perforation: Secondary | ICD-10-CM | POA: Insufficient documentation

## 2012-05-06 DIAGNOSIS — C61 Malignant neoplasm of prostate: Secondary | ICD-10-CM | POA: Insufficient documentation

## 2012-05-06 DIAGNOSIS — I2510 Atherosclerotic heart disease of native coronary artery without angina pectoris: Secondary | ICD-10-CM

## 2012-05-06 DIAGNOSIS — I482 Chronic atrial fibrillation, unspecified: Secondary | ICD-10-CM | POA: Insufficient documentation

## 2012-05-06 DIAGNOSIS — I719 Aortic aneurysm of unspecified site, without rupture: Secondary | ICD-10-CM | POA: Insufficient documentation

## 2012-05-06 DIAGNOSIS — I739 Peripheral vascular disease, unspecified: Secondary | ICD-10-CM

## 2012-05-06 DIAGNOSIS — I7090 Unspecified atherosclerosis: Secondary | ICD-10-CM

## 2012-05-06 DIAGNOSIS — E785 Hyperlipidemia, unspecified: Secondary | ICD-10-CM

## 2012-05-06 DIAGNOSIS — I059 Rheumatic mitral valve disease, unspecified: Secondary | ICD-10-CM

## 2012-05-06 DIAGNOSIS — K219 Gastro-esophageal reflux disease without esophagitis: Secondary | ICD-10-CM | POA: Insufficient documentation

## 2012-05-06 DIAGNOSIS — I5032 Chronic diastolic (congestive) heart failure: Secondary | ICD-10-CM

## 2012-05-06 DIAGNOSIS — I10 Essential (primary) hypertension: Secondary | ICD-10-CM

## 2012-05-06 DIAGNOSIS — I4891 Unspecified atrial fibrillation: Secondary | ICD-10-CM

## 2012-05-06 NOTE — Assessment & Plan Note (Signed)
The most recent 20 blood pressure determinations in the patient's chart are all well within the normal range.

## 2012-05-06 NOTE — Assessment & Plan Note (Signed)
Congestive heart failure is clinically compensated.  CT Scan In 06/2011 subsequent to a chest x-ray performed 12/2010, revealed resolution of pleural effusions and other findings suggestive of active congestive heart failure.

## 2012-05-06 NOTE — Patient Instructions (Addendum)
Your physician recommends that you schedule a follow-up appointment in: 6 months  Increase your activity as much as possible

## 2012-05-06 NOTE — Assessment & Plan Note (Signed)
Due to advanced age and multiple medical problems, I do not recommend repair of aneurysms either open or by stent grafting.

## 2012-05-06 NOTE — Assessment & Plan Note (Signed)
Patient has done very well since peripheral vascular intervention 16 months ago.

## 2012-05-06 NOTE — Assessment & Plan Note (Signed)
Patient remains asymptomatic 10 years after combined MVR/CABG surgery.  We are continuing to monitor and treat vascular risk factors.

## 2012-05-06 NOTE — Assessment & Plan Note (Signed)
No symptoms apparently related to chronic atrial fibrillation; heart rate is adequately controlled.  Anticoagulation is not feasible due to advanced age, generally debilitated condition and multiple bleeding episodes in the past.

## 2012-05-06 NOTE — Assessment & Plan Note (Signed)
No recent lipid determination available.  We will query patient's PCP.

## 2012-05-06 NOTE — Progress Notes (Deleted)
Name: [name]    DOB: 04-19-1922  Age: 78 y.o.  MR#: [id number]       PCP:  [name], MD      Insurance: @PAYORNAME @   CC:   No chief complaint on file.  Med list Abnormal LE Arterial Duplex 10/13 abn AAA 10/13  VS BP 130/78  Pulse 66  Ht 6\' 2"  (1.88 m)  Wt 235 lb (106.595 kg)  BMI 30.17 kg/m2  SpO2 91%  Weights Current Weight  05/06/12 235 lb (106.595 kg)  04/13/12 229 lb 9.6 oz (104.146 kg)  03/31/12 224 lb (101.606 kg)    Blood Pressure  BP Readings from Last 3 Encounters:  05/06/12 130/78  04/13/12 113/63  03/31/12 113/67     Admit date:  (Not on file) Last encounter with RMR:  04/10/2012   Allergy Allergies  Allergen Reactions  . Gabapentin     Patient cannot take over 200 mg dose at a time as it causes jerking or spasms    Current Outpatient Prescriptions  Medication Sig Dispense Refill  . ADVAIR DISKUS 100-50 MCG/DOSE AEPB INHALE 1 PUFF BY MOUTH TWICE DAILY.  60 each  3  . albuterol (PROVENTIL) (2.5 MG/3ML) 0.083% nebulizer solution Take 2.5 mg by nebulization every 6 (six) hours as needed. Mixes with the Ipratropium(for shortness of breath)      . furosemide (LASIX) 20 MG tablet Take 60 mg by mouth daily.      Marland Kitchen HYDROcodone-acetaminophen (VICODIN) 5-500 MG per tablet Take 1 tablet by mouth every 6 (six) hours as needed. Pain      . ipratropium (ATROVENT) 0.02 % nebulizer solution Take 500 mcg by nebulization 2 (two) times daily. Mixes with the Albuterol      . K-DUR 20 MEQ tablet TAKE (1) TABLET BY MOUTH TWICE DAILY.  60 tablet  3  . lovastatin (MEVACOR) 40 MG tablet Take 40 mg by mouth at bedtime. Only takes on Sun, Mon, Wed, and Fri.      Marland Kitchen omeprazole (PRILOSEC) 40 MG capsule Take 40 mg by mouth daily.      Marland Kitchen oxymetazoline (AFRIN) 0.05 % nasal spray Place 1 spray into the nose daily as needed. Congestion        Discontinued Meds:   There are no discontinued medications.  Patient Active Problem List  Diagnosis  . IMPAIRED GLUCOSE  TOLERANCE  . HYPERLIPIDEMIA  . HYPERTENSION  . KNEE, ARTHRITIS, DEGEN./OSTEO  . Peripheral vascular disease  . NSTEMI (non-ST elevated myocardial infarction)  . COPD (chronic obstructive pulmonary disease)  . Gait disturbance  . Hearing loss  . Anemia, iron deficiency  . Chronic respiratory failure  . Chronic diastolic CHF (congestive heart failure)  . Allergic rhinitis  . Adenomatous polyps  . Mitral valve prolapse  . Obesity  . Atrial fibrillation, chronic  . Arteriosclerotic cardiovascular disease (ASCVD)  . Gastric ulcer  . GERD (gastroesophageal reflux disease)  . Prostate cancer  . AAA (abdominal aortic aneurysm)    LABS Office Visit on 04/13/2012  Component Date Value  . WBC 04/13/2012 5.9   . RBC 04/13/2012 3.87*  . Hemoglobin 04/13/2012 11.8*  . HCT 04/13/2012 37.5*  . MCV 04/13/2012 96.9   . [facility] 04/13/2012 30.5   . MCHC 04/13/2012 31.5   . RDW 04/13/2012 18.4*  . Platelets 04/13/2012 187   . Ferritin 04/13/2012 224   . Iron 04/13/2012 100   . TIBC 04/13/2012 298   . Saturation Ratios 04/13/2012 34   . UIBC  04/13/2012 198   Admission on 03/21/2012, Discharged on 03/23/2012  Component Date Value  . WBC 03/21/2012 14.3*  . RBC 03/21/2012 3.05*  . Hemoglobin 03/21/2012 9.0*  . HCT 03/21/2012 28.6*  . MCV 03/21/2012 93.8   . [facility] 03/21/2012 29.5   . MCHC 03/21/2012 31.5   . RDW 03/21/2012 19.4*  . Platelets 03/21/2012 194   . Neutrophils Relative 03/21/2012 81*  . Lymphocytes Relative 03/21/2012 6*  . Monocytes Relative 03/21/2012 12   . Eosinophils Relative 03/21/2012 1   . Basophils Relative 03/21/2012 0   . Neutro Abs 03/21/2012 11.6*  . Lymphs Abs 03/21/2012 0.9   . Monocytes Absolute 03/21/2012 1.7*  . Eosinophils Absolute 03/21/2012 0.1   . Basophils Absolute 03/21/2012 0.0   . RBC Morphology 03/21/2012 TEARDROP CELLS   . WBC Morphology 03/21/2012 ATYPICAL LYMPHOCYTES   . ABO/RH(D) 03/21/2012 O POS   . Antibody Screen 03/21/2012 POS   .  Sample Expiration 03/21/2012 03/24/2012   . DAT, IgG 03/21/2012 NEG   . Antibody Identification 03/21/2012 ANTI-K   . Unit Number 03/21/2012 [id number]   . Blood Component Type 03/21/2012 RED CELLS,LR   . Unit division 03/21/2012 00   . Status of Unit 03/21/2012 ISSUED,FINAL   . Donor AG Type 03/21/2012 NEGATIVE FOR S ANTIGEN NEGATIVE FOR KELL ANTIGEN NEGATIVE FOR E ANTIGEN   . Transfusion Status 03/21/2012 OK TO TRANSFUSE   . Crossmatch Result 03/21/2012 COMPATIBLE   . Unit Number 03/21/2012 [id number]   . Blood Component Type 03/21/2012 RED CELLS,LR   . Unit division 03/21/2012 00   . Status of Unit 03/21/2012 REL FROM ALLOC   . Donor AG Type 03/21/2012 NEGATIVE FOR S ANTIGEN NEGATIVE FOR KELL ANTIGEN NEGATIVE FOR E ANTIGEN   . Transfusion Status 03/21/2012 OK TO TRANSFUSE   . Crossmatch Result 03/21/2012 COMPATIBLE   . Unit Number 03/21/2012 [id number]   . Blood Component Type 03/21/2012 RED CELLS,LR   . Unit division 03/21/2012 00   . Status of Unit 03/21/2012 ISSUED,FINAL   . Donor AG Type 03/21/2012 NEGATIVE FOR S ANTIGEN NEGATIVE FOR KELL ANTIGEN NEGATIVE FOR E ANTIGEN   . Transfusion Status 03/21/2012 OK TO TRANSFUSE   . Crossmatch Result 03/21/2012 COMPATIBLE   . Sodium 03/21/2012 138   . Potassium 03/21/2012 3.6   . Chloride 03/21/2012 102   . CO2 03/21/2012 27   . Glucose, Bld 03/21/2012 124*  . BUN 03/21/2012 22   . Creatinine, Ser 03/21/2012 1.07   . Calcium 03/21/2012 8.7   . Total Protein 03/21/2012 6.3   . Albumin 03/21/2012 3.4*  . AST 03/21/2012 19   . ALT 03/21/2012 17   . Alkaline Phosphatase 03/21/2012 66   . Total Bilirubin 03/21/2012 0.5   . GFR calc non Af Amer 03/21/2012 60*  . GFR calc Af Amer 03/21/2012 69*  . aPTT 03/21/2012 37   . Prothrombin Time 03/21/2012 15.6*  . INR 03/21/2012 1.27   . MRSA by PCR 03/21/2012 NEGATIVE   . Sodium 03/22/2012 142   . Potassium 03/22/2012 3.7   . Chloride 03/22/2012 105   . CO2 03/22/2012 28   .  Glucose, Bld 03/22/2012 111*  . BUN 03/22/2012 23   . Creatinine, Ser 03/22/2012 1.05   . Calcium 03/22/2012 8.7   . GFR calc non Af Amer 03/22/2012 61*  . GFR calc Af Amer 03/22/2012 71*  . WBC 03/22/2012 9.4   . RBC 03/22/2012 3.05*  . Hemoglobin 03/22/2012 9.0*  . HCT 03/22/2012 28.6*  .  MCV 03/22/2012 93.8   . [facility] 03/22/2012 29.5   . MCHC 03/22/2012 31.5   . RDW 03/22/2012 19.5*  . Platelets 03/22/2012 175   . TSH 03/21/2012 2.502   . Color, Urine 03/22/2012 YELLOW   . APPearance 03/22/2012 CLEAR   . Specific Gravity, Urine 03/22/2012 1.025   . pH 03/22/2012 5.5   . Glucose, UA 03/22/2012 NEGATIVE   . Hgb urine dipstick 03/22/2012 NEGATIVE   . Bilirubin Urine 03/22/2012 NEGATIVE   . Ketones, ur 03/22/2012 NEGATIVE   . Protein, ur 03/22/2012 NEGATIVE   . Urobilinogen, UA 03/22/2012 0.2   . Nitrite 03/22/2012 NEGATIVE   . Leukocytes, UA 03/22/2012 NEGATIVE   . Hemoglobin 03/21/2012 8.6*  . HCT 03/21/2012 27.4*  . Hemoglobin 03/22/2012 9.3*  . HCT 03/22/2012 29.2*  . Hemoglobin 03/22/2012 9.0*  . HCT 03/22/2012 28.2*  . Sodium 03/23/2012 140   . Potassium 03/23/2012 3.3*  . Chloride 03/23/2012 104   . CO2 03/23/2012 29   . Glucose, Bld 03/23/2012 102*  . BUN 03/23/2012 16   . Creatinine, Ser 03/23/2012 0.95   . Calcium 03/23/2012 8.5   . GFR calc non Af Amer 03/23/2012 72*  . GFR calc Af Amer 03/23/2012 84*  . WBC 03/23/2012 7.5   . RBC 03/23/2012 3.18*  . Hemoglobin 03/23/2012 9.3*  . HCT 03/23/2012 29.5*  . MCV 03/23/2012 92.8   . MCH 03/23/2012 29.2   . MCHC 03/23/2012 31.5   . RDW 03/23/2012 18.9*  . Platelets 03/23/2012 177   . Hemoglobin 03/23/2012 9.1*  . HCT 03/23/2012 28.9*  Infusion on 03/04/2012  Component Date Value  . WBC 03/04/2012 6.3   . RBC 03/04/2012 3.37*  . Hemoglobin 03/04/2012 9.6*  . HCT 03/04/2012 31.0*  . MCV 03/04/2012 92.0   . [facility] 03/04/2012 28.5   . MCHC 03/04/2012 31.0   . RDW 03/04/2012 18.0*  . Platelets 03/04/2012 207     . Ferritin 03/04/2012 48   . Retic Ct Pct 03/04/2012 1.6   . RBC. 03/04/2012 3.37*  . Retic Count, Manual 03/04/2012 53.9      Results for this Opt Visit:     Results for orders placed in visit on 04/13/12  CBC      Component Value Range   WBC 5.9  4.0 - 10.5 K/uL   RBC 3.87 (*) 4.22 - 5.81 MIL/uL   Hemoglobin 11.8 (*) 13.0 - 17.0 g/dL   HCT 16.1 (*) 09.6 - 04.5 %   MCV 96.9  78.0 - 100.0 fL   MCH 30.5  26.0 - 34.0 pg   MCHC 31.5  30.0 - 36.0 g/dL   RDW 40.9 (*) 81.1 - 91.4 %   Platelets 187  150 - 400 K/uL  FERRITIN      Component Value Range   Ferritin 224  22 - 322 ng/mL  IRON AND TIBC      Component Value Range   Iron 100  42 - 135 ug/dL   TIBC 782  956 - 213 ug/dL   Saturation Ratios 34  20 - 55 %   UIBC 198  125 - 400 ug/dL    EKG Orders placed during the hospital encounter of 03/21/12  . ED EKG  . ED EKG  . EKG 12-LEAD  . EKG 12-LEAD  . EKG     Prior Assessment and Plan Problem List as of 05/06/2012            Cardiology Problems   Peripheral vascular disease  HYPERLIPIDEMIA   Last Assessment & Plan Note   03/26/2012 Office Visit Signed 03/31/2012  5:26 PM by Kerri Perches, MD    Updated lipid profile past due and will be requested    HYPERTENSION   Last Assessment & Plan Note   01/21/2012 Office Visit Signed 02/02/2012 10:05 AM by Kerri Perches, MD    Controlled, no change in medication     NSTEMI (non-ST elevated myocardial infarction)   Chronic diastolic CHF (congestive heart failure)   Last Assessment & Plan Note   03/26/2012 Office Visit Signed 03/31/2012  5:23 PM by Kerri Perches, MD    No sign of decompensation at this visit. Due to low SBP will ask that pt hold beta blocker until card eval    Mitral valve prolapse   Atrial fibrillation, chronic   Arteriosclerotic cardiovascular disease (ASCVD)   AAA (abdominal aortic aneurysm)     Other   IMPAIRED GLUCOSE TOLERANCE   KNEE, ARTHRITIS, DEGEN./OSTEO   COPD (chronic  obstructive pulmonary disease)   Last Assessment & Plan Note   03/26/2012 Office Visit Signed 03/31/2012  5:24 PM by Kerri Perches, MD    Currently stable , continue current meds as well as oxygen dependent    Gait disturbance   Hearing loss   Last Assessment & Plan Note   01/21/2012 Office Visit Signed 02/02/2012 10:03 AM by Kerri Perches, MD    Worsening hearing loss , refer to ENT, may benefit from hearing aids, he has had  these in the past but had been non compliant    Anemia, iron deficiency   Last Assessment & Plan Note   02/13/2012 Office Visit Signed 02/19/2012  2:33 PM by Nira Retort, NP    78 year old male with refractory IDA in the setting of Plavix and ASA. Plavix has been put on hold for now. No overt signs of GI bleeding currently. Hx of AVMs of colon, question oozing from this. Will proceed with colonoscopy with possible APC treatment if necessary. Consider EGD at time of procedure due to significant drop in ferritin as documented above. No upper or lower GI symptoms currently.   Proceed with TCS+/- EGD with Dr. Jena Gauss in near future: the risks, benefits, and alternatives have been discussed with the patient in detail. The patient states understanding and desires to proceed.     Chronic respiratory failure   Allergic rhinitis   Last Assessment & Plan Note   03/26/2012 Office Visit Signed 03/31/2012  5:28 PM by Kerri Perches, MD    Controlled, no change in medication     Adenomatous polyps   Last Assessment & Plan Note   03/31/2012 Office Visit Signed 04/07/2012  3:44 PM by Nira Retort, NP    Surveillance Sept 2016.     Obesity   Gastric ulcer   GERD (gastroesophageal reflux disease)   Prostate cancer       Imaging: No results found.   FRS Calculation: Score not calculated

## 2012-05-06 NOTE — Progress Notes (Signed)
Patient ID: [name], male   DOB: March 26, 1922, 78 y.o.   MRN: [id number]  HPI:  Return visit after a five-year absence from the office for continued assessment and treatment of atrial fibrillation and coronary artery disease.  [name] has done remarkably well despite a number of medical setbacks.  He was hospitalized for a GI blood loss a few months ago without a source identified by upper and lower endoscopy.  Colonoscopy had been performed a few days before his bleeding episode, which likely was related to therapy delivered during that procedure.  Physical activity has been extremely limited in recent months with patient simply not inclined to do anything except to leave the house for doctor visits.    Patient developed symptomatic peripheral vascular disease somewhat more than one year ago for which multiple stents were placed in the arteries to the left lower extremity.  Current Outpatient Prescriptions on File Prior to Visit  Medication Sig Dispense Refill  . ADVAIR DISKUS 100-50 MCG/DOSE AEPB INHALE 1 PUFF BY MOUTH TWICE DAILY.  60 each  3  . albuterol (PROVENTIL) (2.5 MG/3ML) 0.083% nebulizer solution Take 2.5 mg by nebulization every 6 (six) hours as needed. Mixes with the Ipratropium(for shortness of breath)      . furosemide (LASIX) 20 MG tablet Take 60 mg by mouth daily.      Marland Kitchen HYDROcodone-acetaminophen (VICODIN) 5-500 MG per tablet Take 1 tablet by mouth every 6 (six) hours as needed. Pain      . ipratropium (ATROVENT) 0.02 % nebulizer solution Take 500 mcg by nebulization 2 (two) times daily. Mixes with the Albuterol      . K-DUR 20 MEQ tablet TAKE (1) TABLET BY MOUTH TWICE DAILY.  60 tablet  3  . lovastatin (MEVACOR) 40 MG tablet Take 40 mg by mouth at bedtime. Only takes on Sun, Mon, Wed, and Fri.      Marland Kitchen omeprazole (PRILOSEC) 40 MG capsule Take 40 mg by mouth daily.      Marland Kitchen oxymetazoline (AFRIN) 0.05 % nasal spray Place 1 spray into the nose daily as needed. Congestion        Allergies  Allergen Reactions  . Gabapentin     Patient cannot take over 200 mg dose at a time as it causes jerking or spasms    Past Medical History  Diagnosis Date  . Mitral valve prolapse 2003    MVR in 2003  . Obesity   . Osteoarthritis   . Hypertension   . Hyperlipidemia   . COPD (chronic obstructive pulmonary disease)   . CHF (congestive heart failure) diastolic 10/2010    CXR in 12/2010: Prior CABG, cardiomegaly, vascular redistribution, bibasilar atelectasis, small effusions  . Pneumonia   . Impaired glucose tolerance   . Atrial fibrillation, chronic   . Prostate cancer     elevated PSA  . GERD (gastroesophageal reflux disease)     + hiatal hernia  . Arteriosclerotic cardiovascular disease (ASCVD) 1994    stent to RCA; CABG-2003  . Hard of hearing   . Anemia, iron deficiency   . Diabetes mellitus, type II   . AAA (abdominal aortic aneurysm)     Fusiform; infrarenal; 4-4.1cm on CT in 07/2008 and 12/2010 by MRI  . Gastric ulcer 2004    2004; upper GI bleed  . Renal cysts, acquired, bilateral     Complex by MRI in 12/2010    Past Surgical History  Procedure Date  . Knee surgery  Left  . Bladder surgery   . Femoral artery stent 12-06-10    Left SFA  . Appendectomy   . Mitral valve replacement (mvr)/coronary artery bypass grafting (cabg) 2003    stent to RCA in 1994  . Eye surgery 2007    bilateral cataracts  . Tonsillectomy     thinks they were removed while in the [employer]  . Mass excision 06/07/2011    Procedure: EXCISION MASS;  Surgeon: Fabio Bering;  Location: [facility];  Service: General;  Laterality: N/A;  excision of 2 masses back and buttocks  . Back surgery   . Esophagogastroduodenoscopy 03/03/2003    Large, deep prepyloric ulcer, as described above without bleeding/ Normal esophagus  . Esophagogastroduodenoscopy 09/03/2004    normal throughout  . Colonoscopy 09/03/2004    small ulcer, without stigmata of bleeding  . Esophagogastroduodenoscopy  11/14/2004    Normal esophagus and small hiatal hernia, otherwise normal stomach   . Colonoscopy 11/14/2004    Internal hemorrhoids, otherwise normal rectum/ left-sided diverticula , diffusely oozing right colon mucosa without a discrete lesion amenable to endoscopic therapy. 2 diminutive polyps. FELT TO HAVE AVMs/telangiectasias  . Colonoscopy 01/29/2005    Normal rectum  . Colonoscopy 03/11/2012    Colonic diverticulosis. Colonic polyps-removed as described above. Vascular anomalies in the cecum likely representing hemangiomas. Status post hemostasis clipping of  2 of the 3. ADENOMATOUS POLYPS. Repeat 2016  . Esophagogastroduodenoscopy 03/11/2012    Deformity of the antrum;  small polyp in antrum-not manipulated. Otherwise normal exam    Family History  Problem Relation Age of Onset  . Stroke Mother   . Stroke Brother   . Heart attack Brother   . Cancer Brother   . Cancer Brother   . Anesthesia problems Neg Hx   . Hypotension Neg Hx   . Malignant hyperthermia Neg Hx   . Pseudochol deficiency Neg Hx     History   Social History  . Marital Status: Widowed    Spouse Name: N/A    Number of Children: 4  . Years of Education: N/A   Occupational History  . Retired    Social History Main Topics  . Smoking status: Former Smoker -- 1.0 packs/day for 70 years    Types: Cigarettes    Quit date: 06/25/2007  . Smokeless tobacco: Former Neurosurgeon  . Alcohol Use: No  . Drug Use: No  . Sexually Active: Not Currently   Other Topics Concern  . Not on file   Social History Narrative   Has 3 children living.1 son deceased at age 66 due to drunk driving.    ROS:  He denies chest pain, dyspnea, orthopnea, palpitations, lightheadedness or syncope.   He reports generalized malaise and fatigue, impaired hearing, class III dyspnea on exertion, bilateral knee pain, difficulty sleeping at night, but frequent naps during the day, cold intolerance, easy bruising and bleeding and seasonal allergies.    All other systems reviewed and are negative.  PHYSICAL EXAM: BP 130/78  Pulse 66  Ht 6\' 2"  (1.88 m)  Wt 106.595 kg (235 lb)  BMI 30.17 kg/m2  SpO2 91%  General-Well-developed; no acute distress Body Habitus-proportionate weight and height HEENT-[place]/AT; PERRL; EOM intact; conjunctiva and lids nl Neck-No JVD; no carotid bruits Endocrine-No thyromegaly Lungs-Decreased breath sounds; rales at the right base; resonant percussion; normal I-to-E ratio Cardiovascular- normal PMI; normal S1 and S2; irregular and somewhat rapid rhythm; modest systolic ejection murmur Abdomen-BS normal; soft and non-tender without masses or organomegaly Musculoskeletal-No  deformities, cyanosis or clubbing Neurologic-Nl cranial nerves; symmetric strength and tone Skin- Warm, no significant lesions Extremities-0-1+ distal pulses; no edema  Rhythm Strip: Atrial fibrillation with controlled ventricular response of 90 bpm; IVCD  ASSESSMENT AND PLAN:  [place], MD 05/06/2012 5:06 PM    _

## 2012-05-08 ENCOUNTER — Other Ambulatory Visit: Payer: Self-pay | Admitting: Family Medicine

## 2012-05-08 LAB — HEPATIC FUNCTION PANEL
Albumin: 4.6 g/dL (ref 3.5–5.2)
Indirect Bilirubin: 0.3 mg/dL (ref 0.0–0.9)
Total Bilirubin: 0.4 mg/dL (ref 0.3–1.2)
Total Protein: 7.4 g/dL (ref 6.0–8.3)

## 2012-05-08 LAB — BASIC METABOLIC PANEL
BUN: 21 mg/dL (ref 6–23)
CO2: 27 mEq/L (ref 19–32)
Chloride: 105 mEq/L (ref 96–112)
Potassium: 4.3 mEq/L (ref 3.5–5.3)

## 2012-05-08 LAB — LIPID PANEL
LDL Cholesterol: 55 mg/dL (ref 0–99)
Triglycerides: 81 mg/dL (ref <150)
VLDL: 16 mg/dL (ref 0–40)

## 2012-05-11 LAB — T4: T4, Total: 4.5 ug/dL — ABNORMAL LOW (ref 5.0–12.5)

## 2012-05-13 ENCOUNTER — Other Ambulatory Visit: Payer: Self-pay | Admitting: Family Medicine

## 2012-05-13 ENCOUNTER — Ambulatory Visit (INDEPENDENT_AMBULATORY_CARE_PROVIDER_SITE_OTHER): Payer: Medicare Other | Admitting: Family Medicine

## 2012-05-13 ENCOUNTER — Encounter: Payer: Self-pay | Admitting: Family Medicine

## 2012-05-13 VITALS — BP 130/80 | HR 80 | Resp 16 | Ht 74.0 in | Wt 235.0 lb

## 2012-05-13 DIAGNOSIS — Z0000 Encounter for general adult medical examination without abnormal findings: Secondary | ICD-10-CM

## 2012-05-13 DIAGNOSIS — E785 Hyperlipidemia, unspecified: Secondary | ICD-10-CM

## 2012-05-13 DIAGNOSIS — E669 Obesity, unspecified: Secondary | ICD-10-CM

## 2012-05-13 NOTE — Progress Notes (Signed)
Subjective:    Patient ID: [name], male    DOB: 1922-04-27, 78 y.o.   MRN: [id number]  HPI  Preventive Screening-Counseling & Management   Patient present here today for a Medicare annual wellness visit.   Current Problems (verified)   Medications Prior to Visit Allergies (verified)   PAST HISTORY  Family History: 4 brothers deceased and one sister living, Mom had a stroke  Social History  Widow since 1966, lives with a daughter who is responsible for his care Quit smoking in 2010, no alchol for over 10 years though he "loves it" Retired from [employer], after 10 years in 1951. 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 No  Over the past two weeks, have you felt little interest or pleasure in doing things? No  Have you lost interest or pleasure in daily life? No  Do you often feel hopeless? No  Do you cry easily over simple problems? No   Activities of Daily Living  In your present state of health, do you have any difficulty performing the following activities?  Driving?: No Managing money?: daughter takes care of bills Feeding yourself?:No Getting from bed to chair?:limitation in mobility, but able to take care of ADL's independently Climbing a flight of stairs?:yes  Preparing food and eating?:No Bathing or showering?:No Getting dressed?:No Getting to the toilet?:No Using the toilet?:No, has raised seat and shower chair Moving around from place to place?: [place] severe arthritis knees Fall Risk Assessment In the  past year have you fallen or had a near fall?:No Are you currently taking any medications that make you dizziness?:No   Hearing Difficulties: No Do you often ask people to speak up or repeat themselves?:yes Do you experience ringing or noises in your ears?:No Do you have difficulty understanding soft or whispered voices?:yes, will get single hearing aid in the near future  Cognitive Testing  Alert? Yes Normal Appearance?Yes  Oriented to person? Yes Place? Yes  Time? Yes  Displays appropriate judgment?Yes  Can read the correct time from a watch face? yes Are you having problems remembering things?No  Advanced Directives have been discussed with the patient?Yes , full code.   List the Names of Other Physician/Practitioners you currently use: Dr Dietrich Pates, Judy Pimple, [facility], Poditrist     Assessment:    Annual Wellness Exam   Plan:    During the course of the visit the patient was educated and counseled about appropriate screening and preventive services including:  A healthy diet is rich in fruit, vegetables and whole grains. Poultry fish, nuts and beans are a healthy choice for protein rather then red meat. A low sodium diet and drinking 64 ounces of water daily is generally recommended. Oils and sweet should be limited. Carbohydrates especially for those who are diabetic or overweight, should be limited to 30-45 gram per meal. It is important to eat on a regular schedule, at least 3 times daily. Snacks should be primarily fruits, vegetables or nuts. It is important that you exercise regularly at least 30  minutes 5 times a week. If you develop chest pain, have severe difficulty breathing, or feel very tired, stop exercising immediately and seek medical attention  Immunization reviewed and updated. Cancer screening reviewed and updated    Patient Instructions (the written plan) was given to the patient.  Medicare Attestation  I have personally reviewed:  The  patient's medical and social history  Their use of alcohol, tobacco or illicit drugs  Their current medications and supplements  The patient's functional ability including ADLs,fall risks, home safety risks, cognitive, and hearing and visual impairment  Diet and physical activities  Evidence for depression or mood disorders  The patient's weight, height, BMI, and visual acuity have been recorded in the chart. I have made referrals, counseling, and provided education to the patient based on review of the above and I have provided the patient with a written personalized care plan for preventive services.     Review of Systems     Objective:   Physical Exam        Assessment & Plan:

## 2012-05-13 NOTE — Patient Instructions (Addendum)
F/u in 4 month, please call if you need me before.   You did extremely well on your annual exam, you are blessed to be extremely functional  Keep as active as possible, and continue to exercise caution so you do not fall.  No changes in medication as on current list   You need the shingles vaccine, please check at the pharmacy to see if you decide to get it based on cost  Fasting lipid, cmp and tSH in 4 month

## 2012-05-14 ENCOUNTER — Other Ambulatory Visit (HOSPITAL_COMMUNITY): Payer: Medicare Other

## 2012-05-14 DIAGNOSIS — Z0000 Encounter for general adult medical examination without abnormal findings: Secondary | ICD-10-CM | POA: Insufficient documentation

## 2012-05-14 NOTE — Assessment & Plan Note (Signed)
Annual wellness exam performed and documented. [name] is limited by arthritic disease and impaired hearing, but remains very functional and engaged in life. He will get one hearing aid in the near future. His daughter oversees his care and is extremely diligent and involved. He is a full code and any procedure felt to improve his quality of life or extend is desired by her. Though a high fall risk, he uses an assistive device and has not fallen in the past year Zostavax will be obtained if covered adequately by his insurance company

## 2012-05-18 ENCOUNTER — Ambulatory Visit (HOSPITAL_COMMUNITY): Payer: Medicare Other | Admitting: Oncology

## 2012-05-26 ENCOUNTER — Encounter (HOSPITAL_COMMUNITY): Payer: Medicare Other | Attending: Oncology

## 2012-05-26 DIAGNOSIS — D509 Iron deficiency anemia, unspecified: Secondary | ICD-10-CM | POA: Insufficient documentation

## 2012-05-26 LAB — CBC
HCT: 39.5 % (ref 39.0–52.0)
Hemoglobin: 12.6 g/dL — ABNORMAL LOW (ref 13.0–17.0)
MCH: 29.7 pg (ref 26.0–34.0)
MCHC: 31.9 g/dL (ref 30.0–36.0)
MCV: 93.2 fL (ref 78.0–100.0)
Platelets: 174 10*3/uL (ref 150–400)
RBC: 4.24 MIL/uL (ref 4.22–5.81)
RDW: 14.8 % (ref 11.5–15.5)
WBC: 6.4 10*3/uL (ref 4.0–10.5)

## 2012-05-26 NOTE — Progress Notes (Signed)
Labs drawn today for cbc,ferr 

## 2012-05-27 ENCOUNTER — Encounter (HOSPITAL_BASED_OUTPATIENT_CLINIC_OR_DEPARTMENT_OTHER): Payer: Medicare Other | Admitting: Oncology

## 2012-05-27 ENCOUNTER — Encounter (HOSPITAL_BASED_OUTPATIENT_CLINIC_OR_DEPARTMENT_OTHER): Payer: Medicare Other

## 2012-05-27 VITALS — BP 113/76 | HR 86 | Temp 97.9°F | Resp 20 | Wt 240.0 lb

## 2012-05-27 DIAGNOSIS — D509 Iron deficiency anemia, unspecified: Secondary | ICD-10-CM

## 2012-05-27 DIAGNOSIS — J449 Chronic obstructive pulmonary disease, unspecified: Secondary | ICD-10-CM

## 2012-05-27 DIAGNOSIS — K639 Disease of intestine, unspecified: Secondary | ICD-10-CM

## 2012-05-27 DIAGNOSIS — D500 Iron deficiency anemia secondary to blood loss (chronic): Secondary | ICD-10-CM

## 2012-05-27 LAB — FERRITIN: Ferritin: 19 ng/mL — ABNORMAL LOW (ref 22–322)

## 2012-05-27 MED ORDER — SODIUM CHLORIDE 0.9 % IJ SOLN
10.0000 mL | INTRAMUSCULAR | Status: DC | PRN
  Administered 2012-05-27: 10 mL via INTRAVENOUS
  Filled 2012-05-27: qty 10

## 2012-05-27 MED ORDER — SODIUM CHLORIDE 0.9 % IV SOLN
1020.0000 mg | Freq: Once | INTRAVENOUS | Status: AC
  Administered 2012-05-27: 1020 mg via INTRAVENOUS
  Filled 2012-05-27: qty 34

## 2012-05-27 MED ORDER — SODIUM CHLORIDE 0.9 % IV SOLN
INTRAVENOUS | Status: DC
  Administered 2012-05-27: 11:00:00 via INTRAVENOUS

## 2012-05-27 NOTE — Progress Notes (Signed)
Leonard Overman, MD [address] [place] [address]  1. Anemia, iron deficiency  DISCONTINUED: ferumoxytol (FERAHEME) 1,020 mg in sodium chloride 0.9 % 100 mL IVPB    CURRENT THERAPY: Feraheme 1020 mg every 3-4 weeks.   INTERVAL HISTORY: [name] 78 y.o. male returns for  regular  visit for followup of iron deficiency anemia requiring IV support secondary to poor absorption but more importantly GI blood losses.  [name] is not on his Aspirin or Plavix.  This was discontinued prior to his Colonoscopy and EGD on 03/11/2012.  He has cecal AVMs and 2/3 of these were clipped, but this is not to be considered definitive treatment.  [name] required 2 units PRBCs on 03/21/2012 when he was admitted to the [facility] with GI bleed secondary to cecal AVMs.  I personally reviewed and went over laboratory results with the patient.  His Hgb is solid at 12.6, but his ferritin is low at 19.  So we will give him 1020 mg of Feraheme IV today.  We will check his iron studies every 2 weeks and see him back in 6 weeks.  I have encouraged the patient to remain off of his ASA and Plavix.   He denies any complaints.  Complete ROS questioning is negative.     Past Medical History  Diagnosis Date  . Mitral valve prolapse 2003    MVR in 2003  . Obesity   . Osteoarthritis   . Hypertension   . Hyperlipidemia   . COPD (chronic obstructive pulmonary disease)   . CHF (congestive heart failure) diastolic 10/2010    CXR in 12/2010: Prior CABG, cardiomegaly, vascular redistribution, bibasilar atelectasis, small effusions  . Pneumonia   . Impaired glucose tolerance   . Atrial fibrillation, chronic   . Prostate cancer     elevated PSA  . GERD (gastroesophageal reflux disease)     + hiatal hernia  . Arteriosclerotic cardiovascular disease (ASCVD) 1994    stent to RCA; CABG-2003  . Hard of hearing   . Anemia, iron deficiency   . Diabetes mellitus, type II   . AAA (abdominal aortic  aneurysm)     Fusiform; infrarenal; 4-4.1cm on CT in 07/2008 and 12/2010 by MRI  . Gastric ulcer 2004    2004; upper GI bleed  . Renal cysts, acquired, bilateral     Complex by MRI in 12/2010    has IMPAIRED GLUCOSE TOLERANCE; HYPERLIPIDEMIA; HYPERTENSION; KNEE, ARTHRITIS, DEGEN./OSTEO; Peripheral vascular disease; NSTEMI (non-ST elevated myocardial infarction); COPD (chronic obstructive pulmonary disease); Gait disturbance; Hearing loss; Anemia, iron deficiency; Chronic respiratory failure; Chronic diastolic CHF (congestive heart failure); Allergic rhinitis; Adenomatous polyps; Mitral valve prolapse; Obesity; Atrial fibrillation, chronic; Arteriosclerotic cardiovascular disease (ASCVD); Gastric ulcer; GERD (gastroesophageal reflux disease); Prostate cancer; Aorta aneurysm; and Routine general medical examination at a health care facility on his problem list.     is allergic to gabapentin.  [name] does not currently have medications on file.  Past Surgical History  Procedure Date  . Knee surgery     Left  . Bladder surgery   . Femoral artery stent 12-06-10    Left SFA  . Appendectomy   . Mitral valve replacement (mvr)/coronary artery bypass grafting (cabg) 2003    stent to RCA in 1994  . Eye surgery 2007    bilateral cataracts  . Tonsillectomy     thinks they were removed while in the [employer]  . Mass excision 06/07/2011    Procedure:  EXCISION MASS;  Surgeon: Fabio Bering;  Location: [facility];  Service: General;  Laterality: N/A;  excision of 2 masses back and buttocks  . Back surgery   . Esophagogastroduodenoscopy 03/03/2003    Large, deep prepyloric ulcer, as described above without bleeding/ Normal esophagus  . Esophagogastroduodenoscopy 09/03/2004    normal throughout  . Colonoscopy 09/03/2004    small ulcer, without stigmata of bleeding  . Esophagogastroduodenoscopy 11/14/2004    Normal esophagus and small hiatal hernia, otherwise normal stomach   . Colonoscopy 11/14/2004     Internal hemorrhoids, otherwise normal rectum/ left-sided diverticula , diffusely oozing right colon mucosa without a discrete lesion amenable to endoscopic therapy. 2 diminutive polyps. FELT TO HAVE AVMs/telangiectasias  . Colonoscopy 01/29/2005    Normal rectum  . Colonoscopy 03/11/2012    Colonic diverticulosis. Colonic polyps-removed as described above. Vascular anomalies in the cecum likely representing hemangiomas. Status post hemostasis clipping of  2 of the 3. ADENOMATOUS POLYPS. Repeat 2016  . Esophagogastroduodenoscopy 03/11/2012    Deformity of the antrum;  small polyp in antrum-not manipulated. Otherwise normal exam    Denies any headaches, dizziness, double vision, fevers, chills, night sweats, nausea, vomiting, diarrhea, constipation, chest pain, heart palpitations, shortness of breath, blood in stool, black tarry stool, urinary pain, urinary burning, urinary frequency, hematuria.   PHYSICAL EXAMINATION  ECOG PERFORMANCE STATUS: 3 - Symptomatic, >50% confined to bed  Filed Vitals:   05/27/12 0900  BP: 113/76  Pulse: 86  Temp: 97.9 F (36.6 C)  Resp: 20    GENERAL:alert, no distress, well nourished, well developed, comfortable, cooperative, obese, smiling and in wheelchair with O2 via nasal cannula. SKIN: skin color, texture, turgor are normal, no rashes or significant lesions HEAD: Normocephalic, No masses, lesions, tenderness or abnormalities EYES: normal, Conjunctiva are pink and non-injected EARS: External ears normal OROPHARYNX:mucous membranes are moist  NECK: supple, trachea midline LYMPH:  not examined BREAST:not examined LUNGS: clear to auscultation  HEART: regular rate & rhythm, no murmurs, no gallops, S1 normal and S2 normal ABDOMEN:abdomen soft, non-tender and obese BACK: Back symmetric, no curvature. EXTREMITIES:less then 2 second capillary refill, no joint deformities, effusion, or inflammation, no edema, no skin discoloration, no cyanosis  NEURO:  alert & oriented x 3 with fluent speech, no focal motor/sensory deficits   LABORATORY DATA:. CBC    Component Value Date/Time   WBC 6.4 05/26/2012 1117   RBC 4.24 05/26/2012 1117   HGB 12.6* 05/26/2012 1117   HCT 39.5 05/26/2012 1117   PLT 174 05/26/2012 1117   MCV 93.2 05/26/2012 1117   MCH 29.7 05/26/2012 1117   MCHC 31.9 05/26/2012 1117   RDW 14.8 05/26/2012 1117   LYMPHSABS 0.9 03/21/2012 1545   MONOABS 1.7* 03/21/2012 1545   EOSABS 0.1 03/21/2012 1545   BASOSABS 0.0 03/21/2012 1545    Lab Results  Component Value Date   IRON 100 04/13/2012   TIBC 298 04/13/2012   FERRITIN 19* 05/26/2012      ASSESSMENT:  1. Iron deficiency anemia requiring IV support secondary to poor absorption but more importantly GI blood losses. 2. Vascular disease 3. Aneurysm of chest and abdomen 4. O2 Dependent COPD 5. ECOG status of 3-4 6. Cecal AVMs   PLAN:  1. I personally reviewed and went over laboratory results with the patient. 2. Continue to hold ASA and Plavix 3. Lab work every 2 weeks: CBC, ferritin, retic count 4. Feraheme 1020 mg IV today 5. Return in 6 weeks for follow-up.   All  questions were answered. The patient knows to call the clinic with any problems, questions or concerns. We can certainly see the patient much sooner if necessary.  The patient and plan discussed with [name], MD and he is in agreement with the aforementioned.  Renea Schoonmaker

## 2012-05-27 NOTE — Progress Notes (Signed)
Tolerated iron infusion well. 

## 2012-05-27 NOTE — Patient Instructions (Addendum)
[facility] Discharge Instructions  RECOMMENDATIONS MADE BY THE CONSULTANT AND ANY TEST RESULTS WILL BE SENT TO YOUR REFERRING PHYSICIAN.  Feraheme infusion today. We will check lab work every 2 weeks. Return to clinic in 6 weeks to see MD.  Thank you for choosing [facility] to provide your oncology and hematology care.  To afford each patient quality time with our providers, please arrive at least 15 minutes before your scheduled appointment time.  With your help, our goal is to use those 15 minutes to complete the necessary work-up to ensure our physicians have the information they need to help with your evaluation and healthcare recommendations.    Effective January 1st, 2014, we ask that you re-schedule your appointment with our physicians should you arrive 10 or more minutes late for your appointment.  We strive to give you quality time with our providers, and arriving late affects you and other patients whose appointments are after yours.    Again, thank you for choosing  [facility].  Our hope is that these requests will decrease the amount of time that you wait before being seen by our physicians.       _____________________________________________________________  I acknowledge that I have been informed and understand all the instructions given to me and received a copy. I do not have anymore questions at this time but understand that I may call the Cancer Center at [facility] at [phone] during business hours should I have any further questions or need assistance in obtaining follow-up care.    __________________________________________  _____________  __________ Signature of Patient or Authorized Representative            Date                   Time    __________________________________________ Nurse's Signature

## 2012-06-04 ENCOUNTER — Other Ambulatory Visit: Payer: Self-pay | Admitting: Family Medicine

## 2012-06-10 ENCOUNTER — Other Ambulatory Visit (HOSPITAL_COMMUNITY): Payer: Medicare Other

## 2012-06-10 NOTE — Progress Notes (Signed)
UR Chart Review Completed  

## 2012-06-15 ENCOUNTER — Encounter (HOSPITAL_BASED_OUTPATIENT_CLINIC_OR_DEPARTMENT_OTHER): Payer: Medicare Other

## 2012-06-15 DIAGNOSIS — D500 Iron deficiency anemia secondary to blood loss (chronic): Secondary | ICD-10-CM

## 2012-06-15 DIAGNOSIS — D509 Iron deficiency anemia, unspecified: Secondary | ICD-10-CM

## 2012-06-15 LAB — CBC
Hemoglobin: 13.7 g/dL (ref 13.0–17.0)
MCHC: 31.9 g/dL (ref 30.0–36.0)
Platelets: 193 10*3/uL (ref 150–400)
RBC: 4.49 MIL/uL (ref 4.22–5.81)

## 2012-06-15 LAB — FERRITIN: Ferritin: 213 ng/mL (ref 22–322)

## 2012-06-15 LAB — RETICULOCYTES: Retic Ct Pct: 2.2 % (ref 0.4–3.1)

## 2012-06-15 NOTE — Progress Notes (Signed)
Labs drawn today for cbc,ferr,retic 

## 2012-06-24 DIAGNOSIS — R58 Hemorrhage, not elsewhere classified: Secondary | ICD-10-CM

## 2012-06-24 HISTORY — DX: Hemorrhage, not elsewhere classified: R58

## 2012-06-25 ENCOUNTER — Other Ambulatory Visit (HOSPITAL_COMMUNITY): Payer: Medicare Other

## 2012-07-02 ENCOUNTER — Other Ambulatory Visit: Payer: Self-pay

## 2012-07-02 MED ORDER — ALBUTEROL SULFATE (2.5 MG/3ML) 0.083% IN NEBU: 2.5000 mg | INHALATION_SOLUTION | Freq: Four times a day (QID) | RESPIRATORY_TRACT | Status: DC | PRN

## 2012-07-08 ENCOUNTER — Encounter (HOSPITAL_COMMUNITY): Payer: Medicare Other | Attending: Oncology

## 2012-07-08 DIAGNOSIS — D509 Iron deficiency anemia, unspecified: Secondary | ICD-10-CM

## 2012-07-08 DIAGNOSIS — D500 Iron deficiency anemia secondary to blood loss (chronic): Secondary | ICD-10-CM

## 2012-07-08 LAB — CBC
MCHC: 32.1 g/dL (ref 30.0–36.0)
MCV: 97.1 fL (ref 78.0–100.0)
Platelets: 159 10*3/uL (ref 150–400)
RDW: 15.6 % — ABNORMAL HIGH (ref 11.5–15.5)
WBC: 7.2 10*3/uL (ref 4.0–10.5)

## 2012-07-08 LAB — RETICULOCYTES
Retic Count, Absolute: 62 10*3/uL (ref 19.0–186.0)
Retic Ct Pct: 1.4 % (ref 0.4–3.1)

## 2012-07-08 NOTE — Progress Notes (Signed)
Labs drawn today for cbc,ferr,retic

## 2012-07-09 ENCOUNTER — Other Ambulatory Visit (HOSPITAL_COMMUNITY): Payer: Self-pay | Admitting: Oncology

## 2012-07-10 ENCOUNTER — Encounter (HOSPITAL_BASED_OUTPATIENT_CLINIC_OR_DEPARTMENT_OTHER): Payer: Medicare Other

## 2012-07-10 VITALS — BP 116/73 | HR 72 | Resp 18

## 2012-07-10 DIAGNOSIS — D509 Iron deficiency anemia, unspecified: Secondary | ICD-10-CM

## 2012-07-10 MED ORDER — SODIUM CHLORIDE 0.9 % IV SOLN
1020.0000 mg | Freq: Once | INTRAVENOUS | Status: AC
  Administered 2012-07-10: 1020 mg via INTRAVENOUS
  Filled 2012-07-10: qty 34

## 2012-07-10 MED ORDER — SODIUM CHLORIDE 0.9 % IV SOLN
INTRAVENOUS | Status: DC
  Administered 2012-07-10: 14:00:00 via INTRAVENOUS

## 2012-07-10 NOTE — Progress Notes (Signed)
[name] tolerated infusion well and without incident; verbalizes understanding for follow-up.  No distress noted at time of discharge and patient was discharged home with his daughter.

## 2012-07-12 ENCOUNTER — Emergency Department (HOSPITAL_COMMUNITY): Payer: Medicare Other

## 2012-07-12 ENCOUNTER — Encounter (HOSPITAL_COMMUNITY): Payer: Self-pay | Admitting: *Deleted

## 2012-07-12 ENCOUNTER — Inpatient Hospital Stay (HOSPITAL_COMMUNITY)
Admission: EM | Admit: 2012-07-12 | Discharge: 2012-07-15 | DRG: 193 | Disposition: A | Discharge status: Home Health | Payer: Medicare Other | Attending: Internal Medicine | Admitting: Internal Medicine

## 2012-07-12 ENCOUNTER — Other Ambulatory Visit: Payer: Self-pay

## 2012-07-12 DIAGNOSIS — R7309 Other abnormal glucose: Secondary | ICD-10-CM | POA: Diagnosis present

## 2012-07-12 DIAGNOSIS — IMO0002 Reserved for concepts with insufficient information to code with codable children: Secondary | ICD-10-CM

## 2012-07-12 DIAGNOSIS — I4891 Unspecified atrial fibrillation: Secondary | ICD-10-CM | POA: Diagnosis present

## 2012-07-12 DIAGNOSIS — I5032 Chronic diastolic (congestive) heart failure: Secondary | ICD-10-CM

## 2012-07-12 DIAGNOSIS — I252 Old myocardial infarction: Secondary | ICD-10-CM

## 2012-07-12 DIAGNOSIS — I714 Abdominal aortic aneurysm, without rupture, unspecified: Secondary | ICD-10-CM | POA: Diagnosis present

## 2012-07-12 DIAGNOSIS — J189 Pneumonia, unspecified organism: Principal | ICD-10-CM | POA: Diagnosis present

## 2012-07-12 DIAGNOSIS — Z954 Presence of other heart-valve replacement: Secondary | ICD-10-CM

## 2012-07-12 DIAGNOSIS — Z951 Presence of aortocoronary bypass graft: Secondary | ICD-10-CM

## 2012-07-12 DIAGNOSIS — I059 Rheumatic mitral valve disease, unspecified: Secondary | ICD-10-CM | POA: Diagnosis present

## 2012-07-12 DIAGNOSIS — J449 Chronic obstructive pulmonary disease, unspecified: Secondary | ICD-10-CM

## 2012-07-12 DIAGNOSIS — Z87891 Personal history of nicotine dependence: Secondary | ICD-10-CM

## 2012-07-12 DIAGNOSIS — E785 Hyperlipidemia, unspecified: Secondary | ICD-10-CM

## 2012-07-12 DIAGNOSIS — J9610 Chronic respiratory failure, unspecified whether with hypoxia or hypercapnia: Secondary | ICD-10-CM

## 2012-07-12 DIAGNOSIS — T380X5A Adverse effect of glucocorticoids and synthetic analogues, initial encounter: Secondary | ICD-10-CM

## 2012-07-12 DIAGNOSIS — K219 Gastro-esophageal reflux disease without esophagitis: Secondary | ICD-10-CM

## 2012-07-12 DIAGNOSIS — J309 Allergic rhinitis, unspecified: Secondary | ICD-10-CM

## 2012-07-12 DIAGNOSIS — D508 Other iron deficiency anemias: Secondary | ICD-10-CM

## 2012-07-12 DIAGNOSIS — I341 Nonrheumatic mitral (valve) prolapse: Secondary | ICD-10-CM

## 2012-07-12 DIAGNOSIS — J9600 Acute respiratory failure, unspecified whether with hypoxia or hypercapnia: Secondary | ICD-10-CM

## 2012-07-12 DIAGNOSIS — Z0000 Encounter for general adult medical examination without abnormal findings: Secondary | ICD-10-CM

## 2012-07-12 DIAGNOSIS — I5043 Acute on chronic combined systolic (congestive) and diastolic (congestive) heart failure: Secondary | ICD-10-CM | POA: Diagnosis present

## 2012-07-12 DIAGNOSIS — D509 Iron deficiency anemia, unspecified: Secondary | ICD-10-CM

## 2012-07-12 DIAGNOSIS — C61 Malignant neoplasm of prostate: Secondary | ICD-10-CM

## 2012-07-12 DIAGNOSIS — R339 Retention of urine, unspecified: Secondary | ICD-10-CM | POA: Diagnosis present

## 2012-07-12 DIAGNOSIS — I2789 Other specified pulmonary heart diseases: Secondary | ICD-10-CM | POA: Diagnosis present

## 2012-07-12 DIAGNOSIS — J9620 Acute and chronic respiratory failure, unspecified whether with hypoxia or hypercapnia: Secondary | ICD-10-CM | POA: Diagnosis present

## 2012-07-12 DIAGNOSIS — I509 Heart failure, unspecified: Secondary | ICD-10-CM | POA: Diagnosis present

## 2012-07-12 DIAGNOSIS — I2510 Atherosclerotic heart disease of native coronary artery without angina pectoris: Secondary | ICD-10-CM

## 2012-07-12 DIAGNOSIS — Z79899 Other long term (current) drug therapy: Secondary | ICD-10-CM

## 2012-07-12 DIAGNOSIS — I482 Chronic atrial fibrillation, unspecified: Secondary | ICD-10-CM

## 2012-07-12 DIAGNOSIS — R269 Unspecified abnormalities of gait and mobility: Secondary | ICD-10-CM

## 2012-07-12 DIAGNOSIS — M1710 Unilateral primary osteoarthritis, unspecified knee: Secondary | ICD-10-CM

## 2012-07-12 DIAGNOSIS — E739 Lactose intolerance, unspecified: Secondary | ICD-10-CM

## 2012-07-12 DIAGNOSIS — Z6832 Body mass index (BMI) 32.0-32.9, adult: Secondary | ICD-10-CM

## 2012-07-12 DIAGNOSIS — R739 Hyperglycemia, unspecified: Secondary | ICD-10-CM | POA: Diagnosis not present

## 2012-07-12 DIAGNOSIS — E669 Obesity, unspecified: Secondary | ICD-10-CM

## 2012-07-12 DIAGNOSIS — J441 Chronic obstructive pulmonary disease with (acute) exacerbation: Secondary | ICD-10-CM

## 2012-07-12 DIAGNOSIS — I719 Aortic aneurysm of unspecified site, without rupture: Secondary | ICD-10-CM

## 2012-07-12 DIAGNOSIS — I739 Peripheral vascular disease, unspecified: Secondary | ICD-10-CM

## 2012-07-12 DIAGNOSIS — H9190 Unspecified hearing loss, unspecified ear: Secondary | ICD-10-CM

## 2012-07-12 HISTORY — DX: Essential (primary) hypertension: I10

## 2012-07-12 HISTORY — DX: Non-ST elevation (NSTEMI) myocardial infarction: I21.4

## 2012-07-12 HISTORY — DX: Benign neoplasm, unspecified site: D36.9

## 2012-07-12 LAB — CBC WITH DIFFERENTIAL/PLATELET
Basophils Relative: 0 % (ref 0–1)
Hemoglobin: 13.7 g/dL (ref 13.0–17.0)
MCHC: 31.9 g/dL (ref 30.0–36.0)
Monocytes Relative: 4 % (ref 3–12)
Neutro Abs: 14.3 10*3/uL — ABNORMAL HIGH (ref 1.7–7.7)
Neutrophils Relative %: 93 % — ABNORMAL HIGH (ref 43–77)
RBC: 4.39 MIL/uL (ref 4.22–5.81)

## 2012-07-12 LAB — BASIC METABOLIC PANEL
BUN: 18 mg/dL (ref 6–23)
Chloride: 102 mEq/L (ref 96–112)
GFR calc Af Amer: 86 mL/min — ABNORMAL LOW (ref >90)
Potassium: 4.3 mEq/L (ref 3.5–5.1)
Sodium: 140 mEq/L (ref 135–145)

## 2012-07-12 LAB — BLOOD GAS, ARTERIAL
Bicarbonate: 25.8 mEq/L — ABNORMAL HIGH (ref 20.0–24.0)
Expiratory PAP: 6
Inspiratory PAP: 12
pCO2 arterial: 49.4 mmHg — ABNORMAL HIGH (ref 35.0–45.0)
pH, Arterial: 7.338 — ABNORMAL LOW (ref 7.350–7.450)
pO2, Arterial: 127 mmHg — ABNORMAL HIGH (ref 80.0–100.0)

## 2012-07-12 LAB — URINALYSIS, ROUTINE W REFLEX MICROSCOPIC
Glucose, UA: NEGATIVE mg/dL
Leukocytes, UA: NEGATIVE
Nitrite: NEGATIVE
Protein, ur: 100 mg/dL — AB
Urobilinogen, UA: 0.2 mg/dL (ref 0.0–1.0)

## 2012-07-12 LAB — PRO B NATRIURETIC PEPTIDE: Pro B Natriuretic peptide (BNP): 1668 pg/mL — ABNORMAL HIGH (ref 0–450)

## 2012-07-12 LAB — TROPONIN I: Troponin I: <0.3 ng/mL (ref <0.30)

## 2012-07-12 LAB — URINE MICROSCOPIC-ADD ON

## 2012-07-12 MED ORDER — DEXTROSE 5 % IV SOLN
1.0000 g | INTRAVENOUS | Status: DC
  Administered 2012-07-12 – 2012-07-13 (×2): 1 g via INTRAVENOUS
  Filled 2012-07-12 (×2): qty 10

## 2012-07-12 MED ORDER — METHYLPREDNISOLONE SODIUM SUCC 125 MG IJ SOLR
60.0000 mg | Freq: Every day | INTRAMUSCULAR | Status: DC
  Administered 2012-07-13: 60 mg via INTRAVENOUS
  Filled 2012-07-12: qty 2

## 2012-07-12 MED ORDER — ALBUTEROL SULFATE (5 MG/ML) 0.5% IN NEBU
5.0000 mg | INHALATION_SOLUTION | Freq: Once | RESPIRATORY_TRACT | Status: AC
  Administered 2012-07-12: 5 mg via RESPIRATORY_TRACT
  Filled 2012-07-12: qty 1

## 2012-07-12 MED ORDER — FUROSEMIDE 10 MG/ML IJ SOLN
40.0000 mg | Freq: Once | INTRAMUSCULAR | Status: AC
  Administered 2012-07-12: 40 mg via INTRAVENOUS
  Filled 2012-07-12: qty 4

## 2012-07-12 MED ORDER — DEXTROSE 5 % IV SOLN
30.0000 ug/min | INTRAVENOUS | Status: DC
  Filled 2012-07-12: qty 1

## 2012-07-12 MED ORDER — LEVOFLOXACIN IN D5W 750 MG/150ML IV SOLN
750.0000 mg | INTRAVENOUS | Status: DC
  Administered 2012-07-13 (×2): 750 mg via INTRAVENOUS
  Filled 2012-07-12: qty 150

## 2012-07-12 MED ORDER — DILTIAZEM HCL 100 MG IV SOLR
5.0000 mg/h | INTRAVENOUS | Status: DC
  Administered 2012-07-12: 5 mg/h via INTRAVENOUS
  Filled 2012-07-12: qty 100

## 2012-07-12 MED ORDER — METHYLPREDNISOLONE SODIUM SUCC 125 MG IJ SOLR
125.0000 mg | Freq: Once | INTRAMUSCULAR | Status: DC
  Filled 2012-07-12: qty 2

## 2012-07-12 MED ORDER — IPRATROPIUM BROMIDE 0.02 % IN SOLN
0.5000 mg | Freq: Once | RESPIRATORY_TRACT | Status: AC
  Administered 2012-07-12: 0.5 mg via RESPIRATORY_TRACT
  Filled 2012-07-12: qty 2.5

## 2012-07-12 NOTE — H&P (Signed)
[facility] History and Physical  [name] UJW:[id number] DOB: [birth year] DOA: 07/12/2012  Referring physician: EDP cook PCP: [name], MD  Specialists: None  Chief Complaint: Dyspnea  HPI: [name] is a 79 y.o. male with multiple end stage chronic medical problems including COPD, CHF, ASCVD, AAA, DM and chronic AVM related blood loss anemia who presents to ED with acute onset SOB. His daughter is his primary caregiver and checked his O2 sat with portable device at home and it read 83 and he was symptomatic with minimal exertion. New worsening cough, minimally productive, no fevers, chill, no NVD, weaker than usual. No focal neurological deficits or complaints. No sick contacts. Several prior hospitalization with similar presentation, actually was intubated about 2 years ago according to his daughter at bedside. He is on Home O2, and takes his medications as prescribed.   Review of Systems:  Other than symptoms attributable to the present illness and past history as described above, general review of systems is otherwise negative. GENERAL: No change in appetite, energy or weight. No fever, chills or sweats.  HEMATOLOGIC: No history of anemia, easy bruising, bleeding or clotting disorders. HEAD AND NECK: No auditory or visual complaints. No tinnitus. No pharyngeal complaints. RESPIRATORY:as above CARDIOVASCULAR: As above. GASTROINTESTINAL: No dysphagia, odynophagia, acid regurgitation, heartburn, indigestion, diarrhea or constipation. No history of nausea or vomiting. No change in appearance, frequency or consistency of bowel movements. MUSCULOSKELETAL: No myalgias, arthralgias, joint swelling. NEUROLOGICAL: No vertigo or disturbance of balance or coordination. No motor or sensory complaints. GENITOURINARY: +hestancy.No dysuria, urgency or urinary frequency. No incontinence. No history of kidney problems. DERMATOLOGIC: No complaint of skin lesions or rashes. No change in  nails or hair. PSYCHIATRIC: No noted disturbance in mood or cognitive function.   Past Medical History  Diagnosis Date  . Mitral valve prolapse 2003    MVR in 2003  . Obesity   . Osteoarthritis   . Hypertension   . Hyperlipidemia   . COPD (chronic obstructive pulmonary disease)   . CHF (congestive heart failure) diastolic 10/2010    CXR in 12/2010: Prior CABG, cardiomegaly, vascular redistribution, bibasilar atelectasis, small effusions  . Pneumonia   . Impaired glucose tolerance   . Atrial fibrillation, chronic   . Prostate cancer     elevated PSA  . GERD (gastroesophageal reflux disease)     + hiatal hernia  . Arteriosclerotic cardiovascular disease (ASCVD) 1994    stent to RCA; CABG-2003  . Hard of hearing   . Anemia, iron deficiency   . Diabetes mellitus, type II   . AAA (abdominal aortic aneurysm)     Fusiform; infrarenal; 4-4.1cm on CT in 07/2008 and 12/2010 by MRI  . Gastric ulcer 2004    2004; upper GI bleed  . Renal cysts, acquired, bilateral     Complex by MRI in 12/2010  . Adenomatous polyps 04/07/2012  . HYPERTENSION 12/17/2007    Subsequently hypotensive and all antihypertensive medication discontinued Lab  03/2012: Mild anemia with H&H-11.8/37.5, MCV-97, ferritin-224, normal CMet ex G-124, alb-3.4   . NSTEMI (non-ST elevated myocardial infarction) 01/18/2011   Past Surgical History  Procedure Date  . Knee surgery     Left  . Bladder surgery   . Femoral artery stent 12-06-10    Left SFA  . Appendectomy   . Mitral valve replacement (mvr)/coronary artery bypass grafting (cabg) 2003    stent to RCA in 1994  . Eye surgery 2007    bilateral cataracts  . Tonsillectomy  thinks they were removed while in the [employer]  . Mass excision 06/07/2011    Procedure: EXCISION MASS;  Surgeon: Fabio Bering;  Location: [facility];  Service: General;  Laterality: N/A;  excision of 2 masses back and buttocks  . Back surgery   . Esophagogastroduodenoscopy 03/03/2003    Large,  deep prepyloric ulcer, as described above without bleeding/ Normal esophagus  . Esophagogastroduodenoscopy 09/03/2004    normal throughout  . Colonoscopy 09/03/2004    small ulcer, without stigmata of bleeding  . Esophagogastroduodenoscopy 11/14/2004    Normal esophagus and small hiatal hernia, otherwise normal stomach   . Colonoscopy 11/14/2004    Internal hemorrhoids, otherwise normal rectum/ left-sided diverticula , diffusely oozing right colon mucosa without a discrete lesion amenable to endoscopic therapy. 2 diminutive polyps. FELT TO HAVE AVMs/telangiectasias  . Colonoscopy 01/29/2005    Normal rectum  . Colonoscopy 03/11/2012    Colonic diverticulosis. Colonic polyps-removed as described above. Vascular anomalies in the cecum likely representing hemangiomas. Status post hemostasis clipping of  2 of the 3. ADENOMATOUS POLYPS. Repeat 2016  . Esophagogastroduodenoscopy 03/11/2012    Deformity of the antrum;  small polyp in antrum-not manipulated. Otherwise normal exam   Social History:  reports that he quit smoking about 5 years ago. His smoking use included Cigarettes. He has a 70 pack-year smoking history. He has quit using smokeless tobacco. He reports that he does not drink alcohol or use illicit drugs. Has 3 children living. 1 son deceased at age 79 due to drunk driving. Widow since 1966, lives with a daughter who is responsible for his care Quit smoking in 2010, no alchol for over 10 years though he "loves it" Retired from [employer], after 10 years in 1951. 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 Gabapentin     Patient cannot take over 200 mg dose at a time as it causes jerking or spasms    Family History  Problem Relation Age of Onset  . Stroke Mother   . Stroke Brother   . Heart attack Brother   . Cancer Brother   . Cancer Brother   . Anesthesia problems Neg Hx   . Hypotension Neg Hx   . Malignant hyperthermia Neg Hx   .  Pseudochol deficiency Neg Hx    Prior to Admission medications   Medication Sig Start Date End Date Taking? Authorizing Provider  albuterol (PROVENTIL) (2.5 MG/3ML) 0.083% nebulizer solution Take 3 mLs (2.5 mg total) by nebulization every 6 (six) hours as needed. Mixes with the Ipratropium(for shortness of breath) 07/02/12  Yes Kerri Perches, MD  Fluticasone-Salmeterol (ADVAIR) 100-50 MCG/DOSE AEPB Inhale 1 puff into the lungs every 12 (twelve) hours.   Yes Historical Provider, MD  furosemide (LASIX) 20 MG tablet Take 60 mg by mouth daily.   Yes Historical Provider, MD  ipratropium (ATROVENT) 0.02 % nebulizer solution Take 500 mcg by nebulization every 6 (six) hours as needed. Shortness of breath   Yes Historical Provider, MD  omeprazole (PRILOSEC) 40 MG capsule Take 40 mg by mouth daily.   Yes Historical Provider, MD  potassium chloride SA (K-DUR,KLOR-CON) 20 MEQ tablet Take 20 mEq by mouth 2 (two) times daily.   Yes Historical Provider, MD  lovastatin (MEVACOR) 40 MG tablet Take 40 mg by mouth at bedtime. Only takes on Sun, Mon, Wed, and Fri.    Historical Provider, MD  oxymetazoline (AFRIN) 0.05 % nasal spray Place  1 spray into the nose daily as needed. Congestion    Historical Provider, MD   Physical Exam: Filed Vitals:   07/12/12 2111 07/12/12 2130 07/12/12 2200 07/12/12 2300  BP: 114/95 117/56 79/54 124/65  Pulse: 109 29 72 78  Temp:      TempSrc:      Resp: 26 22 24 21   Height:      Weight:      SpO2: 96% 89% 87% 97%   General appearance: Pale, chronically ill appearing elderly gentleman on Bipap, Eyes: anicteric sclerae, moist conjunctivae; no lid-lag; PERRLA HENT: Atraumatic; oropharynx clear with dry mucous membranes and no mucosal ulcerations; normal hard and soft palate Neck: Trachea midline; FROM, supple, no thyromegaly or lymphadenopathy Lungs: Scattered wheezes, no significant rhonchi, overall poor air movement into bases, BiPAP sounds limit exam  CV: IRIR, cannot  auscultate for murmur adequately with BiPAP Abdomen: Soft, non-tender; no masses or HSM Extremities: trace peripheral edema no extremity lymphadenopathy Skin:cool dry; no rash, ulcers or subcutaneous nodules Psych: Appropriate affect, alert and oriented to person, place and time   Labs on Admission:  Basic Metabolic Panel:  Lab 07/12/12 4098  NA 140  K 4.3  CL 102  CO2 27  GLUCOSE 154*  BUN 18  CREATININE 0.89  CALCIUM 9.6  MG --  PHOS --   Liver Function Tests: No results found for this basename: AST:5,ALT:5,ALKPHOS:5,BILITOT:5,PROT:5,ALBUMIN:5 in the last 168 hours No results found for this basename: LIPASE:5,AMYLASE:5 in the last 168 hours No results found for this basename: AMMONIA:5 in the last 168 hours CBC:  Lab 07/12/12 2033 07/08/12 1022  WBC 15.5* 7.2  NEUTROABS 14.3* --  HGB 13.7 13.8  HCT 43.0 43.0  MCV 97.9 97.1  PLT 159 159   Cardiac Enzymes:  Lab 07/12/12 2033  CKTOTAL --  CKMB --  CKMBINDEX --  TROPONINI <0.30    BNP (last 3 results)  Basename 07/12/12 2033 08/06/11 1030  PROBNP 1668.0* 2781.0*   CBG: No results found for this basename: GLUCAP:5 in the last 168 hours  Radiological Exams on Admission: Dg Chest Portable 1 View  07/12/2012  *RADIOLOGY REPORT*  Clinical Data: Shortness of breath.  PORTABLE CHEST - 1 VIEW  Comparison: 03/13/2012  Findings: Interstitial densities at the lung bases, right side greater than left.  Again noted is cardiomegaly and status post cardiac surgery.  There is mild peribronchial thickening.  IMPRESSION: Basilar atelectasis versus basilar interstitial edema.  Stable cardiomegaly.   Original Report Authenticated By: Richarda Overlie, M.D.     EKG: Independently reviewed. A-Fib with RVR rates 110's.  Assessment: 79 yo admitted with acute respiratory failure, likely multifactorial related to CHF exacerbation, COPD, a-Fib with RVR and possible early PNA. When I evaluated the patient he was severely hypotensive, but  repeat measurements indicate those were erroneous. His O2 sats on arrival were in low 80's he got lasix ,nebs and placed on Bi-Pap in ED. His blood gas was fairly normal. His BNP was elevated to suggest CHF component. He is afebrile but has an elevated WBC count, he has a chronic cough that has been worse over the past few days. Some mild wheezing on exam. He is a very chronically ill appearing gentleman but is AOX3, working hard to breathe.  Active Problems:   Acute respiratory failure   UTI, Pyuria with urinary retension   HYPERLIPIDEMIA   Peripheral vascular disease   COPD (chronic obstructive pulmonary disease)   Gait disturbance   Hearing loss   Anemia,  iron deficiency   Chronic respiratory failure   Chronic diastolic CHF (congestive heart failure)   Allergic rhinitis   Mitral valve prolapse   Atrial fibrillation, chronic   Arteriosclerotic cardiovascular disease (ASCVD)   GERD (gastroesophageal reflux disease)   Aorta aneurysm   Prostate Cancer, observation only  Plan:  1. Goals of Care:Had a goals of care discussion with patient and daughter in the ED given his critical condition on arrival and after my assessment. About two years ago he had a prolonged hospital course where he was intubated and was in ICU for 2 weeks, he recovered from this and according to his daughter and HCPOA everyone told her he was going to die. She reports that the patient would want "everything done to stay alive". I supported her wish to do that with medically reasonable options-I do not think this gentleman would survive this type on intervention-certainly he would not survive a CPR attempt especially given his many co-morbidities and progressively declining health. His daughter Avon Gully has agreed to a Limited Code to include full aggressive treatment of all potentially reversible conditions with any necessary means such as invasive procedures, IV medicatons, ICU care, pressors and  cardioversion if indicated. What we agreed not to do is not intubate him or do CPR should his heart stop. She may consider intubation for a reversible condition but for now no intubation and NIPPV as tolerated. 2. Diurese for CHF on CXR and elevated BNP-Strict I/O, may need to use a vasopressor like Neo-Synephrine to do this without him becoming hypotensive. 3. Empiric treatment for PNA: Rocephin and Levaquin 4. Empiric treatment for COPD exacerbation: Nebs, Steroids and O2 titration. 5. Will check blood cutures, Strep pna ang and legionella ang 6. UA shows 11-20 WBC, await culture, may have UTI fom retention, will start flomax-he has an elevated PSA in and probably prostate ca that has been untreated. 7. Known anemia, providers have stopped all of his antiplatelets and anticoagulants indefinitely. 8.   Protonix for GERD   Disposition Plan: D/C to home with his daughter when medically stable. Time spent: 70 minutes  [facility] Pager [phone]  If 7PM-7AM, please contact night-coverage [email or web address] Password [facility] 07/12/2012, 11:52 PM

## 2012-07-12 NOTE — ED Notes (Signed)
Respiratory called, pt's sats 83% on CPAP

## 2012-07-12 NOTE — ED Provider Notes (Signed)
History     CSN: [id number]  Arrival date & time 07/12/12  1944   First MD Initiated Contact with Patient 07/12/12 1939      Chief Complaint  Patient presents with  . Shortness of Breath    (Consider location/radiation/quality/duration/timing/severity/associated sxs/prior treatment) HPI....Marland Kitchenlevel V caveat for urgent need for intervention.  Patient has long-standing COPD and is on 3 L nasal cannula chronically at home. He has had several bouts of respiratory failure in the past which usually responds to BPAP.  Per daughter's history, he has never been intubated.  Increasingly short of breath today.  Transported to the hospital via EMS. Breathing treatment given, Solu-Medrol IV, BiPAP initiated.    Past Medical History  Diagnosis Date  . Mitral valve prolapse 2003    MVR in 2003  . Obesity   . Osteoarthritis   . Hypertension   . Hyperlipidemia   . COPD (chronic obstructive pulmonary disease)   . CHF (congestive heart failure) diastolic 10/2010    CXR in 12/2010: Prior CABG, cardiomegaly, vascular redistribution, bibasilar atelectasis, small effusions  . Pneumonia   . Impaired glucose tolerance   . Atrial fibrillation, chronic   . Prostate cancer     elevated PSA  . GERD (gastroesophageal reflux disease)     + hiatal hernia  . Arteriosclerotic cardiovascular disease (ASCVD) 1994    stent to RCA; CABG-2003  . Hard of hearing   . Anemia, iron deficiency   . Diabetes mellitus, type II   . AAA (abdominal aortic aneurysm)     Fusiform; infrarenal; 4-4.1cm on CT in 07/2008 and 12/2010 by MRI  . Gastric ulcer 2004    2004; upper GI bleed  . Renal cysts, acquired, bilateral     Complex by MRI in 12/2010    Past Surgical History  Procedure Date  . Knee surgery     Left  . Bladder surgery   . Femoral artery stent 12-06-10    Left SFA  . Appendectomy   . Mitral valve replacement (mvr)/coronary artery bypass grafting (cabg) 2003    stent to RCA in 1994  . Eye surgery 2007   bilateral cataracts  . Tonsillectomy     thinks they were removed while in the [employer]  . Mass excision 06/07/2011    Procedure: EXCISION MASS;  Surgeon: Fabio Bering;  Location: [facility];  Service: General;  Laterality: N/A;  excision of 2 masses back and buttocks  . Back surgery   . Esophagogastroduodenoscopy 03/03/2003    Large, deep prepyloric ulcer, as described above without bleeding/ Normal esophagus  . Esophagogastroduodenoscopy 09/03/2004    normal throughout  . Colonoscopy 09/03/2004    small ulcer, without stigmata of bleeding  . Esophagogastroduodenoscopy 11/14/2004    Normal esophagus and small hiatal hernia, otherwise normal stomach   . Colonoscopy 11/14/2004    Internal hemorrhoids, otherwise normal rectum/ left-sided diverticula , diffusely oozing right colon mucosa without a discrete lesion amenable to endoscopic therapy. 2 diminutive polyps. FELT TO HAVE AVMs/telangiectasias  . Colonoscopy 01/29/2005    Normal rectum  . Colonoscopy 03/11/2012    Colonic diverticulosis. Colonic polyps-removed as described above. Vascular anomalies in the cecum likely representing hemangiomas. Status post hemostasis clipping of  2 of the 3. ADENOMATOUS POLYPS. Repeat 2016  . Esophagogastroduodenoscopy 03/11/2012    Deformity of the antrum;  small polyp in antrum-not manipulated. Otherwise normal exam    Family History  Problem Relation Age of Onset  . Stroke Mother   . Stroke  Brother   . Heart attack Brother   . Cancer Brother   . Cancer Brother   . Anesthesia problems Neg Hx   . Hypotension Neg Hx   . Malignant hyperthermia Neg Hx   . Pseudochol deficiency Neg Hx     History  Substance Use Topics  . Smoking status: Former Smoker -- 1.0 packs/day for 70 years    Types: Cigarettes    Quit date: 06/25/2007  . Smokeless tobacco: Former Neurosurgeon  . Alcohol Use: No      Review of Systems  Unable to perform ROS: Other    Allergies  Gabapentin  Home Medications   Current  Outpatient Rx  Name  Route  Sig  Dispense  Refill  . ADVAIR DISKUS 100-50 MCG/DOSE IN AEPB      INHALE 1 PUFF BY MOUTH TWICE DAILY.   60 each   3   . ALBUTEROL SULFATE (2.5 MG/3ML) 0.083% IN NEBU   Nebulization   Take 3 mLs (2.5 mg total) by nebulization every 6 (six) hours as needed. Mixes with the Ipratropium(for shortness of breath)   300 mL   1   . FUROSEMIDE 20 MG PO TABS   Oral   Take 60 mg by mouth daily.         . IPRATROPIUM BROMIDE 0.02 % IN SOLN      INHALE 1 VIAL VIA NEBULIZER EVERY 6 HOURS AS NEEDED FOR SHORTNESS OF BREATH.   250 mL   2   . K-DUR 20 MEQ PO TBCR      TAKE (1) TABLET BY MOUTH TWICE DAILY.   60 tablet   3   . LOVASTATIN 40 MG PO TABS   Oral   Take 40 mg by mouth at bedtime. Only takes on Sun, Mon, Wed, and Fri.         Marland Kitchen OXYMETAZOLINE HCL 0.05 % NA SOLN   Nasal   Place 1 spray into the nose daily as needed. Congestion         . PRILOSEC 40 MG PO CPDR      TAKE ONE CAPSULE DAILY.   30 capsule   2     BP 142/79  Pulse 82  Temp 98 F (36.7 C) (Oral)  Ht 6\' 1"  (1.854 m)  Wt 250 lb (113.399 kg)  BMI 32.98 kg/m2  SpO2 90%  Physical Exam  Nursing note and vitals reviewed. Constitutional: He is oriented to person, place, and time.        respiratory distress.  BiPAP in place   HENT:  Head: Normocephalic and atraumatic.  Eyes: Conjunctivae normal and EOM are normal. Pupils are equal, round, and reactive to light.  Neck: Normal range of motion. Neck supple.  Cardiovascular: Normal rate and normal heart sounds.        Irregularly irregular  Pulmonary/Chest:       Not moving air well.  Abdominal: Soft. Bowel sounds are normal.  Musculoskeletal: Normal range of motion.  Neurological: He is alert and oriented to person, place, and time.  Skin: Skin is warm and dry.  Psychiatric: He has a normal mood and affect.    ED Course  Procedures (including critical care time)   Labs Reviewed  CBC WITH DIFFERENTIAL  BASIC  METABOLIC PANEL  TROPONIN I  PRO B NATRIURETIC PEPTIDE   No results found.   No diagnosis found.  Date: 07/12/2012  Rate: 124  Rhythm: atrial fibrillation  QRS Axis: normal  Intervals: normal  ST/T  Wave abnormalities: normal  Conduction Disutrbances:nonspecific intraventricular conduction delay  Narrative Interpretation:   Old EKG Reviewed: changes noted  CRITICAL CARE Performed by: Donnetta Hutching  ?  Total critical care time: 30  Critical care time was exclusive of separately billable procedures and treating other patients.  Critical care was necessary to treat or prevent imminent or life-threatening deterioration.  Critical care was time spent personally by me on the following activities: development of treatment plan with patient and/or surrogate as well as nursing, discussions with consultants, evaluation of patient's response to treatment, examination of patient, obtaining history from patient or surrogate, ordering and performing treatments and interventions, ordering and review of laboratory studies, ordering and review of radiographic studies, pulse oximetry and re-evaluation of patient's condition.  MDM  IV steroids, albuterol/Atrovent nebulizer, BiPAP all have helped to turn the patient around.  He has both a history of COPD and congestive heart failure. Chest x-ray suggests mild failure. IV Lasix given. Will admit to general medicine.       Donnetta Hutching, MD 07/12/12 2149

## 2012-07-12 NOTE — ED Notes (Signed)
Pt has hx of COPD, increasing SOB today, EMS gave duo neb, 125mg  solumedrol, placed pt on CPAP, and gave 1 SL nitro. Pt denies CP, states breathing is better on CPAP. Has a 16g IV in lt AC by EMS.

## 2012-07-12 NOTE — ED Notes (Signed)
Pt's O2 sats increase to 93-95% with deep inspiration then drop back to 87% on bipap, ear probe in use

## 2012-07-12 NOTE — ED Notes (Signed)
Per pt's daughter the pt very rarely ever increases above 93% as stated by Dr. Lodema Hong, pt is on chronic 3L O2 [place] at home

## 2012-07-13 ENCOUNTER — Telehealth: Payer: Self-pay | Admitting: Family Medicine

## 2012-07-13 ENCOUNTER — Encounter (HOSPITAL_COMMUNITY): Payer: Self-pay | Admitting: Internal Medicine

## 2012-07-13 ENCOUNTER — Inpatient Hospital Stay (HOSPITAL_COMMUNITY): Payer: Medicare Other

## 2012-07-13 DIAGNOSIS — E119 Type 2 diabetes mellitus without complications: Secondary | ICD-10-CM

## 2012-07-13 DIAGNOSIS — J449 Chronic obstructive pulmonary disease, unspecified: Secondary | ICD-10-CM

## 2012-07-13 DIAGNOSIS — E669 Obesity, unspecified: Secondary | ICD-10-CM

## 2012-07-13 DIAGNOSIS — J9610 Chronic respiratory failure, unspecified whether with hypoxia or hypercapnia: Secondary | ICD-10-CM

## 2012-07-13 HISTORY — DX: Type 2 diabetes mellitus without complications: E11.9

## 2012-07-13 LAB — GLUCOSE, CAPILLARY

## 2012-07-13 LAB — BASIC METABOLIC PANEL
CO2: 28 mEq/L (ref 19–32)
Chloride: 100 mEq/L (ref 96–112)
Creatinine, Ser: 0.97 mg/dL (ref 0.50–1.35)

## 2012-07-13 LAB — STREP PNEUMONIAE URINARY ANTIGEN: Strep Pneumo Urinary Antigen: NEGATIVE

## 2012-07-13 LAB — INFLUENZA PANEL BY PCR (TYPE A & B)
H1N1 flu by pcr: NOT DETECTED
Influenza B By PCR: NEGATIVE

## 2012-07-13 LAB — CBC
HCT: 40.3 % (ref 39.0–52.0)
MCV: 97.1 fL (ref 78.0–100.0)
RBC: 4.15 MIL/uL — ABNORMAL LOW (ref 4.22–5.81)
RDW: 15.6 % — ABNORMAL HIGH (ref 11.5–15.5)
WBC: 14.8 10*3/uL — ABNORMAL HIGH (ref 4.0–10.5)

## 2012-07-13 LAB — TROPONIN I: Troponin I: <0.3 ng/mL (ref <0.30)

## 2012-07-13 MED ORDER — IPRATROPIUM BROMIDE 0.02 % IN SOLN
0.5000 mg | Freq: Four times a day (QID) | RESPIRATORY_TRACT | Status: DC
  Administered 2012-07-13 – 2012-07-14 (×6): 0.5 mg via RESPIRATORY_TRACT
  Filled 2012-07-13 (×5): qty 2.5

## 2012-07-13 MED ORDER — ACETAMINOPHEN 325 MG PO TABS: 650.0000 mg | ORAL_TABLET | Freq: Four times a day (QID) | ORAL | Status: DC | PRN

## 2012-07-13 MED ORDER — LEVOFLOXACIN IN D5W 750 MG/150ML IV SOLN
INTRAVENOUS | Status: AC
  Filled 2012-07-13: qty 150

## 2012-07-13 MED ORDER — ALUM & MAG HYDROXIDE-SIMETH 200-200-20 MG/5ML PO SUSP: 30.0000 mL | Freq: Four times a day (QID) | ORAL | Status: DC | PRN

## 2012-07-13 MED ORDER — ONDANSETRON HCL 4 MG/2ML IJ SOLN: 4.0000 mg | Freq: Four times a day (QID) | INTRAMUSCULAR | Status: DC | PRN

## 2012-07-13 MED ORDER — SODIUM CHLORIDE 0.9 % IV SOLN
INTRAVENOUS | Status: DC
  Administered 2012-07-14: 10 mL/h via INTRAVENOUS

## 2012-07-13 MED ORDER — ONDANSETRON HCL 4 MG PO TABS: 4.0000 mg | ORAL_TABLET | Freq: Four times a day (QID) | ORAL | Status: DC | PRN

## 2012-07-13 MED ORDER — OXYMETAZOLINE HCL 0.05 % NA SOLN
2.0000 | Freq: Two times a day (BID) | NASAL | Status: DC | PRN
  Filled 2012-07-13: qty 15

## 2012-07-13 MED ORDER — DOCUSATE SODIUM 100 MG PO CAPS
100.0000 mg | ORAL_CAPSULE | Freq: Two times a day (BID) | ORAL | Status: DC
  Administered 2012-07-13 – 2012-07-15 (×6): 100 mg via ORAL
  Filled 2012-07-13 (×6): qty 1

## 2012-07-13 MED ORDER — DILTIAZEM HCL 30 MG PO TABS
30.0000 mg | ORAL_TABLET | Freq: Four times a day (QID) | ORAL | Status: DC
  Administered 2012-07-13 – 2012-07-15 (×9): 30 mg via ORAL
  Filled 2012-07-13 (×9): qty 1

## 2012-07-13 MED ORDER — PANTOPRAZOLE SODIUM 40 MG IV SOLR
40.0000 mg | Freq: Every day | INTRAVENOUS | Status: DC
  Administered 2012-07-13: 40 mg via INTRAVENOUS
  Filled 2012-07-13: qty 40

## 2012-07-13 MED ORDER — METHYLPREDNISOLONE SODIUM SUCC 125 MG IJ SOLR
125.0000 mg | Freq: Two times a day (BID) | INTRAMUSCULAR | Status: DC
  Administered 2012-07-13 – 2012-07-14 (×2): 125 mg via INTRAVENOUS
  Filled 2012-07-13: qty 2

## 2012-07-13 MED ORDER — ACETAMINOPHEN 650 MG RE SUPP: 650.0000 mg | Freq: Four times a day (QID) | RECTAL | Status: DC | PRN

## 2012-07-13 MED ORDER — ALBUTEROL SULFATE (5 MG/ML) 0.5% IN NEBU: 2.5000 mg | INHALATION_SOLUTION | RESPIRATORY_TRACT | Status: DC | PRN

## 2012-07-13 MED ORDER — TAMSULOSIN HCL 0.4 MG PO CAPS
0.4000 mg | ORAL_CAPSULE | Freq: Every day | ORAL | Status: DC
  Administered 2012-07-13 – 2012-07-15 (×3): 0.4 mg via ORAL
  Filled 2012-07-13 (×3): qty 1

## 2012-07-13 MED ORDER — MORPHINE SULFATE 2 MG/ML IJ SOLN: 1.0000 mg | INTRAMUSCULAR | Status: DC | PRN

## 2012-07-13 MED ORDER — SODIUM CHLORIDE 0.9 % IJ SOLN
3.0000 mL | Freq: Two times a day (BID) | INTRAMUSCULAR | Status: DC
  Administered 2012-07-13 – 2012-07-15 (×6): 3 mL via INTRAVENOUS

## 2012-07-13 MED ORDER — INSULIN ASPART 100 UNIT/ML ~~LOC~~ SOLN
0.0000 [IU] | Freq: Three times a day (TID) | SUBCUTANEOUS | Status: DC
  Administered 2012-07-13 – 2012-07-14 (×2): 5 [IU] via SUBCUTANEOUS
  Administered 2012-07-14: 2 [IU] via SUBCUTANEOUS
  Administered 2012-07-15: 5 [IU] via SUBCUTANEOUS
  Administered 2012-07-15: 3 [IU] via SUBCUTANEOUS

## 2012-07-13 MED ORDER — ALBUTEROL SULFATE (5 MG/ML) 0.5% IN NEBU
2.5000 mg | INHALATION_SOLUTION | Freq: Four times a day (QID) | RESPIRATORY_TRACT | Status: DC
  Administered 2012-07-13 – 2012-07-14 (×6): 2.5 mg via RESPIRATORY_TRACT
  Filled 2012-07-13 (×5): qty 0.5

## 2012-07-13 MED ORDER — INSULIN ASPART 100 UNIT/ML ~~LOC~~ SOLN: 0.0000 [IU] | Freq: Every day | SUBCUTANEOUS | Status: DC

## 2012-07-13 MED ORDER — ENOXAPARIN SODIUM 40 MG/0.4ML ~~LOC~~ SOLN: 40.0000 mg | SUBCUTANEOUS | Status: DC

## 2012-07-13 NOTE — Progress Notes (Signed)
Inpatient Diabetes Program Recommendations  AACE/ADA: New Consensus Statement on Inpatient Glycemic Control (2013)  Target Ranges:  Prepandial:   less than 140 mg/dL      Peak postprandial:   less than 180 mg/dL (1-2 hours)      Critically ill patients:  140 - 180 mg/dL  Results for KAILAND, SEDA (MRN [id number]) as of 07/13/2012 08:04  Ref. Range 07/12/2012 20:33 07/13/2012 02:40  Glucose Latest Range: 70-99 mg/dL 956 (H) 213 (H)   Results for LAYNE, DILAURO (MRN [id number]) as of 07/13/2012 08:04  Ref. Range 07/13/2012 07:19  Glucose-Capillary Latest Range: 70-99 mg/dL 629 (H)    Inpatient Diabetes Program Recommendations Correction (SSI): Please consider checking blood glucose ACHS with Novolog correction scale if needed while the patient is on steroids. HgbA1C: Please consider ordering an A1C.  The last A1C was 5.1% on 06/06/2011. Diet: May want to consider changing diet to Heart healthy Carb Modified ADA Diabetic diet.  Note: Patient has a history of diabetes, however, he does not take any medications at home for diabetes management.  Currently as an inpatient, there are not any medications ordered for glycemic control.  Patient's initial lab glucose was 154 mg/dl on 5/28 @ 41:32 and the fasting blood glucose was 158 mg/dl this morning.  Patient has only received one dose of Solumedrol 60mg  since arriving at the hospital.  Patient now has Solumedrol 125mg  IV Q12H ordered, therefore, it is anticipated that blood glucose will continue to rise.  Please consider checking blood glucose ACHS with Novolog correction is needed, ordering an A1C, and adding Carb Modified ADA Diabetic diet to comment on current heart healthy diet.  Will continue to follow.  Thanks, Orlando Penner, RN, BSN, CCRN Diabetes Coordinator Inpatient Diabetes Program [phone]

## 2012-07-13 NOTE — Care Management Note (Signed)
    Page 1 of 2   07/15/2012     3:44:12 PM   CARE MANAGEMENT NOTE 07/15/2012  Patient:  [name], [name]   Account Number:  [id number]  Date Initiated:  07/13/2012  Documentation initiated by:  Rosemary Holms  Subjective/Objective Assessment:   Pt admitted from home where she lives with her daughter. He is unsure about HH and requested RCM to discuss this with daughter.     Action/Plan:   Anticipated DC Date:  07/16/2012   Anticipated DC Plan:  HOME W HOME HEALTH SERVICES      DC Planning Services  CM consult      Choice offered to / List presented to:          [facility] arranged  HH-1 RN  HH-10 DISEASE MANAGEMENT  HH-2 PT      HH agency  [facility]   Status of service:  Completed, signed off Medicare Important Message given?   (If response is "NO", the following Medicare IM given date fields will be blank) Date Medicare IM given:   Date Additional Medicare IM given:    Discharge Disposition:  HOME W HOME HEALTH SERVICES  Per UR Regulation:    If discussed at Long Length of Stay Meetings, dates discussed:    Comments:  07/15/12 Rosemary Holms RN BSN CM Pt dc with HH. [facility] notified of referral. [facility] referral done. [facility] will make a transitional call to pt/daughter.  07/14/12 Rosemary Holms RN BSN CM Pt's daughter would like for pt to have [facility] RN and PT. Will follow for O2 needs.  07/13/12 Rosemary Holms RN BSN CM

## 2012-07-13 NOTE — Telephone Encounter (Signed)
Previous message sent to Dr regarding this

## 2012-07-13 NOTE — Plan of Care (Signed)
Problem: Consults Goal: Respiratory Problems Patient Education See Patient Education Module for education specifics. Outcome: Progressing Oxygen via Venturi Mask at 50%. No distress,  Face mottled and ashen, but overall skin color pale to normal  Problem: ICU Phase Progression Outcomes Goal: Initial discharge plan identified Outcome: Progressing Home with daughter [name]

## 2012-07-13 NOTE — Telephone Encounter (Signed)
Patient is upset that he was told he was diabetic and she doesn't want him treated for this because she has never been told this before

## 2012-07-13 NOTE — Telephone Encounter (Signed)
Pt daughter are called he's had increasing shortness of breath on his home oxygen his sats were 79%. She try switching him to take his sats were still low. Advised her to take him to the emergency room to be evaluated for the hypoxia he has COPD

## 2012-07-13 NOTE — Progress Notes (Signed)
UR Chart Review Completed  

## 2012-07-13 NOTE — Progress Notes (Signed)
Inpatient Diabetes Program Recommendations  AACE/ADA: New Consensus Statement on Inpatient Glycemic Control (2013)  Target Ranges:  Prepandial:   less than 140 mg/dL      Peak postprandial:   less than 180 mg/dL (1-2 hours)      Critically ill patients:  140 - 180 mg/dL   Note: Talked with the patient about his elevated blood glucose due to steroids since the patient and family had expressed some questions regarding a prior diagnosis of diabetes.  Informed the patient that while he was receiving steroids, it was likely that his blood sugars would be more elevated and that we would like to check his blood sugars ACHS and perhaps give him insulin if needed.  After explaining this to the patient he stated that he was fine with it and asked that I call his daughter and talk with her.  Therefore, I called [name] (patient's daughter) and explained how the steroids would increase the blood sugar and that we will like to check his blood sugar and give him insulin if needed.  [name] stated that she understood and was comfortable with [place] doing so.  Will continue to follow.  Thanks, Orlando Penner, RN, BSN, CCRN Diabetes Coordinator Inpatient Diabetes Program [phone]

## 2012-07-13 NOTE — Progress Notes (Addendum)
Subjective: This man feels somewhat better this morning. He was admitted yesterday with 3 to four-day history of nonproductive cough and dyspnea. He denies any subjective fevers. He denies any leg swelling.           Physical Exam: Blood pressure 115/64, pulse 78, temperature 97.6 F (36.4 C), temperature source Oral, resp. rate 22, height 6\' 1"  (1.854 m), weight 109.6 kg (241 lb 10 oz), SpO2 93.00%. He looks chronically sick. He is obese. Lung fields show mild bilateral wheezing with rhonchi. There are no inspiratory crackles. Heart sounds are present and in atrial fibrillation. Jugular venous pressure is not elevated. There is no peripheral pitting leg edema. Abdomen is soft and nontender. He is alert and orientated.   Investigations:  Recent Results (from the past 240 hour(s))  CULTURE, BLOOD (ROUTINE X 2)     Status: Normal (Preliminary result)   Collection Time   07/12/12 11:16 PM      Component Value Range Status Comment   Specimen Description Blood BLOOD RIGHT HAND   Final    Special Requests BOTTLES DRAWN AEROBIC AND ANAEROBIC 8CC   Final    Culture PENDING   Incomplete    Report Status PENDING   Incomplete   CULTURE, BLOOD (ROUTINE X 2)     Status: Normal (Preliminary result)   Collection Time   07/12/12 11:20 PM      Component Value Range Status Comment   Specimen Description Blood BLOOD RIGHT HAND   Final    Special Requests BOTTLES DRAWN AEROBIC AND ANAEROBIC [facility]   Final    Culture PENDING   Incomplete    Report Status PENDING   Incomplete   MRSA PCR SCREENING     Status: Normal   Collection Time   07/13/12  4:00 AM      Component Value Range Status Comment   MRSA by PCR NEGATIVE  NEGATIVE Final      Basic Metabolic Panel:  Basename 07/13/12 0240 07/12/12 2033  NA 137 140  K 4.6 4.3  CL 100 102  CO2 28 27  GLUCOSE 240* 154*  BUN 21 18  CREATININE 0.97 0.89  CALCIUM 9.2 9.6  MG -- 2.2  PHOS -- --       CBC:  Basename 07/13/12 0240  07/12/12 2033  WBC 14.8* 15.5*  NEUTROABS -- 14.3*  HGB 13.0 13.7  HCT 40.3 43.0  MCV 97.1 97.9  PLT 142* 159    Dg Chest Portable 1 View  07/12/2012  *RADIOLOGY REPORT*  Clinical Data: Shortness of breath.  PORTABLE CHEST - 1 VIEW  Comparison: 03/13/2012  Findings: Interstitial densities at the lung bases, right side greater than left.  Again noted is cardiomegaly and status post cardiac surgery.  There is mild peribronchial thickening.  IMPRESSION: Basilar atelectasis versus basilar interstitial edema.  Stable cardiomegaly.   Original Report Authenticated By: Richarda Overlie, M.D.       Medications: I have reviewed the patient's current medications.  Impression: 1. COPD exacerbation. 2. Atrial fibrillation with rapid ventricular response, now ventricular rate is controlled with Cardizem drip. He has been seen by Dr. Dietrich Pates, cardiology, who felt that he would not be a candidate for anticoagulation. He does have a history of GI bleeding episodes in the past. 3. History of chronic diastolic congestive heart failure, clinically he does not appear to be in heart failure by my exam and chest x-ray findings. 4. Status post CABG and mitral valve repair. 5. Peripheral vascular  disease. 6. Abdominal aortic aneurysm, not a candidate for surgery.     Plan: 1. Continue with intravenous steroids and antibiotics. 2. Check influenza PCR. 3. Discontinue Cardizem drip and start oral Cardizem. Not a candidate for anticoagulation from his atrial fibrillation. 4. Try to reduce supplemental oxygen. His baseline at home is 2-3 L per minute.     LOS: 1 day   [name] Pager [phone]  07/13/2012, 7:58 AM

## 2012-07-14 DIAGNOSIS — I509 Heart failure, unspecified: Secondary | ICD-10-CM

## 2012-07-14 DIAGNOSIS — R7309 Other abnormal glucose: Secondary | ICD-10-CM

## 2012-07-14 DIAGNOSIS — I5032 Chronic diastolic (congestive) heart failure: Secondary | ICD-10-CM

## 2012-07-14 DIAGNOSIS — J441 Chronic obstructive pulmonary disease with (acute) exacerbation: Secondary | ICD-10-CM

## 2012-07-14 DIAGNOSIS — T380X5A Adverse effect of glucocorticoids and synthetic analogues, initial encounter: Secondary | ICD-10-CM | POA: Diagnosis not present

## 2012-07-14 LAB — GLUCOSE, CAPILLARY
Glucose-Capillary: 144 mg/dL — ABNORMAL HIGH (ref 70–99)
Glucose-Capillary: 203 mg/dL — ABNORMAL HIGH (ref 70–99)

## 2012-07-14 LAB — COMPREHENSIVE METABOLIC PANEL
AST: 24 U/L (ref 0–37)
Albumin: 3.8 g/dL (ref 3.5–5.2)
Calcium: 9.2 mg/dL (ref 8.4–10.5)
Creatinine, Ser: 1.2 mg/dL (ref 0.50–1.35)
GFR calc non Af Amer: 52 mL/min — ABNORMAL LOW (ref >90)

## 2012-07-14 LAB — CBC
MCH: 30.8 pg (ref 26.0–34.0)
MCHC: 32.1 g/dL (ref 30.0–36.0)
MCV: 95.8 fL (ref 78.0–100.0)
Platelets: 157 10*3/uL (ref 150–400)
RDW: 15.8 % — ABNORMAL HIGH (ref 11.5–15.5)

## 2012-07-14 LAB — LEGIONELLA ANTIGEN, URINE

## 2012-07-14 LAB — URINE CULTURE

## 2012-07-14 MED ORDER — LEVALBUTEROL HCL 0.63 MG/3ML IN NEBU
0.6300 mg | INHALATION_SOLUTION | Freq: Four times a day (QID) | RESPIRATORY_TRACT | Status: DC
  Administered 2012-07-14 – 2012-07-15 (×5): 0.63 mg via RESPIRATORY_TRACT
  Filled 2012-07-14 (×6): qty 3

## 2012-07-14 MED ORDER — LEVOFLOXACIN IN D5W 750 MG/150ML IV SOLN
INTRAVENOUS | Status: AC
  Filled 2012-07-14: qty 150

## 2012-07-14 MED ORDER — SIMVASTATIN 20 MG PO TABS: 20.0000 mg | ORAL_TABLET | Freq: Every day | ORAL | Status: DC

## 2012-07-14 MED ORDER — FUROSEMIDE 40 MG PO TABS
60.0000 mg | ORAL_TABLET | Freq: Every day | ORAL | Status: DC
  Administered 2012-07-14 – 2012-07-15 (×2): 60 mg via ORAL
  Filled 2012-07-14 (×2): qty 1

## 2012-07-14 MED ORDER — LEVOFLOXACIN 750 MG PO TABS
750.0000 mg | ORAL_TABLET | Freq: Every day | ORAL | Status: DC
  Administered 2012-07-14: 750 mg via ORAL
  Filled 2012-07-14: qty 1

## 2012-07-14 MED ORDER — MOMETASONE FURO-FORMOTEROL FUM 100-5 MCG/ACT IN AERO
2.0000 | INHALATION_SPRAY | Freq: Two times a day (BID) | RESPIRATORY_TRACT | Status: DC
  Administered 2012-07-14 – 2012-07-15 (×3): 2 via RESPIRATORY_TRACT
  Filled 2012-07-14: qty 8.8

## 2012-07-14 MED ORDER — IPRATROPIUM BROMIDE 0.02 % IN SOLN: 0.5000 mg | Freq: Four times a day (QID) | RESPIRATORY_TRACT | Status: DC

## 2012-07-14 MED ORDER — PANTOPRAZOLE SODIUM 40 MG PO TBEC
40.0000 mg | DELAYED_RELEASE_TABLET | Freq: Every day | ORAL | Status: DC
  Administered 2012-07-14 – 2012-07-15 (×2): 40 mg via ORAL
  Filled 2012-07-14 (×3): qty 1

## 2012-07-14 MED ORDER — POTASSIUM CHLORIDE CRYS ER 20 MEQ PO TBCR
20.0000 meq | EXTENDED_RELEASE_TABLET | Freq: Two times a day (BID) | ORAL | Status: DC
  Administered 2012-07-14 – 2012-07-15 (×3): 20 meq via ORAL
  Filled 2012-07-14 (×3): qty 1

## 2012-07-14 MED ORDER — ATORVASTATIN CALCIUM 10 MG PO TABS: 10.0000 mg | ORAL_TABLET | Freq: Every day | ORAL | Status: DC

## 2012-07-14 MED ORDER — IPRATROPIUM BROMIDE 0.02 % IN SOLN
0.5000 mg | Freq: Four times a day (QID) | RESPIRATORY_TRACT | Status: DC
  Administered 2012-07-14 – 2012-07-15 (×5): 0.5 mg via RESPIRATORY_TRACT
  Filled 2012-07-14 (×6): qty 2.5

## 2012-07-14 MED ORDER — GUAIFENESIN ER 600 MG PO TB12
600.0000 mg | ORAL_TABLET | Freq: Two times a day (BID) | ORAL | Status: DC | PRN
  Administered 2012-07-14 – 2012-07-15 (×2): 600 mg via ORAL
  Filled 2012-07-14 (×2): qty 1

## 2012-07-14 MED ORDER — PREDNISONE 20 MG PO TABS
40.0000 mg | ORAL_TABLET | Freq: Two times a day (BID) | ORAL | Status: DC
  Administered 2012-07-14 – 2012-07-15 (×3): 40 mg via ORAL
  Filled 2012-07-14 (×3): qty 2

## 2012-07-14 NOTE — Progress Notes (Signed)
Chart reviewed.  Subjective: Breathing easier.  Some nonproductive cough.  Not yet back to baseline.  Objective: Vital signs in last 24 hours: Filed Vitals:   07/14/12 0400 07/14/12 0500 07/14/12 0543 07/14/12 0719  BP: 110/57 106/55 106/55   Pulse:      Temp: 97.8 F (36.6 C)     TempSrc: Oral     Resp: 19 21    Height:      Weight:  111.6 kg (246 lb 0.5 oz)    SpO2:    92%   Weight change: -1.361 kg (-3 lb)  Intake/Output Summary (Last 24 hours) at 07/14/12 0948 Last data filed at 07/14/12 0500  Gross per 24 hour  Intake   1030 ml  Output    700 ml  Net    330 ml   Gen.: Short of breath with exertion. Sitting in chair Lungs: Diminished throughout with slight wheeze and rhonchi Cardiovascular irregularly irregular Abdomen soft nontender nondistended Extremities trace edema. Sequential compression devices in place  Lab Results: Basic Metabolic Panel:  Lab 07/14/12 1610 07/13/12 0240 07/12/12 2033  NA 136 137 --  K 4.1 4.6 --  CL 99 100 --  CO2 26 28 --  GLUCOSE 163* 240* --  BUN 40* 21 --  CREATININE 1.20 0.97 --  CALCIUM 9.2 9.2 --  MG -- -- 2.2  PHOS -- -- --   Liver Function Tests:  Lab 07/14/12 0428  AST 24  ALT 14  ALKPHOS 66  BILITOT 0.3  PROT 7.2  ALBUMIN 3.8   No results found for this basename: LIPASE:2,AMYLASE:2 in the last 168 hours No results found for this basename: AMMONIA:2 in the last 168 hours CBC:  Lab 07/14/12 0428 07/13/12 0240 07/12/12 2033  WBC 13.6* 14.8* --  NEUTROABS -- -- 14.3*  HGB 12.3* 13.0 --  HCT 38.3* 40.3 --  MCV 95.8 97.1 --  PLT 157 142* --   Cardiac Enzymes:  Lab 07/13/12 0859 07/13/12 0240 07/12/12 2033  CKTOTAL -- -- --  CKMB -- -- --  CKMBINDEX -- -- --  TROPONINI <0.30 <0.30 <0.30   BNP:  Lab 07/12/12 2033  PROBNP 1668.0*   D-Dimer: No results found for this basename: DDIMER:2 in the last 168 hours CBG:  Lab 07/14/12 0726 07/13/12 2145 07/13/12 1717 07/13/12 0719  GLUCAP 144* 182* 201*  158*   Hemoglobin A1C:  Lab 07/13/12 0859  HGBA1C 5.8*   Fasting Lipid Panel: No results found for this basename: CHOL,HDL,LDLCALC,TRIG,CHOLHDL,LDLDIRECT in the last 960 hours Thyroid Function Tests:  Lab 07/12/12 2033  TSH 2.967  T4TOTAL --  FREET4 --  T3FREE --  THYROIDAB --   Coagulation: No results found for this basename: LABPROT:4,INR:4 in the last 168 hours Anemia Panel:  Lab 07/08/12 1022  VITAMINB12 --  FOLATE --  FERRITIN 52  TIBC --  IRON --  RETICCTPCT 1.4   Urine Drug Screen: Drugs of Abuse  No results found for this basename: labopia, cocainscrnur, labbenz, amphetmu, thcu, labbarb    Alcohol Level: No results found for this basename: ETH:2 in the last 168 hours Urinalysis:  Lab 07/12/12 2040  COLORURINE YELLOW  LABSPEC >1.030*  PHURINE 6.0  GLUCOSEU NEGATIVE  HGBUR NEGATIVE  BILIRUBINUR NEGATIVE  KETONESUR NEGATIVE  PROTEINUR 100*  UROBILINOGEN 0.2  NITRITE NEGATIVE  LEUKOCYTESUR NEGATIVE   Micro Results: Recent Results (from the past 240 hour(s))  URINE CULTURE     Status: Normal   Collection Time   07/12/12  8:40 PM  Component Value Range Status Comment   Specimen Description URINE, CLEAN CATCH   Final    Special Requests NONE   Final    Culture  Setup Time 07/12/2012 21:35   Final    Colony Count NO GROWTH   Final    Culture NO GROWTH   Final    Report Status 07/14/2012 FINAL   Final   CULTURE, BLOOD (ROUTINE X 2)     Status: Normal (Preliminary result)   Collection Time   07/12/12 11:16 PM      Component Value Range Status Comment   Specimen Description BLOOD RIGHT HAND   Final    Special Requests BOTTLES DRAWN AEROBIC AND ANAEROBIC 8CC   Final    Culture NO GROWTH 1 DAY   Final    Report Status PENDING   Incomplete   CULTURE, BLOOD (ROUTINE X 2)     Status: Normal (Preliminary result)   Collection Time   07/12/12 11:20 PM      Component Value Range Status Comment   Specimen Description BLOOD RIGHT HAND   Final    Special  Requests BOTTLES DRAWN AEROBIC AND ANAEROBIC 7CC   Final    Culture NO GROWTH 1 DAY   Final    Report Status PENDING   Incomplete   MRSA PCR SCREENING     Status: Normal   Collection Time   07/13/12  4:00 AM      Component Value Range Status Comment   MRSA by PCR NEGATIVE  NEGATIVE Final    Studies/Results: Portable Chest 1 View  07/13/2012  *RADIOLOGY REPORT*  Clinical Data: Shortness of breath and respiratory failure.  PORTABLE CHEST - 1 VIEW  Comparison: 07/12/2012  Findings: Increased opacities are again present bilaterally superimposed on chronic lung disease.  There may be a component of interstitial edema as well as potential pneumonia at both bases, right greater than left.  Significant cardiomegaly is unchanged.  IMPRESSION: Persistent abnormalities suggestive of interstitial edema as well as potential pneumonia.   Original Report Authenticated By: Irish Lack, M.D.    Dg Chest Portable 1 View  07/12/2012  *RADIOLOGY REPORT*  Clinical Data: Shortness of breath.  PORTABLE CHEST - 1 VIEW  Comparison: 03/13/2012  Findings: Interstitial densities at the lung bases, right side greater than left.  Again noted is cardiomegaly and status post cardiac surgery.  There is mild peribronchial thickening.  IMPRESSION: Basilar atelectasis versus basilar interstitial edema.  Stable cardiomegaly.   Original Report Authenticated By: Richarda Overlie, M.D.    Scheduled Meds:   . albuterol  2.5 mg Nebulization Q6H  . cefTRIAXone (ROCEPHIN)  IV  1 g Intravenous Q24H  . diltiazem  30 mg Oral Q6H  . docusate sodium  100 mg Oral BID  . insulin aspart  0-15 Units Subcutaneous TID WC  . insulin aspart  0-5 Units Subcutaneous QHS  . ipratropium  0.5 mg Nebulization Q6H  . levofloxacin (LEVAQUIN) IV  750 mg Intravenous Q24H  . methylPREDNISolone (SOLU-MEDROL) injection  125 mg Intravenous Q12H  . pantoprazole (PROTONIX) IV  40 mg Intravenous QHS  . sodium chloride  3 mL Intravenous Q12H  . Tamsulosin HCl  0.4  mg Oral Daily   Continuous Infusions:   . sodium chloride 10 mL/hr at 07/14/12 0500   PRN Meds:.acetaminophen, acetaminophen, albuterol, alum & mag hydroxide-simeth, morphine injection, ondansetron (ZOFRAN) IV, ondansetron, oxymetazoline Assessment/Plan: Active Problems:  COPD exacerbation  Chronic diastolic CHF (congestive heart failure)  Acute respiratory failure likely a combination  of pneumonia, COPD exacerbation, CHF.  Chronic respiratory failure  Atrial fibrillation, chronic  Peripheral vascular disease  Hearing loss  Allergic rhinitis  Arteriosclerotic cardiovascular disease (ASCVD)  GERD (gastroesophageal reflux disease)  HYPERLIPIDEMIA  Gait disturbance  Anemia, iron deficiency  Improving. Change steroids to prednisone 40 mg by mouth twice a day. Change albuterol to Xopenex due to atrial fibrillation with RVR. Continue current dose of Cardizem. Resume home medications. Change antibiotics by mouth. Transfer to telemetry.   LOS: 2 days   Sameen Leas L 07/14/2012, 9:48 AM

## 2012-07-14 NOTE — Telephone Encounter (Signed)
Spoke with [name] everything is sorted out he is not diabetic, her Dad is improving and should be home this Friday with home health nurses to visit

## 2012-07-14 NOTE — Progress Notes (Signed)
Report called to Loletta Specter, RN. Patient ready for transfer to room 341.  No acute distress noted.

## 2012-07-15 DIAGNOSIS — I4891 Unspecified atrial fibrillation: Secondary | ICD-10-CM

## 2012-07-15 LAB — GLUCOSE, CAPILLARY
Glucose-Capillary: 204 mg/dL — ABNORMAL HIGH (ref 70–99)
Glucose-Capillary: 156 mg/dL — ABNORMAL HIGH (ref 70–99)

## 2012-07-15 MED ORDER — PREDNISONE 10 MG PO TABS: ORAL_TABLET | ORAL | Status: DC

## 2012-07-15 MED ORDER — DILTIAZEM HCL 120 MG PO TABS: 120.0000 mg | ORAL_TABLET | Freq: Four times a day (QID) | ORAL | Status: DC

## 2012-07-15 MED ORDER — OXYMETAZOLINE HCL 0.05 % NA SOLN
2.0000 | Freq: Once | NASAL | Status: AC
  Administered 2012-07-15: 2 via NASAL
  Filled 2012-07-15: qty 15

## 2012-07-15 MED ORDER — TAMSULOSIN HCL 0.4 MG PO CAPS: 0.4000 mg | ORAL_CAPSULE | Freq: Every day | ORAL | Status: DC

## 2012-07-15 MED ORDER — LEVOFLOXACIN 750 MG PO TABS: 750.0000 mg | ORAL_TABLET | Freq: Every day | ORAL | Status: DC

## 2012-07-15 NOTE — Progress Notes (Signed)
Patient evaluated for community based chronic disease management services with [facility] Care Management Program as a benefit of patient's [facility] Medicare Insurance. Patient will receive a post discharge transition of care call and will be evaluated for monthly home visits for assessments and disease process education. Per staff recommendation writer spoke with patient's daughter by phone to explain [facility] Care Management services. Daughter is not opposed to a transition of care call.  She feels that she may benefit from services, but she would like to review the literature prior to making a decision about formal service engagement.  Left contact information and [facility] literature at bedside for her review. Made inpatient Case Manager aware that [facility] Care Management consult results.  Recommended Palliative Care consult due to patients advanced age, medical condition, and PAR code status.  Of note, [facility] Care Management services does not replace or interfere with any services that are arranged by inpatient case management or social work.  For additional questions or referrals please contact Anibal Henderson BSN RN [facility] Liaison at [phone].

## 2012-07-15 NOTE — Discharge Summary (Signed)
Physician Discharge Summary  Patient ID: [name] MRN: [id number] DOB/AGE: 12-06-1921 79 y.o.  Admit date: 07/12/2012 Discharge date: 07/15/2012  Discharge Diagnoses:  Active Problems:  COPD exacerbation  Chronic diastolic CHF (congestive heart failure)  Acute respiratory failure  Chronic respiratory failure  Atrial fibrillation, with RVR  Peripheral vascular disease  Hearing loss  Allergic rhinitis  Arteriosclerotic cardiovascular disease (ASCVD)  GERD (gastroesophageal reflux disease)  Steroid-induced hyperglycemia  HYPERLIPIDEMIA  Gait disturbance  Anemia, iron deficiency Probable pmeumonia Pulmonary hypertension Urinary retention Bioprosthetic mitral valve    Medication List     As of 07/15/2012  1:33 PM    TAKE these medications         albuterol (2.5 MG/3ML) 0.083% nebulizer solution   Commonly known as: PROVENTIL   Take 3 mLs (2.5 mg total) by nebulization every 6 (six) hours as needed. Mixes with the Ipratropium(for shortness of breath)      diltiazem 120 MG tablet   Commonly known as: CARDIZEM   Take 1 tablet (120 mg total) by mouth 4 (four) times daily.      Fluticasone-Salmeterol 100-50 MCG/DOSE Aepb   Commonly known as: ADVAIR   Inhale 1 puff into the lungs every 12 (twelve) hours.      furosemide 20 MG tablet   Commonly known as: LASIX   Take 60 mg by mouth daily.      ipratropium 0.02 % nebulizer solution   Commonly known as: ATROVENT   Take 500 mcg by nebulization every 6 (six) hours as needed. Shortness of breath      levofloxacin 750 MG tablet   Commonly known as: LEVAQUIN   Take 1 tablet (750 mg total) by mouth daily.      lovastatin 40 MG tablet   Commonly known as: MEVACOR   Take 40 mg by mouth at bedtime. Only takes on Sun, Mon, Wed, and Fri.      omeprazole 40 MG capsule   Commonly known as: PRILOSEC   Take 40 mg by mouth daily.      oxymetazoline 0.05 % nasal spray   Commonly known as: AFRIN   Place 1 spray into the  nose daily as needed. Congestion      potassium chloride SA 20 MEQ tablet   Commonly known as: K-DUR,KLOR-CON   Take 20 mEq by mouth 2 (two) times daily.      predniSONE 10 MG tablet   Commonly known as: DELTASONE   4 tablets daily for 2 days then decrease by one tablet daily every 2 days until off      Tamsulosin HCl 0.4 MG Caps   Commonly known as: FLOMAX   Take 1 capsule (0.4 mg total) by mouth daily.       continue 2 L of oxygen by nasal cannula      Discharge Orders    Future Appointments: Provider: Department: Dept Phone: Center:   07/22/2012 10:30 AM [facility] [phone] None   08/05/2012 10:20 AM [facility] [phone] None   08/19/2012 10:20 AM [facility] CENTER [phone] None   08/19/2012 10:30 AM Ellouise Newer, PA [facility] CANCER CENTER [phone] None   09/07/2012 1:00 PM Kerri Perches, MD [facility] [phone] [facility]   09/28/2012 10:00 AM [facility] [place] [facility]ENTER [phone] [facility]   09/28/2012 10:30 AM [facility] [place] [facility]ENTER [phone] [facility]   09/28/2012  11:45 AM Nada Libman, MD [facility] [phone] [facility]     Future Orders Please Complete By Expires   Diet - low sodium heart healthy      Increase activity slowly         Follow-up Information    Follow up with [name], MD.   Contact information:   [address] [place] [address] [phone]       Follow up with [name], MD. In 2 weeks.   Contact information:   [address] [place] [address] [phone]          Disposition: 01-Home or Self Care with home PT and RN  Discharged Condition: stable  Consults:  none  Labs:   Results for orders placed during the hospital encounter of 07/12/12 (from the past 48 hour(s))  GLUCOSE, CAPILLARY      Status: Abnormal   Collection Time   07/13/12  5:17 PM      Component Value Range Comment   Glucose-Capillary 201 (*) 70 - 99 mg/dL    Comment 1 Notify RN     GLUCOSE, CAPILLARY     Status: Abnormal   Collection Time   07/13/12  9:45 PM      Component Value Range Comment   Glucose-Capillary 182 (*) 70 - 99 mg/dL    Comment 1 Notify RN     CBC     Status: Abnormal   Collection Time   07/14/12  4:28 AM      Component Value Range Comment   WBC 13.6 (*) 4.0 - 10.5 K/uL    RBC 4.00 (*) 4.22 - 5.81 MIL/uL    Hemoglobin 12.3 (*) 13.0 - 17.0 g/dL    HCT 28.4 (*) 13.2 - 52.0 %    MCV 95.8  78.0 - 100.0 fL    MCH 30.8  26.0 - 34.0 pg    MCHC 32.1  30.0 - 36.0 g/dL    RDW 44.0 (*) 10.2 - 15.5 %    Platelets 157  150 - 400 K/uL   COMPREHENSIVE METABOLIC PANEL     Status: Abnormal   Collection Time   07/14/12  4:28 AM      Component Value Range Comment   Sodium 136  135 - 145 mEq/L    Potassium 4.1  3.5 - 5.1 mEq/L    Chloride 99  96 - 112 mEq/L    CO2 26  19 - 32 mEq/L    Glucose, Bld 163 (*) 70 - 99 mg/dL    BUN 40 (*) 6 - 23 mg/dL DELTA CHECK NOTED   Creatinine, Ser 1.20  0.50 - 1.35 mg/dL    Calcium 9.2  8.4 - 72.5 mg/dL    Total Protein 7.2  6.0 - 8.3 g/dL    Albumin 3.8  3.5 - 5.2 g/dL    AST 24  0 - 37 U/L    ALT 14  0 - 53 U/L    Alkaline Phosphatase 66  39 - 117 U/L    Total Bilirubin 0.3  0.3 - 1.2 mg/dL    GFR calc non Af Amer 52 (*) >90 mL/min    GFR calc Af Amer 60 (*) >90 mL/min   GLUCOSE, CAPILLARY     Status: Abnormal   Collection Time   07/14/12  7:26 AM      Component Value Range Comment   Glucose-Capillary 144 (*) 70 - 99 mg/dL  GLUCOSE, CAPILLARY     Status: Abnormal   Collection Time   07/14/12 11:18 AM      Component Value Range Comment   Glucose-Capillary 203 (*) 70 - 99 mg/dL    Comment 1 Documented in Chart      Comment 2 Notify RN     GLUCOSE, CAPILLARY     Status: Abnormal   Collection Time   07/14/12  9:48 PM      Component Value Range Comment    Glucose-Capillary 167 (*) 70 - 99 mg/dL    Comment 1 Notify RN      Comment 2 Documented in Chart     GLUCOSE, CAPILLARY     Status: Abnormal   Collection Time   07/15/12  7:41 AM      Component Value Range Comment   Glucose-Capillary 145 (*) 70 - 99 mg/dL    Comment 1 Documented in Chart      Comment 2 Notify RN     GLUCOSE, CAPILLARY     Status: Abnormal   Collection Time   07/15/12  8:50 AM      Component Value Range Comment   Glucose-Capillary 204 (*) 70 - 99 mg/dL   GLUCOSE, CAPILLARY     Status: Abnormal   Collection Time   07/15/12 11:06 AM      Component Value Range Comment   Glucose-Capillary 156 (*) 70 - 99 mg/dL    Comment 1 Documented in Chart      Comment 2 Notify RN       Diagnostics:  Portable Chest 1 View  07/13/2012  *RADIOLOGY REPORT*  Clinical Data: Shortness of breath and respiratory failure.  PORTABLE CHEST - 1 VIEW  Comparison: 07/12/2012  Findings: Increased opacities are again present bilaterally superimposed on chronic lung disease.  There may be a component of interstitial edema as well as potential pneumonia at both bases, right greater than left.  Significant cardiomegaly is unchanged.  IMPRESSION: Persistent abnormalities suggestive of interstitial edema as well as potential pneumonia.   Original Report Authenticated By: Irish Lack, M.D.    Dg Chest Portable 1 View  07/12/2012  *RADIOLOGY REPORT*  Clinical Data: Shortness of breath.  PORTABLE CHEST - 1 VIEW  Comparison: 03/13/2012  Findings: Interstitial densities at the lung bases, right side greater than left.  Again noted is cardiomegaly and status post cardiac surgery.  There is mild peribronchial thickening.  IMPRESSION: Basilar atelectasis versus basilar interstitial edema.  Stable cardiomegaly.   Original Report Authenticated By: Richarda Overlie, M.D.    EKG: Atrial fibrillation with rapid ventricular response. Rate 125. Left bundle branch block  Partial Code   Hospital Course: See H&P for complete  admission details. The patient is an 79 year old white male with a history of multiple medical problems including COPD, chronic diastolic heart failure, chronic hypoxia who presented with shortness of breath. His daughter checked his oxygen level at home and saturation was found to be a 83 percent. He reported shortness of breath, cough, wheezing. Last echocardiogram in 2013 showed normal left ventricular function. Pulmonary hypertension.  In the emergency room, he was tachycardic. He was on BiPAP. Blood pressure was normal. He had bilateral wheezing, poor air movement. Pro BNP was 1668. Chest x-ray showed interstitial densities at the lung bases. Mild peribronchial thickening. Patient was felt to have multifactorial acute on chronic respiratory failure. Likely a combination of pneumonia, CHF exacerbation, COPD exacerbation. He also had atrial fibrillation with rapid ventricular response. He was started on steroids,  Cardizem drip, bronchodilators, antibiotics and Lasix. He was able to be taken off BiPAP and placed on nasal cannula oxygen. By the time of discharge, he was feeling back to baseline. His lungs are clear to auscultation bilaterally without wheeze rhonchi or rales. He remains in atrial fibrillation, but is rate controlled. He has been followed by Dr. Dietrich Pates in the past who felt he is not a candidate for anticoagulation. He has a history of AV malformations and GI bleeds as well as chronic anemia. He has been started on low-dose Cardizem by mouth. He will taper his prednisone and continue a course of levofloxacin. Home health nursing and physical therapy has been arranged. Total time on the day of discharge greater than 30 minutes.  Discharge Exam:  Blood pressure 134/77, pulse 80, temperature 97.3 F (36.3 C), temperature source Oral, resp. rate 18, height 6\' 1"  (1.854 m), weight 111.6 kg (246 lb 0.5 oz), SpO2 91.00%.  Gen.: Comfortable. Breathing nonlabored. Lungs clear to auscultation  bilaterally without wheeze rhonchi or rales Cardiovascular irregularly irregular Abdomen soft nontender nondistended Extremities no clubbing cyanosis or edema next  Signed: Jadee Golebiewski L 07/15/2012, 1:33 PM

## 2012-07-16 NOTE — Progress Notes (Signed)
Pt and daughter verbalize understanding of d/c instructions and medications. No questions at this time. IV d/c. Pt d/c via wheelchair accompanied by his daughter and NT. Sheryn Bison

## 2012-07-17 LAB — CULTURE, BLOOD (ROUTINE X 2)
Culture: NO GROWTH
Culture: NO GROWTH

## 2012-07-19 ENCOUNTER — Telehealth: Payer: Self-pay | Admitting: Family Medicine

## 2012-07-19 NOTE — Telephone Encounter (Signed)
Pt [facility] nurse called, she was there for routine assessment noted his weight up 3lbs and he was collecting fluid in his "abdomen", no SOB, no CP, states he appeared stable. He takes 60mg  of lasix, is on Prednisone due to recent admission for COPD.  Advised to give extra 20mg  dose of lasix, they will reassess Monday

## 2012-07-21 ENCOUNTER — Emergency Department (HOSPITAL_COMMUNITY): Payer: Medicare Other

## 2012-07-21 ENCOUNTER — Encounter (HOSPITAL_COMMUNITY): Payer: Self-pay | Admitting: *Deleted

## 2012-07-21 ENCOUNTER — Inpatient Hospital Stay (HOSPITAL_COMMUNITY)
Admission: EM | Admit: 2012-07-21 | Discharge: 2012-07-28 | DRG: 394 | Disposition: A | Discharge status: Skilled Nursing Facility | Payer: Medicare Other | Attending: Surgery | Admitting: Surgery

## 2012-07-21 ENCOUNTER — Telehealth: Payer: Self-pay | Admitting: Family Medicine

## 2012-07-21 DIAGNOSIS — I714 Abdominal aortic aneurysm, without rupture, unspecified: Secondary | ICD-10-CM

## 2012-07-21 DIAGNOSIS — C61 Malignant neoplasm of prostate: Secondary | ICD-10-CM

## 2012-07-21 DIAGNOSIS — R58 Hemorrhage, not elsewhere classified: Secondary | ICD-10-CM

## 2012-07-21 DIAGNOSIS — J9610 Chronic respiratory failure, unspecified whether with hypoxia or hypercapnia: Secondary | ICD-10-CM

## 2012-07-21 DIAGNOSIS — I719 Aortic aneurysm of unspecified site, without rupture: Secondary | ICD-10-CM

## 2012-07-21 DIAGNOSIS — D509 Iron deficiency anemia, unspecified: Secondary | ICD-10-CM

## 2012-07-21 DIAGNOSIS — I2510 Atherosclerotic heart disease of native coronary artery without angina pectoris: Secondary | ICD-10-CM

## 2012-07-21 DIAGNOSIS — M549 Dorsalgia, unspecified: Secondary | ICD-10-CM

## 2012-07-21 DIAGNOSIS — K683 Retroperitoneal hematoma: Secondary | ICD-10-CM

## 2012-07-21 DIAGNOSIS — I252 Old myocardial infarction: Secondary | ICD-10-CM

## 2012-07-21 DIAGNOSIS — E119 Type 2 diabetes mellitus without complications: Secondary | ICD-10-CM | POA: Diagnosis present

## 2012-07-21 DIAGNOSIS — E871 Hypo-osmolality and hyponatremia: Secondary | ICD-10-CM | POA: Diagnosis present

## 2012-07-21 DIAGNOSIS — E875 Hyperkalemia: Secondary | ICD-10-CM | POA: Diagnosis not present

## 2012-07-21 DIAGNOSIS — I4891 Unspecified atrial fibrillation: Secondary | ICD-10-CM | POA: Diagnosis present

## 2012-07-21 DIAGNOSIS — E785 Hyperlipidemia, unspecified: Secondary | ICD-10-CM

## 2012-07-21 DIAGNOSIS — J4489 Other specified chronic obstructive pulmonary disease: Secondary | ICD-10-CM | POA: Diagnosis present

## 2012-07-21 DIAGNOSIS — I341 Nonrheumatic mitral (valve) prolapse: Secondary | ICD-10-CM

## 2012-07-21 DIAGNOSIS — T380X5A Adverse effect of glucocorticoids and synthetic analogues, initial encounter: Secondary | ICD-10-CM

## 2012-07-21 DIAGNOSIS — R739 Hyperglycemia, unspecified: Secondary | ICD-10-CM

## 2012-07-21 DIAGNOSIS — H9190 Unspecified hearing loss, unspecified ear: Secondary | ICD-10-CM | POA: Diagnosis present

## 2012-07-21 DIAGNOSIS — I482 Chronic atrial fibrillation, unspecified: Secondary | ICD-10-CM

## 2012-07-21 DIAGNOSIS — Z87891 Personal history of nicotine dependence: Secondary | ICD-10-CM

## 2012-07-21 DIAGNOSIS — I10 Essential (primary) hypertension: Secondary | ICD-10-CM | POA: Diagnosis present

## 2012-07-21 DIAGNOSIS — I5032 Chronic diastolic (congestive) heart failure: Secondary | ICD-10-CM

## 2012-07-21 DIAGNOSIS — K7460 Unspecified cirrhosis of liver: Secondary | ICD-10-CM | POA: Diagnosis present

## 2012-07-21 DIAGNOSIS — D62 Acute posthemorrhagic anemia: Secondary | ICD-10-CM | POA: Diagnosis not present

## 2012-07-21 DIAGNOSIS — K661 Hemoperitoneum: Principal | ICD-10-CM | POA: Diagnosis present

## 2012-07-21 DIAGNOSIS — K219 Gastro-esophageal reflux disease without esophagitis: Secondary | ICD-10-CM

## 2012-07-21 DIAGNOSIS — E669 Obesity, unspecified: Secondary | ICD-10-CM | POA: Diagnosis present

## 2012-07-21 DIAGNOSIS — D72829 Elevated white blood cell count, unspecified: Secondary | ICD-10-CM | POA: Diagnosis present

## 2012-07-21 DIAGNOSIS — D508 Other iron deficiency anemias: Secondary | ICD-10-CM

## 2012-07-21 DIAGNOSIS — J449 Chronic obstructive pulmonary disease, unspecified: Secondary | ICD-10-CM | POA: Diagnosis present

## 2012-07-21 DIAGNOSIS — J9600 Acute respiratory failure, unspecified whether with hypoxia or hypercapnia: Secondary | ICD-10-CM

## 2012-07-21 DIAGNOSIS — Z9861 Coronary angioplasty status: Secondary | ICD-10-CM | POA: Diagnosis present

## 2012-07-21 DIAGNOSIS — Z954 Presence of other heart-valve replacement: Secondary | ICD-10-CM

## 2012-07-21 DIAGNOSIS — I509 Heart failure, unspecified: Secondary | ICD-10-CM | POA: Diagnosis present

## 2012-07-21 DIAGNOSIS — M1990 Unspecified osteoarthritis, unspecified site: Secondary | ICD-10-CM | POA: Diagnosis present

## 2012-07-21 DIAGNOSIS — K449 Diaphragmatic hernia without obstruction or gangrene: Secondary | ICD-10-CM | POA: Diagnosis present

## 2012-07-21 DIAGNOSIS — Z79899 Other long term (current) drug therapy: Secondary | ICD-10-CM

## 2012-07-21 DIAGNOSIS — Z951 Presence of aortocoronary bypass graft: Secondary | ICD-10-CM

## 2012-07-21 LAB — TROPONIN I: Troponin I: <0.3 ng/mL (ref <0.30)

## 2012-07-21 LAB — CBC WITH DIFFERENTIAL/PLATELET
Basophils Absolute: 0 10*3/uL (ref 0.0–0.1)
Eosinophils Relative: 0 % (ref 0–5)
Lymphs Abs: 4.1 10*3/uL — ABNORMAL HIGH (ref 0.7–4.0)
MCV: 93.5 fL (ref 78.0–100.0)
Monocytes Relative: 5 % (ref 3–12)
Neutrophils Relative %: 80 % — ABNORMAL HIGH (ref 43–77)
Platelets: 238 10*3/uL (ref 150–400)
RBC: 3.71 MIL/uL — ABNORMAL LOW (ref 4.22–5.81)
RDW: 15.6 % — ABNORMAL HIGH (ref 11.5–15.5)
WBC: 27.2 10*3/uL — ABNORMAL HIGH (ref 4.0–10.5)

## 2012-07-21 LAB — BASIC METABOLIC PANEL
CO2: 31 mEq/L (ref 19–32)
Calcium: 8.5 mg/dL (ref 8.4–10.5)
Creatinine, Ser: 1.25 mg/dL (ref 0.50–1.35)
GFR calc non Af Amer: 50 mL/min — ABNORMAL LOW (ref >90)
Glucose, Bld: 213 mg/dL — ABNORMAL HIGH (ref 70–99)

## 2012-07-21 LAB — URINALYSIS, ROUTINE W REFLEX MICROSCOPIC
Bilirubin Urine: NEGATIVE
Glucose, UA: NEGATIVE mg/dL
Ketones, ur: NEGATIVE mg/dL
Protein, ur: NEGATIVE mg/dL
pH: 6 (ref 5.0–8.0)

## 2012-07-21 LAB — HEPATIC FUNCTION PANEL
ALT: 36 U/L (ref 0–53)
AST: 23 U/L (ref 0–37)
Bilirubin, Direct: 0.3 mg/dL (ref 0.0–0.3)
Total Bilirubin: 1 mg/dL (ref 0.3–1.2)

## 2012-07-21 LAB — LACTIC ACID, PLASMA: Lactic Acid, Venous: 1.4 mmol/L (ref 0.5–2.2)

## 2012-07-21 MED ORDER — IOHEXOL 350 MG/ML SOLN
100.0000 mL | Freq: Once | INTRAVENOUS | Status: AC | PRN
  Administered 2012-07-21: 100 mL via INTRAVENOUS

## 2012-07-21 MED ORDER — HYDROMORPHONE HCL PF 1 MG/ML IJ SOLN
0.5000 mg | Freq: Once | INTRAMUSCULAR | Status: AC
  Administered 2012-07-21: 0.5 mg via INTRAVENOUS
  Filled 2012-07-21: qty 1

## 2012-07-21 MED ORDER — SODIUM CHLORIDE 0.9 % IV BOLUS (SEPSIS)
250.0000 mL | Freq: Once | INTRAVENOUS | Status: AC
  Administered 2012-07-21: 250 mL via INTRAVENOUS

## 2012-07-21 NOTE — ED Notes (Signed)
Report given to Sam, RN with [facility], ETA of 45 min to an hour

## 2012-07-21 NOTE — ED Notes (Signed)
Patient transported to CT 

## 2012-07-21 NOTE — ED Notes (Addendum)
Pt from home with pain all over, recent admission for CHF, [facility] nurse exercises ? Over did it with PT, denies eating or drinking PO's today, pain to shoulder blade and abd pain, daughter  [name] [phone]

## 2012-07-21 NOTE — ED Notes (Signed)
Called [facility] for on call vascular surgeon.

## 2012-07-21 NOTE — ED Provider Notes (Addendum)
History     CSN: [id number]  Arrival date & time 07/21/12  4098   First MD Initiated Contact with Patient 07/21/12 1931      Chief Complaint  Patient presents with  . Abdominal Pain    (Consider location/radiation/quality/duration/timing/severity/associated sxs/prior treatment) Patient is a 79 y.o. male presenting with abdominal pain. The history is provided by the patient (the pt has back pain).  Abdominal Pain The primary symptoms of the illness include abdominal pain. The current episode started 13 to 24 hours ago. The onset of the illness was sudden. The problem has not changed since onset. Associated with: nothing. The patient has not had a change in bowel habit. Risk factors for an acute abdominal problem include being elderly.    Past Medical History  Diagnosis Date  . Mitral valve prolapse 2003    MVR in 2003  . Obesity   . Osteoarthritis   . Hypertension   . Hyperlipidemia   . COPD (chronic obstructive pulmonary disease)   . CHF (congestive heart failure) diastolic 10/2010    CXR in 12/2010: Prior CABG, cardiomegaly, vascular redistribution, bibasilar atelectasis, small effusions  . Pneumonia   . Impaired glucose tolerance   . Atrial fibrillation, chronic   . Prostate cancer     elevated PSA  . GERD (gastroesophageal reflux disease)     + hiatal hernia  . Arteriosclerotic cardiovascular disease (ASCVD) 1994    stent to RCA; CABG-2003  . Hard of hearing   . Anemia, iron deficiency   . AAA (abdominal aortic aneurysm)     Fusiform; infrarenal; 4-4.1cm on CT in 07/2008 and 12/2010 by MRI  . Gastric ulcer 2004    2004; upper GI bleed  . Renal cysts, acquired, bilateral     Complex by MRI in 12/2010  . Adenomatous polyps 04/07/2012  . HYPERTENSION 12/17/2007    Subsequently hypotensive and all antihypertensive medication discontinued Lab  03/2012: Mild anemia with H&H-11.8/37.5, MCV-97, ferritin-224, normal CMet ex G-124, alb-3.4   . NSTEMI (non-ST elevated  myocardial infarction) 01/18/2011  . Diabetes mellitus, type II 07/13/12    family denies patient is diabetic    Past Surgical History  Procedure Date  . Knee surgery     Left  . Bladder surgery   . Femoral artery stent 12-06-10    Left SFA  . Appendectomy   . Mitral valve replacement (mvr)/coronary artery bypass grafting (cabg) 2003    stent to RCA in 1994  . Eye surgery 2007    bilateral cataracts  . Tonsillectomy     thinks they were removed while in the [employer]  . Mass excision 06/07/2011    Procedure: EXCISION MASS;  Surgeon: Fabio Bering;  Location: [facility];  Service: General;  Laterality: N/A;  excision of 2 masses back and buttocks  . Back surgery   . Esophagogastroduodenoscopy 03/03/2003    Large, deep prepyloric ulcer, as described above without bleeding/ Normal esophagus  . Esophagogastroduodenoscopy 09/03/2004    normal throughout  . Colonoscopy 09/03/2004    small ulcer, without stigmata of bleeding  . Esophagogastroduodenoscopy 11/14/2004    Normal esophagus and small hiatal hernia, otherwise normal stomach   . Colonoscopy 11/14/2004    Internal hemorrhoids, otherwise normal rectum/ left-sided diverticula , diffusely oozing right colon mucosa without a discrete lesion amenable to endoscopic therapy. 2 diminutive polyps. FELT TO HAVE AVMs/telangiectasias  . Colonoscopy 01/29/2005    Normal rectum  . Colonoscopy 03/11/2012    Colonic diverticulosis. Colonic polyps-removed as  described above. Vascular anomalies in the cecum likely representing hemangiomas. Status post hemostasis clipping of  2 of the 3. ADENOMATOUS POLYPS. Repeat 2016  . Esophagogastroduodenoscopy 03/11/2012    Deformity of the antrum;  small polyp in antrum-not manipulated. Otherwise normal exam    Family History  Problem Relation Age of Onset  . Stroke Mother   . Stroke Brother   . Heart attack Brother   . Cancer Brother   . Cancer Brother   . Anesthesia problems Neg Hx   . Hypotension Neg Hx     . Malignant hyperthermia Neg Hx   . Pseudochol deficiency Neg Hx     History  Substance Use Topics  . Smoking status: Former Smoker -- 1.0 packs/day for 70 years    Types: Cigarettes    Quit date: 06/25/2007  . Smokeless tobacco: Former Neurosurgeon  . Alcohol Use: No      Review of Systems  Gastrointestinal: Positive for abdominal pain.    Allergies  Gabapentin  Home Medications   Current Outpatient Rx  Name  Route  Sig  Dispense  Refill  . ALBUTEROL SULFATE (2.5 MG/3ML) 0.083% IN NEBU   Nebulization   Take 3 mLs (2.5 mg total) by nebulization every 6 (six) hours as needed. Mixes with the Ipratropium(for shortness of breath)   300 mL   1   . DILTIAZEM HCL 120 MG PO TABS   Oral   Take 1 tablet (120 mg total) by mouth 4 (four) times daily.   30 tablet   0   . FLUTICASONE-SALMETEROL 100-50 MCG/DOSE IN AEPB   Inhalation   Inhale 1 puff into the lungs every 12 (twelve) hours.         . FUROSEMIDE 20 MG PO TABS   Oral   Take 60 mg by mouth daily.         . IPRATROPIUM BROMIDE 0.02 % IN SOLN   Nebulization   Take 500 mcg by nebulization every 6 (six) hours as needed. Shortness of breath         . LEVOFLOXACIN 750 MG PO TABS   Oral   Take 1 tablet (750 mg total) by mouth daily.   2 tablet   0   . LOVASTATIN 40 MG PO TABS   Oral   Take 40 mg by mouth at bedtime. Only takes on Sun, Mon, Wed, and Fri.         . OMEPRAZOLE 40 MG PO CPDR   Oral   Take 40 mg by mouth daily.         Marland Kitchen OXYMETAZOLINE HCL 0.05 % NA SOLN   Nasal   Place 1 spray into the nose daily as needed. Congestion         . POTASSIUM CHLORIDE CRYS ER 20 MEQ PO TBCR   Oral   Take 20 mEq by mouth 2 (two) times daily.         Marland Kitchen PREDNISONE 10 MG PO TABS      4 tablets daily for 2 days then decrease by one tablet daily every 2 days until off   20 tablet   0   . TAMSULOSIN HCL 0.4 MG PO CAPS   Oral   Take 1 capsule (0.4 mg total) by mouth daily.   30 capsule   0     BP 105/61   Pulse 74  Temp 97.9 F (36.6 C) (Oral)  Resp 17  SpO2 94%  Physical Exam  Constitutional: He is oriented  to person, place, and time. He appears well-developed.  HENT:  Head: Normocephalic and atraumatic.  Eyes: Conjunctivae normal and EOM are normal. No scleral icterus.  Neck: Neck supple. No thyromegaly present.  Cardiovascular: Normal rate and regular rhythm.  Exam reveals no gallop and no friction rub.   No murmur heard. Pulmonary/Chest: No stridor. He has no wheezes. He has no rales. He exhibits no tenderness.  Abdominal: He exhibits no distension. There is tenderness. There is no rebound.       Mild abd tenderness  Musculoskeletal: Normal range of motion. He exhibits no edema.       Tender mid thoracic spine and left trapezius muscle  Lymphadenopathy:    He has no cervical adenopathy.  Neurological: He is oriented to person, place, and time. Coordination normal.  Skin: No rash noted. No erythema.  Psychiatric: He has a normal mood and affect. His behavior is normal.    ED Course  Procedures (including critical care time)  Labs Reviewed  CBC WITH DIFFERENTIAL - Abnormal; Notable for the following:    WBC 27.2 (*)     RBC 3.71 (*)     Hemoglobin 11.7 (*)     HCT 34.7 (*)     RDW 15.6 (*)     Neutrophils Relative 80 (*)     Neutro Abs 21.7 (*)     Lymphs Abs 4.1 (*)     Monocytes Absolute 1.4 (*)     All other components within normal limits  BASIC METABOLIC PANEL - Abnormal; Notable for the following:    Sodium 130 (*)     Chloride 89 (*)     Glucose, Bld 213 (*)     BUN 32 (*)     GFR calc non Af Amer 50 (*)     GFR calc Af Amer 57 (*)     All other components within normal limits  TROPONIN I  LACTIC ACID, PLASMA  URINALYSIS, ROUTINE W REFLEX MICROSCOPIC  HEPATIC FUNCTION PANEL  PROTIME-INR   Ct Angio Chest Pe W/cm &/or Wo Cm  07/21/2012  *RADIOLOGY REPORT*  Clinical Data:  79 year old male with chest, abdominal and pelvic pain.  History of mitral valve  replacement and GI bleed.  The patient is not currently on anticoagulation.  CT ANGIOGRAPHY CHEST, ABDOMEN AND PELVIS  Technique:  Multidetector CT imaging through the chest, abdomen and pelvis was performed using the standard protocol during bolus administration of intravenous contrast.  Multiplanar reconstructed images including MIPs were obtained and reviewed to evaluate the vascular anatomy.  Contrast: OMNIPAQUE IOHEXOL 350 MG/ML SOLN  Comparison:  07/09/2011  CTA CHEST  Findings:  Cardiomegaly and mitral valve replacement noted. Moderate coronary artery calcifications are present. And ascending thoracic aortic aneurysm is noted and unchanged measuring 5 cm in greatest diameter.  There is no evidence of dissection.  A small left pleural effusion is noted. There is no evidence of right pleural effusion or pericardial effusion. No enlarged lymph nodes are identified.  Bibasilar atelectasis is noted, left greater than right. There is no evidence of airspace disease, consolidation, mass or endobronchial/endotracheal lesions.   Review of the MIP images confirms the above findings.  IMPRESSION: Unchanged cardiomegaly and ascending thoracic aortic aneurysm measuring 5 cm.  No evidence of dissection or rupture.  Small left pleural effusion with mild bibasilar atelectasis, left greater than right.  CTA ABDOMEN AND PELVIS  Findings:  A large left retroperitoneal hematoma involving the iliopsoas muscle is noted measuring 10 x 10  x 20 cm.  This hematoma lies extremely close to the unchanged 5 cm infrarenal suprailiac abdominal aortic aneurysm, but no definite connection, contrast extravasation, or irregularity of the aortic aneurysm wall is identified.  There is diffuse enlargement of the left iliopsoas musculature rather than displacement. This hematoma is favored to represent a spontaneous retroperitoneal hematoma, but given history of no anticoagulation, an abdominal aortic rupture without evidence of active  arterial extravasation is not entirely excluded.  Cirrhosis is identified. Gallbladder sludge versus stones noted. A 2.2 x 2.5 cm anterior slightly hyperdense splenic lesion is nonspecific but statistically benign. Renal atrophy and bilateral renal cysts are present. The pancreas is unremarkable.  There is no evidence of biliary dilatation, enlarged lymph nodes, or free fluid. The bladder and bowel are within normal limits. No acute or suspicious bony abnormalities are identified. Degenerative changes throughout the spine are present.   Review of the MIP images confirms the above findings.  IMPRESSION: 10 x 10 x 20 cm left retroperitoneal hematoma adjacent to the a 5 cm infrarenal suprailiac abdominal aortic aneurysm.  Given the characteristics discussed above, a spontaneous retroperitoneal hematoma is favored.  A small focal abdominal aortic aneurysm rupture without contrast extravasation, wall irregularity or identifiable connection is a possibility given the fact that this patient is not on anticoagulation.  Cirrhosis.  Gallbladder sludge versus cholelithiasis.  These results were called to Dr. Estell Harpin on 07/21/2012 at 9:15 a.m.   Original Report Authenticated By: Harmon Pier, M.D.    Dg Chest Portable 1 View  07/21/2012  *RADIOLOGY REPORT*  Clinical Data: Chest pain.  PORTABLE CHEST - 1 VIEW  Comparison: 07/13/2012.  Findings: Cardiomegaly.  Slight improved aeration.  Elevated left hemidiaphragm.  Some clearing of edema and atelectasis.  Calcified tortuous aorta.  Prior median sternotomy.  Mild vascular congestion.  IMPRESSION: Cardiomegaly.  Mild vascular congestion.  Slight improvement aeration from previous radiograph.   Original Report Authenticated By: Davonna Belling, M.D.    Ct Angio Abd/pel W/ And/or W/o  07/21/2012  *RADIOLOGY REPORT*  Clinical Data:  79 year old male with chest, abdominal and pelvic pain.  History of mitral valve replacement and GI bleed.  The patient is not currently on  anticoagulation.  CT ANGIOGRAPHY CHEST, ABDOMEN AND PELVIS  Technique:  Multidetector CT imaging through the chest, abdomen and pelvis was performed using the standard protocol during bolus administration of intravenous contrast.  Multiplanar reconstructed images including MIPs were obtained and reviewed to evaluate the vascular anatomy.  Contrast: OMNIPAQUE IOHEXOL 350 MG/ML SOLN  Comparison:  07/09/2011  CTA CHEST  Findings:  Cardiomegaly and mitral valve replacement noted. Moderate coronary artery calcifications are present. And ascending thoracic aortic aneurysm is noted and unchanged measuring 5 cm in greatest diameter.  There is no evidence of dissection.  A small left pleural effusion is noted. There is no evidence of right pleural effusion or pericardial effusion. No enlarged lymph nodes are identified.  Bibasilar atelectasis is noted, left greater than right. There is no evidence of airspace disease, consolidation, mass or endobronchial/endotracheal lesions.   Review of the MIP images confirms the above findings.  IMPRESSION: Unchanged cardiomegaly and ascending thoracic aortic aneurysm measuring 5 cm.  No evidence of dissection or rupture.  Small left pleural effusion with mild bibasilar atelectasis, left greater than right.  CTA ABDOMEN AND PELVIS  Findings:  A large left retroperitoneal hematoma involving the iliopsoas muscle is noted measuring 10 x 10 x 20 cm.  This hematoma lies extremely close to the unchanged  5 cm infrarenal suprailiac abdominal aortic aneurysm, but no definite connection, contrast extravasation, or irregularity of the aortic aneurysm wall is identified.  There is diffuse enlargement of the left iliopsoas musculature rather than displacement. This hematoma is favored to represent a spontaneous retroperitoneal hematoma, but given history of no anticoagulation, an abdominal aortic rupture without evidence of active arterial extravasation is not entirely excluded.  Cirrhosis is  identified. Gallbladder sludge versus stones noted. A 2.2 x 2.5 cm anterior slightly hyperdense splenic lesion is nonspecific but statistically benign. Renal atrophy and bilateral renal cysts are present. The pancreas is unremarkable.  There is no evidence of biliary dilatation, enlarged lymph nodes, or free fluid. The bladder and bowel are within normal limits. No acute or suspicious bony abnormalities are identified. Degenerative changes throughout the spine are present.   Review of the MIP images confirms the above findings.  IMPRESSION: 10 x 10 x 20 cm left retroperitoneal hematoma adjacent to the a 5 cm infrarenal suprailiac abdominal aortic aneurysm.  Given the characteristics discussed above, a spontaneous retroperitoneal hematoma is favored.  A small focal abdominal aortic aneurysm rupture without contrast extravasation, wall irregularity or identifiable connection is a possibility given the fact that this patient is not on anticoagulation.  Cirrhosis.  Gallbladder sludge versus cholelithiasis.  These results were called to Dr. Estell Harpin on 07/21/2012 at 9:15 a.m.   Original Report Authenticated By: Harmon Pier, M.D.      1. AAA (abdominal aortic aneurysm)   2. Back pain       MDM  Dr. [name] Gianotti to accept pt in transfer     CRITICAL CARE Performed by: Annmarie Plemmons L   Total critical care time:45  Critical care time was exclusive of separately billable procedures and treating other patients.  Critical care was necessary to treat or prevent imminent or life-threatening deterioration.  Critical care was time spent personally by me on the following activities: development of treatment plan with patient and/or surrogate as well as nursing, discussions with consultants, evaluation of patient's response to treatment, examination of patient, obtaining history from patient or surrogate, ordering and performing treatments and interventions, ordering and review of laboratory studies, ordering and  review of radiographic studies, pulse oximetry and re-evaluation of patient's condition.  Dr. Frederico Hamman accepted pt for critical care  Benny Lennert, MD 07/21/12 2200  Benny Lennert, MD 07/21/12 1610  Benny Lennert, MD 07/21/12 2211

## 2012-07-22 ENCOUNTER — Encounter (HOSPITAL_COMMUNITY): Payer: Self-pay | Admitting: Internal Medicine

## 2012-07-22 ENCOUNTER — Inpatient Hospital Stay (HOSPITAL_COMMUNITY): Payer: Medicare Other

## 2012-07-22 ENCOUNTER — Other Ambulatory Visit (HOSPITAL_COMMUNITY): Payer: Medicare Other

## 2012-07-22 DIAGNOSIS — I4891 Unspecified atrial fibrillation: Secondary | ICD-10-CM

## 2012-07-22 DIAGNOSIS — I2510 Atherosclerotic heart disease of native coronary artery without angina pectoris: Secondary | ICD-10-CM

## 2012-07-22 DIAGNOSIS — D509 Iron deficiency anemia, unspecified: Secondary | ICD-10-CM

## 2012-07-22 DIAGNOSIS — R109 Unspecified abdominal pain: Secondary | ICD-10-CM

## 2012-07-22 DIAGNOSIS — C61 Malignant neoplasm of prostate: Secondary | ICD-10-CM

## 2012-07-22 DIAGNOSIS — R58 Hemorrhage, not elsewhere classified: Secondary | ICD-10-CM

## 2012-07-22 DIAGNOSIS — J9610 Chronic respiratory failure, unspecified whether with hypoxia or hypercapnia: Secondary | ICD-10-CM

## 2012-07-22 DIAGNOSIS — I5032 Chronic diastolic (congestive) heart failure: Secondary | ICD-10-CM

## 2012-07-22 DIAGNOSIS — I719 Aortic aneurysm of unspecified site, without rupture: Secondary | ICD-10-CM

## 2012-07-22 DIAGNOSIS — I509 Heart failure, unspecified: Secondary | ICD-10-CM

## 2012-07-22 DIAGNOSIS — I7090 Unspecified atherosclerosis: Secondary | ICD-10-CM

## 2012-07-22 DIAGNOSIS — R7309 Other abnormal glucose: Secondary | ICD-10-CM

## 2012-07-22 LAB — CBC
HCT: 30.3 % — ABNORMAL LOW (ref 39.0–52.0)
HCT: 26.8 % — ABNORMAL LOW (ref 39.0–52.0)
Hemoglobin: 10.2 g/dL — ABNORMAL LOW (ref 13.0–17.0)
Hemoglobin: 8.9 g/dL — ABNORMAL LOW (ref 13.0–17.0)
MCH: 31.7 pg (ref 26.0–34.0)
MCH: 31.3 pg (ref 26.0–34.0)
MCHC: 33.7 g/dL (ref 30.0–36.0)
MCHC: 33.2 g/dL (ref 30.0–36.0)
MCV: 94.1 fL (ref 78.0–100.0)
MCV: 94.4 fL (ref 78.0–100.0)
Platelets: 218 K/uL (ref 150–400)
Platelets: 211 10*3/uL (ref 150–400)
Platelets: 200 K/uL (ref 150–400)
RBC: 3.22 MIL/uL — ABNORMAL LOW (ref 4.22–5.81)
RBC: 3.07 MIL/uL — ABNORMAL LOW (ref 4.22–5.81)
RBC: 2.84 MIL/uL — ABNORMAL LOW (ref 4.22–5.81)
RDW: 15.8 % — ABNORMAL HIGH (ref 11.5–15.5)
RDW: 16.1 % — ABNORMAL HIGH (ref 11.5–15.5)
WBC: 27.1 K/uL — ABNORMAL HIGH (ref 4.0–10.5)
WBC: 27.7 10*3/uL — ABNORMAL HIGH (ref 4.0–10.5)
WBC: 28 K/uL — ABNORMAL HIGH (ref 4.0–10.5)

## 2012-07-22 LAB — GLUCOSE, CAPILLARY
Glucose-Capillary: 158 mg/dL — ABNORMAL HIGH (ref 70–99)
Glucose-Capillary: 147 mg/dL — ABNORMAL HIGH (ref 70–99)
Glucose-Capillary: 144 mg/dL — ABNORMAL HIGH (ref 70–99)

## 2012-07-22 LAB — BLOOD GAS, ARTERIAL
Drawn by: 10006
O2 Content: 4 L/min
O2 Saturation: 95.2 %
Patient temperature: 99.2
pH, Arterial: 7.395 (ref 7.350–7.450)
pO2, Arterial: 80.4 mmHg (ref 80.0–100.0)

## 2012-07-22 LAB — BASIC METABOLIC PANEL WITH GFR
BUN: 33 mg/dL — ABNORMAL HIGH (ref 6–23)
CO2: 31 meq/L (ref 19–32)
Calcium: 8 mg/dL — ABNORMAL LOW (ref 8.4–10.5)
Chloride: 95 meq/L — ABNORMAL LOW (ref 96–112)
Creatinine, Ser: 1.29 mg/dL (ref 0.50–1.35)
GFR calc Af Amer: 55 mL/min — ABNORMAL LOW
GFR calc non Af Amer: 48 mL/min — ABNORMAL LOW
Glucose, Bld: 163 mg/dL — ABNORMAL HIGH (ref 70–99)
Potassium: 5.2 meq/L — ABNORMAL HIGH (ref 3.5–5.1)
Sodium: 135 meq/L (ref 135–145)

## 2012-07-22 LAB — URINALYSIS, ROUTINE W REFLEX MICROSCOPIC
Glucose, UA: NEGATIVE mg/dL
Hgb urine dipstick: NEGATIVE
Leukocytes, UA: NEGATIVE
Protein, ur: NEGATIVE mg/dL
Specific Gravity, Urine: 1.027 (ref 1.005–1.030)
Urobilinogen, UA: 1 mg/dL (ref 0.0–1.0)

## 2012-07-22 LAB — PROTIME-INR
INR: 1.28 (ref 0.00–1.49)
Prothrombin Time: 15.7 s — ABNORMAL HIGH (ref 11.6–15.2)

## 2012-07-22 MED ORDER — POTASSIUM CHLORIDE CRYS ER 20 MEQ PO TBCR: 20.0000 meq | EXTENDED_RELEASE_TABLET | Freq: Once | ORAL | Status: DC

## 2012-07-22 MED ORDER — METOPROLOL TARTRATE 1 MG/ML IV SOLN
2.0000 mg | INTRAVENOUS | Status: DC | PRN
  Filled 2012-07-22: qty 5

## 2012-07-22 MED ORDER — MORPHINE SULFATE 2 MG/ML IJ SOLN
2.0000 mg | INTRAMUSCULAR | Status: DC | PRN
  Administered 2012-07-22 – 2012-07-27 (×11): 2 mg via INTRAVENOUS
  Filled 2012-07-22 (×11): qty 1

## 2012-07-22 MED ORDER — HYDRALAZINE HCL 20 MG/ML IJ SOLN
10.0000 mg | INTRAMUSCULAR | Status: DC | PRN
  Filled 2012-07-22: qty 0.5

## 2012-07-22 MED ORDER — ONDANSETRON HCL 4 MG/2ML IJ SOLN: 4.0000 mg | Freq: Four times a day (QID) | INTRAMUSCULAR | Status: DC | PRN

## 2012-07-22 MED ORDER — INSULIN ASPART 100 UNIT/ML ~~LOC~~ SOLN
0.0000 [IU] | Freq: Three times a day (TID) | SUBCUTANEOUS | Status: DC
  Administered 2012-07-22 (×3): 2 [IU] via SUBCUTANEOUS
  Administered 2012-07-23 (×2): 1 [IU] via SUBCUTANEOUS
  Administered 2012-07-23 – 2012-07-25 (×2): 2 [IU] via SUBCUTANEOUS
  Administered 2012-07-26 – 2012-07-28 (×2): 1 [IU] via SUBCUTANEOUS

## 2012-07-22 MED ORDER — LABETALOL HCL 5 MG/ML IV SOLN
10.0000 mg | INTRAVENOUS | Status: DC | PRN
  Filled 2012-07-22: qty 4

## 2012-07-22 MED ORDER — MOMETASONE FURO-FORMOTEROL FUM 100-5 MCG/ACT IN AERO
2.0000 | INHALATION_SPRAY | Freq: Two times a day (BID) | RESPIRATORY_TRACT | Status: DC
  Administered 2012-07-22 – 2012-07-28 (×12): 2 via RESPIRATORY_TRACT
  Filled 2012-07-22: qty 8.8

## 2012-07-22 MED ORDER — PHENOL 1.4 % MT LIQD: 1.0000 | OROMUCOSAL | Status: DC | PRN

## 2012-07-22 MED ORDER — SODIUM CHLORIDE 0.9 % IV SOLN
INTRAVENOUS | Status: DC
  Administered 2012-07-22: 08:00:00 via INTRAVENOUS

## 2012-07-22 MED ORDER — METOPROLOL TARTRATE 1 MG/ML IV SOLN
2.5000 mg | Freq: Four times a day (QID) | INTRAVENOUS | Status: DC
  Administered 2012-07-22 – 2012-07-23 (×5): 2.5 mg via INTRAVENOUS
  Filled 2012-07-22 (×8): qty 5

## 2012-07-22 MED ORDER — INSULIN ASPART 100 UNIT/ML ~~LOC~~ SOLN: 0.0000 [IU] | Freq: Every day | SUBCUTANEOUS | Status: DC

## 2012-07-22 MED ORDER — PANTOPRAZOLE SODIUM 40 MG IV SOLR
40.0000 mg | INTRAVENOUS | Status: DC
  Administered 2012-07-22: 40 mg via INTRAVENOUS
  Filled 2012-07-22 (×2): qty 40

## 2012-07-22 MED ORDER — SODIUM CHLORIDE 0.9 % IV SOLN
INTRAVENOUS | Status: DC
  Administered 2012-07-23 (×2): via INTRAVENOUS
  Administered 2012-07-24: 100 mL/h via INTRAVENOUS

## 2012-07-22 MED ORDER — ALBUTEROL SULFATE (5 MG/ML) 0.5% IN NEBU: 2.5000 mg | INHALATION_SOLUTION | Freq: Four times a day (QID) | RESPIRATORY_TRACT | Status: DC | PRN

## 2012-07-22 MED ORDER — IPRATROPIUM BROMIDE 0.02 % IN SOLN: 500.0000 ug | Freq: Four times a day (QID) | RESPIRATORY_TRACT | Status: DC | PRN

## 2012-07-22 NOTE — Progress Notes (Signed)
PULMONARY  / CRITICAL CARE MEDICINE  Name: [name] MRN: [id number] DOB: Apr 23, 1922    REFERRING MD :  Coral Else  CHIEF COMPLAINT:  Flank pain.  BRIEF PATIENT DESCRIPTION:  79 yo male former smoker admitted on 07/21/2012 with flank pain from retroperitoneal hematoma.  PCCM consulted 1/29 for medical management while in ICU.  He was in hospital 1/19 to 1/22 for AECOPD.  Significant PMHx of COPD, Chronic hypoxic respiratory failure (3L oxygen 24/7),  HTN, Hyperlipidemia, CAD s/p CABG, MVP s/p MVR, A fib, CHF, Prostate cancer, DM, AAA, Thoracic aortic aneurysm, PUD   SIGNIFICANT EVENTS: 1/28 Transfer from [facility] to [facility]  STUDIES:  CT chest 1/28 >> 5 cm ascending thoracic aortic aneurysm no change, small Lt effusion with ATX CT abd/pelvis 1/28 >> 10x10x20 cm Lt retroperitoneal hematoma of iliopsoas muscle, 5 cm infrarenal AAA, changes of cirrhosis, gallbladder sludge   SUBJECTIVE:  Abdominal pain better.  Denies chest pain.  Breathing okay, and not much chest congestion.  VITAL SIGNS: Temp:  [97.7 F (36.5 C)-99.2 F (37.3 C)] 97.7 F (36.5 C) (01/29 0757) Pulse Rate:  [50-97] 79  (01/29 0800) Resp:  [12-26] 20  (01/29 0800) BP: (87-118)/(43-85) 106/59 mmHg (01/29 0800) SpO2:  [92 %-97 %] 96 % (01/29 0810) Weight:  [235 lb (106.595 kg)] 235 lb (106.595 kg) (01/29 0200)  PHYSICAL EXAMINATION: General:  No distress Neuro:  Alert, normal strength, follows commands HEENT:  No sinus tenderness Cardiovascular:  s1s2 irregular Lungs:  Decreased breath sounds, no wheeze Abdomen:  Soft, tender Lt flank Musculoskeletal:  No edema Skin:  No rashes   Lab 07/22/12 0158 07/21/12 1937  NA 135 130*  K 5.2* 5.0  CL 95* 89*  CO2 31 31  BUN 33* 32*  CREATININE 1.29 1.25  GLUCOSE 163* 213*    Lab 07/22/12 0158 07/21/12 1937  HGB 10.2* 11.7*  HCT 30.3* 34.7*  WBC 27.1* 27.2*  PLT 218 238   Ct Abdomen Pelvis Wo Contrast  07/22/2012  *RADIOLOGY REPORT*  Clinical Data:  Retroperitoneal bleed  CT ABDOMEN AND PELVIS WITHOUT CONTRAST  Technique:  Multidetector CT imaging of the abdomen and pelvis was performed following the standard protocol without intravenous contrast.  Comparison: 07/13/2012  Findings: Cardiomegaly.  Mitral valve replacement.  Trace left pleural effusion.  Bibasilar opacities, likely atelectasis.  Organ abnormality/lesion detection is limited in the absence of intravenous contrast. Within this limitation, lobular liver contour.  Gallstones.  Splenic granuloma.  Unremarkable pancreas and adrenal glands.  Bilateral retained renal contrast with delayed excretion. Bilateral renal cystic lesions, some of which are cysts and some which are incompletely characterized.  No hydronephrosis or hydroureter  Colonic diverticulosis. Decompressed small bowel loops. Surgical clip right lower quadrant may reflect appendectomy.  Retroperitoneal hematoma centered on the left, measuring up to 12 x 10.7 cm in axial dimensions (series 2 image 47) and 21.6 cm craniocaudal on coronal image 62, similar to slightly larger since the prior allowing for differences in slice selection.  Again, the hematoma is noted to extend anterior to the aorta, with small amount collecting along the small bowel mesenteric root and right retroperitoneum. The quantity collecting on the right has increased in the interval though remains ill defined/difficult to accurately measure.  Superiorly, hematoma abuts the posterior margin of the spleen.  Inferiorly, the hematoma extends along the left greater than right iliac vasculature. There is a preserved fat plane separating the majority of the hematoma from the aorta, with the exception of the component  crossing midline, which is inseparable from the anterior margin of the aorta.  Scattered atherosclerosis of the aorta and branch vessels. Infrarenal aneurysm measures up to 5 cm in diameter, unchanged.  Contrast collects within the bladder.  Multiple bladder  diverticula.  Circumferential wall thickening.  TURP defect. Lobular prostate gland.  Osteopenia and multilevel degenerative changes without acute osseous finding.  IMPRESSION: Large retroperitoneal hematoma centered on the left. The dominant component is similar to minimally larger in size. There is a small amount of blood crossing midline and tracking along the right retroperitoneum, increased in the interval.  Unchanged differential.  Discussed via telephone with Dr. [name] Gianotti at 05:00 a.m. on 07/22/2012.  Retained contrast within the kidneys bilaterally.  Correlate with serial creatinine for contrast induced nephropathy.  Circumferential bladder wall thickening with multiple diverticula suggest chronic partial outlet obstruction.   Original Report Authenticated By: Jearld Lesch, M.D.    Ct Angio Chest Pe W/cm &/or Wo Cm  07/21/2012  *RADIOLOGY REPORT*  Clinical Data:  79 year old male with chest, abdominal and pelvic pain.  History of mitral valve replacement and GI bleed.  The patient is not currently on anticoagulation.  CT ANGIOGRAPHY CHEST, ABDOMEN AND PELVIS  Technique:  Multidetector CT imaging through the chest, abdomen and pelvis was performed using the standard protocol during bolus administration of intravenous contrast.  Multiplanar reconstructed images including MIPs were obtained and reviewed to evaluate the vascular anatomy.  Contrast: OMNIPAQUE IOHEXOL 350 MG/ML SOLN  Comparison:  07/09/2011  CTA CHEST  Findings:  Cardiomegaly and mitral valve replacement noted. Moderate coronary artery calcifications are present. And ascending thoracic aortic aneurysm is noted and unchanged measuring 5 cm in greatest diameter.  There is no evidence of dissection.  A small left pleural effusion is noted. There is no evidence of right pleural effusion or pericardial effusion. No enlarged lymph nodes are identified.  Bibasilar atelectasis is noted, left greater than right. There is no evidence of airspace  disease, consolidation, mass or endobronchial/endotracheal lesions.   Review of the MIP images confirms the above findings.  IMPRESSION: Unchanged cardiomegaly and ascending thoracic aortic aneurysm measuring 5 cm.  No evidence of dissection or rupture.  Small left pleural effusion with mild bibasilar atelectasis, left greater than right.  CTA ABDOMEN AND PELVIS  Findings:  A large left retroperitoneal hematoma involving the iliopsoas muscle is noted measuring 10 x 10 x 20 cm.  This hematoma lies extremely close to the unchanged 5 cm infrarenal suprailiac abdominal aortic aneurysm, but no definite connection, contrast extravasation, or irregularity of the aortic aneurysm wall is identified.  There is diffuse enlargement of the left iliopsoas musculature rather than displacement. This hematoma is favored to represent a spontaneous retroperitoneal hematoma, but given history of no anticoagulation, an abdominal aortic rupture without evidence of active arterial extravasation is not entirely excluded.  Cirrhosis is identified. Gallbladder sludge versus stones noted. A 2.2 x 2.5 cm anterior slightly hyperdense splenic lesion is nonspecific but statistically benign. Renal atrophy and bilateral renal cysts are present. The pancreas is unremarkable.  There is no evidence of biliary dilatation, enlarged lymph nodes, or free fluid. The bladder and bowel are within normal limits. No acute or suspicious bony abnormalities are identified. Degenerative changes throughout the spine are present.   Review of the MIP images confirms the above findings.  IMPRESSION: 10 x 10 x 20 cm left retroperitoneal hematoma adjacent to the a 5 cm infrarenal suprailiac abdominal aortic aneurysm.  Given the characteristics discussed above, a  spontaneous retroperitoneal hematoma is favored.  A small focal abdominal aortic aneurysm rupture without contrast extravasation, wall irregularity or identifiable connection is a possibility given the fact that  this patient is not on anticoagulation.  Cirrhosis.  Gallbladder sludge versus cholelithiasis.  These results were called to Dr. Estell Harpin on 07/21/2012 at 9:15 a.m.   Original Report Authenticated By: Harmon Pier, M.D.    Dg Chest Port 1 View  07/22/2012  *RADIOLOGY REPORT*  Clinical Data: Left pleural effusion.  PORTABLE CHEST - 1 VIEW  Comparison: 01/28 and 07/13/2012  Findings: There is chronic cardiomegaly.  Pulmonary vascularity is now normal.  Right lung is clear.  Increased density at the left lung base consistent with a combination of pleural effusion and atelectasis.  IMPRESSION:  Increased atelectasis/small effusion at the left lung base. Pulmonary vascularity is now normal.   Original Report Authenticated By: Francene Boyers, M.D.    Dg Chest Portable 1 View  07/21/2012  *RADIOLOGY REPORT*  Clinical Data: Chest pain.  PORTABLE CHEST - 1 VIEW  Comparison: 07/13/2012.  Findings: Cardiomegaly.  Slight improved aeration.  Elevated left hemidiaphragm.  Some clearing of edema and atelectasis.  Calcified tortuous aorta.  Prior median sternotomy.  Mild vascular congestion.  IMPRESSION: Cardiomegaly.  Mild vascular congestion.  Slight improvement aeration from previous radiograph.   Original Report Authenticated By: Davonna Belling, M.D.    Ct Angio Abd/pel W/ And/or W/o  07/21/2012  *RADIOLOGY REPORT*  Clinical Data:  79 year old male with chest, abdominal and pelvic pain.  History of mitral valve replacement and GI bleed.  The patient is not currently on anticoagulation.  CT ANGIOGRAPHY CHEST, ABDOMEN AND PELVIS  Technique:  Multidetector CT imaging through the chest, abdomen and pelvis was performed using the standard protocol during bolus administration of intravenous contrast.  Multiplanar reconstructed images including MIPs were obtained and reviewed to evaluate the vascular anatomy.  Contrast: OMNIPAQUE IOHEXOL 350 MG/ML SOLN  Comparison:  07/09/2011  CTA CHEST  Findings:  Cardiomegaly and mitral  valve replacement noted. Moderate coronary artery calcifications are present. And ascending thoracic aortic aneurysm is noted and unchanged measuring 5 cm in greatest diameter.  There is no evidence of dissection.  A small left pleural effusion is noted. There is no evidence of right pleural effusion or pericardial effusion. No enlarged lymph nodes are identified.  Bibasilar atelectasis is noted, left greater than right. There is no evidence of airspace disease, consolidation, mass or endobronchial/endotracheal lesions.   Review of the MIP images confirms the above findings.  IMPRESSION: Unchanged cardiomegaly and ascending thoracic aortic aneurysm measuring 5 cm.  No evidence of dissection or rupture.  Small left pleural effusion with mild bibasilar atelectasis, left greater than right.  CTA ABDOMEN AND PELVIS  Findings:  A large left retroperitoneal hematoma involving the iliopsoas muscle is noted measuring 10 x 10 x 20 cm.  This hematoma lies extremely close to the unchanged 5 cm infrarenal suprailiac abdominal aortic aneurysm, but no definite connection, contrast extravasation, or irregularity of the aortic aneurysm wall is identified.  There is diffuse enlargement of the left iliopsoas musculature rather than displacement. This hematoma is favored to represent a spontaneous retroperitoneal hematoma, but given history of no anticoagulation, an abdominal aortic rupture without evidence of active arterial extravasation is not entirely excluded.  Cirrhosis is identified. Gallbladder sludge versus stones noted. A 2.2 x 2.5 cm anterior slightly hyperdense splenic lesion is nonspecific but statistically benign. Renal atrophy and bilateral renal cysts are present. The pancreas is unremarkable.  There is no evidence of biliary dilatation, enlarged lymph nodes, or free fluid. The bladder and bowel are within normal limits. No acute or suspicious bony abnormalities are identified. Degenerative changes throughout the spine  are present.   Review of the MIP images confirms the above findings.  IMPRESSION: 10 x 10 x 20 cm left retroperitoneal hematoma adjacent to the a 5 cm infrarenal suprailiac abdominal aortic aneurysm.  Given the characteristics discussed above, a spontaneous retroperitoneal hematoma is favored.  A small focal abdominal aortic aneurysm rupture without contrast extravasation, wall irregularity or identifiable connection is a possibility given the fact that this patient is not on anticoagulation.  Cirrhosis.  Gallbladder sludge versus cholelithiasis.  These results were called to Dr. Estell Harpin on 07/21/2012 at 9:15 a.m.   Original Report Authenticated By: Harmon Pier, M.D.    CBG (last 3)   Basename 07/22/12 0755 07/22/12 0053  GLUCAP 158* 163*     ASSESSMENT / PLAN:  Lt flank pain 2nd to retroperitoneal hematoma with Hx of AAA. Plan: Per [facility]  COPD with recent exacerbation. Completed Abx and prednisone 1/24. Chronic hypoxic respiratory failure. Lt pleural effusion. Plan: Continue dulera PRN albuterol/ipratropium F/u CXR 1/30 Adjust oxygen to keep SpO2 > 92%  Hx of CAD, HTN, A fib, MVR, hyperlipidemia, chronic diastolic CHF. Not coumadin candidate.  Followed by [facility] cardiology as outpt. 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 Plan: SSI  Anemia. Likely from chronic disease, hx of iron deficiency, and retroperitoneal hematoma.  Followed by hematology as outpt [name]) Plan: CBC q8h SCD for DVT prophylaxis  Nutrition. Plan: NPO until okay with [facility] to advance diet Continue IV fluid  Hyperkalemia. Plan: F/u electrolytes  Hx of prostate cancer. Plan: Hold flomax for now   Hx of PUD. Plan: Protonix IV  Changes of cirrhosis noted on CT abd 1/28. Plan: Monitor LFT's  Resolved problems >> Hyponatremia, hypotension  Coralyn Helling, MD [facility] Pulmonary/Critical  Care 07/22/2012, 10:00 AM Pager:  [phone] After 3pm call: [phone]

## 2012-07-22 NOTE — Progress Notes (Addendum)
>  [facility] Progress Note:  Pt C/O abdominal pain especially in superpubic area - unable to void this am. Had 275cc overnight.  Lab Results  Component Value Date   WBC 27.7* 07/22/2012   HGB 9.6* 07/22/2012   HCT 28.8* 07/22/2012   MCV 93.8 07/22/2012   PLT 211 07/22/2012   Filed Vitals:   07/22/12 0700 07/22/12 0757 07/22/12 0800 07/22/12 0810  BP: 105/74  106/59   Pulse: 71  79   Temp:  97.7 F (36.5 C)    TempSrc:  Oral    Resp: 20  20   Height:      Weight:      SpO2: 95%  94% 96%   PE: HR 90-100's with metoprolol on board Pt sleeping, resting comfortably abd soft. Pt C/O tenderness over all of lower quadrant and superpubic area to light palpation BLE warm_ with good sensation and motion Biphasic doppler flow in PT, monophasic DP on left  Assessment/plan: Pt with retroperitoneal hematoma and known AAA HCT drifting down but may be delusional Continued Abd pain -will bladder scan and place foley if bladder full which may relieve some of his pain Acute anemia - stable  I agree with the above. The patient has been seen and examined. Overnight, I repeated his CAT scan as his abdomen was appearing more full. The noncontrasted CAT scan showed a slight increase in the retroperitoneal hematoma, now becoming more prominent on the right side. I have reviewed extensively his CAT scan with the radiologist including our interventional radiologist. There appears to be a blush of contrast within the so as muscle. This along with the different densities within the so as muscle are very convincing for a spontaneous retroperitoneal hematoma. In addition, there isn't a clean fat plane around the aorta, suggesting that this has not ruptured. At this point, I would recommend close evaluation. If the patient continues to do well I would consider endovascular aneurysm repair 2 remove the possibility of his aneurysm contributing to his blood loss. However, at this time I would like for him to become more  comfortable and have his retroperitoneal hematoma coagulate, as he would need heparin with his operation.

## 2012-07-22 NOTE — H&P (Signed)
[facility] [place]      History and Physical  Patient name: [name] MRN: [id number] DOB: [birth year] Sex: male   Reason for Admission:  Chief Complaint  Patient presents with  . Abdominal Pain    HISTORY OF PRESENT ILLNESS: This is an 79 yo male well known to me, having undergone L LE stenting for limb salvage.  I have been following a infrarenal aneurysm.  I last saw him in )ctober of 2013.  At that time, ultrasound revealed a 4.6 cm AAA, which was essentially unchanged from a CT scan in January of 2013.  According to his daughter, he was participating in physical therapy and doing vigorous activities with weights and a lot of trunk twisting.  He developed a large knot on his flank and buttocks on Monday which was very painful.  His daughter massaged it and it felt better.  He had trouble sleeping from pain on Tuesday night, and mid day Tuesday he called his HHN and stated that his pain was worse and that he was having pain in his back when he moved his legs.  He was instructed to call 911. He was taken to [facility] via ambulance.  A Ct scan revealed his aneurysm to be about 5cm, and there was a retroperitoneal hematoma on the left.  It was felt that this likely represented a spontaneous retroperitoneal hematoma and not a ruptured AAA.  He initially had issues with hypotension, however he quickly stabilized with fluid. He was transferred to [facility] for further evaluation and care.  The patient was recently in the hospital for respiratory insufficiency, felt to be due to a COPD exacerbation vs CHF.  He had an episode of AFIB with RVR.  It was rate controlled with PO cardizem.  He is not a candidate for anti-coagulation because of GI bleeding.  He is on home oxygen  Past Medical History  Diagnosis Date  . Mitral valve prolapse 2003    MVR in 2003  . Obesity   . Osteoarthritis   . Hypertension   . Hyperlipidemia   . COPD (chronic obstructive pulmonary disease)   .  CHF (congestive heart failure) diastolic 10/2010    CXR in 12/2010: Prior CABG, cardiomegaly, vascular redistribution, bibasilar atelectasis, small effusions  . Pneumonia   . Impaired glucose tolerance   . Atrial fibrillation, chronic   . Prostate cancer     elevated PSA  . GERD (gastroesophageal reflux disease)     + hiatal hernia  . Arteriosclerotic cardiovascular disease (ASCVD) 1994    stent to RCA; CABG-2003  . Hard of hearing   . Anemia, iron deficiency   . AAA (abdominal aortic aneurysm)     Fusiform; infrarenal; 4-4.1cm on CT in 07/2008 and 12/2010 by MRI  . Gastric ulcer 2004    2004; upper GI bleed  . Renal cysts, acquired, bilateral     Complex by MRI in 12/2010  . Adenomatous polyps 04/07/2012  . HYPERTENSION 12/17/2007    Subsequently hypotensive and all antihypertensive medication discontinued Lab  03/2012: Mild anemia with H&H-11.8/37.5, MCV-97, ferritin-224, normal CMet ex G-124, alb-3.4   . NSTEMI (non-ST elevated myocardial infarction) 01/18/2011  . Diabetes mellitus, type II 07/13/12    family denies patient is diabetic    Past Surgical History  Procedure Date  . Knee surgery     Left  . Bladder surgery   . Femoral artery stent 12-06-10    Left SFA  . Appendectomy   .  Mitral valve replacement (mvr)/coronary artery bypass grafting (cabg) 2003    stent to RCA in 1994  . Eye surgery 2007    bilateral cataracts  . Tonsillectomy     thinks they were removed while in the [employer]  . Mass excision 06/07/2011    Procedure: EXCISION MASS;  Surgeon: Fabio Bering;  Location: [facility];  Service: General;  Laterality: N/A;  excision of 2 masses back and buttocks  . Back surgery   . Esophagogastroduodenoscopy 03/03/2003    Large, deep prepyloric ulcer, as described above without bleeding/ Normal esophagus  . Esophagogastroduodenoscopy 09/03/2004    normal throughout  . Colonoscopy 09/03/2004    small ulcer, without stigmata of bleeding  . Esophagogastroduodenoscopy  11/14/2004    Normal esophagus and small hiatal hernia, otherwise normal stomach   . Colonoscopy 11/14/2004    Internal hemorrhoids, otherwise normal rectum/ left-sided diverticula , diffusely oozing right colon mucosa without a discrete lesion amenable to endoscopic therapy. 2 diminutive polyps. FELT TO HAVE AVMs/telangiectasias  . Colonoscopy 01/29/2005    Normal rectum  . Colonoscopy 03/11/2012    Colonic diverticulosis. Colonic polyps-removed as described above. Vascular anomalies in the cecum likely representing hemangiomas. Status post hemostasis clipping of  2 of the 3. ADENOMATOUS POLYPS. Repeat 2016  . Esophagogastroduodenoscopy 03/11/2012    Deformity of the antrum;  small polyp in antrum-not manipulated. Otherwise normal exam    History   Social History  . Marital Status: Widowed    Spouse Name: N/A    Number of Children: 4  . Years of Education: N/A   Occupational History  . Retired    Social History Main Topics  . Smoking status: Former Smoker -- 1.0 packs/day for 70 years    Types: Cigarettes    Quit date: 06/25/2007  . Smokeless tobacco: Former Neurosurgeon  . Alcohol Use: No  . Drug Use: No  . Sexually Active: Not Currently   Other Topics Concern  . Not on file   Social History Narrative   Has 3/4 children living.1 son deceased at age 42 due to drunk driving.Widow since 1966, lives with a daughter who is responsible for his careQuit smoking in 2010, no alchol for over 10 years though he "loves it"Retired from [employer], after 10 years in 1951. 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 Stroke Mother   . Stroke Brother   . Heart attack Brother   . Cancer Brother   . Cancer Brother   . Anesthesia problems Neg Hx   . Hypotension Neg Hx   . Malignant hyperthermia Neg Hx   . Pseudochol deficiency Neg Hx     Allergies as of 07/21/2012 - Review Complete 07/13/2012  Allergen Reaction Noted  . Gabapentin   06/04/2011    No current facility-administered medications on file prior to encounter.   Current Outpatient Prescriptions on File Prior to Encounter  Medication Sig Dispense Refill  . albuterol (PROVENTIL) (2.5 MG/3ML) 0.083% nebulizer solution Take 3 mLs (2.5 mg total) by nebulization every 6 (six) hours as needed. Mixes with the Ipratropium(for shortness of breath)  300 mL  1  . diltiazem (CARDIZEM) 120 MG tablet Take 1 tablet (120 mg total) by mouth 4 (four) times daily.  30 tablet  0  . Fluticasone-Salmeterol (ADVAIR) 100-50 MCG/DOSE AEPB Inhale 1 puff into the lungs every 12 (twelve) hours.      . furosemide (LASIX)  20 MG tablet Take 60 mg by mouth daily.      Marland Kitchen ipratropium (ATROVENT) 0.02 % nebulizer solution Take 500 mcg by nebulization every 6 (six) hours as needed. Shortness of breath      . levofloxacin (LEVAQUIN) 750 MG tablet Take 1 tablet (750 mg total) by mouth daily.  2 tablet  0  . lovastatin (MEVACOR) 40 MG tablet Take 40 mg by mouth at bedtime. Only takes on Sun, Mon, Wed, and Fri.      Marland Kitchen omeprazole (PRILOSEC) 40 MG capsule Take 40 mg by mouth daily.      Marland Kitchen oxymetazoline (AFRIN) 0.05 % nasal spray Place 1 spray into the nose daily as needed. Congestion      . potassium chloride SA (K-DUR,KLOR-CON) 20 MEQ tablet Take 20 mEq by mouth 2 (two) times daily.      . predniSONE (DELTASONE) 10 MG tablet 4 tablets daily for 2 days then decrease by one tablet daily every 2 days until off  20 tablet  0  . Tamsulosin HCl (FLOMAX) 0.4 MG CAPS Take 1 capsule (0.4 mg total) by mouth daily.  30 capsule  0     REVIEW OF SYSTEMS: Cardiovascular: No chest pain, chest pressure,  Pulmonary: No productive cough, asthma or wheezing. On home O2 Neurologic: No weakness, paresthesias, aphasia, or amaurosis. No dizziness. Hematologic: see above Musculoskeletal: No joint pain or joint swelling. Gastrointestinal: abdominal and back pain Genitourinary: No dysuria or hematuria. Psychiatric:: No  history of major depression. Integumentary: No rashes or ulcers. Constitutional: No fever or chills.  PHYSICAL EXAMINATION: General: The patient appears their stated age.  Vital signs are BP 118/67  Pulse 50  Temp 97.9 F (36.6 C) (Oral)  Resp 17  SpO2 95% HEENT:  No gross abnormalities Pulmonary: Respirations are non-labored Abdomen: Soft. Tender to palpation Musculoskeletal: There are no major deformities.   Neurologic: No focal weakness or paresthesias are detected, Skin: There are no ulcer or rashes noted. Psychiatric: The patient has normal affect. Cardiovascular: tachycardiac to 110's, irreg rhythm. Ext warm with edema  Diagnostic Studies: ONG:EXBMWUXLKG:  10 x 10 x 20 cm left retroperitoneal hematoma adjacent to the a 5  cm infrarenal suprailiac abdominal aortic aneurysm. Given the  characteristics discussed above, a spontaneous retroperitoneal  hematoma is favored. A small focal abdominal aortic aneurysm  rupture without contrast extravasation, wall irregularity or  identifiable connection is a possibility given the fact that this  patient is not on anticoagulation.  Cirrhosis.  Gallbladder sludge versus cholelithiasis.   Assessment:  Retroperitoneal hematoma Plan: I spent 45 minutes with both the patient and his daughter who is his POA.  This is a very complicated situation.  He is a very poor operative candidate due to his underlying cardiac and pulmonary status.  After reviewing his CT scan, I feel that this is most likely a spontaneous RP hematoma, which was likely caused by his vigorous physical therpy.  On his CT scan, there is a clear plane between his aorta and the hematoma, which looks like it is arising from his psoas muscle.  I told the patient that currently we can not be 100% sure that this is not due to his AAA, but I would favor spontaneous RP hematoma.  Since he is a very high risk operative candidate, I would favor close observation.  If he becomes  unstable, I would have to fix his AAA, however at this point, I am not sure this is the etiology of the bleed.  They understand the risk of death if we watch this and the bleed is due to his AAA.  However, I am not sure he would survive the operation,and if at all possible, this would be be repaired electively, when he has been "tuned up" medically.  He will be hydrated overnight, since he as received IV dye for his CT scan and he has a history of renal insufficiency.  His blood pressure will be maintained around SBP 100.  He will receive serial CBC to monitor his blood counts.     Jorge Ny, M.D. [facility] [place] Office: [phone] Pager:  [phone]

## 2012-07-22 NOTE — Progress Notes (Signed)
eLink Physician-Brief Progress Note Patient Name: AKITO BOOMHOWER DOB: 1922-01-23 MRN: [id number]  Date of Service  07/22/2012   HPI/Events of Note  Best Practice   eICU Interventions  Order for SCDs for DVT propy   Intervention Category Intermediate Interventions: Best-practice therapies (e.g. DVT, beta blocker, etc.)  DETERDING,ELIZABETH 07/22/2012, 1:57 AM

## 2012-07-22 NOTE — Consult Note (Signed)
PULMONARY  / CRITICAL CARE MEDICINE  Name: [name] MRN: [id number] DOB: 1922-04-05    ADMISSION DATE:  07/21/2012 CONSULTATION DATE:  07/22/12  REFERRING MD :  CTS  CHIEF COMPLAINT:  Abdominal pain / AAA  BRIEF PATIENT DESCRIPTION: 79 y.o with abdominal pain with CT proven 5 cm AAA with large 10 x 10 x 20 cm left hematoma.  Concern for spontaneous retroperitoneal bleed.  Hemodynamically stable with chronic atrial fibrillation (Currently AF) without anticoagulation.  CCM consulted for medical management  SIGNIFICANT EVENTS / STUDIES:  1/28-Admitted to CTS service   LINES / TUBES:  CULTURES:  ANTIBIOTICS:  HISTORY OF PRESENT ILLNESS:  79 y.o with 8/10 LLQ/left flank abdominal pain (radiating to back, aggravated by water).  Abdominal pain started 1/28 Tuesday evening after vigorous physical therapy Sunday and Monday.  Presented to [facility] 1/28 with CT proven 5 cm AAA with large 10 x 10 x 20 cm left hematoma near iliopsoas muscle.  Concern for spontaneous retroperitoneal bleed.  Hemodynamically stable with chronic atrial fibrillation (currently in AF) without anticoagulation.  CCM consulted for medical management.   PAST MEDICAL HISTORY :  Past Medical History  Diagnosis Date  . Mitral valve prolapse 2003    MVR in 2003  . Obesity   . Osteoarthritis   . Hypertension   . Hyperlipidemia   . COPD (chronic obstructive pulmonary disease)   . CHF (congestive heart failure) diastolic 10/2010    CXR in 12/2010: Prior CABG, cardiomegaly, vascular redistribution, bibasilar atelectasis, small effusions  . Pneumonia   . Impaired glucose tolerance   . Atrial fibrillation, chronic   . Prostate cancer     elevated PSA  . GERD (gastroesophageal reflux disease)     + hiatal hernia  . Arteriosclerotic cardiovascular disease (ASCVD) 1994    stent to RCA; CABG-2003  . Hard of hearing   . Anemia, iron deficiency   . AAA (abdominal aortic aneurysm)     Fusiform; infrarenal; 4-4.1cm on  CT in 07/2008 and 12/2010 by MRI  . Gastric ulcer 2004    2004; upper GI bleed  . Renal cysts, acquired, bilateral     Complex by MRI in 12/2010  . Adenomatous polyps 04/07/2012  . HYPERTENSION 12/17/2007    Subsequently hypotensive and all antihypertensive medication discontinued Lab  03/2012: Mild anemia with H&H-11.8/37.5, MCV-97, ferritin-224, normal CMet ex G-124, alb-3.4   . NSTEMI (non-ST elevated myocardial infarction) 01/18/2011  . Diabetes mellitus, type II 07/13/12    family denies patient is diabetic   Past Surgical History  Procedure Date  . Knee surgery     Left  . Bladder surgery   . Femoral artery stent 12-06-10    Left SFA  . Appendectomy   . Mitral valve replacement (mvr)/coronary artery bypass grafting (cabg) 2003    stent to RCA in 1994  . Eye surgery 2007    bilateral cataracts  . Tonsillectomy     thinks they were removed while in the [employer]  . Mass excision 06/07/2011    Procedure: EXCISION MASS;  Surgeon: Fabio Bering;  Location: [facility];  Service: General;  Laterality: N/A;  excision of 2 masses back and buttocks  . Back surgery   . Esophagogastroduodenoscopy 03/03/2003    Large, deep prepyloric ulcer, as described above without bleeding/ Normal esophagus  . Esophagogastroduodenoscopy 09/03/2004    normal throughout  . Colonoscopy 09/03/2004    small ulcer, without stigmata of bleeding  . Esophagogastroduodenoscopy 11/14/2004  Normal esophagus and small hiatal hernia, otherwise normal stomach   . Colonoscopy 11/14/2004    Internal hemorrhoids, otherwise normal rectum/ left-sided diverticula , diffusely oozing right colon mucosa without a discrete lesion amenable to endoscopic therapy. 2 diminutive polyps. FELT TO HAVE AVMs/telangiectasias  . Colonoscopy 01/29/2005    Normal rectum  . Colonoscopy 03/11/2012    Colonic diverticulosis. Colonic polyps-removed as described above. Vascular anomalies in the cecum likely representing hemangiomas. Status post  hemostasis clipping of  2 of the 3. ADENOMATOUS POLYPS. Repeat 2016  . Esophagogastroduodenoscopy 03/11/2012    Deformity of the antrum;  small polyp in antrum-not manipulated. Otherwise normal exam   Prior to Admission medications   Medication Sig Start Date End Date Taking? Authorizing Provider  albuterol (PROVENTIL) (2.5 MG/3ML) 0.083% nebulizer solution Take 3 mLs (2.5 mg total) by nebulization every 6 (six) hours as needed. Mixes with the Ipratropium(for shortness of breath) 07/02/12   Kerri Perches, MD  diltiazem (CARDIZEM) 120 MG tablet Take 1 tablet (120 mg total) by mouth 4 (four) times daily. 07/15/12   Christiane Ha, MD  Fluticasone-Salmeterol (ADVAIR) 100-50 MCG/DOSE AEPB Inhale 1 puff into the lungs every 12 (twelve) hours.    Historical Provider, MD  furosemide (LASIX) 20 MG tablet Take 60 mg by mouth daily.    Historical Provider, MD  ipratropium (ATROVENT) 0.02 % nebulizer solution Take 500 mcg by nebulization every 6 (six) hours as needed. Shortness of breath    Historical Provider, MD  levofloxacin (LEVAQUIN) 750 MG tablet Take 1 tablet (750 mg total) by mouth daily. 07/15/12   Christiane Ha, MD  lovastatin (MEVACOR) 40 MG tablet Take 40 mg by mouth at bedtime. Only takes on Sun, Mon, Wed, and Fri.    Historical Provider, MD  omeprazole (PRILOSEC) 40 MG capsule Take 40 mg by mouth daily.    Historical Provider, MD  oxymetazoline (AFRIN) 0.05 % nasal spray Place 1 spray into the nose daily as needed. Congestion    Historical Provider, MD  potassium chloride SA (K-DUR,KLOR-CON) 20 MEQ tablet Take 20 mEq by mouth 2 (two) times daily.    Historical Provider, MD  predniSONE (DELTASONE) 10 MG tablet 4 tablets daily for 2 days then decrease by one tablet daily every 2 days until off 07/15/12   Christiane Ha, MD  Tamsulosin HCl (FLOMAX) 0.4 MG CAPS Take 1 capsule (0.4 mg total) by mouth daily. 07/15/12   Christiane Ha, MD   Allergies  Allergen Reactions  . Gabapentin      Patient cannot take over 200 mg dose at a time as it causes jerking or spasms   FAMILY HISTORY:  Family History  Problem Relation Age of Onset  . Stroke Mother   . Stroke Brother   . Heart attack Brother   . Cancer Brother   . Cancer Brother   . Anesthesia problems Neg Hx   . Hypotension Neg Hx   . Malignant hyperthermia Neg Hx   . Pseudochol deficiency Neg Hx    SOCIAL HISTORY: Has 3/4 children living. 1 son deceased at age 16 due to drunk driving. Widow since 1966, lives with a daughter [name]) who is responsible for his care Quit smoking in 2010, no alchol for over 10 years though he "loves it" Retired from [employer], after 10 years in 1951. 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  REVIEW OF SYSTEMS:   General: normal appetite CV: denies chest pain,  denies palpitations  Lungs: denies sob GI/GU: denies nausea/vomiting, +8/10 abdominal pain LLQ/flank radiates to back, denies blood in urine/stool MSK: resolved left shoulder pain, +leg cramps Extremities: +chronic ankle edema   SUBJECTIVE: 8/10 LLQ/left flank abdominal pain radiates to back  VITAL SIGNS: Temp:  [97.9 F (36.6 C)] 97.9 F (36.6 C) (01/28 1939) Pulse Rate:  [50-96] 50  (01/28 2230) Resp:  [12-17] 17  (01/28 2000) BP: (97-118)/(50-67) 118/67 mmHg (01/28 2230) SpO2:  [92 %-95 %] 95 % (01/28 2230) HEMODYNAMICS:   VENTILATOR SETTINGS:   INTAKE / OUTPUT: Intake/Output    None     PHYSICAL EXAMINATION: VS 118, 32, 94-95%, 119/45 (62) General:  Lying in bed alert and oriented to person, location, year Neuro:  Alert and oriented x 3, moving all 4 extremities  HEENT:  [place]/at, perrl b/l Cardiovascular:  Atrial fibrillation, +systolic murmur  Lungs:  ctab Abdomen:  Soft, ttp RUQ, LUQ, LLQ. More ttp periumbilically, LLQ, left flank, suprapubic that radiates to left back  Musculoskeletal:  1+edema ankles  Skin:  Ecchymosis to right forearm, multiple tattoos   LABS:  Lab  07/21/12 2107 07/21/12 2017 07/21/12 1937  HGB -- -- 11.7*  WBC -- -- 27.2*  PLT -- -- 238  NA -- -- 130*  K -- -- 5.0  CL -- -- 89*  CO2 -- -- 31  GLUCOSE -- -- 213*  BUN -- -- 32*  CREATININE -- -- 1.25  CALCIUM -- -- 8.5  MG -- -- --  PHOS -- -- --  AST 23 -- --  ALT 36 -- --  ALKPHOS 58 -- --  BILITOT 1.0 -- --  PROT 6.6 -- --  ALBUMIN 3.6 -- --  APTT -- -- --  INR 1.08 -- --  LATICACIDVEN -- 1.4 --  TROPONINI -- -- <0.30  PROCALCITON -- -- --  PROBNP -- -- --  O2SATVEN -- -- --  PHART -- -- --  PCO2ART -- -- --  PO2ART -- -- --    Lab 07/22/12 0053 07/15/12 1106 07/15/12 0850 07/15/12 0741  GLUCAP 163* 156* 204* 145*   CXR:  1/28 Cardiomegaly. Mild vascular congestion.    ASSESSMENT / PLAN:  PULMONARY A:  Small left pleural effusion. Chronic COPD on home O2 3 L continuous , without exacerbation.  P:   Maintain nasal cannula 3L. O2>92%  ABG Bronchodilators / Advair  CARDIOVASCULAR 07/2011 echo EF 55-60% mild to moderate hypokinesis of the anteroseptal myocardium  A: Cardiomegaly. Hypertension. CAD s/p stenting. Status post mitral valve replacement. 5 cm AAA with large 10 x 10 x 20 cm left hematoma near iliopsoas muscle. Chronic atrial fibrillation not on chronic anticoagulation  P:  CTS primary (Dr. [name] Gianotti) will monitor overnight  Maintain SBP < 120 and DBP < 80 and HR < 120 Trend cardiac enzymes  Continue Cardizem if hemodynamically stable  RENAL A:  Hyponatremia P:   Trend BMET  No foley currently   GASTROINTESTINAL A:  Cirrhosis. Gallbladder sludge vs cholelithiasis noted CT . P:   NPO  HEMATOLOGIC A:  Leukocytosis. Normocytic anemia. 5 cm AAA with large 10 x 10 x 20 cm left hematoma. Concern for spontaneous retroperitoneal bleed near iliopsoas muscle P: scds DVT prophylaxis  Trend cbc  INFECTIOUS A:  No active issues  P:   No intervention   ENDOCRINE A:  Hyperglycemia.  Possible adrenal insufficiency due to chronic  steroids. P:   Monitor cbg, SSI Stress dose steroids if surgery or hemodynamically unstable  NEUROLOGIC  A:  Alert and oriented x 3 P:   Morphine prn pain   TODAY'S SUMMARY:  79 y.o multiple chronic medical conditions with abdominal pain with CT proven 5 cm AAA with large 10 x 10 x 20 cm left hematoma near iliopsoas muscle.  Concern for spontaneous retroperitoneal bleed.  HD stable with chronic atrial fibrillation without anticoagulation.  CCM consulted for medical management. CTS primary currently monitoring overnight with strict blood pressure control.  He is full code.    Desma Maxim MD [phone] PGY 1 IM  I have personally obtained a history, examined the patient, evaluated laboratory and imaging results, formulated the assessment and plan and placed orders.  Lonia Farber, MD Pulmonary and Critical Care Medicine [facility] Pager: [phone]  07/22/2012, 1:59 AM

## 2012-07-22 NOTE — Progress Notes (Signed)
Patient's abdomen appears more distended than it did upon arrival.  Tender to touch.  Abdominal girth measured (marked) above the umbilicus at 129cm.  BP 100's/50's afib 115 sO2 96% on 4L.  Dr. [name] Gianotti notified and stat CT of abdomen/pelvis ordered.

## 2012-07-22 NOTE — Progress Notes (Signed)
Patient's daughter [name] with patient, updated on pt's condition and plan of care.  Pt's clothing given to daughter.  Daughter instructed to bring HCPOA documentation to hospital.

## 2012-07-22 NOTE — Progress Notes (Signed)
Patient voided 150 amber, clear urine + unmeasurable amount on bed pad.  Bladder scan performed. No urine detected in bladder.  Will continue to offer toileting regularly and monitor.

## 2012-07-23 ENCOUNTER — Inpatient Hospital Stay (HOSPITAL_COMMUNITY): Payer: Medicare Other

## 2012-07-23 LAB — CBC
HCT: 27.5 % — ABNORMAL LOW (ref 39.0–52.0)
HCT: 27.1 % — ABNORMAL LOW (ref 39.0–52.0)
Hemoglobin: 9 g/dL — ABNORMAL LOW (ref 13.0–17.0)
Hemoglobin: 9 g/dL — ABNORMAL LOW (ref 13.0–17.0)
MCHC: 32.7 g/dL (ref 30.0–36.0)
MCHC: 33.2 g/dL (ref 30.0–36.0)
MCV: 92.8 fL (ref 78.0–100.0)
WBC: 25.2 10*3/uL — ABNORMAL HIGH (ref 4.0–10.5)

## 2012-07-23 LAB — GLUCOSE, CAPILLARY
Glucose-Capillary: 168 mg/dL — ABNORMAL HIGH (ref 70–99)
Glucose-Capillary: 105 mg/dL — ABNORMAL HIGH (ref 70–99)

## 2012-07-23 LAB — COMPREHENSIVE METABOLIC PANEL
ALT: 24 U/L (ref 0–53)
Albumin: 2.8 g/dL — ABNORMAL LOW (ref 3.5–5.2)
Alkaline Phosphatase: 45 U/L (ref 39–117)
Calcium: 8 mg/dL — ABNORMAL LOW (ref 8.4–10.5)
GFR calc Af Amer: 55 mL/min — ABNORMAL LOW (ref >90)
Glucose, Bld: 181 mg/dL — ABNORMAL HIGH (ref 70–99)
Potassium: 4.5 mEq/L (ref 3.5–5.1)
Sodium: 137 mEq/L (ref 135–145)
Total Protein: 5.4 g/dL — ABNORMAL LOW (ref 6.0–8.3)

## 2012-07-23 MED ORDER — ATORVASTATIN CALCIUM 10 MG PO TABS
10.0000 mg | ORAL_TABLET | Freq: Every day | ORAL | Status: DC
  Administered 2012-07-23 – 2012-07-27 (×5): 10 mg via ORAL
  Filled 2012-07-23 (×6): qty 1

## 2012-07-23 MED ORDER — DILTIAZEM HCL 60 MG PO TABS
60.0000 mg | ORAL_TABLET | Freq: Four times a day (QID) | ORAL | Status: DC
  Administered 2012-07-23 – 2012-07-27 (×16): 60 mg via ORAL
  Filled 2012-07-23 (×24): qty 1

## 2012-07-23 MED ORDER — TAMSULOSIN HCL 0.4 MG PO CAPS
0.4000 mg | ORAL_CAPSULE | Freq: Every day | ORAL | Status: DC
  Administered 2012-07-23 – 2012-07-28 (×6): 0.4 mg via ORAL
  Filled 2012-07-23 (×7): qty 1

## 2012-07-23 MED ORDER — PANTOPRAZOLE SODIUM 40 MG PO TBEC
40.0000 mg | DELAYED_RELEASE_TABLET | Freq: Every day | ORAL | Status: DC
  Administered 2012-07-23 – 2012-07-28 (×6): 40 mg via ORAL
  Filled 2012-07-23 (×6): qty 1

## 2012-07-23 MED ORDER — GUAIFENESIN ER 600 MG PO TB12
600.0000 mg | ORAL_TABLET | Freq: Two times a day (BID) | ORAL | Status: DC
  Administered 2012-07-23 – 2012-07-28 (×11): 600 mg via ORAL
  Filled 2012-07-23 (×12): qty 1

## 2012-07-23 MED ORDER — SIMVASTATIN 20 MG PO TABS
20.0000 mg | ORAL_TABLET | Freq: Every day | ORAL | Status: DC
  Filled 2012-07-23: qty 1

## 2012-07-23 NOTE — Progress Notes (Signed)
[facility]  Patient Status: Active (receiving services up to time of hospitalization)  [facility] is providing the following services: RN and PT  If patient discharges after hours, please call [phone].   Jodene Nam 07/23/2012, 12:55 PM

## 2012-07-23 NOTE — Progress Notes (Signed)
PULMONARY  / CRITICAL CARE MEDICINE  Name: [name] MRN: [id number] DOB: 26-Jun-1921    REFERRING MD :  Coral Else  CHIEF COMPLAINT:  Flank pain.  BRIEF PATIENT DESCRIPTION:  79 yo male former smoker admitted on 07/21/2012 with flank pain from retroperitoneal hematoma.  PCCM consulted 1/29 for medical management while in ICU.  He was in hospital 1/19 to 1/22 for AECOPD.  Significant PMHx of COPD, Chronic hypoxic respiratory failure (3L oxygen 24/7),  HTN, Hyperlipidemia, CAD s/p CABG, MVP s/p MVR, A fib, CHF, Prostate cancer, DM, AAA, Thoracic aortic aneurysm, PUD   SIGNIFICANT EVENTS: 1/28 Transfer from [facility] to [facility]  STUDIES:  CT chest 1/28 >> 5 cm ascending thoracic aortic aneurysm no change, small Lt effusion with ATX CT abd/pelvis 1/28 >> 10x10x20 cm Lt retroperitoneal hematoma of iliopsoas muscle, 5 cm infrarenal AAA, changes of cirrhosis, gallbladder sludge CT abd/pelvis 1/29 >> trace Lt effusion, basilar ATX, splenic granuloma, b/l renal cysts, colonic diverticulosis, 12x10.7x21.6 cm Lt retroperitoneal hematoma, bladder wall thickening  SUBJECTIVE:  Occasional cough with chest congestion.  Denies chest pain.  Breathing okay.  Abdominal pain better.  VITAL SIGNS: Temp:  [97.2 F (36.2 C)-98.4 F (36.9 C)] 97.2 F (36.2 C) (01/30 0340) Pulse Rate:  [54-103] 95  (01/30 0700) Resp:  [17-26] 22  (01/30 0700) BP: (90-119)/(44-83) 103/47 mmHg (01/30 0700) SpO2:  [95 %-99 %] 99 % (01/30 0700) Weight:  [234 lb 9.1 oz (106.4 kg)] 234 lb 9.1 oz (106.4 kg) (01/30 0500)  PHYSICAL EXAMINATION: General:  No distress Neuro:  Alert, normal strength, follows commands HEENT:  No sinus tenderness Cardiovascular:  s1s2 irregular Lungs:  Decreased breath sounds, no wheeze Abdomen:  Soft, decreased flank pain Musculoskeletal:  No edema Skin:  No rashes   Lab 07/23/12 0200 07/22/12 0158 07/21/12 1937  NA 137 135 130*  K 4.5 5.2* 5.0  CL 100 95* 89*  CO2 30 31 31   BUN 37*  33* 32*  CREATININE 1.29 1.29 1.25  GLUCOSE 181* 163* 213*    Lab 07/23/12 0200 07/22/12 1729 07/22/12 0918  HGB 9.0* 8.9* 9.6*  HCT 27.5* 26.8* 28.8*  WBC 25.2* 28.0* 27.7*  PLT 171 200 211    Lab Results  Component Value Date   ALT 24 07/23/2012   AST 23 07/23/2012   ALKPHOS 45 07/23/2012   BILITOT 1.0 07/23/2012    Ct Abdomen Pelvis Wo Contrast  07/22/2012  *RADIOLOGY REPORT*  Clinical Data: Retroperitoneal bleed  CT ABDOMEN AND PELVIS WITHOUT CONTRAST  Technique:  Multidetector CT imaging of the abdomen and pelvis was performed following the standard protocol without intravenous contrast.  Comparison: 07/13/2012  Findings: Cardiomegaly.  Mitral valve replacement.  Trace left pleural effusion.  Bibasilar opacities, likely atelectasis.  Organ abnormality/lesion detection is limited in the absence of intravenous contrast. Within this limitation, lobular liver contour.  Gallstones.  Splenic granuloma.  Unremarkable pancreas and adrenal glands.  Bilateral retained renal contrast with delayed excretion. Bilateral renal cystic lesions, some of which are cysts and some which are incompletely characterized.  No hydronephrosis or hydroureter  Colonic diverticulosis. Decompressed small bowel loops. Surgical clip right lower quadrant may reflect appendectomy.  Retroperitoneal hematoma centered on the left, measuring up to 12 x 10.7 cm in axial dimensions (series 2 image 47) and 21.6 cm craniocaudal on coronal image 62, similar to slightly larger since the prior allowing for differences in slice selection.  Again, the hematoma is noted to extend anterior to the aorta, with small  amount collecting along the small bowel mesenteric root and right retroperitoneum. The quantity collecting on the right has increased in the interval though remains ill defined/difficult to accurately measure.  Superiorly, hematoma abuts the posterior margin of the spleen.  Inferiorly, the hematoma extends along the left greater than  right iliac vasculature. There is a preserved fat plane separating the majority of the hematoma from the aorta, with the exception of the component crossing midline, which is inseparable from the anterior margin of the aorta.  Scattered atherosclerosis of the aorta and branch vessels. Infrarenal aneurysm measures up to 5 cm in diameter, unchanged.  Contrast collects within the bladder.  Multiple bladder diverticula.  Circumferential wall thickening.  TURP defect. Lobular prostate gland.  Osteopenia and multilevel degenerative changes without acute osseous finding.  IMPRESSION: Large retroperitoneal hematoma centered on the left. The dominant component is similar to minimally larger in size. There is a small amount of blood crossing midline and tracking along the right retroperitoneum, increased in the interval.  Unchanged differential.  Discussed via telephone with Dr. [name] Gianotti at 05:00 a.m. on 07/22/2012.  Retained contrast within the kidneys bilaterally.  Correlate with serial creatinine for contrast induced nephropathy.  Circumferential bladder wall thickening with multiple diverticula suggest chronic partial outlet obstruction.   Original Report Authenticated By: Jearld Lesch, M.D.    Ct Angio Chest Pe W/cm &/or Wo Cm  07/21/2012  *RADIOLOGY REPORT*  Clinical Data:  79 year old male with chest, abdominal and pelvic pain.  History of mitral valve replacement and GI bleed.  The patient is not currently on anticoagulation.  CT ANGIOGRAPHY CHEST, ABDOMEN AND PELVIS  Technique:  Multidetector CT imaging through the chest, abdomen and pelvis was performed using the standard protocol during bolus administration of intravenous contrast.  Multiplanar reconstructed images including MIPs were obtained and reviewed to evaluate the vascular anatomy.  Contrast: OMNIPAQUE IOHEXOL 350 MG/ML SOLN  Comparison:  07/09/2011  CTA CHEST  Findings:  Cardiomegaly and mitral valve replacement noted. Moderate coronary artery  calcifications are present. And ascending thoracic aortic aneurysm is noted and unchanged measuring 5 cm in greatest diameter.  There is no evidence of dissection.  A small left pleural effusion is noted. There is no evidence of right pleural effusion or pericardial effusion. No enlarged lymph nodes are identified.  Bibasilar atelectasis is noted, left greater than right. There is no evidence of airspace disease, consolidation, mass or endobronchial/endotracheal lesions.   Review of the MIP images confirms the above findings.  IMPRESSION: Unchanged cardiomegaly and ascending thoracic aortic aneurysm measuring 5 cm.  No evidence of dissection or rupture.  Small left pleural effusion with mild bibasilar atelectasis, left greater than right.  CTA ABDOMEN AND PELVIS  Findings:  A large left retroperitoneal hematoma involving the iliopsoas muscle is noted measuring 10 x 10 x 20 cm.  This hematoma lies extremely close to the unchanged 5 cm infrarenal suprailiac abdominal aortic aneurysm, but no definite connection, contrast extravasation, or irregularity of the aortic aneurysm wall is identified.  There is diffuse enlargement of the left iliopsoas musculature rather than displacement. This hematoma is favored to represent a spontaneous retroperitoneal hematoma, but given history of no anticoagulation, an abdominal aortic rupture without evidence of active arterial extravasation is not entirely excluded.  Cirrhosis is identified. Gallbladder sludge versus stones noted. A 2.2 x 2.5 cm anterior slightly hyperdense splenic lesion is nonspecific but statistically benign. Renal atrophy and bilateral renal cysts are present. The pancreas is unremarkable.  There is no  evidence of biliary dilatation, enlarged lymph nodes, or free fluid. The bladder and bowel are within normal limits. No acute or suspicious bony abnormalities are identified. Degenerative changes throughout the spine are present.   Review of the MIP images confirms  the above findings.  IMPRESSION: 10 x 10 x 20 cm left retroperitoneal hematoma adjacent to the a 5 cm infrarenal suprailiac abdominal aortic aneurysm.  Given the characteristics discussed above, a spontaneous retroperitoneal hematoma is favored.  A small focal abdominal aortic aneurysm rupture without contrast extravasation, wall irregularity or identifiable connection is a possibility given the fact that this patient is not on anticoagulation.  Cirrhosis.  Gallbladder sludge versus cholelithiasis.  These results were called to Dr. Estell Harpin on 07/21/2012 at 9:15 a.m.   Original Report Authenticated By: Harmon Pier, M.D.    Dg Chest Port 1 View  07/22/2012  *RADIOLOGY REPORT*  Clinical Data: Left pleural effusion.  PORTABLE CHEST - 1 VIEW  Comparison: 01/28 and 07/13/2012  Findings: There is chronic cardiomegaly.  Pulmonary vascularity is now normal.  Right lung is clear.  Increased density at the left lung base consistent with a combination of pleural effusion and atelectasis.  IMPRESSION:  Increased atelectasis/small effusion at the left lung base. Pulmonary vascularity is now normal.   Original Report Authenticated By: Francene Boyers, M.D.    Dg Chest Portable 1 View  07/21/2012  *RADIOLOGY REPORT*  Clinical Data: Chest pain.  PORTABLE CHEST - 1 VIEW  Comparison: 07/13/2012.  Findings: Cardiomegaly.  Slight improved aeration.  Elevated left hemidiaphragm.  Some clearing of edema and atelectasis.  Calcified tortuous aorta.  Prior median sternotomy.  Mild vascular congestion.  IMPRESSION: Cardiomegaly.  Mild vascular congestion.  Slight improvement aeration from previous radiograph.   Original Report Authenticated By: Davonna Belling, M.D.    Ct Angio Abd/pel W/ And/or W/o  07/21/2012  *RADIOLOGY REPORT*  Clinical Data:  79 year old male with chest, abdominal and pelvic pain.  History of mitral valve replacement and GI bleed.  The patient is not currently on anticoagulation.  CT ANGIOGRAPHY CHEST, ABDOMEN AND  PELVIS  Technique:  Multidetector CT imaging through the chest, abdomen and pelvis was performed using the standard protocol during bolus administration of intravenous contrast.  Multiplanar reconstructed images including MIPs were obtained and reviewed to evaluate the vascular anatomy.  Contrast: OMNIPAQUE IOHEXOL 350 MG/ML SOLN  Comparison:  07/09/2011  CTA CHEST  Findings:  Cardiomegaly and mitral valve replacement noted. Moderate coronary artery calcifications are present. And ascending thoracic aortic aneurysm is noted and unchanged measuring 5 cm in greatest diameter.  There is no evidence of dissection.  A small left pleural effusion is noted. There is no evidence of right pleural effusion or pericardial effusion. No enlarged lymph nodes are identified.  Bibasilar atelectasis is noted, left greater than right. There is no evidence of airspace disease, consolidation, mass or endobronchial/endotracheal lesions.   Review of the MIP images confirms the above findings.  IMPRESSION: Unchanged cardiomegaly and ascending thoracic aortic aneurysm measuring 5 cm.  No evidence of dissection or rupture.  Small left pleural effusion with mild bibasilar atelectasis, left greater than right.  CTA ABDOMEN AND PELVIS  Findings:  A large left retroperitoneal hematoma involving the iliopsoas muscle is noted measuring 10 x 10 x 20 cm.  This hematoma lies extremely close to the unchanged 5 cm infrarenal suprailiac abdominal aortic aneurysm, but no definite connection, contrast extravasation, or irregularity of the aortic aneurysm wall is identified.  There is diffuse enlargement of  the left iliopsoas musculature rather than displacement. This hematoma is favored to represent a spontaneous retroperitoneal hematoma, but given history of no anticoagulation, an abdominal aortic rupture without evidence of active arterial extravasation is not entirely excluded.  Cirrhosis is identified. Gallbladder sludge versus stones noted. A  2.2 x 2.5 cm anterior slightly hyperdense splenic lesion is nonspecific but statistically benign. Renal atrophy and bilateral renal cysts are present. The pancreas is unremarkable.  There is no evidence of biliary dilatation, enlarged lymph nodes, or free fluid. The bladder and bowel are within normal limits. No acute or suspicious bony abnormalities are identified. Degenerative changes throughout the spine are present.   Review of the MIP images confirms the above findings.  IMPRESSION: 10 x 10 x 20 cm left retroperitoneal hematoma adjacent to the a 5 cm infrarenal suprailiac abdominal aortic aneurysm.  Given the characteristics discussed above, a spontaneous retroperitoneal hematoma is favored.  A small focal abdominal aortic aneurysm rupture without contrast extravasation, wall irregularity or identifiable connection is a possibility given the fact that this patient is not on anticoagulation.  Cirrhosis.  Gallbladder sludge versus cholelithiasis.  These results were called to Dr. Estell Harpin on 07/21/2012 at 9:15 a.m.   Original Report Authenticated By: Harmon Pier, M.D.    CBG (last 3)   Basename 07/22/12 2218 07/22/12 1626 07/22/12 1203  GLUCAP 144* 147* 151*     ASSESSMENT / PLAN:  Lt flank pain 2nd to retroperitoneal hematoma. Hx of AAA. Plan: Continue to monitor in ICU  COPD with recent exacerbation. Completed Abx and prednisone 1/24. Chronic hypoxic respiratory failure. Small Lt pleural effusion. Plan: Continue dulera PRN albuterol/ipratropium F/u CXR intermittently Bronchial hygiene Adjust oxygen to keep SpO2 > 92%  Hx of CAD, HTN, A fib, MVR, hyperlipidemia, chronic diastolic CHF. Not coumadin candidate.  Followed by [facility] cardiology as outpt. Plan: D/c IV lopressor Resume cardizem 60 mg qid and increase up as tolerated, lovastatin 40 qd >> use simvastatin while in hospital Hold lasix No ASA due to bleeding  DM type II with steroid induced  hyperglycemia. Plan: SSI  Anemia. Likely from chronic disease, hx of iron deficiency, and retroperitoneal hematoma.  Followed by hematology as outpt [name]) Plan: CBC q12h SCD for DVT prophylaxis  Nutrition. Plan: Resume diet 1/30 Continue IV fluid  Hx of prostate cancer. Oliguria. Plan: Resume flomax 0.4 mg qd  Hx of PUD. Plan: Protonix  Changes of cirrhosis noted on CT abd 1/28. Plan: Monitor LFT's intermittently  Resolved problems >> Hyponatremia, hypotension, hyperkalemia  Coralyn Helling, MD [facility] Pulmonary/Critical Care 07/23/2012, 8:00 AM Pager:  [phone] After 3pm call: [phone]

## 2012-07-23 NOTE — Progress Notes (Addendum)
[facility] [place]     History of Present Illness  [name] is a 79 y.o. male who has known 5 cm AAA.  Pt is doing well today. states "belly" pain much better; denies nausea/vomiting; denies diarrhea. has not had flatus;has not had BM. He is hungry and thirsty.   Significant Diagnostic Studies: CBC Lab Results  Component Value Date   WBC 25.2* 07/23/2012   HGB 9.0* 07/23/2012   HCT 27.5* 07/23/2012   MCV 93.5 07/23/2012   PLT 171 07/23/2012    BMET    Component Value Date/Time   NA 137 07/23/2012 0200   K 4.5 07/23/2012 0200   CL 100 07/23/2012 0200   CO2 30 07/23/2012 0200   GLUCOSE 181* 07/23/2012 0200   BUN 37* 07/23/2012 0200   CREATININE 1.29 07/23/2012 0200   CREATININE 1.05 05/08/2012 1052   CALCIUM 8.0* 07/23/2012 0200   GFRNONAA 48* 07/23/2012 0200   GFRAA 55* 07/23/2012 0200    COAG Lab Results  Component Value Date   INR 1.28 07/22/2012   INR 1.08 07/21/2012   INR 1.27 03/21/2012   No results found for this basename: PTT    I/O last 3 completed shifts: In: 2841.3 [P.O.:360; I.V.:2121.3; Blood:350; IV Piggyback:10] Out: 1375 [Urine:1375]    Physical Examination BP Readings from Last 3 Encounters:  07/23/12 103/47  07/15/12 134/77  07/10/12 116/73   Temp Readings from Last 3 Encounters:  07/23/12 97.2 F (36.2 C) Oral  07/15/12 97.3 F (36.3 C) Oral  05/27/12 97.9 F (36.6 C) Oral   SpO2 Readings from Last 3 Encounters:  07/23/12 99%  07/15/12 91%  05/13/12 89%   Pulse Readings from Last 3 Encounters:  07/23/12 95  07/15/12 80  07/10/12 72    General: A&O x 3, WDWN male in NAD Pulmonary: normal non-labored breathing , without Rales, rhonchi,  wheezing Cardiac: Heart rate : irregular ,  Abdomen:abdomen soft and has some BS Neurologic: A&O X 3; Appropriate Affect ; SENSATION: normal; MOTOR FUNCTION:  moving all extremities equally. Speech is fluent/normal  Vascular Exam:BLE feet cool and well perfused Extremities  without ischemic changes, no Gangrene, no cellulitis; no open wounds;   Assessment/Plan: [name] is a 79 y.o. male who has known AAA with retroperitoneal bleed Pt hemodynamically stable He has less abdominal pain Acute blood loss anemia - will transfuse again Will keep in unit today and at bedrest BUN 37 - will continue hydration Begin diet   Mar[place]e [place] [phone] 07/23/2012 7:32 AM  Agree with above Patient feeling much better today, abdominal pain is less.  He wants to eat. Received 1 unit of blood yesterday, HCT is slightly down today. He remains HD stable  Based on his clinical condition as well as extensive review of his 2 CT scans with radiology, I feel that this is a spontaneous retroperitoneal hematoma with the bleeding seen in his left psoas muscle, likely from excessive PT.  I do not think the bleeding is from his AAA.  We will continue to treat him conservatively.  I will consider repairing his AAA electively, possibly next week.  Durene Cal

## 2012-07-23 NOTE — Telephone Encounter (Signed)
Noted patient current inpatient.

## 2012-07-24 ENCOUNTER — Encounter (HOSPITAL_COMMUNITY): Payer: Self-pay | Admitting: Radiology

## 2012-07-24 ENCOUNTER — Inpatient Hospital Stay (HOSPITAL_COMMUNITY): Payer: Medicare Other

## 2012-07-24 LAB — GLUCOSE, CAPILLARY
Glucose-Capillary: 137 mg/dL — ABNORMAL HIGH (ref 70–99)
Glucose-Capillary: 100 mg/dL — ABNORMAL HIGH (ref 70–99)
Glucose-Capillary: 119 mg/dL — ABNORMAL HIGH (ref 70–99)

## 2012-07-24 LAB — BASIC METABOLIC PANEL
CO2: 28 mEq/L (ref 19–32)
Chloride: 98 mEq/L (ref 96–112)
Glucose, Bld: 104 mg/dL — ABNORMAL HIGH (ref 70–99)
Sodium: 133 mEq/L — ABNORMAL LOW (ref 135–145)

## 2012-07-24 LAB — CBC
Hemoglobin: 8.6 g/dL — ABNORMAL LOW (ref 13.0–17.0)
MCV: 93.4 fL (ref 78.0–100.0)
Platelets: 143 10*3/uL — ABNORMAL LOW (ref 150–400)
Platelets: 142 10*3/uL — ABNORMAL LOW (ref 150–400)
RBC: 2.73 MIL/uL — ABNORMAL LOW (ref 4.22–5.81)
RBC: 2.77 MIL/uL — ABNORMAL LOW (ref 4.22–5.81)
WBC: 18 10*3/uL — ABNORMAL HIGH (ref 4.0–10.5)
WBC: 17 10*3/uL — ABNORMAL HIGH (ref 4.0–10.5)

## 2012-07-24 LAB — PROTIME-INR
INR: 1.16 (ref 0.00–1.49)
Prothrombin Time: 14.6 seconds (ref 11.6–15.2)

## 2012-07-24 MED ORDER — IOHEXOL 350 MG/ML SOLN
100.0000 mL | Freq: Once | INTRAVENOUS | Status: AC | PRN
  Administered 2012-07-24: 100 mL via INTRAVENOUS

## 2012-07-24 MED ORDER — FERROUS SULFATE 325 (65 FE) MG PO TABS
325.0000 mg | ORAL_TABLET | Freq: Every day | ORAL | Status: DC
  Administered 2012-07-24 – 2012-07-28 (×5): 325 mg via ORAL
  Filled 2012-07-24 (×7): qty 1

## 2012-07-24 NOTE — Progress Notes (Signed)
Patient is currently active with [facility] Care Management for chronic disease management services.  Patient has been engaged by a Airline pilot.  He received a transition of care call on 1.24.14 and was medically stable at that time.  Assistance was provided to follow up with a post discharge visit with his PCP, [name] MD.  Our community based plan of care has focused on disease management of COPD and medication adherence.  As noted patient is active with [facility] for RN PT services.  Will continue monitor patient disposition.  Patient will receive a post discharge transition of care call and will be evaluated for monthly home visits for assessments and disease process education.  Made inpatient Case Manager, Avie Arenas RN,  aware that [facility] Care Management is  following. Of note, [facility] Care Management services does not replace or interfere with any services that are arranged by inpatient case management or social work.  For additional questions or referrals please contact Anibal Henderson BSN RN [facility] Liaison at [phone].

## 2012-07-24 NOTE — Progress Notes (Signed)
PULMONARY  / CRITICAL CARE MEDICINE  Name: [name] MRN: [id number] DOB: 11/13/1921    REFERRING MD :  Coral Else  CHIEF COMPLAINT:  Flank pain.  BRIEF PATIENT DESCRIPTION:  79 yo male former smoker admitted on 07/21/2012 with flank pain from retroperitoneal hematoma.  PCCM consulted 1/29 for medical management while in ICU.  He was in hospital 1/19 to 1/22 for AECOPD.  Significant PMHx of COPD, Chronic hypoxic respiratory failure (3L oxygen 24/7),  HTN, Hyperlipidemia, CAD s/p CABG, MVP s/p MVR, A fib, CHF, Prostate cancer, DM, AAA, Thoracic aortic aneurysm, PUD   SIGNIFICANT EVENTS: 1/28 Transfer from [facility] to [facility]  STUDIES:  CT chest 1/28 >> 5 cm ascending thoracic aortic aneurysm no change, small Lt effusion with ATX CT abd/pelvis 1/28 >> 10x10x20 cm Lt retroperitoneal hematoma of iliopsoas muscle, 5 cm infrarenal AAA, changes of cirrhosis, gallbladder sludge CT abd/pelvis 1/29 >> trace Lt effusion, basilar ATX, splenic granuloma, b/l renal cysts, colonic diverticulosis, 12x10.7x21.6 cm Lt retroperitoneal hematoma, bladder wall thickening  SUBJECTIVE:  Had trouble sleeping last night.  Denies chest pain, dyspnea.  VITAL SIGNS: Temp:  [96.6 F (35.9 C)-98.4 F (36.9 C)] 97.6 F (36.4 C) (01/31 0400) Pulse Rate:  [54-102] 81  (01/31 0800) Resp:  [17-33] 22  (01/31 0800) BP: (90-120)/(41-74) 104/53 mmHg (01/31 0800) SpO2:  [91 %-98 %] 93 % (01/31 0843)  PHYSICAL EXAMINATION: General:  No distress Neuro:  Alert, normal strength, follows commands HEENT:  No sinus tenderness Cardiovascular:  s1s2 irregular Lungs:  Decreased breath sounds, no wheeze Abdomen:  Soft, decreased flank pain Musculoskeletal:  No edema Skin:  No rashes   Lab 07/24/12 0430 07/23/12 0200 07/22/12 0158  NA 133* 137 135  K 4.0 4.5 5.2*  CL 98 100 95*  CO2 28 30 31   BUN 23 37* 33*  CREATININE 0.93 1.29 1.29  GLUCOSE 104* 181* 163*    Lab 07/24/12 0430 07/23/12 1753 07/23/12 0200  HGB  8.6* 9.0* 9.0*  HCT 25.5* 27.1* 27.5*  WBC 18.0* 20.1* 25.2*  PLT 143* 142* 171    Lab Results  Component Value Date   ALT 24 07/23/2012   AST 23 07/23/2012   ALKPHOS 45 07/23/2012   BILITOT 1.0 07/23/2012    Dg Chest Port 1 View  07/23/2012  *RADIOLOGY REPORT*  Clinical Data: Follow up pleural effusion and atelectasis  PORTABLE CHEST - 1 VIEW  Comparison: 07/22/2012  Findings: The patient is status post median sternotomy and valve replacement.  Cardiomegaly is again identified and appears unchanged in degree.  Aortic ectasia with aortic calcification is also unchanged.  The right costophrenic angle has been excluded from the film making evaluation for small amount of right pleural fluid suboptimal.  No gross pleural effusion is seen on the right.  Persistent density at the left lung base is noted and likely represents atelectasis combined with a small amount of pleural fluid.  The possibility of an underlying infiltrate is not excluded.  Pulmonary vascular congestion without signs of overt congestive failure are noted.  IMPRESSION: Excluded right costophrenic angle.  Stable cardiomegaly, mild pulmonary vascular congestion and left lower lobe atelectasis/effusion.   Original Report Authenticated By: Rhodia Albright, M.D.    CBG (last 3)   Basename 07/24/12 0755 07/23/12 2156 07/23/12 1140  GLUCAP 100* 105* 168*     ASSESSMENT / PLAN:  Lt flank pain 2nd to retroperitoneal hematoma. Hx of AAA. Plan: Continue to monitor in ICU until okay for transfer per [facility]  COPD with recent exacerbation. Completed Abx and prednisone 1/24. Chronic hypoxic respiratory failure. Small Lt pleural effusion. Plan: Continue dulera PRN albuterol/ipratropium F/u CXR intermittently Bronchial hygiene Adjust oxygen to keep SpO2 > 92%  Hx of CAD, HTN, A fib, MVR, hyperlipidemia, chronic diastolic CHF. Not coumadin candidate.  Followed by [facility] cardiology as outpt. Plan: Continue cardizem 60 mg qid and  increase up as tolerated,  Continue lovastatin 40 qd >> use simvastatin while in hospital Hold outpt lasix regimen for now No ASA due to bleeding  DM type II with steroid induced hyperglycemia. Plan: SSI  Anemia. Likely from chronic disease, hx of iron deficiency, and retroperitoneal hematoma.  Followed by hematology as outpt [name]) Plan: CBC daily Transfuse for Hb < 7 SCD for DVT prophylaxis Added FeSO4 1/31  Nutrition. Plan: Resumed diet 1/30 Decrease IV fluid to 50 ml/hr  Hx of prostate cancer. Oliguria. Improved 1/31. Plan: Resume flomax 0.4 mg qd  Hx of PUD. Plan: Protonix  Changes of cirrhosis noted on CT abd 1/28. Plan: Monitor LFT's intermittently  Resolved problems >> Hyponatremia, hypotension, hyperkalemia  Okay from medical standpoint to transfer out of ICU >> defer decision to [facility].  PCCM available as needed over weekend >> call if assistance needed.  If patient transfer out of ICU, then recommend consulting [facility] hospitalist team to assist with medical management.  Coralyn Helling, MD [facility] Pulmonary/Critical Care 07/24/2012, 9:28 AM Pager:  [phone] After 3pm call: [phone]

## 2012-07-24 NOTE — Progress Notes (Addendum)
[facility] [place] Progress Note  History of Present Illness  [name] is a 79 y.o. male who is  Stable with known AAA.  Pt is doing well. complains of LLQ and left flank pain; denies nausea/vomiting; eating well. HCT dropped again.  IMAGING: Dg Chest Port 1 View  07/23/2012  *RADIOLOGY REPORT*  Clinical Data: Follow up pleural effusion and atelectasis  PORTABLE CHEST - 1 VIEW  Comparison: 07/22/2012  Findings: The patient is status post median sternotomy and valve replacement.  Cardiomegaly is again identified and appears unchanged in degree.  Aortic ectasia with aortic calcification is also unchanged.  The right costophrenic angle has been excluded from the film making evaluation for small amount of right pleural fluid suboptimal.  No gross pleural effusion is seen on the right.  Persistent density at the left lung base is noted and likely represents atelectasis combined with a small amount of pleural fluid.  The possibility of an underlying infiltrate is not excluded.  Pulmonary vascular congestion without signs of overt congestive failure are noted.  IMPRESSION: Excluded right costophrenic angle.  Stable cardiomegaly, mild pulmonary vascular congestion and left lower lobe atelectasis/effusion.   Original Report Authenticated By: Rhodia Albright, M.D.     Significant Diagnostic Studies: CBC Lab Results  Component Value Date   WBC 18.0* 07/24/2012   HGB 8.6* 07/24/2012   HCT 25.5* 07/24/2012   MCV 93.4 07/24/2012   PLT 143* 07/24/2012    BMET    Component Value Date/Time   NA 133* 07/24/2012 0430   K 4.0 07/24/2012 0430   CL 98 07/24/2012 0430   CO2 28 07/24/2012 0430   GLUCOSE 104* 07/24/2012 0430   BUN 23 07/24/2012 0430   CREATININE 0.93 07/24/2012 0430   CREATININE 1.05 05/08/2012 1052   CALCIUM 7.8* 07/24/2012 0430   GFRNONAA 73* 07/24/2012 0430   GFRAA 84* 07/24/2012 0430    COAG Lab Results  Component Value Date   INR 1.28 07/22/2012   INR 1.08  07/21/2012   INR 1.27 03/21/2012   No results found for this basename: PTT    I/O last 3 completed shifts: In: 6110 [P.O.:2210; I.V.:3550; Blood:350] Out: 1846 [Urine:1845; Stool:1] Total I/O In: 200 [I.V.:200] Out: -   Physical Examination BP Readings from Last 3 Encounters:  07/24/12 114/58  07/15/12 134/77  07/10/12 116/73   Temp Readings from Last 3 Encounters:  07/24/12 97.6 F (36.4 C) Oral  07/15/12 97.3 F (36.3 C) Oral  05/27/12 97.9 F (36.6 C) Oral   SpO2 Readings from Last 3 Encounters:  07/24/12 92%  07/15/12 91%  05/13/12 89%   Pulse Readings from Last 3 Encounters:  07/24/12 93  07/15/12 80  07/10/12 72    General: A&O x 3, WDWN male in NAD Pulmonary: normal non-labored breathing , without Rales, rhonchi,  wheezing Cardiac: Heart rate : irregular , with frequent PVC  Abdomen:abdomen soft and positive findings: hypoactive bowel sounds and mild tenderness  in the LLQ and in the lower abdomen  Neurologic: A&O X 3; Appropriate Affect ; SENSATION: normal; MOTOR FUNCTION:  moving all extremities equally. Speech is fluent/normal  Vascular Exam:BLE warm and well perfused Extremities without ischemic changes, no Gangrene, no cellulitis; no open wounds;    Assessment/Plan: [name] is a 78 y.o. male who has known AAA and Large retroperitoneal hematoma centered on the left per CT. Acute blood loss anemia - HCT lower despite 1 UPC yesterday Will transfuse another UPC today Repeat CTA  per Dr. [name] Gianotti BUN now normalizing, CR stable ABD remains soft with LLQ and Flank pain - no increase in symptoms + BS , Taking PO well - NPO for now until CTA complete Repeat CBC, PT/INR now Afib- HR irreg 80's to 90's with mult PVC's K+ wnl  Marlowe Shores [phone] 07/24/2012 10:25 AM   I agree with the above.  The patient required one unit of blood yesterday. His hematocrit has decreased slightly today. This is likely the result of hemodilution plus  ongoing oozing from his retroperitoneal hematoma. Clinically, he has improved. His abdominal pain has decreased. I will let him eat today. I did repeat his CT scan. The retroperitoneal hematoma has not significantly changed. There is no evidence of aortic rupture.  At this point we'll continue with supportive care of his retroperitoneal hematoma.  Durene Cal

## 2012-07-25 DIAGNOSIS — I714 Abdominal aortic aneurysm, without rupture: Secondary | ICD-10-CM

## 2012-07-25 LAB — GLUCOSE, CAPILLARY
Glucose-Capillary: 103 mg/dL — ABNORMAL HIGH (ref 70–99)
Glucose-Capillary: 122 mg/dL — ABNORMAL HIGH (ref 70–99)

## 2012-07-25 LAB — BASIC METABOLIC PANEL
BUN: 19 mg/dL (ref 6–23)
Calcium: 8 mg/dL — ABNORMAL LOW (ref 8.4–10.5)
GFR calc non Af Amer: 74 mL/min — ABNORMAL LOW (ref >90)
Glucose, Bld: 107 mg/dL — ABNORMAL HIGH (ref 70–99)
Sodium: 135 mEq/L (ref 135–145)

## 2012-07-25 MED ORDER — POLYETHYLENE GLYCOL 3350 17 GM/SCOOP PO POWD: 1.0000 | Freq: Every day | ORAL | Status: DC

## 2012-07-25 MED ORDER — BISACODYL 10 MG RE SUPP: 10.0000 mg | Freq: Every day | RECTAL | Status: DC | PRN

## 2012-07-25 MED ORDER — POLYETHYLENE GLYCOL 3350 17 G PO PACK
17.0000 g | PACK | Freq: Every day | ORAL | Status: DC
  Administered 2012-07-25 – 2012-07-28 (×4): 17 g via ORAL
  Filled 2012-07-25 (×4): qty 1

## 2012-07-25 NOTE — Progress Notes (Addendum)
[facility] [place] is a 79 y.o. male who is with known AAA. New CT scan yesterday report -  stable retroperitoneal hematoma, no extravasation from AAA. Pt is doing well. has not had BM  IMAGING: Ct Angio Abd/pel W/ And/or W/o  07/24/2012  *RADIOLOGY REPORT*  Clinical Data: Retroperitoneal hematoma  CT ANGIOGRAPHY ABDOMEN AND PELVIS  Technique:  Multidetector CT imaging of the abdomen and pelvis was performed using the standard protocol before and during bolus administration of intravenous contrast.  Multiplanar reconstructed images including MIPs were obtained and reviewed to evaluate the vascular anatomy.  Contrast: OMNIPAQUE IOHEXOL 350 MG/ML SOLN  Comparison:  07/22/2012  Arterial findings: Aorta:                  Scattered calcified plaque in the visualized distal descending thoracic and suprarenal abdominal aorta.  A fusiform infrarenal aneurysm measuring 5 cm maximal transverse diameter as before. Aorta tapers to   2.7 cm at the bifurcation.  No intraluminal thrombus.  No evidence of active extravasation.  Celiac axis:            Mild ostial plaque, unremarkable distal branching  Superior mesenteric:Scattered plaque, no high-grade stenosis  Left renal:             Single, with scattered plaque, no high- grade stenosis.  Right renal:            Single, with scattered plaque, no high- grade proximal stenosis.  Inferior mesenteric:Patent, arising from the aneurysmal segment of the aorta.  Left iliac:             Scattered atheromatous plaque throughout without aneurysm, dissection or stenosis.  The stent in the proximal SFA is partially visualized.  The  Right iliac:            Scattered calcified plaque without high- grade stenosis, aneurysm or dissection.  Venous findings:  Patent hepatic veins, portal vein, superior mesenteric vein, splenic vein, bilateral renal veins, IVC, and iliac venous system.   Review of the MIP images confirms the above findings.   Nonvascular findings: 9.5 x 11 x 15.9 cm left retroperitoneal hematoma without convincing interval increase in size since the previous study.  No evidence of active extravasation.  Small bilateral pleural effusions persist, with dependent atelectasis/consolidation posteriorly in both lower lobes, left greater than right.  There is four-chamber cardiac enlargement. Previous median sternotomy.  Gallbladder physiologically distended with multiple small layering partially calcified calculi in its dependent aspect.  Unremarkable liver.  Scattered splenic granulomas.  Small bilateral renal cysts.  Mild pancreatic parenchymal atrophy.  There are streaky inflammatory/edematous changes in the retroperitoneum anterior to the aorta and extending across the midline anterior to the   right psoas musculature in the pelvis, contiguous with the left retroperitoneal hematoma. Stomach, small bowel, and colon are non distended.  The   urinary bladder is incompletely distended.  There is a trace amount of pelvic ascites.  No free air.  No adenopathy localized.  Multilevel spondylitic changes and scoliosis in the lumbar spine.  IMPRESSION:  1.  Stable   large left retroperitoneal hematoma.  No evidence of active extravasation or other acute arterial abnormality. 2.  Stable 5 cm fusiform abdominal aortic aneurysm. 3.  Cholelithiasis.   Original Report Authenticated By: D. Andria Rhein, MD     Significant Diagnostic Studies: CBC Lab Results  Component Value Date   WBC 17.0* 07/24/2012   HGB 9.2* 07/24/2012  HCT 27.8* 07/24/2012   MCV 93.9 07/24/2012   PLT 142* 07/24/2012    BMET    Component Value Date/Time   NA 133* 07/24/2012 0430   K 4.0 07/24/2012 0430   CL 98 07/24/2012 0430   CO2 28 07/24/2012 0430   GLUCOSE 104* 07/24/2012 0430   BUN 23 07/24/2012 0430   CREATININE 0.93 07/24/2012 0430   CREATININE 1.05 05/08/2012 1052   CALCIUM 7.8* 07/24/2012 0430   GFRNONAA 73* 07/24/2012 0430   GFRAA 84* 07/24/2012 0430     COAG Lab Results  Component Value Date   INR 1.16 07/24/2012   INR 1.28 07/22/2012   INR 1.08 07/21/2012   No results found for this basename: PTT    I/O last 3 completed shifts: In: 3410 [P.O.:760; I.V.:2300; Blood:350] Out: 2020 [Urine:2020]    Physical Examination BP Readings from Last 3 Encounters:  07/25/12 107/62  07/15/12 134/77  07/10/12 116/73   Temp Readings from Last 3 Encounters:  07/25/12 97.6 F (36.4 C) Axillary  07/15/12 97.3 F (36.3 C) Oral  05/27/12 97.9 F (36.6 C) Oral   SpO2 Readings from Last 3 Encounters:  07/25/12 95%  07/15/12 91%  05/13/12 89%   Pulse Readings from Last 3 Encounters:  07/25/12 82  07/15/12 80  07/10/12 72    General: A&O x 3, WDWN male in NAD Pulmonary: normal non-labored breathing , without Rales, rhonchi,  wheezing Cardiac: Heart rate : irregular ,  Abdomen:abdomen soft, non-tender and normal active bowel sounds Neurologic: A&O X 3; Appropriate Affect ; SENSATION: normal; MOTOR FUNCTION:  moving all extremities equally. Speech is fluent/normal  Vascular Exam:BLE warm and well perfused Extremities without ischemic changes, no Gangrene, no cellulitis; no open wounds;   Assessment/Plan: [name] is a 79 y.o. male who has stable retroperitoneal bleed and know AAA New CT confirms hematoma and AAA stable H/H remain stable ABD less tender every day Prob OOB today NSL IVF Poss transfer today       Marlowe Shores [phone] 07/25/2012 7:45 AM      I have examined the patient, reviewed and agree with above. Abdomen soft nontender. Remains hemodynamically stable Rafia Shedden, MD 07/25/2012 12:19 PM

## 2012-07-26 LAB — TYPE AND SCREEN
ABO/RH(D): O POS
Antibody Screen: POSITIVE
Unit division: 0
Unit division: 0
Unit division: 0

## 2012-07-26 LAB — CBC
MCH: 30.9 pg (ref 26.0–34.0)
MCV: 96.1 fL (ref 78.0–100.0)
Platelets: 159 10*3/uL (ref 150–400)
RDW: 17.5 % — ABNORMAL HIGH (ref 11.5–15.5)

## 2012-07-26 LAB — GLUCOSE, CAPILLARY
Glucose-Capillary: 120 mg/dL — ABNORMAL HIGH (ref 70–99)
Glucose-Capillary: 128 mg/dL — ABNORMAL HIGH (ref 70–99)

## 2012-07-26 MED ORDER — BISACODYL 10 MG RE SUPP
10.0000 mg | Freq: Once | RECTAL | Status: AC
  Administered 2012-07-26: 10 mg via RECTAL
  Filled 2012-07-26: qty 1

## 2012-07-26 MED ORDER — FUROSEMIDE 10 MG/ML IJ SOLN
20.0000 mg | Freq: Once | INTRAMUSCULAR | Status: AC
  Administered 2012-07-27: 20 mg via INTRAVENOUS
  Filled 2012-07-26: qty 2

## 2012-07-26 NOTE — Progress Notes (Signed)
NCM spoke to pt and [facility] for [facility] requested. Orders received and [facility] contact info added to dc instructions. Waiting dc date. Isidoro Donning RN CCM Case Mgmt phone [phone]

## 2012-07-26 NOTE — Progress Notes (Addendum)
[facility] [place]  History of Present Illness  [name] is a 79 y.o. male who is  With known AAA - intact and retroperitoneal bleed  Pt is doing well. denies incisional pain; denies nausea/vomiting; denies diarrhea. has had flatus;has not had BM. ABD discomfort much less. Eating well  Significant Diagnostic Studies: CBC Lab Results  Component Value Date   WBC 17.0* 07/24/2012   HGB 9.2* 07/24/2012   HCT 27.8* 07/24/2012   MCV 93.9 07/24/2012   PLT 142* 07/24/2012    BMET    Component Value Date/Time   NA 135 07/25/2012 0630   K 3.8 07/25/2012 0630   CL 100 07/25/2012 0630   CO2 28 07/25/2012 0630   GLUCOSE 107* 07/25/2012 0630   BUN 19 07/25/2012 0630   CREATININE 0.89 07/25/2012 0630   CREATININE 1.05 05/08/2012 1052   CALCIUM 8.0* 07/25/2012 0630   GFRNONAA 74* 07/25/2012 0630   GFRAA 86* 07/25/2012 0630    COAG Lab Results  Component Value Date   INR 1.16 07/24/2012   INR 1.28 07/22/2012   INR 1.08 07/21/2012   No results found for this basename: PTT    I/O last 3 completed shifts: In: 840 [P.O.:240; I.V.:600] Out: 1325 [Urine:1325]    Physical Examination BP Readings from Last 3 Encounters:  07/26/12 110/47  07/15/12 134/77  07/10/12 116/73   Temp Readings from Last 3 Encounters:  07/26/12 97.6 F (36.4 C) Oral  07/15/12 97.3 F (36.3 C) Oral  05/27/12 97.9 F (36.6 C) Oral   SpO2 Readings from Last 3 Encounters:  07/26/12 94%  07/15/12 91%  05/13/12 89%   Pulse Readings from Last 3 Encounters:  07/26/12 85  07/15/12 80  07/10/12 72    General: A&O x 3, WDWN male in NAD Pulmonary: normal non-labored breathing   Abdomen:abdomen soft, non-tender and quiet today LLQ no C/O pain with palpation   Assessment/Plan: [name] is a 79 y.o. male who has retroperitoneal bleed and known AAA - not ruptured or leaking Acute blood loss anemia stable - CBC pend OOB PT for ambulation       Leonard  [name] [phone] 07/26/2012 8:55 AM

## 2012-07-27 DIAGNOSIS — I714 Abdominal aortic aneurysm, without rupture: Secondary | ICD-10-CM

## 2012-07-27 DIAGNOSIS — J9600 Acute respiratory failure, unspecified whether with hypoxia or hypercapnia: Secondary | ICD-10-CM

## 2012-07-27 LAB — CBC
MCH: 31.5 pg (ref 26.0–34.0)
MCHC: 32.3 g/dL (ref 30.0–36.0)
MCV: 97.4 fL (ref 78.0–100.0)
Platelets: 173 10*3/uL (ref 150–400)
RBC: 3.08 MIL/uL — ABNORMAL LOW (ref 4.22–5.81)
RDW: 17.8 % — ABNORMAL HIGH (ref 11.5–15.5)

## 2012-07-27 LAB — GLUCOSE, CAPILLARY

## 2012-07-27 MED ORDER — POTASSIUM CHLORIDE CRYS ER 20 MEQ PO TBCR
20.0000 meq | EXTENDED_RELEASE_TABLET | Freq: Two times a day (BID) | ORAL | Status: DC
  Administered 2012-07-27 – 2012-07-28 (×3): 20 meq via ORAL
  Filled 2012-07-27 (×4): qty 1

## 2012-07-27 MED ORDER — GUAIFENESIN ER 600 MG PO TB12: 600.0000 mg | ORAL_TABLET | Freq: Two times a day (BID) | ORAL | Status: DC | PRN

## 2012-07-27 MED ORDER — BLISTEX EX OINT: TOPICAL_OINTMENT | CUTANEOUS | Status: DC | PRN

## 2012-07-27 MED ORDER — DILTIAZEM HCL 60 MG PO TABS
120.0000 mg | ORAL_TABLET | Freq: Two times a day (BID) | ORAL | Status: DC
  Administered 2012-07-27 – 2012-07-28 (×3): 120 mg via ORAL
  Filled 2012-07-27 (×4): qty 2

## 2012-07-27 MED ORDER — FUROSEMIDE 40 MG PO TABS
60.0000 mg | ORAL_TABLET | Freq: Every day | ORAL | Status: DC
  Administered 2012-07-27 – 2012-07-28 (×2): 60 mg via ORAL
  Filled 2012-07-27 (×2): qty 1

## 2012-07-27 MED ORDER — TAMSULOSIN HCL 0.4 MG PO CAPS: 0.4000 mg | ORAL_CAPSULE | Freq: Every day | ORAL | Status: DC

## 2012-07-27 MED ORDER — OXYMETAZOLINE HCL 0.05 % NA SOLN: 1.0000 | Freq: Every day | NASAL | Status: DC | PRN

## 2012-07-27 NOTE — Progress Notes (Signed)
Rehab Admissions Coordinator Note:  Patient was screened by Brock Ra for appropriateness for an Inpatient Acute Rehab Consult.  Pt's insurance will not cover CIR for his diagnoses.  At this time, we are recommending Skilled Nursing Facility.    Brock Ra 07/27/2012, 11:10 AM  I can be reached at [phone].

## 2012-07-27 NOTE — Progress Notes (Signed)
[facility] [place]   History of Present Illness  [name] is a 79 y.o. male who is doing well. denies abdominal pain; denies nausea/vomiting. Beginning to ambulate  IMAGING: No results found.  Significant Diagnostic Studies: CBC Lab Results  Component Value Date   WBC 15.0* 07/26/2012   HGB 9.6* 07/26/2012   HCT 29.9* 07/26/2012   MCV 96.1 07/26/2012   PLT 159 07/26/2012    BMET    Component Value Date/Time   NA 135 07/25/2012 0630   K 3.8 07/25/2012 0630   CL 100 07/25/2012 0630   CO2 28 07/25/2012 0630   GLUCOSE 107* 07/25/2012 0630   BUN 19 07/25/2012 0630   CREATININE 0.89 07/25/2012 0630   CREATININE 1.05 05/08/2012 1052   CALCIUM 8.0* 07/25/2012 0630   GFRNONAA 74* 07/25/2012 0630   GFRAA 86* 07/25/2012 0630    COAG Lab Results  Component Value Date   INR 1.16 07/24/2012   INR 1.28 07/22/2012   INR 1.08 07/21/2012   No results found for this basename: PTT    I/O last 3 completed shifts: In: 840 [P.O.:840] Out: 1300 [Urine:1300]    Physical Examination BP Readings from Last 3 Encounters:  07/27/12 112/66  07/15/12 134/77  07/10/12 116/73   Temp Readings from Last 3 Encounters:  07/27/12 98 F (36.7 C) Oral  07/15/12 97.3 F (36.3 C) Oral  05/27/12 97.9 F (36.6 C) Oral   SpO2 Readings from Last 3 Encounters:  07/27/12 94%  07/15/12 91%  05/13/12 89%   Pulse Readings from Last 3 Encounters:  07/27/12 78  07/15/12 80  07/10/12 72    General: A&O x 3, WDWN male in NAD Pulmonary: normal non-labored breathing ,  Abdomen:abdomen soft, non-tender and normal active bowel sounds   Assessment/Plan: [name] is a 79 y.o. male who is comfortable and eating. Needs to ambulate today ? DC - lives with daughter    Marlowe Shores [phone] 07/27/2012 7:54 AM

## 2012-07-27 NOTE — Progress Notes (Signed)
PULMONARY  / CRITICAL CARE MEDICINE  Name: [name] MRN: [id number] DOB: 1921/09/19    REFERRING MD :  Coral Else  CHIEF COMPLAINT:  Flank pain.  BRIEF PATIENT DESCRIPTION:  79 yo male former smoker admitted on 07/21/2012 with flank pain from retroperitoneal hematoma.  PCCM consulted 1/29 for medical management while in ICU.  He was in hospital 1/19 to 1/22 for AECOPD.  Significant PMHx of COPD, Chronic hypoxic respiratory failure (3L oxygen 24/7),  HTN, Hyperlipidemia, CAD s/p CABG, MVP s/p MVR, A fib, CHF, Prostate cancer, DM, AAA, Thoracic aortic aneurysm, PUD   SIGNIFICANT EVENTS: 1/28 Transfer from [facility] to [facility]  STUDIES:  CT chest 1/28 >> 5 cm ascending thoracic aortic aneurysm no change, small Lt effusion with ATX CT abd/pelvis 1/28 >> 10x10x20 cm Lt retroperitoneal hematoma of iliopsoas muscle, 5 cm infrarenal AAA, changes of cirrhosis, gallbladder sludge CT abd/pelvis 1/29 >> trace Lt effusion, basilar ATX, splenic granuloma, b/l renal cysts, colonic diverticulosis, 12x10.7x21.6 cm Lt retroperitoneal hematoma, bladder wall thickening  SUBJECTIVE:  Working with PT/OT   VITAL SIGNS: Temp:  [97.9 F (36.6 C)-98 F (36.7 C)] 98 F (36.7 C) (02/03 0359) Pulse Rate:  [78-89] 78  (02/03 0359) Resp:  [18-24] 24  (02/03 0359) BP: (101-124)/(53-68) 112/66 mmHg (02/03 0359) SpO2:  [92 %-96 %] 94 % (02/03 0500)  PHYSICAL EXAMINATION: General:  No distress Neuro:  Alert, normal strength, follows commands HEENT:  No sinus tenderness Cardiovascular:  s1s2 irregular Lungs:  Decreased breath sounds, no wheeze Abdomen:  Soft, decreased flank pain Musculoskeletal:  No edema Skin:  No rashes   Lab 07/25/12 0630 07/24/12 0430 07/23/12 0200  NA 135 133* 137  K 3.8 4.0 4.5  CL 100 98 100  CO2 28 28 30   BUN 19 23 37*  CREATININE 0.89 0.93 1.29  GLUCOSE 107* 104* 181*    Lab 07/27/12 0700 07/26/12 0935 07/24/12 2105 07/24/12 1039  HGB 9.7* 9.6* 9.2* --  HCT 30.0*  29.9* 27.8* --  WBC 13.5* 15.0* -- 17.0*  PLT 173 159 -- 142*    Lab Results  Component Value Date   ALT 24 07/23/2012   AST 23 07/23/2012   ALKPHOS 45 07/23/2012   BILITOT 1.0 07/23/2012    No results found. CBG (last 3)   Basename 07/27/12 0606 07/26/12 2106 07/26/12 1640  GLUCAP 117* 128* 132*     ASSESSMENT / PLAN:  Lt flank pain 2nd to retroperitoneal hematoma. Hx of AAA. Plan: Following clinically, and CBC, no evidence active blood loss  COPD with recent exacerbation. Completed Abx and prednisone 1/24. Chronic hypoxic respiratory failure. Small Lt pleural effusion. Plan: Continue dulera bid (was on Advair prior to admit) PRN albuterol/ipratropium Bronchial hygiene Adjust oxygen to keep SpO2 > 92%  Hx of CAD, HTN, A fib, MVR, hyperlipidemia, chronic diastolic CHF. Not coumadin candidate.  Followed by [facility] cardiology as outpt. 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 Plan: SSI  Anemia. Likely from chronic disease, hx of iron deficiency, and retroperitoneal hematoma.  Followed by hematology as outpt [name]) Plan: Follow CBC SCD for DVT prophylaxis  Nutrition. Plan: Resumed diet 1/30 Continue IV fluid  Hx of prostate  cancer. Oliguria. Plan: Flomax 0.4 mg q  Hx of PUD. Plan: Protonix  Changes of cirrhosis noted on CT abd 1/28. Plan: Monitor LFT's intermittently  Resolved problems >> Hyponatremia, hypotension, hyperkalemia  PCCM will sign off. If he is to stay in hospital longer then consider consulting [facility] for IM management  Levy Pupa, MD, PhD 07/27/2012, 10:10 AM [facility] [phone] or if no answer  [phone]

## 2012-07-27 NOTE — Evaluation (Signed)
Physical Therapy Evaluation Patient Details Name: [name] MRN: [id number] DOB: 03/20/1922 Today's Date: 07/27/2012 Time: 4098-1191 PT Time Calculation (min): 41 min  PT Assessment / Plan / Recommendation Clinical Impression  79 yo adm with retroperitoneal hematoma (s/p vigorous activity with OPPT). Pt with generalized weakness, pain, and decr SaO2 limiting his activity. Reports his daughter is home "most of the time." Anticipate pt may be slow to progress and return to a modified independent status (due to pain). Will benefit from PT to incr safety and independence with mobility prior to d/c home with daughter.    PT Assessment  Patient needs continued PT services    Follow Up Recommendations  CIR;Supervision for mobility/OOB    Does the patient have the potential to tolerate intense rehabilitation    potentially  Barriers to Discharge Decreased caregiver support      Equipment Recommendations  None recommended by PT    Recommendations for Other Services OT consult;Rehab consult   Frequency Min 3X/week    Precautions / Restrictions Precautions Precautions: Fall Restrictions Weight Bearing Restrictions: No   Pertinent Vitals/Pain See doc flowsheets (SaO2 to 81% on 4L with gait)      Mobility  Bed Mobility Bed Mobility: Not assessed (pt just OOB to chair with nursign) Transfers Transfers: Sit to Stand;Stand to Sit Sit to Stand: 3: Mod assist;With upper extremity assist;With armrests;From chair/3-in-1 Stand to Sit: 4: Min guard;With upper extremity assist;With armrests;To chair/3-in-1;To elevated surface Details for Transfer Assistance: required 4 attempts (with rest break after first 2 attempts) to come to standing; pt with poor ability to flex forward at hips to come over his base of support due to c/o Lt "hip"/flank pain (8/10); once standing, placed pillows in the seat of his chair to elevate him for next transfer Ambulation/Gait Ambulation/Gait Assistance: 4:  Min assist Ambulation Distance (Feet): 20 Feet Assistive device: Rolling walker Ambulation/Gait Assistance Details: Pt pushes RW too far ahead with flexed posture; wide base of support with feet nearly outside legs of RW;  Gait Pattern: Step-through pattern;Decreased stride length;Wide base of support;Trunk flexed    Exercises General Exercises - Lower Extremity Ankle Circles/Pumps: AROM;Both;10 reps;Seated Gluteal Sets: AROM;Both;5 reps;Standing Long Arc Quad: AROM;Both;5 reps;Seated   PT Diagnosis: Difficulty walking;Acute pain;Generalized weakness  PT Problem List: Decreased strength;Decreased activity tolerance;Decreased balance;Decreased mobility;Decreased knowledge of use of DME;Decreased safety awareness;Cardiopulmonary status limiting activity;Pain;Obesity PT Treatment Interventions: DME instruction;Gait training;Functional mobility training;Therapeutic activities;Therapeutic exercise;Patient/family education   PT Goals Acute Rehab PT Goals PT Goal Formulation: With patient Time For Goal Achievement: 08/10/12 Potential to Achieve Goals: Good Pt will go Supine/Side to Sit: with supervision;with HOB 0 degrees PT Goal: Supine/Side to Sit - Progress: Goal set today Pt will go Sit to Supine/Side: with supervision;with HOB 0 degrees PT Goal: Sit to Supine/Side - Progress: Goal set today Pt will go Sit to Stand: with supervision;with upper extremity assist PT Goal: Sit to Stand - Progress: Goal set today Pt will go Stand to Sit: with supervision;with upper extremity assist PT Goal: Stand to Sit - Progress: Goal set today Pt will Ambulate: 51 - 150 feet;with supervision;with least restrictive assistive device;Other (comment) (SaO2 >88%) PT Goal: Ambulate - Progress: Goal set today  Visit Information  Last PT Received On: 07/27/12 Assistance Needed: +1    Subjective Data  Subjective: "I can't get any rest around here!" Patient Stated Goal: go home   Prior Functioning  Home  Living Lives With: Daughter Available Help at Discharge: Family;Available PRN/intermittently Type of Home: Mobile  home Home Access: Level entry Home Layout: One level Bathroom Shower/Tub: Health visitor: Handicapped height Bathroom Accessibility: Yes How Accessible: Accessible via walker Home Adaptive Equipment: Shower chair with back;Walker - four wheeled Prior Function Level of Independence: Independent with assistive device(s) Able to Take Stairs?: No (doesn't encounter steps where he goes) Driving: Yes Vocation: Retired Comments: Reports he was doing well before this happened Communication Communication: Other (comment);HOH (breathless quality to voice; difficult to understand at time) Dominant Hand: Right    Cognition  Cognition Overall Cognitive Status: Impaired Area of Impairment: Safety/judgement Arousal/Alertness: Lethargic Orientation Level: Disoriented to;Situation (thought he was hospitalized for a broken hip) Behavior During Session: Lethargic Safety/Judgement: Impulsive;Decreased safety judgement for tasks assessed    Extremity/Trunk Assessment Right Upper Extremity Assessment RUE ROM/Strength/Tone: Deficits RUE ROM/Strength/Tone Deficits: strength 3+-4/5 Left Upper Extremity Assessment LUE ROM/Strength/Tone: Deficits LUE ROM/Strength/Tone Deficits: strength 3+-4/5 Right Lower Extremity Assessment RLE ROM/Strength/Tone: Deficits RLE ROM/Strength/Tone Deficits: AROM WFL; strength 3+-4/5 Left Lower Extremity Assessment LLE ROM/Strength/Tone: Deficits LLE ROM/Strength/Tone Deficits: AROM WFL; strength 3+-4/5 Trunk Assessment Trunk Assessment: Normal   Balance Balance Balance Assessed: Yes Static Sitting Balance Static Sitting - Balance Support: Bilateral upper extremity supported;Feet supported Static Sitting - Level of Assistance: 4: Min assist Static Sitting - Comment/# of Minutes: at edge of chair; pt leaning posteriorly due to pain in Lt  side Static Standing Balance Static Standing - Balance Support: Bilateral upper extremity supported Static Standing - Level of Assistance: 4: Min assist  End of Session PT - End of Session Equipment Utilized During Treatment: Gait belt;Oxygen Activity Tolerance: Patient limited by fatigue;Patient limited by pain Patient left: in chair;with call bell/phone within reach Nurse Communication: Mobility status;Other (comment) (skin tear RUE (on arrival); decr SaO2 on 4L)  GP     Julann Mcgilvray 07/27/2012, 10:40 AM  Pager [phone]

## 2012-07-27 NOTE — Progress Notes (Signed)
  Subjective -  Wants to get up  Was in chair last night  abd pain resolved   Physical Exam:  Left flank ecchymosis  abd soft  CV: RRR  Pulm : CTA   Assessment/Plan: RP Hematoma  HCT stable past 24 hours  D/c foley  OOB with PT  Possibly d/c tomorrow if ambulating and HCT unchanged   Jaion Lagrange IV, V. WELLS  07/27/2012  7:23 AM    Filed Vitals:   07/27/12 0359  BP: 112/66  Pulse: 78  Temp: 98 F (36.7 C)  Resp: 24    Intake/Output Summary (Last 24 hours) at 07/27/12 0727 Last data filed at 07/26/12 1917  Gross per 24 hour  Intake    360 ml  Output    250 ml  Net    110 ml     Laboratory CBC    Component Value Date/Time   WBC 15.0* 07/26/2012 0935   HGB 9.6* 07/26/2012 0935   HCT 29.9* 07/26/2012 0935   PLT 159 07/26/2012 0935    BMET    Component Value Date/Time   NA 135 07/25/2012 0630   K 3.8 07/25/2012 0630   CL 100 07/25/2012 0630   CO2 28 07/25/2012 0630   GLUCOSE 107* 07/25/2012 0630   BUN 19 07/25/2012 0630   CREATININE 0.89 07/25/2012 0630   CREATININE 1.05 05/08/2012 1052   CALCIUM 8.0* 07/25/2012 0630   GFRNONAA 74* 07/25/2012 0630   GFRAA 86* 07/25/2012 0630    COAG Lab Results  Component Value Date   INR 1.16 07/24/2012   INR 1.28 07/22/2012   INR 1.08 07/21/2012   No results found for this basename: PTT    Antibiotics Anti-infectives    None       V. Charlena Cross, M.D. [facility] [place] Office: [phone] Pager:  [phone]

## 2012-07-28 ENCOUNTER — Telehealth: Payer: Self-pay | Admitting: Family Medicine

## 2012-07-28 ENCOUNTER — Other Ambulatory Visit: Payer: Self-pay | Admitting: *Deleted

## 2012-07-28 ENCOUNTER — Telehealth (HOSPITAL_COMMUNITY): Payer: Self-pay | Admitting: Oncology

## 2012-07-28 ENCOUNTER — Inpatient Hospital Stay
Admission: RE | Admit: 2012-07-28 | Discharge: 2012-07-31 | Disposition: A | Discharge status: Nursing Home (Includes ICF) | Payer: Medicare Other | Source: Ambulatory Visit | Attending: Internal Medicine | Admitting: Internal Medicine

## 2012-07-28 DIAGNOSIS — I711 Thoracic aortic aneurysm, ruptured: Secondary | ICD-10-CM

## 2012-07-28 DIAGNOSIS — R58 Hemorrhage, not elsewhere classified: Secondary | ICD-10-CM

## 2012-07-28 LAB — GLUCOSE, CAPILLARY: Glucose-Capillary: 133 mg/dL — ABNORMAL HIGH (ref 70–99)

## 2012-07-28 LAB — BASIC METABOLIC PANEL
BUN: 18 mg/dL (ref 6–23)
Creatinine, Ser: 0.96 mg/dL (ref 0.50–1.35)
GFR calc Af Amer: 83 mL/min — ABNORMAL LOW (ref >90)
GFR calc non Af Amer: 72 mL/min — ABNORMAL LOW (ref >90)
Glucose, Bld: 116 mg/dL — ABNORMAL HIGH (ref 70–99)

## 2012-07-28 LAB — CBC
HCT: 30.7 % — ABNORMAL LOW (ref 39.0–52.0)
Hemoglobin: 9.8 g/dL — ABNORMAL LOW (ref 13.0–17.0)
MCH: 30.9 pg (ref 26.0–34.0)
MCHC: 31.9 g/dL (ref 30.0–36.0)
MCV: 96.8 fL (ref 78.0–100.0)
RDW: 18 % — ABNORMAL HIGH (ref 11.5–15.5)

## 2012-07-28 MED ORDER — OXYCODONE-ACETAMINOPHEN 5-325 MG PO TABS: 1.0000 | ORAL_TABLET | ORAL | Status: DC | PRN

## 2012-07-28 NOTE — Progress Notes (Signed)
[facility] [place]  HISTORY OF PRESENT ILLNESS:  This is an 79 yo male well known to me, having undergone L LE stenting for limb salvage. I have been following a infrarenal aneurysm. I last saw him in )ctober of 2013. At that time, ultrasound revealed a 4.6 cm AAA, which was essentially unchanged from a CT scan in January of 2013. According to his daughter, he was participating in physical therapy and doing vigorous activities with weights and a lot of trunk twisting. He developed a large knot on his flank and buttocks on Monday which was very painful. His daughter massaged it and it felt better. He had trouble sleeping from pain on Tuesday night, and mid day Tuesday he called his HHN and stated that his pain was worse and that he was having pain in his back when he moved his legs. He was instructed to call 911. He was taken to [facility] via ambulance. A Ct scan revealed his aneurysm to be about 5cm, and there was a retroperitoneal hematoma on the left. It was felt that this likely represented a spontaneous retroperitoneal hematoma and not a ruptured AAA. He initially had issues with hypotension, however he quickly stabilized with fluid. He was transferred to [facility] for further evaluation and care.  The patient was recently in the hospital for respiratory insufficiency, felt to be due to a COPD exacerbation vs CHF. He had an episode of AFIB with RVR. It was rate controlled with PO cardizem. He is not a candidate for anti-coagulation because of GI bleeding. He is on home oxygen.        Objective 138/75 80 98.7 F (37.1 C) (Oral) 18 96%  Intake/Output Summary (Last 24 hours) at 07/28/12 0829 Last data filed at 07/28/12 1610  Gross per 24 hour  Intake   1080 ml  Output   1075 ml  Net      5 ml   General: A&O x 3, WDWN male in NAD  Pulmonary: normal non-labored breathing ,  Abdomen:abdomen soft, non-tender and normal active bowel sounds     Assessment/Planning: Retroperitoneal hematoma Pending talk with daughter home with hh verses SNF due to weakness     Clinton Gallant [facility] 07/28/2012 8:29 AM --  Laboratory Lab Results:  Basename 07/28/12 0520 07/27/12 0700  WBC 12.4* 13.5*  HGB 9.8* 9.7*  HCT 30.7* 30.0*  PLT 162 173   BMET  Basename 07/28/12 0520  NA 136  K 4.0  CL 99  CO2 30  GLUCOSE 116*  BUN 18  CREATININE 0.96  CALCIUM 8.1*    COAG Lab Results  Component Value Date   INR 1.16 07/24/2012   INR 1.28 07/22/2012   INR 1.08 07/21/2012   No results found for this basename: PTT    Antibiotics Anti-infectives    None

## 2012-07-28 NOTE — Clinical Social Work Psychosocial (Signed)
Late entry 07/28/12 for 07/27/12 Clinical Social Work Department BRIEF PSYCHOSOCIAL ASSESSMENT 07/28/2012  Patient:  [name], [name]     Account Number:  [id number]     Admit date:  07/21/2012  Clinical Social Worker:  Thomasene Mohair  Date/Time:  07/27/2012 01:00 PM  Referred by:  RN  Date Referred:  07/27/2012 Referred for  SNF Placement   Other Referral:   Interview type:  Family Other interview type:   Patient sleeping d/t medication    PSYCHOSOCIAL DATA Living Status:  FAMILY Admitted from facility:   Level of care:   Primary support name:  Naida Sleight [phone] cell Primary support relationship to patient:  CHILD, ADULT Degree of support available:   Strong support.  3 adult children and several grandchildren    CURRENT CONCERNS Current Concerns  Post-Acute Placement   Other Concerns:    SOCIAL WORK ASSESSMENT / PLAN CSW was referred to Pt to assist with dc planning. 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  Pt's daughter [name] moved in to assist Pt several years ago; she keeps up with his doctors appts and medications. Pt has been to snf in the past for short-term rehab and has done well. Pt's daughter familiar with options near their home. CSW will begin SNF bed search and give options to family.   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    PATIENT'S/FAMILY'S RESPONSE TO PLAN OF CARE: Pt's daughter is a strong advocate for her father. Pt asleep d/t morphine. CSW will f/u with Pt in the am to go over plans. Pt's daughter is POA as well.    Frederico Hamman, LCSW [phone]

## 2012-07-28 NOTE — Progress Notes (Signed)
Report called to the [facility] in [place], [place]

## 2012-07-28 NOTE — Clinical Social Work Placement (Signed)
Clinical Social Work Department CLINICAL SOCIAL WORK PLACEMENT NOTE 07/28/2012  Patient:  WRIGHT, GRAVELY  Account Number:  [id number] Admit date:  07/21/2012  Clinical Social Worker:  Frederico Hamman, LCSW  Date/time:  07/28/2012 11:35 AM  Clinical Social Work is seeking post-discharge placement for this patient at the following level of care:   SKILLED NURSING   (*CSW will update this form in Epic as items are completed)   07/28/2012  Patient/family provided with [facility] Department of Clinical Social Work's list of facilities offering this level of care within the geographic area requested by the patient (or if unable, by the patient's family).  07/28/2012  Patient/family informed of their freedom to choose among providers that offer the needed level of care, that participate in Medicare, Medicaid or managed care program needed by the patient, have an available bed and are willing to accept the patient.  07/28/2012  Patient/family informed of [facility]' ownership interest in [facility], as well as of the fact that they are under no obligation to receive care at this facility.  PASARR submitted to EDS on  PASARR number received from EDS on   FL2 transmitted to all facilities in geographic area requested by pt/family on  07/28/2012 FL2 transmitted to all facilities within larger geographic area on   Patient informed that his/her managed care company has contracts with or will negotiate with  certain facilities, including the following:     Patient/family informed of bed offers received:  07/28/2012 Patient chooses bed at [facility] Physician recommends and patient chooses bed at    Patient to be transferred to [facility] on  07/28/2012 Patient to be transferred to facility by ptar  The following physician request were entered in Epic:   Additional Comments: Pasarr on file  Pt's daughter at bedside, will follow ambulance.  No further CSW  needs.   Frederico Hamman, LCSW [phone]

## 2012-07-28 NOTE — Discharge Summary (Signed)
[facility] Discharge Summary   Patient ID:  [name] MRN: [id number] DOB/AGE: 1922-03-02 79 y.o.  Admit date: 07/21/2012 Discharge date: 07/28/2012   Admission Diagnosis: Back pain [724.5] AAA (abdominal aortic aneurysm) [441.4] Retroperitoneal bleed [459.0] Abd pain  Discharge Diagnoses:  Back pain [724.5] AAA (abdominal aortic aneurysm) [441.4] Retroperitoneal bleed [459.0] Abd pain  Secondary Diagnoses: Past Medical History  Diagnosis Date  . Mitral valve prolapse 2003    MVR in 2003  . Obesity   . Osteoarthritis   . Hypertension   . Hyperlipidemia   . COPD (chronic obstructive pulmonary disease)   . CHF (congestive heart failure) diastolic 10/2010    CXR in 12/2010: Prior CABG, cardiomegaly, vascular redistribution, bibasilar atelectasis, small effusions  . Pneumonia   . Impaired glucose tolerance   . Atrial fibrillation, chronic   . Prostate cancer     elevated PSA  . GERD (gastroesophageal reflux disease)     + hiatal hernia  . Arteriosclerotic cardiovascular disease (ASCVD) 1994    stent to RCA; CABG-2003  . Hard of hearing   . Anemia, iron deficiency   . AAA (abdominal aortic aneurysm)     Fusiform; infrarenal; 4-4.1cm on CT in 07/2008 and 12/2010 by MRI  . Gastric ulcer 2004    2004; upper GI bleed  . Renal cysts, acquired, bilateral     Complex by MRI in 12/2010  . Adenomatous polyps 04/07/2012  . HYPERTENSION 12/17/2007    Subsequently hypotensive and all antihypertensive medication discontinued Lab  03/2012: Mild anemia with H&H-11.8/37.5, MCV-97, ferritin-224, normal CMet ex G-124, alb-3.4   . NSTEMI (non-ST elevated myocardial infarction) 01/18/2011  . Diabetes mellitus, type II 07/13/12    family denies patient is diabetic    Discharged Condition: good  HPI: [name] is a 79 y.o. male , had undergone L LE stenting for limb salvage by Dr. [name] Gianotti. He has been following a infrarenal aneurysm. He was last seen in  October of 2013. At that time, ultrasound revealed a 4.6 cm AAA, which was essentially unchanged from a CT scan in January of 2013. According to his daughter, he was participating in physical therapy and doing vigorous activities with weights and a lot of trunk twisting. He developed a large knot on his flank and buttocks on Monday which was very painful. His daughter massaged it and it felt better. He had trouble sleeping from pain on Tuesday night, and mid day Tuesday he called his HHN and stated that his pain was worse and that he was having pain in his back when he moved his legs. He was instructed to call 911. He was taken to [facility] via ambulance.   A Ct scan revealed his aneurysm to be about 5cm, and there was a retroperitoneal hematoma on the left. It was felt that this likely represented a spontaneous retroperitoneal hematoma and not a ruptured AAA. He initially had issues with hypotension, however he quickly stabilized with fluid. He was transferred to [facility] for further evaluation and care.  The patient was recently in the hospital for respiratory insufficiency, felt to be due to a COPD exacerbation vs CHF. He had an episode of AFIB with RVR. It was rate controlled with PO cardizem. He is not a candidate for anti-coagulation because of GI bleeding. He is on home oxygen.  He was admitted for observation, BP and pain control  Hospital Course:  [name] is a 79 y.o. male was admitted with a spontaneous  retroperitoneal hematoma and known AAA. CT scan showed that the AAA was intact and not source of bleed. This was confirmed with repeat CT Scan.  His creatinine was mildly elevated. CCM withheld his Lasix and Potassium until CR returned to baseline - these were restarted on 07/27/12 COPD was stable on his breathing treatments Pt left flank and LQ pain is resolving well and he is tol reg diet very well Pt. Ambulating, voiding and taking PO diet without difficulty. Pt pain controlled with  PO pain meds. Labs as below  Consults: CCM    Significant Diagnostic Studies: CBC Lab Results  Component Value Date   WBC 12.4* 07/28/2012   HGB 9.8* 07/28/2012   HCT 30.7* 07/28/2012   MCV 96.8 07/28/2012   PLT 162 07/28/2012    BMET    Component Value Date/Time   NA 136 07/28/2012 0520   K 4.0 07/28/2012 0520   CL 99 07/28/2012 0520   CO2 30 07/28/2012 0520   GLUCOSE 116* 07/28/2012 0520   BUN 18 07/28/2012 0520   CREATININE 0.96 07/28/2012 0520   CREATININE 1.05 05/08/2012 1052   CALCIUM 8.1* 07/28/2012 0520   GFRNONAA 72* 07/28/2012 0520   GFRAA 83* 07/28/2012 0520   COAG Lab Results  Component Value Date   INR 1.16 07/24/2012   INR 1.28 07/22/2012   INR 1.08 07/21/2012     Disposition:  Discharge to :Skilled nursing facility Discharge Orders    Future Appointments: Provider: Department: Dept Phone: Center:   08/05/2012 10:20 AM [facility] [phone] None   08/19/2012 10:20 AM [facility] [phone] None   08/19/2012 10:30 AM Ellouise Newer, PA [facility] [phone] None   09/07/2012 1:00 PM Kerri Perches, MD [facility] [phone] [facility]   09/28/2012 10:00 AM [facility] [phone] [facility]   09/28/2012 10:30 AM [facility] [phone] [facility]   09/28/2012 11:45 AM Nada Libman, MD [facility] [phone] [facility]     Future Orders Please Complete By Expires   Resume previous diet      Call MD for:  temperature >100.5      Call MD for:  redness, tenderness, or signs of infection (pain, swelling, bleeding, redness, odor or green/yellow discharge around incision site)      Call MD for:  severe or increased pain, loss or decreased feeling  in affected limb(s)      May shower       Activity as tolerated - No restrictions         Osmany, Azer  Home Medication Instructions  AYT:[id number]   Printed on:07/28/12 1300  Medication Information                    lovastatin (MEVACOR) 40 MG tablet Take 40 mg by mouth at bedtime. Only takes on Sun, Mon, Wed, and Fri.           oxymetazoline (AFRIN) 0.05 % nasal spray Place 1 spray into the nose daily as needed. Congestion           furosemide (LASIX) 20 MG tablet Take 60 mg by mouth daily.           albuterol (PROVENTIL) (2.5 MG/3ML) 0.083% nebulizer solution Take 3 mLs (2.5 mg total) by nebulization every 6 (six) hours as needed. Mixes with the Ipratropium(for shortness of breath)  Fluticasone-Salmeterol (ADVAIR) 100-50 MCG/DOSE AEPB Inhale 1 puff into the lungs every 12 (twelve) hours.           potassium chloride SA (K-DUR,KLOR-CON) 20 MEQ tablet Take 20 mEq by mouth 2 (two) times daily.           omeprazole (PRILOSEC) 40 MG capsule Take 40 mg by mouth daily.           ipratropium (ATROVENT) 0.02 % nebulizer solution Take 500 mcg by nebulization every 6 (six) hours as needed. Shortness of breath           Tamsulosin HCl (FLOMAX) 0.4 MG CAPS Take 1 capsule (0.4 mg total) by mouth daily.           diltiazem (CARDIZEM) 120 MG tablet Take 1 tablet (120 mg total) by mouth 4 (four) times daily.           Multiple Vitamin (MULTIVITAMIN WITH MINERALS) TABS Take 1 tablet by mouth daily.           guaiFENesin (MUCINEX) 600 MG 12 hr tablet Take 600 mg by mouth 2 (two) times daily as needed. congestion           ferumoxytol (FERAHEME) 510 MG/17ML SOLN Inject 1,020 mg into the vein once. If iron levels deem appropriate           oxyCODONE-acetaminophen (ROXICET) 5-325 MG per tablet Take 1 tablet by mouth every 4 (four) hours as needed for pain.            Verbal and written Discharge instructions given to the patient. Wound care per Discharge AVS Follow-up Information    Follow up with [name] Gianotti IV, Lala Lund, MD. In 1 week. (sent)    Contact information:   [address] [place]  [address] [phone]          Signed: Marlowe Shores 07/28/2012, 1:00 PM

## 2012-07-28 NOTE — Care Management Note (Signed)
    Page 1 of 1   07/28/2012     4:37:43 PM   CARE MANAGEMENT NOTE 07/28/2012  Patient:  [name], [name]   Account Number:  [id number]  Date Initiated:  07/26/2012  Documentation initiated by:  [facility]  Subjective/Objective Assessment:   AAA     Action/Plan:   HH, lives at home with dtr, [name]   Anticipated DC Date:  07/26/2012   Anticipated DC Plan:  HOME W HOME HEALTH SERVICES  In-house referral  Clinical Social Worker      DC Planning Services  CM consult      Choice offered to / List presented to:  C-1 Patient           Status of service:  Completed, signed off Medicare Important Message given?   (If response is "NO", the following Medicare IM given date fields will be blank) Date Medicare IM given:   Date Additional Medicare IM given:    Discharge Disposition:  SKILLED NURSING FACILITY  Per UR Regulation:  Reviewed for med. necessity/level of care/duration of stay  If discussed at Long Length of Stay Meetings, dates discussed:    Comments:  07/28/12 [name] [phone] PT/FAMILY DECIDED THAT SNF WAS NEEDED FOR REHAB.  PT DC'D TO SNF TODAY, PER CSW ARRANGEMENTS.  Leonard Cousin, RN Case Manager Signed CASE MANAGEMENT Progress Notes 07/26/2012 3:25 PM NCM spoke to pt and [facility] for [facility] requested. Orders received and [facility] contact info added to dc instructions. Waiting dc date. Leonard Donning RN CCM Case Mgmt phone [phone]

## 2012-07-28 NOTE — Telephone Encounter (Signed)
[name] not available to answer the call, unable to leave a message

## 2012-07-29 ENCOUNTER — Telehealth: Payer: Self-pay | Admitting: Family Medicine

## 2012-07-29 ENCOUNTER — Ambulatory Visit: Payer: Medicare Other | Admitting: Family Medicine

## 2012-07-29 NOTE — Telephone Encounter (Signed)
noted 

## 2012-07-31 ENCOUNTER — Inpatient Hospital Stay (HOSPITAL_COMMUNITY)
Admission: EM | Admit: 2012-07-31 | Discharge: 2012-08-04 | DRG: 291 | Disposition: A | Discharge status: Home Health | Payer: Medicare Other | Attending: Internal Medicine | Admitting: Internal Medicine

## 2012-07-31 ENCOUNTER — Emergency Department (HOSPITAL_COMMUNITY): Payer: Medicare Other

## 2012-07-31 ENCOUNTER — Inpatient Hospital Stay (HOSPITAL_COMMUNITY): Payer: Medicare Other

## 2012-07-31 ENCOUNTER — Encounter (HOSPITAL_COMMUNITY): Payer: Self-pay | Admitting: *Deleted

## 2012-07-31 DIAGNOSIS — J9610 Chronic respiratory failure, unspecified whether with hypoxia or hypercapnia: Secondary | ICD-10-CM

## 2012-07-31 DIAGNOSIS — I341 Nonrheumatic mitral (valve) prolapse: Secondary | ICD-10-CM

## 2012-07-31 DIAGNOSIS — D649 Anemia, unspecified: Secondary | ICD-10-CM

## 2012-07-31 DIAGNOSIS — F0390 Unspecified dementia without behavioral disturbance: Secondary | ICD-10-CM | POA: Diagnosis present

## 2012-07-31 DIAGNOSIS — M1710 Unilateral primary osteoarthritis, unspecified knee: Secondary | ICD-10-CM

## 2012-07-31 DIAGNOSIS — J441 Chronic obstructive pulmonary disease with (acute) exacerbation: Secondary | ICD-10-CM

## 2012-07-31 DIAGNOSIS — E739 Lactose intolerance, unspecified: Secondary | ICD-10-CM

## 2012-07-31 DIAGNOSIS — I2510 Atherosclerotic heart disease of native coronary artery without angina pectoris: Secondary | ICD-10-CM

## 2012-07-31 DIAGNOSIS — Z6830 Body mass index (BMI) 30.0-30.9, adult: Secondary | ICD-10-CM

## 2012-07-31 DIAGNOSIS — R739 Hyperglycemia, unspecified: Secondary | ICD-10-CM

## 2012-07-31 DIAGNOSIS — I714 Abdominal aortic aneurysm, without rupture, unspecified: Secondary | ICD-10-CM | POA: Diagnosis present

## 2012-07-31 DIAGNOSIS — E669 Obesity, unspecified: Secondary | ICD-10-CM

## 2012-07-31 DIAGNOSIS — Z79899 Other long term (current) drug therapy: Secondary | ICD-10-CM

## 2012-07-31 DIAGNOSIS — D509 Iron deficiency anemia, unspecified: Secondary | ICD-10-CM

## 2012-07-31 DIAGNOSIS — T380X5A Adverse effect of glucocorticoids and synthetic analogues, initial encounter: Secondary | ICD-10-CM

## 2012-07-31 DIAGNOSIS — J309 Allergic rhinitis, unspecified: Secondary | ICD-10-CM

## 2012-07-31 DIAGNOSIS — Z8701 Personal history of pneumonia (recurrent): Secondary | ICD-10-CM

## 2012-07-31 DIAGNOSIS — D508 Other iron deficiency anemias: Secondary | ICD-10-CM

## 2012-07-31 DIAGNOSIS — I509 Heart failure, unspecified: Secondary | ICD-10-CM

## 2012-07-31 DIAGNOSIS — M1990 Unspecified osteoarthritis, unspecified site: Secondary | ICD-10-CM | POA: Diagnosis present

## 2012-07-31 DIAGNOSIS — E785 Hyperlipidemia, unspecified: Secondary | ICD-10-CM

## 2012-07-31 DIAGNOSIS — I252 Old myocardial infarction: Secondary | ICD-10-CM

## 2012-07-31 DIAGNOSIS — K661 Hemoperitoneum: Secondary | ICD-10-CM | POA: Diagnosis present

## 2012-07-31 DIAGNOSIS — I482 Chronic atrial fibrillation, unspecified: Secondary | ICD-10-CM

## 2012-07-31 DIAGNOSIS — Z951 Presence of aortocoronary bypass graft: Secondary | ICD-10-CM

## 2012-07-31 DIAGNOSIS — R269 Unspecified abnormalities of gait and mobility: Secondary | ICD-10-CM

## 2012-07-31 DIAGNOSIS — H9190 Unspecified hearing loss, unspecified ear: Secondary | ICD-10-CM

## 2012-07-31 DIAGNOSIS — I10 Essential (primary) hypertension: Secondary | ICD-10-CM | POA: Diagnosis present

## 2012-07-31 DIAGNOSIS — I719 Aortic aneurysm of unspecified site, without rupture: Secondary | ICD-10-CM

## 2012-07-31 DIAGNOSIS — I059 Rheumatic mitral valve disease, unspecified: Secondary | ICD-10-CM | POA: Diagnosis present

## 2012-07-31 DIAGNOSIS — K219 Gastro-esophageal reflux disease without esophagitis: Secondary | ICD-10-CM

## 2012-07-31 DIAGNOSIS — I5032 Chronic diastolic (congestive) heart failure: Secondary | ICD-10-CM

## 2012-07-31 DIAGNOSIS — J9600 Acute respiratory failure, unspecified whether with hypoxia or hypercapnia: Secondary | ICD-10-CM

## 2012-07-31 DIAGNOSIS — I5033 Acute on chronic diastolic (congestive) heart failure: Principal | ICD-10-CM

## 2012-07-31 DIAGNOSIS — C61 Malignant neoplasm of prostate: Secondary | ICD-10-CM

## 2012-07-31 DIAGNOSIS — I4891 Unspecified atrial fibrillation: Secondary | ICD-10-CM

## 2012-07-31 DIAGNOSIS — I739 Peripheral vascular disease, unspecified: Secondary | ICD-10-CM

## 2012-07-31 DIAGNOSIS — IMO0002 Reserved for concepts with insufficient information to code with codable children: Secondary | ICD-10-CM

## 2012-07-31 DIAGNOSIS — J4489 Other specified chronic obstructive pulmonary disease: Secondary | ICD-10-CM | POA: Diagnosis present

## 2012-07-31 DIAGNOSIS — Z87891 Personal history of nicotine dependence: Secondary | ICD-10-CM

## 2012-07-31 DIAGNOSIS — E119 Type 2 diabetes mellitus without complications: Secondary | ICD-10-CM | POA: Diagnosis present

## 2012-07-31 DIAGNOSIS — R109 Unspecified abdominal pain: Secondary | ICD-10-CM | POA: Diagnosis present

## 2012-07-31 DIAGNOSIS — Z8546 Personal history of malignant neoplasm of prostate: Secondary | ICD-10-CM

## 2012-07-31 DIAGNOSIS — Z0000 Encounter for general adult medical examination without abnormal findings: Secondary | ICD-10-CM

## 2012-07-31 DIAGNOSIS — J449 Chronic obstructive pulmonary disease, unspecified: Secondary | ICD-10-CM | POA: Diagnosis present

## 2012-07-31 LAB — CBC WITH DIFFERENTIAL/PLATELET
Basophils Absolute: 0 10*3/uL (ref 0.0–0.1)
Basophils Relative: 0 % (ref 0–1)
HCT: 34.5 % — ABNORMAL LOW (ref 39.0–52.0)
Hemoglobin: 10.8 g/dL — ABNORMAL LOW (ref 13.0–17.0)
Lymphocytes Relative: 4 % — ABNORMAL LOW (ref 12–46)
MCHC: 31.3 g/dL (ref 30.0–36.0)
Neutro Abs: 7.2 10*3/uL (ref 1.7–7.7)
Neutrophils Relative %: 80 % — ABNORMAL HIGH (ref 43–77)
RDW: 19.4 % — ABNORMAL HIGH (ref 11.5–15.5)
WBC: 9 10*3/uL (ref 4.0–10.5)

## 2012-07-31 LAB — COMPREHENSIVE METABOLIC PANEL
ALT: 19 U/L (ref 0–53)
AST: 21 U/L (ref 0–37)
Albumin: 3.4 g/dL — ABNORMAL LOW (ref 3.5–5.2)
Alkaline Phosphatase: 79 U/L (ref 39–117)
CO2: 32 mEq/L (ref 19–32)
Chloride: 96 mEq/L (ref 96–112)
GFR calc non Af Amer: 72 mL/min — ABNORMAL LOW (ref >90)
Potassium: 3.8 mEq/L (ref 3.5–5.1)
Total Bilirubin: 1.8 mg/dL — ABNORMAL HIGH (ref 0.3–1.2)

## 2012-07-31 MED ORDER — SODIUM CHLORIDE 0.9 % IJ SOLN
3.0000 mL | Freq: Two times a day (BID) | INTRAMUSCULAR | Status: DC
  Administered 2012-07-31 – 2012-08-02 (×4): 3 mL via INTRAVENOUS
  Administered 2012-08-02: 09:00:00 via INTRAVENOUS
  Administered 2012-08-03 (×2): 3 mL via INTRAVENOUS

## 2012-07-31 MED ORDER — OXYMETAZOLINE HCL 0.05 % NA SOLN: 1.0000 | Freq: Two times a day (BID) | NASAL | Status: DC | PRN

## 2012-07-31 MED ORDER — DILTIAZEM HCL 60 MG PO TABS
120.0000 mg | ORAL_TABLET | Freq: Four times a day (QID) | ORAL | Status: DC
  Administered 2012-07-31 – 2012-08-04 (×14): 120 mg via ORAL
  Filled 2012-07-31 (×14): qty 2

## 2012-07-31 MED ORDER — FUROSEMIDE 10 MG/ML IJ SOLN: 40.0000 mg | Freq: Once | INTRAMUSCULAR | Status: DC

## 2012-07-31 MED ORDER — FUROSEMIDE 10 MG/ML IJ SOLN
40.0000 mg | Freq: Two times a day (BID) | INTRAMUSCULAR | Status: DC
  Administered 2012-07-31 – 2012-08-02 (×4): 40 mg via INTRAVENOUS
  Filled 2012-07-31 (×4): qty 4

## 2012-07-31 MED ORDER — IPRATROPIUM BROMIDE 0.02 % IN SOLN: 500.0000 ug | Freq: Four times a day (QID) | RESPIRATORY_TRACT | Status: DC | PRN

## 2012-07-31 MED ORDER — ALBUTEROL SULFATE (5 MG/ML) 0.5% IN NEBU: 2.5000 mg | INHALATION_SOLUTION | RESPIRATORY_TRACT | Status: DC | PRN

## 2012-07-31 MED ORDER — ADULT MULTIVITAMIN W/MINERALS CH: 1.0000 | ORAL_TABLET | Freq: Every day | ORAL | Status: DC

## 2012-07-31 MED ORDER — FUROSEMIDE 10 MG/ML IJ SOLN: 40.0000 mg | Freq: Two times a day (BID) | INTRAMUSCULAR | Status: DC

## 2012-07-31 MED ORDER — IOHEXOL 300 MG/ML  SOLN
100.0000 mL | Freq: Once | INTRAMUSCULAR | Status: AC | PRN
  Administered 2012-07-31: 100 mL via INTRAVENOUS

## 2012-07-31 MED ORDER — MOMETASONE FURO-FORMOTEROL FUM 100-5 MCG/ACT IN AERO
2.0000 | INHALATION_SPRAY | Freq: Two times a day (BID) | RESPIRATORY_TRACT | Status: DC
  Administered 2012-07-31 – 2012-08-04 (×8): 2 via RESPIRATORY_TRACT
  Filled 2012-07-31: qty 8.8

## 2012-07-31 MED ORDER — ONDANSETRON HCL 4 MG/2ML IJ SOLN: 4.0000 mg | Freq: Four times a day (QID) | INTRAMUSCULAR | Status: DC | PRN

## 2012-07-31 MED ORDER — SIMVASTATIN 20 MG PO TABS: 20.0000 mg | ORAL_TABLET | Freq: Every day | ORAL | Status: DC

## 2012-07-31 MED ORDER — ATORVASTATIN CALCIUM 10 MG PO TABS
10.0000 mg | ORAL_TABLET | Freq: Every day | ORAL | Status: DC
  Administered 2012-07-31 – 2012-08-03 (×4): 10 mg via ORAL
  Filled 2012-07-31 (×4): qty 1

## 2012-07-31 MED ORDER — PANTOPRAZOLE SODIUM 40 MG PO TBEC: 40.0000 mg | DELAYED_RELEASE_TABLET | Freq: Every day | ORAL | Status: DC

## 2012-07-31 MED ORDER — PANTOPRAZOLE SODIUM 40 MG PO TBEC
80.0000 mg | DELAYED_RELEASE_TABLET | Freq: Every day | ORAL | Status: DC
  Administered 2012-08-01 – 2012-08-04 (×4): 80 mg via ORAL
  Filled 2012-07-31 (×4): qty 2

## 2012-07-31 MED ORDER — ADULT MULTIVITAMIN W/MINERALS CH
1.0000 | ORAL_TABLET | Freq: Every day | ORAL | Status: DC
  Administered 2012-08-01 – 2012-08-04 (×4): 1 via ORAL
  Filled 2012-07-31 (×4): qty 1

## 2012-07-31 MED ORDER — FUROSEMIDE 40 MG PO TABS: 60.0000 mg | ORAL_TABLET | Freq: Every day | ORAL | Status: DC

## 2012-07-31 MED ORDER — TAMSULOSIN HCL 0.4 MG PO CAPS
0.4000 mg | ORAL_CAPSULE | Freq: Every day | ORAL | Status: DC
  Administered 2012-08-01 – 2012-08-04 (×4): 0.4 mg via ORAL
  Filled 2012-07-31 (×5): qty 1

## 2012-07-31 MED ORDER — POTASSIUM CHLORIDE CRYS ER 20 MEQ PO TBCR
20.0000 meq | EXTENDED_RELEASE_TABLET | Freq: Two times a day (BID) | ORAL | Status: DC
  Administered 2012-07-31: 20 meq via ORAL
  Filled 2012-07-31 (×2): qty 1

## 2012-07-31 MED ORDER — IOHEXOL 300 MG/ML  SOLN
50.0000 mL | Freq: Once | INTRAMUSCULAR | Status: AC | PRN
  Administered 2012-07-31: 50 mL via ORAL

## 2012-07-31 MED ORDER — ONDANSETRON HCL 4 MG PO TABS: 4.0000 mg | ORAL_TABLET | Freq: Four times a day (QID) | ORAL | Status: DC | PRN

## 2012-07-31 MED ORDER — FUROSEMIDE 10 MG/ML IJ SOLN
60.0000 mg | Freq: Once | INTRAMUSCULAR | Status: AC
  Administered 2012-07-31: 60 mg via INTRAVENOUS
  Filled 2012-07-31: qty 6

## 2012-07-31 MED ORDER — POTASSIUM CHLORIDE CRYS ER 20 MEQ PO TBCR: 20.0000 meq | EXTENDED_RELEASE_TABLET | Freq: Two times a day (BID) | ORAL | Status: DC

## 2012-07-31 MED ORDER — OXYCODONE-ACETAMINOPHEN 5-325 MG PO TABS
1.0000 | ORAL_TABLET | ORAL | Status: DC | PRN
  Administered 2012-07-31 – 2012-08-02 (×2): 1 via ORAL
  Filled 2012-07-31 (×2): qty 1

## 2012-07-31 NOTE — ED Notes (Signed)
Brought from [facility], Increased edema , swelling, sob.

## 2012-07-31 NOTE — ED Provider Notes (Signed)
History     CSN: [id number]  Arrival date & time 07/31/12  1329   First MD Initiated Contact with Patient 07/31/12 1348      Chief Complaint  Patient presents with  . Shortness of Breath    (Consider location/radiation/quality/duration/timing/severity/associated sxs/prior treatment) Patient is a 79 y.o. male presenting with shortness of breath. The history is provided by a relative. The history is limited by the condition of the patient (dementia).  Shortness of Breath  Associated symptoms include shortness of breath.  He had recently been hospitalized for a retroperitoneal hematoma and while in the hospital had not been getting his Lasix. Lasix was restarted 4 days ago. Family has noted that he seems to be having difficulty breathing and has some swelling which they were he is an exacerbation of his congestive heart failure. He had been admitted to the hospital last month with congestive heart failure at which time his weight was 246 pounds and his weight was down to 226 pounds when discharged. Weight is back up to 246 pounds. Family does relate that he had transfusions of 3 or 4 units of blood while in hospital with his retroperitoneal hematoma. He also has an abdominal aneurysm which was not the source of the hematoma.  Past Medical History  Diagnosis Date  . Mitral valve prolapse 2003    MVR in 2003  . Obesity   . Osteoarthritis   . Hypertension   . Hyperlipidemia   . COPD (chronic obstructive pulmonary disease)   . CHF (congestive heart failure) diastolic 10/2010    CXR in 12/2010: Prior CABG, cardiomegaly, vascular redistribution, bibasilar atelectasis, small effusions  . Pneumonia   . Impaired glucose tolerance   . Atrial fibrillation, chronic   . Prostate cancer     elevated PSA  . GERD (gastroesophageal reflux disease)     + hiatal hernia  . Arteriosclerotic cardiovascular disease (ASCVD) 1994    stent to RCA; CABG-2003  . Hard of hearing   . Anemia, iron deficiency    . AAA (abdominal aortic aneurysm)     Fusiform; infrarenal; 4-4.1cm on CT in 07/2008 and 12/2010 by MRI  . Gastric ulcer 2004    2004; upper GI bleed  . Renal cysts, acquired, bilateral     Complex by MRI in 12/2010  . Adenomatous polyps 04/07/2012  . HYPERTENSION 12/17/2007    Subsequently hypotensive and all antihypertensive medication discontinued Lab  03/2012: Mild anemia with H&H-11.8/37.5, MCV-97, ferritin-224, normal CMet ex G-124, alb-3.4   . NSTEMI (non-ST elevated myocardial infarction) 01/18/2011  . Diabetes mellitus, type II 07/13/12    family denies patient is diabetic    Past Surgical History  Procedure Date  . Knee surgery     Left  . Bladder surgery   . Femoral artery stent 12-06-10    Left SFA  . Appendectomy   . Mitral valve replacement (mvr)/coronary artery bypass grafting (cabg) 2003    stent to RCA in 1994  . Eye surgery 2007    bilateral cataracts  . Tonsillectomy     thinks they were removed while in the [employer]  . Mass excision 06/07/2011    Procedure: EXCISION MASS;  Surgeon: Fabio Bering;  Location: [facility];  Service: General;  Laterality: N/A;  excision of 2 masses back and buttocks  . Back surgery   . Esophagogastroduodenoscopy 03/03/2003    Large, deep prepyloric ulcer, as described above without bleeding/ Normal esophagus  . Esophagogastroduodenoscopy 09/03/2004    normal throughout  .  Colonoscopy 09/03/2004    small ulcer, without stigmata of bleeding  . Esophagogastroduodenoscopy 11/14/2004    Normal esophagus and small hiatal hernia, otherwise normal stomach   . Colonoscopy 11/14/2004    Internal hemorrhoids, otherwise normal rectum/ left-sided diverticula , diffusely oozing right colon mucosa without a discrete lesion amenable to endoscopic therapy. 2 diminutive polyps. FELT TO HAVE AVMs/telangiectasias  . Colonoscopy 01/29/2005    Normal rectum  . Colonoscopy 03/11/2012    Colonic diverticulosis. Colonic polyps-removed as described above.  Vascular anomalies in the cecum likely representing hemangiomas. Status post hemostasis clipping of  2 of the 3. ADENOMATOUS POLYPS. Repeat 2016  . Esophagogastroduodenoscopy 03/11/2012    Deformity of the antrum;  small polyp in antrum-not manipulated. Otherwise normal exam    Family History  Problem Relation Age of Onset  . Stroke Mother   . Stroke Brother   . Heart attack Brother   . Cancer Brother   . Cancer Brother   . Anesthesia problems Neg Hx   . Hypotension Neg Hx   . Malignant hyperthermia Neg Hx   . Pseudochol deficiency Neg Hx     History  Substance Use Topics  . Smoking status: Former Smoker -- 1.0 packs/day for 70 years    Types: Cigarettes    Quit date: 06/25/2007  . Smokeless tobacco: Former Neurosurgeon  . Alcohol Use: No      Review of Systems  Unable to perform ROS: Dementia  Respiratory: Positive for shortness of breath.     Allergies  Gabapentin  Home Medications   Current Outpatient Rx  Name  Route  Sig  Dispense  Refill  . ALBUTEROL SULFATE (2.5 MG/3ML) 0.083% IN NEBU   Nebulization   Take 3 mLs (2.5 mg total) by nebulization every 6 (six) hours as needed. Mixes with the Ipratropium(for shortness of breath)   300 mL   1   . DILTIAZEM HCL 120 MG PO TABS   Oral   Take 1 tablet (120 mg total) by mouth 4 (four) times daily.   30 tablet   0   . FERUMOXYTOL INJECTION 510 MG/17 ML   Intravenous   Inject 1,020 mg into the vein once. If iron levels deem appropriate         . FLUTICASONE-SALMETEROL 100-50 MCG/DOSE IN AEPB   Inhalation   Inhale 1 puff into the lungs every 12 (twelve) hours.         . FUROSEMIDE 20 MG PO TABS   Oral   Take 60 mg by mouth daily.         . GUAIFENESIN ER 600 MG PO TB12   Oral   Take 600 mg by mouth 2 (two) times daily as needed. congestion         . IPRATROPIUM BROMIDE 0.02 % IN SOLN   Nebulization   Take 500 mcg by nebulization every 6 (six) hours as needed. Shortness of breath         . LOVASTATIN  40 MG PO TABS   Oral   Take 40 mg by mouth at bedtime. Only takes on Sun, Mon, Wed, and Fri.         . ADULT MULTIVITAMIN W/MINERALS CH   Oral   Take 1 tablet by mouth daily.         Marland Kitchen OMEPRAZOLE 40 MG PO CPDR   Oral   Take 40 mg by mouth daily.         . OXYCODONE-ACETAMINOPHEN 5-325 MG PO TABS  Oral   Take 1 tablet by mouth every 4 (four) hours as needed for pain.   30 tablet   0   . OXYMETAZOLINE HCL 0.05 % NA SOLN   Nasal   Place 1 spray into the nose daily as needed. Congestion         . POTASSIUM CHLORIDE CRYS ER 20 MEQ PO TBCR   Oral   Take 20 mEq by mouth 2 (two) times daily.         Marland Kitchen TAMSULOSIN HCL 0.4 MG PO CAPS   Oral   Take 1 capsule (0.4 mg total) by mouth daily.   30 capsule   0     BP 118/62  Pulse 76  Temp 97.7 F (36.5 C) (Oral)  Resp 26  Wt 246 lb (111.585 kg)  SpO2 96%  Physical Exam  Nursing note and vitals reviewed.  79 year old male, who appears pale and mildly dyspneic but his in no acute distress. Vital signs are significant for tachypnea with respiratory rate of 26. Oxygen saturation is 996%, which is normal. Head is normocephalic and atraumatic. PERRLA, EOMI. Oropharynx is clear. Neck is nontender and supple without adenopathy or JVD. Back is nontender and there is no CVA tenderness. Extensive ecchymosis is present over the left side of the lower back. Lungs have bibasilar rales without wheezes or rhonchi. Chest is nontender. Heart has regular rate and rhythm without murmur. Abdomen is soft, flat, nontender without masses or hepatosplenomegaly and peristalsis is normoactive. Extremities have 2+ edema with 1+ presacral edema. Skin is warm and dry without rash. Neurologic: He is awake and alert but nonverbal, cranial nerves are intact, there are no gross motor or sensory deficits.  ED Course  Procedures (including critical care time)  Results for orders placed during the hospital encounter of 07/31/12  CBC WITH  DIFFERENTIAL      Component Value Range   WBC 9.0  4.0 - 10.5 K/uL   RBC 3.44 (*) 4.22 - 5.81 MIL/uL   Hemoglobin 10.8 (*) 13.0 - 17.0 g/dL   HCT 16.1 (*) 09.6 - 04.5 %   MCV 100.3 (*) 78.0 - 100.0 fL   MCH 31.4  26.0 - 34.0 pg   MCHC 31.3  30.0 - 36.0 g/dL   RDW 40.9 (*) 81.1 - 91.4 %   Platelets 178  150 - 400 K/uL   Neutrophils Relative 80 (*) 43 - 77 %   Neutro Abs 7.2  1.7 - 7.7 K/uL   Lymphocytes Relative 4 (*) 12 - 46 %   Lymphs Abs 0.4 (*) 0.7 - 4.0 K/uL   Monocytes Relative 14 (*) 3 - 12 %   Monocytes Absolute 1.3 (*) 0.1 - 1.0 K/uL   Eosinophils Relative 1  0 - 5 %   Eosinophils Absolute 0.1  0.0 - 0.7 K/uL   Basophils Relative 0  0 - 1 %   Basophils Absolute 0.0  0.0 - 0.1 K/uL  COMPREHENSIVE METABOLIC PANEL      Component Value Range   Sodium 137  135 - 145 mEq/L   Potassium 3.8  3.5 - 5.1 mEq/L   Chloride 96  96 - 112 mEq/L   CO2 32  19 - 32 mEq/L   Glucose, Bld 116 (*) 70 - 99 mg/dL   BUN 16  6 - 23 mg/dL   Creatinine, Ser 7.82  0.50 - 1.35 mg/dL   Calcium 8.6  8.4 - 95.6 mg/dL   Total Protein 6.7  6.0 -  8.3 g/dL   Albumin 3.4 (*) 3.5 - 5.2 g/dL   AST 21  0 - 37 U/L   ALT 19  0 - 53 U/L   Alkaline Phosphatase 79  39 - 117 U/L   Total Bilirubin 1.8 (*) 0.3 - 1.2 mg/dL   GFR calc non Af Amer 72 (*) >90 mL/min   GFR calc Af Amer 84 (*) >90 mL/min  PRO B NATRIURETIC PEPTIDE      Component Value Range   Pro B Natriuretic peptide (BNP) 3377.0 (*) 0 - 450 pg/mL  TROPONIN I      Component Value Range   Troponin I <0.30  <0.30 ng/mL   Dg Chest Portable 1 View  07/31/2012  *RADIOLOGY REPORT*  Clinical Data: Shortness of breath.  PORTABLE CHEST - 1 VIEW  Comparison: 07/23/2012  Findings: Mild congestive heart failure and small bilateral pleural effusions are suspected.  Heart is moderately enlarged.  IMPRESSION: Mild CHF with small bilateral pleural effusions.   Original Report Authenticated By: Irish Lack, M.D.     Image viewed by me.   Date: 07/31/2012   Rate: 78  Rhythm: atrial fibrillation and premature ventricular contractions (PVC)  QRS Axis: normal  Intervals: normal  ST/T Wave abnormalities: normal  Conduction Disutrbances:left bundle branch block  Narrative Interpretation: Atrial fibrillation with PVCs, left bundle-branch block. When compared with ECG of 07/13/2012, heart rate is slower and PVCs are now present.  Old EKG Reviewed: unchanged   1. CHF exacerbation   2. Anemia       MDM  CHF exacerbation. Old records are reviewed and he was discharged on February 4 following the spontaneous retroperitoneal hematoma. CT scan had shown that his aneurysm was intact. Workup has been initiated into clipping laboratory testing, ECG, chest x-ray and he will clearly need additional furosemide.  X-ray shows evidence of CHF with pleural effusions. BNP has increased over baseline. Anemia is stable and with actual improvement over the last hemoglobin. Case is discussed with Dr. Karilyn Cota of [facility] who agrees to admit the patient.      Dione Booze, MD 07/31/12 [phone]

## 2012-07-31 NOTE — H&P (Signed)
[facility] History and Physical  [name] ZOX:[id number] DOB: 1921/07/16 DOA: 07/31/2012  Referring physician: Dr. Preston Fleeting, ER physician. PCP: [name], MD    Chief Complaint: Abdominal swelling and pain, weight gain.  HPI: [name] is a 79 y.o. male presents with symptoms of abdominal pain, swelling and weight gain for the last week or so. He was recently hospitalized for a retroperitoneal bleed, which did not require surgery. He did require 4 units of blood transfusion in [facility]. He was discharged only 3 days ago from the hospital. The main symptom he gave me was abdominal pain and then also swelling of his abdomen. He denies any increasing dyspnea. There is no chest pain. He does have a history of atrial fibrillation, not a candidate for anticoagulation.   Review of Systems: Apart from history of present illness, other systems negative.  Past Medical History  Diagnosis Date  . Mitral valve prolapse 2003    MVR in 2003  . Obesity   . Osteoarthritis   . Hypertension   . Hyperlipidemia   . COPD (chronic obstructive pulmonary disease)   . CHF (congestive heart failure) diastolic 10/2010    CXR in 12/2010: Prior CABG, cardiomegaly, vascular redistribution, bibasilar atelectasis, small effusions  . Pneumonia   . Impaired glucose tolerance   . Atrial fibrillation, chronic   . Prostate cancer     elevated PSA  . GERD (gastroesophageal reflux disease)     + hiatal hernia  . Arteriosclerotic cardiovascular disease (ASCVD) 1994    stent to RCA; CABG-2003  . Hard of hearing   . Anemia, iron deficiency   . AAA (abdominal aortic aneurysm)     Fusiform; infrarenal; 4-4.1cm on CT in 07/2008 and 12/2010 by MRI  . Gastric ulcer 2004    2004; upper GI bleed  . Renal cysts, acquired, bilateral     Complex by MRI in 12/2010  . Adenomatous polyps 04/07/2012  . HYPERTENSION 12/17/2007    Subsequently hypotensive and all antihypertensive medication  discontinued Lab  03/2012: Mild anemia with H&H-11.8/37.5, MCV-97, ferritin-224, normal CMet ex G-124, alb-3.4   . NSTEMI (non-ST elevated myocardial infarction) 01/18/2011  . Diabetes mellitus, type II 07/13/12    family denies patient is diabetic   Past Surgical History  Procedure Date  . Knee surgery     Left  . Bladder surgery   . Femoral artery stent 12-06-10    Left SFA  . Appendectomy   . Mitral valve replacement (mvr)/coronary artery bypass grafting (cabg) 2003    stent to RCA in 1994  . Eye surgery 2007    bilateral cataracts  . Tonsillectomy     thinks they were removed while in the [employer]  . Mass excision 06/07/2011    Procedure: EXCISION MASS;  Surgeon: Fabio Bering;  Location: [facility];  Service: General;  Laterality: N/A;  excision of 2 masses back and buttocks  . Back surgery   . Esophagogastroduodenoscopy 03/03/2003    Large, deep prepyloric ulcer, as described above without bleeding/ Normal esophagus  . Esophagogastroduodenoscopy 09/03/2004    normal throughout  . Colonoscopy 09/03/2004    small ulcer, without stigmata of bleeding  . Esophagogastroduodenoscopy 11/14/2004    Normal esophagus and small hiatal hernia, otherwise normal stomach   . Colonoscopy 11/14/2004    Internal hemorrhoids, otherwise normal rectum/ left-sided diverticula , diffusely oozing right colon mucosa without a discrete lesion amenable to endoscopic therapy. 2 diminutive polyps. FELT TO HAVE AVMs/telangiectasias  .  Colonoscopy 01/29/2005    Normal rectum  . Colonoscopy 03/11/2012    Colonic diverticulosis. Colonic polyps-removed as described above. Vascular anomalies in the cecum likely representing hemangiomas. Status post hemostasis clipping of  2 of the 3. ADENOMATOUS POLYPS. Repeat 2016  . Esophagogastroduodenoscopy 03/11/2012    Deformity of the antrum;  small polyp in antrum-not manipulated. Otherwise normal exam   Social History:  reports that he quit smoking about 5 years ago. His  smoking use included Cigarettes. He has a 70 pack-year smoking history. He has quit using smokeless tobacco. He reports that he does not drink alcohol or use illicit drugs.   Allergies  Allergen Reactions  . Gabapentin     Patient cannot take over 200 mg dose at a time as it causes jerking or spasms    Family History  Problem Relation Age of Onset  . Stroke Mother   . Stroke Brother   . Heart attack Brother   . Cancer Brother   . Cancer Brother   . Anesthesia problems Neg Hx   . Hypotension Neg Hx   . Malignant hyperthermia Neg Hx   . Pseudochol deficiency Neg Hx       Prior to Admission medications   Medication Sig Start Date End Date Taking? Authorizing Provider  albuterol (PROVENTIL) (2.5 MG/3ML) 0.083% nebulizer solution Take 3 mLs (2.5 mg total) by nebulization every 6 (six) hours as needed. Mixes with the Ipratropium(for shortness of breath) 07/02/12  Yes Kerri Perches, MD  diltiazem (CARDIZEM) 120 MG tablet Take 1 tablet (120 mg total) by mouth 4 (four) times daily. 07/15/12  Yes Christiane Ha, MD  Fluticasone-Salmeterol (ADVAIR) 100-50 MCG/DOSE AEPB Inhale 1 puff into the lungs every 12 (twelve) hours.   Yes Historical Provider, MD  furosemide (LASIX) 20 MG tablet Take 60 mg by mouth daily.   Yes Historical Provider, MD  guaiFENesin (MUCINEX) 600 MG 12 hr tablet Take 600 mg by mouth 2 (two) times daily as needed. congestion   Yes Historical Provider, MD  ipratropium (ATROVENT) 0.02 % nebulizer solution Take 500 mcg by nebulization every 6 (six) hours as needed. Shortness of breath   Yes Historical Provider, MD  lovastatin (MEVACOR) 40 MG tablet Take 40 mg by mouth at bedtime. Only takes on Sun, Mon, Wed, and Fri.   Yes Historical Provider, MD  Multiple Vitamin (MULTIVITAMIN WITH MINERALS) TABS Take 1 tablet by mouth daily.   Yes Historical Provider, MD  nystatin (MYCOSTATIN) 100000 UNIT/ML suspension Take 500,000 Units by mouth 4 (four) times daily.   Yes Historical  Provider, MD  omeprazole (PRILOSEC) 40 MG capsule Take 40 mg by mouth daily.   Yes Historical Provider, MD  oxyCODONE-acetaminophen (ROXICET) 5-325 MG per tablet Take 1 tablet by mouth every 4 (four) hours as needed for pain. 07/28/12  Yes Lars Mage, PA  potassium chloride SA (K-DUR,KLOR-CON) 20 MEQ tablet Take 20 mEq by mouth 2 (two) times daily.   Yes Historical Provider, MD  Tamsulosin HCl (FLOMAX) 0.4 MG CAPS Take 1 capsule (0.4 mg total) by mouth daily. 07/15/12  Yes Christiane Ha, MD  oxymetazoline (AFRIN) 0.05 % nasal spray Place 1 spray into the nose daily as needed. Congestion    Historical Provider, MD   Physical Exam: Filed Vitals:   07/31/12 1355 07/31/12 1358 07/31/12 1519  BP: 118/62  94/54  Pulse: 76  82  Temp: 97.7 F (36.5 C)    TempSrc: Oral    Resp:  26 17  Weight: 111.585 kg (246 lb)    SpO2: 96%  96%     General:  He looks chronically sick. He is obese. He is not toxic or septic.  Eyes: No pallor. No jaundice.  ENT: No abnormalities.  Neck: No lymphadenopathy.  Cardiovascular: Heart sounds are in atrial fibrillation clinically. Jugular venous pressure is not elevated. He does have small degree of pitting edema in his legs, lower.  Respiratory: Lung fields are clear.  Abdomen: Distended. Tender superficially on minimal palpation.  Skin: No rash.  Musculoskeletal: No major joint deformity.  Psychiatric: Appropriate affect.  Neurologic: Alert and orientated, no focal neurological signs.  Labs on Admission:  Basic Metabolic Panel:  Lab 07/31/12 4540 07/28/12 0520 07/25/12 0630  NA 137 136 135  K 3.8 4.0 3.8  CL 96 99 100  CO2 32 30 28  GLUCOSE 116* 116* 107*  BUN 16 18 19   CREATININE 0.94 0.96 0.89  CALCIUM 8.6 8.1* 8.0*  MG -- -- --  PHOS -- -- --   Liver Function Tests:  Lab 07/31/12 1357  AST 21  ALT 19  ALKPHOS 79  BILITOT 1.8*  PROT 6.7  ALBUMIN 3.4*     CBC:  Lab 07/31/12 1357 07/28/12 0520 07/27/12 0700 07/26/12  0935 07/24/12 2105  WBC 9.0 12.4* 13.5* 15.0* --  NEUTROABS 7.2 -- -- -- --  HGB 10.8* 9.8* 9.7* 9.6* 9.2*  HCT 34.5* 30.7* 30.0* 29.9* 27.8*  MCV 100.3* 96.8 97.4 96.1 --  PLT 178 162 173 159 --   Cardiac Enzymes:  Lab 07/31/12 1357  CKTOTAL --  CKMB --  CKMBINDEX --  TROPONINI <0.30    BNP (last 3 results)  Basename 07/31/12 1357 07/12/12 2033 08/06/11 1030  PROBNP 3377.0* 1668.0* 2781.0*   CBG:  Lab 07/28/12 1108 07/28/12 0645 07/27/12 2118 07/27/12 1813 07/27/12 1120  GLUCAP 133* 104* 120* 186* 103*    Radiological Exams on Admission: Dg Chest Portable 1 View  07/31/2012  *RADIOLOGY REPORT*  Clinical Data: Shortness of breath.  PORTABLE CHEST - 1 VIEW  Comparison: 07/23/2012  Findings: Mild congestive heart failure and small bilateral pleural effusions are suspected.  Heart is moderately enlarged.  IMPRESSION: Mild CHF with small bilateral pleural effusions.   Original Report Authenticated By: Irish Lack, M.D.     EKG: Independently reviewed. Atrial fibrillation, no acute ST elevation.  Assessment/Plan   1. Diastolic congestive heart failure, acute on chronic. 2. Abdominal pain, unclear etiology,? Increasing retroperitoneal hematoma. 3. Atrial fibrillation, chronic, rate control. 4. Obesity.  Plan: 1. Admit to telemetry. 2. CT scan of the abdomen and pelvis. 3. Intravenous diuretics. Further recommendations will depend on patient's hospital progress and results of the CT scan of his abdomen.   Code Status: Full code.  Family Communication: Discussed plan with patient and patient's daughter at the bedside.   Disposition Plan: Depending on investigations above in progress.   Time spent: 45 minutes.  Wilson Singer [facility] Pager [phone]  If 7PM-7AM, please contact night-coverage [email or web address] Password [facility] 07/31/2012, 3:30 PM

## 2012-08-01 ENCOUNTER — Inpatient Hospital Stay (HOSPITAL_COMMUNITY): Payer: Medicare Other

## 2012-08-01 DIAGNOSIS — E669 Obesity, unspecified: Secondary | ICD-10-CM

## 2012-08-01 LAB — CBC
HCT: 34.9 % — ABNORMAL LOW (ref 39.0–52.0)
MCHC: 30.9 g/dL (ref 30.0–36.0)
Platelets: 183 10*3/uL (ref 150–400)
RDW: 19.6 % — ABNORMAL HIGH (ref 11.5–15.5)
WBC: 7 10*3/uL (ref 4.0–10.5)

## 2012-08-01 LAB — COMPREHENSIVE METABOLIC PANEL
ALT: 17 U/L (ref 0–53)
Alkaline Phosphatase: 78 U/L (ref 39–117)
BUN: 15 mg/dL (ref 6–23)
CO2: 37 mEq/L — ABNORMAL HIGH (ref 19–32)
Chloride: 95 mEq/L — ABNORMAL LOW (ref 96–112)
GFR calc Af Amer: 83 mL/min — ABNORMAL LOW (ref >90)
GFR calc non Af Amer: 72 mL/min — ABNORMAL LOW (ref >90)
Glucose, Bld: 107 mg/dL — ABNORMAL HIGH (ref 70–99)
Potassium: 3.4 mEq/L — ABNORMAL LOW (ref 3.5–5.1)
Sodium: 138 mEq/L (ref 135–145)
Total Bilirubin: 1.6 mg/dL — ABNORMAL HIGH (ref 0.3–1.2)

## 2012-08-01 MED ORDER — POTASSIUM CHLORIDE CRYS ER 20 MEQ PO TBCR
40.0000 meq | EXTENDED_RELEASE_TABLET | Freq: Two times a day (BID) | ORAL | Status: DC
  Administered 2012-08-01 – 2012-08-03 (×5): 40 meq via ORAL
  Filled 2012-08-01 (×6): qty 2

## 2012-08-01 NOTE — Progress Notes (Signed)
Subjective: He feels better today, appears to be less swollen according to him. I do not have a weight for him this morning. CT scan of the abdomen yesterday did not show anything worsening.           Physical Exam: Blood pressure 113/58, pulse 79, temperature 97.9 F (36.6 C), temperature source Oral, resp. rate 20, height 6\' 2"  (1.88 m), weight 111.585 kg (246 lb), SpO2 96.00%. He looks chronically sick. He has itching edema in his lower legs. His abdomen appears to be still somewhat swollen but appears to be less tender than yesterday. Heart sounds are present and in atrial fibrillation. Jugular venous pressure not elevated. Lung fields are clear. He is alert and oriented .   Investigations:  Recent Results (from the past 240 hour(s))  MRSA PCR SCREENING     Status: None   Collection Time    07/31/12 11:25 PM      Result Value Range Status   MRSA by PCR NEGATIVE  NEGATIVE Final   Comment:            The GeneXpert MRSA Assay (FDA     approved for NASAL specimens     only), is one component of a     comprehensive MRSA colonization     surveillance program. It is not     intended to diagnose MRSA     infection nor to guide or     monitor treatment for     MRSA infections.     Basic Metabolic Panel:  Recent Labs  16/10/96 1357 08/01/12 0544  NA 137 138  K 3.8 3.4*  CL 96 95*  CO2 32 37*  GLUCOSE 116* 107*  BUN 16 15  CREATININE 0.94 0.97  CALCIUM 8.6 8.6   Liver Function Tests:  Recent Labs  07/31/12 1357 08/01/12 0544  AST 21 21  ALT 19 17  ALKPHOS 79 78  BILITOT 1.8* 1.6*  PROT 6.7 6.5  ALBUMIN 3.4* 3.4*     CBC:  Recent Labs  07/31/12 1357 08/01/12 0544  WBC 9.0 7.0  NEUTROABS 7.2  --   HGB 10.8* 10.8*  HCT 34.5* 34.9*  MCV 100.3* 100.6*  PLT 178 183    Ct Abdomen Pelvis W Contrast  07/31/2012  *RADIOLOGY REPORT*  Clinical Data: Abdominal pain.  Recent aneurysm surgery and retroperitoneal hematoma.  CT ABDOMEN AND PELVIS WITH  CONTRAST  Technique:  Multidetector CT imaging of the abdomen and pelvis was performed following the standard protocol during bolus administration of intravenous contrast.  Contrast: 50mL OMNIPAQUE IOHEXOL 300 MG/ML  SOLN, OMNIPAQUE IOHEXOL 300 MG/ML  SOLN 11/31/2014  Comparison: Multiple exams, including 07/24/2012  Findings: Small left and moderate right pleural effusions with associated passive atelectasis noted.  There is evidence of old granulomatous disease of the lung bases, four-chamber cardiomegaly, and mitral valve calcification.  Atherosclerosis noted.  Left upper quadrant cystic lesion unchanged.  Bilateral renal cysts, unchanged.  Dependent density in the gallbladder may represent stones.  No specific hepatic abnormality observed.  Hematoma inseparable from the psoas muscle noted tracking along the posterior renal fascia, with mixed density.  Size not appreciably changed.  This tracks down into the upper part of the pelvis.  Stable infrarenal abdominal aortic aneurysm.  Stable atherosclerosis.  No new hemorrhage observed.  Urinary bladder unremarkable.  Levoconvex lumbar scoliosis noted with rotary component.  Umbilical hernia contains adipose tissue, stable.  No dilated bowel observed.  Lumbar spondylosis results in  multilevel foraminal impingement.  IMPRESSION:  1.  Overall stable size and appearance of left retroperitoneal hematoma. 2.  Small left and moderate right pleural effusions with passive atelectasis. 3.  Four-chamber cardiomegaly. 4.  Atherosclerosis. 5.  Bilateral renal cystic lesions, unchanged. 6.  Stable infrarenal abdominal aortic aneurysm. 7.  Small umbilical hernia contains adipose tissue. 8.  Lumbar spondylosis with multilevel chronic foraminal impingement.   Original Report Authenticated By: Gaylyn Rong, M.D.    Dg Chest Portable 1 View  07/31/2012  *RADIOLOGY REPORT*  Clinical Data: Shortness of breath.  PORTABLE CHEST - 1 VIEW  Comparison: 07/23/2012  Findings: Mild  congestive heart failure and small bilateral pleural effusions are suspected.  Heart is moderately enlarged.  IMPRESSION: Mild CHF with small bilateral pleural effusions.   Original Report Authenticated By: Irish Lack, M.D.       Medications: I have reviewed the patient's current medications.  Impression: 1. Acute on chronic diastolic congestive heart failure, clinically improving. 2. Abdominal pain with persistent retroperitoneal hematoma, not increasing in size. 3. Atrial fibrillation, chronic, rate controlled. 4. Obesity. 5. Abdominal aortic aneurysm, stable. 6. Hypokalemia.     Plan: 1. Continue with intravenous diuretics. 2. Replete potassium.     LOS: 1 day   Wilson Singer Pager [phone]  08/01/2012, 10:03 AM

## 2012-08-02 LAB — BASIC METABOLIC PANEL
Calcium: 8.5 mg/dL (ref 8.4–10.5)
GFR calc Af Amer: 67 mL/min — ABNORMAL LOW (ref >90)
GFR calc non Af Amer: 58 mL/min — ABNORMAL LOW (ref >90)
Glucose, Bld: 114 mg/dL — ABNORMAL HIGH (ref 70–99)
Potassium: 3.5 mEq/L (ref 3.5–5.1)
Sodium: 138 mEq/L (ref 135–145)

## 2012-08-02 MED ORDER — FUROSEMIDE 10 MG/ML IJ SOLN
80.0000 mg | Freq: Two times a day (BID) | INTRAMUSCULAR | Status: DC
  Administered 2012-08-02 – 2012-08-03 (×2): 80 mg via INTRAVENOUS
  Filled 2012-08-02 (×2): qty 8

## 2012-08-02 NOTE — Progress Notes (Deleted)
Pharmacist Heart Failure Core Measure Documentation  Assessment: [name] has an EF documented as 55-60% on 08/06/2011 by ECHO.  Rationale: Heart failure patients with left ventricular systolic dysfunction (LVSD) and an EF < 40% should be prescribed an angiotensin converting enzyme inhibitor (ACEI) or angiotensin receptor blocker (ARB) at discharge unless a contraindication is documented in the medical record.  This patient is not currently on an ACEI or ARB for HF.  This note is being placed in the record in order to provide documentation that a contraindication to the use of these agents is present for this encounter.  ACE Inhibitor or Angiotensin Receptor Blocker is contraindicated (specify all that apply)  []   ACEI allergy AND ARB allergy []   Angioedema []   Moderate or severe aortic stenosis []   Hyperkalemia [x]   Hypotension []   Renal artery stenosis []   Worsening renal function, preexisting renal disease or dysfunction   Elson Clan 08/02/2012 12:52 PM

## 2012-08-02 NOTE — Progress Notes (Signed)
Subjective: He feels better today, appears to be less swollen according to him. Unfortunately, his weight is not significantly decreased.. Also, he has not had any significant diuresis.           Physical Exam: Blood pressure 111/57, pulse 87, temperature 97.2 F (36.2 C), temperature source Oral, resp. rate 20, height 6\' 2"  (1.88 m), weight 112.5 kg (248 lb 0.3 oz), SpO2 91.00%. He looks chronically sick. He has itching edema in his lower legs. His abdomen appears to be still somewhat swollen but appears to be less tender than yesterday. Heart sounds are present and in atrial fibrillation. Jugular venous pressure not elevated. Lung fields are clear. He is alert and oriented .   Investigations:  Recent Results (from the past 240 hour(s))  MRSA PCR SCREENING     Status: None   Collection Time    07/31/12 11:25 PM      Result Value Range Status   MRSA by PCR NEGATIVE  NEGATIVE Final   Comment:            The GeneXpert MRSA Assay (FDA     approved for NASAL specimens     only), is one component of a     comprehensive MRSA colonization     surveillance program. It is not     intended to diagnose MRSA     infection nor to guide or     monitor treatment for     MRSA infections.     Basic Metabolic Panel:  Recent Labs  16/10/96 0544 08/02/12 0638  NA 138 138  K 3.4* 3.5  CL 95* 95*  CO2 37* 38*  GLUCOSE 107* 114*  BUN 15 17  CREATININE 0.97 1.10  CALCIUM 8.6 8.5   Liver Function Tests:  Recent Labs  07/31/12 1357 08/01/12 0544  AST 21 21  ALT 19 17  ALKPHOS 79 78  BILITOT 1.8* 1.6*  PROT 6.7 6.5  ALBUMIN 3.4* 3.4*     CBC:  Recent Labs  07/31/12 1357 08/01/12 0544  WBC 9.0 7.0  NEUTROABS 7.2  --   HGB 10.8* 10.8*  HCT 34.5* 34.9*  MCV 100.3* 100.6*  PLT 178 183    Ct Abdomen Pelvis W Contrast  07/31/2012  *RADIOLOGY REPORT*  Clinical Data: Abdominal pain.  Recent aneurysm surgery and retroperitoneal hematoma.  CT ABDOMEN AND PELVIS  WITH CONTRAST  Technique:  Multidetector CT imaging of the abdomen and pelvis was performed following the standard protocol during bolus administration of intravenous contrast.  Contrast: 50mL OMNIPAQUE IOHEXOL 300 MG/ML  SOLN, OMNIPAQUE IOHEXOL 300 MG/ML  SOLN 11/31/2014  Comparison: Multiple exams, including 07/24/2012  Findings: Small left and moderate right pleural effusions with associated passive atelectasis noted.  There is evidence of old granulomatous disease of the lung bases, four-chamber cardiomegaly, and mitral valve calcification.  Atherosclerosis noted.  Left upper quadrant cystic lesion unchanged.  Bilateral renal cysts, unchanged.  Dependent density in the gallbladder may represent stones.  No specific hepatic abnormality observed.  Hematoma inseparable from the psoas muscle noted tracking along the posterior renal fascia, with mixed density.  Size not appreciably changed.  This tracks down into the upper part of the pelvis.  Stable infrarenal abdominal aortic aneurysm.  Stable atherosclerosis.  No new hemorrhage observed.  Urinary bladder unremarkable.  Levoconvex lumbar scoliosis noted with rotary component.  Umbilical hernia contains adipose tissue, stable.  No dilated bowel observed.  Lumbar spondylosis results in multilevel foraminal impingement.  IMPRESSION:  1.  Overall stable size and appearance of left retroperitoneal hematoma. 2.  Small left and moderate right pleural effusions with passive atelectasis. 3.  Four-chamber cardiomegaly. 4.  Atherosclerosis. 5.  Bilateral renal cystic lesions, unchanged. 6.  Stable infrarenal abdominal aortic aneurysm. 7.  Small umbilical hernia contains adipose tissue. 8.  Lumbar spondylosis with multilevel chronic foraminal impingement.   Original Report Authenticated By: Gaylyn Rong, M.D.    Dg Chest Port 1 View  08/01/2012  *RADIOLOGY REPORT*  Clinical Data: 79 year old male congestive heart failure, shortness of breath.  PORTABLE CHEST - 1  VIEW  Comparison: 07/31/2012 and earlier.  Findings: Portable upright AP view 1116 hours. Stable cardiomegaly and mediastinal contours.  Interval decreased bilateral pleural effusions and improved bibasilar ventilation.  Stable vascularity, no overt edema.  No pneumothorax.  Retrocardiac density is stable, favor related to chronic atelectasis.  IMPRESSION: Improved bibasilar ventilation with decreased pleural effusions. No acute edema.  Chronic retrocardiac atelectasis.   Original Report Authenticated By: Erskine Speed, M.D.    Dg Chest Portable 1 View  07/31/2012  *RADIOLOGY REPORT*  Clinical Data: Shortness of breath.  PORTABLE CHEST - 1 VIEW  Comparison: 07/23/2012  Findings: Mild congestive heart failure and small bilateral pleural effusions are suspected.  Heart is moderately enlarged.  IMPRESSION: Mild CHF with small bilateral pleural effusions.   Original Report Authenticated By: Irish Lack, M.D.       Medications: I have reviewed the patient's current medications.  Impression: 1. Acute on chronic diastolic congestive heart failure, slow to improve. 2. Abdominal pain with persistent retroperitoneal hematoma, not increasing in size. 3. Atrial fibrillation, chronic, rate controlled. 4. Obesity. 5. Abdominal aortic aneurysm, stable.      Plan: 1. Increase intravenous Lasix to 80 mg twice a day. 2. Follow weights daily closely..     LOS: 2 days   Wilson Singer Pager [phone]  08/02/2012, 10:46 AM

## 2012-08-03 DIAGNOSIS — J9610 Chronic respiratory failure, unspecified whether with hypoxia or hypercapnia: Secondary | ICD-10-CM

## 2012-08-03 DIAGNOSIS — D649 Anemia, unspecified: Secondary | ICD-10-CM

## 2012-08-03 DIAGNOSIS — I5033 Acute on chronic diastolic (congestive) heart failure: Principal | ICD-10-CM

## 2012-08-03 LAB — BASIC METABOLIC PANEL
BUN: 17 mg/dL (ref 6–23)
Chloride: 93 mEq/L — ABNORMAL LOW (ref 96–112)
Creatinine, Ser: 1.04 mg/dL (ref 0.50–1.35)
GFR calc Af Amer: 72 mL/min — ABNORMAL LOW (ref >90)
GFR calc non Af Amer: 62 mL/min — ABNORMAL LOW (ref >90)
Potassium: 4 mEq/L (ref 3.5–5.1)

## 2012-08-03 LAB — PRO B NATRIURETIC PEPTIDE: Pro B Natriuretic peptide (BNP): 2605 pg/mL — ABNORMAL HIGH (ref 0–450)

## 2012-08-03 MED ORDER — METOLAZONE 5 MG PO TABS
5.0000 mg | ORAL_TABLET | Freq: Every day | ORAL | Status: DC
  Administered 2012-08-03 – 2012-08-04 (×2): 5 mg via ORAL
  Filled 2012-08-03 (×2): qty 1

## 2012-08-03 MED ORDER — FUROSEMIDE 10 MG/ML IJ SOLN
120.0000 mg | Freq: Two times a day (BID) | INTRAVENOUS | Status: DC
  Administered 2012-08-03 – 2012-08-04 (×2): 120 mg via INTRAVENOUS
  Filled 2012-08-03 (×4): qty 12
  Filled 2012-08-03: qty 10

## 2012-08-03 NOTE — Plan of Care (Signed)
Problem: Phase I Progression Outcomes Goal: OOB as tolerated unless otherwise ordered Outcome: Progressing Pt up ambulating in room with walker with minimal assist Goal: Initial discharge plan identified Outcome: Progressing Plan to d/c to snf

## 2012-08-03 NOTE — Clinical Social Work Psychosocial (Signed)
Clinical Social Work Department BRIEF PSYCHOSOCIAL ASSESSMENT 08/03/2012  Patient:  [name], [name]     Account Number:  [id number]     Admit date:  07/31/2012  Clinical Social Worker:  Nancie Neas  Date/Time:  08/03/2012 11:45 AM  Referred by:  Physician  Date Referred:  08/03/2012 Referred for  SNF Placement   Other Referral:   Interview type:  Patient Other interview type:   and daughter- [name]    PSYCHOSOCIAL DATA Living Status:  FACILITY Admitted from facility:  [facility] Level of care:  Skilled Nursing Facility Primary support name:  [name] Primary support relationship to patient:  CHILD, ADULT Degree of support available:   supportive    CURRENT CONCERNS Current Concerns  Post-Acute Placement   Other Concerns:    SOCIAL WORK ASSESSMENT / PLAN CSW met with pt at bedside. Pt alert and oriented and reports he has been at [facility] for several days. He states he would like his daughter to make all decisions regarding d/c planning. Per [name], pt was at [facility] for a week and transferred to [facility] for therapy. He had been unable to start PT yet due to swelling. Pt is on CHF unit at [facility]. Prior to hospitalization at [facility], pt was living at home with his daughter who was there around the clock. [name] states that she would like to consider taking pt home from hospital as opposed to returning to SNF. He has had home health before and did very well with it. Pt ambulated in room using walker with min assist this morning which encouraged [name]. She would like to see how pt progresses in hospital prior to making decision. Per Tami at [facility], pt is okay to return and no FL2 is needed.   Assessment/plan status:  Psychosocial Support/Ongoing Assessment of Needs Other assessment/ plan:   Information/referral to community resources:   [facility]    PATIENT'S/FAMILY'S RESPONSE TO PLAN OF CARE: Pt defers d/c planning to daughter. CSW will continue to follow to determine d/c needs. CM updated on  potential return home.      [name], [place] [phone]

## 2012-08-03 NOTE — Discharge Summary (Signed)
Agree with above, will need f/u CTA and office visit  [name]

## 2012-08-03 NOTE — Progress Notes (Signed)
Chart reviewed, (current and old). Patient known to me from previous hospitalization. Discussed with daughter.  Subjective:  Denies shortness of breath. No complaints. Per daughter, patient was not on diuretics during the last hospitalization.   Objective: Vital signs in last 24 hours: Filed Vitals:   08/02/12 2126 08/02/12 2151 08/03/12 0500 08/03/12 1400  BP:  109/67 113/74 106/66  Pulse:  78 74 81  Temp:  98 F (36.7 C) 97.6 F (36.4 C) 98 F (36.7 C)  TempSrc:  Oral Oral Oral  Resp:  20 16 18   Height:      Weight:   112.8 kg (248 lb 10.9 oz)   SpO2: 87% 92% 93% 95%   Weight change: 0.3 kg (10.6 oz)  Intake/Output Summary (Last 24 hours) at 08/03/12 1600 Last data filed at 08/03/12 1200  Gross per 24 hour  Intake    720 ml  Output   2625 ml  Net  -1905 ml   General: Comfortable. Lungs clear to auscultation bilaterally without wheeze rhonchi or rales Cardiovascular irregularly irregular no murmurs gallops rubs Abdomen obese soft nontender Back left flank hematoma noted Extremities sequential compression devices in place. Edema noted.  Lab Results: Basic Metabolic Panel:  Recent Labs Lab 08/02/12 0638 08/03/12 0536  NA 138 136  K 3.5 4.0  CL 95* 93*  CO2 38* 38*  GLUCOSE 114* 114*  BUN 17 17  CREATININE 1.10 1.04  CALCIUM 8.5 8.7   Liver Function Tests:  Recent Labs Lab 07/31/12 1357 08/01/12 0544  AST 21 21  ALT 19 17  ALKPHOS 79 78  BILITOT 1.8* 1.6*  PROT 6.7 6.5  ALBUMIN 3.4* 3.4*   No results found for this basename: LIPASE, AMYLASE,  in the last 168 hours No results found for this basename: AMMONIA,  in the last 168 hours CBC:  Recent Labs Lab 07/31/12 1357 08/01/12 0544  WBC 9.0 7.0  NEUTROABS 7.2  --   HGB 10.8* 10.8*  HCT 34.5* 34.9*  MCV 100.3* 100.6*  PLT 178 183   Cardiac Enzymes:  Recent Labs Lab 07/31/12 1357  TROPONINI <0.30   BNP:  Recent Labs Lab 07/31/12 1357 08/03/12 0536  PROBNP 3377.0* 2605.0*    D-Dimer: No results found for this basename: DDIMER,  in the last 168 hours CBG:  Recent Labs Lab 07/27/12 1813 07/27/12 2118 07/28/12 0645 07/28/12 1108  GLUCAP 186* 120* 104* 133*    Micro Results: Recent Results (from the past 240 hour(s))  MRSA PCR SCREENING     Status: None   Collection Time    07/31/12 11:25 PM      Result Value Range Status   MRSA by PCR NEGATIVE  NEGATIVE Final   Comment:            The GeneXpert MRSA Assay (FDA     approved for NASAL specimens     only), is one component of a     comprehensive MRSA colonization     surveillance program. It is not     intended to diagnose MRSA     infection nor to guide or     monitor treatment for     MRSA infections.   Scheduled Meds: . atorvastatin  10 mg Oral q1800  . diltiazem  120 mg Oral QID  . furosemide  120 mg Intravenous BID  . metolazone  5 mg Oral Daily  . mometasone-formoterol  2 puff Inhalation BID  . multivitamin with minerals  1 tablet Oral Daily  .  pantoprazole  80 mg Oral Daily  . potassium chloride SA  40 mEq Oral BID  . sodium chloride  3 mL Intravenous Q12H  . Tamsulosin HCl  0.4 mg Oral Daily   Continuous Infusions:  PRN Meds:.albuterol, ipratropium, ondansetron (ZOFRAN) IV, ondansetron, oxyCODONE-acetaminophen, oxymetazoline Assessment/Plan: Principal Problem:   Acute on chronic diastolic congestive heart failure Active Problems:   Atrial fibrillation, chronic   Chronic respiratory failure   Obesity   GERD (gastroesophageal reflux disease)   Aorta aneurysm   Abdominal pain  Fluid overload related to transfusion and being off medications during previous hospitalization. Very slow to diuresis. Will increase Lasix to 120 milligrams twice daily IV and add Zaroxolyn. Change diet to 2 g sodium. Fluid restrict. Patient's daughter reports she will likely take him home once adequately diureses, rather than back to skilled nursing facility. Other medical problems are stable.    LOS:  3 days   Selvin Yun L 08/03/2012, 4:00 PM

## 2012-08-03 NOTE — Progress Notes (Signed)
UR Chart Review Completed  

## 2012-08-04 LAB — BASIC METABOLIC PANEL
BUN: 17 mg/dL (ref 6–23)
CO2: 41 mEq/L (ref 19–32)
Chloride: 90 mEq/L — ABNORMAL LOW (ref 96–112)
Creatinine, Ser: 1.1 mg/dL (ref 0.50–1.35)

## 2012-08-04 LAB — CBC
HCT: 36.4 % — ABNORMAL LOW (ref 39.0–52.0)
MCH: 31.7 pg (ref 26.0–34.0)
MCHC: 31 g/dL (ref 30.0–36.0)
MCV: 102 fL — ABNORMAL HIGH (ref 78.0–100.0)
RDW: 19.9 % — ABNORMAL HIGH (ref 11.5–15.5)

## 2012-08-04 MED ORDER — POTASSIUM CHLORIDE CRYS ER 20 MEQ PO TBCR
40.0000 meq | EXTENDED_RELEASE_TABLET | Freq: Three times a day (TID) | ORAL | Status: DC
  Administered 2012-08-04: 40 meq via ORAL
  Filled 2012-08-04: qty 2

## 2012-08-04 NOTE — Progress Notes (Signed)
Patient's CO2, 41,and Chloride 90.Dr Lendell Caprice notified.

## 2012-08-04 NOTE — Discharge Summary (Signed)
Physician Discharge Summary  Patient ID: [name] MRN: [id number] DOB/AGE: [birth year] 77 y.o.  Admit date: 07/31/2012 Discharge date: 08/04/2012  Discharge Diagnoses:  Principal Problem:   Acute on chronic diastolic congestive heart failure Active Problems:   Atrial fibrillation, chronic   Chronic respiratory failure   Obesity   GERD (gastroesophageal reflux disease)   Aorta aneurysm   Abdominal pain     Medication List    TAKE these medications       albuterol (2.5 MG/3ML) 0.083% nebulizer solution  Commonly known as:  PROVENTIL  Take 3 mLs (2.5 mg total) by nebulization every 6 (six) hours as needed. Mixes with the Ipratropium(for shortness of breath)     diltiazem 120 MG tablet  Commonly known as:  CARDIZEM  Take 1 tablet (120 mg total) by mouth 4 (four) times daily.     Fluticasone-Salmeterol 100-50 MCG/DOSE Aepb  Commonly known as:  ADVAIR  Inhale 1 puff into the lungs every 12 (twelve) hours.     furosemide 20 MG tablet  Commonly known as:  LASIX  Take 60 mg by mouth daily.     guaiFENesin 600 MG 12 hr tablet  Commonly known as:  MUCINEX  Take 600 mg by mouth 2 (two) times daily as needed. congestion     ipratropium 0.02 % nebulizer solution  Commonly known as:  ATROVENT  Take 500 mcg by nebulization every 6 (six) hours as needed. Shortness of breath     lovastatin 40 MG tablet  Commonly known as:  MEVACOR  Take 40 mg by mouth at bedtime. Only takes on Sun, Mon, Wed, and Fri.     multivitamin with minerals Tabs  Take 1 tablet by mouth daily.     nystatin 100000 UNIT/ML suspension  Commonly known as:  MYCOSTATIN  Take 500,000 Units by mouth 4 (four) times daily.     omeprazole 40 MG capsule  Commonly known as:  PRILOSEC  Take 40 mg by mouth daily.     oxyCODONE-acetaminophen 5-325 MG per tablet  Commonly known as:  ROXICET  Take 1 tablet by mouth every 4 (four) hours as needed for pain.     oxymetazoline 0.05 % nasal spray  Commonly known  as:  AFRIN  Place 1 spray into the nose daily as needed. Congestion     potassium chloride SA 20 MEQ tablet  Commonly known as:  K-DUR,KLOR-CON  Take 20 mEq by mouth 2 (two) times daily.     Tamsulosin HCl 0.4 MG Caps  Commonly known as:  FLOMAX  Take 1 capsule (0.4 mg total) by mouth daily.            Discharge Orders   Future Appointments Provider Department Dept Phone   08/05/2012 10:20 AM [facility] [phone]   08/10/2012 2:45 PM Nada Libman, MD [facility] [phone]   08/19/2012 10:20 AM [facility] [phone]   08/19/2012 10:30 AM Ellouise Newer, PA [facility] [phone]   09/07/2012 1:00 PM Kerri Perches, MD [facility] [phone]   09/28/2012 10:00 AM [facility] [place] [facility] [phone]   09/28/2012 10:30 AM [facility] [place] [facility] [phone]   Patient to arrive 15 minutes prior to appointment time.   09/28/2012 11:45 AM Nada Libman, MD [facility] [phone]   Future Orders Complete By Expires  Diet - low sodium heart healthy  As directed     Walk with assistance  As directed     Walker   As directed        Follow-up Information   Follow up with Wainwright Bing, MD In 2 weeks.   Contact information:   [address] [place] [address] [phone]       Disposition: Home with physical therapy and occupational therapy  Discharged Condition: Stable  Consults:   none  Labs:   Results for orders placed during the hospital encounter of 07/31/12 (from the past 48 hour(s))  BASIC METABOLIC PANEL     Status: Abnormal   Collection Time    08/03/12  5:36 AM      Result Value Range   Sodium 136  135 - 145 mEq/L   Potassium 4.0  3.5 - 5.1 mEq/L   Chloride 93 (*) 96 - 112 mEq/L   CO2 38 (*) 19 - 32 mEq/L   Glucose, Bld 114 (*) 70  - 99 mg/dL   BUN 17  6 - 23 mg/dL   Creatinine, Ser 9.14  0.50 - 1.35 mg/dL   Calcium 8.7  8.4 - 78.2 mg/dL   GFR calc non Af Amer 62 (*) >90 mL/min   GFR calc Af Amer 72 (*) >90 mL/min   Comment:            The eGFR has been calculated     using the CKD EPI equation.     This calculation has not been     validated in all clinical     situations.     eGFR's persistently     <90 mL/min signify     possible Chronic Kidney Disease.  PRO B NATRIURETIC PEPTIDE     Status: Abnormal   Collection Time    08/03/12  5:36 AM      Result Value Range   Pro B Natriuretic peptide (BNP) 2605.0 (*) 0 - 450 pg/mL  BASIC METABOLIC PANEL     Status: Abnormal   Collection Time    08/04/12  5:30 AM      Result Value Range   Sodium 138  135 - 145 mEq/L   Potassium 3.4 (*) 3.5 - 5.1 mEq/L   Chloride 90 (*) 96 - 112 mEq/L   CO2 41 (*) 19 - 32 mEq/L   Comment: CRITICAL RESULT CALLED TO, READ BACK BY AND VERIFIED WITH:     DILDY,V AT 6:55AM ON 08/04/12 BY FESTERMAN,C   Glucose, Bld 100 (*) 70 - 99 mg/dL   BUN 17  6 - 23 mg/dL   Creatinine, Ser 9.56  0.50 - 1.35 mg/dL   Calcium 9.0  8.4 - 21.3 mg/dL   GFR calc non Af Amer 58 (*) >90 mL/min   GFR calc Af Amer 67 (*) >90 mL/min   Comment:            The eGFR has been calculated     using the CKD EPI equation.     This calculation has not been     validated in all clinical     situations.     eGFR's persistently     <90 mL/min signify     possible Chronic Kidney Disease.  CBC     Status: Abnormal   Collection Time    08/04/12  5:30 AM      Result Value Range   WBC 6.8  4.0 - 10.5 K/uL  RBC 3.57 (*) 4.22 - 5.81 MIL/uL   Hemoglobin 11.3 (*) 13.0 - 17.0 g/dL   HCT 45.4 (*) 09.8 - 11.9 %   MCV 102.0 (*) 78.0 - 100.0 fL   MCH 31.7  26.0 - 34.0 pg   MCHC 31.0  30.0 - 36.0 g/dL   RDW 14.7 (*) 82.9 - 56.2 %   Platelets 166  150 - 400 K/uL    Diagnostics:  Ct Abdomen Pelvis Wo Contrast  07/22/2012  *RADIOLOGY REPORT*  Clinical Data:  Retroperitoneal bleed  CT ABDOMEN AND PELVIS WITHOUT CONTRAST  Technique:  Multidetector CT imaging of the abdomen and pelvis was performed following the standard protocol without intravenous contrast.  Comparison: 07/13/2012  Findings: Cardiomegaly.  Mitral valve replacement.  Trace left pleural effusion.  Bibasilar opacities, likely atelectasis.  Organ abnormality/lesion detection is limited in the absence of intravenous contrast. Within this limitation, lobular liver contour.  Gallstones.  Splenic granuloma.  Unremarkable pancreas and adrenal glands.  Bilateral retained renal contrast with delayed excretion. Bilateral renal cystic lesions, some of which are cysts and some which are incompletely characterized.  No hydronephrosis or hydroureter  Colonic diverticulosis. Decompressed small bowel loops. Surgical clip right lower quadrant may reflect appendectomy.  Retroperitoneal hematoma centered on the left, measuring up to 12 x 10.7 cm in axial dimensions (series 2 image 47) and 21.6 cm craniocaudal on coronal image 62, similar to slightly larger since the prior allowing for differences in slice selection.  Again, the hematoma is noted to extend anterior to the aorta, with small amount collecting along the small bowel mesenteric root and right retroperitoneum. The quantity collecting on the right has increased in the interval though remains ill defined/difficult to accurately measure.  Superiorly, hematoma abuts the posterior margin of the spleen.  Inferiorly, the hematoma extends along the left greater than right iliac vasculature. There is a preserved fat plane separating the majority of the hematoma from the aorta, with the exception of the component crossing midline, which is inseparable from the anterior margin of the aorta.  Scattered atherosclerosis of the aorta and branch vessels. Infrarenal aneurysm measures up to 5 cm in diameter, unchanged.  Contrast collects within the bladder.  Multiple bladder  diverticula.  Circumferential wall thickening.  TURP defect. Lobular prostate gland.  Osteopenia and multilevel degenerative changes without acute osseous finding.  IMPRESSION: Large retroperitoneal hematoma centered on the left. The dominant component is similar to minimally larger in size. There is a small amount of blood crossing midline and tracking along the right retroperitoneum, increased in the interval.  Unchanged differential.  Discussed via telephone with Dr. [name] Gianotti at 05:00 a.m. on 07/22/2012.  Retained contrast within the kidneys bilaterally.  Correlate with serial creatinine for contrast induced nephropathy.  Circumferential bladder wall thickening with multiple diverticula suggest chronic partial outlet obstruction.   Original Report Authenticated By: Jearld Lesch, M.D.    Ct Angio Chest Pe W/cm &/or Wo Cm  07/21/2012  *RADIOLOGY REPORT*  Clinical Data:  79 year old male with chest, abdominal and pelvic pain.  History of mitral valve replacement and GI bleed.  The patient is not currently on anticoagulation.  CT ANGIOGRAPHY CHEST, ABDOMEN AND PELVIS  Technique:  Multidetector CT imaging through the chest, abdomen and pelvis was performed using the standard protocol during bolus administration of intravenous contrast.  Multiplanar reconstructed images including MIPs were obtained and reviewed to evaluate the vascular anatomy.  Contrast: OMNIPAQUE IOHEXOL 350 MG/ML SOLN  Comparison:  07/09/2011  CTA CHEST  Findings:  Cardiomegaly and mitral valve replacement noted. Moderate coronary artery calcifications are present. And ascending thoracic aortic aneurysm is noted and unchanged measuring 5 cm in greatest diameter.  There is no evidence of dissection.  A small left pleural effusion is noted. There is no evidence of right pleural effusion or pericardial effusion. No enlarged lymph nodes are identified.  Bibasilar atelectasis is noted, left greater than right. There is no evidence of airspace  disease, consolidation, mass or endobronchial/endotracheal lesions.   Review of the MIP images confirms the above findings.  IMPRESSION: Unchanged cardiomegaly and ascending thoracic aortic aneurysm measuring 5 cm.  No evidence of dissection or rupture.  Small left pleural effusion with mild bibasilar atelectasis, left greater than right.  CTA ABDOMEN AND PELVIS  Findings:  A large left retroperitoneal hematoma involving the iliopsoas muscle is noted measuring 10 x 10 x 20 cm.  This hematoma lies extremely close to the unchanged 5 cm infrarenal suprailiac abdominal aortic aneurysm, but no definite connection, contrast extravasation, or irregularity of the aortic aneurysm wall is identified.  There is diffuse enlargement of the left iliopsoas musculature rather than displacement. This hematoma is favored to represent a spontaneous retroperitoneal hematoma, but given history of no anticoagulation, an abdominal aortic rupture without evidence of active arterial extravasation is not entirely excluded.  Cirrhosis is identified. Gallbladder sludge versus stones noted. A 2.2 x 2.5 cm anterior slightly hyperdense splenic lesion is nonspecific but statistically benign. Renal atrophy and bilateral renal cysts are present. The pancreas is unremarkable.  There is no evidence of biliary dilatation, enlarged lymph nodes, or free fluid. The bladder and bowel are within normal limits. No acute or suspicious bony abnormalities are identified. Degenerative changes throughout the spine are present.   Review of the MIP images confirms the above findings.  IMPRESSION: 10 x 10 x 20 cm left retroperitoneal hematoma adjacent to the a 5 cm infrarenal suprailiac abdominal aortic aneurysm.  Given the characteristics discussed above, a spontaneous retroperitoneal hematoma is favored.  A small focal abdominal aortic aneurysm rupture without contrast extravasation, wall irregularity or identifiable connection is a possibility given the fact that  this patient is not on anticoagulation.  Cirrhosis.  Gallbladder sludge versus cholelithiasis.  These results were called to Dr. Estell Harpin on 07/21/2012 at 9:15 a.m.   Original Report Authenticated By: Harmon Pier, M.D.    Ct Abdomen Pelvis W Contrast  07/31/2012  *RADIOLOGY REPORT*  Clinical Data: Abdominal pain.  Recent aneurysm surgery and retroperitoneal hematoma.  CT ABDOMEN AND PELVIS WITH CONTRAST  Technique:  Multidetector CT imaging of the abdomen and pelvis was performed following the standard protocol during bolus administration of intravenous contrast.  Contrast: 50mL OMNIPAQUE IOHEXOL 300 MG/ML  SOLN, OMNIPAQUE IOHEXOL 300 MG/ML  SOLN 11/31/2014  Comparison: Multiple exams, including 07/24/2012  Findings: Small left and moderate right pleural effusions with associated passive atelectasis noted.  There is evidence of old granulomatous disease of the lung bases, four-chamber cardiomegaly, and mitral valve calcification.  Atherosclerosis noted.  Left upper quadrant cystic lesion unchanged.  Bilateral renal cysts, unchanged.  Dependent density in the gallbladder may represent stones.  No specific hepatic abnormality observed.  Hematoma inseparable from the psoas muscle noted tracking along the posterior renal fascia, with mixed density.  Size not appreciably changed.  This tracks down into the upper part of the pelvis.  Stable infrarenal abdominal aortic aneurysm.  Stable atherosclerosis.  No new hemorrhage observed.  Urinary bladder unremarkable.  Levoconvex lumbar scoliosis noted with  rotary component.  Umbilical hernia contains adipose tissue, stable.  No dilated bowel observed.  Lumbar spondylosis results in multilevel foraminal impingement.  IMPRESSION:  1.  Overall stable size and appearance of left retroperitoneal hematoma. 2.  Small left and moderate right pleural effusions with passive atelectasis. 3.  Four-chamber cardiomegaly. 4.  Atherosclerosis. 5.  Bilateral renal cystic lesions,  unchanged. 6.  Stable infrarenal abdominal aortic aneurysm. 7.  Small umbilical hernia contains adipose tissue. 8.  Lumbar spondylosis with multilevel chronic foraminal impingement.   Original Report Authenticated By: Gaylyn Rong, M.D.    Dg Chest Port 1 View  08/01/2012  *RADIOLOGY REPORT*  Clinical Data: 79 year old male congestive heart failure, shortness of breath.  PORTABLE CHEST - 1 VIEW  Comparison: 07/31/2012 and earlier.  Findings: Portable upright AP view 1116 hours. Stable cardiomegaly and mediastinal contours.  Interval decreased bilateral pleural effusions and improved bibasilar ventilation.  Stable vascularity, no overt edema.  No pneumothorax.  Retrocardiac density is stable, favor related to chronic atelectasis.  IMPRESSION: Improved bibasilar ventilation with decreased pleural effusions. No acute edema.  Chronic retrocardiac atelectasis.   Original Report Authenticated By: Erskine Speed, M.D.    Dg Chest Portable 1 View  07/31/2012  *RADIOLOGY REPORT*  Clinical Data: Shortness of breath.  PORTABLE CHEST - 1 VIEW  Comparison: 07/23/2012  Findings: Mild congestive heart failure and small bilateral pleural effusions are suspected.  Heart is moderately enlarged.  IMPRESSION: Mild CHF with small bilateral pleural effusions.   Original Report Authenticated By: Irish Lack, M.D.    Dg Chest Port 1 View  07/23/2012  *RADIOLOGY REPORT*  Clinical Data: Follow up pleural effusion and atelectasis  PORTABLE CHEST - 1 VIEW  Comparison: 07/22/2012  Findings: The patient is status post median sternotomy and valve replacement.  Cardiomegaly is again identified and appears unchanged in degree.  Aortic ectasia with aortic calcification is also unchanged.  The right costophrenic angle has been excluded from the film making evaluation for small amount of right pleural fluid suboptimal.  No gross pleural effusion is seen on the right.  Persistent density at the left lung base is noted and likely represents  atelectasis combined with a small amount of pleural fluid.  The possibility of an underlying infiltrate is not excluded.  Pulmonary vascular congestion without signs of overt congestive failure are noted.  IMPRESSION: Excluded right costophrenic angle.  Stable cardiomegaly, mild pulmonary vascular congestion and left lower lobe atelectasis/effusion.   Original Report Authenticated By: Rhodia Albright, M.D.    Dg Chest Port 1 View  07/22/2012  *RADIOLOGY REPORT*  Clinical Data: Left pleural effusion.  PORTABLE CHEST - 1 VIEW  Comparison: 01/28 and 07/13/2012  Findings: There is chronic cardiomegaly.  Pulmonary vascularity is now normal.  Right lung is clear.  Increased density at the left lung base consistent with a combination of pleural effusion and atelectasis.  IMPRESSION:  Increased atelectasis/small effusion at the left lung base. Pulmonary vascularity is now normal.   Original Report Authenticated By: Francene Boyers, M.D.    Dg Chest Portable 1 View  07/21/2012  *RADIOLOGY REPORT*  Clinical Data: Chest pain.  PORTABLE CHEST - 1 VIEW  Comparison: 07/13/2012.  Findings: Cardiomegaly.  Slight improved aeration.  Elevated left hemidiaphragm.  Some clearing of edema and atelectasis.  Calcified tortuous aorta.  Prior median sternotomy.  Mild vascular congestion.  IMPRESSION: Cardiomegaly.  Mild vascular congestion.  Slight improvement aeration from previous radiograph.   Original Report Authenticated By: Davonna Belling, M.D.  Portable Chest 1 View  07/13/2012  *RADIOLOGY REPORT*  Clinical Data: Shortness of breath and respiratory failure.  PORTABLE CHEST - 1 VIEW  Comparison: 07/12/2012  Findings: Increased opacities are again present bilaterally superimposed on chronic lung disease.  There may be a component of interstitial edema as well as potential pneumonia at both bases, right greater than left.  Significant cardiomegaly is unchanged.  IMPRESSION: Persistent abnormalities suggestive of interstitial edema  as well as potential pneumonia.   Original Report Authenticated By: Irish Lack, M.D.    Dg Chest Portable 1 View  07/12/2012  *RADIOLOGY REPORT*  Clinical Data: Shortness of breath.  PORTABLE CHEST - 1 VIEW  Comparison: 03/13/2012  Findings: Interstitial densities at the lung bases, right side greater than left.  Again noted is cardiomegaly and status post cardiac surgery.  There is mild peribronchial thickening.  IMPRESSION: Basilar atelectasis versus basilar interstitial edema.  Stable cardiomegaly.   Original Report Authenticated By: Richarda Overlie, M.D.    Ct Angio Abd/pel W/ And/or W/o  07/24/2012  *RADIOLOGY REPORT*  Clinical Data: Retroperitoneal hematoma  CT ANGIOGRAPHY ABDOMEN AND PELVIS  Technique:  Multidetector CT imaging of the abdomen and pelvis was performed using the standard protocol before and during bolus administration of intravenous contrast.  Multiplanar reconstructed images including MIPs were obtained and reviewed to evaluate the vascular anatomy.  Contrast: OMNIPAQUE IOHEXOL 350 MG/ML SOLN  Comparison:  07/22/2012  Arterial findings: Aorta:                  Scattered calcified plaque in the visualized distal descending thoracic and suprarenal abdominal aorta.  A fusiform infrarenal aneurysm measuring 5 cm maximal transverse diameter as before. Aorta tapers to   2.7 cm at the bifurcation.  No intraluminal thrombus.  No evidence of active extravasation.  Celiac axis:            Mild ostial plaque, unremarkable distal branching  Superior mesenteric:Scattered plaque, no high-grade stenosis  Left renal:             Single, with scattered plaque, no high- grade stenosis.  Right renal:            Single, with scattered plaque, no high- grade proximal stenosis.  Inferior mesenteric:Patent, arising from the aneurysmal segment of the aorta.  Left iliac:             Scattered atheromatous plaque throughout without aneurysm, dissection or stenosis.  The stent in the proximal SFA is partially  visualized.  The  Right iliac:            Scattered calcified plaque without high- grade stenosis, aneurysm or dissection.  Venous findings:  Patent hepatic veins, portal vein, superior mesenteric vein, splenic vein, bilateral renal veins, IVC, and iliac venous system.   Review of the MIP images confirms the above findings.  Nonvascular findings: 9.5 x 11 x 15.9 cm left retroperitoneal hematoma without convincing interval increase in size since the previous study.  No evidence of active extravasation.  Small bilateral pleural effusions persist, with dependent atelectasis/consolidation posteriorly in both lower lobes, left greater than right.  There is four-chamber cardiac enlargement. Previous median sternotomy.  Gallbladder physiologically distended with multiple small layering partially calcified calculi in its dependent aspect.  Unremarkable liver.  Scattered splenic granulomas.  Small bilateral renal cysts.  Mild pancreatic parenchymal atrophy.  There are streaky inflammatory/edematous changes in the retroperitoneum anterior to the aorta and extending across the midline anterior to the   right psoas  musculature in the pelvis, contiguous with the left retroperitoneal hematoma. Stomach, small bowel, and colon are non distended.  The   urinary bladder is incompletely distended.  There is a trace amount of pelvic ascites.  No free air.  No adenopathy localized.  Multilevel spondylitic changes and scoliosis in the lumbar spine.  IMPRESSION:  1.  Stable   large left retroperitoneal hematoma.  No evidence of active extravasation or other acute arterial abnormality. 2.  Stable 5 cm fusiform abdominal aortic aneurysm. 3.  Cholelithiasis.   Original Report Authenticated By: D. Andria Rhein, MD    Ct Angio Abd/pel W/ And/or W/o  07/21/2012  *RADIOLOGY REPORT*  Clinical Data:  79 year old male with chest, abdominal and pelvic pain.  History of mitral valve replacement and GI bleed.  The patient is not currently on  anticoagulation.  CT ANGIOGRAPHY CHEST, ABDOMEN AND PELVIS  Technique:  Multidetector CT imaging through the chest, abdomen and pelvis was performed using the standard protocol during bolus administration of intravenous contrast.  Multiplanar reconstructed images including MIPs were obtained and reviewed to evaluate the vascular anatomy.  Contrast: OMNIPAQUE IOHEXOL 350 MG/ML SOLN  Comparison:  07/09/2011  CTA CHEST  Findings:  Cardiomegaly and mitral valve replacement noted. Moderate coronary artery calcifications are present. And ascending thoracic aortic aneurysm is noted and unchanged measuring 5 cm in greatest diameter.  There is no evidence of dissection.  A small left pleural effusion is noted. There is no evidence of right pleural effusion or pericardial effusion. No enlarged lymph nodes are identified.  Bibasilar atelectasis is noted, left greater than right. There is no evidence of airspace disease, consolidation, mass or endobronchial/endotracheal lesions.   Review of the MIP images confirms the above findings.  IMPRESSION: Unchanged cardiomegaly and ascending thoracic aortic aneurysm measuring 5 cm.  No evidence of dissection or rupture.  Small left pleural effusion with mild bibasilar atelectasis, left greater than right.  CTA ABDOMEN AND PELVIS  Findings:  A large left retroperitoneal hematoma involving the iliopsoas muscle is noted measuring 10 x 10 x 20 cm.  This hematoma lies extremely close to the unchanged 5 cm infrarenal suprailiac abdominal aortic aneurysm, but no definite connection, contrast extravasation, or irregularity of the aortic aneurysm wall is identified.  There is diffuse enlargement of the left iliopsoas musculature rather than displacement. This hematoma is favored to represent a spontaneous retroperitoneal hematoma, but given history of no anticoagulation, an abdominal aortic rupture without evidence of active arterial extravasation is not entirely excluded.  Cirrhosis is  identified. Gallbladder sludge versus stones noted. A 2.2 x 2.5 cm anterior slightly hyperdense splenic lesion is nonspecific but statistically benign. Renal atrophy and bilateral renal cysts are present. The pancreas is unremarkable.  There is no evidence of biliary dilatation, enlarged lymph nodes, or free fluid. The bladder and bowel are within normal limits. No acute or suspicious bony abnormalities are identified. Degenerative changes throughout the spine are present.   Review of the MIP images confirms the above findings.  IMPRESSION: 10 x 10 x 20 cm left retroperitoneal hematoma adjacent to the a 5 cm infrarenal suprailiac abdominal aortic aneurysm.  Given the characteristics discussed above, a spontaneous retroperitoneal hematoma is favored.  A small focal abdominal aortic aneurysm rupture without contrast extravasation, wall irregularity or identifiable connection is a possibility given the fact that this patient is not on anticoagulation.  Cirrhosis.  Gallbladder sludge versus cholelithiasis.  These results were called to Dr. Estell Harpin on 07/21/2012 at 9:15 a.m.  Original Report Authenticated By: Harmon Pier, M.D.    EKG: EKG showed atrial fibrillation  Full Code   Hospital Course: See H&P for complete admission details. Mr. Dingley is an 79 year old white male with multiple medical problems including diastolic heart failure. He had been convalescing at skilled nursing facility after sustaining a retroperitoneal hematoma. He was sent to the emergency room for severe swelling of the legs and scrotum. His daughter reported that he had not been on his diuretics while hospitalized the week prior to admission. He also received reportedly 4 units of packed red blood cells at the time. He had no reports of shortness of breath but had evidence of edema on physical examination. He has chronic hypoxic respiratory failure and was maintaining normal oxygen saturation on supplemental oxygen. He was admitted, started  on IV Lasix. There is a slow slow, so his Lasix dose was accelerated. He also was started on Zaroxolyn with good results. By the time of discharge he is about at his dry weight of 238 pounds. Patient's daughter would like to take him back home rather than back to skilled nursing facility. Home physical therapy and nursing has been arranged. Total time on the day of discharge greater than 30 minutes.  Discharge Exam:  Blood pressure 96/59, pulse 78, temperature 97.6 F (36.4 C), temperature source Oral, resp. rate 20, height 6\' 2"  (1.88 m), weight 108.1 kg (238 lb 5.1 oz), SpO2 96.00%.  General: Alert. Oriented. Comfortable. Lungs clear to auscultation bilaterally without wheeze rhonchi or rales Cardiovascular irregularly irregular Abdomen soft nontender Extremities no edema  Signed: Faythe Heitzenrater L 08/04/2012, 11:02 AM

## 2012-08-04 NOTE — Care Management Note (Signed)
    Page 1 of 1   08/04/2012     1:54:19 PM   CARE MANAGEMENT NOTE 08/04/2012  Patient:  [name], [name]   Account Number:  [id number]  Date Initiated:  08/04/2012  Documentation initiated by:  Rosemary Holms  Subjective/Objective Assessment:   Pt admitted from [facility] with CHF. Daughter has decided to take pt home with [facility]. CM also made a referral to re-instate [facility] to assist.     Action/Plan:   Anticipated DC Date:  08/04/2012   Anticipated DC Plan:  HOME W HOME HEALTH SERVICES  In-house referral  Clinical Social Worker      DC Planning Services  CM consult      Choice offered to / List presented to:          [facility] arranged  HH-1 RN  HH-10 DISEASE MANAGEMENT  HH-2 PT  HH-6 SOCIAL WORKER      HH agency  [facility]   Status of service:  Completed, signed off Medicare Important Message given?   (If response is "NO", the following Medicare IM given date fields will be blank) Date Medicare IM given:   Date Additional Medicare IM given:    Discharge Disposition:  HOME W HOME HEALTH SERVICES  Per UR Regulation:    If discussed at Long Length of Stay Meetings, dates discussed:    Comments:  08/04/12 Rosemary Holms RN BNS CM Daughter to come by around 2:30 to 3:00 to take pt home. Reminded to bring O2 tank.

## 2012-08-04 NOTE — Clinical Social Work Note (Signed)
CSW spoke with pt's daughter as she was considering having pt return to [facility] at d/c. She reports this morning that due to potential bad weather, she wanted pt to return to SNF. CSW spoke with Tami at [facility] who reports no male beds today. [name] aware and does not want to look at other SNF. She feels it is okay for pt to return home with home health and will be here this afternoon to transport. CM and MD notified. CSW signing off.   [name], [place] [phone]

## 2012-08-05 ENCOUNTER — Other Ambulatory Visit (HOSPITAL_COMMUNITY): Payer: Medicare Other

## 2012-08-05 ENCOUNTER — Telehealth: Payer: Self-pay | Admitting: Surgery

## 2012-08-05 NOTE — Telephone Encounter (Addendum)
Message copied by Shari Prows on Wed Aug 05, 2012 10:30 AM ------      Message from: Melene Plan      Created: Tue Jul 28, 2012  2:15 PM                   ----- Message -----         From: Lars Mage, PA         Sent: 07/28/2012  12:56 PM           To: Melene Plan, RN            F/U with Dr. [name] Gianotti in 1 week with [facility] before visit thanks [facility].  He is going to a SNF today.  Retroperitoneal bleed left.  AAA. ------  I scheduled an appt for the above pt on 07/25/12 for a cta at 9am at [facility] imaging and to see [facility] here at 11am. I left a meesage for the pt on his home phone and also mailed a letter.awt

## 2012-08-06 NOTE — Clinical Social Work Note (Signed)
CSW received call from Naida Sleight, patient's daughter, requesting CSW assistance w placing father/patient.  Patient is active w [facility], referred daughter to SW at [facility], and suggested daughter call [facility] directly to see if they can assist at this point.   Santa Genera, LCSW Clinical Social Worker [phone])

## 2012-08-07 ENCOUNTER — Telehealth: Payer: Self-pay | Admitting: Family Medicine

## 2012-08-10 ENCOUNTER — Other Ambulatory Visit: Payer: Medicare Other

## 2012-08-10 ENCOUNTER — Ambulatory Visit: Payer: Medicare Other | Admitting: Surgery

## 2012-08-10 NOTE — Telephone Encounter (Signed)
attmepted to call [name],no answer

## 2012-08-12 ENCOUNTER — Emergency Department (HOSPITAL_COMMUNITY): Payer: Medicare Other

## 2012-08-12 ENCOUNTER — Encounter (HOSPITAL_COMMUNITY): Payer: Self-pay

## 2012-08-12 ENCOUNTER — Inpatient Hospital Stay (HOSPITAL_COMMUNITY)
Admission: EM | Admit: 2012-08-12 | Discharge: 2012-08-20 | DRG: 291 | Disposition: A | Discharge status: Skilled Nursing Facility | Payer: Medicare Other | Attending: Internal Medicine | Admitting: Internal Medicine

## 2012-08-12 DIAGNOSIS — I071 Rheumatic tricuspid insufficiency: Secondary | ICD-10-CM | POA: Diagnosis present

## 2012-08-12 DIAGNOSIS — I2789 Other specified pulmonary heart diseases: Secondary | ICD-10-CM | POA: Diagnosis present

## 2012-08-12 DIAGNOSIS — I5023 Acute on chronic systolic (congestive) heart failure: Secondary | ICD-10-CM

## 2012-08-12 DIAGNOSIS — E785 Hyperlipidemia, unspecified: Secondary | ICD-10-CM | POA: Diagnosis present

## 2012-08-12 DIAGNOSIS — I714 Abdominal aortic aneurysm, without rupture, unspecified: Secondary | ICD-10-CM | POA: Diagnosis present

## 2012-08-12 DIAGNOSIS — I252 Old myocardial infarction: Secondary | ICD-10-CM

## 2012-08-12 DIAGNOSIS — I4891 Unspecified atrial fibrillation: Secondary | ICD-10-CM | POA: Diagnosis present

## 2012-08-12 DIAGNOSIS — D7589 Other specified diseases of blood and blood-forming organs: Secondary | ICD-10-CM | POA: Diagnosis present

## 2012-08-12 DIAGNOSIS — M1990 Unspecified osteoarthritis, unspecified site: Secondary | ICD-10-CM | POA: Diagnosis present

## 2012-08-12 DIAGNOSIS — I509 Heart failure, unspecified: Secondary | ICD-10-CM

## 2012-08-12 DIAGNOSIS — T380X5A Adverse effect of glucocorticoids and synthetic analogues, initial encounter: Secondary | ICD-10-CM | POA: Diagnosis present

## 2012-08-12 DIAGNOSIS — I10 Essential (primary) hypertension: Secondary | ICD-10-CM | POA: Diagnosis present

## 2012-08-12 DIAGNOSIS — R7989 Other specified abnormal findings of blood chemistry: Secondary | ICD-10-CM | POA: Diagnosis present

## 2012-08-12 DIAGNOSIS — I5021 Acute systolic (congestive) heart failure: Secondary | ICD-10-CM | POA: Diagnosis present

## 2012-08-12 DIAGNOSIS — G4733 Obstructive sleep apnea (adult) (pediatric): Secondary | ICD-10-CM | POA: Diagnosis present

## 2012-08-12 DIAGNOSIS — Z87891 Personal history of nicotine dependence: Secondary | ICD-10-CM

## 2012-08-12 DIAGNOSIS — Z8701 Personal history of pneumonia (recurrent): Secondary | ICD-10-CM

## 2012-08-12 DIAGNOSIS — J9690 Respiratory failure, unspecified, unspecified whether with hypoxia or hypercapnia: Secondary | ICD-10-CM

## 2012-08-12 DIAGNOSIS — J449 Chronic obstructive pulmonary disease, unspecified: Secondary | ICD-10-CM | POA: Diagnosis present

## 2012-08-12 DIAGNOSIS — I5043 Acute on chronic combined systolic (congestive) and diastolic (congestive) heart failure: Principal | ICD-10-CM | POA: Diagnosis present

## 2012-08-12 DIAGNOSIS — Z79899 Other long term (current) drug therapy: Secondary | ICD-10-CM

## 2012-08-12 DIAGNOSIS — Z6828 Body mass index (BMI) 28.0-28.9, adult: Secondary | ICD-10-CM

## 2012-08-12 DIAGNOSIS — T50905A Adverse effect of unspecified drugs, medicaments and biological substances, initial encounter: Secondary | ICD-10-CM | POA: Diagnosis present

## 2012-08-12 DIAGNOSIS — I5033 Acute on chronic diastolic (congestive) heart failure: Secondary | ICD-10-CM | POA: Diagnosis present

## 2012-08-12 DIAGNOSIS — D649 Anemia, unspecified: Secondary | ICD-10-CM

## 2012-08-12 DIAGNOSIS — J9620 Acute and chronic respiratory failure, unspecified whether with hypoxia or hypercapnia: Secondary | ICD-10-CM | POA: Diagnosis present

## 2012-08-12 DIAGNOSIS — R0902 Hypoxemia: Secondary | ICD-10-CM

## 2012-08-12 DIAGNOSIS — I059 Rheumatic mitral valve disease, unspecified: Secondary | ICD-10-CM | POA: Diagnosis present

## 2012-08-12 DIAGNOSIS — I079 Rheumatic tricuspid valve disease, unspecified: Secondary | ICD-10-CM | POA: Diagnosis present

## 2012-08-12 DIAGNOSIS — I719 Aortic aneurysm of unspecified site, without rupture: Secondary | ICD-10-CM

## 2012-08-12 DIAGNOSIS — Z954 Presence of other heart-valve replacement: Secondary | ICD-10-CM

## 2012-08-12 DIAGNOSIS — I2510 Atherosclerotic heart disease of native coronary artery without angina pectoris: Secondary | ICD-10-CM

## 2012-08-12 DIAGNOSIS — I350 Nonrheumatic aortic (valve) stenosis: Secondary | ICD-10-CM | POA: Diagnosis present

## 2012-08-12 DIAGNOSIS — R7309 Other abnormal glucose: Secondary | ICD-10-CM | POA: Diagnosis present

## 2012-08-12 DIAGNOSIS — J9602 Acute respiratory failure with hypercapnia: Secondary | ICD-10-CM

## 2012-08-12 DIAGNOSIS — R739 Hyperglycemia, unspecified: Secondary | ICD-10-CM

## 2012-08-12 DIAGNOSIS — I482 Chronic atrial fibrillation, unspecified: Secondary | ICD-10-CM | POA: Diagnosis present

## 2012-08-12 DIAGNOSIS — K219 Gastro-esophageal reflux disease without esophagitis: Secondary | ICD-10-CM | POA: Diagnosis present

## 2012-08-12 DIAGNOSIS — D72829 Elevated white blood cell count, unspecified: Secondary | ICD-10-CM | POA: Diagnosis present

## 2012-08-12 DIAGNOSIS — E669 Obesity, unspecified: Secondary | ICD-10-CM

## 2012-08-12 DIAGNOSIS — J441 Chronic obstructive pulmonary disease with (acute) exacerbation: Secondary | ICD-10-CM | POA: Diagnosis present

## 2012-08-12 DIAGNOSIS — R443 Hallucinations, unspecified: Secondary | ICD-10-CM | POA: Diagnosis present

## 2012-08-12 DIAGNOSIS — D509 Iron deficiency anemia, unspecified: Secondary | ICD-10-CM | POA: Diagnosis present

## 2012-08-12 DIAGNOSIS — G9340 Encephalopathy, unspecified: Secondary | ICD-10-CM | POA: Diagnosis present

## 2012-08-12 DIAGNOSIS — J9600 Acute respiratory failure, unspecified whether with hypoxia or hypercapnia: Secondary | ICD-10-CM

## 2012-08-12 DIAGNOSIS — J189 Pneumonia, unspecified organism: Secondary | ICD-10-CM | POA: Diagnosis present

## 2012-08-12 DIAGNOSIS — Z951 Presence of aortocoronary bypass graft: Secondary | ICD-10-CM

## 2012-08-12 DIAGNOSIS — I359 Nonrheumatic aortic valve disorder, unspecified: Secondary | ICD-10-CM | POA: Diagnosis present

## 2012-08-12 DIAGNOSIS — E873 Alkalosis: Secondary | ICD-10-CM

## 2012-08-12 HISTORY — DX: Nonrheumatic aortic (valve) stenosis: I35.0

## 2012-08-12 HISTORY — DX: Hemorrhage, not elsewhere classified: R58

## 2012-08-12 HISTORY — DX: Rheumatic tricuspid insufficiency: I07.1

## 2012-08-12 HISTORY — DX: Other specified diseases of blood and blood-forming organs: D75.89

## 2012-08-12 HISTORY — DX: Acute systolic (congestive) heart failure: I50.21

## 2012-08-12 HISTORY — DX: Chronic obstructive pulmonary disease, unspecified: J44.9

## 2012-08-12 HISTORY — DX: Pulmonary hypertension due to lung diseases and hypoxia: I27.23

## 2012-08-12 LAB — CBC WITH DIFFERENTIAL/PLATELET
Basophils Relative: 0 % (ref 0–1)
Eosinophils Absolute: 0.1 10*3/uL (ref 0.0–0.7)
MCH: 32.4 pg (ref 26.0–34.0)
MCHC: 31.5 g/dL (ref 30.0–36.0)
Monocytes Relative: 12 % (ref 3–12)
Neutrophils Relative %: 79 % — ABNORMAL HIGH (ref 43–77)
Platelets: 167 10*3/uL (ref 150–400)
RDW: 18.7 % — ABNORMAL HIGH (ref 11.5–15.5)

## 2012-08-12 LAB — BASIC METABOLIC PANEL
BUN: 26 mg/dL — ABNORMAL HIGH (ref 6–23)
GFR calc Af Amer: 66 mL/min — ABNORMAL LOW (ref >90)
GFR calc non Af Amer: 57 mL/min — ABNORMAL LOW (ref >90)
Potassium: 4.1 mEq/L (ref 3.5–5.1)
Sodium: 135 mEq/L (ref 135–145)

## 2012-08-12 MED ORDER — ALBUTEROL SULFATE (5 MG/ML) 0.5% IN NEBU
5.0000 mg | INHALATION_SOLUTION | Freq: Once | RESPIRATORY_TRACT | Status: AC
  Administered 2012-08-12: 5 mg via RESPIRATORY_TRACT
  Filled 2012-08-12: qty 1

## 2012-08-12 MED ORDER — IPRATROPIUM BROMIDE 0.02 % IN SOLN
0.5000 mg | Freq: Once | RESPIRATORY_TRACT | Status: AC
  Administered 2012-08-12: 0.5 mg via RESPIRATORY_TRACT
  Filled 2012-08-12: qty 2.5

## 2012-08-12 MED ORDER — NITROGLYCERIN 2 % TD OINT
1.0000 [in_us] | TOPICAL_OINTMENT | Freq: Once | TRANSDERMAL | Status: AC
  Administered 2012-08-12: 1 [in_us] via TOPICAL
  Filled 2012-08-12: qty 1

## 2012-08-12 MED ORDER — ALBUTEROL (5 MG/ML) CONTINUOUS INHALATION SOLN
15.0000 mg | INHALATION_SOLUTION | Freq: Once | RESPIRATORY_TRACT | Status: AC
  Administered 2012-08-12: 15 mg via RESPIRATORY_TRACT
  Filled 2012-08-12: qty 20

## 2012-08-12 MED ORDER — FUROSEMIDE 10 MG/ML IJ SOLN
80.0000 mg | Freq: Once | INTRAMUSCULAR | Status: AC
  Administered 2012-08-12: 80 mg via INTRAVENOUS
  Filled 2012-08-12 (×2): qty 4

## 2012-08-12 NOTE — ED Notes (Signed)
Bipap to be removed by respiratory. Placed on veni mask @ 50%.

## 2012-08-12 NOTE — ED Provider Notes (Signed)
History     This chart was scribed for Ward Givens, MD, MD by Burman Nieves ED Scribe. The patient was seen in room APA14/APA14 and the patient's care was started at 5:52 PM.  CSN: [id number]  Arrival date & time 08/12/12  1727    Chief Complaint  Patient presents with  . Shortness of Breath   Level V caveat for respiratory distress  (Consider location/radiation/quality/duration/timing/severity/associated sxs/prior treatment) The history is provided by the patient and a relative. No language interpreter was used.   [name] is a 79 y.o. male with history three stints, HTN, atrial fib, COPD, and CHF who presents to the Emergency Department by ambulance complaining of moderate constant shortness of breath onset yesterday. Pt. denies any chest pain.He reports he has been coughing up yellow sputum. He complains of bilateral thigh pain L>R that has been on going. Pt denies fever, chills, cough, nausea, vomiting, diarrhea, weakness, and any other associated symptoms. CHF and was discharged Monday August 03, 2012.    Wife reports pt's urine color is dark yellow. Pt takes lasix, but  hasn't been eating or drinking, but he did eat a salami sandwich 2 nights ago. Daughter reports when he was admitted to the hospital his weight dropped from 260 pounds to 230 pounds. He was diagnosed with congestive heart failure and that was in January. She states he was at the [facility] prior to his last admission to the hospital, however he lost his room at the [facility] center. He was discharged from the hospital to go home on February 10.  PCP is Dr. Lodema Hong Cardiologist is Dr. Dietrich Pates   Past Medical History  Diagnosis Date  . Mitral valve prolapse 2003    MVR in 2003  . Obesity   . Osteoarthritis   . Hypertension   . Hyperlipidemia   . COPD (chronic obstructive pulmonary disease)   . CHF (congestive heart failure) diastolic 10/2010    CXR in 12/2010: Prior CABG, cardiomegaly, vascular redistribution,  bibasilar atelectasis, small effusions  . Pneumonia   . Impaired glucose tolerance   . Atrial fibrillation, chronic   . Prostate cancer     elevated PSA  . GERD (gastroesophageal reflux disease)     + hiatal hernia  . Arteriosclerotic cardiovascular disease (ASCVD) 1994    stent to RCA; CABG-2003  . Hard of hearing   . Anemia, iron deficiency   . AAA (abdominal aortic aneurysm)     Fusiform; infrarenal; 4-4.1cm on CT in 07/2008 and 12/2010 by MRI  . Gastric ulcer 2004    2004; upper GI bleed  . Renal cysts, acquired, bilateral     Complex by MRI in 12/2010  . Adenomatous polyps 04/07/2012  . HYPERTENSION 12/17/2007    Subsequently hypotensive and all antihypertensive medication discontinued Lab  03/2012: Mild anemia with H&H-11.8/37.5, MCV-97, ferritin-224, normal CMet ex G-124, alb-3.4   . NSTEMI (non-ST elevated myocardial infarction) 01/18/2011  . Diabetes mellitus, type II 07/13/12    family denies patient is diabetic    Past Surgical History  Procedure Laterality Date  . Knee surgery      Left  . Bladder surgery    . Femoral artery stent  12-06-10    Left SFA  . Appendectomy    . Mitral valve replacement (mvr)/coronary artery bypass grafting (cabg)  2003    stent to RCA in 1994  . Eye surgery  2007    bilateral cataracts  . Tonsillectomy  thinks they were removed while in the [employer]  . Mass excision  06/07/2011    Procedure: EXCISION MASS;  Surgeon: Fabio Bering;  Location: [facility];  Service: General;  Laterality: N/A;  excision of 2 masses back and buttocks  . Back surgery    . Esophagogastroduodenoscopy  03/03/2003    Large, deep prepyloric ulcer, as described above without bleeding/ Normal esophagus  . Esophagogastroduodenoscopy  09/03/2004    normal throughout  . Colonoscopy  09/03/2004    small ulcer, without stigmata of bleeding  . Esophagogastroduodenoscopy  11/14/2004    Normal esophagus and small hiatal hernia, otherwise normal stomach   . Colonoscopy   11/14/2004    Internal hemorrhoids, otherwise normal rectum/ left-sided diverticula , diffusely oozing right colon mucosa without a discrete lesion amenable to endoscopic therapy. 2 diminutive polyps. FELT TO HAVE AVMs/telangiectasias  . Colonoscopy  01/29/2005    Normal rectum  . Colonoscopy  03/11/2012    Colonic diverticulosis. Colonic polyps-removed as described above. Vascular anomalies in the cecum likely representing hemangiomas. Status post hemostasis clipping of  2 of the 3. ADENOMATOUS POLYPS. Repeat 2016  . Esophagogastroduodenoscopy  03/11/2012    Deformity of the antrum;  small polyp in antrum-not manipulated. Otherwise normal exam    Family History  Problem Relation Age of Onset  . Stroke Mother   . Stroke Brother   . Heart attack Brother   . Cancer Brother   . Cancer Brother   . Anesthesia problems Neg Hx   . Hypotension Neg Hx   . Malignant hyperthermia Neg Hx   . Pseudochol deficiency Neg Hx     History  Substance Use Topics  . Smoking status: Former Smoker -- 1.00 packs/day for 70 years    Types: Cigarettes    Quit date: 06/25/2007  . Smokeless tobacco: Former Neurosurgeon  . Alcohol Use: No   Lives at home Lives with daughter  oxygen 4 lpm Ruch Uses a walker  Review of Systems  Constitutional: Negative for fever and chills.  Respiratory: Positive for cough and shortness of breath.   Gastrointestinal: Negative for nausea and vomiting.  All other systems reviewed and are negative.    Allergies  Gabapentin  Home Medications   Current Outpatient Rx  Name  Route  Sig  Dispense  Refill  . albuterol (PROVENTIL) (2.5 MG/3ML) 0.083% nebulizer solution   Nebulization   Take 3 mLs (2.5 mg total) by nebulization every 6 (six) hours as needed. Mixes with the Ipratropium(for shortness of breath)   300 mL   1   . diltiazem (CARDIZEM) 120 MG tablet   Oral   Take 1 tablet (120 mg total) by mouth 4 (four) times daily.   30 tablet   0   . Fluticasone-Salmeterol  (ADVAIR) 100-50 MCG/DOSE AEPB   Inhalation   Inhale 1 puff into the lungs every 12 (twelve) hours.         . furosemide (LASIX) 20 MG tablet   Oral   Take 60 mg by mouth daily.         Marland Kitchen guaiFENesin (MUCINEX) 600 MG 12 hr tablet   Oral   Take 600 mg by mouth 2 (two) times daily as needed. congestion         . ipratropium (ATROVENT) 0.02 % nebulizer solution   Nebulization   Take 500 mcg by nebulization every 6 (six) hours as needed. Shortness of breath         . lovastatin (MEVACOR) 40 MG  tablet   Oral   Take 40 mg by mouth at bedtime. Only takes on Sun, Mon, Wed, and Fri.         . Multiple Vitamin (MULTIVITAMIN WITH MINERALS) TABS   Oral   Take 1 tablet by mouth daily.         Marland Kitchen nystatin (MYCOSTATIN) 100000 UNIT/ML suspension   Oral   Take 500,000 Units by mouth 4 (four) times daily.         Marland Kitchen omeprazole (PRILOSEC) 40 MG capsule   Oral   Take 40 mg by mouth daily.         Marland Kitchen oxyCODONE-acetaminophen (ROXICET) 5-325 MG per tablet   Oral   Take 1 tablet by mouth every 4 (four) hours as needed for pain.   30 tablet   0   . oxymetazoline (AFRIN) 0.05 % nasal spray   Nasal   Place 1 spray into the nose daily as needed. Congestion         . potassium chloride SA (K-DUR,KLOR-CON) 20 MEQ tablet   Oral   Take 20 mEq by mouth 2 (two) times daily.         . Tamsulosin HCl (FLOMAX) 0.4 MG CAPS   Oral   Take 1 capsule (0.4 mg total) by mouth daily.   30 capsule   0     BP 115/58  Pulse 88  Temp(Src) 99.1 F (37.3 C) (Oral)  Resp 34  Ht 6\' 3"  (1.905 m)  Wt 230 lb (104.327 kg)  BMI 28.75 kg/m2  SpO2 87%   Vital signs normal except hypoxia and tachypnea   Physical Exam  Constitutional: He is oriented to person, place, and time. He appears well-developed and well-nourished.  Non-toxic appearance. He does not appear ill. No distress.  HENT:  Head: Normocephalic and atraumatic.  Right Ear: External ear normal.  Left Ear: External ear normal.   Nose: Nose normal. No mucosal edema or rhinorrhea.  Mouth/Throat: Oropharynx is clear and moist and mucous membranes are normal. No dental abscesses or edematous.  Eyes: Conjunctivae and EOM are normal. Pupils are equal, round, and reactive to light.  Neck: Normal range of motion and full passive range of motion without pain. Neck supple.  Cardiovascular: Normal rate, regular rhythm and normal heart sounds.  Exam reveals no gallop and no friction rub.   No murmur heard. Pulmonary/Chest: Tachypnea noted. He is in respiratory distress. He has wheezes. He has rhonchi. He has no rales. He exhibits no tenderness and no crepitus.  Diffused wheezing and rhonchi  pursed lip breathing  Tachypnea   Abdominal: Soft. Normal appearance and bowel sounds are normal. He exhibits no distension. There is no tenderness. There is no rebound and no guarding.  Musculoskeletal: Normal range of motion. He exhibits no edema and no tenderness.  Moves all extremities well.   Neurological: He is alert and oriented to person, place, and time. He has normal strength. No cranial nerve deficit.  Skin: Skin is warm, dry and intact. No rash noted. No erythema.  Pale skin  Psychiatric: He has a normal mood and affect. His speech is normal and behavior is normal. His mood appears not anxious.    ED Course  Procedures (including critical care time) DIAGNOSTIC STUDIES: Oxygen Saturation is 87% on room air,  low by my interpretation.    Medications  furosemide (LASIX) injection 80 mg (80 mg Intravenous Given 08/12/12 1811)  nitroGLYCERIN (NITROGLYN) 2 % ointment 1 inch (1 inch Topical Given 08/12/12  1811)  albuterol (PROVENTIL) (5 MG/ML) 0.5% nebulizer solution 5 mg (5 mg Nebulization Given 08/12/12 1816)  ipratropium (ATROVENT) nebulizer solution 0.5 mg (0.5 mg Nebulization Given 08/12/12 1816)  albuterol (PROVENTIL) (5 MG/ML) 0.5% nebulizer solution 5 mg (5 mg Nebulization Given 08/12/12 2041)  ipratropium (ATROVENT)  nebulizer solution 0.5 mg (0.5 mg Nebulization Given 08/12/12 2042)      COORDINATION OF CARE: .5:57 PM Discussed ED treatment with pt and pt agrees.   Due to the degree of respiratory distress and hypoxia on [place] patient was placed on BiPAP.  8:29 PM recheck: discussed labs with pt. Pt is currently on BIPAP. Has improved air movement and some low pitched rhonchi but wheezing is improved . Will order additional breathing tx. HR 81, BP 117/ 84, PO 96 % on BiPAP  22:00 pt requesting to come of BiPap will have respiratory supervise   22:24 Dr Orvan Falconer, admit to tele if able to tolerate being off BiPap (no stepdown beds)  10:34 PM-  Recheck after taking off BiPAP. Patient is currently on 50% Ventimask.: Pt's wheezing is more evident w/o BiPAP. PO 91 %, HR 78, BP 124/106  2310 Dr. Orvan Falconer has seen patient, he he states patient can go to the telemetry floor. Daughter states patient is looking much better.  ------------------------------------------------------------ Transthoracic Echocardiography  Patient: Marvelous, Bouwens MR #: [id number] Study Date: 08/06/2011 Gender: M Age: 51 Height: 188cm Weight: 114.3kg BSA: 2.13m^2 Pt. Status: Room: APA16A  ------------------------------------------------------------ LV EF: 55% - 60%  ------------------------------------------------------------ Indications: Pericardial effusion 423.9.  ------------------------------------------------------------ History: PMH: Atrial fibrillation. Aortic valve disease. Chronic obstructive pulmonary disease. PMH: Prostate cancer, hyperlipidemia, diastolic heart failure, former smoker Risk factors: Hypertension.  ------------------------------------------------------------ Study Conclusions  - Left ventricle: The cavity size was at the upper limits of normal. There was moderate concentric hypertrophy. Systolic function was normal. The estimated ejection fraction was in the range of 55% to 60%. Mild to  moderate hypokinesis of the anteroseptal myocardium. Inferoposterior region is hyperdynamic. - Aortic valve: Moderately calcified annulus. Trileaflet; mildly thickened, mildly calcified leaflets. Cusp separation was mildly reduced. There was mild stenosis. Trivial regurgitation. Valve area: 1.57cm^2(VTI). Valve area: 1.89cm^2 (Vmax). - Aorta: The aorta was moderately calcified. - Mitral valve: A bioprosthesis was present and functioning normally. Theleaflets had a normal range of motion. The sewing ring appeared normal. Valve area by pressure half-time: 1.67cm^2. Valve area by continuity equation (using LVOT flow): 1.38cm^2. - Left atrium: The atrium was moderately dilated. - Right ventricle: The cavity size was at the upper limits of normal. Wall thickness was mildly increased. - Right atrium: The atrium was mildly to moderately dilated. - Atrial septum: No defect or patent foramen ovale was identified. - Pulmonary arteries: Systolic pressure was moderately increased. PA peak pressure: 71mm Hg (S). Impressions:  - Compared to the prior study performed 10/24/10, there has been no significant interval change. ------------------------------------------------------------ Post procedure conclusions Ascending Aorta:  - The aorta was moderately calcified. ------------------------------------------------------------ Sharon Bing, MD [id number]:[id number]   Results for orders placed during the hospital encounter of 08/12/12  CBC WITH DIFFERENTIAL      Result Value Range   WBC 10.2  4.0 - 10.5 K/uL   RBC 3.64 (*) 4.22 - 5.81 MIL/uL   Hemoglobin 11.8 (*) 13.0 - 17.0 g/dL   HCT 86.5 (*) 78.4 - 69.6 %   MCV 103.0 (*) 78.0 - 100.0 fL   MCH 32.4  26.0 - 34.0 pg   MCHC 31.5  30.0 - 36.0 g/dL   RDW  18.7 (*) 11.5 - 15.5 %   Platelets 167  150 - 400 K/uL   Neutrophils Relative 79 (*) 43 - 77 %   Neutro Abs 8.1 (*) 1.7 - 7.7 K/uL   Lymphocytes Relative 9 (*) 12 - 46 %   Lymphs Abs  0.9  0.7 - 4.0 K/uL   Monocytes Relative 12  3 - 12 %   Monocytes Absolute 1.2 (*) 0.1 - 1.0 K/uL   Eosinophils Relative 1  0 - 5 %   Eosinophils Absolute 0.1  0.0 - 0.7 K/uL   Basophils Relative 0  0 - 1 %   Basophils Absolute 0.0  0.0 - 0.1 K/uL  BASIC METABOLIC PANEL      Result Value Range   Sodium 135  135 - 145 mEq/L   Potassium 4.1  3.5 - 5.1 mEq/L   Chloride 92 (*) 96 - 112 mEq/L   CO2 34 (*) 19 - 32 mEq/L   Glucose, Bld 145 (*) 70 - 99 mg/dL   BUN 26 (*) 6 - 23 mg/dL   Creatinine, Ser 1.61  0.50 - 1.35 mg/dL   Calcium 9.0  8.4 - 09.6 mg/dL   GFR calc non Af Amer 57 (*) >90 mL/min   GFR calc Af Amer 66 (*) >90 mL/min  TROPONIN I      Result Value Range   Troponin I <0.30  <0.30 ng/mL  PRO B NATRIURETIC PEPTIDE      Result Value Range   Pro B Natriuretic peptide (BNP) 5479.0 (*) 0 - 450 pg/mL   Laboratory interpretation all normal except stable anemia chronic, stable metabolic alkalosis, elevated BNP   Dg Chest Portable 1 View  08/12/2012  *RADIOLOGY REPORT*  Clinical Data: Shortness of breath.  PORTABLE CHEST - 1 VIEW  Comparison: 08/01/2012.  Findings: The heart is enlarged but stable.  Prominent mediastinal and hilar contours are unchanged.  Persistent vascular congestion and chronic scarring changes.  No definite infiltrates, effusions or edema.  IMPRESSION:  Stable cardiac enlargement with mild vascular congestion but no overt pulmonary edema.   Original Report Authenticated By: Rudie Meyer, M.D.       Date: 08/12/2012  Rate: 88  Rhythm: atrial fibrillation  QRS Axis: normal  Intervals: normal  ST/T Wave abnormalities: nonspecific T wave changes  Conduction Disutrbances:nonspecific intraventricular conduction delay  Narrative Interpretation: Q waves in inf/ant leads  Old EKG Reviewed: unchanged from 07/31/2012  Diagnoses that have been ruled out:  None  Diagnoses that are still under consideration:  None  Final diagnoses:  Respiratory failure  COPD  exacerbation  CHF (congestive heart failure)  Hypoxia  Anemia  Compensated metabolic alkalosis    Plan admission  CRITICAL CARE Performed by: Devoria Albe L   Total critical care time: 40 min   Critical care time was exclusive of separately billable procedures and treating other patients.  Critical care was necessary to treat or prevent imminent or life-threatening deterioration.  Critical care was time spent personally by me on the following activities: development of treatment plan with patient and/or surrogate as well as nursing, discussions with consultants, evaluation of patient's response to treatment, examination of patient, obtaining history from patient or surrogate, ordering and performing treatments and interventions, ordering and review of laboratory studies, ordering and review of radiographic studies, pulse oximetry and re-evaluation of patient's condition.     MDM   I personally performed the services described in this documentation, which was scribed in my presence. The recorded information has been  reviewed and considered.  Devoria Albe, MD, Armando Gang        Ward Givens, MD 08/12/12 [phone]

## 2012-08-12 NOTE — ED Notes (Signed)
C/o sob that started yesterday. Pt recently dc home from hospital last week. Breathing labored upon ems arrival 4L 02 were at 86%

## 2012-08-12 NOTE — ED Notes (Signed)
Called lab on the pending BNP.

## 2012-08-13 ENCOUNTER — Encounter (HOSPITAL_COMMUNITY): Payer: Self-pay | Admitting: Internal Medicine

## 2012-08-13 DIAGNOSIS — J9620 Acute and chronic respiratory failure, unspecified whether with hypoxia or hypercapnia: Secondary | ICD-10-CM | POA: Diagnosis present

## 2012-08-13 DIAGNOSIS — I359 Nonrheumatic aortic valve disorder, unspecified: Secondary | ICD-10-CM

## 2012-08-13 DIAGNOSIS — D509 Iron deficiency anemia, unspecified: Secondary | ICD-10-CM

## 2012-08-13 DIAGNOSIS — D7589 Other specified diseases of blood and blood-forming organs: Secondary | ICD-10-CM

## 2012-08-13 DIAGNOSIS — I509 Heart failure, unspecified: Secondary | ICD-10-CM | POA: Insufficient documentation

## 2012-08-13 HISTORY — DX: Other specified diseases of blood and blood-forming organs: D75.89

## 2012-08-13 LAB — BLOOD GAS, ARTERIAL
Acid-Base Excess: 9 mmol/L — ABNORMAL HIGH (ref 0.0–2.0)
Bicarbonate: 35 mEq/L — ABNORMAL HIGH (ref 20.0–24.0)
Delivery systems: POSITIVE
FIO2: 50 %
FIO2: 40 %
Patient temperature: 37
Patient temperature: 37
TCO2: 33.2 mmol/L (ref 0–100)
TCO2: 32.5 mmol/L (ref 0–100)
pCO2 arterial: 73.1 mmHg (ref 35.0–45.0)
pCO2 arterial: 68.8 mmHg (ref 35.0–45.0)
pH, Arterial: 7.309 — ABNORMAL LOW (ref 7.350–7.450)

## 2012-08-13 LAB — TSH: TSH: 3.506 u[IU]/mL (ref 0.350–4.500)

## 2012-08-13 LAB — MAGNESIUM: Magnesium: 2.1 mg/dL (ref 1.5–2.5)

## 2012-08-13 MED ORDER — GUAIFENESIN ER 600 MG PO TB12
1200.0000 mg | ORAL_TABLET | Freq: Two times a day (BID) | ORAL | Status: DC
  Administered 2012-08-13 – 2012-08-20 (×15): 1200 mg via ORAL
  Filled 2012-08-13 (×16): qty 2

## 2012-08-13 MED ORDER — BENZONATATE 100 MG PO CAPS
100.0000 mg | ORAL_CAPSULE | Freq: Four times a day (QID) | ORAL | Status: DC | PRN
  Administered 2012-08-14: 100 mg via ORAL
  Filled 2012-08-13: qty 1

## 2012-08-13 MED ORDER — METHYLPREDNISOLONE SODIUM SUCC 125 MG IJ SOLR
80.0000 mg | Freq: Four times a day (QID) | INTRAMUSCULAR | Status: DC
  Administered 2012-08-13 – 2012-08-15 (×7): 80 mg via INTRAVENOUS
  Filled 2012-08-13 (×7): qty 2

## 2012-08-13 MED ORDER — IPRATROPIUM BROMIDE 0.02 % IN SOLN
0.5000 mg | RESPIRATORY_TRACT | Status: DC
  Administered 2012-08-13 – 2012-08-20 (×36): 0.5 mg via RESPIRATORY_TRACT
  Filled 2012-08-13 (×39): qty 2.5

## 2012-08-13 MED ORDER — SODIUM CHLORIDE 0.9 % IJ SOLN: 3.0000 mL | INTRAMUSCULAR | Status: DC | PRN

## 2012-08-13 MED ORDER — SIMVASTATIN 20 MG PO TABS: 20.0000 mg | ORAL_TABLET | Freq: Every day | ORAL | Status: DC

## 2012-08-13 MED ORDER — ATORVASTATIN CALCIUM 10 MG PO TABS
10.0000 mg | ORAL_TABLET | Freq: Every day | ORAL | Status: DC
  Administered 2012-08-14 – 2012-08-19 (×5): 10 mg via ORAL
  Filled 2012-08-13 (×5): qty 1

## 2012-08-13 MED ORDER — TAMSULOSIN HCL 0.4 MG PO CAPS: 0.4000 mg | ORAL_CAPSULE | Freq: Every day | ORAL | Status: DC

## 2012-08-13 MED ORDER — TAMSULOSIN HCL 0.4 MG PO CAPS
0.4000 mg | ORAL_CAPSULE | Freq: Every morning | ORAL | Status: DC
  Administered 2012-08-13 – 2012-08-20 (×7): 0.4 mg via ORAL
  Filled 2012-08-13 (×8): qty 1

## 2012-08-13 MED ORDER — LEVOFLOXACIN IN D5W 750 MG/150ML IV SOLN
750.0000 mg | INTRAVENOUS | Status: DC
  Administered 2012-08-13 – 2012-08-15 (×3): 750 mg via INTRAVENOUS
  Filled 2012-08-13 (×4): qty 150

## 2012-08-13 MED ORDER — DEXTROSE 5 % IV SOLN
1.0000 g | Freq: Two times a day (BID) | INTRAVENOUS | Status: DC
  Administered 2012-08-13 – 2012-08-19 (×11): 1 g via INTRAVENOUS
  Filled 2012-08-13 (×14): qty 1

## 2012-08-13 MED ORDER — MORPHINE SULFATE 2 MG/ML IJ SOLN
1.0000 mg | INTRAMUSCULAR | Status: DC | PRN
  Administered 2012-08-13: 1 mg via INTRAVENOUS
  Filled 2012-08-13: qty 1

## 2012-08-13 MED ORDER — ALBUTEROL SULFATE (5 MG/ML) 0.5% IN NEBU: 5.0000 mg | INHALATION_SOLUTION | RESPIRATORY_TRACT | Status: DC | PRN

## 2012-08-13 MED ORDER — ONDANSETRON HCL 4 MG/2ML IJ SOLN: 4.0000 mg | INTRAMUSCULAR | Status: DC | PRN

## 2012-08-13 MED ORDER — POTASSIUM CHLORIDE CRYS ER 20 MEQ PO TBCR
40.0000 meq | EXTENDED_RELEASE_TABLET | Freq: Two times a day (BID) | ORAL | Status: DC
  Administered 2012-08-13 – 2012-08-14 (×4): 40 meq via ORAL
  Filled 2012-08-13 (×4): qty 2

## 2012-08-13 MED ORDER — NON FORMULARY: 1.0000 "application " | Freq: Two times a day (BID) | Status: DC | PRN

## 2012-08-13 MED ORDER — DILTIAZEM HCL 60 MG PO TABS
120.0000 mg | ORAL_TABLET | Freq: Four times a day (QID) | ORAL | Status: DC
  Administered 2012-08-13 – 2012-08-17 (×15): 120 mg via ORAL
  Filled 2012-08-13 (×5): qty 2
  Filled 2012-08-13: qty 1
  Filled 2012-08-13 (×10): qty 2

## 2012-08-13 MED ORDER — PANTOPRAZOLE SODIUM 40 MG PO TBEC
80.0000 mg | DELAYED_RELEASE_TABLET | Freq: Every day | ORAL | Status: DC
  Administered 2012-08-13 – 2012-08-20 (×7): 80 mg via ORAL
  Filled 2012-08-13: qty 2
  Filled 2012-08-13: qty 1
  Filled 2012-08-13 (×3): qty 2
  Filled 2012-08-13: qty 1
  Filled 2012-08-13 (×3): qty 2

## 2012-08-13 MED ORDER — NYSTATIN 100000 UNIT/ML MT SUSP
500000.0000 [IU] | Freq: Three times a day (TID) | OROMUCOSAL | Status: DC
  Administered 2012-08-13 – 2012-08-20 (×24): 500000 [IU] via ORAL
  Filled 2012-08-13 (×19): qty 5
  Filled 2012-08-13: qty 10
  Filled 2012-08-13 (×4): qty 5

## 2012-08-13 MED ORDER — ALBUTEROL SULFATE (5 MG/ML) 0.5% IN NEBU
2.5000 mg | INHALATION_SOLUTION | RESPIRATORY_TRACT | Status: DC
Start: 2012-08-13 — End: 2012-08-20
  Administered 2012-08-13 – 2012-08-20 (×36): 2.5 mg via RESPIRATORY_TRACT
  Filled 2012-08-13 (×39): qty 0.5

## 2012-08-13 MED ORDER — FUROSEMIDE 10 MG/ML IJ SOLN
60.0000 mg | Freq: Two times a day (BID) | INTRAMUSCULAR | Status: DC
  Administered 2012-08-13 – 2012-08-14 (×3): 60 mg via INTRAVENOUS
  Filled 2012-08-13 (×3): qty 6

## 2012-08-13 MED ORDER — MOMETASONE FURO-FORMOTEROL FUM 100-5 MCG/ACT IN AERO
2.0000 | INHALATION_SPRAY | Freq: Two times a day (BID) | RESPIRATORY_TRACT | Status: DC
  Administered 2012-08-14 – 2012-08-20 (×9): 2 via RESPIRATORY_TRACT
  Filled 2012-08-13: qty 8.8

## 2012-08-13 MED ORDER — ACETAMINOPHEN 325 MG PO TABS
650.0000 mg | ORAL_TABLET | ORAL | Status: DC | PRN
  Administered 2012-08-15 – 2012-08-19 (×3): 650 mg via ORAL
  Filled 2012-08-13 (×3): qty 2

## 2012-08-13 NOTE — Progress Notes (Signed)
Dr Orvan Falconer notified of EKG results.Will continue to monitor patient.

## 2012-08-13 NOTE — Progress Notes (Signed)
*  PRELIMINARY RESULTS* Echocardiogram 2D Echocardiogram has been performed.  Conrad Chambersburg 08/13/2012, 5:56 PM

## 2012-08-13 NOTE — Progress Notes (Signed)
Notified Dr. Sherrie Mustache of pts elevated CO2. Order received for BiPAP therapy, will transfer to stepdown.  1600: Bed available in Stepdown, report called to [place], Charity fundraiser. Pt transported, his family notified. [name]

## 2012-08-13 NOTE — Evaluation (Signed)
Physical Therapy Evaluation Patient Details Name: [name] MRN: [id number] DOB: 1922/03/29 Today's Date: 08/13/2012 Time: 1478-2956 PT Time Calculation (min): 33 min  PT Assessment / Plan / Recommendation Clinical Impression  Pt is very lethargic at time of my visit but is responsive and appropriate.  He had not done well at home recently after leaving the [facility].   Currently, his respiratory status was too compromised for me to try to mobilize him OOB.  Very gently bed exercise decreased O2 sat to 89.  RT came at the time iI was leaving to place him on BiPap.  I would again recommend SNF at d/c.    PT Assessment  Patient needs continued PT services    Follow Up Recommendations  SNF    Does the patient have the potential to tolerate intense rehabilitation      Barriers to Discharge Decreased caregiver support      Equipment Recommendations  Hospital bed    Recommendations for Other Services OT consult   Frequency Min 3X/week    Precautions / Restrictions Precautions Precautions: Fall Restrictions Weight Bearing Restrictions: No   Pertinent Vitals/Pain       Mobility  Bed Mobility Bed Mobility: Not assessed Transfers Transfers: Not assessed    Exercises General Exercises - Upper Extremity Shoulder Flexion: AROM;Both;5 reps;Supine General Exercises - Lower Extremity Ankle Circles/Pumps: AROM;Both;10 reps;Supine Short Arc Quad: AROM;Both;10 reps;Supine Heel Slides: AROM;Both;10 reps;Supine Hip ABduction/ADduction: AROM;Both;10 reps;Supine   PT Diagnosis: Difficulty walking;Generalized weakness  PT Problem List: Decreased strength;Decreased activity tolerance;Decreased mobility;Cardiopulmonary status limiting activity PT Treatment Interventions: DME instruction;Gait training;Functional mobility training;Therapeutic activities;Therapeutic exercise;Patient/family education   PT Goals Acute Rehab PT Goals PT Goal Formulation: With patient Time For Goal  Achievement: 08/27/12 Pt will go Supine/Side to Sit: with mod assist;with HOB not 0 degrees (comment degree) PT Goal: Supine/Side to Sit - Progress: Goal set today Pt will go Sit to Supine/Side: with mod assist;with HOB not 0 degrees (comment degree) PT Goal: Sit to Supine/Side - Progress: Goal set today Pt will go Sit to Stand: with mod assist;with upper extremity assist PT Goal: Sit to Stand - Progress: Goal set today Pt will go Stand to Sit: with min assist;with upper extremity assist PT Goal: Stand to Sit - Progress: Goal set today Pt will Ambulate: 1 - 15 feet;with min assist;with rolling walker PT Goal: Ambulate - Progress: Goal set today  Visit Information  Last PT Received On: 08/13/12    Subjective Data  Subjective: no c/o Patient Stated Goal: none stated   Prior Functioning       Cognition  Cognition Overall Cognitive Status: Difficult to assess Difficult to assess due to: Level of arousal Arousal/Alertness: Lethargic Orientation Level: Appears intact for tasks assessed Behavior During Session: Lethargic Cognition - Other Comments: pt is responsive but very lethargic...CO2 level is elevated per RT and at end of my visit he was placed on BiPap    Extremity/Trunk Assessment Right Upper Extremity Assessment RUE ROM/Strength/Tone: Deficits RUE ROM/Strength/Tone Deficits: 3/5 strength Left Upper Extremity Assessment LUE ROM/Strength/Tone: Deficits LUE ROM/Strength/Tone Deficits: 3/5 strength Right Lower Extremity Assessment RLE ROM/Strength/Tone: Deficits RLE ROM/Strength/Tone Deficits: 3/5 strength Left Lower Extremity Assessment LLE ROM/Strength/Tone: Deficits LLE ROM/Strength/Tone Deficits: 3/5 strength   Balance Balance Balance Assessed: No  End of Session PT - End of Session Equipment Utilized During Treatment: Oxygen Activity Tolerance: Patient limited by fatigue Patient left: in bed;with call bell/phone within reach;with bed alarm set  GP     Konrad Penta  08/13/2012, 3:15 PM

## 2012-08-13 NOTE — H&P (Signed)
[facility] History and Physical  [name]  AVW:[id number]  DOB: 28-Jun-1921   DOA: 08/12/2012   PCP:   [name], MD   Chief Complaint:  Difficulty breathing worse since today  HPI: [name] is an 79 y.o. male.   Obese Caucasian, with a history, multiple medical problems including, recent leaking AAA, s/p repair, COPD, atrial fibrillation, diastolic heart failure, recently discharged to home from the hospitalist service, after treatment for decompensated diastolic heart failure. The patient was stable at discharge, but because no beds were available at [facility], where he was admitted, he elected to go home. He had to be assisted into his home by EMS, because of fall and weakness at his door, and since being home has been noncompliant with dietary regimen, eating salami and such , has been having progressive swelling and progressive shortness of breath. He deteriorated to the point that he was brought to the emergency room by EMS today, required BiPAP, Lasix, and nebulizers for stabilization in the emergency room, and the hospitalist service was called to assist.  His daughter who accompanies him notes that he frequently stops breathing during his sleep, and she notes his oxygen saturation drops into the 80's. He snores heavily and has daytime somnolence. Usually uses 2 L of oxygen at home  After BiPAP, Lasix, and nebs, daughter says he is now 200% better, but not well enough to go   Rewiew of Systems:   All systems negative except as marked bold or noted in the HPI;  Constitutional:    malaise, fever and chills. ;  Eyes:   eye pain, redness and discharge. ;  ENMT:   ear pain, hoarseness, nasal congestion, sinus pressure and sore throat. ;  Cardiovascular:    chest pain, palpitations, diaphoresis, dyspnea and peripheral edema.  Respiratory:   cough, hemoptysis, wheezing and stridor. ;  Gastrointestinal:  nausea, vomiting, diarrhea, constipation, abdominal  pain, melena, blood in stool, hematemesis, jaundice and rectal bleeding. unusual weight loss..   Genitourinary:    frequency, dysuria, incontinence,flank pain and hematuria; Musculoskeletal:   back pain and neck pain.  swelling and trauma.;  Skin: .  pruritus, rash, abrasions, bruising and skin lesion.; ulcerations Neuro:    headache, lightheadedness and neck stiffness.  weakness, altered level of consciousness, altered mental status, extremity weakness, burning feet, involuntary movement, seizure and syncope.  Psych:    anxiety, depression, insomnia, tearfulness, panic attacks, hallucinations, paranoia, suicidal or homicidal ideation    Past Medical History  Diagnosis Date  . Mitral valve prolapse 2003    MVR in 2003  . Obesity   . Osteoarthritis   . Hypertension   . Hyperlipidemia   . COPD (chronic obstructive pulmonary disease)   . CHF (congestive heart failure) diastolic 10/2010    CXR in 12/2010: Prior CABG, cardiomegaly, vascular redistribution, bibasilar atelectasis, small effusions  . Pneumonia   . Impaired glucose tolerance   . Atrial fibrillation, chronic   . Prostate cancer     elevated PSA  . GERD (gastroesophageal reflux disease)     + hiatal hernia  . Arteriosclerotic cardiovascular disease (ASCVD) 1994    stent to RCA; CABG-2003  . Hard of hearing   . Anemia, iron deficiency   . AAA (abdominal aortic aneurysm)     Fusiform; infrarenal; 4-4.1cm on CT in 07/2008 and 12/2010 by MRI  . Gastric ulcer 2004    2004; upper GI bleed  . Renal cysts, acquired, bilateral     Complex  by MRI in 12/2010  . Adenomatous polyps 04/07/2012  . HYPERTENSION 12/17/2007    Subsequently hypotensive and all antihypertensive medication discontinued Lab  03/2012: Mild anemia with H&H-11.8/37.5, MCV-97, ferritin-224, normal CMet ex G-124, alb-3.4   . NSTEMI (non-ST elevated myocardial infarction) 01/18/2011  . Diabetes mellitus, type II 07/13/12    family denies patient is diabetic    Past  Surgical History  Procedure Laterality Date  . Knee surgery      Left  . Bladder surgery    . Femoral artery stent  12-06-10    Left SFA  . Appendectomy    . Mitral valve replacement (mvr)/coronary artery bypass grafting (cabg)  2003    stent to RCA in 1994  . Eye surgery  2007    bilateral cataracts  . Tonsillectomy      thinks they were removed while in the [employer]  . Mass excision  06/07/2011    Procedure: EXCISION MASS;  Surgeon: Fabio Bering;  Location: [facility];  Service: General;  Laterality: N/A;  excision of 2 masses back and buttocks  . Back surgery    . Esophagogastroduodenoscopy  03/03/2003    Large, deep prepyloric ulcer, as described above without bleeding/ Normal esophagus  . Esophagogastroduodenoscopy  09/03/2004    normal throughout  . Colonoscopy  09/03/2004    small ulcer, without stigmata of bleeding  . Esophagogastroduodenoscopy  11/14/2004    Normal esophagus and small hiatal hernia, otherwise normal stomach   . Colonoscopy  11/14/2004    Internal hemorrhoids, otherwise normal rectum/ left-sided diverticula , diffusely oozing right colon mucosa without a discrete lesion amenable to endoscopic therapy. 2 diminutive polyps. FELT TO HAVE AVMs/telangiectasias  . Colonoscopy  01/29/2005    Normal rectum  . Colonoscopy  03/11/2012    Colonic diverticulosis. Colonic polyps-removed as described above. Vascular anomalies in the cecum likely representing hemangiomas. Status post hemostasis clipping of  2 of the 3. ADENOMATOUS POLYPS. Repeat 2016  . Esophagogastroduodenoscopy  03/11/2012    Deformity of the antrum;  small polyp in antrum-not manipulated. Otherwise normal exam    Medications:  HOME MEDS: Prior to Admission medications   Medication Sig Start Date End Date Taking? Authorizing Provider  albuterol (PROVENTIL) (2.5 MG/3ML) 0.083% nebulizer solution Take 3 mLs (2.5 mg total) by nebulization every 6 (six) hours as needed. Mixes with the Ipratropium(for  shortness of breath) 07/02/12  Yes Kerri Perches, MD  benzonatate (TESSALON) 100 MG capsule Take 100 mg by mouth every 6 (six) hours as needed for cough.   Yes Historical Provider, MD  diltiazem (CARDIZEM) 120 MG tablet Take 1 tablet (120 mg total) by mouth 4 (four) times daily. 07/15/12  Yes Christiane Ha, MD  Fluticasone-Salmeterol (ADVAIR) 100-50 MCG/DOSE AEPB Inhale 1 puff into the lungs every 12 (twelve) hours.   Yes Historical Provider, MD  furosemide (LASIX) 20 MG tablet Take 60 mg by mouth every morning.    Yes Historical Provider, MD  guaiFENesin (MUCINEX) 600 MG 12 hr tablet Take 600 mg by mouth 2 (two) times daily as needed. congestion   Yes Historical Provider, MD  ipratropium (ATROVENT) 0.02 % nebulizer solution Take 500 mcg by nebulization every 6 (six) hours as needed. Shortness of breath   Yes Historical Provider, MD  lovastatin (MEVACOR) 40 MG tablet Take 40 mg by mouth at bedtime. Only takes on Sun, Mon, Wed, and Fri.   Yes Historical Provider, MD  Multiple Vitamin (MULTIVITAMIN WITH MINERALS) TABS Take 1 tablet by  mouth daily.   Yes Historical Provider, MD  nystatin (MYCOSTATIN) 100000 UNIT/ML suspension Take 500,000 Units by mouth 4 (four) times daily.   Yes Historical Provider, MD  omeprazole (PRILOSEC) 40 MG capsule Take 40 mg by mouth every morning.    Yes Historical Provider, MD  potassium chloride SA (K-DUR,KLOR-CON) 20 MEQ tablet Take 20 mEq by mouth 2 (two) times daily.   Yes Historical Provider, MD  Tamsulosin HCl (FLOMAX) 0.4 MG CAPS Take 0.4 mg by mouth every morning. 07/15/12  Yes Christiane Ha, MD  oxymetazoline (AFRIN) 0.05 % nasal spray Place 1 spray into the nose daily as needed. Congestion    Historical Provider, MD     Allergies:  Allergies  Allergen Reactions  . Gabapentin     Patient cannot take over 200 mg dose at a time as it causes jerking or spasms    Social History:   reports that he quit smoking about 5 years ago. His smoking use  included Cigarettes. He has a 70 pack-year smoking history. He has quit using smokeless tobacco. He reports that he does not drink alcohol or use illicit drugs.  Family History: Family History  Problem Relation Age of Onset  . Stroke Mother   . Stroke Brother   . Heart attack Brother   . Cancer Brother   . Cancer Brother   . Anesthesia problems Neg Hx   . Hypotension Neg Hx   . Malignant hyperthermia Neg Hx   . Pseudochol deficiency Neg Hx      Physical Exam: Filed Vitals:   08/12/12 2254 08/12/12 2300 08/12/12 2355 08/13/12 0021  BP:  118/66  97/50  Pulse:  82  91  Temp:   97.7 F (36.5 C) 97.7 F (36.5 C)  TempSrc:   Oral Oral  Resp:  27  26  Height:    6\' 3"  (1.905 m)  Weight:    103.4 kg (227 lb 15.3 oz)  SpO2: 93% 96%  93%   Blood pressure 97/50, pulse 91, temperature 97.7 F (36.5 C), temperature source Oral, resp. rate 26, height 6\' 3"  (1.905 m), weight 103.4 kg (227 lb 15.3 oz), SpO2 93.00%.  GEN:  Obese elderly Caucasian gentleman reclining in the stretcher tachypneic on 50% oxygen; cooperative with exam PSYCH:  alert and oriented 4; does not talk; affect is appropriate. HEENT: Mucous membranes pink and anicteric; PERRLA; EOM intact; thick neck; no carotid bruit; distended JVD; Breasts:: Not examined CHEST WALL: No tenderness CHEST: Normal respiration, clear to auscultation bilaterally HEART: Regular rate and rhythm; no murmurs rubs or gallops BACK: No kyphosis or scoliosis; no CVA tenderness ABDOMEN: Obese, soft non-tender; no masses, no organomegaly, normal abdominal bowel sounds; no pannus; no intertriginous candida. Rectal Exam: Not done EXTREMITIES: No bone or joint deformity; age-appropriate arthropathy of the hands and knees; no edema; no ulcerations. Genitalia: not examined PULSES: 2+ and symmetric SKIN: Normal hydration no rash or ulceration CNS: Cranial nerves 2-12 grossly intact no focal lateralizing neurologic deficit   Labs on Admission:  Basic  Metabolic Panel:  Recent Labs Lab 08/12/12 1755  NA 135  K 4.1  CL 92*  CO2 34*  GLUCOSE 145*  BUN 26*  CREATININE 1.11  CALCIUM 9.0   Liver Function Tests: No results found for this basename: AST, ALT, ALKPHOS, BILITOT, PROT, ALBUMIN,  in the last 168 hours No results found for this basename: LIPASE, AMYLASE,  in the last 168 hours No results found for this basename: AMMONIA,  in the  last 168 hours CBC:  Recent Labs Lab 08/12/12 1755  WBC 10.2  NEUTROABS 8.1*  HGB 11.8*  HCT 37.5*  MCV 103.0*  PLT 167   Cardiac Enzymes:  Recent Labs Lab 08/12/12 1755  TROPONINI <0.30   BNP: No components found with this basename: POCBNP,  D-dimer: No components found with this basename: D-DIMER,  CBG: No results found for this basename: GLUCAP,  in the last 168 hours  Radiological Exams on Admission: Dg Chest Portable 1 View  08/12/2012  *RADIOLOGY REPORT*  Clinical Data: Shortness of breath.  PORTABLE CHEST - 1 VIEW  Comparison: 08/01/2012.  Findings: The heart is enlarged but stable.  Prominent mediastinal and hilar contours are unchanged.  Persistent vascular congestion and chronic scarring changes.  No definite infiltrates, effusions or edema.  IMPRESSION:  Stable cardiac enlargement with mild vascular congestion but no overt pulmonary edema.   Original Report Authenticated By: Rudie Meyer, M.D.      Assessment/Plan Present on Admission:   . Acute-on-chronic respiratory failure  We'll admit for respiratory support; currently he seems well enough to be managed in telemetry unit; will likely improve even further with more diuresis and nebulization  . Acute on chronic diastolic congestive heart failure  Continue Lasix diuresis; hold ACE because of blood pressure trending down.continue Cardizem because of A. fib  . COPD exacerbation  Continue nebulizations while awake, but will hold steroids for the time being most of the wheezing is likely due to heart failure Possible  obstructive sleep apnea  Will give nighttime CPAP while in hospital . Anemia     Other plans as per orders.  Code Status: FULL CODE  Family Communication: Daughter will lives with him present for the interview and evaluation and discussion of plan Disposition Plan: Would likely be best served by discharge back to a skilled nursing facility   Evelean Bigler Nocturnist [facility] Pager [phone]   08/13/2012, 1:55 AM

## 2012-08-13 NOTE — Progress Notes (Signed)
CRITICAL VALUE ALERT  Critical value received:  PCO2  Date of notification:  08/13/12   Time of notification:  1420  Critical value read back:yes  Nurse who received alert:  Arliss Journey  MD notified (1st page):  Fisher   Time of first page:  1430  MD notified (2nd page):  Time of second page:  Responding MD:  Sherrie Mustache  Time MD responded:  1430

## 2012-08-13 NOTE — Progress Notes (Signed)
UR Chart Review Completed  

## 2012-08-13 NOTE — Telephone Encounter (Signed)
Pt re admitted, I had attempted to spk with his daughter but no response

## 2012-08-13 NOTE — Progress Notes (Addendum)
[facility] PROGRESS NOTE  [name] WUJ:[id number] DOB: 02-Feb-1922 DOA: 08/12/2012 PCP: [name], MD  Assessment/Plan: . Acute-on-chronic respiratory failure :  Only slight improvement this am. Sats >90% on venti mask. Pt perceives some improvement. Will continue with IV lasix and nebs. We'll order ABG on oxygen further assessment.  . Acute on chronic diastolic congestive heart failure : last echo 2/13 yields EF 50-55%. Current volume status -250. Wt 103.4kg down from 104.3kg on admission. ProBNP 5479. Continue Lasix diuresis; Continue strict intake and output, daily weights.  Continue to hold ACE because of blood pressure trending down.  Marland Kitchen COPD exacerbation : See #1. Only faint wheezing and fair air flow. Continue nebulizations while awake. Will start gentle IV Solu-Medrol.   Afib: chronic.  rate controlled. Tele with frequent PVC's. Continue cardizem   Possible obstructive sleep apnea : continue  CPAP while in hospital   . Anemia : macrocytic. Also recent hx of retroperitoneal bleed where he received 4 units PRBC's. Currently Hg at baseline according to chart review. No s/sx active bleeding. Will monitor, but will order a vitamin B12 level and TSH for further evaluation.   Code Status: full Family Communication:  Disposition Plan: Feel pt would benefit from placement. Has refused in past. Will request PT eval and SW consult.    Consultants:  none  Procedures:  none  Antibiotics:  none  HPI/Subjective: Awake alert. Denies pain. States "feel like lungs have opened up a bit"  Objective: Filed Vitals:   08/13/12 0714 08/13/12 0813 08/13/12 0906 08/13/12 1059  BP:   122/69   Pulse:   86   Temp:      TempSrc:      Resp:      Height:      Weight:      SpO2: 93% 93% 90% 93%    Intake/Output Summary (Last 24 hours) at 08/13/12 1327 Last data filed at 08/12/12 2330  Gross per 24 hour  Intake      0 ml  Output    250 ml  Net   -250 ml   Filed  Weights   08/12/12 1727 08/13/12 0021  Weight: 104.327 kg (230 lb) 103.4 kg (227 lb 15.3 oz)    Exam:   General:  Awake alert somewhat ill appearing  Cardiovascular: irregularly irregular, No MGR No LEE +JVD  Respiratory: moderate increased work of breathing. Difficulty completing sentences, some use abdominal accessory muscles. BS with fair air flow faint wheeze and diffuse rhonchi.  Abdomen: obese soft +BS non-tender to palpation.   Data Reviewed: Basic Metabolic Panel:  Recent Labs Lab 08/12/12 1755 08/13/12 0547  NA 135  --   K 4.1  --   CL 92*  --   CO2 34*  --   GLUCOSE 145*  --   BUN 26*  --   CREATININE 1.11  --   CALCIUM 9.0  --   MG  --  2.1   Liver Function Tests: No results found for this basename: AST, ALT, ALKPHOS, BILITOT, PROT, ALBUMIN,  in the last 168 hours No results found for this basename: LIPASE, AMYLASE,  in the last 168 hours No results found for this basename: AMMONIA,  in the last 168 hours CBC:  Recent Labs Lab 08/12/12 1755  WBC 10.2  NEUTROABS 8.1*  HGB 11.8*  HCT 37.5*  MCV 103.0*  PLT 167   Cardiac Enzymes:  Recent Labs Lab 08/12/12 1755  TROPONINI <0.30   BNP (last 3 results)  Recent Labs  07/31/12 1357 08/03/12 0536 08/12/12 1755  PROBNP 3377.0* 2605.0* 5479.0*   CBG: No results found for this basename: GLUCAP,  in the last 168 hours  No results found for this or any previous visit (from the past 240 hour(s)).   Studies: Dg Chest Portable 1 View  08/12/2012  *RADIOLOGY REPORT*  Clinical Data: Shortness of breath.  PORTABLE CHEST - 1 VIEW  Comparison: 08/01/2012.  Findings: The heart is enlarged but stable.  Prominent mediastinal and hilar contours are unchanged.  Persistent vascular congestion and chronic scarring changes.  No definite infiltrates, effusions or edema.  IMPRESSION:  Stable cardiac enlargement with mild vascular congestion but no overt pulmonary edema.   Original Report Authenticated By: Rudie Meyer, M.D.     Scheduled Meds: . albuterol  2.5 mg Nebulization Q4H WA  . diltiazem  120 mg Oral Q6H  . furosemide  60 mg Intravenous BID  . guaiFENesin  1,200 mg Oral BID  . ipratropium  0.5 mg Nebulization Q4H WA  . mometasone-formoterol  2 puff Inhalation BID  . nystatin  500,000 Units Oral TID AC & HS  . pantoprazole  80 mg Oral Daily  . potassium chloride  40 mEq Oral BID  . simvastatin  20 mg Oral q1800  . Tamsulosin HCl  0.4 mg Oral q morning - 10a   Continuous Infusions:   Principal Problem:   Acute-on-chronic respiratory failure Active Problems:   COPD exacerbation   Anemia, iron deficiency   Acute on chronic diastolic congestive heart failure   Macrocytosis without anemia    Time spent: 40  minutes    Shaquitta Burbridge  [facility] Pager  If 8PM-8AM, please contact night-coverage at [email or web address], password [facility] 08/13/2012, 1:27 PM  LOS: 1 day      Attending Addendum #2:  The patient's ABG on 50% oxygen reveals a pH of 7.3, PCO2 of 73.1, and PO2 of 65. We'll start BiPAP and transfer him to the ICU. In addition to Solu-Medrol, will add broad-spectrum antibiotics empirically.    Attending addendum #1 The patient was seen and examined. He was discussed with NP, Ms. Vedia Coffer. The patient appears to have upper airway wheezing more than wheezing in his parenchyma, but he is requiring a venti mask to keep his oxygen saturations greater than 90% which is a little concerning. An ABG will be ordered. His pro BNP is greater than 5000, but he does not appear to be appreciably volume overloaded. For now, we'll continue Lasix diuresis today and reevaluate tomorrow as he may not require IV Lasix. His last 2-D echocardiogram was in February 2013, and therefore, we'll order another 2-D echocardiogram to assess for interval changes. Will add IV Solu-Medrol, but not at very high doses to see if it will lessen his bronchospasms. We'll also add potassium chloride  supplementation as I suspect that his serum potassium will decrease on IV Lasix.

## 2012-08-13 NOTE — Progress Notes (Signed)
Placed 30fr  foley catheter in patient,he tolerated well. No c/o pain or discomfort at this time.Strict I&O''s.

## 2012-08-14 ENCOUNTER — Inpatient Hospital Stay (HOSPITAL_COMMUNITY): Payer: Medicare Other

## 2012-08-14 DIAGNOSIS — R739 Hyperglycemia, unspecified: Secondary | ICD-10-CM | POA: Diagnosis present

## 2012-08-14 DIAGNOSIS — R7309 Other abnormal glucose: Secondary | ICD-10-CM

## 2012-08-14 DIAGNOSIS — J9602 Acute respiratory failure with hypercapnia: Secondary | ICD-10-CM | POA: Diagnosis present

## 2012-08-14 DIAGNOSIS — I7090 Unspecified atherosclerosis: Secondary | ICD-10-CM

## 2012-08-14 DIAGNOSIS — J9600 Acute respiratory failure, unspecified whether with hypoxia or hypercapnia: Secondary | ICD-10-CM

## 2012-08-14 DIAGNOSIS — I719 Aortic aneurysm of unspecified site, without rupture: Secondary | ICD-10-CM

## 2012-08-14 LAB — BASIC METABOLIC PANEL
BUN: 28 mg/dL — ABNORMAL HIGH (ref 6–23)
Creatinine, Ser: 0.97 mg/dL (ref 0.50–1.35)
GFR calc Af Amer: 83 mL/min — ABNORMAL LOW (ref >90)
GFR calc non Af Amer: 72 mL/min — ABNORMAL LOW (ref >90)

## 2012-08-14 LAB — BLOOD GAS, ARTERIAL
Bicarbonate: 35.7 mEq/L — ABNORMAL HIGH (ref 20.0–24.0)
Expiratory PAP: 6
FIO2: 40 %
Inspiratory PAP: 14
O2 Saturation: 94.2 %
Patient temperature: 37
pH, Arterial: 7.378 (ref 7.350–7.450)

## 2012-08-14 LAB — VITAMIN B12: Vitamin B-12: 609 pg/mL (ref 211–911)

## 2012-08-14 LAB — CBC
MCH: 31.8 pg (ref 26.0–34.0)
MCHC: 31.3 g/dL (ref 30.0–36.0)
RDW: 18.2 % — ABNORMAL HIGH (ref 11.5–15.5)

## 2012-08-14 LAB — GLUCOSE, CAPILLARY: Glucose-Capillary: 162 mg/dL — ABNORMAL HIGH (ref 70–99)

## 2012-08-14 MED ORDER — INSULIN GLARGINE 100 UNIT/ML ~~LOC~~ SOLN
10.0000 [IU] | Freq: Two times a day (BID) | SUBCUTANEOUS | Status: DC
  Administered 2012-08-14 (×2): 10 [IU] via SUBCUTANEOUS

## 2012-08-14 MED ORDER — FUROSEMIDE 10 MG/ML IJ SOLN
40.0000 mg | Freq: Two times a day (BID) | INTRAMUSCULAR | Status: DC
  Administered 2012-08-14 – 2012-08-15 (×2): 40 mg via INTRAVENOUS
  Filled 2012-08-14 (×2): qty 4

## 2012-08-14 MED ORDER — INSULIN ASPART 100 UNIT/ML ~~LOC~~ SOLN: 0.0000 [IU] | Freq: Every day | SUBCUTANEOUS | Status: DC

## 2012-08-14 MED ORDER — FUROSEMIDE 10 MG/ML IJ SOLN: 20.0000 mg | Freq: Two times a day (BID) | INTRAMUSCULAR | Status: DC

## 2012-08-14 MED ORDER — INSULIN ASPART 100 UNIT/ML ~~LOC~~ SOLN
0.0000 [IU] | Freq: Three times a day (TID) | SUBCUTANEOUS | Status: DC
  Administered 2012-08-14: 4 [IU] via SUBCUTANEOUS
  Administered 2012-08-14: 7 [IU] via SUBCUTANEOUS
  Administered 2012-08-14: 4 [IU] via SUBCUTANEOUS
  Administered 2012-08-15: 3 [IU] via SUBCUTANEOUS

## 2012-08-14 MED ORDER — NEURAGEN PAIN RELIEF EX CREA: 1.0000 "application " | TOPICAL_CREAM | Freq: Two times a day (BID) | CUTANEOUS | Status: AC | PRN

## 2012-08-14 NOTE — Care Management Note (Signed)
    Page 1 of 2   08/20/2012     1:16:04 PM   CARE MANAGEMENT NOTE 08/20/2012  Patient:  [name], [name]   Account Number:  [id number]  Date Initiated:  08/14/2012  Documentation initiated by:  Rosemary Holms  Subjective/Objective Assessment:   Pt admitted from home where he lives with his daughter [name]. CM spoke at length with [name] who wishes for him to go to SNF for rehab prior to returning home. Would like the [facility] and states he was unable to go there before because they     Action/Plan:   did not have a bed. She states she is aware of the copays she will need to pay. CM notified CSW regarding the above.   Anticipated DC Date:  08/20/2012   Anticipated DC Plan:  SKILLED NURSING FACILITY  In-house referral  Clinical Social Worker      DC Planning Services  CM consult      Choice offered to / List presented to:             Status of service:  Completed, signed off Medicare Important Message given?   (If response is "NO", the following Medicare IM given date fields will be blank) Date Medicare IM given:   Date Additional Medicare IM given:    Discharge Disposition:  SKILLED NURSING FACILITY  Per UR Regulation:    If discussed at Long Length of Stay Meetings, dates discussed:   08/18/2012  08/20/2012    Comments:  08/20/12 Rosemary Holms RN BSN CM Pt discharging to [facility] today  08/17/12 Rosemary Holms RN BSN CM Plans remain that pt will DC to SNF when stable. Not today.  08/14/12 Rosemary Holms RN BNS CM

## 2012-08-14 NOTE — Clinical Social Work Psychosocial (Signed)
    Clinical Social Work Department BRIEF PSYCHOSOCIAL ASSESSMENT 08/14/2012  Patient:  [name], [name]     Account Number:  [id number]     Admit date:  08/12/2012  Clinical Social Worker:  Santa Genera, CLINICAL SOCIAL WORKER  Date/Time:  08/14/2012 04:00 PM  Referred by:  Physician  Date Referred:  08/14/2012 Referred for  SNF Placement   Other Referral:   Interview type:  Patient Other interview type:   Also spoke w daughter on phone    PSYCHOSOCIAL DATA Living Status:  FAMILY Admitted from facility:   Level of care:   Primary support name:  Celestia Khat Primary support relationship to patient:  CHILD, ADULT Degree of support available:    CURRENT CONCERNS Current Concerns  Post-Acute Placement   Other Concerns:    SOCIAL WORK ASSESSMENT / PLAN CSW spoke w patient in ICU, patient asked that I talk w daughter.  Wants SNF placement, but did not want to talk at this time, says daughter handles all his affairs.  Called daughter, says she cannot physically handle his care at home in his current condition, wants period of short term rehab after which he would return home.  Daughter has cared for father for several years since his heart surgery and continues to be wiling to care for him after he has completed some rehab at [facility] level.  Last hospitalization, took father home because bed was not available at [facility], their preferred SNF.  Says father fell out of car and daughter realized that she could not manage his care.  Now wants him placed somewhere in [place].  Explained SNF placement process, provided SNF list in room, prepared documents to seek bed offers.  Will continue to work w family to find SNF placement.  Will keep family and patient updated.   Assessment/plan status:  Psychosocial Support/Ongoing Assessment of Needs Other assessment/ plan:   Information/referral to community resources:   SNF list    PATIENT'S/FAMILY'S RESPONSE TO PLAN OF CARE: Daughter and  patient appreciative of help w placement process.   Santa Genera, LCSW Clinical Social Worker [phone])

## 2012-08-14 NOTE — Clinical Social Work Placement (Signed)
    Clinical Social Work Department CLINICAL SOCIAL WORK PLACEMENT NOTE 08/14/2012  Patient:  [name], [name]  Account Number:  [id number] Admit date:  08/12/2012  Clinical Social Worker:  Santa Genera, CLINICAL SOCIAL WORKER  Date/time:  08/14/2012 04:00 PM  Clinical Social Work is seeking post-discharge placement for this patient at the following level of care:   SKILLED NURSING   (*CSW will update this form in Epic as items are completed)   08/14/2012  Patient/family provided with [facility] Department of Clinical Social Work's list of facilities offering this level of care within the geographic area requested by the patient (or if unable, by the patient's family).  08/14/2012  Patient/family informed of their freedom to choose among providers that offer the needed level of care, that participate in Medicare, Medicaid or managed care program needed by the patient, have an available bed and are willing to accept the patient.  08/14/2012  Patient/family informed of [facility]' ownership interest in [facility], as well as of the fact that they are under no obligation to receive care at this facility.  PASARR submitted to EDS on 08/14/2012 PASARR number received from EDS on 08/14/2012  FL2 transmitted to all facilities in geographic area requested by pt/family on  08/14/2012 FL2 transmitted to all facilities within larger geographic area on   Patient informed that his/her managed care company has contracts with or will negotiate with  certain facilities, including the following:     Patient/family informed of bed offers received:   Patient chooses bed at  Physician recommends and patient chooses bed at    Patient to be transferred to  on   Patient to be transferred to facility by   The following physician request were entered in Epic:   Additional Comments:  Santa Genera, LCSW Clinical Social Worker [phone])

## 2012-08-14 NOTE — Progress Notes (Signed)
PT Cancellation Note  Patient Details Name: [name] MRN: [id number] DOB: Nov 11, 1921   Cancelled Treatment:    Reason Eval/Treat Not Completed: Medical issues which prohibited therapy Pt has been transferred to ICU due to increased respiratory distress.  We will hold PT until medical status improves.  Konrad Penta 08/14/2012, 9:37 AM

## 2012-08-14 NOTE — Consult Note (Signed)
Patient ID: [name] MRN: [id number], DOB/AGE: 02-16-1922   Admit date: 08/12/2012 Date of Consult: @TODAY @  Primary Physician: [name], MD Primary Cardiologist: Rothbart   Problem List: Past Medical History  Diagnosis Date  . Mitral valve prolapse 2003    MVR in 2003  . Obesity   . Osteoarthritis   . Hypertension   . Hyperlipidemia   . COPD (chronic obstructive pulmonary disease)   . CHF (congestive heart failure) diastolic 10/2010    CXR in 12/2010: Prior CABG, cardiomegaly, vascular redistribution, bibasilar atelectasis, small effusions  . Pneumonia   . Impaired glucose tolerance   . Atrial fibrillation, chronic   . Prostate cancer     elevated PSA  . GERD (gastroesophageal reflux disease)     + hiatal hernia  . Arteriosclerotic cardiovascular disease (ASCVD) 1994    stent to RCA; CABG-2003  . Hard of hearing   . Anemia, iron deficiency   . AAA (abdominal aortic aneurysm)     Fusiform; infrarenal; 4-4.1cm on CT in 07/2008 and 12/2010 by MRI  . Gastric ulcer 2004    2004; upper GI bleed  . Renal cysts, acquired, bilateral     Complex by MRI in 12/2010  . Adenomatous polyps 04/07/2012  . HYPERTENSION 12/17/2007    Subsequently hypotensive and all antihypertensive medication discontinued Lab  03/2012: Mild anemia with H&H-11.8/37.5, MCV-97, ferritin-224, normal CMet ex G-124, alb-3.4   . NSTEMI (non-ST elevated myocardial infarction) 01/18/2011  . Diabetes mellitus, type II 07/13/12    family denies patient is diabetic  . Macrocytosis without anemia 08/13/2012    Past Surgical History  Procedure Laterality Date  . Knee surgery      Left  . Bladder surgery    . Femoral artery stent  12-06-10    Left SFA  . Appendectomy    . Mitral valve replacement (mvr)/coronary artery bypass grafting (cabg)  2003    stent to RCA in 1994  . Eye surgery  2007    bilateral cataracts  . Tonsillectomy      thinks they were removed while in the [employer]  . Mass excision   06/07/2011    Procedure: EXCISION MASS;  Surgeon: Fabio Bering;  Location: [facility];  Service: General;  Laterality: N/A;  excision of 2 masses back and buttocks  . Back surgery    . Esophagogastroduodenoscopy  03/03/2003    Large, deep prepyloric ulcer, as described above without bleeding/ Normal esophagus  . Esophagogastroduodenoscopy  09/03/2004    normal throughout  . Colonoscopy  09/03/2004    small ulcer, without stigmata of bleeding  . Esophagogastroduodenoscopy  11/14/2004    Normal esophagus and small hiatal hernia, otherwise normal stomach   . Colonoscopy  11/14/2004    Internal hemorrhoids, otherwise normal rectum/ left-sided diverticula , diffusely oozing right colon mucosa without a discrete lesion amenable to endoscopic therapy. 2 diminutive polyps. FELT TO HAVE AVMs/telangiectasias  . Colonoscopy  01/29/2005    Normal rectum  . Colonoscopy  03/11/2012    Colonic diverticulosis. Colonic polyps-removed as described above. Vascular anomalies in the cecum likely representing hemangiomas. Status post hemostasis clipping of  2 of the 3. ADENOMATOUS POLYPS. Repeat 2016  . Esophagogastroduodenoscopy  03/11/2012    Deformity of the antrum;  small polyp in antrum-not manipulated. Otherwise normal exam     Allergies:  Allergies  Allergen Reactions  . Gabapentin     Patient cannot take over 200 mg dose at a time as it causes jerking  or spasms    HPI:  Patient is an 79 yo with a history of CAD (s/p CABG 2003), MVR, afib, CHF (latest echo LVEF was 30 to 40%) and AS We are asked to see re CHF/CAD Patient was recently d/c'd from the hospital after admission for  CHF   Sent home as no beds at in [employer].   Patient's daughter says he has been very weak at home.  In bed throughout.  Over past week prior to admit noted noted  increased SOB, swelling  Brought back to hosp on 2/19 Patient denies CP  Says his breathing is better than on admit. Echo    Inpatient Medications:  .  albuterol  2.5 mg Nebulization Q4H WA  . atorvastatin  10 mg Oral q1800  . ceFEPime (MAXIPIME) IV  1 g Intravenous Q12H  . diltiazem  120 mg Oral Q6H  . furosemide  20 mg Intravenous BID  . guaiFENesin  1,200 mg Oral BID  . insulin aspart  0-20 Units Subcutaneous TID WC  . insulin aspart  0-5 Units Subcutaneous QHS  . insulin glargine  10 Units Subcutaneous BID  . ipratropium  0.5 mg Nebulization Q4H WA  . levofloxacin (LEVAQUIN) IV  750 mg Intravenous Q24H  . methylPREDNISolone (SOLU-MEDROL) injection  80 mg Intravenous Q6H  . mometasone-formoterol  2 puff Inhalation BID  . nystatin  500,000 Units Oral TID AC & HS  . pantoprazole  80 mg Oral Daily  . potassium chloride  40 mEq Oral BID  . Tamsulosin HCl  0.4 mg Oral q morning - 10a    Family History  Problem Relation Age of Onset  . Stroke Mother   . Stroke Brother   . Heart attack Brother   . Cancer Brother   . Cancer Brother   . Anesthesia problems Neg Hx   . Hypotension Neg Hx   . Malignant hyperthermia Neg Hx   . Pseudochol deficiency Neg Hx      History   Social History  . Marital Status: Widowed    Spouse Name: N/A    Number of Children: 4  . Years of Education: N/A   Occupational History  . Retired    Social History Main Topics  . Smoking status: Former Smoker -- 1.00 packs/day for 70 years    Types: Cigarettes    Quit date: 06/25/2007  . Smokeless tobacco: Former Neurosurgeon  . Alcohol Use: No  . Drug Use: No  . Sexually Active: Not Currently   Other Topics Concern  . Not on file   Social History Narrative   Has 3/4 children living.   1 son deceased at age 46 due to drunk driving.   Widow since 1966, lives with a daughter who is responsible for his care   Quit smoking in 2010, no alchol for over 10 years though he "loves it"   Retired from [employer], after 10 years in 1951. 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  Physical Exam: Filed Vitals:   08/14/12 0816  BP:   Pulse: 80  Temp:   Resp: 21    Intake/Output Summary (Last 24 hours) at 08/14/12 0916 Last data filed at 08/14/12 0528  Gross per 24 hour  Intake    370 ml  Output   2400 ml  Net  -2030 ml    General: Patinet in NAD  ON face mask  Sats 80s to 90s Head: Normocephalic, atraumatic, sclera non-icteric Neck: Neck full.  Difficult to assess JVP Lungs: Bilateral rhonchi and wheezes. Heart: Irreg rate and rhythm.   with S1 S2. No murmurs, rubs, or gallops appreciated. Abdomen: Soft, non-tender, non-distended with normoactive bowel sounds. No hepatomegaly. No rebound/guarding.   Extremities: No clubbing, cyanosis  Tr edema on L leg.  1+ PT  Neuro: Alert and oriented X 3. Moves all extremities spontaneously. Psych:  Responds to questions appropriately with a normal affect.  Tele:  Afib  80s.   Labs: Results for orders placed during the hospital encounter of 08/12/12 (from the past 24 hour(s))  VITAMIN B12     Status: None   Collection Time    08/13/12  1:41 PM      Result Value Range   Vitamin B-12 609  211 - 911 pg/mL  BLOOD GAS, ARTERIAL     Status: Abnormal   Collection Time    08/13/12  2:10 PM      Result Value Range   FIO2 50.00     Delivery systems OXYGEN MASK     pH, Arterial 7.309 (*) 7.350 - 7.450   pCO2 arterial 73.1 (*) 35.0 - 45.0 mmHg   pO2, Arterial 64.6 (*) 80.0 - 100.0 mmHg   Bicarbonate 35.6 (*) 20.0 - 24.0 mEq/L   TCO2 33.2  0 - 100 mmol/L   Acid-Base Excess 9.3 (*) 0.0 - 2.0 mmol/L   O2 Saturation 92.0     Patient temperature 37.0     Collection site RIGHT RADIAL     Drawn by COLLECTED BY RT     Sample type ARTERIAL     Allens test (pass/fail) PASS  PASS  BLOOD GAS, ARTERIAL     Status: Abnormal   Collection Time    08/13/12  8:00 PM      Result Value Range   FIO2 40.00     Delivery systems BILEVEL POSITIVE AIRWAY PRESSURE     Inspiratory PAP 14     Expiratory  PAP 6     pH, Arterial 7.327 (*) 7.350 - 7.450   pCO2 arterial 68.8 (*) 35.0 - 45.0 mmHg   pO2, Arterial 67.7 (*) 80.0 - 100.0 mmHg   Bicarbonate 35.0 (*) 20.0 - 24.0 mEq/L   TCO2 32.5  0 - 100 mmol/L   Acid-Base Excess 9.0 (*) 0.0 - 2.0 mmol/L   O2 Saturation 93.7     Patient temperature 37.0     Collection site LEFT RADIAL     Drawn by [id number]     Sample type ARTERIAL     Allens test (pass/fail) PASS  PASS  BASIC METABOLIC PANEL     Status: Abnormal   Collection Time    08/14/12  4:46 AM      Result Value Range   Sodium 140  135 - 145 mEq/L   Potassium 4.7  3.5 - 5.1 mEq/L   Chloride 96  96 - 112 mEq/L   CO2 35 (*) 19 - 32 mEq/L   Glucose, Bld 156 (*) 70 - 99 mg/dL   BUN 28 (*) 6 - 23 mg/dL   Creatinine, Ser 4.09  0.50 - 1.35 mg/dL   Calcium 9.0  8.4 - 81.1 mg/dL   GFR calc non Af Amer 72 (*) >90 mL/min   GFR calc Af Amer 83 (*) >90 mL/min  CBC  Status: Abnormal   Collection Time    08/14/12  4:46 AM      Result Value Range   WBC 5.7  4.0 - 10.5 K/uL   RBC 3.62 (*) 4.22 - 5.81 MIL/uL   Hemoglobin 11.5 (*) 13.0 - 17.0 g/dL   HCT 16.1 (*) 09.6 - 04.5 %   MCV 101.7 (*) 78.0 - 100.0 fL   MCH 31.8  26.0 - 34.0 pg   MCHC 31.3  30.0 - 36.0 g/dL   RDW 40.9 (*) 81.1 - 91.4 %   Platelets 175  150 - 400 K/uL  BLOOD GAS, ARTERIAL     Status: Abnormal   Collection Time    08/14/12  7:55 AM      Result Value Range   FIO2 40.00     Delivery systems BILEVEL POSITIVE AIRWAY PRESSURE     Inspiratory PAP 14.0     Expiratory PAP 6.0     pH, Arterial 7.378  7.350 - 7.450   pCO2 arterial 62.0 (*) 35.0 - 45.0 mmHg   pO2, Arterial 71.4 (*) 80.0 - 100.0 mmHg   Bicarbonate 35.7 (*) 20.0 - 24.0 mEq/L   TCO2 31.6  0 - 100 mmol/L   Acid-Base Excess 10.3 (*) 0.0 - 2.0 mmol/L   O2 Saturation 94.2     Patient temperature 37.0     Collection site LEFT RADIAL     Drawn by COLLECTED BY RT     Sample type ARTERIAL     Allens test (pass/fail) PASS  PASS  GLUCOSE, CAPILLARY     Status:  Abnormal   Collection Time    08/14/12  8:21 AM      Result Value Range   Glucose-Capillary 144 (*) 70 - 99 mg/dL    Radiology/Studies:Ct Abdomen Pelvis W Contrast  07/31/2012  *RADIOLOGY REPORT*  Clinical Data: Abdominal pain.  Recent aneurysm surgery and retroperitoneal hematoma.  CT ABDOMEN AND PELVIS WITH CONTRAST  Technique:  Multidetector CT imaging of the abdomen and pelvis was performed following the standard protocol during bolus administration of intravenous contrast.  Contrast: 50mL OMNIPAQUE IOHEXOL 300 MG/ML  SOLN, OMNIPAQUE IOHEXOL 300 MG/ML  SOLN 11/31/2014  Comparison: Multiple exams, including 07/24/2012  Findings: Small left and moderate right pleural effusions with associated passive atelectasis noted.  There is evidence of old granulomatous disease of the lung bases, four-chamber cardiomegaly, and mitral valve calcification.  Atherosclerosis noted.  Left upper quadrant cystic lesion unchanged.  Bilateral renal cysts, unchanged.  Dependent density in the gallbladder may represent stones.  No specific hepatic abnormality observed.  Hematoma inseparable from the psoas muscle noted tracking along the posterior renal fascia, with mixed density.  Size not appreciably changed.  This tracks down into the upper part of the pelvis.  Stable infrarenal abdominal aortic aneurysm.  Stable atherosclerosis.  No new hemorrhage observed.  Urinary bladder unremarkable.  Levoconvex lumbar scoliosis noted with rotary component.  Umbilical hernia contains adipose tissue, stable.  No dilated bowel observed.  Lumbar spondylosis results in multilevel foraminal impingement.  IMPRESSION:  1.  Overall stable size and appearance of left retroperitoneal hematoma. 2.  Small left and moderate right pleural effusions with passive atelectasis. 3.  Four-chamber cardiomegaly. 4.  Atherosclerosis. 5.  Bilateral renal cystic lesions, unchanged. 6.  Stable infrarenal abdominal aortic aneurysm. 7.  Small umbilical hernia  contains adipose tissue. 8.  Lumbar spondylosis with multilevel chronic foraminal impingement.   Original Report Authenticated By: Gaylyn Rong, M.D.    Dg Chest  Port 1 View  08/14/2012  *RADIOLOGY REPORT*  Clinical Data: Respiratory failure and COPD.  Diastolic dysfunction  PORTABLE CHEST - 1 VIEW  Comparison: 08/12/2012  Findings: Cardiac enlargement.  Pulmonary vascular congestion and mild edema has improved since the prior study.  There is mild atelectasis in the lung bases.  No significant effusion however of the right lung base has been clipped on the image.  IMPRESSION: Mild improvement in pulmonary vascular congestion and fluid overload.   Original Report Authenticated By: Janeece Riggers, M.D.    Dg Chest Portable 1 View  08/12/2012  *RADIOLOGY REPORT*  Clinical Data: Shortness of breath.  PORTABLE CHEST - 1 VIEW  Comparison: 08/01/2012.  Findings: The heart is enlarged but stable.  Prominent mediastinal and hilar contours are unchanged.  Persistent vascular congestion and chronic scarring changes.  No definite infiltrates, effusions or edema.  IMPRESSION:  Stable cardiac enlargement with mild vascular congestion but no overt pulmonary edema.   Original Report Authenticated By: Rudie Meyer, M.D.     EKG:  Atrial fibrillation.  88 bpm.  Poor R wave progression    ASSESSMENT AND PLAN:  Patient is a frail 79 yo with multiple medical problems.  Hx of COPD, CAD, AAA.   PResents with CHF.  Respiratory status remains marginal. ON exam HR is controlled.  Sats marginal  Volume status remains increased. I have reviewed echo.  Imaging was very difficult.  LV function and RV function appear depressed, cannot fully assess LVEF  I would continue diuresis  Would increase Lasix to 40 bid. Once resp improves would try low dose b blocker.   Not on ACE I for now but would try ARB (cozaar 25) when breathing improves  Follow I/Os  Check BNP in AM  2.  Aortic stenosis.   AV  It appears moderately narrow.     3.    Atrial fib.  Rates controlled.  Not on anticoag with history of hematoma.  4.  Retroperitoneal hematoma  5.  AAA.  Felt high risk for surgery  6. COPD  Severe.        Signed, Dietrich Pates 08/14/2012, 9:16 AM

## 2012-08-14 NOTE — Progress Notes (Signed)
[facility] PROGRESS NOTE  [name] ZOX:[id number] DOB: 22-Jan-1922 DOA: 08/12/2012 PCP: [name], MD  Assessment/Plan:  . Acute respiratory failure with hypercapnia : related to COPD exacerbation in setting of some fluid overload.  Improved this am after addition of bipap, steroids and antibiotics started yesterday afternoon. ABG's improved this am. Maintaining sats >90% on mask. Will continue solumedrol and levaquin and maxipime day #2. Will keep in ICU for close monitoring with prn bipap orders as indicated. Will continue nebs. Pt improved but still appears weak.   Acute on chronic respiratory failure: on home o2. See #1.   Marland Kitchen Acute on chronic diastolic congestive heart failure : last echo 2/13 yields EF 50-55%. Results 2decho pending.  Current volume status -2.1L. Continue Lasix diuresis at a lower dose; Continue strict intake and output, daily weights. Continue to hold ACE-I. SBP range 110-120.   Marland Kitchen COPD exacerbation :  Less wheezing and improved air flow this am. Continue solumedrol at current dose 1 more day. Continue nebulizations while awake. Levaquin day #2. Maxipime day #2.  Hyperglycemia: drug induced. Likely steroids. Will continue SSI for glycemic control. Anticipate resolving once steroids tapered.    Afib: chronic. rate controlled. Tele with frequent PVC's. Continue Cardizem. The patient says that he was taken off of aspirin and anticoagulants by his "doctors" but he could not tell me the reason why. He does have a history of upper GI bleeding.  Possible obstructive sleep apnea : See #1. Daughter indicates she and PCP in process of planning OP sleep study.    . Anemia : macrocytic.  MCV trending downward. B12 and TSH WNL. Hg at 11.5 which seems at higher end of his baseline.      Code Status: full Family Communication: daughter at bedside Disposition Plan: may need SNF but will likely wish to go home. Not medically ready yet.     Consultants:  none  Procedures:  none  Antibiotics:  levaquin 08/13/12>>>  Cefepime 08/13/2012>>  HPI/Subjective: Awake but somewhat drowsy. Just finished eating 100% breakfast. Denies pain/discomfort.   Objective: Filed Vitals:   08/14/12 0800 08/14/12 0816 08/14/12 0900 08/14/12 1000  BP: 117/62  100/55 105/58  Pulse: 80 80 80 75  Temp: 97.4 F (36.3 C)     TempSrc: Axillary     Resp: 17 21 23 17   Height:      Weight:      SpO2: 97% 90% 87% 90%    Intake/Output Summary (Last 24 hours) at 08/14/12 1104 Last data filed at 08/14/12 0855  Gross per 24 hour  Intake    730 ml  Output   2400 ml  Net  -1670 ml   Filed Weights   08/12/12 1727 08/13/12 0021 08/14/12 0525  Weight: 104.327 kg (230 lb) 103.4 kg (227 lb 15.3 oz) 104.9 kg (231 lb 4.2 oz)    Exam:   General:  Slightly pale ill appearing   Cardiovascular: irregularly irregular  Respiratory: normal effort at rest. BS with anterior expiratory wheeze, less rhonchi than yeasterday.   Abdomen: obese, +BS non-tender to palpation. Soft.  Extremities: Trace of pedal edema.  Neurologic: He is alert and oriented to himself, his daughter, and hospital.   Data Reviewed: Basic Metabolic Panel:  Recent Labs Lab 08/12/12 1755 08/13/12 0547 08/14/12 0446  NA 135  --  140  K 4.1  --  4.7  CL 92*  --  96  CO2 34*  --  35*  GLUCOSE 145*  --  156*  BUN 26*  --  28*  CREATININE 1.11  --  0.97  CALCIUM 9.0  --  9.0  MG  --  2.1  --    Liver Function Tests: No results found for this basename: AST, ALT, ALKPHOS, BILITOT, PROT, ALBUMIN,  in the last 168 hours No results found for this basename: LIPASE, AMYLASE,  in the last 168 hours No results found for this basename: AMMONIA,  in the last 168 hours CBC:  Recent Labs Lab 08/12/12 1755 08/14/12 0446  WBC 10.2 5.7  NEUTROABS 8.1*  --   HGB 11.8* 11.5*  HCT 37.5* 36.8*  MCV 103.0* 101.7*  PLT 167 175   Cardiac Enzymes:  Recent Labs Lab  08/12/12 1755  TROPONINI <0.30   BNP (last 3 results)  Recent Labs  07/31/12 1357 08/03/12 0536 08/12/12 1755  PROBNP 3377.0* 2605.0* 5479.0*   CBG:  Recent Labs Lab 08/14/12 0821  GLUCAP 144*    No results found for this or any previous visit (from the past 240 hour(s)).   Studies: Dg Chest Port 1 View  08/14/2012  *RADIOLOGY REPORT*  Clinical Data: Respiratory failure and COPD.  Diastolic dysfunction  PORTABLE CHEST - 1 VIEW  Comparison: 08/12/2012  Findings: Cardiac enlargement.  Pulmonary vascular congestion and mild edema has improved since the prior study.  There is mild atelectasis in the lung bases.  No significant effusion however of the right lung base has been clipped on the image.  IMPRESSION: Mild improvement in pulmonary vascular congestion and fluid overload.   Original Report Authenticated By: Janeece Riggers, M.D.    Dg Chest Portable 1 View  08/12/2012  *RADIOLOGY REPORT*  Clinical Data: Shortness of breath.  PORTABLE CHEST - 1 VIEW  Comparison: 08/01/2012.  Findings: The heart is enlarged but stable.  Prominent mediastinal and hilar contours are unchanged.  Persistent vascular congestion and chronic scarring changes.  No definite infiltrates, effusions or edema.  IMPRESSION:  Stable cardiac enlargement with mild vascular congestion but no overt pulmonary edema.   Original Report Authenticated By: Rudie Meyer, M.D.     Scheduled Meds: . albuterol  2.5 mg Nebulization Q4H WA  . atorvastatin  10 mg Oral q1800  . ceFEPime (MAXIPIME) IV  1 g Intravenous Q12H  . diltiazem  120 mg Oral Q6H  . furosemide  20 mg Intravenous BID  . guaiFENesin  1,200 mg Oral BID  . insulin aspart  0-20 Units Subcutaneous TID WC  . insulin aspart  0-5 Units Subcutaneous QHS  . insulin glargine  10 Units Subcutaneous BID  . ipratropium  0.5 mg Nebulization Q4H WA  . levofloxacin (LEVAQUIN) IV  750 mg Intravenous Q24H  . methylPREDNISolone (SOLU-MEDROL) injection  80 mg Intravenous Q6H   . mometasone-formoterol  2 puff Inhalation BID  . nystatin  500,000 Units Oral TID AC & HS  . pantoprazole  80 mg Oral Daily  . potassium chloride  40 mEq Oral BID  . Tamsulosin HCl  0.4 mg Oral q morning - 10a   Continuous Infusions:   Principal Problem:   Acute-on-chronic respiratory failure Active Problems:   COPD exacerbation   Anemia, iron deficiency   Atrial fibrillation, chronic   Acute on chronic diastolic congestive heart failure   Macrocytosis without anemia   Hyperglycemia, drug-induced   Acute respiratory failure with hypercapnia    Time spent: 40 minutes    Brittlyn Cloe  [facility]  If 8PM-8AM, please contact night-coverage at [email or web address], password [facility] 08/14/2012, 11:04 AM  LOS:  2 days     Attending: The patient was seen and examined. He was discussed with NP, Ms. Vedia Coffer. The above note has been annotated/amended accordingly.    Elliot Cousin, M.D.

## 2012-08-15 ENCOUNTER — Encounter (HOSPITAL_COMMUNITY): Payer: Self-pay | Admitting: Internal Medicine

## 2012-08-15 DIAGNOSIS — I2723 Pulmonary hypertension due to lung diseases and hypoxia: Secondary | ICD-10-CM

## 2012-08-15 DIAGNOSIS — T50904A Poisoning by unspecified drugs, medicaments and biological substances, undetermined, initial encounter: Secondary | ICD-10-CM

## 2012-08-15 DIAGNOSIS — I350 Nonrheumatic aortic (valve) stenosis: Secondary | ICD-10-CM

## 2012-08-15 DIAGNOSIS — J449 Chronic obstructive pulmonary disease, unspecified: Secondary | ICD-10-CM

## 2012-08-15 DIAGNOSIS — I071 Rheumatic tricuspid insufficiency: Secondary | ICD-10-CM

## 2012-08-15 DIAGNOSIS — I5021 Acute systolic (congestive) heart failure: Secondary | ICD-10-CM | POA: Diagnosis present

## 2012-08-15 HISTORY — DX: Chronic obstructive pulmonary disease, unspecified: J44.9

## 2012-08-15 HISTORY — DX: Nonrheumatic aortic (valve) stenosis: I35.0

## 2012-08-15 HISTORY — DX: Pulmonary hypertension due to lung diseases and hypoxia: I27.23

## 2012-08-15 HISTORY — DX: Rheumatic tricuspid insufficiency: I07.1

## 2012-08-15 HISTORY — DX: Acute systolic (congestive) heart failure: I50.21

## 2012-08-15 LAB — CBC
HCT: 34.2 % — ABNORMAL LOW (ref 39.0–52.0)
MCH: 32.4 pg (ref 26.0–34.0)
MCHC: 32.2 g/dL (ref 30.0–36.0)
MCV: 100.9 fL — ABNORMAL HIGH (ref 78.0–100.0)
Platelets: 201 10*3/uL (ref 150–400)
RDW: 18.2 % — ABNORMAL HIGH (ref 11.5–15.5)

## 2012-08-15 LAB — GLUCOSE, CAPILLARY: Glucose-Capillary: 156 mg/dL — ABNORMAL HIGH (ref 70–99)

## 2012-08-15 LAB — BASIC METABOLIC PANEL
BUN: 37 mg/dL — ABNORMAL HIGH (ref 6–23)
CO2: 34 mEq/L — ABNORMAL HIGH (ref 19–32)
Calcium: 9.1 mg/dL (ref 8.4–10.5)
Chloride: 95 mEq/L — ABNORMAL LOW (ref 96–112)
Creatinine, Ser: 1.16 mg/dL (ref 0.50–1.35)
Glucose, Bld: 161 mg/dL — ABNORMAL HIGH (ref 70–99)

## 2012-08-15 MED ORDER — METHYLPREDNISOLONE SODIUM SUCC 125 MG IJ SOLR
60.0000 mg | Freq: Three times a day (TID) | INTRAMUSCULAR | Status: DC
  Administered 2012-08-15: 60 mg via INTRAVENOUS
  Administered 2012-08-15: 10:00:00 via INTRAVENOUS
  Administered 2012-08-16: 60 mg via INTRAVENOUS
  Filled 2012-08-15 (×3): qty 2

## 2012-08-15 MED ORDER — INSULIN ASPART 100 UNIT/ML ~~LOC~~ SOLN: 0.0000 [IU] | Freq: Every day | SUBCUTANEOUS | Status: DC

## 2012-08-15 MED ORDER — INSULIN GLARGINE 100 UNIT/ML ~~LOC~~ SOLN
10.0000 [IU] | Freq: Every day | SUBCUTANEOUS | Status: DC
  Administered 2012-08-15 – 2012-08-16 (×2): 10 [IU] via SUBCUTANEOUS

## 2012-08-15 MED ORDER — INSULIN ASPART 100 UNIT/ML ~~LOC~~ SOLN
0.0000 [IU] | Freq: Three times a day (TID) | SUBCUTANEOUS | Status: DC
  Administered 2012-08-15: 3 [IU] via SUBCUTANEOUS
  Administered 2012-08-15: 5 [IU] via SUBCUTANEOUS
  Administered 2012-08-16 – 2012-08-17 (×4): 2 [IU] via SUBCUTANEOUS
  Administered 2012-08-19: 3 [IU] via SUBCUTANEOUS

## 2012-08-15 MED ORDER — LISINOPRIL 5 MG PO TABS
2.5000 mg | ORAL_TABLET | Freq: Every day | ORAL | Status: DC
  Administered 2012-08-15 – 2012-08-17 (×3): 2.5 mg via ORAL
  Administered 2012-08-19: 11:00:00 via ORAL
  Filled 2012-08-15 (×7): qty 1

## 2012-08-15 NOTE — Progress Notes (Signed)
PT WILL BE TRANSFERING TO ROOM 303. PT IS ALERT AND ORIENTED. O2 SAT95% ON O2 AT 4L/MIN VIA [place].BREATH SOUNDS ARE DIMINISHED.NON-PRODUCTIVE COUGH. FOLEY CATH PATENT DRAINING CLEAR AMBER URINE. RT WRIST IV PATENT. TED HOSE AND SCD'S IN PLACE AS ORDERED. PT WAS ABLE TO FEED HIMSELF LUNCH TODAY. HR 80'S AND 90'S IN A-FIB. TRANSFER REPORT CALLED TO KAREN RN ON 300. PT WILL BE TRANSFERRED TO FLOOR VIA HOSPITAL BED

## 2012-08-15 NOTE — Progress Notes (Addendum)
[facility] PROGRESS NOTE  ESCO JOSLYN WUJ:[id number] DOB: 1921/11/08 DOA: 08/12/2012 PCP: [name], MD  Assessment/Plan:  . Acute respiratory failure with hypercapnia : Secondary to COPD exacerbation and acute on chronic diastolic failure and newly diagnosed acute systolic heart failure. He is clinically improved. Will continue steroids, antibiotics, and oxygen, with some adjustments as needed. He is on Solu-Medrol, Levaquin, and Maxipime day #3. Status post BiPAP.   Acute on chronic respiratory failure with chronic hypercapnia and hypoxia: He is on home oxygen chronically.   . Acute on chronic diastolic congestive heart failure/newly diagnosed acute systolic heart failure/pulmonary hypertension/tricuspid valve regurgitation. His 2-D echocardiogram this admission revealed a decrease in systolic function with an ejection fraction of 35-40%. In 07/2011 his EF was 50-55%. He has long-standing CAD, and doubt that he is a surgical candidate given his age and debilitation. We'll start ACE inhibitor therapy with lisinopril. Will not start beta blocker due to to history of bradycardia on it and severe COPD. Current volume status -2.1L.  Marland Kitchen COPD exacerbation : Clinically improving. Continue oxygen, steroids, antibiotics, and nebulizations.  Drug-induced hyperglycemia, secondary to steroids. Continue sliding scale NovoLog and Lantus. Adjust accordingly.    Afib: chronic. His rate is controlled. Continue diltiazem. Apparently aspirin and Plavix were discontinued secondary to recurrent GI bleeding.   Possible obstructive sleep apnea : See #1. Daughter indicates she and PCP in process of planning OP sleep study.    . Anemia : macrocytic. He has long-standing anemia from history of GI bleeding. His B12 and TSH were within normal limits. His hemoglobin is more less stable. Continue PPI.  Mild azotemia. His BUN is increasing, which may be secondary to diuresis and IV steroids. Will decrease  steroids and decrease Lasix and change Lasix to by mouth.   Clinically improved, will transfer to telemetry. Will reorder PT evaluation.  Code Status: full Family Communication: daughter at bedside Disposition Plan: may need SNF but will likely wish to go home. Not medically ready yet.    Consultants:  none  Procedures:    2-D echocardiogram 08/13/2012:  - Left ventricle: Diffuse hypokinesis worse in the inferior wall The cavity size was normal. There was moderate concentric hypertrophy. Systolic function was moderately reduced. The estimated ejection fraction was in the range of 35% to 40%. - Aortic valve: Moderately calcified with restricted motion. Suspect moderate AS given degree of LV dysfunction CW not adequate Have spoken to tech to repeat - Mitral valve: Tissue MVR not well seen Diastolic gradient are a bit high No MR Valve area by pressure half-time: 1.71cm^2. - Left atrium: The atrium was moderately dilated. - Right ventricle: The cavity size was severely dilated. - Right atrium: The atrium was mildly dilated. - Atrial septum: No defect or patent foramen ovale was identified. - Tricuspid valve: Moderate regurgitation. - Pulmonary arteries: PA peak pressure: 53mm Hg (S). Transthoracic echocardiography. M-mode, complete 2D, spectral Doppler, and color Doppler. Height: Height: 190.5cm. Height: 75in. Weight: Weight: 103kg. Weight: 226.5lb. Body mass index: BMI: 28.4kg/m^2. Body surface area: BSA: 2.82m^2. Patient status: Inpatient. Location: ICU/CCU        Antibiotics:  levaquin 08/13/12>>>  Cefepime 08/13/2012>>  HPI/Subjective:  The patient is sitting up in bed, shaving. He denies chest pain, shortness of breath, or abdominal pain.   Objective: Filed Vitals:   08/15/12 0517 08/15/12 0600 08/15/12 0700 08/15/12 0726  BP: 110/64 108/66 109/63   Pulse:  82 64   Temp:      TempSrc:  Resp:  24 24   Height:      Weight:      SpO2:  91% 92% 95%     Intake/Output Summary (Last 24 hours) at 08/15/12 0851 Last data filed at 08/15/12 0500  Gross per 24 hour  Intake   1440 ml  Output   1250 ml  Net    190 ml   Filed Weights   08/13/12 0021 08/14/12 0525 08/15/12 0500  Weight: 103.4 kg (227 lb 15.3 oz) 104.9 kg (231 lb 4.2 oz) 110 kg (242 lb 8.1 oz)    Exam:   General:  Pleasant alert elderly 79 year old man sitting up in bed, in no acute distress.   Cardiovascular: irregularly irregular  Respiratory: Breathing nonlabored. A few rhonchus wheezing in the lower lobes.   Abdomen: Positive bowel sounds, soft, nontender, nondistended.  Extremities: Trace of pedal edema bilaterally.  Neurologic: He is alert and oriented to himself, his daughter, and hospital. Cranial nerves grossly intact.   Data Reviewed: Basic Metabolic Panel:  Recent Labs Lab 08/12/12 1755 08/13/12 0547 08/14/12 0446 08/15/12 0501  NA 135  --  140 137  K 4.1  --  4.7 4.5  CL 92*  --  96 95*  CO2 34*  --  35* 34*  GLUCOSE 145*  --  156* 161*  BUN 26*  --  28* 37*  CREATININE 1.11  --  0.97 1.16  CALCIUM 9.0  --  9.0 9.1  MG  --  2.1  --   --    Liver Function Tests: No results found for this basename: AST, ALT, ALKPHOS, BILITOT, PROT, ALBUMIN,  in the last 168 hours No results found for this basename: LIPASE, AMYLASE,  in the last 168 hours No results found for this basename: AMMONIA,  in the last 168 hours CBC:  Recent Labs Lab 08/12/12 1755 08/14/12 0446 08/15/12 0501  WBC 10.2 5.7 9.5  NEUTROABS 8.1*  --   --   HGB 11.8* 11.5* 11.0*  HCT 37.5* 36.8* 34.2*  MCV 103.0* 101.7* 100.9*  PLT 167 175 201   Cardiac Enzymes:  Recent Labs Lab 08/12/12 1755  TROPONINI <0.30   BNP (last 3 results)  Recent Labs  07/31/12 1357 08/03/12 0536 08/12/12 1755  PROBNP 3377.0* 2605.0* 5479.0*   CBG:  Recent Labs Lab 08/14/12 0821 08/14/12 1142 08/14/12 1645 08/14/12 2101 08/15/12 0748  GLUCAP 144* 207* 162* 122* 139*    No  results found for this or any previous visit (from the past 240 hour(s)).   Studies: Dg Chest Port 1 View  08/14/2012  *RADIOLOGY REPORT*  Clinical Data: Respiratory failure and COPD.  Diastolic dysfunction  PORTABLE CHEST - 1 VIEW  Comparison: 08/12/2012  Findings: Cardiac enlargement.  Pulmonary vascular congestion and mild edema has improved since the prior study.  There is mild atelectasis in the lung bases.  No significant effusion however of the right lung base has been clipped on the image.  IMPRESSION: Mild improvement in pulmonary vascular congestion and fluid overload.   Original Report Authenticated By: Janeece Riggers, M.D.     Scheduled Meds: . albuterol  2.5 mg Nebulization Q4H WA  . atorvastatin  10 mg Oral q1800  . ceFEPime (MAXIPIME) IV  1 g Intravenous Q12H  . diltiazem  120 mg Oral Q6H  . guaiFENesin  1,200 mg Oral BID  . insulin aspart  0-20 Units Subcutaneous TID WC  . insulin aspart  0-5 Units Subcutaneous QHS  . insulin glargine  10 Units Subcutaneous BID  . ipratropium  0.5 mg Nebulization Q4H WA  . levofloxacin (LEVAQUIN) IV  750 mg Intravenous Q24H  . methylPREDNISolone (SOLU-MEDROL) injection  80 mg Intravenous Q6H  . mometasone-formoterol  2 puff Inhalation BID  . nystatin  500,000 Units Oral TID AC & HS  . pantoprazole  80 mg Oral Daily  . Tamsulosin HCl  0.4 mg Oral q morning - 10a   Continuous Infusions:   Principal Problem:   Acute-on-chronic respiratory failure Active Problems:   COPD exacerbation   Anemia, iron deficiency   Atrial fibrillation, chronic   Acute on chronic diastolic congestive heart failure   Macrocytosis without anemia   Hyperglycemia, drug-induced   Acute respiratory failure with hypercapnia   Acute systolic congestive heart failure   Aortic stenosis, moderate   Pulmonary hypertension due to COPD   Tricuspid valve regurgitation      Christinia Lambeth  [facility]  If 8PM-8AM, please contact night-coverage at  [email or web address], password [facility] 08/15/2012, 8:51 AM  LOS: 3 days

## 2012-08-16 LAB — BASIC METABOLIC PANEL
GFR calc Af Amer: 58 mL/min — ABNORMAL LOW (ref >90)
GFR calc non Af Amer: 50 mL/min — ABNORMAL LOW (ref >90)
Potassium: 4 mEq/L (ref 3.5–5.1)
Sodium: 139 mEq/L (ref 135–145)

## 2012-08-16 LAB — GLUCOSE, CAPILLARY
Glucose-Capillary: 142 mg/dL — ABNORMAL HIGH (ref 70–99)
Glucose-Capillary: 146 mg/dL — ABNORMAL HIGH (ref 70–99)
Glucose-Capillary: 132 mg/dL — ABNORMAL HIGH (ref 70–99)

## 2012-08-16 MED ORDER — SODIUM CHLORIDE 0.45 % IV SOLN
INTRAVENOUS | Status: DC
  Administered 2012-08-16 – 2012-08-19 (×3): via INTRAVENOUS

## 2012-08-16 MED ORDER — FUROSEMIDE 40 MG PO TABS
40.0000 mg | ORAL_TABLET | Freq: Two times a day (BID) | ORAL | Status: DC
  Administered 2012-08-16: 40 mg via ORAL
  Filled 2012-08-16: qty 1

## 2012-08-16 MED ORDER — LEVOFLOXACIN 750 MG PO TABS
750.0000 mg | ORAL_TABLET | Freq: Every day | ORAL | Status: DC
  Administered 2012-08-16 – 2012-08-17 (×2): 750 mg via ORAL
  Filled 2012-08-16 (×2): qty 1

## 2012-08-16 MED ORDER — FUROSEMIDE 20 MG PO TABS
20.0000 mg | ORAL_TABLET | Freq: Two times a day (BID) | ORAL | Status: DC
  Administered 2012-08-17 – 2012-08-19 (×3): 20 mg via ORAL
  Filled 2012-08-16 (×4): qty 1

## 2012-08-16 MED ORDER — METHYLPREDNISOLONE SODIUM SUCC 125 MG IJ SOLR
60.0000 mg | Freq: Two times a day (BID) | INTRAMUSCULAR | Status: DC
  Administered 2012-08-16: 11:00:00 via INTRAVENOUS
  Administered 2012-08-16 – 2012-08-17 (×3): 60 mg via INTRAVENOUS
  Filled 2012-08-16 (×4): qty 2

## 2012-08-16 NOTE — Progress Notes (Signed)
PHARMACIST - PHYSICIAN COMMUNICATION DR:    CONCERNING: Antibiotic IV to Oral Route Change Policy  RECOMMENDATION: This patient is receiving Levaquin by the intravenous route.  Based on criteria approved by the Pharmacy and Therapeutics Committee, the antibiotic(s) is/are being converted to the equivalent oral dose form(s).   DESCRIPTION: These criteria include:  Patient being treated for a respiratory tract infection, urinary tract infection, or cellulitis  The patient is not neutropenic and does not exhibit a GI malabsorption state  The patient is eating (either orally or via tube) and/or has been taking other orally administered medications for a least 24 hours  The patient is improving clinically and has a Tmax < 100.5  If you have questions about this conversion, please contact the Pharmacy Department  [x]   [phone] )  [facility] []   [phone] )  [facility]  []   [phone] )  [facility] []   [phone] )  [facility]

## 2012-08-16 NOTE — Progress Notes (Signed)
[facility] PROGRESS NOTE  [name] ZOX:[id number] DOB: 06-28-1921 DOA: 08/12/2012 PCP: [name], MD  Assessment/Plan:  . Acute respiratory failure with hypercapnia : Secondary to COPD exacerbation and acute on chronic diastolic failure and newly diagnosed acute systolic heart failure. He is clinically improved. Will continue steroids, antibiotics, and oxygen, with some adjustments as needed. He is on Solu-Medrol, Levaquin, and Maxipime day #4. Status post BiPAP.   Acute on chronic respiratory failure with chronic hypercapnia and hypoxia: He is on home oxygen chronically.   . Acute on chronic diastolic congestive heart failure/newly diagnosed acute systolic heart failure/pulmonary hypertension/tricuspid valve regurgitation. His 2-D echocardiogram this admission revealed a decrease in systolic function with an ejection fraction of 35-40%. In 07/2011 his EF was 50-55%. He has long-standing CAD, and doubt that he is a surgical candidate given his age and debilitation. Lisinopril was started 08/15/2012. Will not start beta blocker due to to history of bradycardia on it and severe COPD. Will adjust his Lasix as he appears to be becoming a little volume depleted; will change from 40 mg twice a day to 20 mg twice a day. Status post IV Lasix for several days. We'll add gentle IV fluid at only KVO.  Marland Kitchen COPD exacerbation : Clinically improving. Continue oxygen, steroids, antibiotics, and nebulizations. Solu-Medrol will be titrated down to 60 mg every 12 hours. Adjust accordingly.  Drug-induced hyperglycemia, secondary to steroids. Continue sliding scale NovoLog and Lantus. Adjust accordingly.    Afib: chronic. His rate is controlled. Continue diltiazem. Apparently aspirin and Plavix were discontinued secondary to recurrent GI bleeding per history given by patient and daughter.   Possible obstructive sleep apnea : See #1. Daughter indicates she and PCP in process of planning OP sleep  study.    . Anemia : macrocytic. He has long-standing anemia from history of GI bleeding. His B12 and TSH were within normal limits. His hemoglobin is more less stable. Continue PPI.  Mild azotemia. His BUN is increasing, which may be secondary to diuresis and IV steroids. Will decrease steroids and decrease Lasix and change Lasix to by mouth. We'll restart very gentle IV fluids x24 hours.   Will reorder PT evaluation. We'll decrease Foley catheter in the morning.  Code Status: full Family Communication: daughter at bedside Disposition Plan: may need SNF but will likely wish to go home. Not medically ready yet.    Consultants:  none  Procedures:    2-D echocardiogram 08/13/2012:  - Left ventricle: Diffuse hypokinesis worse in the inferior wall The cavity size was normal. There was moderate concentric hypertrophy. Systolic function was moderately reduced. The estimated ejection fraction was in the range of 35% to 40%. - Aortic valve: Moderately calcified with restricted motion. Suspect moderate AS given degree of LV dysfunction CW not adequate Have spoken to tech to repeat - Mitral valve: Tissue MVR not well seen Diastolic gradient are a bit high No MR Valve area by pressure half-time: 1.71cm^2. - Left atrium: The atrium was moderately dilated. - Right ventricle: The cavity size was severely dilated. - Right atrium: The atrium was mildly dilated. - Atrial septum: No defect or patent foramen ovale was identified. - Tricuspid valve: Moderate regurgitation. - Pulmonary arteries: PA peak pressure: 53mm Hg (S). Transthoracic echocardiography. M-mode, complete 2D, spectral Doppler, and color Doppler. Height: Height: 190.5cm. Height: 75in. Weight: Weight: 103kg. Weight: 226.5lb. Body mass index: BMI: 28.4kg/m^2. Body surface area: BSA: 2.52m^2. Patient status: Inpatient. Location: ICU/CCU        Antibiotics:  levaquin 08/13/12>>>  Cefepime  08/13/2012>>  HPI/Subjective:  Pleasant alert elderly man sitting up in bed, in no acute distress. He has no complaints of chest pain, chest congestion, or shortness of breath.  Objective: Filed Vitals:   08/16/12 0323 08/16/12 0500 08/16/12 0540 08/16/12 0752  BP:   138/82   Pulse:   94   Temp:   97.7 F (36.5 C)   TempSrc:   Oral   Resp:   19   Height:      Weight:  106.7 kg (235 lb 3.7 oz) 106.8 kg (235 lb 7.2 oz)   SpO2: 95%  93% 92%    Intake/Output Summary (Last 24 hours) at 08/16/12 1020 Last data filed at 08/16/12 0818  Gross per 24 hour  Intake    440 ml  Output   1101 ml  Net   -661 ml   Filed Weights   08/15/12 0500 08/16/12 0500 08/16/12 0540  Weight: 110 kg (242 lb 8.1 oz) 106.7 kg (235 lb 3.7 oz) 106.8 kg (235 lb 7.2 oz)    Exam:   General:  Pleasant alert elderly 79 year old man sitting up in bed, in no acute distress.   Cardiovascular: irregularly irregular  Respiratory: Breathing nonlabored. Significant decrease in rhonchus wheezing. Occasional crackles persist.  Abdomen: Positive bowel sounds, soft, nontender, nondistended.  Extremities: Trace of pedal edema bilaterally.  Neurologic: He is alert and oriented x3. His speech is clear.   Data Reviewed: Basic Metabolic Panel:  Recent Labs Lab 08/12/12 1755 08/13/12 0547 08/14/12 0446 08/15/12 0501 08/16/12 0549  NA 135  --  140 137 139  K 4.1  --  4.7 4.5 4.0  CL 92*  --  96 95* 97  CO2 34*  --  35* 34* 32  GLUCOSE 145*  --  156* 161* 152*  BUN 26*  --  28* 37* 40*  CREATININE 1.11  --  0.97 1.16 1.24  CALCIUM 9.0  --  9.0 9.1 9.2  MG  --  2.1  --   --   --    Liver Function Tests: No results found for this basename: AST, ALT, ALKPHOS, BILITOT, PROT, ALBUMIN,  in the last 168 hours No results found for this basename: LIPASE, AMYLASE,  in the last 168 hours No results found for this basename: AMMONIA,  in the last 168 hours CBC:  Recent Labs Lab 08/12/12 1755 08/14/12 0446  08/15/12 0501  WBC 10.2 5.7 9.5  NEUTROABS 8.1*  --   --   HGB 11.8* 11.5* 11.0*  HCT 37.5* 36.8* 34.2*  MCV 103.0* 101.7* 100.9*  PLT 167 175 201   Cardiac Enzymes:  Recent Labs Lab 08/12/12 1755  TROPONINI <0.30   BNP (last 3 results)  Recent Labs  07/31/12 1357 08/03/12 0536 08/12/12 1755  PROBNP 3377.0* 2605.0* 5479.0*   CBG:  Recent Labs Lab 08/15/12 0748 08/15/12 1130 08/15/12 1728 08/15/12 2049 08/16/12 0748  GLUCAP 139* 146* 204* 156* 142*    No results found for this or any previous visit (from the past 240 hour(s)).   Studies: No results found.  Scheduled Meds: . albuterol  2.5 mg Nebulization Q4H WA  . atorvastatin  10 mg Oral q1800  . ceFEPime (MAXIPIME) IV  1 g Intravenous Q12H  . diltiazem  120 mg Oral Q6H  . furosemide  40 mg Oral BID  . guaiFENesin  1,200 mg Oral BID  . insulin aspart  0-15 Units Subcutaneous TID WC  . insulin aspart  0-5 Units Subcutaneous QHS  . insulin glargine  10 Units Subcutaneous QHS  . ipratropium  0.5 mg Nebulization Q4H WA  . levofloxacin (LEVAQUIN) IV  750 mg Intravenous Q24H  . lisinopril  2.5 mg Oral Daily  . methylPREDNISolone (SOLU-MEDROL) injection  60 mg Intravenous Q8H  . mometasone-formoterol  2 puff Inhalation BID  . nystatin  500,000 Units Oral TID AC & HS  . pantoprazole  80 mg Oral Daily  . Tamsulosin HCl  0.4 mg Oral q morning - 10a   Continuous Infusions:   Principal Problem:   Acute-on-chronic respiratory failure Active Problems:   COPD exacerbation   Anemia, iron deficiency   Atrial fibrillation, chronic   Acute on chronic diastolic congestive heart failure   Macrocytosis without anemia   Hyperglycemia, drug-induced   Acute respiratory failure with hypercapnia   Acute systolic congestive heart failure   Aortic stenosis, moderate   Pulmonary hypertension due to COPD   Tricuspid valve regurgitation      Khaliq Turay  [facility]  If 8PM-8AM, please contact  night-coverage at [email or web address], password [facility] 08/16/2012, 10:20 AM  LOS: 4 days

## 2012-08-17 ENCOUNTER — Encounter (HOSPITAL_COMMUNITY): Payer: Self-pay | Admitting: Internal Medicine

## 2012-08-17 DIAGNOSIS — G9340 Encephalopathy, unspecified: Secondary | ICD-10-CM | POA: Diagnosis present

## 2012-08-17 DIAGNOSIS — I5023 Acute on chronic systolic (congestive) heart failure: Secondary | ICD-10-CM

## 2012-08-17 DIAGNOSIS — I359 Nonrheumatic aortic valve disorder, unspecified: Secondary | ICD-10-CM

## 2012-08-17 DIAGNOSIS — R0902 Hypoxemia: Secondary | ICD-10-CM

## 2012-08-17 DIAGNOSIS — I2510 Atherosclerotic heart disease of native coronary artery without angina pectoris: Secondary | ICD-10-CM

## 2012-08-17 LAB — BLOOD GAS, ARTERIAL
Acid-Base Excess: 8.2 mmol/L — ABNORMAL HIGH (ref 0.0–2.0)
Bicarbonate: 32.4 mEq/L — ABNORMAL HIGH (ref 20.0–24.0)
O2 Saturation: 79.4 %
TCO2: 28.7 mmol/L (ref 0–100)
pO2, Arterial: 45.2 mmHg — ABNORMAL LOW (ref 80.0–100.0)

## 2012-08-17 LAB — GLUCOSE, CAPILLARY
Glucose-Capillary: 117 mg/dL — ABNORMAL HIGH (ref 70–99)
Glucose-Capillary: 128 mg/dL — ABNORMAL HIGH (ref 70–99)

## 2012-08-17 LAB — CBC
MCH: 32.1 pg (ref 26.0–34.0)
MCHC: 31.6 g/dL (ref 30.0–36.0)
Platelets: 252 10*3/uL (ref 150–400)

## 2012-08-17 LAB — URINALYSIS, ROUTINE W REFLEX MICROSCOPIC
Leukocytes, UA: NEGATIVE
Nitrite: NEGATIVE
Specific Gravity, Urine: 1.02 (ref 1.005–1.030)
Urobilinogen, UA: 0.2 mg/dL (ref 0.0–1.0)
pH: 5.5 (ref 5.0–8.0)

## 2012-08-17 LAB — BASIC METABOLIC PANEL
BUN: 36 mg/dL — ABNORMAL HIGH (ref 6–23)
Calcium: 9.1 mg/dL (ref 8.4–10.5)
GFR calc non Af Amer: 57 mL/min — ABNORMAL LOW (ref >90)
Glucose, Bld: 107 mg/dL — ABNORMAL HIGH (ref 70–99)

## 2012-08-17 MED ORDER — CARVEDILOL 3.125 MG PO TABS
6.2500 mg | ORAL_TABLET | Freq: Two times a day (BID) | ORAL | Status: DC
  Administered 2012-08-17 – 2012-08-20 (×4): 6.25 mg via ORAL
  Filled 2012-08-17 (×5): qty 2

## 2012-08-17 MED ORDER — POTASSIUM CHLORIDE CRYS ER 20 MEQ PO TBCR
20.0000 meq | EXTENDED_RELEASE_TABLET | Freq: Two times a day (BID) | ORAL | Status: DC
  Administered 2012-08-17 – 2012-08-20 (×6): 20 meq via ORAL
  Filled 2012-08-17 (×7): qty 1

## 2012-08-17 MED ORDER — LEVOFLOXACIN 500 MG PO TABS
500.0000 mg | ORAL_TABLET | Freq: Every day | ORAL | Status: DC
  Filled 2012-08-17: qty 1

## 2012-08-17 MED ORDER — DIGOXIN 125 MCG PO TABS
0.1250 mg | ORAL_TABLET | Freq: Every day | ORAL | Status: DC
  Administered 2012-08-17 – 2012-08-20 (×3): 0.125 mg via ORAL
  Filled 2012-08-17 (×4): qty 1

## 2012-08-17 MED ORDER — METHYLPREDNISOLONE SODIUM SUCC 125 MG IJ SOLR
60.0000 mg | Freq: Every day | INTRAMUSCULAR | Status: DC
  Filled 2012-08-17: qty 2

## 2012-08-17 NOTE — Progress Notes (Signed)
Patient confused 02 sat of 91% on 4l .

## 2012-08-17 NOTE — Progress Notes (Addendum)
Patient wore bipap for approximately four hours last night.  Bipap taken off due to patient's agitation.

## 2012-08-17 NOTE — Clinical Social Work Placement (Signed)
Clinical Social Work Department CLINICAL SOCIAL WORK PLACEMENT NOTE 08/17/2012  Patient:  [name], [name]  Account Number:  [id number] Admit date:  08/12/2012  Clinical Social Worker:  Santa Genera, CLINICAL SOCIAL WORKER  Date/time:  08/14/2012 04:00 PM  Clinical Social Work is seeking post-discharge placement for this patient at the following level of care:   SKILLED NURSING   (*CSW will update this form in Epic as items are completed)   08/14/2012  Patient/family provided with [facility] Department of Clinical Social Work's list of facilities offering this level of care within the geographic area requested by the patient (or if unable, by the patient's family).  08/14/2012  Patient/family informed of their freedom to choose among providers that offer the needed level of care, that participate in Medicare, Medicaid or managed care program needed by the patient, have an available bed and are willing to accept the patient.  08/14/2012  Patient/family informed of [facility]' ownership interest in [facility], as well as of the fact that they are under no obligation to receive care at this facility.  PASARR submitted to EDS on 08/14/2012 PASARR number received from EDS on 08/14/2012  FL2 transmitted to all facilities in geographic area requested by pt/family on  08/14/2012 FL2 transmitted to all facilities within larger geographic area on   Patient informed that his/her managed care company has contracts with or will negotiate with  certain facilities, including the following:     Patient/family informed of bed offers received:  08/17/2012 Patient chooses bed at [facility] Physician recommends and patient chooses bed at  [facility]  Patient to be transferred to  on   Patient to be transferred to facility by   The following physician request were entered in Epic:   Additional Comments:  [name], LCSW [phone]

## 2012-08-17 NOTE — Clinical Social Work Note (Signed)
CSW presented bed offers to pt's daughter as pt sleeping. She accepts bed at [facility]. Facility notified. Awaiting stability for d/c.   [name], LCSW [phone]

## 2012-08-17 NOTE — Consult Note (Signed)
Primary Cardiologist: Dr. Fallon Bing  SUBJECTIVE:Confused but follows commands.   LABS: Basic Metabolic Panel:  Recent Labs  11/91/47 0549 08/17/12 0441  NA 139 142  K 4.0 3.6  CL 97 99  CO2 32 33*  GLUCOSE 152* 107*  BUN 40* 36*  CREATININE 1.24 1.11  CALCIUM 9.2 9.1   CBC:  Recent Labs  08/15/12 0501 08/17/12 0441  WBC 9.5 11.9*  HGB 11.0* 12.1*  HCT 34.2* 38.3*  MCV 100.9* 101.6*  PLT 201 252   Echocardigram 08/13/2012 Left ventricle: Diffuse hypokinesis worse in the inferior wall The cavity size was normal. There was moderate concentric hypertrophy. Systolic function was moderately reduced. The estimated ejection fraction was in the range of 35% to 40%. - Aortic valve: Moderately calcified with restricted motion. Suspect moderate AS given degree of LV dysfunction CW not adequate Have spoken to tech to repeat - Mitral valve: Tissue MVR not well seen Diastolic gradient are a bit high No MR Valve area by pressure half-time: 1.71cm^2. - Left atrium: The atrium was moderately dilated. - Right ventricle: The cavity size was severely dilated. - Right atrium: The atrium was mildly dilated. - Atrial septum: No defect or patent foramen ovale was identified. - Tricuspid valve: Moderate regurgitation. - Pulmonary arteries: PA peak pressure: 53mm Hg (S).  Echocardiogram 2.12.2013 Left ventricle: The cavity size was at the upper limits of normal. There was moderate concentric hypertrophy. Systolic function was normal. The estimated ejection fraction was in the range of 55% to 60%. Mild to moderate hypokinesis of the anteroseptal myocardium. Inferoposterior region is hyperdynamic. - Aortic valve: Moderately calcified annulus. Trileaflet; mildly thickened, mildly calcified leaflets. Cusp separation was mildly reduced. There was mild stenosis. Trivial regurgitation. Valve area: 1.57cm^2(VTI). Valve area: 1.89cm^2 (Vmax). - Aorta: The aorta was moderately  calcified. - Mitral valve: A bioprosthesis was present and functioning normally. Theleaflets had a normal range of motion. The sewing ring appeared normal. Valve area by pressure half-time: 1.67cm^2. Valve area by continuity equation (using LVOT flow): 1.38cm^2. - Left atrium: The atrium was moderately dilated. - Right ventricle: The cavity size was at the upper limits of normal. Wall thickness was mildly increased. - Right atrium: The atrium was mildly to moderately dilated. - Atrial septum: No defect or patent foramen ovale was identified. - Pulmonary arteries: Systolic pressure was moderately increased. PA peak pressure: 71mm Hg (S). Impressions:  PHYSICAL EXAM BP 130/86  Pulse 87  Temp(Src) 97.4 F (36.3 C) (Oral)  Resp 20  Ht 6\' 3"  (1.905 m)  Wt 235 lb 7.2 oz (106.8 kg)  BMI 29.43 kg/m2  SpO2 94%Wt loss since highest recorde 13 lbs. General: No acute distress  Lungs: Clear bilaterally to auscultation and percussion. Heart: HRIR 2/6 systolic murmur.  Extremities: No clubbing, cyanosis or edema.  DP +1 Neuro: Alert and confused.  TELEMETRY: Reviewed telemetry pt in PAF, frequent PVC's.   ASSESSMENT AND PLAN:  1. Acute on chronic systolic CHF: Echo shows decrease in EF from 55% to 35% over the last year. He has diuresed approx 2000 cc on IV lasix, now transitioned to PO. Family does not want any aggressive cardiac testing at this point. Daughter at bedside is very anxious about the deterioration of his physical status. Currently on Cardizem for atrial fib, which is now not optimal in the setting of decreased systolic dysfunction.  Will change to Coreg, add po digoxin and follow his renal function. On lasix 20 mg BID. No overt evidence of fluid overload. Might  be able to add spironolactone once evaluation of his response to changes in medical management are assessed. I have conferred with CHF team at [facility] concerning medication changes.   2. Atrial fibrillation: Heart rate is in  the 80's with frequent ventricular contractions. Will not plan on anticoagulation with history of AAA a spontaneous large retroperitoneal hematoma on last admission 2 weeks ago. Adding dig for HR control with BB.  3. COPD: No evidence of exacerbation, wheezes with CXR negative for pneumonia or lung disease, only atelectasis. Continues on albuterol inhalation.   4. Leukocytosis: Likely related to steroid inhalation, but has had foley catheter in place for several days. Will check UA for UTI with continued confusion.  5. AMS: Having hallucinations, combativeness and some confusion as to place and time. Pulling off his oxygen. No evidence of metabolic encephalopathy on labs. Possibility of hypoxic encephalopathy can be considered. Will defer to [facility]TH for further work up.   6. AAA: CT revealed aneurysm to be about 5cm on 07/2012 and a retroperitoneal hematoma on the left. It was felt that this likely represented a spontaneous retroperitoneal hematoma and not a ruptured AAA.   7. CAD: RCA stent, CABG in 2003 with mitral valve repair. No further invasive testing per family.   8. DNR  Bettey Mare. [name] [name] 08/17/2012, 8:01 AM  Attending note:  Patient seen and examined. Modified above note by Ms. Lawrence NP. He is confused, follows commands. Reviewed the most recent echocardiogram report. Plan is for conservative medical management as noted above. Agree with change from calcium channel blocker to Coreg plus digoxin for management of cardiomyopathy and atrial fibrillation rate control. No anticoagulation with history of recent retroperitoneal hematoma. Need to keep an eye on urine output and also renal function.  Jonelle Sidle, M.D., F.A.C.C.

## 2012-08-17 NOTE — Progress Notes (Addendum)
[facility] PROGRESS NOTE  [name] QQV:[id number] DOB: [birth year] DOA: 08/12/2012 PCP: [name], MD  Assessment/Plan:  Acute encephalopathy: likely related to hypoxia, but consider underlying dementia, steroid associated Librium, and quinolone associated delirium. Will decrease Solu-Medrol and dose of Levaquin. Urinalysis unremarkable, afebrile, VSS. ABG yields O2 45. Oxygen delivery system was compromised briefly. Will monitor and recheck.  Acute on chronic respiratory failure with chronic hypercapnia and hypoxia: See #1. CO2 level on ABG 47.3. Continue oxygen and monitor sats.   . Acute respiratory failure with hypercapnia: His PCO2 is much improved and is likely at baseline. Was secondary to COPD exacerbation and acute on chronic diastolic failure and newly diagnosed acute systolic heart failure. Will continue to wean down Solu-Medrol. He is on Solu-Medrol, Levaquin, and Maxipime day #5. Status post BiPAP last evening.  . Acute on chronic diastolic congestive heart failure/newly diagnosed acute systolic heart failure/pulmonary hypertension/tricuspid valve regurgitation. His 2-D echocardiogram this admission revealed a decrease in systolic function with an ejection fraction of 35-40%. ACE inhibitor therapy with lisinopril was started on 08/15/2012. Cardiology consulted. Changes in medications noted. Appreciate cards assistance. Cardizem stopped and coreg started as well as digoxin. Continue lasix 20mg  po BID. Volume status -2.2L.    Marland Kitchen COPD exacerbation : Much improved . Continue oxygen, steroids, antibiotics, and nebulizations. Solu-Medrol was titrated down to 60 mg every 12 hours. Will likely taper again tomorrow .   Drug-induced hyperglycemia, secondary to steroids. Continue sliding scale NovoLog and Lantus. CBG range 129-146.   Afib: chronic. His rate is controlled. See #1. Aspirin and Plavix were discontinued secondary to recurrent GI bleeding per history given by  patient and daughter. Also, the patient was noted to have a large retroperitoneal bleed in January 2014.  Possible obstructive sleep apnea : See #1. Daughter indicates she and PCP in process of planning OP sleep study.   . Anemia : macrocytic. He has long-standing anemia from history of GI bleeding and recent retroperitoneal bleed.Marland Kitchen His B12 and TSH were within normal limits. His hemoglobin is more or less stable. Continue PPI.   Mild azotemia. His BUN trending downward today. Steroids tapered 08/16/12 and  Lasix decreased and change Lasix to by mouth on 08/17/12. Also s/p 24 hours gently IV hydration.      Code Status: full Family Communication:  Disposition Plan: likely SNf in day or two   Consultants:  Cardiology    Procedures:  none  Antibiotics:  levaquin 08/13/12 >>>  cefipime 08/13/12 >>>  HPI/Subjective: Awake, visual hallucinations, cooperative denies pain  Objective: Filed Vitals:   08/17/12 0224 08/17/12 0425 08/17/12 0730 08/17/12 1124  BP: 111/53 130/86    Pulse: 95 87    Temp: 97.7 F (36.5 C) 97.4 F (36.3 C)    TempSrc: Axillary Oral    Resp: 20 20    Height:      Weight:      SpO2:  94% 94% 96%    Intake/Output Summary (Last 24 hours) at 08/17/12 1356 Last data filed at 08/17/12 0800  Gross per 24 hour  Intake   2165 ml  Output   2050 ml  Net    115 ml   Filed Weights   08/15/12 0500 08/16/12 0500 08/16/12 0540  Weight: 110 kg (242 lb 8.1 oz) 106.7 kg (235 lb 3.7 oz) 106.8 kg (235 lb 7.2 oz)    Exam:   General:  Awake, somewhat pale. NAD  Cardiovascular: S1, S2, with ectopy and systolic murmur.  Respiratory: Clear  anteriorly with decreased breath sounds in the bases. Breathing is nonlabored.  Abdomen: soft +BS non-tender to palpation.   Extremities: No pedal edema.  Neurologic: He is hard of hearing, but he is alert and oriented to himself and hospital. His speech is clear.  Data Reviewed: Basic Metabolic Panel:  Recent  Labs Lab 08/12/12 1755 08/13/12 0547 08/14/12 0446 08/15/12 0501 08/16/12 0549 08/17/12 0441  NA 135  --  140 137 139 142  K 4.1  --  4.7 4.5 4.0 3.6  CL 92*  --  96 95* 97 99  CO2 34*  --  35* 34* 32 33*  GLUCOSE 145*  --  156* 161* 152* 107*  BUN 26*  --  28* 37* 40* 36*  CREATININE 1.11  --  0.97 1.16 1.24 1.11  CALCIUM 9.0  --  9.0 9.1 9.2 9.1  MG  --  2.1  --   --   --   --    Liver Function Tests: No results found for this basename: AST, ALT, ALKPHOS, BILITOT, PROT, ALBUMIN,  in the last 168 hours No results found for this basename: LIPASE, AMYLASE,  in the last 168 hours No results found for this basename: AMMONIA,  in the last 168 hours CBC:  Recent Labs Lab 08/12/12 1755 08/14/12 0446 08/15/12 0501 08/17/12 0441  WBC 10.2 5.7 9.5 11.9*  NEUTROABS 8.1*  --   --   --   HGB 11.8* 11.5* 11.0* 12.1*  HCT 37.5* 36.8* 34.2* 38.3*  MCV 103.0* 101.7* 100.9* 101.6*  PLT 167 175 201 252   Cardiac Enzymes:  Recent Labs Lab 08/12/12 1755  TROPONINI <0.30   BNP (last 3 results)  Recent Labs  08/03/12 0536 08/12/12 1755 08/17/12 0441  PROBNP 2605.0* 5479.0* 6458.0*   CBG:  Recent Labs Lab 08/16/12 1107 08/16/12 1647 08/16/12 2119 08/17/12 0708 08/17/12 1109  GLUCAP 146* 132* 147* 129* 117*    No results found for this or any previous visit (from the past 240 hour(s)).   Studies: No results found.  Scheduled Meds: . albuterol  2.5 mg Nebulization Q4H WA  . atorvastatin  10 mg Oral q1800  . carvedilol  6.25 mg Oral BID WC  . ceFEPime (MAXIPIME) IV  1 g Intravenous Q12H  . digoxin  0.125 mg Oral Daily  . furosemide  20 mg Oral BID  . guaiFENesin  1,200 mg Oral BID  . insulin aspart  0-15 Units Subcutaneous TID WC  . insulin aspart  0-5 Units Subcutaneous QHS  . insulin glargine  10 Units Subcutaneous QHS  . ipratropium  0.5 mg Nebulization Q4H WA  . levofloxacin  750 mg Oral Daily  . lisinopril  2.5 mg Oral Daily  . methylPREDNISolone  (SOLU-MEDROL) injection  60 mg Intravenous Q12H  . mometasone-formoterol  2 puff Inhalation BID  . nystatin  500,000 Units Oral TID AC & HS  . pantoprazole  80 mg Oral Daily  . potassium chloride  20 mEq Oral BID  . Tamsulosin HCl  0.4 mg Oral q morning - 10a   Continuous Infusions: . sodium chloride 40 mL/hr at 08/17/12 0707    Principal Problem:   Acute-on-chronic respiratory failure Active Problems:   COPD exacerbation   Anemia, iron deficiency   Atrial fibrillation, chronic   Acute on chronic diastolic congestive heart failure   Macrocytosis without anemia   Hyperglycemia, drug-induced   Acute respiratory failure with hypercapnia   Acute systolic congestive heart failure   Aortic stenosis,  moderate   Pulmonary hypertension due to COPD   Tricuspid valve regurgitation   Acute on chronic systolic heart failure   Acute encephalopathy    Time spent: 40 minutes    [facility]  [facility]  If 8PM-8AM, please contact night-coverage at [email or web address], password [facility] 08/17/2012, 1:56 PM  LOS: 5 days      Attending:  The patient was seen and examined. He was discussed with NP, Ms. Vedia Coffer. The above note has been annotated. The patient was apparently confused and hallucinating this morning. He had not been confused prior to this morning. His PaO2 on ABG was in the 40s, severely low and likely the cause of his encephalopathy. We'll also consider confusion from underlying dementia, quinolone, and steroid. The patient was not receiving oxygen as ordered this morning because there was some problem with the tubing of apparatus. It has been corrected and he is now oxygenating in the 90s on 3-4 L of oxygen. Will decrease Solu-Medrol to 60 mg daily and decrease the dose of Levaquin to 500 mg daily. Cefepime can be discontinued in the next day or 2. Changes in medications noted per cardiology. We'll continue to monitor him. Hopefully he can be discharged within the next 24-48 hours.  The plan is to eventually  discharge him to skilled nursing facility for rehabilitation  as discussed with his daughter.    Elliot Cousin, M.D.

## 2012-08-17 NOTE — Progress Notes (Signed)
Daughter refused pt to have bipap tonight she said last night he got very agaited with the machine was on sats are 95% on 4lpm no distress hr 93

## 2012-08-17 NOTE — Progress Notes (Signed)
Foley discontinued at 6am per md order.  Patient voided x 1 at [facility]

## 2012-08-18 ENCOUNTER — Encounter: Payer: Medicare Other | Admitting: Adult Health

## 2012-08-18 DIAGNOSIS — E669 Obesity, unspecified: Secondary | ICD-10-CM

## 2012-08-18 LAB — GLUCOSE, CAPILLARY
Glucose-Capillary: 113 mg/dL — ABNORMAL HIGH (ref 70–99)
Glucose-Capillary: 90 mg/dL (ref 70–99)

## 2012-08-18 LAB — CBC
HCT: 40.1 % (ref 39.0–52.0)
MCH: 31.5 pg (ref 26.0–34.0)
MCV: 102.8 fL — ABNORMAL HIGH (ref 78.0–100.0)
Platelets: 239 10*3/uL (ref 150–400)
RBC: 3.9 MIL/uL — ABNORMAL LOW (ref 4.22–5.81)

## 2012-08-18 LAB — BASIC METABOLIC PANEL
CO2: 34 mEq/L — ABNORMAL HIGH (ref 19–32)
Calcium: 8.8 mg/dL (ref 8.4–10.5)
Chloride: 102 mEq/L (ref 96–112)
Creatinine, Ser: 1.07 mg/dL (ref 0.50–1.35)
Glucose, Bld: 123 mg/dL — ABNORMAL HIGH (ref 70–99)
Sodium: 143 mEq/L (ref 135–145)

## 2012-08-18 MED ORDER — HALOPERIDOL LACTATE 5 MG/ML IJ SOLN
5.0000 mg | Freq: Four times a day (QID) | INTRAMUSCULAR | Status: DC | PRN
  Administered 2012-08-18: 5 mg via INTRAVENOUS
  Filled 2012-08-18: qty 1

## 2012-08-18 NOTE — Progress Notes (Signed)
Patient family refused the treatments (nebulizer) because of him being so agaited and confused and combative today.

## 2012-08-18 NOTE — Progress Notes (Signed)
UR Chart Review Completed  

## 2012-08-18 NOTE — Progress Notes (Signed)
[facility] PROGRESS NOTE  RANKIN COOLMAN AVW:[id number] DOB: October 11, 1921 DOA: 08/12/2012 PCP: [name], MD  Assessment/Plan: Acute encephalopathy: Etiology unclear. Remains combative, irritable. Sats >90%, awaiting urinalysis but no indication of infection. May have underlying dementia, steroid associated, and quinolone associated delirium. Solumedrol and levaquin dose decreased yesterday. Will decrease further.  Afebrile, VSS.    Acute on chronic respiratory failure with chronic hypercapnia and hypoxia: See #1. CO2 level on ABG 47.3 on 08/17/12. Seems to be baseline.  Continue oxygen and monitor sats.   . Acute respiratory failure with hypercapnia: His PCO2 is much improved and is likely at baseline. Was secondary to COPD exacerbation and acute on chronic diastolic failure and newly diagnosed acute systolic heart failure. Will continue to wean down Solu-Medrol. He is on Solu-Medrol, Levaquin, and Maxipime day #6. Will discontinue levaquin due to #1.    Marland Kitchen Acute on chronic diastolic congestive heart failure/newly diagnosed acute systolic heart failure/pulmonary hypertension/tricuspid valve regurgitation. His 2-D echocardiogram this admission revealed a decrease in systolic function with an ejection fraction of 35-40%. ACE inhibitor therapy with lisinopril was started on 08/15/2012. Cardiology consulted. Changes in medications noted. Appreciate cards assistance. Cardizem stopped and coreg started as well as digoxin. Continue lasix 20mg  po BID. Volume status - .   Marland Kitchen COPD exacerbation : Much improved . Continue oxygen, steroids, antibiotics, and nebulizations. Solu-Medrol was titrated down to 60 mg every 12 hours. Will likely taper again tomorrow .  Drug-induced hyperglycemia, secondary to steroids. Continue sliding scale NovoLog and Lantus. CBG range 113-128.  Afib: chronic. His rate is controlled. See #1. Aspirin and Plavix were discontinued secondary to recurrent GI bleeding per  history given by patient and daughter. Also, the patient was noted to have a large retroperitoneal bleed in January 2014.  Possible obstructive sleep apnea : See #1. Daughter indicates she and PCP in process of planning OP sleep study.  . Anemia : macrocytic. He has long-standing anemia from history of GI bleeding and recent retroperitoneal bleed.Marland Kitchen His B12 and TSH were within normal limits. His hemoglobin is more or less stable. Continue PPI.   Mild azotemia. His BUN trending downward today. Steroids tapered. Lasix decreased and change Lasix to by mouth on 08/17/12. Also s/p 24 hours gently IV hydration 2.24.14.       Code Status: full Family Communication: daughter at bedside Disposition Plan: SNF when medically stable   Consultants:  cardiology  Procedures:  none  Antibiotics: levaquin 08/13/12 >>> 08/18/12 cefipime 08/13/12 >>>  HPI/Subjective: Eyes closed, arouses to light touch. Combative, states "leave me alone". Denies pain.   Objective: Filed Vitals:   08/18/12 0220 08/18/12 0410 08/18/12 0625 08/18/12 0742  BP: 120/67  132/83   Pulse: 89  78   Temp: 96.8 F (36 C)  97.2 F (36.2 C)   TempSrc: Axillary  Axillary   Resp: 19  18   Height:      Weight:  105.1 kg (231 lb 11.3 oz)    SpO2: 91%  91% 89%    Intake/Output Summary (Last 24 hours) at 08/18/12 1045 Last data filed at 08/18/12 0900  Gross per 24 hour  Intake   1855 ml  Output    150 ml  Net   1705 ml   Filed Weights   08/16/12 0500 08/16/12 0540 08/18/12 0410  Weight: 106.7 kg (235 lb 3.7 oz) 106.8 kg (235 lb 7.2 oz) 105.1 kg (231 lb 11.3 oz)    Exam:   General:  Arouses to light touch,  combative NAD  Cardiovascular: regular, +murmur No LEE   Respiratory: normal effort BS diminished bases, otherwise clear. No wheeze/crackles  Abdomen: soft +BS non-tender to palpatoin  Neurological: does not follow commands, combative when trying to provide care. MAE speech clear  Data Reviewed: Basic  Metabolic Panel:  Recent Labs Lab 08/13/12 0547 08/14/12 0446 08/15/12 0501 08/16/12 0549 08/17/12 0441 08/18/12 0338  NA  --  140 137 139 142 143  K  --  4.7 4.5 4.0 3.6 3.9  CL  --  96 95* 97 99 102  CO2  --  35* 34* 32 33* 34*  GLUCOSE  --  156* 161* 152* 107* 123*  BUN  --  28* 37* 40* 36* 39*  CREATININE  --  0.97 1.16 1.24 1.11 1.07  CALCIUM  --  9.0 9.1 9.2 9.1 8.8  MG 2.1  --   --   --   --   --    Liver Function Tests: No results found for this basename: AST, ALT, ALKPHOS, BILITOT, PROT, ALBUMIN,  in the last 168 hours No results found for this basename: LIPASE, AMYLASE,  in the last 168 hours No results found for this basename: AMMONIA,  in the last 168 hours CBC:  Recent Labs Lab 08/12/12 1755 08/14/12 0446 08/15/12 0501 08/17/12 0441 08/18/12 0338  WBC 10.2 5.7 9.5 11.9* 13.0*  NEUTROABS 8.1*  --   --   --   --   HGB 11.8* 11.5* 11.0* 12.1* 12.3*  HCT 37.5* 36.8* 34.2* 38.3* 40.1  MCV 103.0* 101.7* 100.9* 101.6* 102.8*  PLT 167 175 201 252 239   Cardiac Enzymes:  Recent Labs Lab 08/12/12 1755  TROPONINI <0.30   BNP (last 3 results)  Recent Labs  08/03/12 0536 08/12/12 1755 08/17/12 0441  PROBNP 2605.0* 5479.0* 6458.0*   CBG:  Recent Labs Lab 08/17/12 0708 08/17/12 1109 08/17/12 1612 08/17/12 2047 08/18/12 0733  GLUCAP 129* 117* 115* 128* 113*    No results found for this or any previous visit (from the past 240 hour(s)).   Studies: No results found.  Scheduled Meds: . albuterol  2.5 mg Nebulization Q4H WA  . atorvastatin  10 mg Oral q1800  . carvedilol  6.25 mg Oral BID WC  . ceFEPime (MAXIPIME) IV  1 g Intravenous Q12H  . digoxin  0.125 mg Oral Daily  . furosemide  20 mg Oral BID  . guaiFENesin  1,200 mg Oral BID  . insulin aspart  0-15 Units Subcutaneous TID WC  . insulin aspart  0-5 Units Subcutaneous QHS  . ipratropium  0.5 mg Nebulization Q4H WA  . levofloxacin  500 mg Oral Daily  . lisinopril  2.5 mg Oral Daily  .  methylPREDNISolone (SOLU-MEDROL) injection  60 mg Intravenous Daily  . mometasone-formoterol  2 puff Inhalation BID  . nystatin  500,000 Units Oral TID AC & HS  . pantoprazole  80 mg Oral Daily  . potassium chloride  20 mEq Oral BID  . Tamsulosin HCl  0.4 mg Oral q morning - 10a   Continuous Infusions: . sodium chloride 30 mL/hr at 08/17/12 1820    Principal Problem:   Acute-on-chronic respiratory failure Active Problems:   COPD exacerbation   Anemia, iron deficiency   Atrial fibrillation, chronic   Acute on chronic diastolic congestive heart failure   Macrocytosis without anemia   Hyperglycemia, drug-induced   Acute respiratory failure with hypercapnia   Acute systolic congestive heart failure   Aortic stenosis, moderate  Pulmonary hypertension due to COPD   Tricuspid valve regurgitation   Acute on chronic systolic heart failure   Acute encephalopathy    Time spent: 30 minutes    [facility] M  [facility]  If 8PM-8AM, please contact night-coverage at [email or web address], password [facility] 08/18/2012, 10:45 AM  LOS: 6 days   Attending note:  Patient seen and examined. Above note reviewed.  He has had worsening agitation and delirium since yesterday. Etiology is not entirely clear.  He does not appear to have any source of infection, electrolytes appear to be in reasonable range, b12 and tsh normal.  It is possible that patient is developing hospital delirium.  He has received haldol and appears to be doing better this afternoon.  Will continue to follow.  From CHF standpoint, he is improved. Once mental status is improving, then he can likely discharge to SNF.

## 2012-08-18 NOTE — Progress Notes (Signed)
PT Cancellation Note  Patient Details Name: [name] MRN: [id number] DOB: 28-Apr-1922   Cancelled Treatment:    Reason Eval/Treat Not Completed:  (p is agitated and combative)   Konrad Penta 08/18/2012, 1:03 PM

## 2012-08-18 NOTE — Progress Notes (Signed)
Patient has been resting quietly this afternoon. Family and sitter at bedside.

## 2012-08-18 NOTE — Progress Notes (Signed)
SUBJECTIVE:Sleeping hard to arouse.  Principal Problem:   Acute-on-chronic respiratory failure Active Problems:   COPD exacerbation   Anemia, iron deficiency   Atrial fibrillation, chronic   Acute on chronic diastolic congestive heart failure   Macrocytosis without anemia   Hyperglycemia, drug-induced   Acute respiratory failure with hypercapnia   Acute systolic congestive heart failure   Aortic stenosis, moderate   Pulmonary hypertension due to COPD   Tricuspid valve regurgitation   Acute on chronic systolic heart failure   Acute encephalopathy   LABS: Basic Metabolic Panel:  Recent Labs  62/95/28 0441 08/18/12 0338  NA 142 143  K 3.6 3.9  CL 99 102  CO2 33* 34*  GLUCOSE 107* 123*  BUN 36* 39*  CREATININE 1.11 1.07  CALCIUM 9.1 8.8   CBC:  Recent Labs  08/17/12 0441 08/18/12 0338  WBC 11.9* 13.0*  HGB 12.1* 12.3*  HCT 38.3* 40.1  MCV 101.6* 102.8*  PLT 252 239    RADIOLOGY: No results found.   PHYSICAL EXAM BP 132/83  Pulse 78  Temp(Src) 97.2 F (36.2 C) (Axillary)  Resp 18  Ht 6\' 3"  (1.905 m)  Wt 231 lb 11.3 oz (105.1 kg)  BMI 28.96 kg/m2  SpO2 89% General: Well developed, well nourished, in no acute distress Head: Eyes PERRLA, No xanthomas.   Normal cephalic and atramatic  Lungs: Clear bilaterally to auscultation some upper airway congestion and coughing. Poor inspiratory effort. Heart: HRIR S1 S2, 1/6 systolic murmur. .  Pulses are 2+ & equal.            No carotid bruit. No JVD.  No abdominal bruits. No femoral bruits. Abdomen: Bowel sounds are positive, abdomen soft and non-tender without masses or                  Hernia's noted. Msk:  Back normal, Normal strength and tone for age. Extremities: No clubbing, cyanosis or edema.  DP +1 Neuro: Alert and oriented X 3. Psych:  Good affect, responds appropriately  TELEMETRY: Reviewed telemetry pt in Atrial fib in the 80's.  ASSESSMENT AND PLAN:  1. Acute on chronic systolic CHF: EF of 35  %  Which is significantly diminished from 55% last year. He has diuresed with 4 lb weight loss overnight and total wt loss of 9 lbs since admission with improvement in breathing status and edema. Now on po lasix 20 mg BID Creatinine 1.07. Pro-BNP is 6458 per yesterday's labs. Uncertain of dry wt. Wt on last discharge 238 lbs. Medication changes completed yesterday have been well tolerated. Can add spironolactone to regimen now that renal status is stable. 2. Atrial fibrillation: Heart rate is well controlled on dig and coreg. Not a coumadin candidate due to spontaneous retroperitoneal bleed.  4. Leukocytosis: Likely related to steroid inhalation, but has had foley catheter in place for several days. Will check UA for UTI with continued confusion. Not competed.  5. AMS: Having hallucinations, combativeness and some confusion as to place and time. Pulling off his oxygen. No evidence of metabolic encephalopathy on labs. Possibility of hypoxic encephalopathy can be considered. Will defer to PTH for further work up.  6. AAA: CT revealed aneurysm to be about 5cm on 07/2012 and a retroperitoneal hematoma on the left. It was felt that this likely represented a spontaneous retroperitoneal hematoma and not a ruptured AAA.  7. CAD: RCA stent, CABG in 2003 with mitral valve repair. No further invasive testing per family.  8. DNR   2.  [name]. Leonard Bishop NP [name] 08/18/2012, 9:23 AM  Patient examined and agree except changes made. Appears to be below discharge weight on last hospitalization. Agree with the addition of spironolactone. Not much more to add. Call us if further questions.  Leonard Castle, MD 08/18/2012 1:19 PM

## 2012-08-18 NOTE — Progress Notes (Addendum)
Patient continues to be agitated and combative this am. MD aware orders given. Skin tear to right arm while changing bed linen while patient fighting staff.

## 2012-08-19 ENCOUNTER — Inpatient Hospital Stay (HOSPITAL_COMMUNITY): Payer: Medicare Other

## 2012-08-19 ENCOUNTER — Other Ambulatory Visit (HOSPITAL_COMMUNITY): Payer: Medicare Other

## 2012-08-19 ENCOUNTER — Ambulatory Visit (HOSPITAL_COMMUNITY): Payer: Medicare Other | Admitting: Oncology

## 2012-08-19 LAB — CBC
HCT: 43.5 % (ref 39.0–52.0)
Hemoglobin: 13.3 g/dL (ref 13.0–17.0)
MCH: 31.7 pg (ref 26.0–34.0)
MCHC: 30.6 g/dL (ref 30.0–36.0)
MCV: 103.8 fL — ABNORMAL HIGH (ref 78.0–100.0)
Platelets: 223 10*3/uL (ref 150–400)
RBC: 4.19 MIL/uL — ABNORMAL LOW (ref 4.22–5.81)
RDW: 18.3 % — ABNORMAL HIGH (ref 11.5–15.5)
WBC: 14 10*3/uL — ABNORMAL HIGH (ref 4.0–10.5)

## 2012-08-19 LAB — GLUCOSE, CAPILLARY
Glucose-Capillary: 82 mg/dL (ref 70–99)
Glucose-Capillary: 161 mg/dL — ABNORMAL HIGH (ref 70–99)

## 2012-08-19 LAB — BASIC METABOLIC PANEL
Calcium: 8.7 mg/dL (ref 8.4–10.5)
Chloride: 103 mEq/L (ref 96–112)
Creatinine, Ser: 0.99 mg/dL (ref 0.50–1.35)
GFR calc Af Amer: 82 mL/min — ABNORMAL LOW (ref >90)
GFR calc non Af Amer: 71 mL/min — ABNORMAL LOW (ref >90)

## 2012-08-19 MED ORDER — PREDNISONE 20 MG PO TABS: 60.0000 mg | ORAL_TABLET | Freq: Every day | ORAL | Status: DC

## 2012-08-19 MED ORDER — FUROSEMIDE 20 MG PO TABS
20.0000 mg | ORAL_TABLET | Freq: Every day | ORAL | Status: DC
  Administered 2012-08-20: 20 mg via ORAL
  Filled 2012-08-19: qty 1

## 2012-08-19 MED ORDER — PREDNISONE 20 MG PO TABS
40.0000 mg | ORAL_TABLET | Freq: Every day | ORAL | Status: DC
  Administered 2012-08-20: 40 mg via ORAL
  Filled 2012-08-19: qty 2

## 2012-08-19 MED ORDER — PIPERACILLIN-TAZOBACTAM 3.375 G IVPB
3.3750 g | Freq: Three times a day (TID) | INTRAVENOUS | Status: DC
  Administered 2012-08-19 – 2012-08-20 (×2): 3.375 g via INTRAVENOUS
  Filled 2012-08-19 (×6): qty 50

## 2012-08-19 MED ORDER — VANCOMYCIN HCL 10 G IV SOLR
1250.0000 mg | Freq: Two times a day (BID) | INTRAVENOUS | Status: DC
  Administered 2012-08-20: 1250 mg via INTRAVENOUS
  Filled 2012-08-19 (×3): qty 1250

## 2012-08-19 MED ORDER — METHYLPREDNISOLONE SODIUM SUCC 125 MG IJ SOLR
60.0000 mg | Freq: Every day | INTRAMUSCULAR | Status: AC
  Administered 2012-08-19: 11:00:00 via INTRAVENOUS
  Filled 2012-08-19: qty 2

## 2012-08-19 MED ORDER — PIPERACILLIN-TAZOBACTAM 3.375 G IVPB
INTRAVENOUS | Status: AC
  Filled 2012-08-19: qty 100

## 2012-08-19 MED ORDER — OXYCODONE-ACETAMINOPHEN 5-325 MG PO TABS: 0.5000 | ORAL_TABLET | Freq: Three times a day (TID) | ORAL | Status: DC | PRN

## 2012-08-19 MED ORDER — VANCOMYCIN HCL 10 G IV SOLR
1500.0000 mg | Freq: Once | INTRAVENOUS | Status: AC
  Administered 2012-08-19: 1500 mg via INTRAVENOUS
  Filled 2012-08-19: qty 1500

## 2012-08-19 NOTE — Progress Notes (Signed)
ANTIBIOTIC CONSULT NOTE - INITIAL  Pharmacy Consult for Vancomycin & Zosyn Indication: rule out pneumonia  Allergies  Allergen Reactions  . Gabapentin     Patient cannot take over 200 mg dose at a time as it causes jerking or spasms    Patient Measurements: Height: 6\' 3"  (190.5 cm) Weight: 231 lb 11.3 oz (105.1 kg) IBW/kg (Calculated) : 84.5  Vital Signs:  Intake/Output from previous day: 02/25 0701 - 02/26 0700 In: 1794.5 [P.O.:720; I.V.:724.5; IV Piggyback:350] Out: -  Intake/Output from this shift: Total I/O In: 1040 [P.O.:1040] Out: 900 [Urine:900]  Labs:  Recent Labs  08/17/12 0441 08/18/12 0338 08/19/12 0446  WBC 11.9* 13.0* 14.0*  HGB 12.1* 12.3* 13.3  PLT 252 239 223  CREATININE 1.11 1.07 0.99   Estimated Creatinine Clearance: 67.6 ml/min (by C-G formula based on Cr of 0.99). No results found for this basename: VANCOTROUGH, VANCOPEAK, VANCORANDOM, GENTTROUGH, GENTPEAK, GENTRANDOM, TOBRATROUGH, TOBRAPEAK, TOBRARND, AMIKACINPEAK, AMIKACINTROU, AMIKACIN,  in the last 72 hours   Microbiology: No results found for this or any previous visit (from the past 720 hour(s)).  Medical History: Past Medical History  Diagnosis Date  . Mitral valve prolapse 2003    MVR in 2003  . Obesity   . Osteoarthritis   . Hypertension   . Hyperlipidemia   . COPD (chronic obstructive pulmonary disease)   . CHF (congestive heart failure) diastolic 10/2010    CXR in 12/2010: Prior CABG, cardiomegaly, vascular redistribution, bibasilar atelectasis, small effusions  . Pneumonia   . Impaired glucose tolerance   . Atrial fibrillation, chronic   . Prostate cancer     elevated PSA  . GERD (gastroesophageal reflux disease)     + hiatal hernia  . Arteriosclerotic cardiovascular disease (ASCVD) 1994    stent to RCA; CABG-2003  . Hard of hearing   . Anemia, iron deficiency   . AAA (abdominal aortic aneurysm)     Fusiform; infrarenal; 4-4.1cm on CT in 07/2008 and 12/2010 by MRI  .  Gastric ulcer 2004    2004; upper GI bleed  . Renal cysts, acquired, bilateral     Complex by MRI in 12/2010  . Adenomatous polyps 04/07/2012  . HYPERTENSION 12/17/2007    Subsequently hypotensive and all antihypertensive medication discontinued Lab  03/2012: Mild anemia with H&H-11.8/37.5, MCV-97, ferritin-224, normal CMet ex G-124, alb-3.4   . NSTEMI (non-ST elevated myocardial infarction) 01/18/2011  . Diabetes mellitus, type II 07/13/12    family denies patient is diabetic  . Macrocytosis without anemia 08/13/2012  . Acute systolic congestive heart failure 08/15/2012    EF 35-40%, per Echo  . Aortic stenosis, moderate 08/15/2012  . Pulmonary hypertension due to COPD 08/15/2012    53 mm Hg  . Tricuspid valve regurgitation 08/15/2012  . Retroperitoneal bleed 06/2012    Medications:  Scheduled:  . albuterol  2.5 mg Nebulization Q4H WA  . atorvastatin  10 mg Oral q1800  . carvedilol  6.25 mg Oral BID WC  . digoxin  0.125 mg Oral Daily  . [START ON 08/20/2012] furosemide  20 mg Oral Daily  . guaiFENesin  1,200 mg Oral BID  . insulin aspart  0-15 Units Subcutaneous TID WC  . insulin aspart  0-5 Units Subcutaneous QHS  . ipratropium  0.5 mg Nebulization Q4H WA  . lisinopril  2.5 mg Oral Daily  . [COMPLETED] methylPREDNISolone (SOLU-MEDROL) injection  60 mg Intravenous Daily  . mometasone-formoterol  2 puff Inhalation BID  . nystatin  500,000 Units Oral TID  AC & HS  . pantoprazole  80 mg Oral Daily  . piperacillin-tazobactam (ZOSYN)  IV  3.375 g Intravenous Q8H  . potassium chloride  20 mEq Oral BID  . [START ON 08/20/2012] predniSONE  40 mg Oral Q breakfast  . tamsulosin  0.4 mg Oral q morning - 10a  . [START ON 08/20/2012] vancomycin  1,250 mg Intravenous Q12H  . vancomycin  1,500 mg Intravenous Once  . [DISCONTINUED] ceFEPime (MAXIPIME) IV  1 g Intravenous Q12H  . [DISCONTINUED] furosemide  20 mg Oral BID  . [DISCONTINUED] methylPREDNISolone (SOLU-MEDROL) injection  60 mg Intravenous  Daily  . [DISCONTINUED] predniSONE  60 mg Oral Q breakfast   Assessment: Suspected HCAP  Goal of Therapy:  Vancomycin trough level 15-20 mcg/ml  Plan:  Vancomycin 1500 mg Loading dose, then 1250 mg IV every 12 hours Zosyn 3.375 GM IV every 8 hours Vancomycin trough at steady state Labs per protocol  [name], Lukas Pelcher Bennett 08/19/2012,6:35 PM

## 2012-08-19 NOTE — Clinical Social Work Note (Signed)
CSW updated PNC on pt's progress. Bed still available at this time. CSW to continue to follow.   [name], [place] [phone]

## 2012-08-19 NOTE — Progress Notes (Signed)
Physical Therapy Treatment Patient Details Name: [name] MRN: [id number] DOB: [birth year] Today's Date: 08/19/2012 Time: 4098-1191 PT Time Calculation (min): 25 min  PT Assessment / Plan / Recommendation Comments on Treatment Session   Pt more cooperative today.  PT able to complete bed mobility with mod I.  Pt refused to stand; Discharge remains appropriate as pt is significantly deconditioned.    Follow Up Recommendations  SNF     Does the patient have the potential to tolerate intense rehabilitation   no  Barriers to Discharge  needs snf      Equipment Recommendations    none   Recommendations for Other Services  OT  Frequency Min 3X/week   Plan Discharge plan remains appropriate    Precautions / Restrictions Precautions Precautions: Fall Restrictions Weight Bearing Restrictions: No   Pertinent Vitals/Pain 3/10    Mobility  Bed Mobility Bed Mobility: Rolling Right;Rolling Left;Supine to Sit;Scooting to [facility] Rolling Right: 4: Min assist Rolling Left: 4: Min assist Supine to Sit: 3: Mod assist Scooting to HOB: 4: Min assist Details for Bed Mobility Assistance: When sitting pt refused to get to chair or stand but did scoot himself to the [facility] with mod I.  Pt sat EOB x 4 min    Exercises General Exercises - Lower Extremity Ankle Circles/Pumps: AAROM;Both;10 reps Gluteal Sets: AAROM (bridge) Long Arc Quad: AROM;5 reps Heel Slides: AAROM;Both;10 reps Hip ABduction/ADduction: AAROM;Both;10 reps Straight Leg Raises: AAROM;Both;10 reps   PT Diagnosis:   difficulty walking PT Problem List:  weakness PT Treatment Interventions:   ex there activity  PT Goals Acute Rehab PT Goals PT Goal: Supine/Side to Sit - Progress: Met PT Goal: Sit to Supine/Side - Progress: Met PT Goal: Sit to Stand - Progress: Not met PT Goal: Stand to Sit - Progress: Not met PT Goal: Ambulate - Progress: Not met  Visit Information  Last PT Received On: 08/19/12    Subjective Data  Subjective: My left hip is tender   Cognition    drowsy but will follow command   Balance   Not tested  End of Session PT - End of Session Equipment Utilized During Treatment: Oxygen Activity Tolerance: Patient limited by fatigue Patient left: in bed;with call bell/phone within reach;with bed alarm set   GP     RUSSELL,CINDY 08/19/2012, 9:14 AM

## 2012-08-19 NOTE — Progress Notes (Signed)
[facility] PROGRESS NOTE  DELIO SLATES WUJ:[id number] DOB: 04-29-1922 DOA: 08/12/2012 PCP: [name], MD  Assessment/Plan: Acute encephalopathy: Etiology unclear. Improved this am.  May have underlying dementia, steroid associated, and quinolone associated delirium or developing hospital delirium. Levaquin discontinued 08/08/12.  Afebrile, VSS. Haldol given yesterday and pt improved.   Acute on chronic respiratory failure with chronic hypercapnia and hypoxia: See #1.  Seems to be baseline. Continue oxygen and monitor sats.   . Acute respiratory failure with hypercapnia:  Was secondary to COPD exacerbation and acute on chronic diastolic failure and newly diagnosed acute systolic heart failure. Will continue to wean down Solu-Medrol. See #2.   HCAP: patient has chest xray findings concerning for pneumonia. This, coupled with his leukocytosis may be contributing to his alteration in mental status. Will start vancomycin and zosyn per pharmacy.  I anticipate that we will be able to quickly transition him to po regimen.  Leukocytosis: urinalysis clean, afebrile. No indication of infection. Likely related to steroids and diuresis. Will taper steroids and decrease lasix.   Hypokalemia: mild. Likely related to diuresis. Will decrease lasix. Replete and recheck.   . Acute on chronic diastolic congestive heart failure/newly diagnosed acute systolic heart failure/pulmonary hypertension/tricuspid valve regurgitation. His 2-D echocardiogram this admission revealed a decrease in systolic function with an ejection fraction of 35-40%. ACE inhibitor therapy with lisinopril was started on 08/15/2012. Cardiology consulted. Changes in medications noted. Cardizem stopped and coreg started as well as digoxin. Continue lasix 20mg  po BID. Volume status +1.5L.   Marland Kitchen COPD exacerbation : Much improved . Pt refusing breathing treatments during night.  Continue oxygen, steroids but taper, antibiotics day #7  and  nebulizations. Will discontinue antibiotics after today's dose.    Drug-induced hyperglycemia, secondary to steroids. Continue sliding scale NovoLog and Lantus. CBG range 82-113. Monitor   Afib: chronic. His rate is controlled. See #1. Aspirin and Plavix were discontinued secondary to recurrent GI bleeding per history given by patient and daughter. Also, the patient was noted to have a large retroperitoneal bleed in January 2014.   Possible obstructive sleep apnea : See #1. Daughter indicates she and PCP in process of planning OP sleep study.   . Anemia : macrocytic. He has long-standing anemia from history of GI bleeding and recent retroperitoneal bleed.Marland Kitchen His B12 and TSH were within normal limits. His hemoglobin trending up but suspect due to diuresis. Will decrease lasix today. Continue PPI. Monitor  Mild azotemia. His BUN range 36-39. CO2 trending up. Will decrease lasix. Was incontinent of large amount urine yesterday.  Monitor         Code Status: full Family Communication: discussed with daughter at the bedside Disposition Plan: snf when ready, hopefully tomorrow   Consultants:  cardiology  Procedures:  none  Antibiotics: levaquin 08/13/12 >>> 08/18/12  cefipime 08/12/12 >>>08/19/12  HPI/Subjective: Awake, denies pain, cooperative with exam. Responds appropriately  Objective: Filed Vitals:   08/18/12 2048 08/18/12 2116 08/19/12 0653 08/19/12 0754  BP:  129/94 97/71   Pulse:  96 95   Temp:  97.3 F (36.3 C) 97.6 F (36.4 C)   TempSrc:  Axillary Axillary   Resp:  24 24   Height:      Weight:      SpO2: 94% 93% 97% 93%    Intake/Output Summary (Last 24 hours) at 08/19/12 0834 Last data filed at 08/19/12 0741  Gross per 24 hour  Intake 1794.5 ml  Output    250 ml  Net 1544.5 ml  Filed Weights   08/16/12 0500 08/16/12 0540 08/18/12 0410  Weight: 106.7 kg (235 lb 3.7 oz) 106.8 kg (235 lb 7.2 oz) 105.1 kg (231 lb 11.3 oz)    Exam:   General:  Awake,  cooperative conversing with visitor  Cardiovascular: regular +murmur No LEE  Respiratory: normal effort BSCTAB somewhat diminished in bases No wheeze  Abdomen: Obese soft +BS  Neuro: oriented to self/place. Responds appropriately to commands. Reluctant participant in exam but cooperative  Data Reviewed: Basic Metabolic Panel:  Recent Labs Lab 08/13/12 0547  08/15/12 0501 08/16/12 0549 08/17/12 0441 08/18/12 0338 08/19/12 0446  NA  --   < > 137 139 142 143 144  K  --   < > 4.5 4.0 3.6 3.9 3.4*  CL  --   < > 95* 97 99 102 103  CO2  --   < > 34* 32 33* 34* 37*  GLUCOSE  --   < > 161* 152* 107* 123* 78  BUN  --   < > 37* 40* 36* 39* 36*  CREATININE  --   < > 1.16 1.24 1.11 1.07 0.99  CALCIUM  --   < > 9.1 9.2 9.1 8.8 8.7  MG 2.1  --   --   --   --   --   --   < > = values in this interval not displayed. Liver Function Tests: No results found for this basename: AST, ALT, ALKPHOS, BILITOT, PROT, ALBUMIN,  in the last 168 hours No results found for this basename: LIPASE, AMYLASE,  in the last 168 hours No results found for this basename: AMMONIA,  in the last 168 hours CBC:  Recent Labs Lab 08/12/12 1755 08/14/12 0446 08/15/12 0501 08/17/12 0441 08/18/12 0338 08/19/12 0446  WBC 10.2 5.7 9.5 11.9* 13.0* 14.0*  NEUTROABS 8.1*  --   --   --   --   --   HGB 11.8* 11.5* 11.0* 12.1* 12.3* 13.3  HCT 37.5* 36.8* 34.2* 38.3* 40.1 43.5  MCV 103.0* 101.7* 100.9* 101.6* 102.8* 103.8*  PLT 167 175 201 252 239 223   Cardiac Enzymes:  Recent Labs Lab 08/12/12 1755  TROPONINI <0.30   BNP (last 3 results)  Recent Labs  08/03/12 0536 08/12/12 1755 08/17/12 0441  PROBNP 2605.0* 5479.0* 6458.0*   CBG:  Recent Labs Lab 08/17/12 1612 08/17/12 2047 08/18/12 0733 08/18/12 2102 08/19/12 0729  GLUCAP 115* 128* 113* 90 82    No results found for this or any previous visit (from the past 240 hour(s)).   Studies: No results found.  Scheduled Meds: . albuterol  2.5 mg  Nebulization Q4H WA  . atorvastatin  10 mg Oral q1800  . carvedilol  6.25 mg Oral BID WC  . ceFEPime (MAXIPIME) IV  1 g Intravenous Q12H  . digoxin  0.125 mg Oral Daily  . furosemide  20 mg Oral BID  . guaiFENesin  1,200 mg Oral BID  . insulin aspart  0-15 Units Subcutaneous TID WC  . insulin aspart  0-5 Units Subcutaneous QHS  . ipratropium  0.5 mg Nebulization Q4H WA  . lisinopril  2.5 mg Oral Daily  . methylPREDNISolone (SOLU-MEDROL) injection  60 mg Intravenous Daily  . mometasone-formoterol  2 puff Inhalation BID  . nystatin  500,000 Units Oral TID AC & HS  . pantoprazole  80 mg Oral Daily  . potassium chloride  20 mEq Oral BID  . tamsulosin  0.4 mg Oral q morning - 10a  Continuous Infusions: . sodium chloride 30 mL/hr at 08/19/12 0544    Principal Problem:   Acute-on-chronic respiratory failure Active Problems:   COPD exacerbation   Anemia, iron deficiency   Atrial fibrillation, chronic   Acute on chronic diastolic congestive heart failure   Macrocytosis without anemia   Hyperglycemia, drug-induced   Acute respiratory failure with hypercapnia   Acute systolic congestive heart failure   Aortic stenosis, moderate   Pulmonary hypertension due to COPD   Tricuspid valve regurgitation   Acute on chronic systolic heart failure   Acute encephalopathy    Time spent: 35 minutes    [facility] M  [facility]  If 8PM-8AM, please contact night-coverage at [email or web address], password [facility] 08/19/2012, 8:34 AM  LOS: 7 days   Attending note:   Patient seen and examined.  Agree with note as above.

## 2012-08-20 ENCOUNTER — Inpatient Hospital Stay
Admission: RE | Admit: 2012-08-20 | Discharge: 2012-08-27 | Disposition: A | Discharge status: Nursing Home (Includes ICF) | Payer: Medicare Other | Source: Ambulatory Visit | Attending: Internal Medicine | Admitting: Internal Medicine

## 2012-08-20 LAB — GLUCOSE, CAPILLARY
Glucose-Capillary: 80 mg/dL (ref 70–99)
Glucose-Capillary: 147 mg/dL — ABNORMAL HIGH (ref 70–99)

## 2012-08-20 LAB — CBC
HCT: 42.2 % (ref 39.0–52.0)
Hemoglobin: 13.1 g/dL (ref 13.0–17.0)
MCH: 31.9 pg (ref 26.0–34.0)
MCV: 102.7 fL — ABNORMAL HIGH (ref 78.0–100.0)
RBC: 4.11 MIL/uL — ABNORMAL LOW (ref 4.22–5.81)

## 2012-08-20 LAB — BASIC METABOLIC PANEL
CO2: 33 mEq/L — ABNORMAL HIGH (ref 19–32)
Calcium: 8.3 mg/dL — ABNORMAL LOW (ref 8.4–10.5)
Chloride: 103 mEq/L (ref 96–112)
Creatinine, Ser: 0.77 mg/dL (ref 0.50–1.35)
Glucose, Bld: 98 mg/dL (ref 70–99)

## 2012-08-20 MED ORDER — ATORVASTATIN CALCIUM 10 MG PO TABS: 10.0000 mg | ORAL_TABLET | Freq: Every day | ORAL | Status: DC

## 2012-08-20 MED ORDER — SPIRONOLACTONE 25 MG PO TABS: 25.0000 mg | ORAL_TABLET | Freq: Every day | ORAL | Status: DC

## 2012-08-20 MED ORDER — LISINOPRIL 2.5 MG PO TABS: 2.5000 mg | ORAL_TABLET | Freq: Every day | ORAL | Status: DC

## 2012-08-20 MED ORDER — PREDNISONE 10 MG PO TABS: ORAL_TABLET | ORAL | Status: DC

## 2012-08-20 MED ORDER — DIGOXIN 125 MCG PO TABS: 0.1250 mg | ORAL_TABLET | Freq: Every day | ORAL | Status: DC

## 2012-08-20 MED ORDER — ACETAMINOPHEN 325 MG PO TABS: 650.0000 mg | ORAL_TABLET | ORAL | Status: DC | PRN

## 2012-08-20 MED ORDER — CARVEDILOL 6.25 MG PO TABS: 6.2500 mg | ORAL_TABLET | Freq: Two times a day (BID) | ORAL | Status: DC

## 2012-08-20 MED ORDER — FUROSEMIDE 20 MG PO TABS: 20.0000 mg | ORAL_TABLET | Freq: Every day | ORAL | Status: DC

## 2012-08-20 MED ORDER — FUROSEMIDE 20 MG PO TABS: 20.0000 mg | ORAL_TABLET | Freq: Two times a day (BID) | ORAL | Status: DC

## 2012-08-20 MED ORDER — AMOXICILLIN-POT CLAVULANATE 875-125 MG PO TABS: 1.0000 | ORAL_TABLET | Freq: Two times a day (BID) | ORAL | Status: AC

## 2012-08-20 NOTE — Progress Notes (Signed)
INITIAL NUTRITION ASSESSMENT  DOCUMENTATION CODES Per approved criteria  -Not Applicable   INTERVENTION: Continue to current plan and RD will continue to follow   NUTRITION DIAGNOSIS: None at this time  Goal: Pt to meet >/= 90% of their estimated nutrition needs; met  Monitor:  Meal intake, wt trends and labs  Reason for Assessment: Malnutrition Screen  79 y.o. male  Admitting Dx: Acute-on-chronic respiratory failure  ASSESSMENT: Pt identified by malnutrition screen due to weight loss.  His appetite and po intake of recent meals have been good with po's 75-100%. His weight loss likely related to fluid changes. He is planning to d/c to [facility] later today. RD will follow him there.  He does not meet criteria for malnutrition at this time but is at risk due to chronic dz and multiple hospitalizations during past 6 months.   Height: Ht Readings from Last 1 Encounters:  08/13/12 6\' 3"  (1.905 m)    Weight: Wt Readings from Last 1 Encounters:  08/18/12 231 lb 11.3 oz (105.1 kg)    Ideal Body Weight: 196# (89 kg)  % Ideal Body Weight: 118%  Wt Readings from Last 10 Encounters:  08/18/12 231 lb 11.3 oz (105.1 kg)  08/04/12 238 lb 5.1 oz (108.1 kg)  07/28/12 238 lb 5.1 oz (108.1 kg)  07/14/12 246 lb 0.5 oz (111.6 kg)  05/27/12 240 lb (108.863 kg)  05/13/12 235 lb (106.595 kg)  05/06/12 235 lb (106.595 kg)  04/13/12 229 lb 9.6 oz (104.146 kg)  03/31/12 224 lb (101.606 kg)  03/30/12 224 lb (101.606 kg)    Usual Body Weight: 225-230#   % Usual Body Weight: 101%  BMI:  Body mass index is 28.96 kg/(m^2). Overweight  Estimated Nutritional Needs: Kcal: 1808-2000 Protein:95-105 gr Fluid: > 2000 ml/d  Skin: skin tear arm  Diet Order: Cardiac  EDUCATION NEEDS: -No education needs identified at this time   Intake/Output Summary (Last 24 hours) at 08/20/12 1506 Last data filed at 08/20/12 1307  Gross per 24 hour  Intake   1280 ml  Output    600 ml  Net    680 ml     Last BM: incontinent, 08/19/12 loose watery stool  Labs:   Recent Labs Lab 08/18/12 0338 08/19/12 0446 08/20/12 0450  NA 143 144 141  K 3.9 3.4* 3.7  CL 102 103 103  CO2 34* 37* 33*  BUN 39* 36* 32*  CREATININE 1.07 0.99 0.77  CALCIUM 8.8 8.7 8.3*  GLUCOSE 123* 78 98    CBG (last 3)   Recent Labs  08/19/12 2057 08/20/12 0721 08/20/12 1139  GLUCAP 134* 80 96    Scheduled Meds: . albuterol  2.5 mg Nebulization Q4H WA  . atorvastatin  10 mg Oral q1800  . carvedilol  6.25 mg Oral BID WC  . digoxin  0.125 mg Oral Daily  . furosemide  20 mg Oral Daily  . guaiFENesin  1,200 mg Oral BID  . insulin aspart  0-15 Units Subcutaneous TID WC  . insulin aspart  0-5 Units Subcutaneous QHS  . ipratropium  0.5 mg Nebulization Q4H WA  . lisinopril  2.5 mg Oral Daily  . mometasone-formoterol  2 puff Inhalation BID  . nystatin  500,000 Units Oral TID AC & HS  . pantoprazole  80 mg Oral Daily  . potassium chloride  20 mEq Oral BID  . predniSONE  40 mg Oral Q breakfast  . tamsulosin  0.4 mg Oral q morning - 10a  Continuous Infusions: . sodium chloride 30 mL/hr at 08/19/12 0544    Past Medical History  Diagnosis Date  . Mitral valve prolapse 2003    MVR in 2003  . Obesity   . Osteoarthritis   . Hypertension   . Hyperlipidemia   . COPD (chronic obstructive pulmonary disease)   . CHF (congestive heart failure) diastolic 10/2010    CXR in 12/2010: Prior CABG, cardiomegaly, vascular redistribution, bibasilar atelectasis, small effusions  . Pneumonia   . Impaired glucose tolerance   . Atrial fibrillation, chronic   . Prostate cancer     elevated PSA  . GERD (gastroesophageal reflux disease)     + hiatal hernia  . Arteriosclerotic cardiovascular disease (ASCVD) 1994    stent to RCA; CABG-2003  . Hard of hearing   . Anemia, iron deficiency   . AAA (abdominal aortic aneurysm)     Fusiform; infrarenal; 4-4.1cm on CT in 07/2008 and 12/2010 by MRI  . Gastric ulcer 2004     2004; upper GI bleed  . Renal cysts, acquired, bilateral     Complex by MRI in 12/2010  . Adenomatous polyps 04/07/2012  . HYPERTENSION 12/17/2007    Subsequently hypotensive and all antihypertensive medication discontinued Lab  03/2012: Mild anemia with H&H-11.8/37.5, MCV-97, ferritin-224, normal CMet ex G-124, alb-3.4   . NSTEMI (non-ST elevated myocardial infarction) 01/18/2011  . Diabetes mellitus, type II 07/13/12    family denies patient is diabetic  . Macrocytosis without anemia 08/13/2012  . Acute systolic congestive heart failure 08/15/2012    EF 35-40%, per Echo  . Aortic stenosis, moderate 08/15/2012  . Pulmonary hypertension due to COPD 08/15/2012    53 mm Hg  . Tricuspid valve regurgitation 08/15/2012  . Retroperitoneal bleed 06/2012    Past Surgical History  Procedure Laterality Date  . Knee surgery      Left  . Bladder surgery    . Femoral artery stent  12-06-10    Left SFA  . Appendectomy    . Mitral valve replacement (mvr)/coronary artery bypass grafting (cabg)  2003    stent to RCA in 1994  . Eye surgery  2007    bilateral cataracts  . Tonsillectomy      thinks they were removed while in the [employer]  . Mass excision  06/07/2011    Procedure: EXCISION MASS;  Surgeon: Fabio Bering;  Location: [facility];  Service: General;  Laterality: N/A;  excision of 2 masses back and buttocks  . Back surgery    . Esophagogastroduodenoscopy  03/03/2003    Large, deep prepyloric ulcer, as described above without bleeding/ Normal esophagus  . Esophagogastroduodenoscopy  09/03/2004    normal throughout  . Colonoscopy  09/03/2004    small ulcer, without stigmata of bleeding  . Esophagogastroduodenoscopy  11/14/2004    Normal esophagus and small hiatal hernia, otherwise normal stomach   . Colonoscopy  11/14/2004    Internal hemorrhoids, otherwise normal rectum/ left-sided diverticula , diffusely oozing right colon mucosa without a discrete lesion amenable to endoscopic therapy. 2  diminutive polyps. FELT TO HAVE AVMs/telangiectasias  . Colonoscopy  01/29/2005    Normal rectum  . Colonoscopy  03/11/2012    Colonic diverticulosis. Colonic polyps-removed as described above. Vascular anomalies in the cecum likely representing hemangiomas. Status post hemostasis clipping of  2 of the 3. ADENOMATOUS POLYPS. Repeat 2016  . Esophagogastroduodenoscopy  03/11/2012    Deformity of the antrum;  small polyp in antrum-not manipulated. Otherwise normal exam  Colman Cater MS,RD,LDN,CSG Office: [phone] Pager: [phone]

## 2012-08-20 NOTE — Discharge Summary (Signed)
Physician Discharge Summary  GIL INGWERSEN WUJ:[id number] DOB: [birth year] DOA: 08/12/2012  PCP: [name], MD  Admit date: 08/12/2012 Discharge date: 08/20/2012  Time spent: 50 minutes  Recommendations for Outpatient Follow-up:  1. PCP 1 week. Recommend bmet in 1 week evaluate electrolytes. Chest xray in 3 weeks to ensure clearing. 2. Follow up with cardiologist in 1-2 weeks 3. Daily weights.  4. Lasix can be titrated up to 40mg  bid if blood pressure allows.  Discharge Diagnoses:  Principal Problem:   Acute-on-chronic respiratory failure Active Problems:   COPD exacerbation   Anemia, iron deficiency   Atrial fibrillation, chronic   Acute on chronic diastolic congestive heart failure   Macrocytosis without anemia   Hyperglycemia, drug-induced   Acute respiratory failure with hypercapnia   Acute systolic congestive heart failure   Aortic stenosis, moderate   Pulmonary hypertension due to COPD   Tricuspid valve regurgitation   Acute on chronic systolic heart failure   Acute encephalopathy   Discharge Condition: stable and ready for discharge  Diet recommendation: heart healthy   Filed Weights   08/16/12 0500 08/16/12 0540 08/18/12 0410  Weight: 106.7 kg (235 lb 3.7 oz) 106.8 kg (235 lb 7.2 oz) 105.1 kg (231 lb 11.3 oz)    History of present illness:  ANSH FAUBLE is an 79 y.o. male. Obese Caucasian, with a history, multiple medical problems including, recent leaking AAA, s/p repair, COPD, atrial fibrillation, diastolic heart failure, recently discharged to home from the hospitalist service, after treatment for decompensated diastolic heart failure. The patient was stable at discharge, but because no beds were available at [facility], where he was admitted, he elected to go home. He had to be assisted into his home by EMS, because of fall and weakness at his door, and since being home has been noncompliant with dietary regimen, eating salami and such , has been  having progressive swelling and progressive shortness of breath. He deteriorated to the point that he was brought to the emergency room by EMS on 08/12/12, required BiPAP, Lasix, and nebulizers for stabilization in the emergency room, and the hospitalist service was called to assist.  His daughter who accompanied him noted that he frequently stops breathing during his sleep, and she noted his oxygen saturation drops into the 80's. He snores heavily and has daytime somnolence.  Usually uses 2 L of oxygen at home  After BiPAP, Lasix, and nebs, daughter said he is improved 200% better, but not well enough to go   Hospital Course:  Acute encephalopathy: unclear etiology. May have been related to transient hypoxia since patient repeatedly removes his oxygen vs. Hospital delirium.  He did receive one dose of haldol and it appears that his mental status has since returned to baseline.    HCAP:  patient has chest xray findings concerning for pneumonia. This, coupled with his leukocytosis may be contributing to his alteration in mental status. Started vancomycin and zosyn per pharmacy. At discharge patient transitioned to po antibiotics for 7 days with augmentin. Did not choose levaquin since it was thought that patient my have a quinolone induced delirium. Pt afebrile and non-toxic appearing.   Acute on chronic respiratory failure with chronic hypercapnia and hypoxia: Resolved at discharge. CO2 level on ABG 47.3 on 2/24. This is his baseline.     . Acute respiratory failure with hypercapnia:Secondary to COPD exacerbation and acute on chronic diastolic failure and newly diagnosed acute systolic heart failure.  Provided with steroids, antibiotics, and oxygen and lasix.  He gradually improved. Status post BiPAP. .   . Acute on chronic diastolic congestive heart failure/newly diagnosed acute systolic heart failure/pulmonary hypertension/tricuspid valve regurgitation. His 2-D echocardiogram this admission revealed a  decrease in systolic function with an ejection fraction of 35-40%. ACE inhibitor therapy with lisinopril was started on 08/15/2012. Cardiology consulted. Changes in medications noted. cardizem stopped and coreg started as well as digoxin. He is currently on lasix 20mg  po bid as well as spironolactone. His lasix can be titrated up to 40mg  daily if blood pressure allows.  Marland Kitchen COPD exacerbation : Provided with oxygen, steroids, antibiotics, and nebulizations. Solu-Medrol was titrated.Will discharge with prednisone taper.   Drug-induced hyperglycemia, secondary to steroids. Used sliding scale NovoLog and Lantus. CBG's responded to steroids being tapered. At discharge CBG's controlled.   Afib: chronic. Seen by cardiology. Aspirin and Plavix were discontinued secondary to recurrent GI bleeding per history given by patient and daughter. Also, the patient was noted to have a large retroperitoneal bleed in January 2014. At discharge rate controlled.   Possible obstructive sleep apnea : See #1. Daughter indicates she and PCP in process of planning OP sleep study.   . Anemia : macrocytic. He has long-standing anemia from history of GI bleeding and recent retroperitoneal bleed.Marland Kitchen His B12 and TSH were within normal limits. His hemoglobin is more or less stable. Continue PPI.   Mild azotemia.  Resolved at discharge. Recommend bmet in 1 week to evaluate renal function       Procedures: 2D echocardiogram 08/13/2012:  - Left ventricle: Diffuse hypokinesis worse in the inferior wall The cavity size was normal. There was moderate concentric hypertrophy. Systolic function was moderately reduced. The estimated ejection fraction was in the range of 35% to 40%. - Aortic valve: Moderately calcified with restricted motion. Suspect moderate AS given degree of LV dysfunction CW not adequate Have spoken to tech to repeat - Mitral valve: Tissue MVR not well seen Diastolic gradient are a bit high No MR Valve area by  pressure half-time: 1.71cm^2. - Left atrium: The atrium was moderately dilated. - Right ventricle: The cavity size was severely dilated. - Right atrium: The atrium was mildly dilated. - Atrial septum: No defect or patent foramen ovale was identified. - Tricuspid valve: Moderate regurgitation. - Pulmonary arteries: PA peak pressure: 53mm Hg (S). Transthoracic echocardiography. M-mode, complete 2D, spectral Doppler, and color Doppler. Height: Height: 190.5cm. Height: 75in. Weight: Weight: 103kg. Weight: 226.5lb. Body mass index: BMI: 28.4kg/m^2. Body surface area: BSA: 2.53m^2. Patient status: Inpatient. Location: ICU/CCU     Consultations:  cardiology  Discharge Exam: Filed Vitals:   08/20/12 0639 08/20/12 0653 08/20/12 1030 08/20/12 1104  BP:  92/78 90/62   Pulse:  76 108   Temp:  97.6 F (36.4 C) 97.4 F (36.3 C)   TempSrc:  Oral Oral   Resp:  20 20   Height:      Weight:      SpO2: 92% 94% 92% 88%    General: awake alert up in chair Cardiovascular: RRR +murmur  Respiratory: normal effort BS slightly diminished in bases. Otherwise clear. No wheeze  Discharge Instructions       Future Appointments Provider Department Dept Phone   10/19/2012 11:30 AM [facility] [place] [facility] AT [facility] [phone]   Patient to arrive 15 minutes prior to appointment time.   10/19/2012 12:00 PM [facility] [place] [facility] AT [facility] [phone]   10/19/2012 1:00 PM Nada Libman, MD [facility] [phone]       Medication List    STOP taking these medications       diltiazem 120 MG tablet  Commonly known as:  CARDIZEM     lovastatin 40 MG tablet  Commonly known as:  MEVACOR      TAKE these medications       acetaminophen 325 MG tablet  Commonly known as:  TYLENOL  Take 2 tablets (650 mg total) by mouth every 4 (four) hours as needed.     albuterol (2.5 MG/3ML) 0.083% nebulizer solution   Commonly known as:  PROVENTIL  Take 3 mLs (2.5 mg total) by nebulization every 6 (six) hours as needed. Mixes with the Ipratropium(for shortness of breath)     amoxicillin-clavulanate 875-125 MG per tablet  Commonly known as:  AUGMENTIN  Take 1 tablet by mouth 2 (two) times daily.     atorvastatin 10 MG tablet  Commonly known as:  LIPITOR  Take 1 tablet (10 mg total) by mouth daily at 6 PM.     benzonatate 100 MG capsule  Commonly known as:  TESSALON  Take 100 mg by mouth every 6 (six) hours as needed for cough.     carvedilol 6.25 MG tablet  Commonly known as:  COREG  Take 1 tablet (6.25 mg total) by mouth 2 (two) times daily with a meal.     digoxin 0.125 MG tablet  Commonly known as:  LANOXIN  Take 1 tablet (0.125 mg total) by mouth daily.     Fluticasone-Salmeterol 100-50 MCG/DOSE Aepb  Commonly known as:  ADVAIR  Inhale 1 puff into the lungs every 12 (twelve) hours.     furosemide 20 MG tablet  Commonly known as:  LASIX  Take 1 tablet (20 mg total) by mouth 2 (two) times daily.     guaiFENesin 600 MG 12 hr tablet  Commonly known as:  MUCINEX  Take 600 mg by mouth 2 (two) times daily as needed. congestion     ipratropium 0.02 % nebulizer solution  Commonly known as:  ATROVENT  Take 500 mcg by nebulization every 6 (six) hours as needed. Shortness of breath     lisinopril 2.5 MG tablet  Commonly known as:  PRINIVIL,ZESTRIL  Take 1 tablet (2.5 mg total) by mouth daily.     multivitamin with minerals Tabs  Take 1 tablet by mouth daily.     nystatin 100000 UNIT/ML suspension  Commonly known as:  MYCOSTATIN  Take 500,000 Units by mouth 4 (four) times daily.     omeprazole 40 MG capsule  Commonly known as:  PRILOSEC  Take 40 mg by mouth every morning.     oxymetazoline 0.05 % nasal spray  Commonly known as:  AFRIN  Place 1 spray into the nose daily as needed. Congestion     potassium chloride SA 20 MEQ tablet  Commonly known as:  K-DUR,KLOR-CON  Take 20 mEq  by mouth 2 (two) times daily.     predniSONE 10 MG tablet  Commonly known as:  DELTASONE  Take 3 tabs 2/28 and 3/1, take 2 tabs on 3/2 and 3/3 take 1 tabs 3/4 and 3/5  then stop.     spironolactone 25 MG tablet  Commonly known as:  ALDACTONE  Take 1 tablet (25 mg total) by mouth daily.     tamsulosin 0.4 MG Caps  Commonly known as:  FLOMAX  Take 0.4 mg by mouth every morning.       Follow-up Information  Follow up with [name], MD. Schedule an appointment as soon as possible for a visit in 1 week.   Contact information:   [address] [place] [address] [phone]        The results of significant diagnostics from this hospitalization (including imaging, microbiology, ancillary and laboratory) are listed below for reference.    Significant Diagnostic Studies: Ct Abdomen Pelvis Wo Contrast  07/22/2012  *RADIOLOGY REPORT*  Clinical Data: Retroperitoneal bleed  CT ABDOMEN AND PELVIS WITHOUT CONTRAST  Technique:  Multidetector CT imaging of the abdomen and pelvis was performed following the standard protocol without intravenous contrast.  Comparison: 07/13/2012  Findings: Cardiomegaly.  Mitral valve replacement.  Trace left pleural effusion.  Bibasilar opacities, likely atelectasis.  Organ abnormality/lesion detection is limited in the absence of intravenous contrast. Within this limitation, lobular liver contour.  Gallstones.  Splenic granuloma.  Unremarkable pancreas and adrenal glands.  Bilateral retained renal contrast with delayed excretion. Bilateral renal cystic lesions, some of which are cysts and some which are incompletely characterized.  No hydronephrosis or hydroureter  Colonic diverticulosis. Decompressed small bowel loops. Surgical clip right lower quadrant may reflect appendectomy.  Retroperitoneal hematoma centered on the left, measuring up to 12 x 10.7 cm in axial dimensions (series 2 image 47) and 21.6 cm craniocaudal on coronal image 62,  similar to slightly larger since the prior allowing for differences in slice selection.  Again, the hematoma is noted to extend anterior to the aorta, with small amount collecting along the small bowel mesenteric root and right retroperitoneum. The quantity collecting on the right has increased in the interval though remains ill defined/difficult to accurately measure.  Superiorly, hematoma abuts the posterior margin of the spleen.  Inferiorly, the hematoma extends along the left greater than right iliac vasculature. There is a preserved fat plane separating the majority of the hematoma from the aorta, with the exception of the component crossing midline, which is inseparable from the anterior margin of the aorta.  Scattered atherosclerosis of the aorta and branch vessels. Infrarenal aneurysm measures up to 5 cm in diameter, unchanged.  Contrast collects within the bladder.  Multiple bladder diverticula.  Circumferential wall thickening.  TURP defect. Lobular prostate gland.  Osteopenia and multilevel degenerative changes without acute osseous finding.  IMPRESSION: Large retroperitoneal hematoma centered on the left. The dominant component is similar to minimally larger in size. There is a small amount of blood crossing midline and tracking along the right retroperitoneum, increased in the interval.  Unchanged differential.  Discussed via telephone with Dr. [name] Gianotti at 05:00 a.m. on 07/22/2012.  Retained contrast within the kidneys bilaterally.  Correlate with serial creatinine for contrast induced nephropathy.  Circumferential bladder wall thickening with multiple diverticula suggest chronic partial outlet obstruction.   Original Report Authenticated By: Jearld Lesch, M.D.    Ct Angio Chest Pe W/cm &/or Wo Cm  07/21/2012  *RADIOLOGY REPORT*  Clinical Data:  79 year old male with chest, abdominal and pelvic pain.  History of mitral valve replacement and GI bleed.  The patient is not currently on  anticoagulation.  CT ANGIOGRAPHY CHEST, ABDOMEN AND PELVIS  Technique:  Multidetector CT imaging through the chest, abdomen and pelvis was performed using the standard protocol during bolus administration of intravenous contrast.  Multiplanar reconstructed images including MIPs were obtained and reviewed to evaluate the vascular anatomy.  Contrast: OMNIPAQUE IOHEXOL 350 MG/ML SOLN  Comparison:  07/09/2011  CTA CHEST  Findings:  Cardiomegaly and mitral valve replacement noted.  Moderate coronary artery calcifications are present. And ascending thoracic aortic aneurysm is noted and unchanged measuring 5 cm in greatest diameter.  There is no evidence of dissection.  A small left pleural effusion is noted. There is no evidence of right pleural effusion or pericardial effusion. No enlarged lymph nodes are identified.  Bibasilar atelectasis is noted, left greater than right. There is no evidence of airspace disease, consolidation, mass or endobronchial/endotracheal lesions.   Review of the MIP images confirms the above findings.  IMPRESSION: Unchanged cardiomegaly and ascending thoracic aortic aneurysm measuring 5 cm.  No evidence of dissection or rupture.  Small left pleural effusion with mild bibasilar atelectasis, left greater than right.  CTA ABDOMEN AND PELVIS  Findings:  A large left retroperitoneal hematoma involving the iliopsoas muscle is noted measuring 10 x 10 x 20 cm.  This hematoma lies extremely close to the unchanged 5 cm infrarenal suprailiac abdominal aortic aneurysm, but no definite connection, contrast extravasation, or irregularity of the aortic aneurysm wall is identified.  There is diffuse enlargement of the left iliopsoas musculature rather than displacement. This hematoma is favored to represent a spontaneous retroperitoneal hematoma, but given history of no anticoagulation, an abdominal aortic rupture without evidence of active arterial extravasation is not entirely excluded.  Cirrhosis is  identified. Gallbladder sludge versus stones noted. A 2.2 x 2.5 cm anterior slightly hyperdense splenic lesion is nonspecific but statistically benign. Renal atrophy and bilateral renal cysts are present. The pancreas is unremarkable.  There is no evidence of biliary dilatation, enlarged lymph nodes, or free fluid. The bladder and bowel are within normal limits. No acute or suspicious bony abnormalities are identified. Degenerative changes throughout the spine are present.   Review of the MIP images confirms the above findings.  IMPRESSION: 10 x 10 x 20 cm left retroperitoneal hematoma adjacent to the a 5 cm infrarenal suprailiac abdominal aortic aneurysm.  Given the characteristics discussed above, a spontaneous retroperitoneal hematoma is favored.  A small focal abdominal aortic aneurysm rupture without contrast extravasation, wall irregularity or identifiable connection is a possibility given the fact that this patient is not on anticoagulation.  Cirrhosis.  Gallbladder sludge versus cholelithiasis.  These results were called to Dr. Estell Harpin on 07/21/2012 at 9:15 a.m.   Original Report Authenticated By: Harmon Pier, M.D.    Ct Abdomen Pelvis W Contrast  07/31/2012  *RADIOLOGY REPORT*  Clinical Data: Abdominal pain.  Recent aneurysm surgery and retroperitoneal hematoma.  CT ABDOMEN AND PELVIS WITH CONTRAST  Technique:  Multidetector CT imaging of the abdomen and pelvis was performed following the standard protocol during bolus administration of intravenous contrast.  Contrast: 50mL OMNIPAQUE IOHEXOL 300 MG/ML  SOLN, OMNIPAQUE IOHEXOL 300 MG/ML  SOLN 11/31/2014  Comparison: Multiple exams, including 07/24/2012  Findings: Small left and moderate right pleural effusions with associated passive atelectasis noted.  There is evidence of old granulomatous disease of the lung bases, four-chamber cardiomegaly, and mitral valve calcification.  Atherosclerosis noted.  Left upper quadrant cystic lesion unchanged.   Bilateral renal cysts, unchanged.  Dependent density in the gallbladder may represent stones.  No specific hepatic abnormality observed.  Hematoma inseparable from the psoas muscle noted tracking along the posterior renal fascia, with mixed density.  Size not appreciably changed.  This tracks down into the upper part of the pelvis.  Stable infrarenal abdominal aortic aneurysm.  Stable atherosclerosis.  No new hemorrhage observed.  Urinary bladder unremarkable.  Levoconvex lumbar scoliosis noted with rotary component.  Umbilical hernia contains adipose tissue,  stable.  No dilated bowel observed.  Lumbar spondylosis results in multilevel foraminal impingement.  IMPRESSION:  1.  Overall stable size and appearance of left retroperitoneal hematoma. 2.  Small left and moderate right pleural effusions with passive atelectasis. 3.  Four-chamber cardiomegaly. 4.  Atherosclerosis. 5.  Bilateral renal cystic lesions, unchanged. 6.  Stable infrarenal abdominal aortic aneurysm. 7.  Small umbilical hernia contains adipose tissue. 8.  Lumbar spondylosis with multilevel chronic foraminal impingement.   Original Report Authenticated By: Gaylyn Rong, M.D.    Dg Chest Port 1 View  08/19/2012  *RADIOLOGY REPORT*  Clinical Data: Possible infection, CHF, atrial fibrillation, prostate carcinoma  PORTABLE CHEST - 1 VIEW  Comparison: Portable chest x-ray of 08/14/2012  Findings: There do appear to be somewhat prominent markings at the right lung base, and pneumonia is a consideration.  No effusion is seen.  Moderate cardiomegaly is stable.  Median sternotomy sutures are noted.  IMPRESSION: Prominent markings at the right lung base may represent pneumonia. Stable moderate cardiomegaly.   Original Report Authenticated By: Dwyane Dee, M.D.    Dg Chest Port 1 View  08/14/2012  *RADIOLOGY REPORT*  Clinical Data: Respiratory failure and COPD.  Diastolic dysfunction  PORTABLE CHEST - 1 VIEW  Comparison: 08/12/2012  Findings: Cardiac  enlargement.  Pulmonary vascular congestion and mild edema has improved since the prior study.  There is mild atelectasis in the lung bases.  No significant effusion however of the right lung base has been clipped on the image.  IMPRESSION: Mild improvement in pulmonary vascular congestion and fluid overload.   Original Report Authenticated By: Janeece Riggers, M.D.    Dg Chest Portable 1 View  08/12/2012  *RADIOLOGY REPORT*  Clinical Data: Shortness of breath.  PORTABLE CHEST - 1 VIEW  Comparison: 08/01/2012.  Findings: The heart is enlarged but stable.  Prominent mediastinal and hilar contours are unchanged.  Persistent vascular congestion and chronic scarring changes.  No definite infiltrates, effusions or edema.  IMPRESSION:  Stable cardiac enlargement with mild vascular congestion but no overt pulmonary edema.   Original Report Authenticated By: Rudie Meyer, M.D.    Dg Chest Port 1 View  08/01/2012  *RADIOLOGY REPORT*  Clinical Data: 79 year old male congestive heart failure, shortness of breath.  PORTABLE CHEST - 1 VIEW  Comparison: 07/31/2012 and earlier.  Findings: Portable upright AP view 1116 hours. Stable cardiomegaly and mediastinal contours.  Interval decreased bilateral pleural effusions and improved bibasilar ventilation.  Stable vascularity, no overt edema.  No pneumothorax.  Retrocardiac density is stable, favor related to chronic atelectasis.  IMPRESSION: Improved bibasilar ventilation with decreased pleural effusions. No acute edema.  Chronic retrocardiac atelectasis.   Original Report Authenticated By: Erskine Speed, M.D.    Dg Chest Portable 1 View  07/31/2012  *RADIOLOGY REPORT*  Clinical Data: Shortness of breath.  PORTABLE CHEST - 1 VIEW  Comparison: 07/23/2012  Findings: Mild congestive heart failure and small bilateral pleural effusions are suspected.  Heart is moderately enlarged.  IMPRESSION: Mild CHF with small bilateral pleural effusions.   Original Report Authenticated By: Irish Lack, M.D.    Dg Chest Port 1 View  07/23/2012  *RADIOLOGY REPORT*  Clinical Data: Follow up pleural effusion and atelectasis  PORTABLE CHEST - 1 VIEW  Comparison: 07/22/2012  Findings: The patient is status post median sternotomy and valve replacement.  Cardiomegaly is again identified and appears unchanged in degree.  Aortic ectasia with aortic calcification is also unchanged.  The right costophrenic angle has been excluded from  the film making evaluation for small amount of right pleural fluid suboptimal.  No gross pleural effusion is seen on the right.  Persistent density at the left lung base is noted and likely represents atelectasis combined with a small amount of pleural fluid.  The possibility of an underlying infiltrate is not excluded.  Pulmonary vascular congestion without signs of overt congestive failure are noted.  IMPRESSION: Excluded right costophrenic angle.  Stable cardiomegaly, mild pulmonary vascular congestion and left lower lobe atelectasis/effusion.   Original Report Authenticated By: Rhodia Albright, M.D.    Dg Chest Port 1 View  07/22/2012  *RADIOLOGY REPORT*  Clinical Data: Left pleural effusion.  PORTABLE CHEST - 1 VIEW  Comparison: 01/28 and 07/13/2012  Findings: There is chronic cardiomegaly.  Pulmonary vascularity is now normal.  Right lung is clear.  Increased density at the left lung base consistent with a combination of pleural effusion and atelectasis.  IMPRESSION:  Increased atelectasis/small effusion at the left lung base. Pulmonary vascularity is now normal.   Original Report Authenticated By: Francene Boyers, M.D.    Dg Chest Portable 1 View  07/21/2012  *RADIOLOGY REPORT*  Clinical Data: Chest pain.  PORTABLE CHEST - 1 VIEW  Comparison: 07/13/2012.  Findings: Cardiomegaly.  Slight improved aeration.  Elevated left hemidiaphragm.  Some clearing of edema and atelectasis.  Calcified tortuous aorta.  Prior median sternotomy.  Mild vascular congestion.  IMPRESSION:  Cardiomegaly.  Mild vascular congestion.  Slight improvement aeration from previous radiograph.   Original Report Authenticated By: Davonna Belling, M.D.    Ct Angio Abd/pel W/ And/or W/o  07/24/2012  *RADIOLOGY REPORT*  Clinical Data: Retroperitoneal hematoma  CT ANGIOGRAPHY ABDOMEN AND PELVIS  Technique:  Multidetector CT imaging of the abdomen and pelvis was performed using the standard protocol before and during bolus administration of intravenous contrast.  Multiplanar reconstructed images including MIPs were obtained and reviewed to evaluate the vascular anatomy.  Contrast: OMNIPAQUE IOHEXOL 350 MG/ML SOLN  Comparison:  07/22/2012  Arterial findings: Aorta:                  Scattered calcified plaque in the visualized distal descending thoracic and suprarenal abdominal aorta.  A fusiform infrarenal aneurysm measuring 5 cm maximal transverse diameter as before. Aorta tapers to   2.7 cm at the bifurcation.  No intraluminal thrombus.  No evidence of active extravasation.  Celiac axis:            Mild ostial plaque, unremarkable distal branching  Superior mesenteric:Scattered plaque, no high-grade stenosis  Left renal:             Single, with scattered plaque, no high- grade stenosis.  Right renal:            Single, with scattered plaque, no high- grade proximal stenosis.  Inferior mesenteric:Patent, arising from the aneurysmal segment of the aorta.  Left iliac:             Scattered atheromatous plaque throughout without aneurysm, dissection or stenosis.  The stent in the proximal SFA is partially visualized.  The  Right iliac:            Scattered calcified plaque without high- grade stenosis, aneurysm or dissection.  Venous findings:  Patent hepatic veins, portal vein, superior mesenteric vein, splenic vein, bilateral renal veins, IVC, and iliac venous system.   Review of the MIP images confirms the above findings.  Nonvascular findings: 9.5 x 11 x 15.9 cm left retroperitoneal hematoma without convincing  interval  increase in size since the previous study.  No evidence of active extravasation.  Small bilateral pleural effusions persist, with dependent atelectasis/consolidation posteriorly in both lower lobes, left greater than right.  There is four-chamber cardiac enlargement. Previous median sternotomy.  Gallbladder physiologically distended with multiple small layering partially calcified calculi in its dependent aspect.  Unremarkable liver.  Scattered splenic granulomas.  Small bilateral renal cysts.  Mild pancreatic parenchymal atrophy.  There are streaky inflammatory/edematous changes in the retroperitoneum anterior to the aorta and extending across the midline anterior to the   right psoas musculature in the pelvis, contiguous with the left retroperitoneal hematoma. Stomach, small bowel, and colon are non distended.  The   urinary bladder is incompletely distended.  There is a trace amount of pelvic ascites.  No free air.  No adenopathy localized.  Multilevel spondylitic changes and scoliosis in the lumbar spine.  IMPRESSION:  1.  Stable   large left retroperitoneal hematoma.  No evidence of active extravasation or other acute arterial abnormality. 2.  Stable 5 cm fusiform abdominal aortic aneurysm. 3.  Cholelithiasis.   Original Report Authenticated By: D. Andria Rhein, MD    Ct Angio Abd/pel W/ And/or W/o  07/21/2012  *RADIOLOGY REPORT*  Clinical Data:  79 year old male with chest, abdominal and pelvic pain.  History of mitral valve replacement and GI bleed.  The patient is not currently on anticoagulation.  CT ANGIOGRAPHY CHEST, ABDOMEN AND PELVIS  Technique:  Multidetector CT imaging through the chest, abdomen and pelvis was performed using the standard protocol during bolus administration of intravenous contrast.  Multiplanar reconstructed images including MIPs were obtained and reviewed to evaluate the vascular anatomy.  Contrast: OMNIPAQUE IOHEXOL 350 MG/ML SOLN  Comparison:  07/09/2011  CTA  CHEST  Findings:  Cardiomegaly and mitral valve replacement noted. Moderate coronary artery calcifications are present. And ascending thoracic aortic aneurysm is noted and unchanged measuring 5 cm in greatest diameter.  There is no evidence of dissection.  A small left pleural effusion is noted. There is no evidence of right pleural effusion or pericardial effusion. No enlarged lymph nodes are identified.  Bibasilar atelectasis is noted, left greater than right. There is no evidence of airspace disease, consolidation, mass or endobronchial/endotracheal lesions.   Review of the MIP images confirms the above findings.  IMPRESSION: Unchanged cardiomegaly and ascending thoracic aortic aneurysm measuring 5 cm.  No evidence of dissection or rupture.  Small left pleural effusion with mild bibasilar atelectasis, left greater than right.  CTA ABDOMEN AND PELVIS  Findings:  A large left retroperitoneal hematoma involving the iliopsoas muscle is noted measuring 10 x 10 x 20 cm.  This hematoma lies extremely close to the unchanged 5 cm infrarenal suprailiac abdominal aortic aneurysm, but no definite connection, contrast extravasation, or irregularity of the aortic aneurysm wall is identified.  There is diffuse enlargement of the left iliopsoas musculature rather than displacement. This hematoma is favored to represent a spontaneous retroperitoneal hematoma, but given history of no anticoagulation, an abdominal aortic rupture without evidence of active arterial extravasation is not entirely excluded.  Cirrhosis is identified. Gallbladder sludge versus stones noted. A 2.2 x 2.5 cm anterior slightly hyperdense splenic lesion is nonspecific but statistically benign. Renal atrophy and bilateral renal cysts are present. The pancreas is unremarkable.  There is no evidence of biliary dilatation, enlarged lymph nodes, or free fluid. The bladder and bowel are within normal limits. No acute or suspicious bony abnormalities are identified.  Degenerative changes throughout the spine are  present.   Review of the MIP images confirms the above findings.  IMPRESSION: 10 x 10 x 20 cm left retroperitoneal hematoma adjacent to the a 5 cm infrarenal suprailiac abdominal aortic aneurysm.  Given the characteristics discussed above, a spontaneous retroperitoneal hematoma is favored.  A small focal abdominal aortic aneurysm rupture without contrast extravasation, wall irregularity or identifiable connection is a possibility given the fact that this patient is not on anticoagulation.  Cirrhosis.  Gallbladder sludge versus cholelithiasis.  These results were called to Dr. Estell Harpin on 07/21/2012 at 9:15 a.m.   Original Report Authenticated By: Harmon Pier, M.D.     Microbiology: No results found for this or any previous visit (from the past 240 hour(s)).   Labs: Basic Metabolic Panel:  Recent Labs Lab 08/16/12 0549 08/17/12 0441 08/18/12 0338 08/19/12 0446 08/20/12 0450  NA 139 142 143 144 141  K 4.0 3.6 3.9 3.4* 3.7  CL 97 99 102 103 103  CO2 32 33* 34* 37* 33*  GLUCOSE 152* 107* 123* 78 98  BUN 40* 36* 39* 36* 32*  CREATININE 1.24 1.11 1.07 0.99 0.77  CALCIUM 9.2 9.1 8.8 8.7 8.3*   Liver Function Tests: No results found for this basename: AST, ALT, ALKPHOS, BILITOT, PROT, ALBUMIN,  in the last 168 hours No results found for this basename: LIPASE, AMYLASE,  in the last 168 hours No results found for this basename: AMMONIA,  in the last 168 hours CBC:  Recent Labs Lab 08/15/12 0501 08/17/12 0441 08/18/12 0338 08/19/12 0446 08/20/12 0450  WBC 9.5 11.9* 13.0* 14.0* 14.4*  HGB 11.0* 12.1* 12.3* 13.3 13.1  HCT 34.2* 38.3* 40.1 43.5 42.2  MCV 100.9* 101.6* 102.8* 103.8* 102.7*  PLT 201 252 239 223 182   Cardiac Enzymes: No results found for this basename: CKTOTAL, CKMB, CKMBINDEX, TROPONINI,  in the last 168 hours BNP: BNP (last 3 results)  Recent Labs  08/03/12 0536 08/12/12 1755 08/17/12 0441  PROBNP 2605.0* 5479.0*  6458.0*   CBG:  Recent Labs Lab 08/19/12 1114 08/19/12 1611 08/19/12 2057 08/20/12 0721 08/20/12 1139  GLUCAP 98 161* 134* 80 96       Signed:  Marcene Laskowski  [facility] 08/20/2012, 1:42 PM  Attending note:    Patient seen and examined on the day of discharge.  He appears to be approaching his baseline.  He is ready for discharge to SNF.

## 2012-08-20 NOTE — Progress Notes (Signed)
UR Chart Review Completed  

## 2012-08-20 NOTE — Progress Notes (Signed)
[facility] report [facility]. Patient was escorted by staff via wheelchair to [facility]. Patient discharged in stable condition.

## 2012-08-20 NOTE — Clinical Social Work Note (Signed)
Pt d/c today to [facility]. Pt's daughter and facility aware and agreeable. Pt to transfer with RN. D/C summary faxed.  [name], [place] [phone]

## 2012-08-20 NOTE — Clinical Social Work Placement (Signed)
Clinical Social Work Department CLINICAL SOCIAL WORK PLACEMENT NOTE 08/20/2012  Patient:  [name], [name]  Account Number:  [id number] Admit date:  08/12/2012  Clinical Social Worker:  Santa Genera, CLINICAL SOCIAL WORKER  Date/time:  08/14/2012 04:00 PM  Clinical Social Work is seeking post-discharge placement for this patient at the following level of care:   SKILLED NURSING   (*CSW will update this form in Epic as items are completed)   08/14/2012  Patient/family provided with [facility] Department of Clinical Social Work's list of facilities offering this level of care within the geographic area requested by the patient (or if unable, by the patient's family).  08/14/2012  Patient/family informed of their freedom to choose among providers that offer the needed level of care, that participate in Medicare, Medicaid or managed care program needed by the patient, have an available bed and are willing to accept the patient.  08/14/2012  Patient/family informed of [facility]' ownership interest in [facility], as well as of the fact that they are under no obligation to receive care at this facility.  PASARR submitted to EDS on 08/14/2012 PASARR number received from EDS on 08/14/2012  FL2 transmitted to all facilities in geographic area requested by pt/family on  08/14/2012 FL2 transmitted to all facilities within larger geographic area on   Patient informed that his/her managed care company has contracts with or will negotiate with  certain facilities, including the following:     Patient/family informed of bed offers received:  08/17/2012 Patient chooses bed at [facility] Physician recommends and patient chooses bed at  [facility]  Patient to be transferred to [facility] on  08/20/2012 Patient to be transferred to facility by RN  The following physician request were entered in Epic:   Additional Comments:  [name],  LCSW [phone]

## 2012-08-21 LAB — GLUCOSE, CAPILLARY: Glucose-Capillary: 119 mg/dL — ABNORMAL HIGH (ref 70–99)

## 2012-08-22 LAB — GLUCOSE, CAPILLARY
Glucose-Capillary: 164 mg/dL — ABNORMAL HIGH (ref 70–99)
Glucose-Capillary: 166 mg/dL — ABNORMAL HIGH (ref 70–99)

## 2012-08-23 LAB — GLUCOSE, CAPILLARY
Glucose-Capillary: 83 mg/dL (ref 70–99)
Glucose-Capillary: 99 mg/dL (ref 70–99)

## 2012-08-24 ENCOUNTER — Ambulatory Visit (HOSPITAL_COMMUNITY)
Admit: 2012-08-24 | Discharge: 2012-08-24 | Disposition: A | Discharge status: Home / Self Care | Payer: Medicare Other | Attending: Internal Medicine | Admitting: Internal Medicine

## 2012-08-24 DIAGNOSIS — M25559 Pain in unspecified hip: Secondary | ICD-10-CM | POA: Insufficient documentation

## 2012-08-24 DIAGNOSIS — M949 Disorder of cartilage, unspecified: Secondary | ICD-10-CM | POA: Insufficient documentation

## 2012-08-24 DIAGNOSIS — M899 Disorder of bone, unspecified: Secondary | ICD-10-CM | POA: Insufficient documentation

## 2012-08-24 LAB — GLUCOSE, CAPILLARY: Glucose-Capillary: 91 mg/dL (ref 70–99)

## 2012-08-25 ENCOUNTER — Telehealth: Payer: Self-pay | Admitting: Family Medicine

## 2012-08-25 LAB — GLUCOSE, CAPILLARY
Glucose-Capillary: 140 mg/dL — ABNORMAL HIGH (ref 70–99)
Glucose-Capillary: 148 mg/dL — ABNORMAL HIGH (ref 70–99)
Glucose-Capillary: 128 mg/dL — ABNORMAL HIGH (ref 70–99)
Glucose-Capillary: 171 mg/dL — ABNORMAL HIGH (ref 70–99)

## 2012-08-25 NOTE — Telephone Encounter (Signed)
Spoke with valerie at [facility] and patient should receive referral from in house physician.  He is already established at [facility] with Dr. Dietrich Pates.

## 2012-08-25 NOTE — Telephone Encounter (Signed)
Called and left message for nurse at [facility] to return call.

## 2012-08-26 LAB — GLUCOSE, CAPILLARY
Glucose-Capillary: 171 mg/dL — ABNORMAL HIGH (ref 70–99)
Glucose-Capillary: 150 mg/dL — ABNORMAL HIGH (ref 70–99)

## 2012-08-27 ENCOUNTER — Inpatient Hospital Stay (HOSPITAL_COMMUNITY)
Admission: EM | Admit: 2012-08-27 | Discharge: 2012-08-31 | DRG: 315 | Disposition: A | Discharge status: Hospice | Payer: Medicare Other | Attending: Internal Medicine | Admitting: Internal Medicine

## 2012-08-27 ENCOUNTER — Emergency Department (HOSPITAL_COMMUNITY): Payer: Medicare Other

## 2012-08-27 ENCOUNTER — Other Ambulatory Visit: Payer: Self-pay

## 2012-08-27 ENCOUNTER — Encounter (HOSPITAL_COMMUNITY): Payer: Self-pay | Admitting: Emergency Medicine

## 2012-08-27 DIAGNOSIS — G9340 Encephalopathy, unspecified: Secondary | ICD-10-CM

## 2012-08-27 DIAGNOSIS — J441 Chronic obstructive pulmonary disease with (acute) exacerbation: Secondary | ICD-10-CM

## 2012-08-27 DIAGNOSIS — Z951 Presence of aortocoronary bypass graft: Secondary | ICD-10-CM

## 2012-08-27 DIAGNOSIS — Z0000 Encounter for general adult medical examination without abnormal findings: Secondary | ICD-10-CM

## 2012-08-27 DIAGNOSIS — I482 Chronic atrial fibrillation, unspecified: Secondary | ICD-10-CM | POA: Diagnosis present

## 2012-08-27 DIAGNOSIS — Z66 Do not resuscitate: Secondary | ICD-10-CM | POA: Diagnosis present

## 2012-08-27 DIAGNOSIS — R6521 Severe sepsis with septic shock: Secondary | ICD-10-CM

## 2012-08-27 DIAGNOSIS — I719 Aortic aneurysm of unspecified site, without rupture: Secondary | ICD-10-CM

## 2012-08-27 DIAGNOSIS — I714 Abdominal aortic aneurysm, without rupture, unspecified: Secondary | ICD-10-CM | POA: Diagnosis present

## 2012-08-27 DIAGNOSIS — I959 Hypotension, unspecified: Principal | ICD-10-CM | POA: Diagnosis present

## 2012-08-27 DIAGNOSIS — E669 Obesity, unspecified: Secondary | ICD-10-CM

## 2012-08-27 DIAGNOSIS — Z8249 Family history of ischemic heart disease and other diseases of the circulatory system: Secondary | ICD-10-CM

## 2012-08-27 DIAGNOSIS — R6520 Severe sepsis without septic shock: Secondary | ICD-10-CM

## 2012-08-27 DIAGNOSIS — Z87891 Personal history of nicotine dependence: Secondary | ICD-10-CM

## 2012-08-27 DIAGNOSIS — Z9981 Dependence on supplemental oxygen: Secondary | ICD-10-CM

## 2012-08-27 DIAGNOSIS — I2723 Pulmonary hypertension due to lung diseases and hypoxia: Secondary | ICD-10-CM

## 2012-08-27 DIAGNOSIS — D509 Iron deficiency anemia, unspecified: Secondary | ICD-10-CM

## 2012-08-27 DIAGNOSIS — I059 Rheumatic mitral valve disease, unspecified: Secondary | ICD-10-CM | POA: Diagnosis present

## 2012-08-27 DIAGNOSIS — E876 Hypokalemia: Secondary | ICD-10-CM | POA: Diagnosis present

## 2012-08-27 DIAGNOSIS — Z809 Family history of malignant neoplasm, unspecified: Secondary | ICD-10-CM

## 2012-08-27 DIAGNOSIS — A419 Sepsis, unspecified organism: Secondary | ICD-10-CM | POA: Diagnosis present

## 2012-08-27 DIAGNOSIS — I5033 Acute on chronic diastolic (congestive) heart failure: Secondary | ICD-10-CM

## 2012-08-27 DIAGNOSIS — D7589 Other specified diseases of blood and blood-forming organs: Secondary | ICD-10-CM

## 2012-08-27 DIAGNOSIS — I5040 Unspecified combined systolic (congestive) and diastolic (congestive) heart failure: Secondary | ICD-10-CM | POA: Diagnosis present

## 2012-08-27 DIAGNOSIS — R739 Hyperglycemia, unspecified: Secondary | ICD-10-CM

## 2012-08-27 DIAGNOSIS — R6510 Systemic inflammatory response syndrome (SIRS) of non-infectious origin without acute organ dysfunction: Secondary | ICD-10-CM | POA: Diagnosis present

## 2012-08-27 DIAGNOSIS — J9602 Acute respiratory failure with hypercapnia: Secondary | ICD-10-CM

## 2012-08-27 DIAGNOSIS — I5023 Acute on chronic systolic (congestive) heart failure: Secondary | ICD-10-CM

## 2012-08-27 DIAGNOSIS — C61 Malignant neoplasm of prostate: Secondary | ICD-10-CM

## 2012-08-27 DIAGNOSIS — I5032 Chronic diastolic (congestive) heart failure: Secondary | ICD-10-CM

## 2012-08-27 DIAGNOSIS — J449 Chronic obstructive pulmonary disease, unspecified: Secondary | ICD-10-CM | POA: Diagnosis present

## 2012-08-27 DIAGNOSIS — I5021 Acute systolic (congestive) heart failure: Secondary | ICD-10-CM

## 2012-08-27 DIAGNOSIS — Z8546 Personal history of malignant neoplasm of prostate: Secondary | ICD-10-CM

## 2012-08-27 DIAGNOSIS — J4489 Other specified chronic obstructive pulmonary disease: Secondary | ICD-10-CM | POA: Diagnosis present

## 2012-08-27 DIAGNOSIS — I4891 Unspecified atrial fibrillation: Secondary | ICD-10-CM | POA: Diagnosis present

## 2012-08-27 DIAGNOSIS — I071 Rheumatic tricuspid insufficiency: Secondary | ICD-10-CM

## 2012-08-27 DIAGNOSIS — R269 Unspecified abnormalities of gait and mobility: Secondary | ICD-10-CM

## 2012-08-27 DIAGNOSIS — J189 Pneumonia, unspecified organism: Secondary | ICD-10-CM | POA: Diagnosis present

## 2012-08-27 DIAGNOSIS — J309 Allergic rhinitis, unspecified: Secondary | ICD-10-CM

## 2012-08-27 DIAGNOSIS — E785 Hyperlipidemia, unspecified: Secondary | ICD-10-CM | POA: Diagnosis present

## 2012-08-27 DIAGNOSIS — E739 Lactose intolerance, unspecified: Secondary | ICD-10-CM

## 2012-08-27 DIAGNOSIS — Z823 Family history of stroke: Secondary | ICD-10-CM

## 2012-08-27 DIAGNOSIS — J9620 Acute and chronic respiratory failure, unspecified whether with hypoxia or hypercapnia: Secondary | ICD-10-CM

## 2012-08-27 DIAGNOSIS — M1990 Unspecified osteoarthritis, unspecified site: Secondary | ICD-10-CM | POA: Diagnosis present

## 2012-08-27 DIAGNOSIS — I2510 Atherosclerotic heart disease of native coronary artery without angina pectoris: Secondary | ICD-10-CM

## 2012-08-27 DIAGNOSIS — J9600 Acute respiratory failure, unspecified whether with hypoxia or hypercapnia: Secondary | ICD-10-CM

## 2012-08-27 DIAGNOSIS — K219 Gastro-esophageal reflux disease without esophagitis: Secondary | ICD-10-CM | POA: Diagnosis present

## 2012-08-27 DIAGNOSIS — I509 Heart failure, unspecified: Secondary | ICD-10-CM | POA: Diagnosis present

## 2012-08-27 DIAGNOSIS — IMO0002 Reserved for concepts with insufficient information to code with codable children: Secondary | ICD-10-CM

## 2012-08-27 DIAGNOSIS — I10 Essential (primary) hypertension: Secondary | ICD-10-CM | POA: Diagnosis present

## 2012-08-27 DIAGNOSIS — I350 Nonrheumatic aortic (valve) stenosis: Secondary | ICD-10-CM

## 2012-08-27 DIAGNOSIS — R109 Unspecified abdominal pain: Secondary | ICD-10-CM

## 2012-08-27 DIAGNOSIS — E119 Type 2 diabetes mellitus without complications: Secondary | ICD-10-CM | POA: Diagnosis present

## 2012-08-27 DIAGNOSIS — Z79899 Other long term (current) drug therapy: Secondary | ICD-10-CM

## 2012-08-27 DIAGNOSIS — T50905A Adverse effect of unspecified drugs, medicaments and biological substances, initial encounter: Secondary | ICD-10-CM

## 2012-08-27 DIAGNOSIS — I359 Nonrheumatic aortic valve disorder, unspecified: Secondary | ICD-10-CM | POA: Diagnosis present

## 2012-08-27 DIAGNOSIS — I5042 Chronic combined systolic (congestive) and diastolic (congestive) heart failure: Secondary | ICD-10-CM | POA: Diagnosis present

## 2012-08-27 DIAGNOSIS — Z6826 Body mass index (BMI) 26.0-26.9, adult: Secondary | ICD-10-CM

## 2012-08-27 DIAGNOSIS — I252 Old myocardial infarction: Secondary | ICD-10-CM

## 2012-08-27 DIAGNOSIS — I341 Nonrheumatic mitral (valve) prolapse: Secondary | ICD-10-CM

## 2012-08-27 DIAGNOSIS — I739 Peripheral vascular disease, unspecified: Secondary | ICD-10-CM

## 2012-08-27 DIAGNOSIS — J9610 Chronic respiratory failure, unspecified whether with hypoxia or hypercapnia: Secondary | ICD-10-CM | POA: Diagnosis present

## 2012-08-27 LAB — URINALYSIS, ROUTINE W REFLEX MICROSCOPIC
Specific Gravity, Urine: 1.01 (ref 1.005–1.030)
Urobilinogen, UA: 1 mg/dL (ref 0.0–1.0)

## 2012-08-27 LAB — TROPONIN I: Troponin I: <0.3 ng/mL (ref <0.30)

## 2012-08-27 LAB — BASIC METABOLIC PANEL
CO2: 38 mEq/L — ABNORMAL HIGH (ref 19–32)
GFR calc non Af Amer: 59 mL/min — ABNORMAL LOW (ref >90)
Glucose, Bld: 121 mg/dL — ABNORMAL HIGH (ref 70–99)
Potassium: 4.9 mEq/L (ref 3.5–5.1)
Sodium: 139 mEq/L (ref 135–145)

## 2012-08-27 LAB — CBC WITH DIFFERENTIAL/PLATELET
Basophils Absolute: 0 10*3/uL (ref 0.0–0.1)
Basophils Relative: 0 % (ref 0–1)
HCT: 41.4 % (ref 39.0–52.0)
Hemoglobin: 13.2 g/dL (ref 13.0–17.0)
Lymphocytes Relative: 2 % — ABNORMAL LOW (ref 12–46)
Monocytes Relative: 7 % (ref 3–12)
Neutro Abs: 25.1 10*3/uL — ABNORMAL HIGH (ref 1.7–7.7)
WBC: 27.6 10*3/uL — ABNORMAL HIGH (ref 4.0–10.5)

## 2012-08-27 LAB — DIGOXIN LEVEL: Digoxin Level: 1.3 ng/mL (ref 0.8–2.0)

## 2012-08-27 LAB — GLUCOSE, CAPILLARY: Glucose-Capillary: 78 mg/dL (ref 70–99)

## 2012-08-27 LAB — PROCALCITONIN: Procalcitonin: 0.93 ng/mL

## 2012-08-27 LAB — TYPE AND SCREEN
ABO/RH(D): O POS
DAT, IgG: NEGATIVE

## 2012-08-27 LAB — PROTIME-INR
INR: 1.15 (ref 0.00–1.49)
Prothrombin Time: 14.5 seconds (ref 11.6–15.2)

## 2012-08-27 LAB — URINE MICROSCOPIC-ADD ON

## 2012-08-27 LAB — APTT: aPTT: 31 seconds (ref 24–37)

## 2012-08-27 LAB — MRSA PCR SCREENING: MRSA by PCR: NEGATIVE

## 2012-08-27 MED ORDER — VANCOMYCIN HCL IN DEXTROSE 1-5 GM/200ML-% IV SOLN
INTRAVENOUS | Status: AC
  Filled 2012-08-27: qty 200

## 2012-08-27 MED ORDER — ENOXAPARIN SODIUM 40 MG/0.4ML ~~LOC~~ SOLN
40.0000 mg | SUBCUTANEOUS | Status: DC
  Administered 2012-08-27 – 2012-08-30 (×4): 40 mg via SUBCUTANEOUS
  Filled 2012-08-27 (×4): qty 0.4

## 2012-08-27 MED ORDER — ACETAMINOPHEN 325 MG PO TABS: 650.0000 mg | ORAL_TABLET | ORAL | Status: DC | PRN

## 2012-08-27 MED ORDER — MOMETASONE FURO-FORMOTEROL FUM 100-5 MCG/ACT IN AERO
2.0000 | INHALATION_SPRAY | Freq: Two times a day (BID) | RESPIRATORY_TRACT | Status: DC
  Administered 2012-08-28 – 2012-08-31 (×7): 2 via RESPIRATORY_TRACT
  Filled 2012-08-27: qty 8.8

## 2012-08-27 MED ORDER — SODIUM CHLORIDE 0.9 % IV BOLUS (SEPSIS)
500.0000 mL | Freq: Once | INTRAVENOUS | Status: AC
  Administered 2012-08-27: 500 mL via INTRAVENOUS

## 2012-08-27 MED ORDER — AZITHROMYCIN 500 MG IV SOLR
500.0000 mg | INTRAVENOUS | Status: DC
  Administered 2012-08-27 – 2012-08-28 (×2): 500 mg via INTRAVENOUS
  Filled 2012-08-27 (×4): qty 500

## 2012-08-27 MED ORDER — VANCOMYCIN HCL IN DEXTROSE 1-5 GM/200ML-% IV SOLN
1000.0000 mg | Freq: Once | INTRAVENOUS | Status: AC
  Administered 2012-08-27: 1000 mg via INTRAVENOUS
  Filled 2012-08-27: qty 200

## 2012-08-27 MED ORDER — SENNOSIDES-DOCUSATE SODIUM 8.6-50 MG PO TABS: 2.0000 | ORAL_TABLET | Freq: Every evening | ORAL | Status: DC | PRN

## 2012-08-27 MED ORDER — ALBUTEROL SULFATE (5 MG/ML) 0.5% IN NEBU
2.5000 mg | INHALATION_SOLUTION | Freq: Four times a day (QID) | RESPIRATORY_TRACT | Status: DC
  Administered 2012-08-27 – 2012-08-31 (×16): 2.5 mg via RESPIRATORY_TRACT
  Filled 2012-08-27 (×16): qty 0.5

## 2012-08-27 MED ORDER — DEXTROSE 5 % IV SOLN
INTRAVENOUS | Status: AC
  Filled 2012-08-27: qty 500

## 2012-08-27 MED ORDER — PIPERACILLIN-TAZOBACTAM 3.375 G IVPB
3.3750 g | Freq: Once | INTRAVENOUS | Status: AC
  Administered 2012-08-27: 3.375 g via INTRAVENOUS
  Filled 2012-08-27: qty 50

## 2012-08-27 MED ORDER — SODIUM CHLORIDE 0.9 % IV SOLN
500.0000 mg | Freq: Three times a day (TID) | INTRAVENOUS | Status: DC
  Administered 2012-08-27 – 2012-08-29 (×6): 500 mg via INTRAVENOUS
  Filled 2012-08-27 (×12): qty 500

## 2012-08-27 MED ORDER — SODIUM CHLORIDE 0.9 % IV SOLN
INTRAVENOUS | Status: DC
  Administered 2012-08-27: 20:00:00 via INTRAVENOUS

## 2012-08-27 MED ORDER — DIGOXIN 125 MCG PO TABS
0.1250 mg | ORAL_TABLET | Freq: Every day | ORAL | Status: DC
  Administered 2012-08-28 – 2012-08-31 (×4): 0.125 mg via ORAL
  Filled 2012-08-27 (×4): qty 1

## 2012-08-27 MED ORDER — PANTOPRAZOLE SODIUM 40 MG PO TBEC
80.0000 mg | DELAYED_RELEASE_TABLET | Freq: Every day | ORAL | Status: DC
  Administered 2012-08-28 – 2012-08-31 (×4): 80 mg via ORAL
  Filled 2012-08-27 (×4): qty 2

## 2012-08-27 MED ORDER — TAMSULOSIN HCL 0.4 MG PO CAPS
0.4000 mg | ORAL_CAPSULE | Freq: Every morning | ORAL | Status: DC
  Administered 2012-08-28 – 2012-08-31 (×4): 0.4 mg via ORAL
  Filled 2012-08-27 (×4): qty 1

## 2012-08-27 MED ORDER — ATORVASTATIN CALCIUM 10 MG PO TABS
10.0000 mg | ORAL_TABLET | Freq: Every day | ORAL | Status: DC
  Administered 2012-08-27 – 2012-08-30 (×4): 10 mg via ORAL
  Filled 2012-08-27 (×4): qty 1

## 2012-08-27 MED ORDER — SODIUM CHLORIDE 0.9 % IV SOLN
INTRAVENOUS | Status: AC
  Filled 2012-08-27 (×2): qty 500

## 2012-08-27 MED ORDER — BENZONATATE 100 MG PO CAPS: 100.0000 mg | ORAL_CAPSULE | Freq: Four times a day (QID) | ORAL | Status: DC | PRN

## 2012-08-27 MED ORDER — IPRATROPIUM BROMIDE 0.02 % IN SOLN
500.0000 ug | Freq: Four times a day (QID) | RESPIRATORY_TRACT | Status: DC
  Administered 2012-08-27 – 2012-08-31 (×16): 500 ug via RESPIRATORY_TRACT
  Filled 2012-08-27 (×16): qty 2.5

## 2012-08-27 MED ORDER — HYDROCORTISONE SOD SUCCINATE 100 MG IJ SOLR
100.0000 mg | Freq: Two times a day (BID) | INTRAMUSCULAR | Status: DC
  Administered 2012-08-28 – 2012-08-29 (×3): 100 mg via INTRAVENOUS
  Filled 2012-08-27 (×3): qty 2

## 2012-08-27 MED ORDER — SODIUM CHLORIDE 0.9 % IJ SOLN
3.0000 mL | Freq: Two times a day (BID) | INTRAMUSCULAR | Status: DC
  Administered 2012-08-27 – 2012-08-30 (×6): 3 mL via INTRAVENOUS

## 2012-08-27 MED ORDER — HYDROCORTISONE SOD SUCCINATE 100 MG IJ SOLR
100.0000 mg | Freq: Once | INTRAMUSCULAR | Status: AC
  Administered 2012-08-27: 100 mg via INTRAVENOUS
  Filled 2012-08-27: qty 2

## 2012-08-27 MED ORDER — VANCOMYCIN HCL IN DEXTROSE 1-5 GM/200ML-% IV SOLN
1000.0000 mg | Freq: Two times a day (BID) | INTRAVENOUS | Status: DC
  Administered 2012-08-28 – 2012-08-31 (×7): 1000 mg via INTRAVENOUS
  Filled 2012-08-27 (×9): qty 200

## 2012-08-27 NOTE — ED Notes (Addendum)
Per [facility] pt is more lethargic than usual and has abnormal v/s. Last v/s at [facility] 72/42, o2 sat 88% 4L, Resp 34,HR 88. Pt alert and oriented at this time. Pt denies any pain. Per [facility] RN pt was admitted and released several weeks ago for CHF,COPD, exacerbation.

## 2012-08-27 NOTE — Progress Notes (Signed)
ANTIBIOTIC CONSULT NOTE - INITIAL  Pharmacy Consult for Vancomycin Indication: rule out sepsis  Allergies  Allergen Reactions  . Aspirin   . Gabapentin     Patient cannot take over 200 mg dose at a time as it causes jerking or spasms  . Naproxen     Patient Measurements: Height: 6\' 3"  (190.5 cm) Weight: 230 lb (104.327 kg) (per [facility] pt weight as of March 3rd.) IBW/kg (Calculated) : 84.5  Vital Signs: Temp: 100.4 F (38 C) (03/06 1457) Temp src: Oral (03/06 1329) BP: 99/57 mmHg (03/06 1737) Pulse Rate: 78 (03/06 1737) Intake/Output from previous day:   Intake/Output from this shift:    Labs:  Recent Labs  08/27/12 1348  WBC 27.6*  HGB 13.2  PLT 135*  CREATININE 1.08   Estimated Creatinine Clearance: 61.8 ml/min (by C-G formula based on Cr of 1.08). No results found for this basename: VANCOTROUGH, VANCOPEAK, VANCORANDOM, GENTTROUGH, GENTPEAK, GENTRANDOM, TOBRATROUGH, TOBRAPEAK, TOBRARND, AMIKACINPEAK, AMIKACINTROU, AMIKACIN,  in the last 72 hours   Microbiology: No results found for this or any previous visit (from the past 720 hour(s)).  Medical History: Past Medical History  Diagnosis Date  . Mitral valve prolapse 2003    MVR in 2003  . Obesity   . Osteoarthritis   . Hypertension   . Hyperlipidemia   . COPD (chronic obstructive pulmonary disease)   . CHF (congestive heart failure) diastolic 10/2010    CXR in 12/2010: Prior CABG, cardiomegaly, vascular redistribution, bibasilar atelectasis, small effusions  . Pneumonia   . Impaired glucose tolerance   . Atrial fibrillation, chronic   . Prostate cancer     elevated PSA  . GERD (gastroesophageal reflux disease)     + hiatal hernia  . Arteriosclerotic cardiovascular disease (ASCVD) 1994    stent to RCA; CABG-2003  . Hard of hearing   . Anemia, iron deficiency   . AAA (abdominal aortic aneurysm)     Fusiform; infrarenal; 4-4.1cm on CT in 07/2008 and 12/2010 by MRI  . Gastric ulcer 2004    2004;  upper GI bleed  . Renal cysts, acquired, bilateral     Complex by MRI in 12/2010  . Adenomatous polyps 04/07/2012  . HYPERTENSION 12/17/2007    Subsequently hypotensive and all antihypertensive medication discontinued Lab  03/2012: Mild anemia with H&H-11.8/37.5, MCV-97, ferritin-224, normal CMet ex G-124, alb-3.4   . NSTEMI (non-ST elevated myocardial infarction) 01/18/2011  . Diabetes mellitus, type II 07/13/12    family denies patient is diabetic  . Macrocytosis without anemia 08/13/2012  . Acute systolic congestive heart failure 08/15/2012    EF 35-40%, per Echo  . Aortic stenosis, moderate 08/15/2012  . Pulmonary hypertension due to COPD 08/15/2012    53 mm Hg  . Tricuspid valve regurgitation 08/15/2012  . Retroperitoneal bleed 06/2012    Medications:  Scheduled:  . albuterol  2.5 mg Nebulization QID  . atorvastatin  10 mg Oral q1800  . azithromycin  500 mg Intravenous Q24H  . [START ON 08/28/2012] digoxin  0.125 mg Oral Daily  . enoxaparin (LOVENOX) injection  40 mg Subcutaneous Q24H  . [COMPLETED] hydrocortisone sod succinate (SOLU-CORTEF) injection  100 mg Intravenous Once  . [START ON 08/28/2012] hydrocortisone sod succinate (SOLU-CORTEF) injection  100 mg Intravenous Q12H  . imipenem-cilastatin  500 mg Intravenous Q8H  . ipratropium  500 mcg Nebulization QID  . mometasone-formoterol  2 puff Inhalation BID  . [START ON 08/28/2012] pantoprazole  80 mg Oral Daily  . [COMPLETED] piperacillin-tazobactam (  ZOSYN)  IV  3.375 g Intravenous Once  . [COMPLETED] sodium chloride  500 mL Intravenous Once  . sodium chloride  3 mL Intravenous Q12H  . [START ON 08/28/2012] tamsulosin  0.4 mg Oral q morning - 10a  . [COMPLETED] vancomycin  1,000 mg Intravenous Once  . [START ON 08/28/2012] vancomycin  1,000 mg Intravenous Q12H   Assessment: Vancomycin 1 GM IV given in ED  Goal of Therapy:  Vancomycin trough level 15-20 mcg/ml  Plan:  Vancomycin 1 GM IV every 12 hours Vancomycin trough at steady  state Labs per protocol  [name], [name] 08/27/2012,7:33 PM

## 2012-08-27 NOTE — ED Provider Notes (Signed)
History  This chart was scribed for Charles B. Bernette Mayers, MD by Shari Heritage, ED Scribe. The patient was seen in room APA01/APA01. Patient's care was started at 1344.   CSN: [id number]  Arrival date & time 08/27/12  1310   First MD Initiated Contact with Patient 08/27/12 1344      Chief Complaint  Patient presents with  . Hypotension    The history is provided by the patient. No language interpreter was used.    HPI Comments: [name] is a 79 y.o. male who presents to the Emergency Department from the [facility] with hypotension. Per [facility] staff, patient has been more lethargic than baseline. Patient is also complaining of constant right lateral knee pain, constant right hip pain and generalized weakness. His last BP reading at [facility] prior to arrival was 72/42 with O2 sat of 88% on L and HR 88. He denies shortness of breath, abdominal pain, chest pain, back pain, vomiting or fever. Per medical records, he was admitted to the hospital on 08/12/2012 with respiratory failure and COPD exacerbation; he was discharged on 08/20/2012. Patient has an extensive cardiac history which includes: MVR, congestive heart failure, ASCVD, atrial fibrillation, NSTEMI, aortic stenosis and CABG. Other medical history includes hypertension, hyperlipidemia, prostate cancer and diabetes.  Past Medical History  Diagnosis Date  . Mitral valve prolapse 2003    MVR in 2003  . Obesity   . Osteoarthritis   . Hypertension   . Hyperlipidemia   . COPD (chronic obstructive pulmonary disease)   . CHF (congestive heart failure) diastolic 10/2010    CXR in 12/2010: Prior CABG, cardiomegaly, vascular redistribution, bibasilar atelectasis, small effusions  . Pneumonia   . Impaired glucose tolerance   . Atrial fibrillation, chronic   . Prostate cancer     elevated PSA  . GERD (gastroesophageal reflux disease)     + hiatal hernia  . Arteriosclerotic cardiovascular disease (ASCVD) 1994    stent to RCA;  CABG-2003  . Hard of hearing   . Anemia, iron deficiency   . AAA (abdominal aortic aneurysm)     Fusiform; infrarenal; 4-4.1cm on CT in 07/2008 and 12/2010 by MRI  . Gastric ulcer 2004    2004; upper GI bleed  . Renal cysts, acquired, bilateral     Complex by MRI in 12/2010  . Adenomatous polyps 04/07/2012  . HYPERTENSION 12/17/2007    Subsequently hypotensive and all antihypertensive medication discontinued Lab  03/2012: Mild anemia with H&H-11.8/37.5, MCV-97, ferritin-224, normal CMet ex G-124, alb-3.4   . NSTEMI (non-ST elevated myocardial infarction) 01/18/2011  . Diabetes mellitus, type II 07/13/12    family denies patient is diabetic  . Macrocytosis without anemia 08/13/2012  . Acute systolic congestive heart failure 08/15/2012    EF 35-40%, per Echo  . Aortic stenosis, moderate 08/15/2012  . Pulmonary hypertension due to COPD 08/15/2012    53 mm Hg  . Tricuspid valve regurgitation 08/15/2012  . Retroperitoneal bleed 06/2012    Past Surgical History  Procedure Laterality Date  . Knee surgery      Left  . Bladder surgery    . Femoral artery stent  12-06-10    Left SFA  . Appendectomy    . Mitral valve replacement (mvr)/coronary artery bypass grafting (cabg)  2003    stent to RCA in 1994  . Eye surgery  2007    bilateral cataracts  . Tonsillectomy      thinks they were removed while in the [employer]  .  Mass excision  06/07/2011    Procedure: EXCISION MASS;  Surgeon: Fabio Bering;  Location: [facility];  Service: General;  Laterality: N/A;  excision of 2 masses back and buttocks  . Back surgery    . Esophagogastroduodenoscopy  03/03/2003    Large, deep prepyloric ulcer, as described above without bleeding/ Normal esophagus  . Esophagogastroduodenoscopy  09/03/2004    normal throughout  . Colonoscopy  09/03/2004    small ulcer, without stigmata of bleeding  . Esophagogastroduodenoscopy  11/14/2004    Normal esophagus and small hiatal hernia, otherwise normal stomach   . Colonoscopy   11/14/2004    Internal hemorrhoids, otherwise normal rectum/ left-sided diverticula , diffusely oozing right colon mucosa without a discrete lesion amenable to endoscopic therapy. 2 diminutive polyps. FELT TO HAVE AVMs/telangiectasias  . Colonoscopy  01/29/2005    Normal rectum  . Colonoscopy  03/11/2012    Colonic diverticulosis. Colonic polyps-removed as described above. Vascular anomalies in the cecum likely representing hemangiomas. Status post hemostasis clipping of  2 of the 3. ADENOMATOUS POLYPS. Repeat 2016  . Esophagogastroduodenoscopy  03/11/2012    Deformity of the antrum;  small polyp in antrum-not manipulated. Otherwise normal exam    Family History  Problem Relation Age of Onset  . Stroke Mother   . Stroke Brother   . Heart attack Brother   . Cancer Brother   . Cancer Brother   . Anesthesia problems Neg Hx   . Hypotension Neg Hx   . Malignant hyperthermia Neg Hx   . Pseudochol deficiency Neg Hx     History  Substance Use Topics  . Smoking status: Former Smoker -- 1.00 packs/day for 70 years    Types: Cigarettes    Quit date: 06/25/2007  . Smokeless tobacco: Former Neurosurgeon  . Alcohol Use: No      Review of Systems A complete 10 system review of systems was obtained and all systems are negative except as noted in the HPI and PMH.   Allergies  Aspirin; Gabapentin; and Naproxen  Home Medications  No current outpatient prescriptions on file.  Triage Vitals: BP 82/42  Pulse 75  Temp(Src) 98.1 F (36.7 C) (Oral)  Resp 26  Ht 6\' 3"  (1.905 m)  Wt 258 lb (117.028 kg)  BMI 32.25 kg/m2  SpO2 94%  Physical Exam  Nursing note and vitals reviewed. Constitutional: He is oriented to person, place, and time. He appears well-developed and well-nourished.  HENT:  Head: Normocephalic and atraumatic.  Eyes: EOM are normal. Pupils are equal, round, and reactive to light.  Neck: Normal range of motion. Neck supple.  Cardiovascular: Normal rate, normal heart sounds and  intact distal pulses.  An irregular rhythm present.  Pulmonary/Chest: Effort normal.  Distant heart sounds.  Abdominal: Bowel sounds are normal. He exhibits no distension. There is no tenderness.  Musculoskeletal: Normal range of motion. He exhibits no edema and no tenderness.  Neurological: He is alert and oriented to person, place, and time. He has normal strength. No cranial nerve deficit or sensory deficit.  Skin: Skin is warm and dry. No rash noted.  Multiple bruises in various stages of healing.  Psychiatric: He has a normal mood and affect.    ED Course  Procedures (including critical care time) DIAGNOSTIC STUDIES: Oxygen Saturation is 94% on room air, adequate by my interpretation.    COORDINATION OF CARE: 1:49 PM- Patient informed of current plan for treatment and evaluation and agrees with plan at this time.  Labs Reviewed  CBC WITH DIFFERENTIAL - Abnormal; Notable for the following:    WBC 27.6 (*)    RBC 4.08 (*)    MCV 101.5 (*)    RDW 17.4 (*)    Platelets 135 (*)    Neutrophils Relative 91 (*)    Lymphocytes Relative 2 (*)    Neutro Abs 25.1 (*)    Lymphs Abs 0.6 (*)    Monocytes Absolute 1.9 (*)    All other components within normal limits  BASIC METABOLIC PANEL - Abnormal; Notable for the following:    Chloride 95 (*)    CO2 38 (*)    Glucose, Bld 121 (*)    BUN 27 (*)    GFR calc non Af Amer 59 (*)    GFR calc Af Amer 69 (*)    All other components within normal limits  PRO B NATRIURETIC PEPTIDE - Abnormal; Notable for the following:    Pro B Natriuretic peptide (BNP) 8686.0 (*)    All other components within normal limits  CULTURE, BLOOD (ROUTINE X 2)  CULTURE, BLOOD (ROUTINE X 2)  TROPONIN I  LACTIC ACID, PLASMA  DIGOXIN LEVEL  PROTIME-INR  APTT  URINALYSIS, ROUTINE W REFLEX MICROSCOPIC  TYPE AND SCREEN    Dg Chest Port 1 View  08/27/2012  *RADIOLOGY REPORT*  Clinical Data: Hypotension.  Hypoxia.  PORTABLE CHEST - 1 VIEW  Comparison:  08/19/2012  Findings: Cardiomegaly and mild vascular congestion. Prior CABG. Prominent markings at the right base are redemonstrated consistent with pneumonia.  There may be slight improvement compared with prior film.  Small bilateral effusions are present.  IMPRESSION: Prominent markings right base consistent with pneumonia appears slightly improved compared with priors.  Cardiomegaly.   Original Report Authenticated By: Davonna Belling, M.D.      No diagnosis found.    MDM   Date: 08/27/2012  Rate: 78  Rhythm: atrial fibrillation  QRS Axis: normal  Intervals: normal  ST/T Wave abnormalities: nonspecific ST/T changes  Conduction Disutrbances:nonspecific intraventricular conduction delay  Narrative Interpretation:   Old EKG Reviewed: unchanged  Pt with fever, marked leukocytosis and CXR concerning for PNA. Multiple recent admissions with treat as HCAP. He remains hypotensive, getting judicious fluids due to history of CHF, but normal lactate, so doubt this is septic shock. Admit to hospitalist. Family aware of plan.      I personally performed the services described in this documentation, which was scribed in my presence. The recorded information has been reviewed and is accurate.     Charles B. Bernette Mayers, MD 08/27/12 2111

## 2012-08-27 NOTE — H&P (Signed)
[facility] History and Physical  [name] ZOX:[id number] DOB: Oct 24, 1921 DOA: 08/27/2012  Referring physician:  PCP: [name], MD   Chief Complaint: hypotension  HPI: [name] is a 79 y.o. male with history of combined systolic and diastolic heart failure, chronic respiratory failure on home oxygen, COPD, afib, aortic stenosis who presents to the Emergency Department from the [facility] with hypotension. Per [facility] staff, patient has been more lethargic than baseline. Of note , patient discharged from [facility] 2/27 after 8 day stay for acute respiratory failure secondary to decompensated HF. Information obtained from chart and patient.  His last BP reading at [facility] prior to arrival was 72/42 with O2 sat of 88% on L and HR 88. He denies shortness of breath, abdominal pain, chest pain, back pain, vomiting or fever. He denies dizziness. He does report "hurt all over". Work up in ED yields SBP in 70's, temp 100.5, white count 27.6 with greater than 20% bands. chest xray with possible slight improvement in pna. In Ed given fluids and started on zosyn and vancomycin.  Patient has an extensive cardiac history which includes: MVR, congestive heart failure, ASCVD, atrial fibrillation, NSTEMI, aortic stenosis and CABG. Other medical history includes hypertension, hyperlipidemia, prostate cancer and diabetes. On exam pt lethargic but responsive, skin cool/dry/greyish. Symptoms came on suddenly, have persisted, are characterized as serious. We are asked to admit.   Review of Systems: The patient denies anorexia, fever, weight loss,, vision loss,  hoarseness, chest pain, syncope, dyspnea on exertion. Other systems not reviewed due to patient lethargy   Past Medical History  Diagnosis Date  . Mitral valve prolapse 2003    MVR in 2003  . Obesity   . Osteoarthritis   . Hypertension   . Hyperlipidemia   . COPD (chronic obstructive pulmonary disease)   . CHF  (congestive heart failure) diastolic 10/2010    CXR in 12/2010: Prior CABG, cardiomegaly, vascular redistribution, bibasilar atelectasis, small effusions  . Pneumonia   . Impaired glucose tolerance   . Atrial fibrillation, chronic   . Prostate cancer     elevated PSA  . GERD (gastroesophageal reflux disease)     + hiatal hernia  . Arteriosclerotic cardiovascular disease (ASCVD) 1994    stent to RCA; CABG-2003  . Hard of hearing   . Anemia, iron deficiency   . AAA (abdominal aortic aneurysm)     Fusiform; infrarenal; 4-4.1cm on CT in 07/2008 and 12/2010 by MRI  . Gastric ulcer 2004    2004; upper GI bleed  . Renal cysts, acquired, bilateral     Complex by MRI in 12/2010  . Adenomatous polyps 04/07/2012  . HYPERTENSION 12/17/2007    Subsequently hypotensive and all antihypertensive medication discontinued Lab  03/2012: Mild anemia with H&H-11.8/37.5, MCV-97, ferritin-224, normal CMet ex G-124, alb-3.4   . NSTEMI (non-ST elevated myocardial infarction) 01/18/2011  . Diabetes mellitus, type II 07/13/12    family denies patient is diabetic  . Macrocytosis without anemia 08/13/2012  . Acute systolic congestive heart failure 08/15/2012    EF 35-40%, per Echo  . Aortic stenosis, moderate 08/15/2012  . Pulmonary hypertension due to COPD 08/15/2012    53 mm Hg  . Tricuspid valve regurgitation 08/15/2012  . Retroperitoneal bleed 06/2012   Past Surgical History  Procedure Laterality Date  . Knee surgery      Left  . Bladder surgery    . Femoral artery stent  12-06-10    Left SFA  .  Appendectomy    . Mitral valve replacement (mvr)/coronary artery bypass grafting (cabg)  2003    stent to RCA in 1994  . Eye surgery  2007    bilateral cataracts  . Tonsillectomy      thinks they were removed while in the [employer]  . Mass excision  06/07/2011    Procedure: EXCISION MASS;  Surgeon: Fabio Bering;  Location: [facility];  Service: General;  Laterality: N/A;  excision of 2 masses back and buttocks  . Back  surgery    . Esophagogastroduodenoscopy  03/03/2003    Large, deep prepyloric ulcer, as described above without bleeding/ Normal esophagus  . Esophagogastroduodenoscopy  09/03/2004    normal throughout  . Colonoscopy  09/03/2004    small ulcer, without stigmata of bleeding  . Esophagogastroduodenoscopy  11/14/2004    Normal esophagus and small hiatal hernia, otherwise normal stomach   . Colonoscopy  11/14/2004    Internal hemorrhoids, otherwise normal rectum/ left-sided diverticula , diffusely oozing right colon mucosa without a discrete lesion amenable to endoscopic therapy. 2 diminutive polyps. FELT TO HAVE AVMs/telangiectasias  . Colonoscopy  01/29/2005    Normal rectum  . Colonoscopy  03/11/2012    Colonic diverticulosis. Colonic polyps-removed as described above. Vascular anomalies in the cecum likely representing hemangiomas. Status post hemostasis clipping of  2 of the 3. ADENOMATOUS POLYPS. Repeat 2016  . Esophagogastroduodenoscopy  03/11/2012    Deformity of the antrum;  small polyp in antrum-not manipulated. Otherwise normal exam   Social History:  reports that he quit smoking about 5 years ago. His smoking use included Cigarettes. He has a 70 pack-year smoking history. He has quit using smokeless tobacco. He reports that he does not drink alcohol or use illicit drugs. Currently residing at [facility]. Total assist with ADL's  Allergies  Allergen Reactions  . Aspirin   . Gabapentin     Patient cannot take over 200 mg dose at a time as it causes jerking or spasms  . Naproxen     Family History  Problem Relation Age of Onset  . Stroke Mother   . Stroke Brother   . Heart attack Brother   . Cancer Brother   . Cancer Brother   . Anesthesia problems Neg Hx   . Hypotension Neg Hx   . Malignant hyperthermia Neg Hx   . Pseudochol deficiency Neg Hx      Prior to Admission medications   Medication Sig Start Date End Date Taking? Authorizing Provider  amoxicillin-clavulanate  (AUGMENTIN) 875-125 MG per tablet Take 1 tablet by mouth 2 (two) times daily. 08/20/12 08/27/12 Yes Gwenyth Bender, NP  atorvastatin (LIPITOR) 10 MG tablet Take 1 tablet (10 mg total) by mouth daily at 6 PM. 08/20/12  Yes Gwenyth Bender, NP  carvedilol (COREG) 6.25 MG tablet Take 1 tablet (6.25 mg total) by mouth 2 (two) times daily with a meal. 08/20/12  Yes Lesle Chris Black, NP  digoxin (LANOXIN) 0.125 MG tablet Take 1 tablet (0.125 mg total) by mouth daily. 08/20/12  Yes Lesle Chris Black, NP  Fluticasone-Salmeterol (ADVAIR) 100-50 MCG/DOSE AEPB Inhale 1 puff into the lungs every 12 (twelve) hours.   Yes Historical Provider, MD  furosemide (LASIX) 20 MG tablet Take 1 tablet (20 mg total) by mouth 2 (two) times daily. 08/20/12  Yes Lesle Chris Black, NP  lisinopril (PRINIVIL,ZESTRIL) 2.5 MG tablet Take 1 tablet (2.5 mg total) by mouth daily. 08/20/12  Yes Gwenyth Bender, NP  Multiple Vitamin (  MULTIVITAMIN WITH MINERALS) TABS Take 1 tablet by mouth daily.   Yes Historical Provider, MD  nystatin (MYCOSTATIN) 100000 UNIT/ML suspension Take 500,000 Units by mouth 4 (four) times daily.   Yes Historical Provider, MD  omeprazole (PRILOSEC) 40 MG capsule Take 40 mg by mouth every morning.    Yes Historical Provider, MD  potassium chloride SA (K-DUR,KLOR-CON) 20 MEQ tablet Take 20 mEq by mouth 2 (two) times daily.   Yes Historical Provider, MD  spironolactone (ALDACTONE) 25 MG tablet Take 1 tablet (25 mg total) by mouth daily. 08/20/12  Yes Erick Blinks, MD  Tamsulosin HCl (FLOMAX) 0.4 MG CAPS Take 0.4 mg by mouth every morning. 07/15/12  Yes Christiane Ha, MD  acetaminophen (TYLENOL) 325 MG tablet Take 650 mg by mouth every 4 (four) hours as needed for pain. 08/20/12   Gwenyth Bender, NP  albuterol (PROVENTIL) (2.5 MG/3ML) 0.083% nebulizer solution Take 3 mLs (2.5 mg total) by nebulization every 6 (six) hours as needed. Mixes with the Ipratropium(for shortness of breath) 07/02/12   Kerri Perches, MD  benzonatate (TESSALON)  100 MG capsule Take 100 mg by mouth every 6 (six) hours as needed for cough.    Historical Provider, MD  guaiFENesin (MUCINEX) 600 MG 12 hr tablet Take 600 mg by mouth 2 (two) times daily as needed. congestion    Historical Provider, MD  ipratropium (ATROVENT) 0.02 % nebulizer solution Take 500 mcg by nebulization every 6 (six) hours as needed. Shortness of breath    Historical Provider, MD  oxymetazoline (AFRIN) 0.05 % nasal spray Place 1 spray into the nose daily as needed. Congestion    Historical Provider, MD   Physical Exam: Filed Vitals:   08/27/12 1323 08/27/12 1329 08/27/12 1457 08/27/12 1501  BP: 82/42 82/42 77/49    Pulse: 80 75 73   Temp: 98.1 F (36.7 C) 98.1 F (36.7 C) 100.4 F (38 C)   TempSrc: Oral Oral    Resp: 26 26 26    Height: 6\' 3"  (1.905 m) 6\' 3"  (1.905 m)  6\' 3"  (1.905 m)  Weight: 117.028 kg (258 lb) 117.028 kg (258 lb)  103.874 kg (229 lb)  SpO2: 92% 94% 95%      General:  Lethargic ill appearing  Eyes: PERRL EOMI   ENT: mucus membrane mouth pink but dry, ears clear, nose without drainage  Neck: supple no JVD, no lymphadenopathy  Cardiovascular: Irregular, +murmur 1+ lower extremity edema  Respiratory: normal effort BS distant but clear. No wheeze no crackles  Abdomen: obese, soft +BS non-tender to palpation  Skin: cool, dry several bruises, abrasions on arms. No rash  Musculoskeletal: joints without swelling erythema. Bilateral knee tender to palpation  Psychiatric: cooperative   Neurologic: oriented to self. Follows commands. Lethargic. Bilateral grip 4/5  Labs on Admission:  Basic Metabolic Panel:  Recent Labs Lab 08/27/12 1348  NA 139  K 4.9  CL 95*  CO2 38*  GLUCOSE 121*  BUN 27*  CREATININE 1.08  CALCIUM 8.9   Liver Function Tests: No results found for this basename: AST, ALT, ALKPHOS, BILITOT, PROT, ALBUMIN,  in the last 168 hours No results found for this basename: LIPASE, AMYLASE,  in the last 168 hours No results found for  this basename: AMMONIA,  in the last 168 hours CBC:  Recent Labs Lab 08/27/12 1348  WBC 27.6*  NEUTROABS 25.1*  HGB 13.2  HCT 41.4  MCV 101.5*  PLT 135*   Cardiac Enzymes:  Recent Labs Lab 08/27/12 1348  TROPONINI <0.30    BNP (last 3 results)  Recent Labs  08/12/12 1755 08/17/12 0441 08/27/12 1348  PROBNP 5479.0* 6458.0* 8686.0*   CBG:  Recent Labs Lab 08/26/12 0525 08/26/12 1221 08/26/12 1658 08/26/12 2028 08/27/12 0534  GLUCAP 92 114* 171* 150* 78    Radiological Exams on Admission: Dg Chest Port 1 View  08/27/2012  *RADIOLOGY REPORT*  Clinical Data: Hypotension.  Hypoxia.  PORTABLE CHEST - 1 VIEW  Comparison: 08/19/2012  Findings: Cardiomegaly and mild vascular congestion. Prior CABG. Prominent markings at the right base are redemonstrated consistent with pneumonia.  There may be slight improvement compared with prior film.  Small bilateral effusions are present.  IMPRESSION: Prominent markings right base consistent with pneumonia appears slightly improved compared with priors.  Cardiomegaly.   Original Report Authenticated By: Davonna Belling, M.D.     EKG: Independently reviewed and yields afib with non-specific ST changes not unlike old EKG  Assessment/Plan Principal Problem: Septic shock:  possibly HCAP. Will admit to SDU. Urine unremarkable, chest xray with unchanged with possible improvement in earlier pneumonia.  Will continue IV fluids and start vancomycin, primaxin and azithromycin. Will check procalcitonin. Some concern for adrenal crisis given recent steroid burst. Will provide stress dose steroids. Will hold home meds that lower BP. Monitor closely    HCAP (healthcare-associated pneumonia): see #1.  Active Problems:   COPD/Chronic respiratory failure: sats 96% on 4L [place]. Will continue nebs and monitor closely.    Atrial fibrillation, chronic: rate controlled. Continue dig.  Aspirin and plavix discontinued previously secondary to recurrent GI bleed in  past. Monitor tele      Heart failure, systolic and diastolic: see #1. IV fluids mindful of hx HF. Last echo yields EF 35%. Will monitor daily weight and strict intake and output. No evidence of acute component.  Anemia: iron deficiency. Currently 13. Baseline seems to be 11-12. Likely due to some dehydration. No s/sx bleeding. Will monitor closely.  Code Status: full Family Communication:  Disposition Plan: facility when ready  Time spent: 65 minutes  [facility]   If 7PM-7AM, please contact night-coverage [email or web address] Password [facility] 08/27/2012, 4:00 PM  Attending note:  The patient is in shock, likely septic etiology. Chest x-ray is not much changed, and too soon to show resolution from recent pneumonia. Could also be a component of relative adrenal insufficiency, as he has been on and off steroids for quite some time and recently weaned off. Patient has received 500 cc of saline. Will give a stress dose steroids. May need more fluid. He'll be monitored and in down. His CODE STATUS is full. Patient's daughter is not currently available and he is to lethargic to discuss goals of care. This is the fifth time he's been hospitalized in the past 2 months for various reasons. He was admitted to the hospital multiple times last year as well. He certainly has not thrived and quality of life is questionable. Will need to discuss reasonable expectations and goals of care once patient's daughter is available. Long-term prognosis is guarded. He has been in and out of the hospital and skilled nursing facility despite aggressive management. Critical care time 60 minutes  Crista Curb, M.D.

## 2012-08-27 NOTE — ED Notes (Signed)
Called ICU for update. Receiving nurse tied up with another pt, reported would call back soon.

## 2012-08-27 NOTE — ED Notes (Addendum)
Attempted report x 1. Receiving nurse stated would call back.

## 2012-08-28 DIAGNOSIS — I4891 Unspecified atrial fibrillation: Secondary | ICD-10-CM

## 2012-08-28 DIAGNOSIS — I959 Hypotension, unspecified: Principal | ICD-10-CM

## 2012-08-28 LAB — GLUCOSE, CAPILLARY: Glucose-Capillary: 115 mg/dL — ABNORMAL HIGH (ref 70–99)

## 2012-08-28 LAB — BASIC METABOLIC PANEL
BUN: 26 mg/dL — ABNORMAL HIGH (ref 6–23)
Chloride: 98 mEq/L (ref 96–112)
GFR calc Af Amer: 84 mL/min — ABNORMAL LOW (ref >90)
GFR calc non Af Amer: 73 mL/min — ABNORMAL LOW (ref >90)
Potassium: 4.5 mEq/L (ref 3.5–5.1)

## 2012-08-28 LAB — URINE CULTURE: Culture: NO GROWTH

## 2012-08-28 LAB — CBC WITH DIFFERENTIAL/PLATELET
Basophils Absolute: 0 10*3/uL (ref 0.0–0.1)
Basophils Relative: 0 % (ref 0–1)
Eosinophils Absolute: 0 10*3/uL (ref 0.0–0.7)
MCH: 32.3 pg (ref 26.0–34.0)
MCHC: 32.3 g/dL (ref 30.0–36.0)
Neutro Abs: 14.5 10*3/uL — ABNORMAL HIGH (ref 1.7–7.7)
Neutrophils Relative %: 89 % — ABNORMAL HIGH (ref 43–77)
Platelets: 117 10*3/uL — ABNORMAL LOW (ref 150–400)
RDW: 17.3 % — ABNORMAL HIGH (ref 11.5–15.5)

## 2012-08-28 NOTE — Clinical Social Work Psychosocial (Signed)
Clinical Social Work Department BRIEF PSYCHOSOCIAL ASSESSMENT 08/28/2012  Patient:  [name], [name]     Account Number:  [id number]     Admit date:  08/27/2012  Clinical Social Worker:  Nancie Neas  Date/Time:  08/28/2012 11:55 AM  Referred by:  CSW  Date Referred:  08/28/2012 Referred for  SNF Placement   Other Referral:   Interview type:  Family Other interview type:   [name]- daughter    PSYCHOSOCIAL DATA Living Status:  FACILITY Admitted from facility:  [facility] Level of care:  Skilled Nursing Facility Primary support name:  [name] Primary support relationship to patient:  CHILD, ADULT Degree of support available:   supportive    CURRENT CONCERNS Current Concerns  Post-Acute Placement   Other Concerns:    SOCIAL WORK ASSESSMENT / PLAN CSW attempted to meet with pt at bedside but he was asleep. CSW called pt's daughter [name]. Pt is well known to CSW from previous admissions. Pt has been resident at [facility] since d/c from hospital last week. [name] reports pt has been doing okay, but has been very weak still and unable to ambulate yet. Per [name] at facility, okay for return at d/c and no FL2 needed. [name] is not holding bed but would like for pt to return to [facility] pending bed availability.   Assessment/plan status:  Psychosocial Support/Ongoing Assessment of Needs Other assessment/ plan:   Information/referral to community resources:   [facility]    PATIENT'S/FAMILY'S RESPONSE TO PLAN OF CARE: Pt unable to discuss plan of care at this time. Daughter feels pt needs to return to complete rehab prior to returning home. She plans to call [name] today to discuss insurance questions.        [name], [place] [phone]

## 2012-08-28 NOTE — Progress Notes (Signed)
PT TRANSFERRED TO ROOM 330 VIA W/C. TRANSFER REPORT CALLED TO TAMIKA WATKINS RN ON 300. O2 VIA [place] AT 4L/MIN.DIMINISHED BREATH SOUNDS. DENIES SOB. LT FOREARM IV PATENT. FOLEY CATH PATENT DRAINING CLEAR YELLOW URINE.  ALERT AND ORIENTED. VSS.

## 2012-08-28 NOTE — Progress Notes (Signed)
UR Chart Review Completed  

## 2012-08-28 NOTE — Progress Notes (Signed)
     Subjective: This man appears to be improving, he feels better overall. His blood pressure is somewhat improved. He was able to have and tolerated breakfast without any problems.           Physical Exam: Blood pressure 90/54, pulse 91, temperature 97.8 F (36.6 C), temperature source Oral, resp. rate 19, height 6\' 3"  (1.905 m), weight 101.8 kg (224 lb 6.9 oz), SpO2 96.00%. He looks systemically well. Despite the soft blood pressure. He is not clinically shock. He is in atrial fibrillation with ventricular rate that is controlled. Lung fields appear to be clear except for some scattered wheezing. He does appear to be alert and orientated. He does have some peripheral pitting edema in his lower legs.   Investigations:  Recent Results (from the past 240 hour(s))  MRSA PCR SCREENING     Status: None   Collection Time    08/27/12  7:17 PM      Result Value Range Status   MRSA by PCR NEGATIVE  NEGATIVE Final   Comment:            The GeneXpert MRSA Assay (FDA     approved for NASAL specimens     only), is one component of a     comprehensive MRSA colonization     surveillance program. It is not     intended to diagnose MRSA     infection nor to guide or     monitor treatment for     MRSA infections.     Basic Metabolic Panel:  Recent Labs  78/29/56 1348 08/28/12 0439  NA 139 140  K 4.9 4.5  CL 95* 98  CO2 38* 37*  GLUCOSE 121* 103*  BUN 27* 26*  CREATININE 1.08 0.93  CALCIUM 8.9 8.5       CBC:  Recent Labs  08/27/12 1348 08/28/12 0439  WBC 27.6* 16.3*  NEUTROABS 25.1* 14.5*  HGB 13.2 11.8*  HCT 41.4 36.5*  MCV 101.5* 100.0  PLT 135* 117*    Dg Chest Port 1 View  08/27/2012  *RADIOLOGY REPORT*  Clinical Data: Hypotension.  Hypoxia.  PORTABLE CHEST - 1 VIEW  Comparison: 08/19/2012  Findings: Cardiomegaly and mild vascular congestion. Prior CABG. Prominent markings at the right base are redemonstrated consistent with pneumonia.  There may be slight  improvement compared with prior film.  Small bilateral effusions are present.  IMPRESSION: Prominent markings right base consistent with pneumonia appears slightly improved compared with priors.  Cardiomegaly.   Original Report Authenticated By: Davonna Belling, M.D.       Medications: I have reviewed the patient's current medications.  Impression: 1. Shock, possibly septic. No real evidence of pneumonia that is new but this is a possibility. 2. Chronic atrial fibrillation, rate controlled. Not a candidate for anticoagulation in view of his retroperitoneal hematoma. 3. History of systolic and diastolic congestive heart failure. He appears to be currently not in clinical heart failure.     Plan: 1. Continue with current treatment. 2. Moved to telemetry and try to mobilize.     LOS: 1 day   Wilson Singer Pager [phone]  08/28/2012, 9:58 AM

## 2012-08-29 DIAGNOSIS — E669 Obesity, unspecified: Secondary | ICD-10-CM

## 2012-08-29 DIAGNOSIS — J9600 Acute respiratory failure, unspecified whether with hypoxia or hypercapnia: Secondary | ICD-10-CM

## 2012-08-29 DIAGNOSIS — G9340 Encephalopathy, unspecified: Secondary | ICD-10-CM

## 2012-08-29 LAB — COMPREHENSIVE METABOLIC PANEL
ALT: 23 U/L (ref 0–53)
AST: 18 U/L (ref 0–37)
Alkaline Phosphatase: 62 U/L (ref 39–117)
CO2: 35 mEq/L — ABNORMAL HIGH (ref 19–32)
GFR calc Af Amer: >90 mL/min (ref >90)
GFR calc non Af Amer: 83 mL/min — ABNORMAL LOW (ref >90)
Glucose, Bld: 101 mg/dL — ABNORMAL HIGH (ref 70–99)
Potassium: 3.4 mEq/L — ABNORMAL LOW (ref 3.5–5.1)
Sodium: 138 mEq/L (ref 135–145)

## 2012-08-29 LAB — GLUCOSE, CAPILLARY

## 2012-08-29 LAB — CBC
Hemoglobin: 11.3 g/dL — ABNORMAL LOW (ref 13.0–17.0)
RBC: 3.5 MIL/uL — ABNORMAL LOW (ref 4.22–5.81)
WBC: 10.4 10*3/uL (ref 4.0–10.5)

## 2012-08-29 LAB — VANCOMYCIN, TROUGH: Vancomycin Tr: 14.3 ug/mL (ref 10.0–20.0)

## 2012-08-29 MED ORDER — AZITHROMYCIN 250 MG PO TABS
500.0000 mg | ORAL_TABLET | ORAL | Status: DC
  Administered 2012-08-29: 500 mg via ORAL
  Filled 2012-08-29: qty 2

## 2012-08-29 MED ORDER — PREDNISONE 20 MG PO TABS
40.0000 mg | ORAL_TABLET | Freq: Every day | ORAL | Status: DC
  Administered 2012-08-29 – 2012-08-31 (×3): 40 mg via ORAL
  Filled 2012-08-29 (×3): qty 2

## 2012-08-29 MED ORDER — ADULT MULTIVITAMIN W/MINERALS CH
1.0000 | ORAL_TABLET | Freq: Every day | ORAL | Status: DC
  Administered 2012-08-29 – 2012-08-31 (×3): 1 via ORAL
  Filled 2012-08-29 (×3): qty 1

## 2012-08-29 MED ORDER — SODIUM CHLORIDE 0.9 % IV SOLN
500.0000 mg | Freq: Four times a day (QID) | INTRAVENOUS | Status: DC
  Administered 2012-08-29 – 2012-08-31 (×7): 500 mg via INTRAVENOUS
  Filled 2012-08-29 (×12): qty 500

## 2012-08-29 MED ORDER — POTASSIUM CHLORIDE CRYS ER 20 MEQ PO TBCR
40.0000 meq | EXTENDED_RELEASE_TABLET | Freq: Once | ORAL | Status: AC
  Administered 2012-08-29: 40 meq via ORAL
  Filled 2012-08-29: qty 2

## 2012-08-29 NOTE — Progress Notes (Signed)
INITIAL NUTRITION ASSESSMENT  DOCUMENTATION CODES Per approved criteria  -Not Applicable   INTERVENTION: Add MVI, to provide continuity of nutrition care.   NUTRITION DIAGNOSIS: No nutrition dx at this time  Goal: 1) Pt will maintain current wt of 224# 2) Pt will meet >75% of estimated needs  Monitor:  PO intake, labs, weight changes, skin integrity, changes in status  Reason for Assessment: MST=4  79 y.o. male  Admitting Dx: HCAP (healthcare-associated pneumonia)  ASSESSMENT: Pt is a resident of [facility]. Noted pt was recently discharged from hospital after hospitalization on 08/12/12. RD evaluated on 2/27 and pt with good PO's during that hospitalization.  Chart reviewed. Pt asleep at time of visit and could not arouse. No family present at time of visit.  PO intake continues to be good (PO: 75-100%). Noted that wt loss of 14# (5.9%) x 1 month, which is significant, but likely due to fluid, as pt is on diuretics. Noted a 34# (13%) wt loss during this admission.  Reviewed MAR from [facility] and noted that pt is on a MVI at [facility]. Will order MVI during hospitalization for continuity of care.   Height: Ht Readings from Last 1 Encounters:  08/27/12 6\' 3"  (1.905 m)    Weight: Wt Readings from Last 1 Encounters:  08/28/12 224 lb 6.9 oz (101.8 kg)    Ideal Body Weight: 196#  % Ideal Body Weight: 114#  Wt Readings from Last 10 Encounters:  08/28/12 224 lb 6.9 oz (101.8 kg)  08/18/12 231 lb 11.3 oz (105.1 kg)  08/04/12 238 lb 5.1 oz (108.1 kg)  07/28/12 238 lb 5.1 oz (108.1 kg)  07/14/12 246 lb 0.5 oz (111.6 kg)  05/27/12 240 lb (108.863 kg)  05/13/12 235 lb (106.595 kg)  05/06/12 235 lb (106.595 kg)  04/13/12 229 lb 9.6 oz (104.146 kg)  03/31/12 224 lb (101.606 kg)    Usual Body Weight: 225-230#  % Usual Body Weight: 100%  BMI:  Body mass index is 28.05 kg/(m^2). Classified as overweight.   Estimated Nutritional Needs: Kcal: 1700-1800 daily Protein: 82-102 grams  daily Fluid: 1 ml/kcal daily  Skin: Dryness and cracking on heels and foot; skin tear on rt arm  Diet Order: Cardiac  EDUCATION NEEDS: -No education needs identified at this time   Intake/Output Summary (Last 24 hours) at 08/29/12 1411 Last data filed at 08/29/12 1248  Gross per 24 hour  Intake 2765.33 ml  Output   1250 ml  Net 1515.33 ml    Last BM: 08/27/12   Labs:   Recent Labs Lab 08/27/12 1348 08/28/12 0439 08/29/12 0435  NA 139 140 138  K 4.9 4.5 3.4*  CL 95* 98 98  CO2 38* 37* 35*  BUN 27* 26* 23  CREATININE 1.08 0.93 0.68  CALCIUM 8.9 8.5 8.6  GLUCOSE 121* 103* 101*    CBG (last 3)   Recent Labs  08/27/12 0534 08/28/12 0739 08/29/12 0833  GLUCAP 78 115* 125*    Scheduled Meds: . albuterol  2.5 mg Nebulization QID  . atorvastatin  10 mg Oral q1800  . azithromycin  500 mg Oral Q24H  . digoxin  0.125 mg Oral Daily  . enoxaparin (LOVENOX) injection  40 mg Subcutaneous Q24H  . imipenem-cilastatin  500 mg Intravenous Q6H  . ipratropium  500 mcg Nebulization QID  . mometasone-formoterol  2 puff Inhalation BID  . pantoprazole  80 mg Oral Daily  . predniSONE  40 mg Oral Q breakfast  . sodium chloride  3  mL Intravenous Q12H  . tamsulosin  0.4 mg Oral q morning - 10a  . vancomycin  1,000 mg Intravenous Q12H    Continuous Infusions:   Past Medical History  Diagnosis Date  . Mitral valve prolapse 2003    MVR in 2003  . Obesity   . Osteoarthritis   . Hypertension   . Hyperlipidemia   . COPD (chronic obstructive pulmonary disease)   . CHF (congestive heart failure) diastolic 10/2010    CXR in 12/2010: Prior CABG, cardiomegaly, vascular redistribution, bibasilar atelectasis, small effusions  . Pneumonia   . Impaired glucose tolerance   . Atrial fibrillation, chronic   . Prostate cancer     elevated PSA  . GERD (gastroesophageal reflux disease)     + hiatal hernia  . Arteriosclerotic cardiovascular disease (ASCVD) 1994    stent to RCA;  CABG-2003  . Hard of hearing   . Anemia, iron deficiency   . AAA (abdominal aortic aneurysm)     Fusiform; infrarenal; 4-4.1cm on CT in 07/2008 and 12/2010 by MRI  . Gastric ulcer 2004    2004; upper GI bleed  . Renal cysts, acquired, bilateral     Complex by MRI in 12/2010  . Adenomatous polyps 04/07/2012  . HYPERTENSION 12/17/2007    Subsequently hypotensive and all antihypertensive medication discontinued Lab  03/2012: Mild anemia with H&H-11.8/37.5, MCV-97, ferritin-224, normal CMet ex G-124, alb-3.4   . NSTEMI (non-ST elevated myocardial infarction) 01/18/2011  . Diabetes mellitus, type II 07/13/12    family denies patient is diabetic  . Macrocytosis without anemia 08/13/2012  . Acute systolic congestive heart failure 08/15/2012    EF 35-40%, per Echo  . Aortic stenosis, moderate 08/15/2012  . Pulmonary hypertension due to COPD 08/15/2012    53 mm Hg  . Tricuspid valve regurgitation 08/15/2012  . Retroperitoneal bleed 06/2012    Past Surgical History  Procedure Laterality Date  . Knee surgery      Left  . Bladder surgery    . Femoral artery stent  12-06-10    Left SFA  . Appendectomy    . Mitral valve replacement (mvr)/coronary artery bypass grafting (cabg)  2003    stent to RCA in 1994  . Eye surgery  2007    bilateral cataracts  . Tonsillectomy      thinks they were removed while in the [employer]  . Mass excision  06/07/2011    Procedure: EXCISION MASS;  Surgeon: Fabio Bering;  Location: [facility];  Service: General;  Laterality: N/A;  excision of 2 masses back and buttocks  . Back surgery    . Esophagogastroduodenoscopy  03/03/2003    Large, deep prepyloric ulcer, as described above without bleeding/ Normal esophagus  . Esophagogastroduodenoscopy  09/03/2004    normal throughout  . Colonoscopy  09/03/2004    small ulcer, without stigmata of bleeding  . Esophagogastroduodenoscopy  11/14/2004    Normal esophagus and small hiatal hernia, otherwise normal stomach   . Colonoscopy   11/14/2004    Internal hemorrhoids, otherwise normal rectum/ left-sided diverticula , diffusely oozing right colon mucosa without a discrete lesion amenable to endoscopic therapy. 2 diminutive polyps. FELT TO HAVE AVMs/telangiectasias  . Colonoscopy  01/29/2005    Normal rectum  . Colonoscopy  03/11/2012    Colonic diverticulosis. Colonic polyps-removed as described above. Vascular anomalies in the cecum likely representing hemangiomas. Status post hemostasis clipping of  2 of the 3. ADENOMATOUS POLYPS. Repeat 2016  . Esophagogastroduodenoscopy  03/11/2012  Deformity of the antrum;  small polyp in antrum-not manipulated. Otherwise normal exam    Melody Haver, RD, LDN Pager: [phone]

## 2012-08-29 NOTE — Progress Notes (Signed)
Took o2 off patient for 10 minutes to attempt room air oxygen saturation.  Patient resting comfortably in bed.  Checked oxygen saturation and found 76% on room air.  Replaced oxygen back at 4L/min as patient was previously requiring this amount.  Patient rebounded back up to 93% on 4L.  Patient asymptomatic with no c/o of sob.  Will continue to monitor and make MD aware.  Schonewitz, Candelaria Stagers 08/29/2012

## 2012-08-29 NOTE — Progress Notes (Addendum)
ANTIBIOTIC CONSULT NOTE  Pharmacy Consult for Vancomycin Indication: rule out sepsis  Allergies  Allergen Reactions  . Aspirin   . Gabapentin     Patient cannot take over 200 mg dose at a time as it causes jerking or spasms  . Naproxen     Patient Measurements: Height: 6\' 3"  (190.5 cm) Weight: 224 lb 6.9 oz (101.8 kg) IBW/kg (Calculated) : 84.5  Vital Signs: Temp: 97.6 F (36.4 C) (03/08 0500) Temp src: Oral (03/08 0500) BP: 149/78 mmHg (03/08 0500) Pulse Rate: 78 (03/08 0500) Intake/Output from previous day: 03/07 0701 - 03/08 0700 In: 3605.3 [P.O.:1980; I.V.:475.3; IV Piggyback:1150] Out: 1625 [Urine:1625] Intake/Output from this shift:    Labs:  Recent Labs  08/27/12 1348 08/28/12 0439 08/29/12 0435  WBC 27.6* 16.3* 10.4  HGB 13.2 11.8* 11.3*  PLT 135* 117* 115*  CREATININE 1.08 0.93 0.68   Estimated Creatinine Clearance: 82.5 ml/min (by C-G formula based on Cr of 0.68).  Recent Labs  08/29/12 0435  VANCOTROUGH 14.3     Microbiology: Recent Results (from the past 720 hour(s))  URINE CULTURE     Status: None   Collection Time    08/27/12  2:59 PM      Result Value Range Status   Specimen Description URINE, CLEAN CATCH   Final   Special Requests NONE   Final   Culture  Setup Time 08/28/2012 01:06   Final   Colony Count NO GROWTH   Final   Culture NO GROWTH   Final   Report Status 08/28/2012 FINAL   Final  MRSA PCR SCREENING     Status: None   Collection Time    08/27/12  7:17 PM      Result Value Range Status   MRSA by PCR NEGATIVE  NEGATIVE Final   Comment:            The GeneXpert MRSA Assay (FDA     approved for NASAL specimens     only), is one component of a     comprehensive MRSA colonization     surveillance program. It is not     intended to diagnose MRSA     infection nor to guide or     monitor treatment for     MRSA infections.    Medical History: Past Medical History  Diagnosis Date  . Mitral valve prolapse 2003    MVR  in 2003  . Obesity   . Osteoarthritis   . Hypertension   . Hyperlipidemia   . COPD (chronic obstructive pulmonary disease)   . CHF (congestive heart failure) diastolic 10/2010    CXR in 12/2010: Prior CABG, cardiomegaly, vascular redistribution, bibasilar atelectasis, small effusions  . Pneumonia   . Impaired glucose tolerance   . Atrial fibrillation, chronic   . Prostate cancer     elevated PSA  . GERD (gastroesophageal reflux disease)     + hiatal hernia  . Arteriosclerotic cardiovascular disease (ASCVD) 1994    stent to RCA; CABG-2003  . Hard of hearing   . Anemia, iron deficiency   . AAA (abdominal aortic aneurysm)     Fusiform; infrarenal; 4-4.1cm on CT in 07/2008 and 12/2010 by MRI  . Gastric ulcer 2004    2004; upper GI bleed  . Renal cysts, acquired, bilateral     Complex by MRI in 12/2010  . Adenomatous polyps 04/07/2012  . HYPERTENSION 12/17/2007    Subsequently hypotensive and all antihypertensive medication discontinued Lab  03/2012: Mild anemia  with H&H-11.8/37.5, MCV-97, ferritin-224, normal CMet ex G-124, alb-3.4   . NSTEMI (non-ST elevated myocardial infarction) 01/18/2011  . Diabetes mellitus, type II 07/13/12    family denies patient is diabetic  . Macrocytosis without anemia 08/13/2012  . Acute systolic congestive heart failure 08/15/2012    EF 35-40%, per Echo  . Aortic stenosis, moderate 08/15/2012  . Pulmonary hypertension due to COPD 08/15/2012    53 mm Hg  . Tricuspid valve regurgitation 08/15/2012  . Retroperitoneal bleed 06/2012    Medications:  Scheduled:  . albuterol  2.5 mg Nebulization QID  . atorvastatin  10 mg Oral q1800  . azithromycin  500 mg Intravenous Q24H  . digoxin  0.125 mg Oral Daily  . enoxaparin (LOVENOX) injection  40 mg Subcutaneous Q24H  . hydrocortisone sod succinate (SOLU-CORTEF) injection  100 mg Intravenous Q12H  . imipenem-cilastatin  500 mg Intravenous Q8H  . ipratropium  500 mcg Nebulization QID  . mometasone-formoterol  2  puff Inhalation BID  . pantoprazole  80 mg Oral Daily  . sodium chloride  3 mL Intravenous Q12H  . tamsulosin  0.4 mg Oral q morning - 10a  . vancomycin  1,000 mg Intravenous Q12H   Assessment: 79 yo M admitted with sepsis currently on day#3 empiric, broad-spectrum antibiotics with Vancomycin, Primaxin & Zithromax.   Renal function has improved to baseline. He is clinically improved.   Vancomycin trough at goal today.   Goal of Therapy:  Vancomycin trough level 15-20 mcg/ml  Plan:  Increase Primaxin 500mg  IV Q6h Change Zithromax to po Continue Vancomycin 1 GM IV every 12 hours Weekly Vancomycin trough level while on Vancomycin Monitor renal function and cx data  Duration of therapy per MD -Consider streamline antibiotics after 72 hrs since patient is clinically improved.   Elson Clan 08/29/2012,9:57 AM

## 2012-08-29 NOTE — Progress Notes (Signed)
Subjective: This man appears to be improving, he feels he is back to his baseline.           Physical Exam: Blood pressure 149/78, pulse 78, temperature 97.6 F (36.4 C), temperature source Oral, resp. rate 16, height 6\' 3"  (1.905 m), weight 101.8 kg (224 lb 6.9 oz), SpO2 97.00%. He looks systemically well. Despite the soft blood pressure. He is not clinically shock. He is in atrial fibrillation with ventricular rate that is controlled. Lung fields appear to be clear except for some scattered wheezing. He does appear to be alert and orientated. He does have some peripheral pitting edema in his lower legs.   Investigations:  Recent Results (from the past 240 hour(s))  URINE CULTURE     Status: None   Collection Time    08/27/12  2:59 PM      Result Value Range Status   Specimen Description URINE, CLEAN CATCH   Final   Special Requests NONE   Final   Culture  Setup Time 08/28/2012 01:06   Final   Colony Count NO GROWTH   Final   Culture NO GROWTH   Final   Report Status 08/28/2012 FINAL   Final  MRSA PCR SCREENING     Status: None   Collection Time    08/27/12  7:17 PM      Result Value Range Status   MRSA by PCR NEGATIVE  NEGATIVE Final   Comment:            The GeneXpert MRSA Assay (FDA     approved for NASAL specimens     only), is one component of a     comprehensive MRSA colonization     surveillance program. It is not     intended to diagnose MRSA     infection nor to guide or     monitor treatment for     MRSA infections.     Basic Metabolic Panel:  Recent Labs  16/10/96 0439 08/29/12 0435  NA 140 138  K 4.5 3.4*  CL 98 98  CO2 37* 35*  GLUCOSE 103* 101*  BUN 26* 23  CREATININE 0.93 0.68  CALCIUM 8.5 8.6       CBC:  Recent Labs  08/27/12 1348 08/28/12 0439 08/29/12 0435  WBC 27.6* 16.3* 10.4  NEUTROABS 25.1* 14.5*  --   HGB 13.2 11.8* 11.3*  HCT 41.4 36.5* 34.6*  MCV 101.5* 100.0 98.9  PLT 135* 117* 115*    Dg Chest Port 1  View  08/27/2012  *RADIOLOGY REPORT*  Clinical Data: Hypotension.  Hypoxia.  PORTABLE CHEST - 1 VIEW  Comparison: 08/19/2012  Findings: Cardiomegaly and mild vascular congestion. Prior CABG. Prominent markings at the right base are redemonstrated consistent with pneumonia.  There may be slight improvement compared with prior film.  Small bilateral effusions are present.  IMPRESSION: Prominent markings right base consistent with pneumonia appears slightly improved compared with priors.  Cardiomegaly.   Original Report Authenticated By: Davonna Belling, M.D.       Medications: I have reviewed the patient's current medications.  Impression: 1. Shock, possibly septic. No real evidence of pneumonia that is new but this is a possibility. White blood cell count is improving. 2. Chronic atrial fibrillation, rate controlled. Not a candidate for anticoagulation in view of his retroperitoneal hematoma. 3. History of systolic and diastolic congestive heart failure. He appears to be currently not in clinical heart failure. 4. Hypokalemia.  Plan: 1. Replete potassium. 2. Discontinue Foley catheter. 3. Await blood culture report.     LOS: 2 days   Wilson Singer Pager [phone]  08/29/2012, 10:42 AM

## 2012-08-30 DIAGNOSIS — I509 Heart failure, unspecified: Secondary | ICD-10-CM

## 2012-08-30 DIAGNOSIS — I359 Nonrheumatic aortic valve disorder, unspecified: Secondary | ICD-10-CM

## 2012-08-30 DIAGNOSIS — J9610 Chronic respiratory failure, unspecified whether with hypoxia or hypercapnia: Secondary | ICD-10-CM

## 2012-08-30 DIAGNOSIS — J9620 Acute and chronic respiratory failure, unspecified whether with hypoxia or hypercapnia: Secondary | ICD-10-CM

## 2012-08-30 LAB — BASIC METABOLIC PANEL
BUN: 22 mg/dL (ref 6–23)
Chloride: 101 mEq/L (ref 96–112)
GFR calc Af Amer: >90 mL/min (ref >90)
Glucose, Bld: 109 mg/dL — ABNORMAL HIGH (ref 70–99)
Potassium: 3 mEq/L — ABNORMAL LOW (ref 3.5–5.1)
Sodium: 140 mEq/L (ref 135–145)

## 2012-08-30 LAB — GLUCOSE, CAPILLARY
Glucose-Capillary: 101 mg/dL — ABNORMAL HIGH (ref 70–99)
Glucose-Capillary: 231 mg/dL — ABNORMAL HIGH (ref 70–99)

## 2012-08-30 MED ORDER — LISINOPRIL 5 MG PO TABS
2.5000 mg | ORAL_TABLET | Freq: Every day | ORAL | Status: DC
  Administered 2012-08-30 – 2012-08-31 (×2): 2.5 mg via ORAL
  Filled 2012-08-30 (×2): qty 1

## 2012-08-30 MED ORDER — POTASSIUM CHLORIDE CRYS ER 20 MEQ PO TBCR
40.0000 meq | EXTENDED_RELEASE_TABLET | Freq: Every day | ORAL | Status: DC
  Administered 2012-08-30 – 2012-08-31 (×2): 40 meq via ORAL
  Filled 2012-08-30 (×2): qty 2

## 2012-08-30 NOTE — Progress Notes (Signed)
Patient had a few episodes of VT and runs during the night.  Patient was asymptomatic during theses episodes and was adjusting himself in and out of the bed to the bedside commode.  The strips have been saved to Epic. Otherwise, patient has run A-fib throughout the night.

## 2012-08-30 NOTE — Progress Notes (Signed)
Subjective: This man appears to be improving, he feels he is back to his baseline.           Physical Exam: Blood pressure 158/91, pulse 102, temperature 98.2 F (36.8 C), temperature source Oral, resp. rate 18, height 6\' 3"  (1.905 m), weight 101.8 kg (224 lb 6.9 oz), SpO2 93.00%. He looks systemically well. Despite the soft blood pressure.He is in atrial fibrillation with ventricular rate that is controlled. Lung fields appear to be clear except for some scattered wheezing. He does appear to be alert and orientated. He does have some peripheral pitting edema in his lower legs.   Investigations:  Recent Results (from the past 240 hour(s))  URINE CULTURE     Status: None   Collection Time    08/27/12  2:59 PM      Result Value Range Status   Specimen Description URINE, CLEAN CATCH   Final   Special Requests NONE   Final   Culture  Setup Time 08/28/2012 01:06   Final   Colony Count NO GROWTH   Final   Culture NO GROWTH   Final   Report Status 08/28/2012 FINAL   Final  MRSA PCR SCREENING     Status: None   Collection Time    08/27/12  7:17 PM      Result Value Range Status   MRSA by PCR NEGATIVE  NEGATIVE Final   Comment:            The GeneXpert MRSA Assay (FDA     approved for NASAL specimens     only), is one component of a     comprehensive MRSA colonization     surveillance program. It is not     intended to diagnose MRSA     infection nor to guide or     monitor treatment for     MRSA infections.     Basic Metabolic Panel:  Recent Labs  16/10/96 0435 08/30/12 0541  NA 138 140  K 3.4* 3.0*  CL 98 101  CO2 35* 31  GLUCOSE 101* 109*  BUN 23 22  CREATININE 0.68 0.69  CALCIUM 8.6 8.7       CBC:  Recent Labs  08/27/12 1348 08/28/12 0439 08/29/12 0435  WBC 27.6* 16.3* 10.4  NEUTROABS 25.1* 14.5*  --   HGB 13.2 11.8* 11.3*  HCT 41.4 36.5* 34.6*  MCV 101.5* 100.0 98.9  PLT 135* 117* 115*    No results found.    Medications: I  have reviewed the patient's current medications.  Impression: 1. Shock, possibly septic, patient is clearly improved. There is no real evidence of an infectious source so the etiology of a shock is not entirely clear. Nonetheless he has improved. 2. Chronic atrial fibrillation, rate controlled. Not a candidate for anticoagulation in view of his retroperitoneal hematoma. 3. History of systolic and diastolic congestive heart failure. He appears to be currently not in clinical heart failure. 4. Hypokalemia.     Plan: 1. Replete potassium. Start small dose of ACE inhibitor in view of his heart failure. 2. Continue with other current therapy for the time being. 3. Have had a long discussion with the patient's daughter, Naida Sleight, who agrees for DO NOT RESUSCITATE. We will document this on the chart. 3. Disposition-home with hospice services versus skilled nursing facility. The daughter will make this decision tomorrow.     LOS: 3 days   Wilson Singer Pager [phone]  08/30/2012, 10:08 AM

## 2012-08-31 ENCOUNTER — Telehealth: Payer: Self-pay | Admitting: Family Medicine

## 2012-08-31 DIAGNOSIS — I5033 Acute on chronic diastolic (congestive) heart failure: Secondary | ICD-10-CM

## 2012-08-31 LAB — BASIC METABOLIC PANEL
BUN: 16 mg/dL (ref 6–23)
CO2: 32 mEq/L (ref 19–32)
Calcium: 8.6 mg/dL (ref 8.4–10.5)
Creatinine, Ser: 0.69 mg/dL (ref 0.50–1.35)
GFR calc non Af Amer: 82 mL/min — ABNORMAL LOW (ref >90)
Glucose, Bld: 99 mg/dL (ref 70–99)
Sodium: 141 mEq/L (ref 135–145)

## 2012-08-31 LAB — CBC
MCH: 32.5 pg (ref 26.0–34.0)
MCHC: 32.5 g/dL (ref 30.0–36.0)
MCV: 100 fL (ref 78.0–100.0)
Platelets: 114 10*3/uL — ABNORMAL LOW (ref 150–400)
RBC: 3.51 MIL/uL — ABNORMAL LOW (ref 4.22–5.81)
RDW: 16.8 % — ABNORMAL HIGH (ref 11.5–15.5)

## 2012-08-31 MED ORDER — MORPHINE SULFATE (CONCENTRATE) 10 MG /0.5 ML PO SOLN: 10.0000 mg | ORAL | Status: DC | PRN

## 2012-08-31 MED ORDER — LEVOFLOXACIN 500 MG PO TABS: 500.0000 mg | ORAL_TABLET | Freq: Every day | ORAL | Status: DC

## 2012-08-31 NOTE — Care Management Note (Signed)
    Page 1 of 1   08/31/2012     3:05:51 PM   CARE MANAGEMENT NOTE 08/31/2012  Patient:  [name], [name]   Account Number:  [id number]  Date Initiated:  08/31/2012  Documentation initiated by:  Rosemary Holms  Subjective/Objective Assessment:   Pt admitted from [facility]. To be discharged today back to his home with Hospice. Referral made and daughter has talked to Hospice.     Action/Plan:   Anticipated DC Date:  08/31/2012   Anticipated DC Plan:  HOME W HOSPICE CARE  In-house referral  Clinical Social Worker      DC Planning Services  CM consult      Choice offered to / List presented to:             Status of service:  Completed, signed off Medicare Important Message given?   (If response is "NO", the following Medicare IM given date fields will be blank) Date Medicare IM given:   Date Additional Medicare IM given:    Discharge Disposition:  HOME W HOSPICE CARE  Per UR Regulation:    If discussed at Long Length of Stay Meetings, dates discussed:    Comments:  08/31/12 Rosemary Holms RN BSN CM

## 2012-08-31 NOTE — Clinical Social Work Note (Signed)
Following discussion with MD, pt's daughter has decided to take pt home with hospice. CM made referral. Pt to d/c today. At first they were requesting EMS, but pt's daughter feels they can manage with her son's assistance. Given contact information for [facility] for future appointments at daughter's request. CSW signing off.  [name], [place] [phone]

## 2012-08-31 NOTE — Discharge Summary (Signed)
Physician Discharge Summary  [name] ZOX:[id number] DOB: 07/03/1921 DOA: 08/27/2012  PCP: [name], MD  Admit date: 08/27/2012 Discharge date: 08/31/2012  Time spent: Greater than 30 minutes  Recommendations for Outpatient Follow-up:  1. Home with hospice services.   Discharge Diagnoses:  1. Hypotension, resolved. No clear source. 2. Chronic atrial fibrillation, rate controlled. No candidate for anticoagulation in view of previous retroperitoneal hematoma. 3. History of systolic and diastolic congestive heart failure. Moderate aortic stenosis. 4. Severe COPD, oxygen dependent with rapid decompensation. 5. Palliative performance score of 30% at best. Hospice eligible with diagnosis of COPD.   Discharge Condition: Stable.  Diet recommendation: Regular.  Filed Weights   08/27/12 1737 08/28/12 0500 08/31/12 0500  Weight: 104.327 kg (230 lb) 101.8 kg (224 lb 6.9 oz) 97.342 kg (214 lb 9.6 oz)    History of present illness:  This 79 year old man presented to the hospital with symptoms of hypotension noticed at the skilled nursing facility. Please see initial history as outlined below: HPI: [name] is a 79 y.o. male with history of combined systolic and diastolic heart failure, chronic respiratory failure on home oxygen, COPD, afib, aortic stenosis who presents to the Emergency Department from the [facility] with hypotension. Per [facility] staff, patient has been more lethargic than baseline. Of note , patient discharged from [facility] 2/27 after 8 day stay for acute respiratory failure secondary to decompensated HF. Information obtained from chart and patient. His last BP reading at [facility] prior to arrival was 72/42 with O2 sat of 88% on L and HR 88. He denies shortness of breath, abdominal pain, chest pain, back pain, vomiting or fever. He denies dizziness. He does report "hurt all over". Work up in ED yields SBP in 70's, temp 100.5, white count 27.6  with greater than 20% bands. chest xray with possible slight improvement in pna. In Ed given fluids and started on zosyn and vancomycin. Patient has an extensive cardiac history which includes: MVR, congestive heart failure, ASCVD, atrial fibrillation, NSTEMI, aortic stenosis and CABG. Other medical history includes hypertension, hyperlipidemia, prostate cancer and diabetes. On exam pt lethargic but responsive, skin cool/dry/greyish. Symptoms came on suddenly, have persisted, are characterized as serious. We are asked to admit.   Hospital Course:  Patient was admitted to the step down unit and given aggressive intravenous fluids. He made good and steady improvement with this. He was felt to be in septic shock and he was initially given intravenous antibiotics. However blood cultures and urine cultures have been negative. Chest x-ray did not really show a new pneumonia. He appears to improve and appears to be back to his baseline. In view of his overall medical problems, combined they are very debilitating and he has a poor prognosis, I had a discussion of goals of care with the patient's daughter. She agreed that patient should be a DO NOT RESUSCITATE and she is also agreed to receive hospice services. I think the main diagnosis for hospice should be COPD. The patient is now stable to be discharged home with hospice services.  Procedures:  None.   Consultations:  None.  Discharge Exam: Filed Vitals:   08/30/12 1952 08/30/12 2040 08/31/12 0500 08/31/12 0710  BP:  103/57 132/79   Pulse:  96 86   Temp:  97.5 F (36.4 C) 97.2 F (36.2 C)   TempSrc:  Oral Oral   Resp:  20 20   Height:      Weight:   97.342  kg (214 lb 9.6 oz)   SpO2: 95% 97% 97% 94%    General: He looks chronically sick but currently not acutely sick. Cardiovascular: Heart sounds are present and in atrial fibrillation. Rate is controlled. Respiratory: Lung fields show bilateral wheezing which is his baseline. There are no  crackles or bronchial breathing. He is to be alert at this time. There are no focal neurological signs.  Discharge Instructions  Discharge Orders   Future Appointments [facility] Department Dept Phone   10/19/2012 11:30 AM [facility] [place] [facility] AT [facility] [phone]   Patient to arrive 15 minutes prior to appointment time.   10/19/2012 12:00 PM [facility] [place] [facility] AT [facility] [phone]   10/19/2012 1:00 PM Nada Libman, MD [facility] [phone]   Future Orders Complete By Expires     Diet - low sodium heart healthy  As directed     Increase activity slowly  As directed         Medication List    STOP taking these medications       amoxicillin-clavulanate 875-125 MG per tablet  Commonly known as:  AUGMENTIN     carvedilol 6.25 MG tablet  Commonly known as:  COREG     spironolactone 25 MG tablet  Commonly known as:  ALDACTONE      TAKE these medications       acetaminophen 325 MG tablet  Commonly known as:  TYLENOL  Take 650 mg by mouth every 4 (four) hours as needed for pain.     albuterol (2.5 MG/3ML) 0.083% nebulizer solution  Commonly known as:  PROVENTIL  Take 3 mLs (2.5 mg total) by nebulization every 6 (six) hours as needed. Mixes with the Ipratropium(for shortness of breath)     atorvastatin 10 MG tablet  Commonly known as:  LIPITOR  Take 1 tablet (10 mg total) by mouth daily at 6 PM.     benzonatate 100 MG capsule  Commonly known as:  TESSALON  Take 100 mg by mouth every 6 (six) hours as needed for cough.     digoxin 0.125 MG tablet  Commonly known as:  LANOXIN  Take 1 tablet (0.125 mg total) by mouth daily.     Fluticasone-Salmeterol 100-50 MCG/DOSE Aepb  Commonly known as:  ADVAIR  Inhale 1 puff into the lungs every 12 (twelve) hours.     furosemide 20 MG tablet  Commonly known as:  LASIX  Take 1 tablet (20 mg total) by mouth 2 (two) times daily.     guaiFENesin  600 MG 12 hr tablet  Commonly known as:  MUCINEX  Take 600 mg by mouth 2 (two) times daily as needed. congestion     ipratropium 0.02 % nebulizer solution  Commonly known as:  ATROVENT  Take 500 mcg by nebulization every 6 (six) hours as needed. Shortness of breath     levofloxacin 500 MG tablet  Commonly known as:  LEVAQUIN  Take 1 tablet (500 mg total) by mouth daily.     lisinopril 2.5 MG tablet  Commonly known as:  PRINIVIL,ZESTRIL  Take 1 tablet (2.5 mg total) by mouth daily.     morphine CONCENTRATE 10 mg / 0.5 ml concentrated solution  Take 0.5 mLs (10 mg total) by mouth every 4 (four) hours as needed for pain.     multivitamin with minerals Tabs  Take 1 tablet by mouth daily.     nystatin 100000 UNIT/ML suspension  Commonly known as:  MYCOSTATIN  Take 500,000 Units by mouth 4 (four) times daily.     omeprazole 40 MG capsule  Commonly known as:  PRILOSEC  Take 40 mg by mouth every morning.     oxymetazoline 0.05 % nasal spray  Commonly known as:  AFRIN  Place 1 spray into the nose daily as needed. Congestion     potassium chloride SA 20 MEQ tablet  Commonly known as:  K-DUR,KLOR-CON  Take 20 mEq by mouth 2 (two) times daily.     tamsulosin 0.4 MG Caps  Commonly known as:  FLOMAX  Take 0.4 mg by mouth every morning.          The results of significant diagnostics from this hospitalization (including imaging, microbiology, ancillary and laboratory) are listed below for reference.    Significant Diagnostic Studies: Dg Hip Complete Left  08/24/2012  *RADIOLOGY REPORT*  Clinical Data: Left hip pain  LEFT HIP - COMPLETE 2+ VIEW  Comparison: None.  Findings: Three views of the left hip submitted.  No acute fracture or subluxation.  There is diffuse osteopenia.  Mild degenerative changes bilateral hip joints.  A stent is noted than left femoral artery.  IMPRESSION: Diffuse osteopenia.  No acute fracture or subluxation.  Mild degenerative changes bilateral hip joints.    Original Report Authenticated By: Natasha Mead, M.D.    Dg Chest Port 1 View  08/27/2012  *RADIOLOGY REPORT*  Clinical Data: Hypotension.  Hypoxia.  PORTABLE CHEST - 1 VIEW  Comparison: 08/19/2012  Findings: Cardiomegaly and mild vascular congestion. Prior CABG. Prominent markings at the right base are redemonstrated consistent with pneumonia.  There may be slight improvement compared with prior film.  Small bilateral effusions are present.  IMPRESSION: Prominent markings right base consistent with pneumonia appears slightly improved compared with priors.  Cardiomegaly.   Original Report Authenticated By: Davonna Belling, M.D.    Dg Chest Port 1 View  08/19/2012  *RADIOLOGY REPORT*  Clinical Data: Possible infection, CHF, atrial fibrillation, prostate carcinoma  PORTABLE CHEST - 1 VIEW  Comparison: Portable chest x-ray of 08/14/2012  Findings: There do appear to be somewhat prominent markings at the right lung base, and pneumonia is a consideration.  No effusion is seen.  Moderate cardiomegaly is stable.  Median sternotomy sutures are noted.  IMPRESSION: Prominent markings at the right lung base may represent pneumonia. Stable moderate cardiomegaly.   Original Report Authenticated By: Dwyane Dee, M.D.    Dg Chest Port 1 View  08/14/2012  *RADIOLOGY REPORT*  Clinical Data: Respiratory failure and COPD.  Diastolic dysfunction  PORTABLE CHEST - 1 VIEW  Comparison: 08/12/2012  Findings: Cardiac enlargement.  Pulmonary vascular congestion and mild edema has improved since the prior study.  There is mild atelectasis in the lung bases.  No significant effusion however of the right lung base has been clipped on the image.  IMPRESSION: Mild improvement in pulmonary vascular congestion and fluid overload.   Original Report Authenticated By: Janeece Riggers, M.D.    Dg Chest Portable 1 View  08/12/2012  *RADIOLOGY REPORT*  Clinical Data: Shortness of breath.  PORTABLE CHEST - 1 VIEW  Comparison: 08/01/2012.  Findings: The  heart is enlarged but stable.  Prominent mediastinal and hilar contours are unchanged.  Persistent vascular congestion and chronic scarring changes.  No definite infiltrates, effusions or edema.  IMPRESSION:  Stable cardiac enlargement with mild vascular congestion but no overt pulmonary edema.   Original Report Authenticated By: Rudie Meyer, M.D.  Dg Chest Port 1 View  08/01/2012  *RADIOLOGY REPORT*  Clinical Data: 79 year old male congestive heart failure, shortness of breath.  PORTABLE CHEST - 1 VIEW  Comparison: 07/31/2012 and earlier.  Findings: Portable upright AP view 1116 hours. Stable cardiomegaly and mediastinal contours.  Interval decreased bilateral pleural effusions and improved bibasilar ventilation.  Stable vascularity, no overt edema.  No pneumothorax.  Retrocardiac density is stable, favor related to chronic atelectasis.  IMPRESSION: Improved bibasilar ventilation with decreased pleural effusions. No acute edema.  Chronic retrocardiac atelectasis.   Original Report Authenticated By: Erskine Speed, M.D.     Microbiology: Recent Results (from the past 240 hour(s))  URINE CULTURE     Status: None   Collection Time    08/27/12  2:59 PM      Result Value Range Status   Specimen Description URINE, CLEAN CATCH   Final   Special Requests NONE   Final   Culture  Setup Time 08/28/2012 01:06   Final   Colony Count NO GROWTH   Final   Culture NO GROWTH   Final   Report Status 08/28/2012 FINAL   Final  MRSA PCR SCREENING     Status: None   Collection Time    08/27/12  7:17 PM      Result Value Range Status   MRSA by PCR NEGATIVE  NEGATIVE Final   Comment:            The GeneXpert MRSA Assay (FDA     approved for NASAL specimens     only), is one component of a     comprehensive MRSA colonization     surveillance program. It is not     intended to diagnose MRSA     infection nor to guide or     monitor treatment for     MRSA infections.     Labs: Basic Metabolic  Panel:  Recent Labs Lab 08/27/12 1348 08/28/12 0439 08/29/12 0435 08/30/12 0541 08/31/12 0437  NA 139 140 138 140 141  K 4.9 4.5 3.4* 3.0* 3.5  CL 95* 98 98 101 103  CO2 38* 37* 35* 31 32  GLUCOSE 121* 103* 101* 109* 99  BUN 27* 26* 23 22 16   CREATININE 1.08 0.93 0.68 0.69 0.69  CALCIUM 8.9 8.5 8.6 8.7 8.6   Liver Function Tests:  Recent Labs Lab 08/29/12 0435  AST 18  ALT 23  ALKPHOS 62  BILITOT 0.6  PROT 5.6*  ALBUMIN 2.9*     CBC:  Recent Labs Lab 08/27/12 1348 08/28/12 0439 08/29/12 0435 08/31/12 0437  WBC 27.6* 16.3* 10.4 8.2  NEUTROABS 25.1* 14.5*  --   --   HGB 13.2 11.8* 11.3* 11.4*  HCT 41.4 36.5* 34.6* 35.1*  MCV 101.5* 100.0 98.9 100.0  PLT 135* 117* 115* 114*   Cardiac Enzymes:  Recent Labs Lab 08/27/12 1348  TROPONINI <0.30   BNP: BNP (last 3 results)  Recent Labs  08/12/12 1755 08/17/12 0441 08/27/12 1348  PROBNP 5479.0* 6458.0* 8686.0*   CBG:  Recent Labs Lab 08/29/12 1619 08/29/12 2054 08/30/12 0823 08/30/12 1659 08/31/12 0728  GLUCAP 190* 212* 101* 231* 91       Signed:  GOSRANI,NIMISH C  [facility] 08/31/2012, 9:49 AM

## 2012-09-01 LAB — CULTURE, BLOOD (ROUTINE X 2)

## 2012-09-03 ENCOUNTER — Other Ambulatory Visit: Payer: Self-pay | Admitting: Family Medicine

## 2012-09-03 ENCOUNTER — Telehealth: Payer: Self-pay

## 2012-09-03 NOTE — Telephone Encounter (Signed)
Pt was just admitted and he states he has been having diarrhea since released from hospital. Raiford Noble advised to stop levaquin and wanted a verbal order for immodium. It is on his standing orders but since he was just admitted they haven't gotten them back yet [phone]

## 2012-09-03 NOTE — Telephone Encounter (Signed)
[name] coming by to get container for cdiff and gave order for the immodium

## 2012-09-03 NOTE — Telephone Encounter (Signed)
C diff needs to be checked, please have them obtain a sample Imodium 1 tablet TID prn for diarrhea

## 2012-09-04 ENCOUNTER — Telehealth: Payer: Self-pay

## 2012-09-04 ENCOUNTER — Other Ambulatory Visit: Payer: Self-pay

## 2012-09-04 LAB — CLOSTRIDIUM DIFFICILE EIA: CDIFTX: POSITIVE

## 2012-09-04 MED ORDER — DIGOXIN 125 MCG PO TABS: 0.1250 mg | ORAL_TABLET | Freq: Every day | ORAL | Status: DC

## 2012-09-04 MED ORDER — METRONIDAZOLE 500 MG PO TABS: 500.0000 mg | ORAL_TABLET | Freq: Three times a day (TID) | ORAL | Status: DC

## 2012-09-04 NOTE — Addendum Note (Signed)
Addended by: Milinda Antis F on: 09/04/2012 09:22 AM   Modules accepted: Orders

## 2012-09-04 NOTE — Telephone Encounter (Signed)
Daughter states Dr Lendell Caprice had put him on cardizem 120 QID. He was even being given this in the hospital. NOW she is out of the cardizem and she is wanting it refilled but hospice is telling her that it isn't on his list of meds to be taking from the hospital. Only digoxin is listed but she states she doesn't have a rx for that. Since it was on his list to be taking I refilled it today but she was wanting to know if it was supposed to be taking the place of cardizem or if he should still be on that one too. Please advise. Daughter is very stressed about this

## 2012-09-04 NOTE — Progress Notes (Signed)
Called [name] at [facility] to tell him to stop the immodium and start flagyl and NOT to [place] the nystatin wash

## 2012-09-04 NOTE — Telephone Encounter (Signed)
He should not be on cardizem because his blood pressures were too low. He should take the digoxin, please refill for her

## 2012-09-04 NOTE — Telephone Encounter (Signed)
Called patient and left message for them to return call at the office   

## 2012-09-04 NOTE — Progress Notes (Signed)
Shon Hale called back for [name] and was given the instructions and she was going to call [name] and explain also

## 2012-09-07 ENCOUNTER — Ambulatory Visit: Payer: Medicare Other | Admitting: Family Medicine

## 2012-09-08 ENCOUNTER — Ambulatory Visit: Payer: Medicare Other | Admitting: Family Medicine

## 2012-09-08 ENCOUNTER — Other Ambulatory Visit: Payer: Self-pay | Admitting: Family Medicine

## 2012-09-09 NOTE — Telephone Encounter (Signed)
Noted I spoke to daughter and pt yesterday

## 2012-09-10 ENCOUNTER — Ambulatory Visit (INDEPENDENT_AMBULATORY_CARE_PROVIDER_SITE_OTHER): Payer: Medicare Other | Admitting: Family Medicine

## 2012-09-10 ENCOUNTER — Encounter: Payer: Self-pay | Admitting: Family Medicine

## 2012-09-10 VITALS — BP 112/74 | HR 62 | Resp 16 | Wt 224.0 lb

## 2012-09-10 DIAGNOSIS — I5032 Chronic diastolic (congestive) heart failure: Secondary | ICD-10-CM

## 2012-09-10 DIAGNOSIS — D509 Iron deficiency anemia, unspecified: Secondary | ICD-10-CM

## 2012-09-10 DIAGNOSIS — I509 Heart failure, unspecified: Secondary | ICD-10-CM

## 2012-09-10 DIAGNOSIS — E669 Obesity, unspecified: Secondary | ICD-10-CM

## 2012-09-10 DIAGNOSIS — E739 Lactose intolerance, unspecified: Secondary | ICD-10-CM

## 2012-09-10 DIAGNOSIS — A0472 Enterocolitis due to Clostridium difficile, not specified as recurrent: Secondary | ICD-10-CM

## 2012-09-10 DIAGNOSIS — M1710 Unilateral primary osteoarthritis, unspecified knee: Secondary | ICD-10-CM

## 2012-09-10 DIAGNOSIS — J9610 Chronic respiratory failure, unspecified whether with hypoxia or hypercapnia: Secondary | ICD-10-CM

## 2012-09-10 DIAGNOSIS — E785 Hyperlipidemia, unspecified: Secondary | ICD-10-CM

## 2012-09-10 NOTE — Patient Instructions (Addendum)
F/u in 3 month, please call if you need me before.  I will contact your daughter after I speak with hospice.  Fasting lipid, cmp and CBc in 3 month  Keep abrasion on right forearm loosely covered please till fully healed  Take 1 multivitamin daily  Letter for [employer] today

## 2012-09-11 ENCOUNTER — Other Ambulatory Visit: Payer: Self-pay

## 2012-09-11 MED ORDER — TAMSULOSIN HCL 0.4 MG PO CAPS: 0.4000 mg | ORAL_CAPSULE | Freq: Every morning | ORAL | Status: DC

## 2012-09-13 DIAGNOSIS — A0472 Enterocolitis due to Clostridium difficile, not specified as recurrent: Secondary | ICD-10-CM | POA: Insufficient documentation

## 2012-09-13 NOTE — Assessment & Plan Note (Signed)
Currently, no decompensation

## 2012-09-13 NOTE — Progress Notes (Signed)
  Subjective:    Patient ID: [name], male    DOB: 1921/12/07, 79 y.o.   MRN: [id number]  HPI Pt in for f/u after recent hospitalization from 3/6 to 3/10 for acute sepsis, only clear dx is c diff colitis. He was transferred from the [facility] center where he was being rehabiltated hypotensive with leukocytosis and a left shift.During this hospitalization , pt was made DNR and placed in hospice. Daughter in today, stating she understands her father will not be here forever, and that he has been hospitalized recurrently wit multiple co morbidities, however states only reason she agreed to hospice was financial as she could not afford to have him at [facility] center,now she is unhappy with the fact that IV iron access may be denied , and states the oxygen he receives can be covered out of pocket. Clearly I will need another consultation with her individually after review of entire situation. Based on multiple co morbidities and recurrent hospitalizations in the past year hospice and a DNR status is the most reasonable option for [name] who has defied the odds on multiple occasions. She is comfortable with forgoing vascular f/u of an aneurysm, she accepts that his operative risks outweigh potential benefit. Has questions regarding current meds , which are addressed, and pt again is requesting imodium , which is not an option with the c diff, stool frequency is 4 per day, but rectal area" raw" Appetite reportedly good, breathing comfortably with supplemental oxygen at 4 liters    Review of Systems See HPI Denies recent fever or chills. Denies sinus pressure, nasal congestion, ear pain or sore throat. Denies chest congestion, productive cough or wheezing. Denies chest pains, palpitations and leg swelling   Denies dysuria, frequency, hesitancy or incontinence. Limitation in mobility due to severe arthritis Denies headaches, seizures, numbness, or tingling. Denies depression, anxiety or  insomnia. Skin breakdown on right forearm, superficial , healing , reports rawness in rectal area due to diarrhea, not feasible to examine , but will request hospice nurse to check on area      Objective:   Physical Exam Patient alert  and in no cardiopulmonary distress on 4 liters oxygen  HEENT: No facial asymmetry, EOMI, no sinus tenderness,  oropharynx pink and moist.  Neck supple no adenopathy.  Chest: Clear to auscultation bilaterally.decreased air entry throughout, no crackles oor wheezes  CVS: S1, S2 systolic murmur   ABD: Soft non tender. Bowel sounds slightly hyperactive  Ext: No edema  MS: decreased  ROM spine, shoulders, hips and knees.Ambulates with a walker at home, wheelchair dependent outside of the home  Skin:superfiial ulceration right forearm partially healed, needs loose dry gauze for protection  Psych: Good eye contact, normal affect. not anxious or depressed appearing.  CNS: CN 2-12 intact, power, tone  normal throughout.        Assessment & Plan:

## 2012-09-13 NOTE — Assessment & Plan Note (Signed)
Hb currently stable , no need for supplemental iron iv at this time. Will consider starting oral iron daily since now hosice and acces to parenteral iron my be limited. Will discuss with Dr Mariel Sleet

## 2012-09-13 NOTE — Assessment & Plan Note (Signed)
Recent hospitalization for 10 days for acute respiratory decompensation,per hospitalist, pt's respiratory status is poor enough with recurrent hospitalizations in the past year with acute failure, that he is hospice eligible based on this history.I agree with this Today at visit  daughter appears uncomfortable with the decision to make father DNR as she is now being told he will no longer recieve iV iron, if needed, currently does not .states the reason she agreed to hospice was because she could not afford to put him back in [address] center, and that the cost of the oxygen which is now being covered, she can afford. Will have to follow up on this situation more.Specificaly addressing access to iv iron while in hospice Currently patient is comfortable on 4 liters oxygen She is comfortable with forfeiting further vascular f/u of aneurysm, understands and accepts risk  Of surgery outweigh benefit potentially Pt is at the stage where he agrees with whatever his daughter states

## 2012-09-13 NOTE — Assessment & Plan Note (Signed)
Reports onavg 4 loose stool dialy, requests immodium, again explained this is not an option with his current dx. Needs to complete flagyll course

## 2012-09-13 NOTE — Assessment & Plan Note (Signed)
Controlled, no change in medication  

## 2012-09-13 NOTE — Assessment & Plan Note (Signed)
Severe arthritis with limitation in mobility, using walker at home , wheelchair dependent outside of the home. Reportedly was not allowed to ambulate even with assistance in the most recent hospitalization

## 2012-09-16 ENCOUNTER — Telehealth: Payer: Self-pay | Admitting: Family Medicine

## 2012-09-16 NOTE — Telephone Encounter (Signed)
Please advise 

## 2012-09-16 NOTE — Telephone Encounter (Signed)
noted 

## 2012-09-28 ENCOUNTER — Other Ambulatory Visit: Payer: Self-pay | Admitting: Family Medicine

## 2012-09-28 ENCOUNTER — Ambulatory Visit: Payer: Medicare Other | Admitting: Surgery

## 2012-09-28 ENCOUNTER — Other Ambulatory Visit: Payer: Medicare Other

## 2012-09-28 ENCOUNTER — Telehealth: Payer: Self-pay

## 2012-09-28 DIAGNOSIS — R197 Diarrhea, unspecified: Secondary | ICD-10-CM

## 2012-09-28 DIAGNOSIS — A0472 Enterocolitis due to Clostridium difficile, not specified as recurrent: Secondary | ICD-10-CM

## 2012-09-28 MED ORDER — METRONIDAZOLE 500 MG PO TABS: 500.0000 mg | ORAL_TABLET | Freq: Three times a day (TID) | ORAL | Status: AC

## 2012-09-28 NOTE — Telephone Encounter (Signed)
flagyll sent in for an addittional 10 days. I am also referring him to GI for evaluation for ongoing diarrheah with Cdiff, pls let Raiford Noble and pt know

## 2012-09-28 NOTE — Telephone Encounter (Signed)
Daughter aware to go collect flagyl and that he will be referred to GI

## 2012-09-28 NOTE — Telephone Encounter (Signed)
Per Hospice nurse- Pt still having diarrhea. Has taken up the flagyl for c-diff but still having diarrhea, though not as severe as before its still enough to keep him from wanting to eat because it just goes right through him. Wants to know what you want to do.

## 2012-10-06 ENCOUNTER — Other Ambulatory Visit: Payer: Self-pay | Admitting: Family Medicine

## 2012-10-13 ENCOUNTER — Ambulatory Visit: Payer: Medicare Other | Admitting: Internal Medicine

## 2012-10-19 ENCOUNTER — Other Ambulatory Visit: Payer: Medicare Other

## 2012-10-19 ENCOUNTER — Ambulatory Visit: Payer: Medicare Other | Admitting: Surgery

## 2012-10-22 ENCOUNTER — Other Ambulatory Visit: Payer: Self-pay | Admitting: Family Medicine

## 2012-10-26 ENCOUNTER — Telehealth: Payer: Self-pay | Admitting: Family Medicine

## 2012-10-26 ENCOUNTER — Other Ambulatory Visit: Payer: Medicare Other

## 2012-10-26 ENCOUNTER — Ambulatory Visit: Payer: Medicare Other | Admitting: Surgery

## 2012-10-26 ENCOUNTER — Other Ambulatory Visit: Payer: Self-pay

## 2012-10-26 MED ORDER — LOVASTATIN 40 MG PO TABS: ORAL_TABLET | ORAL | Status: DC

## 2012-10-26 NOTE — Telephone Encounter (Signed)
Med refilled denoting hospice pt.

## 2012-10-29 ENCOUNTER — Other Ambulatory Visit: Payer: Self-pay | Admitting: Family Medicine

## 2012-11-05 ENCOUNTER — Ambulatory Visit: Payer: Medicare Other | Admitting: Gastroenterology

## 2012-11-13 ENCOUNTER — Telehealth: Payer: Self-pay

## 2012-11-13 NOTE — Telephone Encounter (Signed)
Opened in error

## 2012-11-17 ENCOUNTER — Other Ambulatory Visit: Payer: Self-pay | Admitting: Family Medicine

## 2012-11-27 ENCOUNTER — Telehealth: Payer: Self-pay | Admitting: Family Medicine

## 2012-11-27 NOTE — Telephone Encounter (Signed)
noted 

## 2012-11-27 NOTE — Telephone Encounter (Signed)
Noted  

## 2012-11-30 ENCOUNTER — Telehealth: Payer: Self-pay | Admitting: Family Medicine

## 2012-11-30 NOTE — Telephone Encounter (Signed)
Noted, 2nd time

## 2012-12-14 ENCOUNTER — Other Ambulatory Visit: Payer: Self-pay | Admitting: Family Medicine

## 2012-12-17 LAB — LIPID PANEL
HDL: 46 mg/dL (ref >39)
LDL Cholesterol: 65 mg/dL (ref 0–99)
Total CHOL/HDL Ratio: 2.8 Ratio
Triglycerides: 82 mg/dL (ref <150)
VLDL: 16 mg/dL (ref 0–40)

## 2012-12-17 LAB — CBC WITH DIFFERENTIAL/PLATELET
Basophils Absolute: 0.1 10*3/uL (ref 0.0–0.1)
Eosinophils Relative: 3 % (ref 0–5)
HCT: 41.2 % (ref 39.0–52.0)
Hemoglobin: 13.8 g/dL (ref 13.0–17.0)
Lymphocytes Relative: 15 % (ref 12–46)
MCHC: 33.5 g/dL (ref 30.0–36.0)
MCV: 91.8 fL (ref 78.0–100.0)
Monocytes Absolute: 0.8 10*3/uL (ref 0.1–1.0)
Monocytes Relative: 12 % (ref 3–12)
RDW: 14.3 % (ref 11.5–15.5)

## 2012-12-17 LAB — COMPREHENSIVE METABOLIC PANEL
ALT: 18 U/L (ref 0–53)
AST: 23 U/L (ref 0–37)
Creat: 0.83 mg/dL (ref 0.50–1.35)
Total Bilirubin: 0.7 mg/dL (ref 0.3–1.2)

## 2012-12-23 ENCOUNTER — Ambulatory Visit (INDEPENDENT_AMBULATORY_CARE_PROVIDER_SITE_OTHER): Payer: Medicare Other | Admitting: Family Medicine

## 2012-12-23 ENCOUNTER — Encounter: Payer: Self-pay | Admitting: Family Medicine

## 2012-12-23 VITALS — BP 126/72 | HR 99 | Resp 18 | Ht 74.0 in | Wt 226.0 lb

## 2012-12-23 DIAGNOSIS — D509 Iron deficiency anemia, unspecified: Secondary | ICD-10-CM

## 2012-12-23 DIAGNOSIS — M1710 Unilateral primary osteoarthritis, unspecified knee: Secondary | ICD-10-CM

## 2012-12-23 DIAGNOSIS — J9610 Chronic respiratory failure, unspecified whether with hypoxia or hypercapnia: Secondary | ICD-10-CM

## 2012-12-23 DIAGNOSIS — E785 Hyperlipidemia, unspecified: Secondary | ICD-10-CM

## 2012-12-23 DIAGNOSIS — I5032 Chronic diastolic (congestive) heart failure: Secondary | ICD-10-CM

## 2012-12-23 DIAGNOSIS — I509 Heart failure, unspecified: Secondary | ICD-10-CM

## 2012-12-23 NOTE — Patient Instructions (Addendum)
F/u in October , call if you need me before   Labs are excellent , no change in meds   We will refer you for [facility] services, not sure if they will covered   Iron and ferritin will be added to recent labs

## 2012-12-24 ENCOUNTER — Ambulatory Visit: Payer: Medicare Other | Admitting: Family Medicine

## 2012-12-26 ENCOUNTER — Other Ambulatory Visit: Payer: Self-pay | Admitting: Family Medicine

## 2013-01-04 ENCOUNTER — Other Ambulatory Visit: Payer: Self-pay | Admitting: Family Medicine

## 2013-01-11 NOTE — Assessment & Plan Note (Signed)
Stable and controlled on current meds, no changes at this time \\Pt  reminded to eat low sodium diet and restrict salt

## 2013-01-11 NOTE — Assessment & Plan Note (Signed)
Continues to deteriorate, safety stressed, pt has had no falls, uses assistive device or wheelchair at all times

## 2013-01-11 NOTE — Progress Notes (Signed)
  Subjective:    Patient ID: [name], male    DOB: 22-Jul-1921, 79 y.o.   MRN: [id number]  HPI Pt in for f/u, since last visit he was hospitalized from 3/6 to 3/10, and d/c to home hospice due to severe COPD, poor performance status and CHF. Pt's condition has improved to the extent that he is no longer a hospice pt, and is generally doing well, back to his pre admit recent baseline Obviously his chronic debilitating conditions  Remain, but currently doing very well No recent rectal blood , visible, appetite is good, c dif treated and bM back to normal    Review of Systems    See HPI Denies recent fever or chills. Denies sinus pressure, nasal congestion, ear pain or sore throat. Denies chest congestion, productive cough or wheezing.Chronic shortness of breath with minimal activity, and pt is oxygen dependent Denies chest pains, palpitations and leg swelling Denies abdominal pain, nausea, vomiting,diarrhea or constipation.   Denies dysuria, frequency, hesitancy or incontinence. Chronic joint pain, swelling and limitation in mobility.Ambulates with a walker Denies headaches, seizures, numbness, or tingling. Denies depression, anxiety or insomnia. Denies skin break down or rash.     Objective:   Physical Exam  Patient alert and oriented and in no cardiopulmonary distress.  HEENT: No facial asymmetry, EOMI, no sinus tenderness,  oropharynx pink and moist.  Neck decreased though adequate ROM, no adenopathy.no JVD  Chest: Clear to auscultation bilaterally.dectreased air entry throughout  CVS: S1, S2 no murmurs, no S3.  ABD: Soft non tender. Bowel sounds normal.  Ext: No edema  MS: decreased  ROM spine, shoulders, hips and knees.  Skin: Intact, no ulcerations or rash noted.  Psych: Good eye contact, normal affect. Memory impaired not anxious or depressed appearing.  CNS: CN 2-12 intact, power, tone and sensation normal throughout.       Assessment & Plan:

## 2013-01-11 NOTE — Assessment & Plan Note (Signed)
Currently stable, pt to continue on supplemental oxygen

## 2013-01-11 NOTE — Assessment & Plan Note (Signed)
Controlled, no change in medication Hyperlipidemia:Low fat diet discussed and encouraged.  \ 

## 2013-01-28 ENCOUNTER — Other Ambulatory Visit: Payer: Self-pay | Admitting: Family Medicine

## 2013-02-03 ENCOUNTER — Telehealth: Payer: Self-pay | Admitting: Family Medicine

## 2013-02-03 DIAGNOSIS — D539 Nutritional anemia, unspecified: Secondary | ICD-10-CM

## 2013-02-03 DIAGNOSIS — R7301 Impaired fasting glucose: Secondary | ICD-10-CM

## 2013-02-03 NOTE — Telephone Encounter (Signed)
pls order HB, iron, ferritin, HBA1C does have a dg of IGT, all are non fasting

## 2013-02-03 NOTE — Telephone Encounter (Signed)
Does he need labs ordered before his appt to check iron?

## 2013-02-04 LAB — IRON: Iron: 104 ug/dL (ref 42–165)

## 2013-02-04 LAB — HEMOGLOBIN A1C: Mean Plasma Glucose: 120 mg/dL — ABNORMAL HIGH (ref <117)

## 2013-02-04 NOTE — Telephone Encounter (Signed)
[name] aware

## 2013-02-05 ENCOUNTER — Encounter: Payer: Self-pay | Admitting: Family Medicine

## 2013-02-05 ENCOUNTER — Ambulatory Visit: Payer: Medicare Other | Admitting: Family Medicine

## 2013-02-08 ENCOUNTER — Other Ambulatory Visit: Payer: Self-pay | Admitting: Family Medicine

## 2013-02-23 ENCOUNTER — Other Ambulatory Visit: Payer: Self-pay | Admitting: Family Medicine

## 2013-02-25 ENCOUNTER — Encounter (HOSPITAL_COMMUNITY): Payer: Medicare Other | Attending: Oncology

## 2013-02-25 DIAGNOSIS — D509 Iron deficiency anemia, unspecified: Secondary | ICD-10-CM | POA: Insufficient documentation

## 2013-02-25 LAB — CBC
MCV: 98.9 fL (ref 78.0–100.0)
Platelets: 144 10*3/uL — ABNORMAL LOW (ref 150–400)
RBC: 4.63 MIL/uL (ref 4.22–5.81)
RDW: 14.8 % (ref 11.5–15.5)
WBC: 6.2 10*3/uL (ref 4.0–10.5)

## 2013-02-25 LAB — DIFFERENTIAL
Basophils Absolute: 0.1 10*3/uL (ref 0.0–0.1)
Eosinophils Relative: 3 % (ref 0–5)
Lymphocytes Relative: 13 % (ref 12–46)
Lymphs Abs: 0.8 10*3/uL (ref 0.7–4.0)
Neutro Abs: 4.3 10*3/uL (ref 1.7–7.7)
Neutrophils Relative %: 70 % (ref 43–77)

## 2013-02-25 NOTE — Progress Notes (Signed)
Labs drawn today for cbc/diff,ferr,Iron and IBC 

## 2013-02-26 LAB — IRON AND TIBC
Iron: 125 ug/dL (ref 42–135)
Saturation Ratios: 44 % (ref 20–55)
TIBC: 285 ug/dL (ref 215–435)
UIBC: 160 ug/dL (ref 125–400)

## 2013-02-26 LAB — FERRITIN: Ferritin: 60 ng/mL (ref 22–322)

## 2013-03-01 ENCOUNTER — Other Ambulatory Visit: Payer: Self-pay | Admitting: Family Medicine

## 2013-03-01 ENCOUNTER — Ambulatory Visit (HOSPITAL_COMMUNITY): Payer: Medicare Other | Admitting: Oncology

## 2013-03-05 ENCOUNTER — Encounter (HOSPITAL_COMMUNITY): Payer: Self-pay | Admitting: Oncology

## 2013-03-05 ENCOUNTER — Encounter (HOSPITAL_BASED_OUTPATIENT_CLINIC_OR_DEPARTMENT_OTHER): Payer: Medicare Other | Admitting: Oncology

## 2013-03-05 VITALS — BP 104/67 | HR 77 | Temp 97.0°F | Resp 16 | Wt 234.1 lb

## 2013-03-05 DIAGNOSIS — F329 Major depressive disorder, single episode, unspecified: Secondary | ICD-10-CM

## 2013-03-05 DIAGNOSIS — K909 Intestinal malabsorption, unspecified: Secondary | ICD-10-CM

## 2013-03-05 DIAGNOSIS — D509 Iron deficiency anemia, unspecified: Secondary | ICD-10-CM

## 2013-03-05 DIAGNOSIS — R627 Adult failure to thrive: Secondary | ICD-10-CM

## 2013-03-05 NOTE — Progress Notes (Signed)
[name], MD [address], [address] [place] [address]  Anemia, iron deficiency - Plan: CBC with Differential, Iron and TIBC, Ferritin  CURRENT THERAPY: Feraheme 1020 mg PRN last given on 07/10/2012  INTERVAL HISTORY: AHMOD GILLESPIE 79 y.o. male returns for  regular  visit for followup of iron deficiency anemia requiring IV support secondary to poor absorption but more importantly GI blood losses.   Ford is not on his Aspirin or Plavix. This was discontinued prior to his Colonoscopy and EGD on 03/11/2012. He has cecal AVMs and 2/3 of these were clipped, but this is not to be considered definitive treatment. Antoino required 2 units PRBCs on 03/21/2012 when he was admitted to the [facility] with GI bleed secondary to cecal AVMs.  Unfortunately, Miguelangel has been hospitalized on multiple occassions for a number of his chronic diseases since we saw him last.  His last hospitalization was in March 2014 and he has been followed closely by his PCP.  At one point in time, the patient was placed on Hospice due to exacerbations of his medical problems, but about 12 weeks later, he was discharged from Hospice.   Unfortunately, his daughter, Juleen China, who was our patient with metastatic breast cancer passed away after being found to have brain metastases.  She reported to the ED with respiratory failure and intubated.  She was in ICU at [facility] for nearly 1-2 weeks and transferred to a long-term acute care unit in [place] where she subsequently passed away.     Eithan has a number of chronic issues that I will defer management to PCP.  Since his daughter's death, it appears as though he has an element of anhedonia.  I suspect he has an element of depression, but he denies during our conversation.  He does not sleep at night due to dreams about war time.  I will defer to PCP who he is seeing in 4 weeks or so.   I personally reviewed and went over laboratory results  with the patient.  His Hgb is WNL at 14.6 g/dl and unimpressive iron studies.  "last time he felt like this he needed iron."  With these lab results, there is no indication for IV feraheme.  Hematologically, he denies any complaints and ROS questioning is negative.   Past Medical History  Diagnosis Date  . Mitral valve prolapse 2003    MVR in 2003  . Obesity   . Osteoarthritis   . Hypertension   . Hyperlipidemia   . COPD (chronic obstructive pulmonary disease)   . CHF (congestive heart failure) diastolic 10/2010    CXR in 12/2010: Prior CABG, cardiomegaly, vascular redistribution, bibasilar atelectasis, small effusions  . Pneumonia   . Impaired glucose tolerance   . Atrial fibrillation, chronic   . Prostate cancer     elevated PSA  . GERD (gastroesophageal reflux disease)     + hiatal hernia  . Arteriosclerotic cardiovascular disease (ASCVD) 1994    stent to RCA; CABG-2003  . Hard of hearing   . Anemia, iron deficiency   . AAA (abdominal aortic aneurysm)     Fusiform; infrarenal; 4-4.1cm on CT in 07/2008 and 12/2010 by MRI  . Gastric ulcer 2004    2004; upper GI bleed  . Renal cysts, acquired, bilateral     Complex by MRI in 12/2010  . Adenomatous polyps 04/07/2012  . HYPERTENSION 12/17/2007    Subsequently hypotensive and all antihypertensive medication discontinued Lab  03/2012: Mild anemia with H&H-11.8/37.5, MCV-97, ferritin-224, normal CMet ex G-124, alb-3.4   . NSTEMI (non-ST elevated myocardial infarction) 01/18/2011  . Diabetes mellitus, type II 07/13/12    family denies patient is diabetic  . Macrocytosis without anemia 08/13/2012  . Acute systolic congestive heart failure 08/15/2012    EF 35-40%, per Echo  . Aortic stenosis, moderate 08/15/2012  . Pulmonary hypertension due to COPD 08/15/2012    53 mm Hg  . Tricuspid valve regurgitation 08/15/2012  . Retroperitoneal bleed 06/2012    has IMPAIRED GLUCOSE TOLERANCE; HYPERLIPIDEMIA; KNEE, ARTHRITIS, DEGEN./OSTEO; Peripheral  vascular disease; COPD exacerbation; Gait disturbance; Hearing loss; Anemia, iron deficiency; Chronic respiratory failure; Chronic diastolic CHF (congestive heart failure); Allergic rhinitis; Mitral valve prolapse; Obesity; Atrial fibrillation, chronic; Arteriosclerotic cardiovascular disease (ASCVD); GERD (gastroesophageal reflux disease); Prostate cancer; Aorta aneurysm; Routine general medical examination at a health care facility; Acute respiratory failure; Steroid-induced hyperglycemia; Abdominal pain; Acute on chronic diastolic congestive heart failure; CHF (congestive heart failure); Acute-on-chronic respiratory failure; Macrocytosis without anemia; Hyperglycemia, drug-induced; Acute respiratory failure with hypercapnia; Acute systolic congestive heart failure; Aortic stenosis, moderate; Pulmonary hypertension due to COPD; Tricuspid valve regurgitation; Acute on chronic systolic heart failure; Acute encephalopathy; HCAP (healthcare-associated pneumonia); SIRS (systemic inflammatory response syndrome); Heart failure, systolic and diastolic; Septic shock; and C. difficile colitis on his problem list.     is allergic to aspirin; gabapentin; and naproxen.  Mr. Matranga had no medications administered during this visit.  Past Surgical History  Procedure Laterality Date  . Knee surgery      Left  . Bladder surgery    . Femoral artery stent  12-06-10    Left SFA  . Appendectomy    . Mitral valve replacement (mvr)/coronary artery bypass grafting (cabg)  2003    stent to RCA in 1994  . Eye surgery  2007    bilateral cataracts  . Tonsillectomy      thinks they were removed while in the [employer]  . Mass excision  06/07/2011    Procedure: EXCISION MASS;  Surgeon: Fabio Bering;  Location: [facility];  Service: General;  Laterality: N/A;  excision of 2 masses back and buttocks  . Back surgery    . Esophagogastroduodenoscopy  03/03/2003    Large, deep prepyloric ulcer, as described above without bleeding/  Normal esophagus  . Esophagogastroduodenoscopy  09/03/2004    normal throughout  . Colonoscopy  09/03/2004    small ulcer, without stigmata of bleeding  . Esophagogastroduodenoscopy  11/14/2004    Normal esophagus and small hiatal hernia, otherwise normal stomach   . Colonoscopy  11/14/2004    Internal hemorrhoids, otherwise normal rectum/ left-sided diverticula , diffusely oozing right colon mucosa without a discrete lesion amenable to endoscopic therapy. 2 diminutive polyps. FELT TO HAVE AVMs/telangiectasias  . Colonoscopy  01/29/2005    Normal rectum  . Colonoscopy  03/11/2012    Colonic diverticulosis. Colonic polyps-removed as described above. Vascular anomalies in the cecum likely representing hemangiomas. Status post hemostasis clipping of  2 of the 3. ADENOMATOUS POLYPS. Repeat 2016  . Esophagogastroduodenoscopy  03/11/2012    Deformity of the antrum;  small polyp in antrum-not manipulated. Otherwise normal exam    Denies any headaches, dizziness, double vision, fevers, chills, night sweats, nausea, vomiting, diarrhea, constipation, chest pain, heart palpitations, shortness of breath, blood in stool, black tarry stool, urinary pain, urinary burning, urinary frequency, hematuria.   PHYSICAL EXAMINATION  ECOG PERFORMANCE STATUS: 3 - Symptomatic, >50% confined to bed  Filed Vitals:  03/05/13 1200  BP: 104/67  Pulse: 77  Temp: 97 F (36.1 C)  Resp: 16    GENERAL:alert, no distress, well nourished, well developed, comfortable, cooperative, smiling and pale, in wheelchair, chronically ill-appearing. SKIN: skin color, texture, turgor are normal, no rashes or significant lesions HEAD: Normocephalic, No masses, lesions, tenderness or abnormalities EYES: normal, PERRLA, EOMI, Conjunctiva are pink and non-injected EARS: External ears normal OROPHARYNX:mucous membranes are moist  NECK: supple, no adenopathy, thyroid normal size, non-tender, without nodularity, no stridor, non-tender,  trachea midline LYMPH:  no palpable lymphadenopathy BREAST:not examined LUNGS: clear to auscultation , decreased breath sounds HEART: regular rate & rhythm, no murmurs, no gallops, S1 normal and S2 normal ABDOMEN:abdomen soft, non-tender and normal bowel sounds BACK: Back symmetric, no curvature., No CVA tenderness EXTREMITIES:less then 2 second capillary refill, no joint deformities, effusion, or inflammation, no skin discoloration, no clubbing, no cyanosis, positive findings:  edema 1+ pitting edema, BL LE pre-tibially.  NEURO: alert & oriented x 3 with fluent speech, no focal motor/sensory deficits    LABORATORY DATA: CBC    Component Value Date/Time   WBC 6.2 02/25/2013 1156   RBC 4.63 02/25/2013 1156   RBC 4.43 07/08/2012 1022   HGB 14.6 02/25/2013 1156   HCT 45.8 02/25/2013 1156   PLT 144* 02/25/2013 1156   MCV 98.9 02/25/2013 1156   MCH 31.5 02/25/2013 1156   MCHC 31.9 02/25/2013 1156   RDW 14.8 02/25/2013 1156   LYMPHSABS 0.8 02/25/2013 1156   MONOABS 0.8 02/25/2013 1156   EOSABS 0.2 02/25/2013 1156   BASOSABS 0.1 02/25/2013 1156      Chemistry      Component Value Date/Time   NA 142 12/17/2012 1100   K 4.1 12/17/2012 1100   CL 107 12/17/2012 1100   CO2 27 12/17/2012 1100   BUN 16 12/17/2012 1100   CREATININE 0.83 12/17/2012 1100   CREATININE 0.69 08/31/2012 0437      Component Value Date/Time   CALCIUM 9.3 12/17/2012 1100   ALKPHOS 84 12/17/2012 1100   AST 23 12/17/2012 1100   ALT 18 12/17/2012 1100   BILITOT 0.7 12/17/2012 1100     Lab Results  Component Value Date   IRON 125 02/25/2013   TIBC 285 02/25/2013   FERRITIN 60 02/25/2013      ASSESSMENT:  1. Iron deficiency anemia requiring IV support secondary to poor absorption but more importantly GI blood losses from Cecal AVMs.  2. Vascular disease  3. Aneurysm of chest and abdomen  4. O2 Dependent COPD  5. ECOG status of 3-4  6. Element on anhedonia, defer to PCP 7. Failure to thrive  Patient Active Problem List   Diagnosis Date  Noted  . C. difficile colitis 09/13/2012  . HCAP (healthcare-associated pneumonia) 08/27/2012  . SIRS (systemic inflammatory response syndrome) 08/27/2012  . Heart failure, systolic and diastolic 08/27/2012  . Septic shock 08/27/2012  . Acute on chronic systolic heart failure 08/17/2012  . Acute encephalopathy 08/17/2012  . Acute systolic congestive heart failure 08/15/2012  . Aortic stenosis, moderate 08/15/2012  . Pulmonary hypertension due to COPD 08/15/2012  . Tricuspid valve regurgitation 08/15/2012  . Hyperglycemia, drug-induced 08/14/2012  . Acute respiratory failure with hypercapnia 08/14/2012  . CHF (congestive heart failure) 08/13/2012  . Acute-on-chronic respiratory failure 08/13/2012  . Macrocytosis without anemia 08/13/2012  . Acute on chronic diastolic congestive heart failure 08/03/2012  . Abdominal pain 07/31/2012  . Steroid-induced hyperglycemia 07/14/2012  . Acute respiratory failure 07/12/2012  . Routine  general medical examination at a health care facility 05/14/2012  . Mitral valve prolapse   . Obesity   . Atrial fibrillation, chronic   . Arteriosclerotic cardiovascular disease (ASCVD)   . GERD (gastroesophageal reflux disease)   . Prostate cancer   . Aorta aneurysm   . Allergic rhinitis 03/31/2012  . Chronic respiratory failure 03/21/2012  . Chronic diastolic CHF (congestive heart failure) 03/21/2012  . Anemia, iron deficiency 02/19/2012  . Hearing loss 01/21/2012  . Gait disturbance 05/08/2011  . COPD exacerbation 03/17/2011  . Peripheral vascular disease 01/18/2011  . KNEE, ARTHRITIS, DEGEN./OSTEO 05/10/2009  . IMPAIRED GLUCOSE TOLERANCE 12/17/2007  . HYPERLIPIDEMIA 12/17/2007     PLAN:  1. I personally reviewed and went over laboratory results with the patient. 2. Labs in 2 and 4 months: CBC diff, Iron/TIBC, Ferritin 3. Continue close follow-up with PCP 4. Return in 4 months   THERAPY PLAN:  From a hematological standpoint, we cannot make  his Hemoglobin any better than it is at 14.6 g/dL.  His iron studies do not show iron deficiency.  I suspect his failure to thrive is secondary to depression, anhedonia, and other chronic issues (in addition to his age).    All questions were answered. The patient knows to call the clinic with any problems, questions or concerns. We can certainly see the patient much sooner if necessary.  Patient and plan discussed with Dr. Erline Hau and he is in agreement with the aforementioned.   I spent 20 minutes counseling the patient face to face. The total time spent in the appointment was 25 minutes.  More than 50% of the time spent with the patient was utilized for counseling and coordination of care.   KEFALAS,THOMAS

## 2013-03-05 NOTE — Patient Instructions (Signed)
[facility] Discharge Instructions  RECOMMENDATIONS MADE BY THE CONSULTANT AND ANY TEST RESULTS WILL BE SENT TO YOUR REFERRING PHYSICIAN. 1.Labs in 2 months and 4 months. 2.  Return to Clinic in 4 months for office visit. 3.  Follow up with your primary doctor for any other issues. 4.  Iron studies were within normal limits.   Thank you for choosing [facility] to provide your oncology and hematology care.  To afford each patient quality time with our providers, please arrive at least 15 minutes before your scheduled appointment time.  With your help, our goal is to use those 15 minutes to complete the necessary work-up to ensure our physicians have the information they need to help with your evaluation and healthcare recommendations.    Effective January 1st, 2014, we ask that you re-schedule your appointment with our physicians should you arrive 10 or more minutes late for your appointment.  We strive to give you quality time with our providers, and arriving late affects you and other patients whose appointments are after yours.    Again, thank you for choosing [facility].  Our hope is that these requests will decrease the amount of time that you wait before being seen by our physicians.       _____________________________________________________________  Should you have questions after your visit to [facility], please contact our office at [phone] between the hours of 8:30 a.m. and 5:00 p.m.  Voicemails left after 4:30 p.m. will not be returned until the following business day.  For prescription refill requests, have your pharmacy contact our office with your prescription refill request.

## 2013-03-24 DIAGNOSIS — A0472 Enterocolitis due to Clostridium difficile, not specified as recurrent: Secondary | ICD-10-CM

## 2013-03-24 DIAGNOSIS — M7989 Other specified soft tissue disorders: Secondary | ICD-10-CM

## 2013-03-24 HISTORY — DX: Other specified soft tissue disorders: M79.89

## 2013-03-24 HISTORY — DX: Enterocolitis due to Clostridium difficile, not specified as recurrent: A04.72

## 2013-03-31 ENCOUNTER — Inpatient Hospital Stay (HOSPITAL_COMMUNITY)
Admission: EM | Admit: 2013-03-31 | Discharge: 2013-04-04 | DRG: 193 | Disposition: A | Discharge status: Home Health | Payer: Medicare Other | Attending: Internal Medicine | Admitting: Internal Medicine

## 2013-03-31 ENCOUNTER — Encounter (HOSPITAL_COMMUNITY): Payer: Self-pay | Admitting: Emergency Medicine

## 2013-03-31 ENCOUNTER — Telehealth: Payer: Self-pay

## 2013-03-31 ENCOUNTER — Emergency Department (HOSPITAL_COMMUNITY): Payer: Medicare Other

## 2013-03-31 DIAGNOSIS — Z823 Family history of stroke: Secondary | ICD-10-CM

## 2013-03-31 DIAGNOSIS — M1990 Unspecified osteoarthritis, unspecified site: Secondary | ICD-10-CM | POA: Diagnosis present

## 2013-03-31 DIAGNOSIS — I482 Chronic atrial fibrillation, unspecified: Secondary | ICD-10-CM | POA: Diagnosis present

## 2013-03-31 DIAGNOSIS — I2510 Atherosclerotic heart disease of native coronary artery without angina pectoris: Secondary | ICD-10-CM | POA: Diagnosis present

## 2013-03-31 DIAGNOSIS — E669 Obesity, unspecified: Secondary | ICD-10-CM | POA: Diagnosis present

## 2013-03-31 DIAGNOSIS — J449 Chronic obstructive pulmonary disease, unspecified: Secondary | ICD-10-CM | POA: Diagnosis present

## 2013-03-31 DIAGNOSIS — D509 Iron deficiency anemia, unspecified: Secondary | ICD-10-CM | POA: Diagnosis present

## 2013-03-31 DIAGNOSIS — Z87891 Personal history of nicotine dependence: Secondary | ICD-10-CM

## 2013-03-31 DIAGNOSIS — I359 Nonrheumatic aortic valve disorder, unspecified: Secondary | ICD-10-CM | POA: Diagnosis present

## 2013-03-31 DIAGNOSIS — J441 Chronic obstructive pulmonary disease with (acute) exacerbation: Secondary | ICD-10-CM

## 2013-03-31 DIAGNOSIS — E119 Type 2 diabetes mellitus without complications: Secondary | ICD-10-CM | POA: Diagnosis present

## 2013-03-31 DIAGNOSIS — I059 Rheumatic mitral valve disease, unspecified: Secondary | ICD-10-CM | POA: Diagnosis present

## 2013-03-31 DIAGNOSIS — J4489 Other specified chronic obstructive pulmonary disease: Secondary | ICD-10-CM | POA: Diagnosis present

## 2013-03-31 DIAGNOSIS — Z951 Presence of aortocoronary bypass graft: Secondary | ICD-10-CM

## 2013-03-31 DIAGNOSIS — J189 Pneumonia, unspecified organism: Principal | ICD-10-CM | POA: Diagnosis present

## 2013-03-31 DIAGNOSIS — J9600 Acute respiratory failure, unspecified whether with hypoxia or hypercapnia: Secondary | ICD-10-CM | POA: Diagnosis present

## 2013-03-31 DIAGNOSIS — Z23 Encounter for immunization: Secondary | ICD-10-CM

## 2013-03-31 DIAGNOSIS — I5043 Acute on chronic combined systolic (congestive) and diastolic (congestive) heart failure: Secondary | ICD-10-CM | POA: Diagnosis present

## 2013-03-31 DIAGNOSIS — E785 Hyperlipidemia, unspecified: Secondary | ICD-10-CM | POA: Diagnosis present

## 2013-03-31 DIAGNOSIS — C61 Malignant neoplasm of prostate: Secondary | ICD-10-CM | POA: Diagnosis present

## 2013-03-31 DIAGNOSIS — I719 Aortic aneurysm of unspecified site, without rupture: Secondary | ICD-10-CM

## 2013-03-31 DIAGNOSIS — I509 Heart failure, unspecified: Secondary | ICD-10-CM | POA: Diagnosis present

## 2013-03-31 DIAGNOSIS — J9620 Acute and chronic respiratory failure, unspecified whether with hypoxia or hypercapnia: Secondary | ICD-10-CM | POA: Diagnosis present

## 2013-03-31 DIAGNOSIS — Z8546 Personal history of malignant neoplasm of prostate: Secondary | ICD-10-CM

## 2013-03-31 DIAGNOSIS — Z9981 Dependence on supplemental oxygen: Secondary | ICD-10-CM

## 2013-03-31 DIAGNOSIS — Z8249 Family history of ischemic heart disease and other diseases of the circulatory system: Secondary | ICD-10-CM

## 2013-03-31 DIAGNOSIS — Z954 Presence of other heart-valve replacement: Secondary | ICD-10-CM

## 2013-03-31 DIAGNOSIS — I7090 Unspecified atherosclerosis: Secondary | ICD-10-CM

## 2013-03-31 DIAGNOSIS — I252 Old myocardial infarction: Secondary | ICD-10-CM

## 2013-03-31 DIAGNOSIS — Z79899 Other long term (current) drug therapy: Secondary | ICD-10-CM

## 2013-03-31 DIAGNOSIS — I4891 Unspecified atrial fibrillation: Secondary | ICD-10-CM | POA: Diagnosis present

## 2013-03-31 DIAGNOSIS — K219 Gastro-esophageal reflux disease without esophagitis: Secondary | ICD-10-CM | POA: Diagnosis present

## 2013-03-31 DIAGNOSIS — I10 Essential (primary) hypertension: Secondary | ICD-10-CM | POA: Diagnosis present

## 2013-03-31 LAB — BASIC METABOLIC PANEL
BUN: 24 mg/dL — ABNORMAL HIGH (ref 6–23)
CO2: 32 mEq/L (ref 19–32)
Chloride: 97 mEq/L (ref 96–112)
GFR calc Af Amer: 65 mL/min — ABNORMAL LOW (ref >90)
Glucose, Bld: 142 mg/dL — ABNORMAL HIGH (ref 70–99)
Potassium: 4.1 mEq/L (ref 3.5–5.1)
Sodium: 137 mEq/L (ref 135–145)

## 2013-03-31 LAB — CBC WITH DIFFERENTIAL/PLATELET
Basophils Absolute: 0.1 10*3/uL (ref 0.0–0.1)
Basophils Relative: 1 % (ref 0–1)
HCT: 44.2 % (ref 39.0–52.0)
Hemoglobin: 13.7 g/dL (ref 13.0–17.0)
Lymphocytes Relative: 9 % — ABNORMAL LOW (ref 12–46)
Lymphs Abs: 0.6 10*3/uL — ABNORMAL LOW (ref 0.7–4.0)
MCV: 100.7 fL — ABNORMAL HIGH (ref 78.0–100.0)
Monocytes Relative: 13 % — ABNORMAL HIGH (ref 3–12)
Neutro Abs: 4.7 10*3/uL (ref 1.7–7.7)
Neutrophils Relative %: 75 % (ref 43–77)
WBC: 6.3 10*3/uL (ref 4.0–10.5)

## 2013-03-31 MED ORDER — PIPERACILLIN-TAZOBACTAM 3.375 G IVPB
3.3750 g | Freq: Three times a day (TID) | INTRAVENOUS | Status: DC
  Administered 2013-03-31 – 2013-04-03 (×8): 3.375 g via INTRAVENOUS
  Filled 2013-03-31 (×15): qty 50

## 2013-03-31 MED ORDER — PIPERACILLIN-TAZOBACTAM 3.375 G IVPB
INTRAVENOUS | Status: AC
  Filled 2013-03-31: qty 100

## 2013-03-31 MED ORDER — ONDANSETRON HCL 4 MG/2ML IJ SOLN: 4.0000 mg | Freq: Four times a day (QID) | INTRAMUSCULAR | Status: DC | PRN

## 2013-03-31 MED ORDER — TAMSULOSIN HCL 0.4 MG PO CAPS
0.4000 mg | ORAL_CAPSULE | Freq: Every day | ORAL | Status: DC
  Administered 2013-04-01 – 2013-04-04 (×4): 0.4 mg via ORAL
  Filled 2013-03-31 (×4): qty 1

## 2013-03-31 MED ORDER — ALBUTEROL SULFATE (5 MG/ML) 0.5% IN NEBU
2.5000 mg | INHALATION_SOLUTION | Freq: Four times a day (QID) | RESPIRATORY_TRACT | Status: DC
  Administered 2013-04-01 – 2013-04-04 (×14): 2.5 mg via RESPIRATORY_TRACT
  Filled 2013-03-31 (×14): qty 0.5

## 2013-03-31 MED ORDER — INFLUENZA VAC SPLIT QUAD 0.5 ML IM SUSP
0.5000 mL | INTRAMUSCULAR | Status: AC
  Administered 2013-04-01: 0.5 mL via INTRAMUSCULAR
  Filled 2013-03-31: qty 0.5

## 2013-03-31 MED ORDER — SACCHAROMYCES BOULARDII 250 MG PO CAPS
250.0000 mg | ORAL_CAPSULE | Freq: Two times a day (BID) | ORAL | Status: DC
  Administered 2013-03-31 – 2013-04-04 (×8): 250 mg via ORAL
  Filled 2013-03-31 (×8): qty 1

## 2013-03-31 MED ORDER — DEXTROSE 5 % IV SOLN
1.0000 g | Freq: Once | INTRAVENOUS | Status: AC
  Administered 2013-03-31: 1 g via INTRAVENOUS
  Filled 2013-03-31: qty 10

## 2013-03-31 MED ORDER — ACETAMINOPHEN 650 MG RE SUPP: 650.0000 mg | Freq: Four times a day (QID) | RECTAL | Status: DC | PRN

## 2013-03-31 MED ORDER — MOMETASONE FURO-FORMOTEROL FUM 100-5 MCG/ACT IN AERO
INHALATION_SPRAY | RESPIRATORY_TRACT | Status: AC
  Filled 2013-03-31: qty 8.8

## 2013-03-31 MED ORDER — SODIUM CHLORIDE 0.9 % IV SOLN
INTRAVENOUS | Status: AC
  Administered 2013-03-31: 75 mL/h via INTRAVENOUS

## 2013-03-31 MED ORDER — DEXTROSE 5 % IV SOLN: 500.0000 mg | Freq: Once | INTRAVENOUS | Status: DC

## 2013-03-31 MED ORDER — ALBUTEROL SULFATE (5 MG/ML) 0.5% IN NEBU
5.0000 mg | INHALATION_SOLUTION | Freq: Once | RESPIRATORY_TRACT | Status: AC
  Administered 2013-03-31: 5 mg via RESPIRATORY_TRACT
  Filled 2013-03-31: qty 1

## 2013-03-31 MED ORDER — DOCUSATE SODIUM 100 MG PO CAPS
100.0000 mg | ORAL_CAPSULE | Freq: Two times a day (BID) | ORAL | Status: DC
  Administered 2013-03-31 – 2013-04-04 (×8): 100 mg via ORAL
  Filled 2013-03-31 (×8): qty 1

## 2013-03-31 MED ORDER — DIGOXIN 125 MCG PO TABS
0.1250 mg | ORAL_TABLET | Freq: Every day | ORAL | Status: DC
  Administered 2013-04-01 – 2013-04-04 (×4): 0.125 mg via ORAL
  Filled 2013-03-31 (×4): qty 1

## 2013-03-31 MED ORDER — POTASSIUM CHLORIDE CRYS ER 20 MEQ PO TBCR
20.0000 meq | EXTENDED_RELEASE_TABLET | Freq: Every day | ORAL | Status: DC
  Administered 2013-04-01 – 2013-04-04 (×4): 20 meq via ORAL
  Filled 2013-03-31 (×4): qty 1

## 2013-03-31 MED ORDER — SIMVASTATIN 20 MG PO TABS
20.0000 mg | ORAL_TABLET | Freq: Every day | ORAL | Status: DC
  Administered 2013-04-01 – 2013-04-03 (×3): 20 mg via ORAL
  Filled 2013-03-31 (×3): qty 1

## 2013-03-31 MED ORDER — IPRATROPIUM BROMIDE 0.02 % IN SOLN
0.5000 mg | Freq: Once | RESPIRATORY_TRACT | Status: AC
  Administered 2013-03-31: 0.5 mg via RESPIRATORY_TRACT
  Filled 2013-03-31: qty 2.5

## 2013-03-31 MED ORDER — PANTOPRAZOLE SODIUM 40 MG PO TBEC
40.0000 mg | DELAYED_RELEASE_TABLET | Freq: Every day | ORAL | Status: DC
  Administered 2013-04-01 – 2013-04-04 (×4): 40 mg via ORAL
  Filled 2013-03-31 (×4): qty 1

## 2013-03-31 MED ORDER — ACETAMINOPHEN 325 MG PO TABS: 650.0000 mg | ORAL_TABLET | Freq: Four times a day (QID) | ORAL | Status: DC | PRN

## 2013-03-31 MED ORDER — MOMETASONE FURO-FORMOTEROL FUM 100-5 MCG/ACT IN AERO
2.0000 | INHALATION_SPRAY | Freq: Two times a day (BID) | RESPIRATORY_TRACT | Status: DC
  Administered 2013-03-31 – 2013-04-04 (×8): 2 via RESPIRATORY_TRACT
  Filled 2013-03-31: qty 8.8

## 2013-03-31 MED ORDER — FUROSEMIDE 40 MG PO TABS
60.0000 mg | ORAL_TABLET | Freq: Every day | ORAL | Status: DC
  Administered 2013-04-01 – 2013-04-04 (×4): 60 mg via ORAL
  Filled 2013-03-31 (×8): qty 1

## 2013-03-31 MED ORDER — GUAIFENESIN ER 600 MG PO TB12
600.0000 mg | ORAL_TABLET | Freq: Two times a day (BID) | ORAL | Status: DC
  Administered 2013-03-31 – 2013-04-04 (×8): 600 mg via ORAL
  Filled 2013-03-31 (×8): qty 1

## 2013-03-31 MED ORDER — ONDANSETRON HCL 4 MG PO TABS: 4.0000 mg | ORAL_TABLET | Freq: Four times a day (QID) | ORAL | Status: DC | PRN

## 2013-03-31 MED ORDER — LEVOFLOXACIN IN D5W 750 MG/150ML IV SOLN
750.0000 mg | INTRAVENOUS | Status: DC
  Administered 2013-03-31 – 2013-04-02 (×3): 750 mg via INTRAVENOUS
  Filled 2013-03-31 (×5): qty 150

## 2013-03-31 MED ORDER — SODIUM CHLORIDE 0.9 % IJ SOLN
3.0000 mL | Freq: Two times a day (BID) | INTRAMUSCULAR | Status: DC
  Administered 2013-04-01 – 2013-04-04 (×5): 3 mL via INTRAVENOUS

## 2013-03-31 MED ORDER — ALBUTEROL SULFATE (5 MG/ML) 0.5% IN NEBU: 2.5000 mg | INHALATION_SOLUTION | RESPIRATORY_TRACT | Status: DC | PRN

## 2013-03-31 MED ORDER — LEVOFLOXACIN IN D5W 750 MG/150ML IV SOLN
INTRAVENOUS | Status: AC
  Filled 2013-03-31: qty 150

## 2013-03-31 NOTE — ED Notes (Signed)
Given meal - ate without problems - 75% of meal tray

## 2013-03-31 NOTE — Progress Notes (Signed)
ANTIBIOTIC CONSULT NOTE - INITIAL  Pharmacy Consult for Zosyn Indication: pneumonia, possible aspiration  Allergies  Allergen Reactions  . Aspirin   . Gabapentin     Patient cannot take over 200 mg dose at a time as it causes jerking or spasms  . Naproxen     Patient Measurements: Height: 6\' 3"  (190.5 cm) Weight: 228 lb (103.42 kg) IBW/kg (Calculated) : 84.5   Vital Signs: Temp: 98.7 F (37.1 C) (10/08 2226) Temp src: Oral (10/08 1854) BP: 118/96 mmHg (10/08 2200) Pulse Rate: 83 (10/08 2105) Intake/Output from previous day:   Intake/Output from this shift:    Labs:  Recent Labs  03/31/13 2004  WBC 6.3  HGB 13.7  PLT PLATELET CLUMPS NOTED ON SMEAR, COUNT APPEARS ADEQUATE  CREATININE 1.12   Estimated Creatinine Clearance: 58.2 ml/min (by C-G formula based on Cr of 1.12). No results found for this basename: VANCOTROUGH, VANCOPEAK, VANCORANDOM, GENTTROUGH, GENTPEAK, GENTRANDOM, TOBRATROUGH, TOBRAPEAK, TOBRARND, AMIKACINPEAK, AMIKACINTROU, AMIKACIN,  in the last 72 hours   Microbiology: No results found for this or any previous visit (from the past 720 hour(s)).  Medical History: Past Medical History  Diagnosis Date  . Mitral valve prolapse 2003    MVR in 2003  . Obesity   . Osteoarthritis   . Hypertension   . Hyperlipidemia   . COPD (chronic obstructive pulmonary disease)   . CHF (congestive heart failure) diastolic 10/2010    CXR in 12/2010: Prior CABG, cardiomegaly, vascular redistribution, bibasilar atelectasis, small effusions  . Pneumonia   . Impaired glucose tolerance   . Atrial fibrillation, chronic   . Prostate cancer     elevated PSA  . GERD (gastroesophageal reflux disease)     + hiatal hernia  . Arteriosclerotic cardiovascular disease (ASCVD) 1994    stent to RCA; CABG-2003  . Hard of hearing   . Anemia, iron deficiency   . AAA (abdominal aortic aneurysm)     Fusiform; infrarenal; 4-4.1cm on CT in 07/2008 and 12/2010 by MRI  . Gastric ulcer  2004    2004; upper GI bleed  . Renal cysts, acquired, bilateral     Complex by MRI in 12/2010  . Adenomatous polyps 04/07/2012  . HYPERTENSION 12/17/2007    Subsequently hypotensive and all antihypertensive medication discontinued Lab  03/2012: Mild anemia with H&H-11.8/37.5, MCV-97, ferritin-224, normal CMet ex G-124, alb-3.4   . NSTEMI (non-ST elevated myocardial infarction) 01/18/2011  . Diabetes mellitus, type II 07/13/12    family denies patient is diabetic  . Macrocytosis without anemia 08/13/2012  . Acute systolic congestive heart failure 08/15/2012    EF 35-40%, per Echo  . Aortic stenosis, moderate 08/15/2012  . Pulmonary hypertension due to COPD 08/15/2012    53 mm Hg  . Tricuspid valve regurgitation 08/15/2012  . Retroperitoneal bleed 06/2012    Medications:  Scheduled:   Assessment: Okay for Protocol  Goal of Therapy:  Eradicate infection.  Plan:  Zosyn 3.375gm IV every 8 hours. Follow-up micro data, labs, vitals.  Lamonte Richer R 03/31/2013,10:32 PM

## 2013-03-31 NOTE — ED Notes (Signed)
Clarified orders of antibiotics with Dr Rito Ehrlich.  Give antibiotics as ordered in ED

## 2013-03-31 NOTE — ED Notes (Signed)
Applied Fall Rish bracelet in Triage.

## 2013-03-31 NOTE — Telephone Encounter (Signed)
Needs Ed eval if his oxygen is in the 40's

## 2013-03-31 NOTE — H&P (Signed)
[facility] History and Physical  [name] WJX:[id number] DOB: Nov 03, 1921 DOA: 03/31/2013   PCP: [name], MD  Specialists: He has seen Dr. Juanetta Gosling. In the past  Chief Complaint: Shortness of breath for 3 weeks  HPI: [name] is a 79 y.o. male with a past medical history of advanced COPD on home oxygen, congestive heart failure, history of retroperitoneal hematoma earlier this year, abdominal aortic aneurysm, coronary artery disease, who is accompanied by his daughter today. He lives with his daughter. According to the daughter he has been more short of breath for the last 3 weeks and has noticed to be more hypoxic during that time. His saturations at home usually are between 90-93%. With exertion in the last 3, weeks they've been dropping to the 70s. Patient has been mildly confused. However, there's been no history of any cough or fever or chest pains. No nausea, vomiting. He's been eating meals regularly. He's had regular bowel movements. Patient is confused at this time, and unable to provide much history. He was last hospitalized in March and at that time he was sent home with home hospice. However, during the past many months patient has felt better, so hospice services were discontinued. He also had his CODE STATUS changed according to the daughter to FULL CODE.  Home Medications: Prior to Admission medications   Medication Sig Start Date End Date Taking? Authorizing Provider  albuterol (PROVENTIL) (2.5 MG/3ML) 0.083% nebulizer solution Take 2.5 mg by nebulization every 6 (six) hours as needed for wheezing or shortness of breath.   Yes Historical Provider, MD  digoxin (LANOXIN) 0.125 MG tablet Take 0.125 mg by mouth daily.   Yes Historical Provider, MD  Fluticasone-Salmeterol (ADVAIR) 100-50 MCG/DOSE AEPB Inhale 1 puff into the lungs every 12 (twelve) hours.   Yes Historical Provider, MD  furosemide (LASIX) 20 MG tablet Take 60 mg by mouth daily.   Yes  Historical Provider, MD  guaifenesin (HUMIBID E) 400 MG TABS tablet Take 400 mg by mouth every 4 (four) hours as needed. Cough/congestion   Yes Historical Provider, MD  lovastatin (MEVACOR) 40 MG tablet Take 40 mg by mouth 4 (four) times a week. Patient takes on Sunday,Monday,Wednesday,Friday   Yes Historical Provider, MD  Multiple Vitamins-Minerals (CENTRUM SILVER) tablet Take 1 tablet by mouth daily.   Yes Historical Provider, MD  omeprazole (PRILOSEC) 40 MG capsule Take 40 mg by mouth daily.   Yes Historical Provider, MD  potassium chloride SA (K-DUR,KLOR-CON) 20 MEQ tablet Take 20 mEq by mouth 2 (two) times daily.   Yes Historical Provider, MD  tamsulosin (FLOMAX) 0.4 MG CAPS capsule Take 0.4 mg by mouth daily.   Yes Historical Provider, MD    Allergies:  Allergies  Allergen Reactions  . Aspirin   . Gabapentin     Patient cannot take over 200 mg dose at a time as it causes jerking or spasms  . Naproxen     Past Medical History: Past Medical History  Diagnosis Date  . Mitral valve prolapse 2003    MVR in 2003  . Obesity   . Osteoarthritis   . Hypertension   . Hyperlipidemia   . COPD (chronic obstructive pulmonary disease)   . CHF (congestive heart failure) diastolic 10/2010    CXR in 12/2010: Prior CABG, cardiomegaly, vascular redistribution, bibasilar atelectasis, small effusions  . Pneumonia   . Impaired glucose tolerance   . Atrial fibrillation, chronic   . Prostate cancer     elevated PSA  .  GERD (gastroesophageal reflux disease)     + hiatal hernia  . Arteriosclerotic cardiovascular disease (ASCVD) 1994    stent to RCA; CABG-2003  . Hard of hearing   . Anemia, iron deficiency   . AAA (abdominal aortic aneurysm)     Fusiform; infrarenal; 4-4.1cm on CT in 07/2008 and 12/2010 by MRI  . Gastric ulcer 2004    2004; upper GI bleed  . Renal cysts, acquired, bilateral     Complex by MRI in 12/2010  . Adenomatous polyps 04/07/2012  . HYPERTENSION 12/17/2007    Subsequently  hypotensive and all antihypertensive medication discontinued Lab  03/2012: Mild anemia with H&H-11.8/37.5, MCV-97, ferritin-224, normal CMet ex G-124, alb-3.4   . NSTEMI (non-ST elevated myocardial infarction) 01/18/2011  . Diabetes mellitus, type II 07/13/12    family denies patient is diabetic  . Macrocytosis without anemia 08/13/2012  . Acute systolic congestive heart failure 08/15/2012    EF 35-40%, per Echo  . Aortic stenosis, moderate 08/15/2012  . Pulmonary hypertension due to COPD 08/15/2012    53 mm Hg  . Tricuspid valve regurgitation 08/15/2012  . Retroperitoneal bleed 06/2012    Past Surgical History  Procedure Laterality Date  . Knee surgery      Left  . Bladder surgery    . Femoral artery stent  12-06-10    Left SFA  . Appendectomy    . Mitral valve replacement (mvr)/coronary artery bypass grafting (cabg)  2003    stent to RCA in 1994  . Eye surgery  2007    bilateral cataracts  . Tonsillectomy      thinks they were removed while in the [employer]  . Mass excision  06/07/2011    Procedure: EXCISION MASS;  Surgeon: Fabio Bering;  Location: [facility];  Service: General;  Laterality: N/A;  excision of 2 masses back and buttocks  . Back surgery    . Esophagogastroduodenoscopy  03/03/2003    Large, deep prepyloric ulcer, as described above without bleeding/ Normal esophagus  . Esophagogastroduodenoscopy  09/03/2004    normal throughout  . Colonoscopy  09/03/2004    small ulcer, without stigmata of bleeding  . Esophagogastroduodenoscopy  11/14/2004    Normal esophagus and small hiatal hernia, otherwise normal stomach   . Colonoscopy  11/14/2004    Internal hemorrhoids, otherwise normal rectum/ left-sided diverticula , diffusely oozing right colon mucosa without a discrete lesion amenable to endoscopic therapy. 2 diminutive polyps. FELT TO HAVE AVMs/telangiectasias  . Colonoscopy  01/29/2005    Normal rectum  . Colonoscopy  03/11/2012    Colonic diverticulosis. Colonic  polyps-removed as described above. Vascular anomalies in the cecum likely representing hemangiomas. Status post hemostasis clipping of  2 of the 3. ADENOMATOUS POLYPS. Repeat 2016  . Esophagogastroduodenoscopy  03/11/2012    Deformity of the antrum;  small polyp in antrum-not manipulated. Otherwise normal exam    Social History:  reports that he quit smoking about 5 years ago. His smoking use included Cigarettes. He has a 70 pack-year smoking history. He has quit using smokeless tobacco. He reports that he does not drink alcohol or use illicit drugs.  Living Situation: Lives with his daughter Activity Level: Unknown   Family History:  Family History  Problem Relation Age of Onset  . Stroke Mother   . Stroke Brother   . Heart attack Brother   . Cancer Brother   . Cancer Brother   . Anesthesia problems Neg Hx   . Hypotension Neg Hx   .  Malignant hyperthermia Neg Hx   . Pseudochol deficiency Neg Hx      Review of Systems - Unable to do due to confusion  Physical Examination  Filed Vitals:   03/31/13 1854 03/31/13 2009 03/31/13 2010 03/31/13 2105  BP: 127/41  89/68 122/62  Pulse: 38  90 83  Temp: 98.7 F (37.1 C)     TempSrc: Oral     Resp: 16  18 20   Height: 6\' 3"  (1.905 m)     Weight: 103.42 kg (228 lb)     SpO2: 87% 91% 90% 90%    General appearance: alert, distracted and no distress Head: Normocephalic, without obvious abnormality, atraumatic Eyes: conjunctivae/corneas clear. PERRL, EOM's intact.  Throat: lips, mucosa, and tongue normal; teeth and gums normal Neck: no adenopathy, no carotid bruit, no JVD, supple, symmetrical, trachea midline and thyroid not enlarged, symmetric, no tenderness/mass/nodules Resp: Decreased air entry at the bases with a few crackles in the right. Cardio: regular rate and rhythm, S1, S2 normal, no murmur, click, rub or gallop GI: soft, non-tender; bowel sounds normal; no masses,  no organomegaly Extremities: extremities normal, atraumatic,  no cyanosis or edema Pulses: Poorly palpable, but present Skin: Skin color, texture, turgor normal. No rashes or lesions Lymph nodes: Cervical, supraclavicular, and axillary nodes normal. Neurologic: He is alert. He is oriented to place, and person. No cranial nerve deficits. No motor strength deficits bilaterally.  Laboratory Data: Results for orders placed during the hospital encounter of 03/31/13 (from the past 48 hour(s))  CBC WITH DIFFERENTIAL     Status: Abnormal   Collection Time    03/31/13  8:04 PM      Result Value Range   WBC 6.3  4.0 - 10.5 K/uL   RBC 4.39  4.22 - 5.81 MIL/uL   Hemoglobin 13.7  13.0 - 17.0 g/dL   HCT 16.1  09.6 - 04.5 %   MCV 100.7 (*) 78.0 - 100.0 fL   MCH 31.2  26.0 - 34.0 pg   MCHC 31.0  30.0 - 36.0 g/dL   RDW 40.9  81.1 - 91.4 %   Platelets    150 - 400 K/uL   Value: PLATELET CLUMPS NOTED ON SMEAR, COUNT APPEARS ADEQUATE   Neutrophils Relative % 75  43 - 77 %   Lymphocytes Relative 9 (*) 12 - 46 %   Monocytes Relative 13 (*) 3 - 12 %   Eosinophils Relative 2  0 - 5 %   Basophils Relative 1  0 - 1 %   Neutro Abs 4.7  1.7 - 7.7 K/uL   Lymphs Abs 0.6 (*) 0.7 - 4.0 K/uL   Monocytes Absolute 0.8  0.1 - 1.0 K/uL   Eosinophils Absolute 0.1  0.0 - 0.7 K/uL   Basophils Absolute 0.1  0.0 - 0.1 K/uL   Smear Review       Value: PLATELET CLUMPS NOTED ON SMEAR, COUNT APPEARS ADEQUATE  BASIC METABOLIC PANEL     Status: Abnormal   Collection Time    03/31/13  8:04 PM      Result Value Range   Sodium 137  135 - 145 mEq/L   Potassium 4.1  3.5 - 5.1 mEq/L   Chloride 97  96 - 112 mEq/L   CO2 32  19 - 32 mEq/L   Glucose, Bld 142 (*) 70 - 99 mg/dL   BUN 24 (*) 6 - 23 mg/dL   Creatinine, Ser 7.82  0.50 - 1.35 mg/dL   Calcium 9.1  8.4 - 10.5 mg/dL   GFR calc non Af Amer 56 (*) >90 mL/min   GFR calc Af Amer 65 (*) >90 mL/min   Comment: (NOTE)     The eGFR has been calculated using the CKD EPI equation.     This calculation has not been validated in all  clinical situations.     eGFR's persistently <90 mL/min signify possible Chronic Kidney     Disease.  TROPONIN I     Status: None   Collection Time    03/31/13  8:04 PM      Result Value Range   Troponin I <0.30  <0.30 ng/mL   Comment:            Due to the release kinetics of cTnI,     a negative result within the first hours     of the onset of symptoms does not rule out     myocardial infarction with certainty.     If myocardial infarction is still suspected,     repeat the test at appropriate intervals.    Radiology Reports: Dg Chest Port 1 View  03/31/2013   CLINICAL DATA:  Short of breath  EXAM: PORTABLE CHEST - 1 VIEW  COMPARISON:  08/27/2012  FINDINGS: Severe cardiomegaly. There is opacity at the right lung base and shift of the mediastinum to the right likely a combination of right pleural effusion, right lower lobe collapse, and underlying airspace disease. Mild patchy densities at the left base. Vascular congestion. No pneumothorax.  IMPRESSION: Cardiomegaly.  Right basilar lung collapse, consolidation, and right pleural effusion.  Minimal airspace disease at the left base.   Electronically Signed   By: Maryclare Bean M.D.   On: 03/31/2013 20:07    Electrocardiogram: Appears to be atrial fibrillation with PVCs. Difficult rhythm to interpret  Problem List  Principal Problem:   Community acquired pneumonia Active Problems:   Atrial fibrillation, chronic   Arteriosclerotic cardiovascular disease (ASCVD)   Prostate cancer   Aorta aneurysm   Acute respiratory failure   Acute-on-chronic respiratory failure   Assessment: This is 79 year old, Caucasian male, who presents with a worsening dyspnea for the last 3 weeks. He appears to have right basilar consolidation and pleural effusion with some collapse of the lung. This could be community acquired pneumonia. Aspiration, cannot be ruled out. He also has severe underlying COPD.  Plan: #1 pneumonia, community-acquired versus  aspiration: We will treat him with Levaquin and add Zosyn. Monitor him in step down unit. Consult Dr. Juanetta Gosling in the morning. We may need to get CT scan of his chest if he does not improve.  #2 acute on chronic respiratory failure: Check ABG. May have to use BiPAP if he worsens.  #3 history of systolic congestive heart failure, chronic: Appears to be well compensated at this time. Continue to monitor.  #4 history of atrial fibrillation: Rate is controlled. Continue digoxin. Check digoxin level. He's not on anticoagulation due to retroperitoneal bleeding recently.  #5 history of abdominal aortic aneurysm: He used to be seen by vascular surgery in [place]. However, has not followed up with them recently. He does not have any abdominal pain. Doesn't appear to be an active issue at this time.   DVT Prophylaxis: SCDs Code Status: Full code. Daughter tells me that recently he changed his mind and would like all interventions to be done if necessary. But she thinks that he would not want prolonged life support. Family Communication: Discussed with the daughter  Disposition  Plan: Admit to step down   Further management decisions will depend on results of further testing and patient's response to treatment.  [facility]  [facility] Pager [phone]  If 7PM-7AM, please contact night-coverage [email or web address] Password [facility]  03/31/2013, 9:32 PM

## 2013-03-31 NOTE — ED Notes (Signed)
Per pt's daughter, "He's been staying in bed the past three weeks, he's very weak. I think he's taking his oxygen off at night and he's been hallucinating. He's drops down in to the 70's when he's moving. He's been up here before and they put that thing that pushes the air into his lungs on him. He's supposed to have a cpap."

## 2013-03-31 NOTE — Progress Notes (Signed)
RT unable to obtain ABG. RN said he would notify MD.

## 2013-03-31 NOTE — Telephone Encounter (Signed)
Agree with increasing neb to tid, oK to refer to home health, make daughter aware that no assesment will be available before am, and ed eval will be necessary if hids heart rate and BP remain as low a stated

## 2013-03-31 NOTE — ED Notes (Signed)
Sats drop when patient speaks or has any exertion.  Daughter states patient has been experiencing increased lethargy - still performs his own ADL's but sats drop into the high 70's when exerting.  Used an additional breathing treatment at home today.  Also states sats drop at night and patient frequently becomes confused, talking with people who are not present.  She states she has been advised he needs Cpap at home but no one has ordered this, just his home 02.

## 2013-03-31 NOTE — ED Provider Notes (Addendum)
CSN: [id number]     Arrival date & time 03/31/13  1845 History   First MD Initiated Contact with Patient 03/31/13 1912     Chief Complaint  Patient presents with  . Shortness of Breath  . Fatigue   (Consider location/radiation/quality/duration/timing/severity/associated sxs/prior Treatment) HPI.... level V caveat for urgent need for intervention.   Patient has terminal COPD and is on home oxygen at 3 L per minute.  His daughter reports his saturations are normally in the low 90s.   He has been very weak for the past 3 weeks and desaturates with movement.  No frank chest pain, sweats, chills, rusty sputum. Severity is moderate to severe.  Past Medical History  Diagnosis Date  . Mitral valve prolapse 2003    MVR in 2003  . Obesity   . Osteoarthritis   . Hypertension   . Hyperlipidemia   . COPD (chronic obstructive pulmonary disease)   . CHF (congestive heart failure) diastolic 10/2010    CXR in 12/2010: Prior CABG, cardiomegaly, vascular redistribution, bibasilar atelectasis, small effusions  . Pneumonia   . Impaired glucose tolerance   . Atrial fibrillation, chronic   . Prostate cancer     elevated PSA  . GERD (gastroesophageal reflux disease)     + hiatal hernia  . Arteriosclerotic cardiovascular disease (ASCVD) 1994    stent to RCA; CABG-2003  . Hard of hearing   . Anemia, iron deficiency   . AAA (abdominal aortic aneurysm)     Fusiform; infrarenal; 4-4.1cm on CT in 07/2008 and 12/2010 by MRI  . Gastric ulcer 2004    2004; upper GI bleed  . Renal cysts, acquired, bilateral     Complex by MRI in 12/2010  . Adenomatous polyps 04/07/2012  . HYPERTENSION 12/17/2007    Subsequently hypotensive and all antihypertensive medication discontinued Lab  03/2012: Mild anemia with H&H-11.8/37.5, MCV-97, ferritin-224, normal CMet ex G-124, alb-3.4   . NSTEMI (non-ST elevated myocardial infarction) 01/18/2011  . Diabetes mellitus, type II 07/13/12    family denies patient is diabetic  .  Macrocytosis without anemia 08/13/2012  . Acute systolic congestive heart failure 08/15/2012    EF 35-40%, per Echo  . Aortic stenosis, moderate 08/15/2012  . Pulmonary hypertension due to COPD 08/15/2012    53 mm Hg  . Tricuspid valve regurgitation 08/15/2012  . Retroperitoneal bleed 06/2012   Past Surgical History  Procedure Laterality Date  . Knee surgery      Left  . Bladder surgery    . Femoral artery stent  12-06-10    Left SFA  . Appendectomy    . Mitral valve replacement (mvr)/coronary artery bypass grafting (cabg)  2003    stent to RCA in 1994  . Eye surgery  2007    bilateral cataracts  . Tonsillectomy      thinks they were removed while in the [employer]  . Mass excision  06/07/2011    Procedure: EXCISION MASS;  Surgeon: Fabio Bering;  Location: [facility];  Service: General;  Laterality: N/A;  excision of 2 masses back and buttocks  . Back surgery    . Esophagogastroduodenoscopy  03/03/2003    Large, deep prepyloric ulcer, as described above without bleeding/ Normal esophagus  . Esophagogastroduodenoscopy  09/03/2004    normal throughout  . Colonoscopy  09/03/2004    small ulcer, without stigmata of bleeding  . Esophagogastroduodenoscopy  11/14/2004    Normal esophagus and small hiatal hernia, otherwise normal stomach   . Colonoscopy  11/14/2004  Internal hemorrhoids, otherwise normal rectum/ left-sided diverticula , diffusely oozing right colon mucosa without a discrete lesion amenable to endoscopic therapy. 2 diminutive polyps. FELT TO HAVE AVMs/telangiectasias  . Colonoscopy  01/29/2005    Normal rectum  . Colonoscopy  03/11/2012    Colonic diverticulosis. Colonic polyps-removed as described above. Vascular anomalies in the cecum likely representing hemangiomas. Status post hemostasis clipping of  2 of the 3. ADENOMATOUS POLYPS. Repeat 2016  . Esophagogastroduodenoscopy  03/11/2012    Deformity of the antrum;  small polyp in antrum-not manipulated. Otherwise normal exam    Family History  Problem Relation Age of Onset  . Stroke Mother   . Stroke Brother   . Heart attack Brother   . Cancer Brother   . Cancer Brother   . Anesthesia problems Neg Hx   . Hypotension Neg Hx   . Malignant hyperthermia Neg Hx   . Pseudochol deficiency Neg Hx    History  Substance Use Topics  . Smoking status: Former Smoker -- 1.00 packs/day for 70 years    Types: Cigarettes    Quit date: 06/25/2007  . Smokeless tobacco: Former Neurosurgeon  . Alcohol Use: No    Review of Systems  Unable to perform ROS: Acuity of condition    Allergies  Aspirin; Gabapentin; and Naproxen  Home Medications   Current Outpatient Rx  Name  Route  Sig  Dispense  Refill  . albuterol (PROVENTIL) (2.5 MG/3ML) 0.083% nebulizer solution   Nebulization   Take 2.5 mg by nebulization every 6 (six) hours as needed for wheezing or shortness of breath.         . digoxin (LANOXIN) 0.125 MG tablet   Oral   Take 0.125 mg by mouth daily.         . Fluticasone-Salmeterol (ADVAIR) 100-50 MCG/DOSE AEPB   Inhalation   Inhale 1 puff into the lungs every 12 (twelve) hours.         . furosemide (LASIX) 20 MG tablet   Oral   Take 60 mg by mouth daily.         Marland Kitchen guaifenesin (HUMIBID E) 400 MG TABS tablet   Oral   Take 400 mg by mouth every 4 (four) hours as needed. Cough/congestion         . lovastatin (MEVACOR) 40 MG tablet   Oral   Take 40 mg by mouth 4 (four) times a week. Patient takes on Sunday,Monday,Wednesday,Friday         . Multiple Vitamins-Minerals (CENTRUM SILVER) tablet   Oral   Take 1 tablet by mouth daily.         Marland Kitchen omeprazole (PRILOSEC) 40 MG capsule   Oral   Take 40 mg by mouth daily.         . potassium chloride SA (K-DUR,KLOR-CON) 20 MEQ tablet   Oral   Take 20 mEq by mouth 2 (two) times daily.         . tamsulosin (FLOMAX) 0.4 MG CAPS capsule   Oral   Take 0.4 mg by mouth daily.          BP 122/62  Pulse 83  Temp(Src) 98.7 F (37.1 C) (Oral)   Resp 20  Ht 6\' 3"  (1.905 m)  Wt 228 lb (103.42 kg)  BMI 28.5 kg/m2  SpO2 90% Physical Exam  Nursing note and vitals reviewed. Constitutional: He is oriented to person, place, and time.  Pale, no overt respiratory distress  HENT:  Head: Normocephalic and atraumatic.  Eyes:  Conjunctivae and EOM are normal. Pupils are equal, round, and reactive to light.  Neck: Normal range of motion. Neck supple.  Cardiovascular: Normal rate, regular rhythm and normal heart sounds.   Pulmonary/Chest:  Scattered rhonchi, decreased breath sounds right base  Abdominal: Soft. Bowel sounds are normal.  Musculoskeletal: Normal range of motion.  Neurological: He is alert and oriented to person, place, and time.  Skin: Skin is warm and dry.  Psychiatric: He has a normal mood and affect.    ED Course  Procedures (including critical care time) Labs Review Labs Reviewed  CBC WITH DIFFERENTIAL - Abnormal; Notable for the following:    MCV 100.7 (*)    Lymphocytes Relative 9 (*)    Monocytes Relative 13 (*)    Lymphs Abs 0.6 (*)    All other components within normal limits  BASIC METABOLIC PANEL - Abnormal; Notable for the following:    Glucose, Bld 142 (*)    BUN 24 (*)    GFR calc non Af Amer 56 (*)    GFR calc Af Amer 65 (*)    All other components within normal limits  TROPONIN I   Imaging Review Dg Chest Port 1 View  03/31/2013   CLINICAL DATA:  Short of breath  EXAM: PORTABLE CHEST - 1 VIEW  COMPARISON:  08/27/2012  FINDINGS: Severe cardiomegaly. There is opacity at the right lung base and shift of the mediastinum to the right likely a combination of right pleural effusion, right lower lobe collapse, and underlying airspace disease. Mild patchy densities at the left base. Vascular congestion. No pneumothorax.  IMPRESSION: Cardiomegaly.  Right basilar lung collapse, consolidation, and right pleural effusion.  Minimal airspace disease at the left base.   Electronically Signed   By: Maryclare Bean M.D.    On: 03/31/2013 20:07   Date: 03/31/2013  Rate:  81  Rhythm: a fib  QRS Axis: normal  Intervals: normal  ST/T Wave abnormalities: normal  Conduction Disutrbances: NS IV block  Narrative Interpretation: unremarkable PVC's    MDM  No diagnosis found. Patient has severe COPD. Chest x-ray shows right basilar lung collapse, consolidation, right pleural effusion. Antibiotics initiated for community-acquired pneumonia.   Discussed with Dr. Rito Ehrlich.  Will admit.    Donnetta Hutching, MD 03/31/13 2138  Donnetta Hutching, MD 04/02/13 [phone]

## 2013-04-01 ENCOUNTER — Telehealth: Payer: Self-pay | Admitting: Family Medicine

## 2013-04-01 ENCOUNTER — Inpatient Hospital Stay (HOSPITAL_COMMUNITY): Payer: Medicare Other

## 2013-04-01 DIAGNOSIS — I4891 Unspecified atrial fibrillation: Secondary | ICD-10-CM

## 2013-04-01 DIAGNOSIS — J9620 Acute and chronic respiratory failure, unspecified whether with hypoxia or hypercapnia: Secondary | ICD-10-CM

## 2013-04-01 LAB — COMPREHENSIVE METABOLIC PANEL
ALT: 14 U/L (ref 0–53)
AST: 26 U/L (ref 0–37)
Albumin: 3.7 g/dL (ref 3.5–5.2)
Alkaline Phosphatase: 69 U/L (ref 39–117)
BUN: 25 mg/dL — ABNORMAL HIGH (ref 6–23)
Calcium: 9 mg/dL (ref 8.4–10.5)
Chloride: 98 mEq/L (ref 96–112)
Creatinine, Ser: 1.08 mg/dL (ref 0.50–1.35)
GFR calc non Af Amer: 59 mL/min — ABNORMAL LOW (ref >90)
Glucose, Bld: 110 mg/dL — ABNORMAL HIGH (ref 70–99)
Potassium: 3.9 mEq/L (ref 3.5–5.1)
Total Protein: 7.1 g/dL (ref 6.0–8.3)

## 2013-04-01 LAB — CBC
HCT: 43.7 % (ref 39.0–52.0)
Hemoglobin: 13.5 g/dL (ref 13.0–17.0)
MCH: 31 pg (ref 26.0–34.0)
MCHC: 30.9 g/dL (ref 30.0–36.0)
MCV: 100.2 fL — ABNORMAL HIGH (ref 78.0–100.0)
RBC: 4.36 MIL/uL (ref 4.22–5.81)

## 2013-04-01 LAB — MRSA PCR SCREENING: MRSA by PCR: NEGATIVE

## 2013-04-01 MED ORDER — SODIUM CHLORIDE 0.9 % IJ SOLN
INTRAMUSCULAR | Status: AC
  Administered 2013-04-01: 14:00:00
  Filled 2013-04-01: qty 500

## 2013-04-01 MED ORDER — IOHEXOL 300 MG/ML  SOLN
80.0000 mL | Freq: Once | INTRAMUSCULAR | Status: AC | PRN
  Administered 2013-04-01: 80 mL via INTRAVENOUS

## 2013-04-01 NOTE — Progress Notes (Signed)
[facility] PROGRESS NOTE  [name] WNU:[id number] DOB: 1921/10/20 DOA: 03/31/2013 PCP: [name], MD  Assessment/Plan: 1. Pneumonia, community-acquired versus aspiration. Patient has been started on Levaquin and Zosyn. His respiratory status appears to be stabilizing. We will continue current treatments, mucolytics. He does have some right lower lobe collapse on chest x-ray. This will be further evaluated with a CT scan. 2. Acute on chronic respiratory failure. Patient is oxygen dependent COPD. He is requiring more oxygen than baseline. Continue to monitor clinically, appears to be stabilizing. 3. Chronic systolic congestive heart failure. Appears compensated. Will need to be cautious with IV fluids. 4. Atrial fibrillation. Rate controlled. On digoxin. Not candidate for anticoagulation due to retroperitoneal bleeding. 5. History of abdominal aortic aneurysm. Due to his age and multiple comorbidities, it does not appear that he is a candidate for operative repair. Review of primary care notes indicate that the family has also elected not to pursue further operative management.  Code Status: full code Family Communication: discussed with patient, no family present Disposition Plan: pending hospital course   Consultants:  none  Procedures:  none  Antibiotics:  Levaquin 10/8  Zosyn 10/8  HPI/Subjective: No cough, still feels short of breath, but mildly better than admission  Objective: Filed Vitals:   04/01/13 0800  BP: 125/79  Pulse: 84  Temp: 97.5 F (36.4 C)  Resp: 20    Intake/Output Summary (Last 24 hours) at 04/01/13 1020 Last data filed at 04/01/13 0859  Gross per 24 hour  Intake  897.5 ml  Output    650 ml  Net  247.5 ml   Filed Weights   03/31/13 1854 03/31/13 2250 04/01/13 0500  Weight: 103.42 kg (228 lb) 104.4 kg (230 lb 2.6 oz) 105 kg (231 lb 7.7 oz)    Exam:   General:  NAD  Cardiovascular: S1, S2 RRR  Respiratory:  diminished breath sounds at bases  Abdomen: soft, nt, nd, bs+  Musculoskeletal: no edema b/l   Data Reviewed: Basic Metabolic Panel:  Recent Labs Lab 03/31/13 2004 03/31/13 2129 04/01/13 0459  NA 137  --  138  K 4.1  --  3.9  CL 97  --  98  CO2 32  --  32  GLUCOSE 142*  --  110*  BUN 24*  --  25*  CREATININE 1.12  --  1.08  CALCIUM 9.1  --  9.0  MG  --  2.1  --    Liver Function Tests:  Recent Labs Lab 04/01/13 0459  AST 26  ALT 14  ALKPHOS 69  BILITOT 0.6  PROT 7.1  ALBUMIN 3.7   No results found for this basename: LIPASE, AMYLASE,  in the last 168 hours No results found for this basename: AMMONIA,  in the last 168 hours CBC:  Recent Labs Lab 03/31/13 2004 04/01/13 0459  WBC 6.3 5.6  NEUTROABS 4.7  --   HGB 13.7 13.5  HCT 44.2 43.7  MCV 100.7* 100.2*  PLT PLATELET CLUMPS NOTED ON SMEAR, COUNT APPEARS ADEQUATE 119*   Cardiac Enzymes:  Recent Labs Lab 03/31/13 2004  TROPONINI <0.30   BNP (last 3 results)  Recent Labs  08/12/12 1755 08/17/12 0441 08/27/12 1348  PROBNP 5479.0* 6458.0* 8686.0*   CBG: No results found for this basename: GLUCAP,  in the last 168 hours  Recent Results (from the past 240 hour(s))  MRSA PCR SCREENING     Status: None   Collection Time    03/31/13 11:00 PM  Result Value Range Status   MRSA by PCR NEGATIVE  NEGATIVE Final   Comment:            The GeneXpert MRSA Assay (FDA     approved for NASAL specimens     only), is one component of a     comprehensive MRSA colonization     surveillance program. It is not     intended to diagnose MRSA     infection nor to guide or     monitor treatment for     MRSA infections.     Studies: Dg Chest Port 1 View  03/31/2013   CLINICAL DATA:  Short of breath  EXAM: PORTABLE CHEST - 1 VIEW  COMPARISON:  08/27/2012  FINDINGS: Severe cardiomegaly. There is opacity at the right lung base and shift of the mediastinum to the right likely a combination of right pleural  effusion, right lower lobe collapse, and underlying airspace disease. Mild patchy densities at the left base. Vascular congestion. No pneumothorax.  IMPRESSION: Cardiomegaly.  Right basilar lung collapse, consolidation, and right pleural effusion.  Minimal airspace disease at the left base.   Electronically Signed   By: Maryclare Bean M.D.   On: 03/31/2013 20:07    Scheduled Meds: . albuterol  2.5 mg Nebulization Q6H  . digoxin  0.125 mg Oral Daily  . docusate sodium  100 mg Oral BID  . furosemide  60 mg Oral Daily  . guaiFENesin  600 mg Oral BID  . influenza vac split quadrivalent PF  0.5 mL Intramuscular Tomorrow-1000  . levofloxacin (LEVAQUIN) IV  750 mg Intravenous Q24H  . mometasone-formoterol  2 puff Inhalation BID  . pantoprazole  40 mg Oral Daily  . piperacillin-tazobactam (ZOSYN)  IV  3.375 g Intravenous Q8H  . potassium chloride SA  20 mEq Oral Daily  . saccharomyces boulardii  250 mg Oral BID  . simvastatin  20 mg Oral q1800  . sodium chloride  3 mL Intravenous Q12H  . tamsulosin  0.4 mg Oral Daily   Continuous Infusions: . sodium chloride 75 mL/hr at 04/01/13 0700    Principal Problem:   Community acquired pneumonia Active Problems:   Atrial fibrillation, chronic   Arteriosclerotic cardiovascular disease (ASCVD)   Prostate cancer   Aorta aneurysm   Acute respiratory failure   Acute-on-chronic respiratory failure    Time spent:    [facility]  [facility] Pager [phone]. If 7PM-7AM, please contact night-coverage at [email or web address], password [facility] 04/01/2013, 10:20 AM  LOS: 1 day

## 2013-04-01 NOTE — Evaluation (Signed)
Physical Therapy Evaluation Patient Details Name: [name] MRN: [id number] DOB: 10-31-1921 Today's Date: 04/01/2013 Time: 4098-1191 PT Time Calculation (min): 42 min  PT Assessment / Plan / Recommendation History of Present Illness  Pt is admitted for pneumonia.  He has COPD and is O2 dependent on 3 L O2 at home.  He has a hx of CHF and had been on Hospice at one time, but it was d/c'd as he was doing so well.  He lives with his daughter and he reports being independent with all ADLs and ambulates with a 4 wheeled walker.  Clinical Impression   Pt was seen for evaluation.  He was pleasant and cooperative, appears to be fairly close to functional baseline.  His respiratory status is compromised.On 4 L O2, O2 sats ranged from 83%-90% depending on his activity.  The ICU lines also limited his ability to mobilize in the room.  We will continue to see him to increase his activity level and thereby enable him to maintain his ability to function independently at home.    PT Assessment  Patient needs continued PT services    Follow Up Recommendations  No PT follow up    Does the patient have the potential to tolerate intense rehabilitation      Barriers to Discharge        Equipment Recommendations       Recommendations for Other Services     Frequency Min 3X/week    Precautions / Restrictions Precautions Precautions: Fall Restrictions Weight Bearing Restrictions: No   Pertinent Vitals/Pain       Mobility  Bed Mobility Bed Mobility: Supine to Sit Supine to Sit: 6: Modified independent (Device/Increase time);HOB elevated Transfers Transfers: Sit to Stand;Stand to Sit Sit to Stand: 5: Supervision;From bed;With upper extremity assist Stand to Sit: 5: Supervision;To chair/3-in-1;With upper extremity assist Ambulation/Gait Ambulation/Gait Assistance: 5: Supervision Ambulation Distance (Feet): 4 Feet Assistive device: Rolling walker Gait Pattern: Within Functional  Limits Gait velocity: WNL Stairs: No Wheelchair Mobility Wheelchair Mobility: No    Exercises General Exercises - Lower Extremity Ankle Circles/Pumps: AROM;Both;10 reps;Supine Short Arc Quad: AROM;Both;10 reps;Supine Heel Slides: AROM;Both;10 reps;Supine Hip ABduction/ADduction: AROM;Both;10 reps;Supine   PT Diagnosis: Difficulty walking  PT Problem List: Decreased activity tolerance;Decreased mobility;Cardiopulmonary status limiting activity PT Treatment Interventions: Gait training;Functional mobility training;Therapeutic exercise     PT Goals(Current goals can be found in the care plan section) Acute Rehab PT Goals Patient Stated Goal: none stated PT Goal Formulation: With patient Time For Goal Achievement: 04/15/13 Potential to Achieve Goals: Good  Visit Information  Last PT Received On: 04/01/13 History of Present Illness: Pt is admitted for pneumonia.  He has COPD and is O2 dependent on 3 L O2 at home.  He has a hx of CHF and had been on Hospice at one time, but it was d/c'd as he was doing so well.  He lives with his daughter and he reports being independent with all ADLs and ambulates with a 4 wheeled walker.       Prior Functioning  Home Living Family/patient expects to be discharged to:: Private residence Living Arrangements: Children Available Help at Discharge: Family;Available 24 hours/day Type of Home: Mobile home Home Access: Level entry Home Layout: One level Home Equipment: Walker - 4 wheels;Shower seat;Wheelchair - power Prior Function Level of Independence: Independent with assistive device(s) Communication Communication: HOH    Cognition  Cognition Arousal/Alertness: Awake/alert Behavior During Therapy: WFL for tasks assessed/performed Overall Cognitive Status: Within Functional Limits for  tasks assessed    Extremity/Trunk Assessment Lower Extremity Assessment Lower Extremity Assessment: Overall WFL for tasks assessed Cervical / Trunk  Assessment Cervical / Trunk Assessment: Kyphotic   Balance Balance Balance Assessed: No  End of Session PT - End of Session Equipment Utilized During Treatment: Gait belt Activity Tolerance: Patient tolerated treatment well Patient left: in chair;with call bell/phone within reach;with family/visitor present Nurse Communication: Mobility status  GP     Myrlene Broker L 04/01/2013, 11:25 AM

## 2013-04-01 NOTE — Telephone Encounter (Signed)
See other telephone messages

## 2013-04-01 NOTE — Telephone Encounter (Signed)
Spoke with daughter who informed that patient is currently admitted to ICU at [facility].

## 2013-04-01 NOTE — Care Management Note (Unsigned)
    Page 1 of 2   04/02/2013     2:35:37 PM   CARE MANAGEMENT NOTE 04/02/2013  Patient:  [name], [name]   Account Number:  [id number]  Date Initiated:  04/01/2013  Documentation initiated by:  Sharrie Rothman  Subjective/Objective Assessment:   Pt admitted from home with pneumonia. Pt lives with his daughter who provides 24 hour care for the pt. Pt has used [facility] in the past but they are not active in the home at this time. Pt has home O2, shower chair, cane, rollator, electric w/c     Action/Plan:   and lift chair. Pts daughter stated that pt is mod independent with ADL's but tires very easily. They would lke [facility] at discharge for RN, PT, and aide. WIll notify [facility] of referral. No DME at this time. Also refer to [facility].   Anticipated DC Date:  04/05/2013   Anticipated DC Plan:  HOME W HOME HEALTH SERVICES      DC Planning Services  CM consult      [facility] Choice  HOME HEALTH   Choice offered to / List presented to:  C-1 Patient        HH arranged  HH-1 RN  HH-2 PT  HH-10 DISEASE MANAGEMENT  HH-4 NURSE'S AIDE      HH agency  [facility]   Status of service:  In process, will continue to follow Medicare Important Message given?   (If response is "NO", the following Medicare IM given date fields will be blank) Date Medicare IM given:   Date Additional Medicare IM given:    Discharge Disposition:  HOME W HOME HEALTH SERVICES  Per UR Regulation:    If discussed at Long Length of Stay Meetings, dates discussed:    Comments:  04/02/13 1430 Arlyss Queen, RN BSN CM [facility] referral made to Geralynn Ochs, RN and [facility] will keep pt on their services but Marcial Pacas also suggested a referral be made with Quentin Angst, RN, [facility] Liaison. That referral was made to [facility] by phone and Efraim Kaufmann stated that they would follow via EPIC and make contact with pt at discharge.  04/01/13 1420 Arlyss Queen, RN BSN CM

## 2013-04-01 NOTE — Telephone Encounter (Signed)
noted 

## 2013-04-01 NOTE — Progress Notes (Signed)
UR chart review completed.  

## 2013-04-01 NOTE — Evaluation (Signed)
Clinical/Bedside Swallow Evaluation  Patient Details  Name: [name] MRN: [id number] Date of Birth: [birth year]  Today's Date: 04/01/2013 Time: 11:00 - 11:45 AM    Past Medical History:  Past Medical History  Diagnosis Date  . Mitral valve prolapse 2003    MVR in 2003  . Obesity   . Osteoarthritis   . Hypertension   . Hyperlipidemia   . COPD (chronic obstructive pulmonary disease)   . CHF (congestive heart failure) diastolic 10/2010    CXR in 12/2010: Prior CABG, cardiomegaly, vascular redistribution, bibasilar atelectasis, small effusions  . Pneumonia   . Impaired glucose tolerance   . Atrial fibrillation, chronic   . Prostate cancer     elevated PSA  . GERD (gastroesophageal reflux disease)     + hiatal hernia  . Arteriosclerotic cardiovascular disease (ASCVD) 1994    stent to RCA; CABG-2003  . Hard of hearing   . Anemia, iron deficiency   . AAA (abdominal aortic aneurysm)     Fusiform; infrarenal; 4-4.1cm on CT in 07/2008 and 12/2010 by MRI  . Gastric ulcer 2004    2004; upper GI bleed  . Renal cysts, acquired, bilateral     Complex by MRI in 12/2010  . Adenomatous polyps 04/07/2012  . HYPERTENSION 12/17/2007    Subsequently hypotensive and all antihypertensive medication discontinued Lab  03/2012: Mild anemia with H&H-11.8/37.5, MCV-97, ferritin-224, normal CMet ex G-124, alb-3.4   . NSTEMI (non-ST elevated myocardial infarction) 01/18/2011  . Diabetes mellitus, type II 07/13/12    family denies patient is diabetic  . Macrocytosis without anemia 08/13/2012  . Acute systolic congestive heart failure 08/15/2012    EF 35-40%, per Echo  . Aortic stenosis, moderate 08/15/2012  . Pulmonary hypertension due to COPD 08/15/2012    53 mm Hg  . Tricuspid valve regurgitation 08/15/2012  . Retroperitoneal bleed 06/2012   Past Surgical History:  Past Surgical History  Procedure Laterality Date  . Knee surgery      Left  . Bladder surgery    . Femoral artery stent  12-06-10     Left SFA  . Appendectomy    . Mitral valve replacement (mvr)/coronary artery bypass grafting (cabg)  2003    stent to RCA in 1994  . Eye surgery  2007    bilateral cataracts  . Tonsillectomy      thinks they were removed while in the [employer]  . Mass excision  06/07/2011    Procedure: EXCISION MASS;  Surgeon: Fabio Bering;  Location: [facility];  Service: General;  Laterality: N/A;  excision of 2 masses back and buttocks  . Back surgery    . Esophagogastroduodenoscopy  03/03/2003    Large, deep prepyloric ulcer, as described above without bleeding/ Normal esophagus  . Esophagogastroduodenoscopy  09/03/2004    normal throughout  . Colonoscopy  09/03/2004    small ulcer, without stigmata of bleeding  . Esophagogastroduodenoscopy  11/14/2004    Normal esophagus and small hiatal hernia, otherwise normal stomach   . Colonoscopy  11/14/2004    Internal hemorrhoids, otherwise normal rectum/ left-sided diverticula , diffusely oozing right colon mucosa without a discrete lesion amenable to endoscopic therapy. 2 diminutive polyps. FELT TO HAVE AVMs/telangiectasias  . Colonoscopy  01/29/2005    Normal rectum  . Colonoscopy  03/11/2012    Colonic diverticulosis. Colonic polyps-removed as described above. Vascular anomalies in the cecum likely representing hemangiomas. Status post hemostasis clipping of  2 of the 3. ADENOMATOUS POLYPS. Repeat 2016  .  Esophagogastroduodenoscopy  03/11/2012    Deformity of the antrum;  small polyp in antrum-not manipulated. Otherwise normal exam   HPI:  [name] is a 79 y.o. male with a past medical history of advanced COPD on home oxygen, congestive heart failure, history of retroperitoneal hematoma earlier this year, abdominal aortic aneurysm, coronary artery disease, who is accompanied by his daughter today. He lives with his daughter. According to the daughter he has been more short of breath for the last 3 weeks and has noticed to be more hypoxic during that  time. His saturations at home usually are between 90-93%. With exertion in the last 3, weeks they've been dropping to the 70s. Patient has been mildly confused. However, there's been no history of any cough or fever or chest pains. No nausea, vomiting. He's been eating meals regularly. He's had regular bowel movements.Chest x-ray shows right basilar consolidation and pleural effusion with some collapse of the lung. This could be community acquired pneumonia. Aspiration, cannot be ruled out. He also has severe underlying COPD.   Assessment / Plan / Recommendation Clinical Impression  Pt overall swallow function appears within functional limits for regular textures and thin liquids, however pt does have significant respiratory compromise which increases his risk for aspiration. Pt was observed to desat to 88 when talking and when eating. He was encouraged to eat slowly, swallow two times for each bite, and avoid talking while eating/drinking. Pt's daughter reports that she feels that pt does not get enough oxygen at night and is up all night.    Aspiration Risk  Mild    Diet Recommendation Regular;Thin liquid   Liquid Administration via: Cup;Straw Medication Administration: Whole meds with liquid Supervision: Patient able to self feed Compensations: Slow rate;Small sips/bites;Multiple dry swallows after each bite/sip Postural Changes and/or Swallow Maneuvers: Seated upright 90 degrees;Out of bed for meals;Upright 30-60 min after meal    Other  Recommendations Oral Care Recommendations: Oral care BID Other Recommendations: Clarify dietary restrictions   Follow Up Recommendations  None    Frequency and Duration             Swallow Study Prior Functional Status  Type of Home: Mobile home Available Help at Discharge: Family;Available 24 hours/day    General Date of Onset: 03/31/13 HPI: [name] is a 79 y.o. male with a past medical history of advanced COPD on home oxygen,  congestive heart failure, history of retroperitoneal hematoma earlier this year, abdominal aortic aneurysm, coronary artery disease, who is accompanied by his daughter today. He lives with his daughter. According to the daughter he has been more short of breath for the last 3 weeks and has noticed to be more hypoxic during that time. His saturations at home usually are between 90-93%. With exertion in the last 3, weeks they've been dropping to the 70s. Patient has been mildly confused. However, there's been no history of any cough or fever or chest pains. No nausea, vomiting. He's been eating meals regularly. He's had regular bowel movements.Chest x-ray shows right basilar consolidation and pleural effusion with some collapse of the lung. This could be community acquired pneumonia. Aspiration, cannot be ruled out. He also has severe underlying COPD. Type of Study: Bedside swallow evaluation Diet Prior to this Study: Regular;Thin liquids Temperature Spikes Noted: No Respiratory Status: Nasal cannula (4 L) History of Recent Intubation: No Behavior/Cognition: Alert;Cooperative;Pleasant mood Oral Cavity - Dentition: Dentures, top;Dentures, bottom Self-Feeding Abilities: Able to feed self;Needs set up Patient Positioning: Upright in  chair Baseline Vocal Quality: Clear Volitional Cough: Strong Volitional Swallow: Able to elicit    Oral/Motor/Sensory Function Overall Oral Motor/Sensory Function: Appears within functional limits for tasks assessed   Ice Chips Ice chips: Within functional limits Presentation: Spoon   Thin Liquid Thin Liquid: Within functional limits Presentation: Cup;Self Fed;Straw    Nectar Thick Nectar Thick Liquid: Not tested   Honey Thick Honey Thick Liquid: Not tested   Puree Puree: Within functional limits Presentation: Spoon   Solid   GO    Solid: Within functional limits Presentation: Self Fed       PORTER,DABNEY 04/01/2013,11:56 AM

## 2013-04-02 ENCOUNTER — Inpatient Hospital Stay (HOSPITAL_COMMUNITY): Payer: Medicare Other

## 2013-04-02 DIAGNOSIS — I719 Aortic aneurysm of unspecified site, without rupture: Secondary | ICD-10-CM

## 2013-04-02 LAB — CBC
HCT: 43.1 % (ref 39.0–52.0)
MCHC: 31.6 g/dL (ref 30.0–36.0)
MCV: 100.7 fL — ABNORMAL HIGH (ref 78.0–100.0)
Platelets: 113 10*3/uL — ABNORMAL LOW (ref 150–400)
RDW: 14.8 % (ref 11.5–15.5)

## 2013-04-02 LAB — LEGIONELLA ANTIGEN, URINE: Legionella Antigen, Urine: NEGATIVE

## 2013-04-02 LAB — ALBUMIN, FLUID (OTHER): Albumin, Fluid: 1.9 g/dL

## 2013-04-02 LAB — PRO B NATRIURETIC PEPTIDE: Pro B Natriuretic peptide (BNP): 4668 pg/mL — ABNORMAL HIGH (ref 0–450)

## 2013-04-02 LAB — BODY FLUID CELL COUNT WITH DIFFERENTIAL
Eos, Fluid: 0 %
Monocyte-Macrophage-Serous Fluid: 49 % — ABNORMAL LOW (ref 50–90)

## 2013-04-02 LAB — BASIC METABOLIC PANEL
BUN: 22 mg/dL (ref 6–23)
Creatinine, Ser: 1.14 mg/dL (ref 0.50–1.35)
GFR calc Af Amer: 64 mL/min — ABNORMAL LOW (ref >90)
GFR calc non Af Amer: 55 mL/min — ABNORMAL LOW (ref >90)

## 2013-04-02 NOTE — Progress Notes (Signed)
PT TRANSFERING TO ROOM 324. PT ALERT AND MILDLY CONFUSED. DIMINISHED BREATH SOUNDS. O2 AT 4L/MIN VIA [place]. THORACENTESIS SITE CLEAN AND DRY. IV  SITES X 2 PATENT. VOIDS IN URINAL.DAUGHTER AT BEDSIDE. TRANSFER REPORT GIVEN TO CHRISTINA RN ON 300. PT WILL BE TRANSFERRED AFTER HE EATS HIS LUNCH.

## 2013-04-02 NOTE — Progress Notes (Signed)
PT Cancellation Note  Patient Details Name: BLANE WORTHINGTON MRN: [id number] DOB: [birth year]   Cancelled Treatment:    Reason Eval/Treat Not Completed: Medical issues which prohibited therapy Pt had just recently returned from having a thoracentesis.  RN states that he is fatigued from this and recommends that we hold PT for today.  Myrlene Broker L 04/02/2013, 2:22 PM

## 2013-04-02 NOTE — Progress Notes (Signed)
REVIEWED W/ DAUGHTER [name] INFO ON THORACENTESIS. SHE SIGNED CONSENT FOR TP SINCE SHE IS HIS POA.

## 2013-04-02 NOTE — Progress Notes (Signed)
Paracentesis complete no signs of distress. 1200 ml yellow pleural fluid removed. 6.35ml lidocaine 15 given by MD

## 2013-04-02 NOTE — Procedures (Signed)
PreOperative Dx: RIGHTpleural effusion Postoperative Dx: RIGHT pleural effusion Procedure:   US guided RIGHT thoracentesis Radiologist:  Tyron Russell Anesthesia:  6.5 ml of 1 lidocaine Specimen:  1200 ml of yellow colored fluid EBL:   < 1 ml Complications: None

## 2013-04-02 NOTE — Progress Notes (Signed)
Pt transported to ultrasound for thoracentesis via stretcher.

## 2013-04-02 NOTE — Progress Notes (Signed)
Thank you to [facility] for this referral.  Patient has been previously active with [facility] services.  Patient was discharged from services due to his recent SNF admission.  Currently his daughter is his 24 hour caregiver.  Patient is a [facility] HMO member that may benefit from [facility] transition of care support and chronic disease management services.  Requested that [facility] reach out to [facility] Quentin Angst RN at [phone] to engage these services.  He will remain [facility] eligible but will not be engaged at this time.  Of note, [facility] Care Management services does not replace or interfere with any services that are arranged by inpatient case management or social work.  For additional questions or referrals please contact Anibal Henderson BSN RN [facility] Liaison at [phone].

## 2013-04-02 NOTE — Progress Notes (Signed)
[facility] PROGRESS NOTE  [name] WUJ:[id number] DOB: December 23, 1921 DOA: 03/31/2013 PCP: [name], MD  Assessment/Plan: 1. Pneumonia, community-acquired versus aspiration. Patient has been started on Levaquin and Zosyn. His respiratory status appears to be stabilizing. We will continue current treatments, mucolytics. He does have some right lower lobe collapse on chest x-ray. This was further evaluated with CT chest which revealed moderate right sided pleural effusion with associated atelectasis.  Will arrange for thoracentesis today. 2. Acute on chronic respiratory failure. Patient is oxygen dependent COPD. He is requiring more oxygen than baseline. Continue to monitor clinically, appears to be stabilizing. 3. Chronic systolic congestive heart failure. Appears compensated. Will need to be cautious with IV fluids. 4. Atrial fibrillation. Rate controlled. On digoxin. Not candidate for anticoagulation due to retroperitoneal bleeding. 5. History of abdominal aortic aneurysm. Due to his age and multiple comorbidities, it does not appear that he is a candidate for operative repair. Review of primary care notes indicate that the family has also elected not to pursue further operative management.  Code Status: full code Family Communication: discussed with patient and daughter at the bedside Disposition Plan: pending hospital course   Consultants:  none  Procedures:  none  Antibiotics:  Levaquin 10/8  Zosyn 10/8  HPI/Subjective: Reports coughing up a large amount of sputum overnight.  No new complaints.  Objective: Filed Vitals:   04/02/13 0900  BP: 127/61  Pulse: 44  Temp:   Resp:     Intake/Output Summary (Last 24 hours) at 04/02/13 1059 Last data filed at 04/02/13 0700  Gross per 24 hour  Intake   1350 ml  Output    850 ml  Net    500 ml   Filed Weights   03/31/13 1854 03/31/13 2250 04/01/13 0500  Weight: 103.42 kg (228 lb) 104.4 kg (230 lb 2.6 oz)  105 kg (231 lb 7.7 oz)    Exam:   General:  NAD  Cardiovascular: S1, S2 RRR  Respiratory: diminished breath sounds at bases  Abdomen: soft, nt, nd, bs+  Musculoskeletal: no edema b/l   Data Reviewed: Basic Metabolic Panel:  Recent Labs Lab 03/31/13 2004 03/31/13 2129 04/01/13 0459 04/02/13 0509  NA 137  --  138 139  K 4.1  --  3.9 4.0  CL 97  --  98 99  CO2 32  --  32 32  GLUCOSE 142*  --  110* 111*  BUN 24*  --  25* 22  CREATININE 1.12  --  1.08 1.14  CALCIUM 9.1  --  9.0 9.1  MG  --  2.1  --   --    Liver Function Tests:  Recent Labs Lab 04/01/13 0459  AST 26  ALT 14  ALKPHOS 69  BILITOT 0.6  PROT 7.1  ALBUMIN 3.7   No results found for this basename: LIPASE, AMYLASE,  in the last 168 hours No results found for this basename: AMMONIA,  in the last 168 hours CBC:  Recent Labs Lab 03/31/13 2004 04/01/13 0459 04/02/13 0509  WBC 6.3 5.6 6.0  NEUTROABS 4.7  --   --   HGB 13.7 13.5 13.6  HCT 44.2 43.7 43.1  MCV 100.7* 100.2* 100.7*  PLT PLATELET CLUMPS NOTED ON SMEAR, COUNT APPEARS ADEQUATE 119* 113*   Cardiac Enzymes:  Recent Labs Lab 03/31/13 2004  TROPONINI <0.30   BNP (last 3 results)  Recent Labs  08/12/12 1755 08/17/12 0441 08/27/12 1348  PROBNP 5479.0* 6458.0* 8686.0*   CBG: No results  found for this basename: GLUCAP,  in the last 168 hours  Recent Results (from the past 240 hour(s))  MRSA PCR SCREENING     Status: None   Collection Time    03/31/13 11:00 PM      Result Value Range Status   MRSA by PCR NEGATIVE  NEGATIVE Final   Comment:            The GeneXpert MRSA Assay (FDA     approved for NASAL specimens     only), is one component of a     comprehensive MRSA colonization     surveillance program. It is not     intended to diagnose MRSA     infection nor to guide or     monitor treatment for     MRSA infections.     Studies: Ct Chest W Contrast  04/01/2013   CLINICAL DATA:  Shortness of breath for the past  3 weeks. Advanced COPD.  EXAM: CT CHEST WITH CONTRAST  TECHNIQUE: Multidetector CT imaging of the chest was performed during intravenous contrast administration.  CONTRAST:  80mL OMNIPAQUE IOHEXOL 300 MG/ML  SOLN  COMPARISON:  Chest CT 07/21/2012.  FINDINGS: Mediastinum: Heart size is enlarged with marked dilatation of the right atrium, right ventricle and left atrium. Additionally, the right ventricular wall appears hypertrophied, and there is dilatation of the pulmonic trunk (5.4 cm in diameter), strongly indicative of pulmonary arterial hypertension. Straightening of the interventricular septum, suggesting some degree of right-sided heart strain. There is no significant pericardial fluid, thickening or pericardial calcification. There is atherosclerosis of the thoracic aorta, the great vessels of the mediastinum and the coronary arteries, including calcified atherosclerotic plaque in the left main, left anterior descending, left circumflex and right coronary arteries. Status post median sternotomy for CABG including LIMA to the LAD, as well as mitral valve replacement (a stented bioprosthesis is noted). Calcifications of the aortic valve. No pathologically enlarged mediastinal or hilar lymph nodes. Esophagus is unremarkable in appearance.  Lungs/Pleura: Moderate right-sided pleural effusion with near complete passive atelectasis of the right lower lobe and complete passive atelectasis of the right middle lobe as well. Abrupt cut off of bronchus intermedius is noted on images 32-33 of series 3. This appears related to retained secretions, as there is no definite extrinsic compression of the bronchus at this level. Minimal subsegmental atelectasis in the inferior aspect the left lower lobe.  Upper Abdomen: Perihepatic and perisplenic ascites. Low-attenuation lesion in the left upper retroperitoneum measuring at least 4.6 cm in diameter, favored to represent an exophytic left renal cyst. This is similar to the prior  examination.  Musculoskeletal: Sternotomy wires. There are no aggressive appearing lytic or blastic lesions noted in the visualized portions of the skeleton.  IMPRESSION: 1. Cardiomegaly with evidence of pulmonary arterial hypertension and right-sided heart strain, as discussed above. 2. Moderate right-sided pleural effusion with extensive passive atelectasis in the right middle and lower lobes, as above. 3. Ascites. 4. Atherosclerosis, including left main and 3 vessel coronary artery disease. Status post median sternotomy for CABG, including LIMA to the LAD. 5. Additional incidental findings, as above.   Electronically Signed   By: Trudie Reed M.D.   On: 04/01/2013 15:54   Dg Chest Port 1 View  03/31/2013   CLINICAL DATA:  Short of breath  EXAM: PORTABLE CHEST - 1 VIEW  COMPARISON:  08/27/2012  FINDINGS: Severe cardiomegaly. There is opacity at the right lung base and shift of the mediastinum to the  right likely a combination of right pleural effusion, right lower lobe collapse, and underlying airspace disease. Mild patchy densities at the left base. Vascular congestion. No pneumothorax.  IMPRESSION: Cardiomegaly.  Right basilar lung collapse, consolidation, and right pleural effusion.  Minimal airspace disease at the left base.   Electronically Signed   By: Maryclare Bean M.D.   On: 03/31/2013 20:07    Scheduled Meds: . albuterol  2.5 mg Nebulization Q6H  . digoxin  0.125 mg Oral Daily  . docusate sodium  100 mg Oral BID  . furosemide  60 mg Oral Daily  . guaiFENesin  600 mg Oral BID  . levofloxacin (LEVAQUIN) IV  750 mg Intravenous Q24H  . mometasone-formoterol  2 puff Inhalation BID  . pantoprazole  40 mg Oral Daily  . piperacillin-tazobactam (ZOSYN)  IV  3.375 g Intravenous Q8H  . potassium chloride SA  20 mEq Oral Daily  . saccharomyces boulardii  250 mg Oral BID  . simvastatin  20 mg Oral q1800  . sodium chloride  3 mL Intravenous Q12H  . tamsulosin  0.4 mg Oral Daily   Continuous  Infusions:    Principal Problem:   Community acquired pneumonia Active Problems:   Atrial fibrillation, chronic   Arteriosclerotic cardiovascular disease (ASCVD)   Prostate cancer   Aorta aneurysm   Acute respiratory failure   Acute-on-chronic respiratory failure    Time spent:    [employer]  [facility] Pager [phone]. If 7PM-7AM, please contact night-coverage at [email or web address], password [facility] 04/02/2013, 10:59 AM  LOS: 2 days

## 2013-04-03 LAB — BASIC METABOLIC PANEL
BUN: 21 mg/dL (ref 6–23)
Calcium: 9 mg/dL (ref 8.4–10.5)
GFR calc Af Amer: 57 mL/min — ABNORMAL LOW (ref >90)
GFR calc non Af Amer: 49 mL/min — ABNORMAL LOW (ref >90)
Glucose, Bld: 106 mg/dL — ABNORMAL HIGH (ref 70–99)
Potassium: 3.9 mEq/L (ref 3.5–5.1)
Sodium: 138 mEq/L (ref 135–145)

## 2013-04-03 LAB — CBC
HCT: 43.5 % (ref 39.0–52.0)
MCHC: 31.3 g/dL (ref 30.0–36.0)
Platelets: 118 10*3/uL — ABNORMAL LOW (ref 150–400)
RDW: 14.7 % (ref 11.5–15.5)
WBC: 5.7 10*3/uL (ref 4.0–10.5)

## 2013-04-03 MED ORDER — LEVOFLOXACIN 750 MG PO TABS
750.0000 mg | ORAL_TABLET | Freq: Every day | ORAL | Status: DC
  Administered 2013-04-03: 750 mg via ORAL
  Filled 2013-04-03: qty 1

## 2013-04-03 NOTE — Progress Notes (Signed)
ANTIBIOTIC CONSULT NOTE  Pharmacy Consult for Zosyn Indication: pneumonia  Allergies  Allergen Reactions  . Aspirin   . Gabapentin     Patient cannot take over 200 mg dose at a time as it causes jerking or spasms  . Naproxen     Patient Measurements: Height: 6\' 1"  (185.4 cm) Weight: 231 lb 7.7 oz (105 kg) IBW/kg (Calculated) : 79.9   Vital Signs: Temp: 97.7 F (36.5 C) (10/11 0441) Temp src: Oral (10/11 0441) BP: 117/64 mmHg (10/11 0441) Pulse Rate: 90 (10/11 0441) Intake/Output from previous day: 10/10 0701 - 10/11 0700 In: 810 [P.O.:510; IV Piggyback:300] Out: 900 [Urine:900] Intake/Output from this shift:    Labs:  Recent Labs  04/01/13 0459 04/02/13 0509 04/03/13 0625  WBC 5.6 6.0 5.7  HGB 13.5 13.6 13.6  PLT 119* 113* 118*  CREATININE 1.08 1.14 1.25   Estimated Creatinine Clearance: 50.9 ml/min (by C-G formula based on Cr of 1.25). No results found for this basename: VANCOTROUGH, Leodis Binet, VANCORANDOM, GENTTROUGH, GENTPEAK, GENTRANDOM, TOBRATROUGH, TOBRAPEAK, TOBRARND, AMIKACINPEAK, AMIKACINTROU, AMIKACIN,  in the last 72 hours   Microbiology: Recent Results (from the past 720 hour(s))  MRSA PCR SCREENING     Status: None   Collection Time    03/31/13 11:00 PM      Result Value Range Status   MRSA by PCR NEGATIVE  NEGATIVE Final   Comment:            The GeneXpert MRSA Assay (FDA     approved for NASAL specimens     only), is one component of a     comprehensive MRSA colonization     surveillance program. It is not     intended to diagnose MRSA     infection nor to guide or     monitor treatment for     MRSA infections.  BODY FLUID CULTURE     Status: None   Collection Time    04/02/13 12:05 PM      Result Value Range Status   Specimen Description FLUID RIGHT PLEURAL   Final   Special Requests NONE   Final   Gram Stain     Final   Value: FEW WBC PRESENT,BOTH PMN AND MONONUCLEAR     NO ORGANISMS SEEN     Performed at [facility]    Culture PENDING   Incomplete   Report Status PENDING   Incomplete    Medical History: Past Medical History  Diagnosis Date  . Mitral valve prolapse 2003    MVR in 2003  . Obesity   . Osteoarthritis   . Hypertension   . Hyperlipidemia   . COPD (chronic obstructive pulmonary disease)   . CHF (congestive heart failure) diastolic 10/2010    CXR in 12/2010: Prior CABG, cardiomegaly, vascular redistribution, bibasilar atelectasis, small effusions  . Pneumonia   . Impaired glucose tolerance   . Atrial fibrillation, chronic   . Prostate cancer     elevated PSA  . GERD (gastroesophageal reflux disease)     + hiatal hernia  . Arteriosclerotic cardiovascular disease (ASCVD) 1994    stent to RCA; CABG-2003  . Hard of hearing   . Anemia, iron deficiency   . AAA (abdominal aortic aneurysm)     Fusiform; infrarenal; 4-4.1cm on CT in 07/2008 and 12/2010 by MRI  . Gastric ulcer 2004    2004; upper GI bleed  . Renal cysts, acquired, bilateral     Complex by MRI in 12/2010  .  Adenomatous polyps 04/07/2012  . HYPERTENSION 12/17/2007    Subsequently hypotensive and all antihypertensive medication discontinued Lab  03/2012: Mild anemia with H&H-11.8/37.5, MCV-97, ferritin-224, normal CMet ex G-124, alb-3.4   . NSTEMI (non-ST elevated myocardial infarction) 01/18/2011  . Diabetes mellitus, type II 07/13/12    family denies patient is diabetic  . Macrocytosis without anemia 08/13/2012  . Acute systolic congestive heart failure 08/15/2012    EF 35-40%, per Echo  . Aortic stenosis, moderate 08/15/2012  . Pulmonary hypertension due to COPD 08/15/2012    53 mm Hg  . Tricuspid valve regurgitation 08/15/2012  . Retroperitoneal bleed 06/2012    Medications:  Scheduled:  . albuterol  2.5 mg Nebulization Q6H  . digoxin  0.125 mg Oral Daily  . docusate sodium  100 mg Oral BID  . furosemide  60 mg Oral Daily  . guaiFENesin  600 mg Oral BID  . levofloxacin (LEVAQUIN) IV  750 mg Intravenous Q24H  .  mometasone-formoterol  2 puff Inhalation BID  . pantoprazole  40 mg Oral Daily  . piperacillin-tazobactam (ZOSYN)  IV  3.375 g Intravenous Q8H  . potassium chloride SA  20 mEq Oral Daily  . saccharomyces boulardii  250 mg Oral BID  . simvastatin  20 mg Oral q1800  . sodium chloride  3 mL Intravenous Q12H  . tamsulosin  0.4 mg Oral Daily   Assessment: 79 yo M with community-acquired vs. aspiration PNA.  His respiratory status has improved.  Scr continues to increase.   Levaquin 10/8>> Zosyn 10/8>>  Goal of Therapy:  Eradicate infection.  Plan:  Continue Zosyn 3.375gm IV every 8 hours.  Recommend d/c Zosyn at this point.  Consider Flagyl 500mg  tid if still concerned about anaerobic coverage.  Change Levaquin to 750mg  po q24h Follow-up micro data, labs, vitals. Duration of therapy per MD  Elson Clan 04/03/2013,8:56 AM

## 2013-04-03 NOTE — Progress Notes (Signed)
[facility] PROGRESS NOTE  DAIWIK BUFFALO ZOX:[id number] DOB: 1922/02/01 DOA: 03/31/2013 PCP: [name], MD  Assessment/Plan: 1. Pneumonia, community-acquired versus aspiration. Patient has been on Levaquin and Zosyn. His respiratory status appears to be improving. Will de-escalate antibiotics to levaquin, mucolytics.  2. Right sided pleural effusion, s/p thoracentesis with removal of 1200cc of fluid.  Appears to be transudative, likely due to CHF.  Will continue to monitor resp status.  3. Acute on chronic respiratory failure. Patient is oxygen dependent COPD. Weaned down to his home dose of 3L of oxygen.  Will ambulate today. 4. Chronic systolic congestive heart failure. Appears compensated. Will need to be cautious with IV fluids. Continue po lasix 5. Atrial fibrillation. Rate controlled. On digoxin. Not candidate for anticoagulation due to retroperitoneal bleeding. 6. History of abdominal aortic aneurysm. Due to his age and multiple comorbidities, it does not appear that he is a candidate for operative repair. Review of primary care notes indicate that the family has also elected not to pursue further operative management.  Code Status: full code Family Communication: discussed with patient and daughter at the bedside Disposition Plan: pending hospital course   Consultants:  none  Procedures:  Thoracentesis 10/10  Antibiotics:  Levaquin 10/8  Zosyn 10/8 >> 10/11  HPI/Subjective: Breathing feels like it is improving, continues to cough up sputum  Objective: Filed Vitals:   04/03/13 1022  BP:   Pulse: 88  Temp:   Resp:     Intake/Output Summary (Last 24 hours) at 04/03/13 1235 Last data filed at 04/03/13 0649  Gross per 24 hour  Intake    810 ml  Output    750 ml  Net     60 ml   Filed Weights   03/31/13 1854 03/31/13 2250 04/01/13 0500  Weight: 103.42 kg (228 lb) 104.4 kg (230 lb 2.6 oz) 105 kg (231 lb 7.7 oz)    Exam:   General:   NAD  Cardiovascular: S1, S2 RRR  Respiratory: diminished breath sounds at bases  Abdomen: soft, nt, nd, bs+  Musculoskeletal: no edema b/l   Data Reviewed: Basic Metabolic Panel:  Recent Labs Lab 03/31/13 2004 03/31/13 2129 04/01/13 0459 04/02/13 0509 04/03/13 0625  NA 137  --  138 139 138  K 4.1  --  3.9 4.0 3.9  CL 97  --  98 99 97  CO2 32  --  32 32 34*  GLUCOSE 142*  --  110* 111* 106*  BUN 24*  --  25* 22 21  CREATININE 1.12  --  1.08 1.14 1.25  CALCIUM 9.1  --  9.0 9.1 9.0  MG  --  2.1  --   --   --    Liver Function Tests:  Recent Labs Lab 04/01/13 0459  AST 26  ALT 14  ALKPHOS 69  BILITOT 0.6  PROT 7.1  ALBUMIN 3.7   No results found for this basename: LIPASE, AMYLASE,  in the last 168 hours No results found for this basename: AMMONIA,  in the last 168 hours CBC:  Recent Labs Lab 03/31/13 2004 04/01/13 0459 04/02/13 0509 04/03/13 0625  WBC 6.3 5.6 6.0 5.7  NEUTROABS 4.7  --   --   --   HGB 13.7 13.5 13.6 13.6  HCT 44.2 43.7 43.1 43.5  MCV 100.7* 100.2* 100.7* 100.7*  PLT PLATELET CLUMPS NOTED ON SMEAR, COUNT APPEARS ADEQUATE 119* 113* 118*   Cardiac Enzymes:  Recent Labs Lab 03/31/13 2004  TROPONINI <0.30  BNP (last 3 results)  Recent Labs  08/17/12 0441 08/27/12 1348 04/02/13 1123  PROBNP 6458.0* 8686.0* 4668.0*   CBG: No results found for this basename: GLUCAP,  in the last 168 hours  Recent Results (from the past 240 hour(s))  MRSA PCR SCREENING     Status: None   Collection Time    03/31/13 11:00 PM      Result Value Range Status   MRSA by PCR NEGATIVE  NEGATIVE Final   Comment:            The GeneXpert MRSA Assay (FDA     approved for NASAL specimens     only), is one component of a     comprehensive MRSA colonization     surveillance program. It is not     intended to diagnose MRSA     infection nor to guide or     monitor treatment for     MRSA infections.  BODY FLUID CULTURE     Status: None   Collection  Time    04/02/13 12:05 PM      Result Value Range Status   Specimen Description FLUID RIGHT PLEURAL   Final   Special Requests NONE   Final   Gram Stain     Final   Value: FEW WBC PRESENT,BOTH PMN AND MONONUCLEAR     NO ORGANISMS SEEN     Performed at [facility]   Culture PENDING   Incomplete   Report Status PENDING   Incomplete     Studies: Dg Chest 1 View  04/02/2013   CLINICAL DATA:  Right pleural effusion post thoracentesis  EXAM: CHEST - 1 VIEW  COMPARISON:  Expiratory AP exam compared to 03/31/2013  FINDINGS: Enlargement of cardiac silhouette post median sternotomy.  Atherosclerotic calcification aorta.  Pulmonary vascular congestion.  Persistent right basilar effusion despite removal of 1200 mL of fluid during thoracentesis.  No left pleural effusion identified.  Mild right basilar atelectasis.  Lungs otherwise clear.  No pneumothorax.  IMPRESSION: No pneumothorax following right thoracentesis.  Small right pleural effusion and basilar atelectasis.   Electronically Signed   By: Ulyses Southward M.D.   On: 04/02/2013 12:27   Ct Chest W Contrast  04/01/2013   CLINICAL DATA:  Shortness of breath for the past 3 weeks. Advanced COPD.  EXAM: CT CHEST WITH CONTRAST  TECHNIQUE: Multidetector CT imaging of the chest was performed during intravenous contrast administration.  CONTRAST:  80mL OMNIPAQUE IOHEXOL 300 MG/ML  SOLN  COMPARISON:  Chest CT 07/21/2012.  FINDINGS: Mediastinum: Heart size is enlarged with marked dilatation of the right atrium, right ventricle and left atrium. Additionally, the right ventricular wall appears hypertrophied, and there is dilatation of the pulmonic trunk (5.4 cm in diameter), strongly indicative of pulmonary arterial hypertension. Straightening of the interventricular septum, suggesting some degree of right-sided heart strain. There is no significant pericardial fluid, thickening or pericardial calcification. There is atherosclerosis of the thoracic aorta, the  great vessels of the mediastinum and the coronary arteries, including calcified atherosclerotic plaque in the left main, left anterior descending, left circumflex and right coronary arteries. Status post median sternotomy for CABG including LIMA to the LAD, as well as mitral valve replacement (a stented bioprosthesis is noted). Calcifications of the aortic valve. No pathologically enlarged mediastinal or hilar lymph nodes. Esophagus is unremarkable in appearance.  Lungs/Pleura: Moderate right-sided pleural effusion with near complete passive atelectasis of the right lower lobe and complete passive atelectasis of the right  middle lobe as well. Abrupt cut off of bronchus intermedius is noted on images 32-33 of series 3. This appears related to retained secretions, as there is no definite extrinsic compression of the bronchus at this level. Minimal subsegmental atelectasis in the inferior aspect the left lower lobe.  Upper Abdomen: Perihepatic and perisplenic ascites. Low-attenuation lesion in the left upper retroperitoneum measuring at least 4.6 cm in diameter, favored to represent an exophytic left renal cyst. This is similar to the prior examination.  Musculoskeletal: Sternotomy wires. There are no aggressive appearing lytic or blastic lesions noted in the visualized portions of the skeleton.  IMPRESSION: 1. Cardiomegaly with evidence of pulmonary arterial hypertension and right-sided heart strain, as discussed above. 2. Moderate right-sided pleural effusion with extensive passive atelectasis in the right middle and lower lobes, as above. 3. Ascites. 4. Atherosclerosis, including left main and 3 vessel coronary artery disease. Status post median sternotomy for CABG, including LIMA to the LAD. 5. Additional incidental findings, as above.   Electronically Signed   By: Trudie Reed M.D.   On: 04/01/2013 15:54   US Thoracentesis Asp Pleural Space W/img Guide  04/02/2013   CLINICAL DATA:  Right pleural effusion,  thoracentesis  EXAM: RADIOLOGY EXAMINATION  TECHNIQUE: Procedure, benefits, and risks of procedure were discussed with patient.  Written informed consent for procedure was obtained.  Time out protocol followed.  Pleural effusion localized at the posterior right hemi thorax.  Skin prepped and draped in usual sterile fashion.  Skin and soft tissues anesthetized with 6.5 mL of 1% lidocaine.  8 French thoracentesis catheter placed into the right pleural space.  1200 mL of clear yellow fluid aspirated by syringe pump.  Procedure tolerated well by patient without immediate complication.  COMPARISON:  Chest radiograph 03/31/2013, CT chest 04/01/2013  FINDINGS: As above  IMPRESSION: Right thoracentesis as above.  Fluid sent to laboratory for requested analysis.   Electronically Signed   By: Ulyses Southward M.D.   On: 04/02/2013 12:32    Scheduled Meds: . albuterol  2.5 mg Nebulization Q6H  . digoxin  0.125 mg Oral Daily  . docusate sodium  100 mg Oral BID  . furosemide  60 mg Oral Daily  . guaiFENesin  600 mg Oral BID  . levofloxacin  750 mg Oral QHS  . mometasone-formoterol  2 puff Inhalation BID  . pantoprazole  40 mg Oral Daily  . piperacillin-tazobactam (ZOSYN)  IV  3.375 g Intravenous Q8H  . potassium chloride SA  20 mEq Oral Daily  . saccharomyces boulardii  250 mg Oral BID  . simvastatin  20 mg Oral q1800  . sodium chloride  3 mL Intravenous Q12H  . tamsulosin  0.4 mg Oral Daily   Continuous Infusions:    Principal Problem:   Community acquired pneumonia Active Problems:   Atrial fibrillation, chronic   Arteriosclerotic cardiovascular disease (ASCVD)   Prostate cancer   Aorta aneurysm   Acute respiratory failure   Acute-on-chronic respiratory failure    Time spent:    [facility]  [facility] Pager [phone]. If 7PM-7AM, please contact night-coverage at [email or web address], password [facility] 04/03/2013, 12:35 PM  LOS: 3 days

## 2013-04-04 MED ORDER — LEVOFLOXACIN 750 MG PO TABS: 750.0000 mg | ORAL_TABLET | Freq: Every day | ORAL | Status: DC

## 2013-04-04 NOTE — Discharge Summary (Addendum)
Physician Discharge Summary  OSLO HUNTSMAN BJY:[id number] DOB: February 04, 1922 DOA: 03/31/2013  PCP: [name], MD  Admit date: 03/31/2013 Discharge date: 04/04/2013  Time spent: 40 minutes  Recommendations for Outpatient Follow-up:  1. Follow up with primary care doctor as scheduled 2. Repeat CBC on follow up to monitor thrombocytopenia 3. Repeat chest xray to ensure resolution of consolidation in LLL  Discharge Diagnoses:  Principal Problem:   Community acquired pneumonia Active Problems:   Atrial fibrillation, chronic   Arteriosclerotic cardiovascular disease (ASCVD)   Prostate cancer   Aorta aneurysm   Acute respiratory failure   Acute-on-chronic respiratory failure  Acute on chronic systolic CHF  Discharge Condition: improved  Diet recommendation: low salt, low carb  Filed Weights   03/31/13 2250 04/01/13 0500 04/04/13 0518  Weight: 104.4 kg (230 lb 2.6 oz) 105 kg (231 lb 7.7 oz) 107.5 kg (236 lb 15.9 oz)    History of present illness:  [name] is a 79 y.o. male with a past medical history of advanced COPD on home oxygen, congestive heart failure, history of retroperitoneal hematoma earlier this year, abdominal aortic aneurysm, coronary artery disease, who is accompanied by his daughter today. He lives with his daughter. According to the daughter he has been more short of breath for the last 3 weeks and has noticed to be more hypoxic during that time. His saturations at home usually are between 90-93%. With exertion in the last 3, weeks they've been dropping to the 70s. Patient has been mildly confused. However, there's been no history of any cough or fever or chest pains. No nausea, vomiting. He's been eating meals regularly. He's had regular bowel movements. Patient is confused at this time, and unable to provide much history. He was last hospitalized in March and at that time he was sent home with home hospice. However, during the past many months patient has  felt better, so hospice services were discontinued. He also had his CODE STATUS changed according to the daughter to [facility] Course:  This patient was admitted to the hospital with acute on chronic respiratory failure felt to be multifactorial. Patient then had acute acquired pneumonia as well as a right-sided pleural effusion which is likely due to his congestive heart failure. He was admitted to the step down unit, started on IV antibiotics. His left pleural effusion was drained by radiology, where 1200 cc of fluid was removed. This appeared to be transudative effusion, likely secondary to CHF. Patient has known systolic dysfunction with EF of 35%, but was not started on ACE due to borderline blood pressures.  This can be re-evaluated in the outpatient setting.  Throughout his hospital course, patient clinically improved. He is currently back to 3 L of oxygen, which is what he wears home. He is afebrile and blood work is at baseline. His family feels that they will able to manage him at home. He has been set up with home health services. Patient appears to be approaching baseline and will be discharged home today.  Procedures:  Thoracentesis by radiology with 1200cc of fluid removed  Consultations:  none  Discharge Exam: Filed Vitals:   04/04/13 0824  BP:   Pulse: 72  Temp:   Resp: 16    General: NAD Cardiovascular: S1, S2 irregular Respiratory: coarse crackles at bases b/l  Discharge Instructions  Discharge Orders   Future Appointments Provider Department Dept Phone   04/12/2013 1:15 PM Kerri Perches, MD [facility] [phone]  05/05/2013 11:00 AM [facility] [phone]   07/02/2013 11:00 AM [facility] [phone]   07/05/2013 11:30 AM Ellouise Newer, PA-C [facility] [phone]   Future Orders Complete By Expires   (HEART FAILURE PATIENTS) Call MD:  Anytime you have any of  the following symptoms: 1) 3 pound weight gain in 24 hours or 5 pounds in 1 week 2) shortness of breath, with or without a dry hacking cough 3) swelling in the hands, feet or stomach 4) if you have to sleep on extra pillows at night in order to breathe.  As directed    Call MD for:  difficulty breathing, headache or visual disturbances  As directed    Call MD for:  temperature >100.4  As directed    Diet - low sodium heart healthy  As directed    Face-to-face encounter (required for Medicare/Medicaid patients)  As directed    Comments:     I MEMON,JEHANZEB certify that this patient is under my care and that I, or a nurse practitioner or physician's assistant working with me, had a face-to-face encounter that meets the physician face-to-face encounter requirements with this patient on 04/04/2013. The encounter with the patient was in whole, or in part for the following medical condition(s) which is the primary reason for home health care (List medical condition): Patient admitted with pneumonia.  Also has systolic CHF, COPD on home oxygen.  Deconditoned.  Would benefit from home health RN, PT and aide   Questions:     The encounter with the patient was in whole, or in part, for the following medical condition, which is the primary reason for home health care:  pneumonia   I certify that, based on my findings, the following services are medically necessary home health services:  Nursing   Physical therapy   My clinical findings support the need for the above services:  Shortness of breath with activity   Further, I certify that my clinical findings support that this patient is homebound due to:  Shortness of Breath with activity   Reason for Medically Necessary Home Health Services:  Skilled Nursing- Teaching of Disease Process/Symptom Management   Home Health  As directed    Questions:     To provide the following care/treatments:  PT   RN   Home Health Aide   Increase activity slowly  As directed         Medication List         albuterol (2.5 MG/3ML) 0.083% nebulizer solution  Commonly known as:  PROVENTIL  Take 2.5 mg by nebulization every 6 (six) hours as needed for wheezing or shortness of breath.     CENTRUM SILVER tablet  Take 1 tablet by mouth daily.     digoxin 0.125 MG tablet  Commonly known as:  LANOXIN  Take 0.125 mg by mouth daily.     Fluticasone-Salmeterol 100-50 MCG/DOSE Aepb  Commonly known as:  ADVAIR  Inhale 1 puff into the lungs every 12 (twelve) hours.     furosemide 20 MG tablet  Commonly known as:  LASIX  Take 60 mg by mouth daily.     guaifenesin 400 MG Tabs tablet  Commonly known as:  HUMIBID E  Take 400 mg by mouth every 4 (four) hours as needed. Cough/congestion     levofloxacin 750 MG tablet  Commonly known as:  LEVAQUIN  Take 1 tablet (750 mg total) by mouth at bedtime.  lovastatin 40 MG tablet  Commonly known as:  MEVACOR  Take 40 mg by mouth 4 (four) times a week. Patient takes on Sunday,Monday,Wednesday,Friday     omeprazole 40 MG capsule  Commonly known as:  PRILOSEC  Take 40 mg by mouth daily.     potassium chloride SA 20 MEQ tablet  Commonly known as:  K-DUR,KLOR-CON  Take 20 mEq by mouth 2 (two) times daily.     tamsulosin 0.4 MG Caps capsule  Commonly known as:  FLOMAX  Take 0.4 mg by mouth daily.       Allergies  Allergen Reactions  . Aspirin   . Gabapentin     Patient cannot take over 200 mg dose at a time as it causes jerking or spasms  . Naproxen        Follow-up Information   Follow up with [name], MD. Schedule an appointment as soon as possible for a visit in 2 weeks.   Specialty:  Family Medicine   Contact information:   [address], [address] [place] [address] [phone]        The results of significant diagnostics from this hospitalization (including imaging, microbiology, ancillary and laboratory) are listed below for reference.    Significant Diagnostic Studies: Dg  Chest 1 View  04/02/2013   CLINICAL DATA:  Right pleural effusion post thoracentesis  EXAM: CHEST - 1 VIEW  COMPARISON:  Expiratory AP exam compared to 03/31/2013  FINDINGS: Enlargement of cardiac silhouette post median sternotomy.  Atherosclerotic calcification aorta.  Pulmonary vascular congestion.  Persistent right basilar effusion despite removal of 1200 mL of fluid during thoracentesis.  No left pleural effusion identified.  Mild right basilar atelectasis.  Lungs otherwise clear.  No pneumothorax.  IMPRESSION: No pneumothorax following right thoracentesis.  Small right pleural effusion and basilar atelectasis.   Electronically Signed   By: Ulyses Southward M.D.   On: 04/02/2013 12:27   Ct Chest W Contrast  04/01/2013   CLINICAL DATA:  Shortness of breath for the past 3 weeks. Advanced COPD.  EXAM: CT CHEST WITH CONTRAST  TECHNIQUE: Multidetector CT imaging of the chest was performed during intravenous contrast administration.  CONTRAST:  80mL OMNIPAQUE IOHEXOL 300 MG/ML  SOLN  COMPARISON:  Chest CT 07/21/2012.  FINDINGS: Mediastinum: Heart size is enlarged with marked dilatation of the right atrium, right ventricle and left atrium. Additionally, the right ventricular wall appears hypertrophied, and there is dilatation of the pulmonic trunk (5.4 cm in diameter), strongly indicative of pulmonary arterial hypertension. Straightening of the interventricular septum, suggesting some degree of right-sided heart strain. There is no significant pericardial fluid, thickening or pericardial calcification. There is atherosclerosis of the thoracic aorta, the great vessels of the mediastinum and the coronary arteries, including calcified atherosclerotic plaque in the left main, left anterior descending, left circumflex and right coronary arteries. Status post median sternotomy for CABG including LIMA to the LAD, as well as mitral valve replacement (a stented bioprosthesis is noted). Calcifications of the aortic valve. No  pathologically enlarged mediastinal or hilar lymph nodes. Esophagus is unremarkable in appearance.  Lungs/Pleura: Moderate right-sided pleural effusion with near complete passive atelectasis of the right lower lobe and complete passive atelectasis of the right middle lobe as well. Abrupt cut off of bronchus intermedius is noted on images 32-33 of series 3. This appears related to retained secretions, as there is no definite extrinsic compression of the bronchus at this level. Minimal subsegmental atelectasis in the inferior aspect the left lower  lobe.  Upper Abdomen: Perihepatic and perisplenic ascites. Low-attenuation lesion in the left upper retroperitoneum measuring at least 4.6 cm in diameter, favored to represent an exophytic left renal cyst. This is similar to the prior examination.  Musculoskeletal: Sternotomy wires. There are no aggressive appearing lytic or blastic lesions noted in the visualized portions of the skeleton.  IMPRESSION: 1. Cardiomegaly with evidence of pulmonary arterial hypertension and right-sided heart strain, as discussed above. 2. Moderate right-sided pleural effusion with extensive passive atelectasis in the right middle and lower lobes, as above. 3. Ascites. 4. Atherosclerosis, including left main and 3 vessel coronary artery disease. Status post median sternotomy for CABG, including LIMA to the LAD. 5. Additional incidental findings, as above.   Electronically Signed   By: Trudie Reed M.D.   On: 04/01/2013 15:54   Dg Chest Port 1 View  03/31/2013   CLINICAL DATA:  Short of breath  EXAM: PORTABLE CHEST - 1 VIEW  COMPARISON:  08/27/2012  FINDINGS: Severe cardiomegaly. There is opacity at the right lung base and shift of the mediastinum to the right likely a combination of right pleural effusion, right lower lobe collapse, and underlying airspace disease. Mild patchy densities at the left base. Vascular congestion. No pneumothorax.  IMPRESSION: Cardiomegaly.  Right basilar lung  collapse, consolidation, and right pleural effusion.  Minimal airspace disease at the left base.   Electronically Signed   By: Maryclare Bean M.D.   On: 03/31/2013 20:07   US Thoracentesis Asp Pleural Space W/img Guide  04/02/2013   CLINICAL DATA:  Right pleural effusion, thoracentesis  EXAM: RADIOLOGY EXAMINATION  TECHNIQUE: Procedure, benefits, and risks of procedure were discussed with patient.  Written informed consent for procedure was obtained.  Time out protocol followed.  Pleural effusion localized at the posterior right hemi thorax.  Skin prepped and draped in usual sterile fashion.  Skin and soft tissues anesthetized with 6.5 mL of 1% lidocaine.  8 French thoracentesis catheter placed into the right pleural space.  1200 mL of clear yellow fluid aspirated by syringe pump.  Procedure tolerated well by patient without immediate complication.  COMPARISON:  Chest radiograph 03/31/2013, CT chest 04/01/2013  FINDINGS: As above  IMPRESSION: Right thoracentesis as above.  Fluid sent to laboratory for requested analysis.   Electronically Signed   By: Ulyses Southward M.D.   On: 04/02/2013 12:32    Microbiology: Recent Results (from the past 240 hour(s))  MRSA PCR SCREENING     Status: None   Collection Time    03/31/13 11:00 PM      Result Value Range Status   MRSA by PCR NEGATIVE  NEGATIVE Final   Comment:            The GeneXpert MRSA Assay (FDA     approved for NASAL specimens     only), is one component of a     comprehensive MRSA colonization     surveillance program. It is not     intended to diagnose MRSA     infection nor to guide or     monitor treatment for     MRSA infections.  BODY FLUID CULTURE     Status: None   Collection Time    04/02/13 12:05 PM      Result Value Range Status   Specimen Description FLUID RIGHT PLEURAL   Final   Special Requests NONE   Final   Gram Stain     Final   Value: FEW WBC PRESENT,BOTH PMN  AND MONONUCLEAR     NO ORGANISMS SEEN     Performed at [facility]   Culture     Final   Value: NO GROWTH 1 DAY     Performed at [facility]   Report Status PENDING   Incomplete     Labs: Basic Metabolic Panel:  Recent Labs Lab 03/31/13 2004 03/31/13 2129 04/01/13 0459 04/02/13 0509 04/03/13 0625  NA 137  --  138 139 138  K 4.1  --  3.9 4.0 3.9  CL 97  --  98 99 97  CO2 32  --  32 32 34*  GLUCOSE 142*  --  110* 111* 106*  BUN 24*  --  25* 22 21  CREATININE 1.12  --  1.08 1.14 1.25  CALCIUM 9.1  --  9.0 9.1 9.0  MG  --  2.1  --   --   --    Liver Function Tests:  Recent Labs Lab 04/01/13 0459  AST 26  ALT 14  ALKPHOS 69  BILITOT 0.6  PROT 7.1  ALBUMIN 3.7   No results found for this basename: LIPASE, AMYLASE,  in the last 168 hours No results found for this basename: AMMONIA,  in the last 168 hours CBC:  Recent Labs Lab 03/31/13 2004 04/01/13 0459 04/02/13 0509 04/03/13 0625  WBC 6.3 5.6 6.0 5.7  NEUTROABS 4.7  --   --   --   HGB 13.7 13.5 13.6 13.6  HCT 44.2 43.7 43.1 43.5  MCV 100.7* 100.2* 100.7* 100.7*  PLT PLATELET CLUMPS NOTED ON SMEAR, COUNT APPEARS ADEQUATE 119* 113* 118*   Cardiac Enzymes:  Recent Labs Lab 03/31/13 2004  TROPONINI <0.30   BNP: BNP (last 3 results)  Recent Labs  08/17/12 0441 08/27/12 1348 04/02/13 1123  PROBNP 6458.0* 8686.0* 4668.0*   CBG: No results found for this basename: GLUCAP,  in the last 168 hours     Signed:  MEMON,JEHANZEB  [facility] 04/04/2013, 12:06 PM

## 2013-04-04 NOTE — Progress Notes (Signed)
Patient with orders to be discharge with home health. Discharge instructions given, patient and daughter verbalized understanding. Called [facility] and faxed face sheet and discharge summary. Patient stable. Patient left with daughter in private vehicle.

## 2013-04-05 LAB — PATHOLOGIST SMEAR REVIEW

## 2013-04-06 LAB — BODY FLUID CULTURE

## 2013-04-07 ENCOUNTER — Telehealth: Payer: Self-pay | Admitting: Family Medicine

## 2013-04-08 NOTE — Telephone Encounter (Signed)
Daughter would like a referral to [facility] because the home health can only come out 3 times per insurance.  She is also asking about sleep study for CPAP.  Notified daughter that we are in the process of looking for a solution for sleep study and I will be back in touch next week.   Referral for [facility].

## 2013-04-08 NOTE — Telephone Encounter (Signed)
Ok to refer her to [facility]

## 2013-04-09 NOTE — Telephone Encounter (Signed)
Referred to [facility] 

## 2013-04-12 ENCOUNTER — Ambulatory Visit: Payer: Medicare Other | Admitting: Family Medicine

## 2013-04-13 ENCOUNTER — Emergency Department (HOSPITAL_COMMUNITY): Payer: Medicare Other

## 2013-04-13 ENCOUNTER — Inpatient Hospital Stay (HOSPITAL_COMMUNITY): Payer: Medicare Other

## 2013-04-13 ENCOUNTER — Inpatient Hospital Stay (HOSPITAL_COMMUNITY)
Admission: EM | Admit: 2013-04-13 | Discharge: 2013-04-16 | DRG: 371 | Disposition: A | Discharge status: Home Health | Payer: Medicare Other | Attending: Internal Medicine | Admitting: Internal Medicine

## 2013-04-13 ENCOUNTER — Encounter (HOSPITAL_COMMUNITY): Payer: Self-pay | Admitting: Emergency Medicine

## 2013-04-13 DIAGNOSIS — R197 Diarrhea, unspecified: Secondary | ICD-10-CM

## 2013-04-13 DIAGNOSIS — J4489 Other specified chronic obstructive pulmonary disease: Secondary | ICD-10-CM | POA: Diagnosis present

## 2013-04-13 DIAGNOSIS — D509 Iron deficiency anemia, unspecified: Secondary | ICD-10-CM | POA: Diagnosis present

## 2013-04-13 DIAGNOSIS — R531 Weakness: Secondary | ICD-10-CM

## 2013-04-13 DIAGNOSIS — G9340 Encephalopathy, unspecified: Secondary | ICD-10-CM | POA: Diagnosis present

## 2013-04-13 DIAGNOSIS — Z823 Family history of stroke: Secondary | ICD-10-CM

## 2013-04-13 DIAGNOSIS — E872 Acidosis, unspecified: Secondary | ICD-10-CM | POA: Diagnosis present

## 2013-04-13 DIAGNOSIS — J9610 Chronic respiratory failure, unspecified whether with hypoxia or hypercapnia: Secondary | ICD-10-CM

## 2013-04-13 DIAGNOSIS — I252 Old myocardial infarction: Secondary | ICD-10-CM

## 2013-04-13 DIAGNOSIS — I5042 Chronic combined systolic (congestive) and diastolic (congestive) heart failure: Secondary | ICD-10-CM | POA: Diagnosis present

## 2013-04-13 DIAGNOSIS — Z951 Presence of aortocoronary bypass graft: Secondary | ICD-10-CM

## 2013-04-13 DIAGNOSIS — R109 Unspecified abdominal pain: Secondary | ICD-10-CM

## 2013-04-13 DIAGNOSIS — Z23 Encounter for immunization: Secondary | ICD-10-CM

## 2013-04-13 DIAGNOSIS — I10 Essential (primary) hypertension: Secondary | ICD-10-CM | POA: Diagnosis present

## 2013-04-13 DIAGNOSIS — Z87891 Personal history of nicotine dependence: Secondary | ICD-10-CM

## 2013-04-13 DIAGNOSIS — M7989 Other specified soft tissue disorders: Secondary | ICD-10-CM | POA: Diagnosis present

## 2013-04-13 DIAGNOSIS — Z9981 Dependence on supplemental oxygen: Secondary | ICD-10-CM

## 2013-04-13 DIAGNOSIS — K219 Gastro-esophageal reflux disease without esophagitis: Secondary | ICD-10-CM | POA: Diagnosis present

## 2013-04-13 DIAGNOSIS — Z8249 Family history of ischemic heart disease and other diseases of the circulatory system: Secondary | ICD-10-CM

## 2013-04-13 DIAGNOSIS — E785 Hyperlipidemia, unspecified: Secondary | ICD-10-CM | POA: Diagnosis present

## 2013-04-13 DIAGNOSIS — E119 Type 2 diabetes mellitus without complications: Secondary | ICD-10-CM | POA: Diagnosis present

## 2013-04-13 DIAGNOSIS — I4891 Unspecified atrial fibrillation: Secondary | ICD-10-CM | POA: Diagnosis present

## 2013-04-13 DIAGNOSIS — I482 Chronic atrial fibrillation, unspecified: Secondary | ICD-10-CM | POA: Diagnosis present

## 2013-04-13 DIAGNOSIS — A0472 Enterocolitis due to Clostridium difficile, not specified as recurrent: Principal | ICD-10-CM | POA: Diagnosis present

## 2013-04-13 DIAGNOSIS — J449 Chronic obstructive pulmonary disease, unspecified: Secondary | ICD-10-CM | POA: Diagnosis present

## 2013-04-13 DIAGNOSIS — J9620 Acute and chronic respiratory failure, unspecified whether with hypoxia or hypercapnia: Secondary | ICD-10-CM

## 2013-04-13 DIAGNOSIS — E669 Obesity, unspecified: Secondary | ICD-10-CM | POA: Diagnosis present

## 2013-04-13 DIAGNOSIS — Z8546 Personal history of malignant neoplasm of prostate: Secondary | ICD-10-CM

## 2013-04-13 DIAGNOSIS — Z954 Presence of other heart-valve replacement: Secondary | ICD-10-CM

## 2013-04-13 DIAGNOSIS — H9190 Unspecified hearing loss, unspecified ear: Secondary | ICD-10-CM | POA: Diagnosis present

## 2013-04-13 DIAGNOSIS — I5040 Unspecified combined systolic (congestive) and diastolic (congestive) heart failure: Secondary | ICD-10-CM

## 2013-04-13 DIAGNOSIS — M1990 Unspecified osteoarthritis, unspecified site: Secondary | ICD-10-CM | POA: Diagnosis present

## 2013-04-13 DIAGNOSIS — I509 Heart failure, unspecified: Secondary | ICD-10-CM | POA: Diagnosis present

## 2013-04-13 DIAGNOSIS — I2510 Atherosclerotic heart disease of native coronary artery without angina pectoris: Secondary | ICD-10-CM | POA: Diagnosis present

## 2013-04-13 DIAGNOSIS — Z8679 Personal history of other diseases of the circulatory system: Secondary | ICD-10-CM

## 2013-04-13 LAB — CBC WITH DIFFERENTIAL/PLATELET
Eosinophils Absolute: 0.1 10*3/uL (ref 0.0–0.7)
Eosinophils Relative: 1 % (ref 0–5)
Hemoglobin: 13.7 g/dL (ref 13.0–17.0)
Lymphocytes Relative: 5 % — ABNORMAL LOW (ref 12–46)
Lymphs Abs: 0.5 10*3/uL — ABNORMAL LOW (ref 0.7–4.0)
MCH: 31.1 pg (ref 26.0–34.0)
MCV: 98.4 fL (ref 78.0–100.0)
Monocytes Relative: 16 % — ABNORMAL HIGH (ref 3–12)
RBC: 4.4 MIL/uL (ref 4.22–5.81)
RDW: 15.4 % (ref 11.5–15.5)

## 2013-04-13 LAB — TROPONIN I: Troponin I: <0.3 ng/mL (ref <0.30)

## 2013-04-13 LAB — PRO B NATRIURETIC PEPTIDE: Pro B Natriuretic peptide (BNP): 6401 pg/mL — ABNORMAL HIGH (ref 0–450)

## 2013-04-13 LAB — COMPREHENSIVE METABOLIC PANEL
ALT: 12 U/L (ref 0–53)
BUN: 22 mg/dL (ref 6–23)
Calcium: 8.6 mg/dL (ref 8.4–10.5)
Chloride: 98 mEq/L (ref 96–112)
Creatinine, Ser: 1.12 mg/dL (ref 0.50–1.35)
GFR calc Af Amer: 65 mL/min — ABNORMAL LOW (ref >90)
GFR calc non Af Amer: 56 mL/min — ABNORMAL LOW (ref >90)
Glucose, Bld: 87 mg/dL (ref 70–99)
Total Bilirubin: 0.9 mg/dL (ref 0.3–1.2)
Total Protein: 6.6 g/dL (ref 6.0–8.3)

## 2013-04-13 MED ORDER — CYCLOBENZAPRINE HCL 10 MG PO TABS
5.0000 mg | ORAL_TABLET | Freq: Three times a day (TID) | ORAL | Status: DC
  Administered 2013-04-13 – 2013-04-14 (×2): 5 mg via ORAL
  Filled 2013-04-13 (×2): qty 1

## 2013-04-13 MED ORDER — SIMVASTATIN 20 MG PO TABS
20.0000 mg | ORAL_TABLET | ORAL | Status: DC
  Administered 2013-04-14: 20 mg via ORAL
  Filled 2013-04-13: qty 1

## 2013-04-13 MED ORDER — SODIUM CHLORIDE 0.9 % IV BOLUS (SEPSIS)
500.0000 mL | Freq: Once | INTRAVENOUS | Status: AC
  Administered 2013-04-13: 500 mL via INTRAVENOUS

## 2013-04-13 MED ORDER — INFLUENZA VAC SPLIT QUAD 0.5 ML IM SUSP
0.5000 mL | INTRAMUSCULAR | Status: AC
  Filled 2013-04-13: qty 0.5

## 2013-04-13 MED ORDER — ENOXAPARIN SODIUM 40 MG/0.4ML ~~LOC~~ SOLN
40.0000 mg | SUBCUTANEOUS | Status: DC
  Administered 2013-04-13 – 2013-04-15 (×2): 40 mg via SUBCUTANEOUS
  Filled 2013-04-13 (×2): qty 0.4

## 2013-04-13 MED ORDER — TAMSULOSIN HCL 0.4 MG PO CAPS
0.4000 mg | ORAL_CAPSULE | Freq: Every day | ORAL | Status: DC
  Administered 2013-04-13 – 2013-04-15 (×2): 0.4 mg via ORAL
  Filled 2013-04-13 (×2): qty 1

## 2013-04-13 MED ORDER — HYDROCODONE-ACETAMINOPHEN 5-325 MG PO TABS: 1.0000 | ORAL_TABLET | ORAL | Status: DC | PRN

## 2013-04-13 MED ORDER — DIGOXIN 125 MCG PO TABS
0.1250 mg | ORAL_TABLET | Freq: Every day | ORAL | Status: DC
  Administered 2013-04-14 – 2013-04-16 (×3): 0.125 mg via ORAL
  Filled 2013-04-13 (×3): qty 1

## 2013-04-13 MED ORDER — CYCLOSPORINE 0.05 % OP EMUL
1.0000 [drp] | Freq: Two times a day (BID) | OPHTHALMIC | Status: DC
  Administered 2013-04-14 – 2013-04-16 (×3): 1 [drp] via OPHTHALMIC
  Filled 2013-04-13 (×12): qty 1

## 2013-04-13 MED ORDER — IPRATROPIUM BROMIDE 0.02 % IN SOLN: 0.5000 mg | Freq: Four times a day (QID) | RESPIRATORY_TRACT | Status: DC

## 2013-04-13 MED ORDER — IPRATROPIUM BROMIDE 0.02 % IN SOLN
250.0000 ug | Freq: Four times a day (QID) | RESPIRATORY_TRACT | Status: DC
  Administered 2013-04-13: 500 ug via RESPIRATORY_TRACT
  Filled 2013-04-13: qty 2.5

## 2013-04-13 MED ORDER — ALBUTEROL SULFATE (5 MG/ML) 0.5% IN NEBU
2.5000 mg | INHALATION_SOLUTION | Freq: Four times a day (QID) | RESPIRATORY_TRACT | Status: DC | PRN
  Administered 2013-04-13: 2.5 mg via RESPIRATORY_TRACT
  Filled 2013-04-13: qty 0.5

## 2013-04-13 NOTE — ED Provider Notes (Signed)
CSN: [id number]     Arrival date & time 04/13/13  1651 History   First MD Initiated Contact with Patient 04/13/13 1658    Scribed for Geoffery Lyons, MD, the patient was seen in room APA08/APA08. This chart was scribed by Lewanda Rife, ED scribe. Patient's care was started at 5:06 PM  Chief Complaint  Patient presents with  . Weakness  . Dizziness  . Diarrhea   (Consider location/radiation/quality/duration/timing/severity/associated sxs/prior Treatment) The history is provided by the patient. No language interpreter was used.   HPI Comments: [name] is a 79 y.o. male who presents to the Emergency Department complaining of worsening, constant generalized weakness onset last several days. Reports associated diarrhea. Denies any aggravating or alleviating factors. Denies associated any pain fever, abdominal pain, melena, hematochezia, and emesis. Reports he was recently admitted for CHF. Reports he was discharged 9 days ago for recent admission for CHF.   Past Medical History  Diagnosis Date  . Mitral valve prolapse 2003    MVR in 2003  . Obesity   . Osteoarthritis   . Hypertension   . Hyperlipidemia   . COPD (chronic obstructive pulmonary disease)   . CHF (congestive heart failure) diastolic 10/2010    CXR in 12/2010: Prior CABG, cardiomegaly, vascular redistribution, bibasilar atelectasis, small effusions  . Pneumonia   . Impaired glucose tolerance   . Atrial fibrillation, chronic   . Prostate cancer     elevated PSA  . GERD (gastroesophageal reflux disease)     + hiatal hernia  . Arteriosclerotic cardiovascular disease (ASCVD) 1994    stent to RCA; CABG-2003  . Hard of hearing   . Anemia, iron deficiency   . AAA (abdominal aortic aneurysm)     Fusiform; infrarenal; 4-4.1cm on CT in 07/2008 and 12/2010 by MRI  . Gastric ulcer 2004    2004; upper GI bleed  . Renal cysts, acquired, bilateral     Complex by MRI in 12/2010  . Adenomatous polyps 04/07/2012  .  HYPERTENSION 12/17/2007    Subsequently hypotensive and all antihypertensive medication discontinued Lab  03/2012: Mild anemia with H&H-11.8/37.5, MCV-97, ferritin-224, normal CMet ex G-124, alb-3.4   . NSTEMI (non-ST elevated myocardial infarction) 01/18/2011  . Diabetes mellitus, type II 07/13/12    family denies patient is diabetic  . Macrocytosis without anemia 08/13/2012  . Acute systolic congestive heart failure 08/15/2012    EF 35-40%, per Echo  . Aortic stenosis, moderate 08/15/2012  . Pulmonary hypertension due to COPD 08/15/2012    53 mm Hg  . Tricuspid valve regurgitation 08/15/2012  . Retroperitoneal bleed 06/2012   Past Surgical History  Procedure Laterality Date  . Knee surgery      Left  . Bladder surgery    . Femoral artery stent  12-06-10    Left SFA  . Appendectomy    . Mitral valve replacement (mvr)/coronary artery bypass grafting (cabg)  2003    stent to RCA in 1994  . Eye surgery  2007    bilateral cataracts  . Tonsillectomy      thinks they were removed while in [employer]  . Mass excision  06/07/2011    Procedure: EXCISION MASS;  Surgeon: Fabio Bering;  Location: [facility];  Service: General;  Laterality: N/A;  excision of 2 masses back and buttocks  . Back surgery    . Esophagogastroduodenoscopy  03/03/2003    Large, deep prepyloric ulcer, as described above without bleeding/ Normal esophagus  . Esophagogastroduodenoscopy  09/03/2004  normal throughout  . Colonoscopy  09/03/2004    small ulcer, without stigmata of bleeding  . Esophagogastroduodenoscopy  11/14/2004    Normal esophagus and small hiatal hernia, otherwise normal stomach   . Colonoscopy  11/14/2004    Internal hemorrhoids, otherwise normal rectum/ left-sided diverticula , diffusely oozing right colon mucosa without a discrete lesion amenable to endoscopic therapy. 2 diminutive polyps. FELT TO HAVE AVMs/telangiectasias  . Colonoscopy  01/29/2005    Normal rectum  . Colonoscopy  03/11/2012     Colonic diverticulosis. Colonic polyps-removed as described above. Vascular anomalies in the cecum likely representing hemangiomas. Status post hemostasis clipping of  2 of the 3. ADENOMATOUS POLYPS. Repeat 2016  . Esophagogastroduodenoscopy  03/11/2012    Deformity of the antrum;  small polyp in antrum-not manipulated. Otherwise normal exam   Family History  Problem Relation Age of Onset  . Stroke Mother   . Stroke Brother   . Heart attack Brother   . Cancer Brother   . Cancer Brother   . Anesthesia problems Neg Hx   . Hypotension Neg Hx   . Malignant hyperthermia Neg Hx   . Pseudochol deficiency Neg Hx    History  Substance Use Topics  . Smoking status: Former Smoker -- 1.00 packs/day for 70 years    Types: Cigarettes    Quit date: 06/25/2007  . Smokeless tobacco: Former Neurosurgeon  . Alcohol Use: No    Review of Systems  Constitutional: Negative for fever.  Gastrointestinal: Positive for diarrhea.  Neurological: Positive for weakness.  All other systems reviewed and are negative.  A complete 10 system review of systems was obtained and all systems are negative except as noted in the HPI and PMHx.     Allergies  Aspirin; Gabapentin; and Naproxen  Home Medications   Current Outpatient Rx  Name  Route  Sig  Dispense  Refill  . albuterol (PROVENTIL) (2.5 MG/3ML) 0.083% nebulizer solution   Nebulization   Take 2.5 mg by nebulization every 6 (six) hours as needed for wheezing or shortness of breath.         . cycloSPORINE (RESTASIS) 0.05 % ophthalmic emulsion   Both Eyes   Place 1 drop into both eyes 2 (two) times daily.         . digoxin (LANOXIN) 0.125 MG tablet   Oral   Take 0.125 mg by mouth daily.         . Fluticasone-Salmeterol (ADVAIR) 100-50 MCG/DOSE AEPB   Inhalation   Inhale 1 puff into the lungs every 12 (twelve) hours.         . furosemide (LASIX) 20 MG tablet   Oral   Take 60 mg by mouth daily.         Marland Kitchen guaifenesin (HUMIBID E) 400 MG TABS  tablet   Oral   Take 400 mg by mouth every 4 (four) hours as needed. Cough/congestion         . ipratropium (ATROVENT) 0.02 % nebulizer solution   Nebulization   Take 250 mcg by nebulization every 6 (six) hours.          Marland Kitchen levofloxacin (LEVAQUIN) 750 MG tablet   Oral   Take 1 tablet (750 mg total) by mouth at bedtime.   2 tablet   0   . lovastatin (MEVACOR) 40 MG tablet   Oral   Take 40 mg by mouth 4 (four) times a week. Patient takes on Sunday,Monday,Wednesday,Friday         .  Multiple Vitamins-Minerals (CENTRUM SILVER) tablet   Oral   Take 1 tablet by mouth daily.         Marland Kitchen omeprazole (PRILOSEC) 40 MG capsule   Oral   Take 40 mg by mouth daily.         . potassium chloride SA (K-DUR,KLOR-CON) 20 MEQ tablet   Oral   Take 20 mEq by mouth 2 (two) times daily.         . tamsulosin (FLOMAX) 0.4 MG CAPS capsule   Oral   Take 0.4 mg by mouth daily.          BP 117/77  Pulse 81  Temp(Src) 97.9 F (36.6 C) (Oral)  Resp 24  Ht 6\' 3"  (1.905 m)  Wt 236 lb (107.049 kg)  BMI 29.5 kg/m2  SpO2 2% Physical Exam  Nursing note and vitals reviewed. Constitutional: He is oriented to person, place, and time. He appears well-developed and well-nourished. No distress.  HENT:  Head: Normocephalic and atraumatic.  Mouth/Throat: Mucous membranes are dry. No oropharyngeal exudate.  Eyes: Conjunctivae and EOM are normal.  Neck: Neck supple. No tracheal deviation present.  Cardiovascular: Normal rate.  An irregular rhythm present.  Murmur heard.  Systolic murmur is present with a grade of 3/6  Murmur heard best at right upper sternal border   Pulmonary/Chest: Effort normal. No respiratory distress. He has rales (in the bases bilaterally ).  Abdominal: Soft. He exhibits no distension. There is no tenderness. There is no rebound and no guarding.  Musculoskeletal: Normal range of motion. He exhibits no edema.  Neurological: He is alert and oriented to person, place, and  time.  Skin: Skin is warm and dry.  Psychiatric: He has a normal mood and affect. His behavior is normal.    ED Course  Procedures (including critical care time) COORDINATION OF CARE:  Nursing notes reviewed. Vital signs reviewed. Initial pt interview and examination performed.   5:18 PM-Discussed work up plan with pt at bedside, which includes CXR, BNP, CBC with diff panel, CMP, troponin, and C. Diff by PCR. Pt agrees with plan.   Treatment plan initiated: Medications  sodium chloride 0.9 % bolus 500 mL (not administered)     Initial diagnostic testing ordered.    Labs Review Labs Reviewed  CLOSTRIDIUM DIFFICILE BY PCR  CBC WITH DIFFERENTIAL  COMPREHENSIVE METABOLIC PANEL  TROPONIN I  PRO B NATRIURETIC PEPTIDE   Imaging Review Dg Chest Port 1 View  04/13/2013   CLINICAL DATA:  Orthostatic hypotension with diarrhea and weakness.  EXAM: PORTABLE CHEST - 1 VIEW  COMPARISON:  04/02/2013 and CT 04/01/2013  FINDINGS: Again noted is enlargement of the cardiac silhouette. Again noted is volume loss in right hemithorax with persistent right basilar densities. Again noted are prominent lung markings bilaterally. No evidence for a pneumothorax.  IMPRESSION: Minimal change from the previous examination. Persistent volume loss in the right hemithorax with right basilar densities.  Cardiomegaly.   Electronically Signed   By: Richarda Overlie M.D.   On: 04/13/2013 17:48     Date: 04/13/2013  Rate: 81  Rhythm: normal sinus rhythm with pvcs  QRS Axis: normal  Intervals: normal  ST/T Wave abnormalities: nonspecific T wave changes  Conduction Disutrbances:nonspecific intraventricular conduction delay  Narrative Interpretation:   Old EKG Reviewed: unchanged    MDM  No diagnosis found. Patient is an 79 year old male with past medical history significant for congestive heart failure. He was recently hospitalized for an exacerbation of this. Since he was  discharged several days ago he has been  having persistent diarrhea and feels weak and dizzy. He was given cautious IV fluids in the ER and workup was initiated. His laboratory studies are centrally unremarkable. His CBC and comp are reassuring and troponin is negative. Does have an elevated BNP which is slightly higher than at discharge. Due to his age and comorbidities I feel as though admission for observation is warranted. He was discharged from his prior hospitalization on Levaquin and I am concerned that this could potentially be C. difficile. He will be admitted to the hospital for observation and further workup.   I personally performed the services described in this documentation, which was scribed in my presence. The recorded information has been reviewed and is accurate.       Geoffery Lyons, MD 04/13/13 [id number]

## 2013-04-13 NOTE — H&P (Signed)
PCP:   [name], MD   Chief Complaint:  Diarrhea  HPI: 79 year old male with significant medical problems including COPD, CHF, CAD, chronic respiratory failure on home oxygen, history of retroperitoneal hematoma, abdominal aortic aneurysm who was recently discharged from the hospital after being treated for pneumonia. Patient was sent home on Levaquin for 2 more days. As per daughter who is accompanying the patient patient was doing fine for initial few days till he started getting physical therapy. As per patient's daughter the physical therapist worked on patient's left leg, and it caused swelling of the left leg. Since that time patient has not been able to walk and has been avoiding any movement due to the left thigh pain. Patient also developed diarrhea 3 days ago, though there was no blood in it and it was watery. Patient was having 7-8" meds everyday. He denies any abdominal pain no nausea vomiting. She denies chest pain or shortness of breath he does have congestive heart failure and has been taking his medications as prescribed. He denies fever no dizziness no fall.  Allergies:   Allergies  Allergen Reactions  . Aspirin   . Gabapentin     Patient cannot take over 200 mg dose at a time as it causes jerking or spasms  . Naproxen       Past Medical History  Diagnosis Date  . Mitral valve prolapse 2003    MVR in 2003  . Obesity   . Osteoarthritis   . Hypertension   . Hyperlipidemia   . COPD (chronic obstructive pulmonary disease)   . CHF (congestive heart failure) diastolic 10/2010    CXR in 12/2010: Prior CABG, cardiomegaly, vascular redistribution, bibasilar atelectasis, small effusions  . Pneumonia   . Impaired glucose tolerance   . Atrial fibrillation, chronic   . Prostate cancer     elevated PSA  . GERD (gastroesophageal reflux disease)     + hiatal hernia  . Arteriosclerotic cardiovascular disease (ASCVD) 1994    stent to RCA; CABG-2003  . Hard of hearing    . Anemia, iron deficiency   . AAA (abdominal aortic aneurysm)     Fusiform; infrarenal; 4-4.1cm on CT in 07/2008 and 12/2010 by MRI  . Gastric ulcer 2004    2004; upper GI bleed  . Renal cysts, acquired, bilateral     Complex by MRI in 12/2010  . Adenomatous polyps 04/07/2012  . HYPERTENSION 12/17/2007    Subsequently hypotensive and all antihypertensive medication discontinued Lab  03/2012: Mild anemia with H&H-11.8/37.5, MCV-97, ferritin-224, normal CMet ex G-124, alb-3.4   . NSTEMI (non-ST elevated myocardial infarction) 01/18/2011  . Diabetes mellitus, type II 07/13/12    family denies patient is diabetic  . Macrocytosis without anemia 08/13/2012  . Acute systolic congestive heart failure 08/15/2012    EF 35-40%, per Echo  . Aortic stenosis, moderate 08/15/2012  . Pulmonary hypertension due to COPD 08/15/2012    53 mm Hg  . Tricuspid valve regurgitation 08/15/2012  . Retroperitoneal bleed 06/2012    Past Surgical History  Procedure Laterality Date  . Knee surgery      Left  . Bladder surgery    . Femoral artery stent  12-06-10    Left SFA  . Appendectomy    . Mitral valve replacement (mvr)/coronary artery bypass grafting (cabg)  2003    stent to RCA in 1994  . Eye surgery  2007    bilateral cataracts  . Tonsillectomy      thinks they  were removed while in the [employer]  . Mass excision  06/07/2011    Procedure: EXCISION MASS;  Surgeon: Fabio Bering;  Location: [facility];  Service: General;  Laterality: N/A;  excision of 2 masses back and buttocks  . Back surgery    . Esophagogastroduodenoscopy  03/03/2003    Large, deep prepyloric ulcer, as described above without bleeding/ Normal esophagus  . Esophagogastroduodenoscopy  09/03/2004    normal throughout  . Colonoscopy  09/03/2004    small ulcer, without stigmata of bleeding  . Esophagogastroduodenoscopy  11/14/2004    Normal esophagus and small hiatal hernia, otherwise normal stomach   . Colonoscopy  11/14/2004    Internal  hemorrhoids, otherwise normal rectum/ left-sided diverticula , diffusely oozing right colon mucosa without a discrete lesion amenable to endoscopic therapy. 2 diminutive polyps. FELT TO HAVE AVMs/telangiectasias  . Colonoscopy  01/29/2005    Normal rectum  . Colonoscopy  03/11/2012    Colonic diverticulosis. Colonic polyps-removed as described above. Vascular anomalies in the cecum likely representing hemangiomas. Status post hemostasis clipping of  2 of the 3. ADENOMATOUS POLYPS. Repeat 2016  . Esophagogastroduodenoscopy  03/11/2012    Deformity of the antrum;  small polyp in antrum-not manipulated. Otherwise normal exam    Prior to Admission medications   Medication Sig Start Date End Date Taking? Authorizing Provider  albuterol (PROVENTIL) (2.5 MG/3ML) 0.083% nebulizer solution Take 2.5 mg by nebulization every 6 (six) hours as needed for wheezing or shortness of breath.   Yes Historical Provider, MD  cycloSPORINE (RESTASIS) 0.05 % ophthalmic emulsion Place 1 drop into both eyes 2 (two) times daily.   Yes Historical Provider, MD  digoxin (LANOXIN) 0.125 MG tablet Take 0.125 mg by mouth daily.   Yes Historical Provider, MD  Fluticasone-Salmeterol (ADVAIR) 100-50 MCG/DOSE AEPB Inhale 1 puff into the lungs every 12 (twelve) hours.   Yes Historical Provider, MD  furosemide (LASIX) 20 MG tablet Take 60 mg by mouth daily.   Yes Historical Provider, MD  guaifenesin (HUMIBID E) 400 MG TABS tablet Take 400 mg by mouth 2 (two) times daily. Cough/congestion   Yes Historical Provider, MD  ipratropium (ATROVENT) 0.02 % nebulizer solution Take 250 mcg by nebulization every 6 (six) hours.  02/23/13  Yes Historical Provider, MD  levofloxacin (LEVAQUIN) 750 MG tablet Take 1 tablet (750 mg total) by mouth at bedtime. 04/04/13  Yes Erick Blinks, MD  lovastatin (MEVACOR) 40 MG tablet Take 40 mg by mouth 4 (four) times a week. Patient takes on Sunday,Monday,Wednesday,Friday   Yes Historical Provider, MD  Multiple  Vitamins-Minerals (CENTRUM SILVER) tablet Take 1 tablet by mouth daily.   Yes Historical Provider, MD  omeprazole (PRILOSEC) 40 MG capsule Take 40 mg by mouth daily.   Yes Historical Provider, MD  potassium chloride SA (K-DUR,KLOR-CON) 20 MEQ tablet Take 20 mEq by mouth 2 (two) times daily.   Yes Historical Provider, MD  tamsulosin (FLOMAX) 0.4 MG CAPS capsule Take 0.4 mg by mouth at bedtime.    Yes Historical Provider, MD    Social History:  reports that he quit smoking about 5 years ago. His smoking use included Cigarettes. He has a 70 pack-year smoking history. He has quit using smokeless tobacco. He reports that he does not drink alcohol or use illicit drugs.  Family History  Problem Relation Age of Onset  . Stroke Mother   . Stroke Brother   . Heart attack Brother   . Cancer Brother   . Cancer Brother   .  Anesthesia problems Neg Hx   . Hypotension Neg Hx   . Malignant hyperthermia Neg Hx   . Pseudochol deficiency Neg Hx      All the positives are listed in BOLD  Review of Systems:  HEENT: Headache, blurred vision, runny nose, sore throat Neck: Hypothyroidism, hyperthyroidism,,lymphadenopathy Chest : Shortness of breath, history of COPD, Asthma Heart : Chest pain, history of coronary arterey disease GI:  Nausea, vomiting, diarrhea, constipation, GERD GU: Dysuria, urgency, frequency of urination, hematuria Neuro: Stroke, seizures, syncope Psych: Depression, anxiety, hallucinations   Physical Exam: Blood pressure 107/57, pulse 68, temperature 97.9 F (36.6 C), temperature source Oral, resp. rate 25, height 6\' 3"  (1.905 m), weight 107.049 kg (236 lb), SpO2 94.00%. Constitutional:   Patient is a well-developed and well-nourished *male in no acute distress and cooperative with exam. Head: Normocephalic and atraumatic Mouth: Mucus membranes moist Eyes: PERRL, EOMI, conjunctivae normal Neck: Supple, No Thyromegaly Cardiovascular: RRR, S1 normal, S2 normal Pulmonary/Chest:  CTAB, no wheezes, rales, or rhonchi Abdominal: Soft. Non-tender, non-distended, bowel sounds are normal, no masses, organomegaly, or guarding present.  Neurological: A&O x3, Strenght is normal and symmetric bilaterally, cranial nerve II-XII are grossly intact, no focal motor deficit, sensory intact to light touch bilaterally.  Extremities : Swelling noted in the left thigh, tender to palpation.   Labs on Admission:  Results for orders placed during the hospital encounter of 04/13/13 (from the past 48 hour(s))  CBC WITH DIFFERENTIAL     Status: Abnormal   Collection Time    04/13/13  5:28 PM      Result Value Range   WBC 9.5  4.0 - 10.5 K/uL   RBC 4.40  4.22 - 5.81 MIL/uL   Hemoglobin 13.7  13.0 - 17.0 g/dL   HCT 16.1  09.6 - 04.5 %   MCV 98.4  78.0 - 100.0 fL   MCH 31.1  26.0 - 34.0 pg   MCHC 31.6  30.0 - 36.0 g/dL   RDW 40.9  81.1 - 91.4 %   Platelets 126 (*) 150 - 400 K/uL   Neutrophils Relative % 78 (*) 43 - 77 %   Neutro Abs 7.4  1.7 - 7.7 K/uL   Lymphocytes Relative 5 (*) 12 - 46 %   Lymphs Abs 0.5 (*) 0.7 - 4.0 K/uL   Monocytes Relative 16 (*) 3 - 12 %   Monocytes Absolute 1.5 (*) 0.1 - 1.0 K/uL   Eosinophils Relative 1  0 - 5 %   Eosinophils Absolute 0.1  0.0 - 0.7 K/uL   Basophils Relative 0  0 - 1 %   Basophils Absolute 0.0  0.0 - 0.1 K/uL  COMPREHENSIVE METABOLIC PANEL     Status: Abnormal   Collection Time    04/13/13  5:28 PM      Result Value Range   Sodium 138  135 - 145 mEq/L   Potassium 3.6  3.5 - 5.1 mEq/L   Chloride 98  96 - 112 mEq/L   CO2 30  19 - 32 mEq/L   Glucose, Bld 87  70 - 99 mg/dL   BUN 22  6 - 23 mg/dL   Creatinine, Ser 7.82  0.50 - 1.35 mg/dL   Calcium 8.6  8.4 - 95.6 mg/dL   Total Protein 6.6  6.0 - 8.3 g/dL   Albumin 3.5  3.5 - 5.2 g/dL   AST 23  0 - 37 U/L   ALT 12  0 - 53 U/L   Alkaline  Phosphatase 55  39 - 117 U/L   Total Bilirubin 0.9  0.3 - 1.2 mg/dL   GFR calc non Af Amer 56 (*) >90 mL/min   GFR calc Af Amer 65 (*) >90 mL/min    Comment: (NOTE)     The eGFR has been calculated using the CKD EPI equation.     This calculation has not been validated in all clinical situations.     eGFR's persistently <90 mL/min signify possible Chronic Kidney     Disease.  TROPONIN I     Status: None   Collection Time    04/13/13  5:28 PM      Result Value Range   Troponin I <0.30  <0.30 ng/mL   Comment:            Due to the release kinetics of cTnI,     a negative result within the first hours     of the onset of symptoms does not rule out     myocardial infarction with certainty.     If myocardial infarction is still suspected,     repeat the test at appropriate intervals.  PRO B NATRIURETIC PEPTIDE     Status: Abnormal   Collection Time    04/13/13  5:28 PM      Result Value Range   Pro B Natriuretic peptide (BNP) 6401.0 (*) 0 - 450 pg/mL    Radiological Exams on Admission: Dg Chest Port 1 View  04/13/2013   CLINICAL DATA:  Orthostatic hypotension with diarrhea and weakness.  EXAM: PORTABLE CHEST - 1 VIEW  COMPARISON:  04/02/2013 and CT 04/01/2013  FINDINGS: Again noted is enlargement of the cardiac silhouette. Again noted is volume loss in right hemithorax with persistent right basilar densities. Again noted are prominent lung markings bilaterally. No evidence for a pneumothorax.  IMPRESSION: Minimal change from the previous examination. Persistent volume loss in the right hemithorax with right basilar densities.  Cardiomegaly.   Electronically Signed   By: Richarda Overlie M.D.   On: 04/13/2013 17:48    Assessment/Plan Principal Problem:   Diarrhea Active Problems:   Chronic respiratory failure   Atrial fibrillation, chronic   Arteriosclerotic cardiovascular disease (ASCVD)   CHF (congestive heart failure)   Left leg swelling  79 year old male who was recently discharged after being treated for pneumonia. Now presenting with left thigh swelling as well as diarrhea, I'm concerned with possibility of DVT. Unfortunately  ultrasound is not available at night, we'll have to order ultrasound of the left leg for tomorrow morning. Wanted to give 1 dose of Lovenox, but daughter does not want him to get blood thinners unless absolutely needed, as patient has history of bleeding and also retroperitoneal hematoma earlier this year. Will order ultrasound of of the left leg in a.m. As patient was discharged on Levaquin, there's a possibility of C. difficile colitis. Will order stool culture as well as stool for C. difficile PCR. Unable to hold the Lasix at this time, we'll start the patient on full liquid diet. We'll continue the home medications including albuterol and ipratropium every 6 hours for COPD along with digoxin for A. Fib.  Code status: Full code  Family discussion: Discussed with patient's daughter at bedside  Time Spent on Admission: 75 min  [facility] Pager: [phone] 04/13/2013, 8:10 PM  If 7PM-7AM, please contact night-coverage  [email or web address]  Password [email or web address]

## 2013-04-13 NOTE — ED Notes (Signed)
D/C'ed 9 days ago from [facility] for tx of CHF.  Home Health nurse saw patient today, stating he had orthostatic hypotension, had been having diarrhea and was weak and dizzy.  CBG 95.  Rhythm 1st degree w/PVCs and BBB.  Hx a-fib.  A/Ox 4.

## 2013-04-13 NOTE — Progress Notes (Signed)
Received report from [employer]. Patient is going to go to radiology before being brought to unit 300.

## 2013-04-14 ENCOUNTER — Inpatient Hospital Stay (HOSPITAL_COMMUNITY): Payer: Medicare Other

## 2013-04-14 DIAGNOSIS — G9340 Encephalopathy, unspecified: Secondary | ICD-10-CM

## 2013-04-14 DIAGNOSIS — J9610 Chronic respiratory failure, unspecified whether with hypoxia or hypercapnia: Secondary | ICD-10-CM

## 2013-04-14 DIAGNOSIS — A0472 Enterocolitis due to Clostridium difficile, not specified as recurrent: Principal | ICD-10-CM

## 2013-04-14 LAB — CBC
HCT: 43.2 % (ref 39.0–52.0)
Hemoglobin: 13.7 g/dL (ref 13.0–17.0)
MCV: 98.2 fL (ref 78.0–100.0)
Platelets: 124 10*3/uL — ABNORMAL LOW (ref 150–400)
RBC: 4.4 MIL/uL (ref 4.22–5.81)
WBC: 8.5 10*3/uL (ref 4.0–10.5)

## 2013-04-14 LAB — BLOOD GAS, ARTERIAL
Acid-Base Excess: 1 mmol/L (ref 0.0–2.0)
Bicarbonate: 27.5 mEq/L — ABNORMAL HIGH (ref 20.0–24.0)
Delivery systems: POSITIVE
Drawn by: 27407
Inspiratory PAP: 14
O2 Saturation: 86.1 %
O2 Saturation: 94.2 %
Patient temperature: 37
pCO2 arterial: 51.3 mmHg — ABNORMAL HIGH (ref 35.0–45.0)
pH, Arterial: 7.283 — ABNORMAL LOW (ref 7.350–7.450)
pO2, Arterial: 55.5 mmHg — ABNORMAL LOW (ref 80.0–100.0)

## 2013-04-14 LAB — COMPREHENSIVE METABOLIC PANEL
ALT: 10 U/L (ref 0–53)
AST: 21 U/L (ref 0–37)
BUN: 23 mg/dL (ref 6–23)
CO2: 28 mEq/L (ref 19–32)
Chloride: 102 mEq/L (ref 96–112)
Creatinine, Ser: 1.08 mg/dL (ref 0.50–1.35)
GFR calc Af Amer: 68 mL/min — ABNORMAL LOW (ref >90)
GFR calc non Af Amer: 59 mL/min — ABNORMAL LOW (ref >90)
Glucose, Bld: 73 mg/dL (ref 70–99)
Potassium: 3.7 mEq/L (ref 3.5–5.1)
Total Bilirubin: 0.9 mg/dL (ref 0.3–1.2)

## 2013-04-14 MED ORDER — METRONIDAZOLE 500 MG PO TABS
500.0000 mg | ORAL_TABLET | Freq: Three times a day (TID) | ORAL | Status: DC
  Administered 2013-04-14 – 2013-04-16 (×5): 500 mg via ORAL
  Filled 2013-04-14 (×5): qty 1

## 2013-04-14 MED ORDER — FUROSEMIDE 40 MG PO TABS
60.0000 mg | ORAL_TABLET | Freq: Every day | ORAL | Status: DC
  Administered 2013-04-14 – 2013-04-16 (×3): 60 mg via ORAL
  Filled 2013-04-14 (×6): qty 1

## 2013-04-14 MED ORDER — IPRATROPIUM BROMIDE 0.02 % IN SOLN
0.5000 mg | Freq: Four times a day (QID) | RESPIRATORY_TRACT | Status: DC
  Administered 2013-04-14 – 2013-04-16 (×7): 0.5 mg via RESPIRATORY_TRACT
  Filled 2013-04-14 (×7): qty 2.5

## 2013-04-14 MED ORDER — ALBUTEROL SULFATE (5 MG/ML) 0.5% IN NEBU
2.5000 mg | INHALATION_SOLUTION | Freq: Four times a day (QID) | RESPIRATORY_TRACT | Status: DC
  Administered 2013-04-14 – 2013-04-16 (×7): 2.5 mg via RESPIRATORY_TRACT
  Filled 2013-04-14 (×7): qty 0.5

## 2013-04-14 NOTE — Progress Notes (Signed)
[facility] PROGRESS NOTE  [name] UJW:[id number] DOB: 1921/11/20 DOA: 04/13/2013 PCP: Leonard Overman, MD  Assessment/Plan: 1. Clostridium difficile. Patient was recently in the hospital, which would put him at risk for infection. He was started on Flagyl for C. difficile. Continue to monitor for clinical response. 2. Encephalopathy. Patient appears to be more confused her baseline today. Checking UA, ABG. Also recently started on Flexeril which will be discontinued. 3. Chronic respiratory failure. Appears to be stable. 4. Left lower extremity edema. X-ray of ankle is unremarkable, venous Dopplers are negative for DVT. Will restart the patient on Lasix. 5. Atrial fibrillation. Rate controlled. Not a candidate for any coagulation. 6. Chronic combined congestive heart failure. Resume home dose of Lasix.  Code Status: full code Family Communication: discussed with daughter Disposition Plan: pending hospital course   Consultants:  none  Procedures:  none  Antibiotics:  Flagyl 10/22  HPI/Subjective: Knows he is in the hospital, unclear as to why he is in the hospital  Objective: Filed Vitals:   04/14/13 0357  BP: 103/69  Pulse: 72  Temp: 98.4 F (36.9 C)  Resp: 18   No intake or output data in the 24 hours ending 04/14/13 1426 Filed Weights   04/13/13 1653 04/13/13 2150  Weight: 107.049 kg (236 lb) 103.7 kg (228 lb 9.9 oz)    Exam:   General:  NAD, speech is mildly slurred, appears drowsy and lethargic, answering questions  Cardiovascular: s1, s2, irregular  Respiratory: CTA B  Abdomen: soft, nt, nd, bs+  Musculoskeletal: left leg is more edematous than right.   Data Reviewed: Basic Metabolic Panel:  Recent Labs Lab 04/13/13 1728 04/14/13 0619  NA 138 142  K 3.6 3.7  CL 98 102  CO2 30 28  GLUCOSE 87 73  BUN 22 23  CREATININE 1.12 1.08  CALCIUM 8.6 8.5   Liver Function Tests:  Recent Labs Lab 04/13/13 1728 04/14/13 0619   AST 23 21  ALT 12 10  ALKPHOS 55 49  BILITOT 0.9 0.9  PROT 6.6 6.3  ALBUMIN 3.5 3.3*   No results found for this basename: LIPASE, AMYLASE,  in the last 168 hours No results found for this basename: AMMONIA,  in the last 168 hours CBC:  Recent Labs Lab 04/13/13 1728 04/14/13 0619  WBC 9.5 8.5  NEUTROABS 7.4  --   HGB 13.7 13.7  HCT 43.3 43.2  MCV 98.4 98.2  PLT 126* 124*   Cardiac Enzymes:  Recent Labs Lab 04/13/13 1728  TROPONINI <0.30   BNP (last 3 results)  Recent Labs  08/27/12 1348 04/02/13 1123 04/13/13 1728  PROBNP 8686.0* 4668.0* 6401.0*   CBG: No results found for this basename: GLUCAP,  in the last 168 hours  Recent Results (from the past 240 hour(s))  CLOSTRIDIUM DIFFICILE BY PCR     Status: Abnormal   Collection Time    04/13/13  8:10 PM      Result Value Range Status   C difficile by pcr POSITIVE (*) NEGATIVE Final   Comment: RESULT CALLED TO, READ BACK BY AND VERIFIED WITH:     HEARN J AT 2110 ON [id number] BY FORSYTH K     Studies: Dg Ankle Complete Right  04/13/2013   CLINICAL DATA:  Right ankle pain.  EXAM: RIGHT ANKLE - COMPLETE 3+ VIEW  COMPARISON:  None.  FINDINGS: There is no evidence of fracture, dislocation, or joint effusion. There is no evidence of arthropathy or other focal bone abnormality. Soft tissues  are unremarkable.  IMPRESSION: Normal right ankle.   Electronically Signed   By: Roque Lias M.D.   On: 04/13/2013 21:03   US Venous Img Lower Unilateral Left  04/14/2013   CLINICAL DATA:  Edema. History of prostate carcinoma.  EXAM: LEFT LOWER EXTREMITY VENOUS DOPPLER ULTRASOUND  TECHNIQUE: Gray-scale sonography with compression, as well as color and duplex ultrasound, were performed to evaluate the deep venous system from the level of the common femoral vein through the popliteal and proximal calf veins.  COMPARISON:  None  FINDINGS: Normal compressibility of the common femoral, superficial femoral, and popliteal veins, as well as  the proximal calf veins. No filling defects to suggest DVT on grayscale or color Doppler imaging. Doppler waveforms show normal direction of venous flow, normal respiratory phasicity and response to augmentation.  IMPRESSION: No evidence of  lower extremity deep vein thrombosis.   Electronically Signed   By: Oley Balm M.D.   On: 04/14/2013 13:36   Dg Chest Port 1 View  04/13/2013   CLINICAL DATA:  Orthostatic hypotension with diarrhea and weakness.  EXAM: PORTABLE CHEST - 1 VIEW  COMPARISON:  04/02/2013 and CT 04/01/2013  FINDINGS: Again noted is enlargement of the cardiac silhouette. Again noted is volume loss in right hemithorax with persistent right basilar densities. Again noted are prominent lung markings bilaterally. No evidence for a pneumothorax.  IMPRESSION: Minimal change from the previous examination. Persistent volume loss in the right hemithorax with right basilar densities.  Cardiomegaly.   Electronically Signed   By: Richarda Overlie M.D.   On: 04/13/2013 17:48    Scheduled Meds: . albuterol  2.5 mg Nebulization Q6H  . cyclobenzaprine  5 mg Oral Q8H  . cycloSPORINE  1 drop Both Eyes BID  . digoxin  0.125 mg Oral Daily  . enoxaparin (LOVENOX) injection  40 mg Subcutaneous Q24H  . influenza vac split quadrivalent PF  0.5 mL Intramuscular Tomorrow-1000  . ipratropium  0.5 mg Nebulization Q6H  . metroNIDAZOLE  500 mg Oral Q8H  . simvastatin  20 mg Oral Custom  . tamsulosin  0.4 mg Oral QHS   Continuous Infusions:   Principal Problem:   Diarrhea Active Problems:   Chronic respiratory failure   Atrial fibrillation, chronic   Arteriosclerotic cardiovascular disease (ASCVD)   CHF (congestive heart failure)   Left leg swelling    Time spent:    [name]  [facility] Pager [phone]. If 7PM-7AM, please contact night-coverage at [email or web address], password [facility] 04/14/2013, 2:26 PM  LOS: 1 day

## 2013-04-14 NOTE — Progress Notes (Signed)
Pt placed on BIPAP 14/6 with 5L bleed in per MD due to ABG results.  Pt tolerating well at this time.  RT will continue to monitor.

## 2013-04-14 NOTE — Progress Notes (Signed)
UR Chart Review Completed  

## 2013-04-14 NOTE — Progress Notes (Signed)
CRITICAL VALUE ALERT  Critical value received:  ABG results   pH 7.283,pCO2 60.0,pO2 55.5,[place] 27.5  Date of notification:  04/14/2013  Time of notification:  1455  Critical value read back:yes  Nurse who received alert:  Billie Lade LPN  MD notified (1st page):  Dr Kerry Hough  Time of first page:  1455  MD notified (2nd page):  Time of second page:  Responding MD: Dr Kerry Hough  Time MD responded:

## 2013-04-14 NOTE — Care Management Note (Signed)
    Page 1 of 2   04/16/2013     2:25:49 PM   CARE MANAGEMENT NOTE 04/16/2013  Patient:  SOHAIB, VEREEN   Account Number:  [id number]  Date Initiated:  04/14/2013  Documentation initiated by:  Rosemary Holms  Subjective/Objective Assessment:   CM spoke with pt but found him hard to understand. From previous CM notes, pt lives at home with his daughter. Has [facility] services and no further DME needs. Will follow and advise [facility] of admitted status.     Action/Plan:   Anticipated DC Date:  04/16/2013   Anticipated DC Plan:  HOME W HOME HEALTH SERVICES      DC Planning Services  CM consult      Choice offered to / List presented to:     DME arranged  HOSPITAL BED      DME agency  [facility]     [facility] arranged  HH-1 RN      [facility] agency  [facility]   Status of service:  Completed, signed off Medicare Important Message given?   (If response is "NO", the following Medicare IM given date fields will be blank) Date Medicare IM given:   Date Additional Medicare IM given:    Discharge Disposition:  HOME W HOME HEALTH SERVICES  Per UR Regulation:    If discussed at Long Length of Stay Meetings, dates discussed:    Comments:  04/16/13 Rosemary Holms RN BSN CM [name], pt's daughter requested [facility] RN but not PT. Hospital Bed order from [facility]. RN will resume [facility]  04/14/13 Rosemary Holms RN BSN CM

## 2013-04-15 DIAGNOSIS — I4891 Unspecified atrial fibrillation: Secondary | ICD-10-CM

## 2013-04-15 LAB — CBC
HCT: 42.5 % (ref 39.0–52.0)
Hemoglobin: 13.6 g/dL (ref 13.0–17.0)
MCV: 97.9 fL (ref 78.0–100.0)
RDW: 15.3 % (ref 11.5–15.5)
WBC: 8.2 10*3/uL (ref 4.0–10.5)

## 2013-04-15 LAB — BASIC METABOLIC PANEL
BUN: 25 mg/dL — ABNORMAL HIGH (ref 6–23)
CO2: 28 mEq/L (ref 19–32)
Chloride: 102 mEq/L (ref 96–112)
Creatinine, Ser: 1.2 mg/dL (ref 0.50–1.35)
GFR calc Af Amer: 60 mL/min — ABNORMAL LOW (ref >90)
Potassium: 3.4 mEq/L — ABNORMAL LOW (ref 3.5–5.1)

## 2013-04-15 MED ORDER — POTASSIUM CHLORIDE CRYS ER 20 MEQ PO TBCR
40.0000 meq | EXTENDED_RELEASE_TABLET | ORAL | Status: AC
  Administered 2013-04-15: 40 meq via ORAL
  Administered 2013-04-16: 20 meq via ORAL
  Filled 2013-04-15 (×2): qty 2

## 2013-04-15 MED ORDER — FUROSEMIDE 10 MG/ML IJ SOLN
40.0000 mg | Freq: Once | INTRAMUSCULAR | Status: AC
  Administered 2013-04-15: 40 mg via INTRAVENOUS
  Filled 2013-04-15: qty 4

## 2013-04-15 NOTE — Progress Notes (Signed)
[facility] PROGRESS NOTE  [name] RUE:[id number] DOB: [birth year] DOA: 04/13/2013 PCP: [name], MD  Assessment/Plan: 1. Clostridium difficile. Patient was recently in the hospital, which would put him at risk for infection. He was started on Flagyl for C. difficile. Clinically he appears to be improving. Continue current treatments. 2. Encephalopathy. Felt to be due to Flexeril which was discontinued. He did develop some respiratory acidosis which has since improved. 3. Chronic respiratory failure. Appears to be stable. 4. Left lower extremity edema. X-ray of ankle is unremarkable, venous Dopplers are negative for DVT. Will restart the patient on Lasix. 5. Atrial fibrillation. Rate controlled. Not a candidate for anticoagulation. 6. Chronic combined congestive heart failure. Back on home dose of Lasix. This was initially held on admission due to hypotension  Code Status: full code Family Communication: discussed with daughter Disposition Plan: Anticipate discharge home tomorrow  Consultants:  none  Procedures:  none  Antibiotics:  Flagyl 10/22  HPI/Subjective: Awake, alert, wants to go home, but feels breathing is approaching baseline.  Objective: Filed Vitals:   04/15/13 1502  BP: 114/75  Pulse: 76  Temp: 97.5 F (36.4 C)  Resp: 18    Intake/Output Summary (Last 24 hours) at 04/15/13 1957 Last data filed at 04/15/13 1759  Gross per 24 hour  Intake    240 ml  Output    100 ml  Net    140 ml   Filed Weights   04/13/13 1653 04/13/13 2150  Weight: 107.049 kg (236 lb) 103.7 kg (228 lb 9.9 oz)    Exam:   General:  NAD, has mild increased work of breathing, although this may be positional.  Cardiovascular: s1, s2, irregular  Respiratory: Crackles at bases  Abdomen: soft, nt, nd, bs+  Musculoskeletal: left leg is more edematous than right.   Data Reviewed: Basic Metabolic Panel:  Recent Labs Lab 04/13/13 1728 04/14/13 0619  04/15/13 0547  NA 138 142 140  K 3.6 3.7 3.4*  CL 98 102 102  CO2 30 28 28   GLUCOSE 87 73 96  BUN 22 23 25*  CREATININE 1.12 1.08 1.20  CALCIUM 8.6 8.5 8.7   Liver Function Tests:  Recent Labs Lab 04/13/13 1728 04/14/13 0619  AST 23 21  ALT 12 10  ALKPHOS 55 49  BILITOT 0.9 0.9  PROT 6.6 6.3  ALBUMIN 3.5 3.3*   No results found for this basename: LIPASE, AMYLASE,  in the last 168 hours No results found for this basename: AMMONIA,  in the last 168 hours CBC:  Recent Labs Lab 04/13/13 1728 04/14/13 0619 04/15/13 0547  WBC 9.5 8.5 8.2  NEUTROABS 7.4  --   --   HGB 13.7 13.7 13.6  HCT 43.3 43.2 42.5  MCV 98.4 98.2 97.9  PLT 126* 124* 137*   Cardiac Enzymes:  Recent Labs Lab 04/13/13 1728  TROPONINI <0.30   BNP (last 3 results)  Recent Labs  08/27/12 1348 04/02/13 1123 04/13/13 1728  PROBNP 8686.0* 4668.0* 6401.0*   CBG: No results found for this basename: GLUCAP,  in the last 168 hours  Recent Results (from the past 240 hour(s))  CLOSTRIDIUM DIFFICILE BY PCR     Status: Abnormal   Collection Time    04/13/13  8:10 PM      Result Value Range Status   C difficile by pcr POSITIVE (*) NEGATIVE Final   Comment: RESULT CALLED TO, READ BACK BY AND VERIFIED WITH:     HEARN J AT 2110 ON  [id number] BY FORSYTH K     Studies: Dg Ankle Complete Right  04/13/2013   CLINICAL DATA:  Right ankle pain.  EXAM: RIGHT ANKLE - COMPLETE 3+ VIEW  COMPARISON:  None.  FINDINGS: There is no evidence of fracture, dislocation, or joint effusion. There is no evidence of arthropathy or other focal bone abnormality. Soft tissues are unremarkable.  IMPRESSION: Normal right ankle.   Electronically Signed   By: Roque Lias M.D.   On: 04/13/2013 21:03   US Venous Img Lower Unilateral Left  04/14/2013   CLINICAL DATA:  Edema. History of prostate carcinoma.  EXAM: LEFT LOWER EXTREMITY VENOUS DOPPLER ULTRASOUND  TECHNIQUE: Gray-scale sonography with compression, as well as color and  duplex ultrasound, were performed to evaluate the deep venous system from the level of the common femoral vein through the popliteal and proximal calf veins.  COMPARISON:  None  FINDINGS: Normal compressibility of the common femoral, superficial femoral, and popliteal veins, as well as the proximal calf veins. No filling defects to suggest DVT on grayscale or color Doppler imaging. Doppler waveforms show normal direction of venous flow, normal respiratory phasicity and response to augmentation.  IMPRESSION: No evidence of  lower extremity deep vein thrombosis.   Electronically Signed   By: Oley Balm M.D.   On: 04/14/2013 13:36    Scheduled Meds: . albuterol  2.5 mg Nebulization Q6H  . cycloSPORINE  1 drop Both Eyes BID  . digoxin  0.125 mg Oral Daily  . enoxaparin (LOVENOX) injection  40 mg Subcutaneous Q24H  . furosemide  40 mg Intravenous Once  . furosemide  60 mg Oral Daily  . ipratropium  0.5 mg Nebulization Q6H  . metroNIDAZOLE  500 mg Oral Q8H  . potassium chloride  40 mEq Oral Q3H  . simvastatin  20 mg Oral Custom  . tamsulosin  0.4 mg Oral QHS   Continuous Infusions:   Principal Problem:   Diarrhea Active Problems:   Chronic respiratory failure   Atrial fibrillation, chronic   Arteriosclerotic cardiovascular disease (ASCVD)   CHF (congestive heart failure)   Acute encephalopathy   C. difficile colitis   Left leg swelling    Time spent:    Marcianne Ozbun  [facility] Pager [phone]. If 7PM-7AM, please contact night-coverage at [email or web address], password [facility] 04/15/2013, 7:57 PM  LOS: 2 days

## 2013-04-16 ENCOUNTER — Ambulatory Visit: Admitting: Family Medicine

## 2013-04-16 DIAGNOSIS — I5042 Chronic combined systolic (congestive) and diastolic (congestive) heart failure: Secondary | ICD-10-CM | POA: Diagnosis present

## 2013-04-16 DIAGNOSIS — E872 Acidosis: Secondary | ICD-10-CM | POA: Diagnosis not present

## 2013-04-16 DIAGNOSIS — I5040 Unspecified combined systolic (congestive) and diastolic (congestive) heart failure: Secondary | ICD-10-CM

## 2013-04-16 DIAGNOSIS — J9620 Acute and chronic respiratory failure, unspecified whether with hypoxia or hypercapnia: Secondary | ICD-10-CM

## 2013-04-16 LAB — BASIC METABOLIC PANEL
CO2: 30 mEq/L (ref 19–32)
Calcium: 8.7 mg/dL (ref 8.4–10.5)
Chloride: 104 mEq/L (ref 96–112)
Creatinine, Ser: 1.21 mg/dL (ref 0.50–1.35)
Glucose, Bld: 103 mg/dL — ABNORMAL HIGH (ref 70–99)
Potassium: 3.5 mEq/L (ref 3.5–5.1)

## 2013-04-16 MED ORDER — METRONIDAZOLE 500 MG PO TABS: 500.0000 mg | ORAL_TABLET | Freq: Three times a day (TID) | ORAL | Status: DC

## 2013-04-16 NOTE — Clinical Social Work Psychosocial (Signed)
    Clinical Social Work Department BRIEF PSYCHOSOCIAL ASSESSMENT 04/16/2013  Patient:  [name], [name]     Account Number:  [id number]     Admit date:  04/13/2013  Clinical Social Worker:  Santa Genera, CLINICAL SOCIAL WORKER  Date/Time:  04/16/2013 12:00 N  Referred by:  RN  Date Referred:  04/16/2013 Referred for  Other - See comment   Other Referral:   Daughter/caregiver unsure she can take patient home in current condition, wanted info on SNF   Interview type:  Family Other interview type:    PSYCHOSOCIAL DATA Living Status:  FAMILY Admitted from facility:   Level of care:   Primary support name:  Naida Sleight Primary support relationship to patient:  CHILD, ADULT Degree of support available:   Daughter lives w patient, is primary caregiver.  Concerned that she is unable to provide care he needs at present.    CURRENT CONCERNS Current Concerns  Post-Acute Placement   Other Concerns:    SOCIAL WORK ASSESSMENT / PLAN CSW met w daughter at RN request, daughter had expressed concern about taking patient home as he is much weaker than before admission.  Has C diff, daughter concerned that patient cannot get out of bed quickly enough to use bedside commode.  Daughter has lived w patient for approx 11 years, is his primary caregiver.  Does not work because she cannot guarantee her availability to employer due to father's care needs.  [facility] from his home to [facility] facility - at this point, he has stopped [facility] because [facility] cannot get [facility] any [facility].    Daughter says she has a bad back and has difficulty lifting.  Says when patient is well, he is fairly independent within the home - is able to transfer from bed to chair, use bedside commode, walks w walker through several rooms in home.  [place] to [place].  Has difficulty getting in/out of car,  daughter says she has no one to assist w getting patient out of car when they arrive at home, patient fell face first into snow because he could not get out of car successfully.    Daughter says patient wants to return home, is willing to try to care for him.  Wants RN CM to ask [facility] PT to be "less aggressive" - last PT resulted in severe leg soreness, patient did not want to get out of bed as a result and stopped eating/drinking/taking medicines because he did not want to have to get out of bed to use commode.    CSW discussed options w daughter, referred to RN CM as patient and daughter prefer to return home at discharge.   Assessment/plan status:  No Further Intervention Required Other assessment/ plan:   Information/referral to community resources:   RN CM to arrange home health needs    PATIENT'S/FAMILY'S RESPONSE TO PLAN OF CARE: Daughter appreciative of help w arranging discharge needs.    Santa Genera, LCSW Clinical Social Worker [phone])

## 2013-04-16 NOTE — Progress Notes (Signed)
Patient with orders to be discharge home. Discharge instructions given to daughter, verbalized understanding. Patient stable. Patient left in private vehicle with daughter.  

## 2013-04-16 NOTE — Clinical Social Work Note (Signed)
Daughter declines EMS transport to home, says she is calling friend to assist w getting patient out of care at home.  CSW signing off as no further SW needs identified at this time.  Santa Genera, LCSW Clinical Social Worker [phone])

## 2013-04-16 NOTE — Discharge Summary (Signed)
Physician Discharge Summary  [name] HYQ:[id number] DOB: 06-21-1922 DOA: 04/13/2013  PCP: Leonard Overman, MD  Admit date: 04/13/2013 Discharge date: 04/16/2013  Time spent: 40 minutes  Recommendations for Outpatient Follow-up:  1. Discharge home with home health services 2. Follow up with primary care physician in 1-2 weeks  Discharge Diagnoses:  Principal Problem:   C. difficile colitis Active Problems:   Atrial fibrillation, chronic   Arteriosclerotic cardiovascular disease (ASCVD)   Acute-on-chronic respiratory failure   Acute encephalopathy   Left leg swelling   Diarrhea   Respiratory acidosis   Chronic combined systolic and diastolic CHF (congestive heart failure)   Discharge Condition: improved  Diet recommendation: low salt,  Filed Weights   04/13/13 1653 04/13/13 2150  Weight: 107.049 kg (236 lb) 103.7 kg (228 lb 9.9 oz)    History of present illness:  79 year old male with significant medical problems including COPD, CHF, CAD, chronic respiratory failure on home oxygen, history of retroperitoneal hematoma, abdominal aortic aneurysm who was recently discharged from the hospital after being treated for pneumonia. Patient was sent home on Levaquin for 2 more days. As per daughter who is accompanying the patient patient was doing fine for initial few days till he started getting physical therapy. As per patient's daughter the physical therapist worked on patient's left leg, and it caused swelling of the left leg. Since that time patient has not been able to walk and has been avoiding any movement due to the left thigh pain. Patient also developed diarrhea 3 days ago, though there was no blood in it and it was watery. Patient was having 7-8" meds everyday. He denies any abdominal pain no nausea vomiting. She denies chest pain or shortness of breath he does have congestive heart failure and has been taking his medications as prescribed. He denies fever no dizziness  no fall   Hospital Course:  This patient with multiple medical problems, was admitted to the hospital with diarrhea as well as left leg pain. He was found to have C. difficile colitis. He was recently on Levaquin for pneumonia. Patient is to start oral Flagyl and his diarrhea has since improved. He is not febrile has no leukocytosis. He'll complete a two-week course of Flagyl. Regarding his leg pain, Dopplers were negative for underlying DVT, x-ray films were also unremarkable. He was started back on his home dose of Lasix. His overall respiratory status as well as lower extremity edema have improved. It initially was felt that patient may have some muscle spasm in his leg. He was started on Flexeril and subsequently became encephalopathic. ABG was checked which showed respiratory acidosis. Patient was briefly placed on BiPAP but quickly improved. Flexeril has since been discontinued and he has returned to his baseline mental status. Patient appears to be approaching his baseline level functioning. Daughter wishes to take him home to continue care.  Procedures:  none  Consultations:  none  Discharge Exam: Filed Vitals:   04/16/13 1044  BP:   Pulse: 80  Temp:   Resp:     General: NAD Cardiovascular: s1, s2 irregular Respiratory: crackles at bases  Discharge Instructions      Discharge Orders   Future Appointments Provider Department Dept Phone   05/05/2013 11:00 AM [name] [phone]   07/02/2013 11:00 AM [facility] [phone]   07/05/2013 11:30 AM Ellouise Newer, PA-C [facility] [phone]   Future Orders Complete By Expires   Call MD  for:  difficulty breathing, headache or visual disturbances  As directed    Call MD for:  persistant nausea and vomiting  As directed    Call MD for:  temperature >100.4  As directed    Diet - low sodium heart healthy  As directed    Increase activity slowly  As directed         Medication List    STOP taking these medications       levofloxacin 750 MG tablet  Commonly known as:  LEVAQUIN      TAKE these medications       albuterol (2.5 MG/3ML) 0.083% nebulizer solution  Commonly known as:  PROVENTIL  Take 2.5 mg by nebulization every 6 (six) hours as needed for wheezing or shortness of breath.     CENTRUM SILVER tablet  Take 1 tablet by mouth daily.     cycloSPORINE 0.05 % ophthalmic emulsion  Commonly known as:  RESTASIS  Place 1 drop into both eyes 2 (two) times daily.     digoxin 0.125 MG tablet  Commonly known as:  LANOXIN  Take 0.125 mg by mouth daily.     Fluticasone-Salmeterol 100-50 MCG/DOSE Aepb  Commonly known as:  ADVAIR  Inhale 1 puff into the lungs every 12 (twelve) hours.     furosemide 20 MG tablet  Commonly known as:  LASIX  Take 60 mg by mouth daily.     guaifenesin 400 MG Tabs tablet  Commonly known as:  HUMIBID E  Take 400 mg by mouth 2 (two) times daily. Cough/congestion     ipratropium 0.02 % nebulizer solution  Commonly known as:  ATROVENT  Take 250 mcg by nebulization every 6 (six) hours.     lovastatin 40 MG tablet  Commonly known as:  MEVACOR  Take 40 mg by mouth 4 (four) times a week. Patient takes on Sunday,Monday,Wednesday,Friday     metroNIDAZOLE 500 MG tablet  Commonly known as:  FLAGYL  Take 1 tablet (500 mg total) by mouth 3 (three) times daily.     omeprazole 40 MG capsule  Commonly known as:  PRILOSEC  Take 40 mg by mouth daily.     potassium chloride SA 20 MEQ tablet  Commonly known as:  K-DUR,KLOR-CON  Take 20 mEq by mouth 2 (two) times daily.     tamsulosin 0.4 MG Caps capsule  Commonly known as:  FLOMAX  Take 0.4 mg by mouth at bedtime.       Allergies  Allergen Reactions  . Aspirin   . Gabapentin     Patient cannot take over 200 mg dose at a time as it causes jerking or spasms  . Naproxen    Follow-up Information   Follow up with Leonard Overman, MD. Schedule an  appointment as soon as possible for a visit in 2 weeks.   Specialty:  Family Medicine   Contact information:   [address] [place] [address] [phone]        The results of significant diagnostics from this hospitalization (including imaging, microbiology, ancillary and laboratory) are listed below for reference.    Significant Diagnostic Studies: Dg Chest 1 View  04/02/2013   CLINICAL DATA:  Right pleural effusion post thoracentesis  EXAM: CHEST - 1 VIEW  COMPARISON:  Expiratory AP exam compared to 03/31/2013  FINDINGS: Enlargement of cardiac silhouette post median sternotomy.  Atherosclerotic calcification aorta.  Pulmonary vascular congestion.  Persistent right basilar effusion despite removal of 1200 mL of  fluid during thoracentesis.  No left pleural effusion identified.  Mild right basilar atelectasis.  Lungs otherwise clear.  No pneumothorax.  IMPRESSION: No pneumothorax following right thoracentesis.  Small right pleural effusion and basilar atelectasis.   Electronically Signed   By: Ulyses Southward M.D.   On: 04/02/2013 12:27   Dg Ankle Complete Right  04/13/2013   CLINICAL DATA:  Right ankle pain.  EXAM: RIGHT ANKLE - COMPLETE 3+ VIEW  COMPARISON:  None.  FINDINGS: There is no evidence of fracture, dislocation, or joint effusion. There is no evidence of arthropathy or other focal bone abnormality. Soft tissues are unremarkable.  IMPRESSION: Normal right ankle.   Electronically Signed   By: Roque Lias M.D.   On: 04/13/2013 21:03   Ct Chest W Contrast  04/01/2013   CLINICAL DATA:  Shortness of breath for the past 3 weeks. Advanced COPD.  EXAM: CT CHEST WITH CONTRAST  TECHNIQUE: Multidetector CT imaging of the chest was performed during intravenous contrast administration.  CONTRAST:  80mL OMNIPAQUE IOHEXOL 300 MG/ML  SOLN  COMPARISON:  Chest CT 07/21/2012.  FINDINGS: Mediastinum: Heart size is enlarged with marked dilatation of the right atrium, right ventricle and  left atrium. Additionally, the right ventricular wall appears hypertrophied, and there is dilatation of the pulmonic trunk (5.4 cm in diameter), strongly indicative of pulmonary arterial hypertension. Straightening of the interventricular septum, suggesting some degree of right-sided heart strain. There is no significant pericardial fluid, thickening or pericardial calcification. There is atherosclerosis of the thoracic aorta, the great vessels of the mediastinum and the coronary arteries, including calcified atherosclerotic plaque in the left main, left anterior descending, left circumflex and right coronary arteries. Status post median sternotomy for CABG including LIMA to the LAD, as well as mitral valve replacement (a stented bioprosthesis is noted). Calcifications of the aortic valve. No pathologically enlarged mediastinal or hilar lymph nodes. Esophagus is unremarkable in appearance.  Lungs/Pleura: Moderate right-sided pleural effusion with near complete passive atelectasis of the right lower lobe and complete passive atelectasis of the right middle lobe as well. Abrupt cut off of bronchus intermedius is noted on images 32-33 of series 3. This appears related to retained secretions, as there is no definite extrinsic compression of the bronchus at this level. Minimal subsegmental atelectasis in the inferior aspect the left lower lobe.  Upper Abdomen: Perihepatic and perisplenic ascites. Low-attenuation lesion in the left upper retroperitoneum measuring at least 4.6 cm in diameter, favored to represent an exophytic left renal cyst. This is similar to the prior examination.  Musculoskeletal: Sternotomy wires. There are no aggressive appearing lytic or blastic lesions noted in the visualized portions of the skeleton.  IMPRESSION: 1. Cardiomegaly with evidence of pulmonary arterial hypertension and right-sided heart strain, as discussed above. 2. Moderate right-sided pleural effusion with extensive passive  atelectasis in the right middle and lower lobes, as above. 3. Ascites. 4. Atherosclerosis, including left main and 3 vessel coronary artery disease. Status post median sternotomy for CABG, including LIMA to the LAD. 5. Additional incidental findings, as above.   Electronically Signed   By: Trudie Reed M.D.   On: 04/01/2013 15:54   US Venous Img Lower Unilateral Left  04/14/2013   CLINICAL DATA:  Edema. History of prostate carcinoma.  EXAM: LEFT LOWER EXTREMITY VENOUS DOPPLER ULTRASOUND  TECHNIQUE: Gray-scale sonography with compression, as well as color and duplex ultrasound, were performed to evaluate the deep venous system from the level of the common femoral vein through the popliteal and proximal  calf veins.  COMPARISON:  None  FINDINGS: Normal compressibility of the common femoral, superficial femoral, and popliteal veins, as well as the proximal calf veins. No filling defects to suggest DVT on grayscale or color Doppler imaging. Doppler waveforms show normal direction of venous flow, normal respiratory phasicity and response to augmentation.  IMPRESSION: No evidence of  lower extremity deep vein thrombosis.   Electronically Signed   By: Oley Balm M.D.   On: 04/14/2013 13:36   Dg Chest Port 1 View  04/13/2013   CLINICAL DATA:  Orthostatic hypotension with diarrhea and weakness.  EXAM: PORTABLE CHEST - 1 VIEW  COMPARISON:  04/02/2013 and CT 04/01/2013  FINDINGS: Again noted is enlargement of the cardiac silhouette. Again noted is volume loss in right hemithorax with persistent right basilar densities. Again noted are prominent lung markings bilaterally. No evidence for a pneumothorax.  IMPRESSION: Minimal change from the previous examination. Persistent volume loss in the right hemithorax with right basilar densities.  Cardiomegaly.   Electronically Signed   By: Richarda Overlie M.D.   On: 04/13/2013 17:48   Dg Chest Port 1 View  03/31/2013   CLINICAL DATA:  Short of breath  EXAM: PORTABLE CHEST  - 1 VIEW  COMPARISON:  08/27/2012  FINDINGS: Severe cardiomegaly. There is opacity at the right lung base and shift of the mediastinum to the right likely a combination of right pleural effusion, right lower lobe collapse, and underlying airspace disease. Mild patchy densities at the left base. Vascular congestion. No pneumothorax.  IMPRESSION: Cardiomegaly.  Right basilar lung collapse, consolidation, and right pleural effusion.  Minimal airspace disease at the left base.   Electronically Signed   By: Maryclare Bean M.D.   On: 03/31/2013 20:07   US Thoracentesis Asp Pleural Space W/img Guide  04/02/2013   CLINICAL DATA:  Right pleural effusion, thoracentesis  EXAM: RADIOLOGY EXAMINATION  TECHNIQUE: Procedure, benefits, and risks of procedure were discussed with patient.  Written informed consent for procedure was obtained.  Time out protocol followed.  Pleural effusion localized at the posterior right hemi thorax.  Skin prepped and draped in usual sterile fashion.  Skin and soft tissues anesthetized with 6.5 mL of 1% lidocaine.  8 French thoracentesis catheter placed into the right pleural space.  1200 mL of clear yellow fluid aspirated by syringe pump.  Procedure tolerated well by patient without immediate complication.  COMPARISON:  Chest radiograph 03/31/2013, CT chest 04/01/2013  FINDINGS: As above  IMPRESSION: Right thoracentesis as above.  Fluid sent to laboratory for requested analysis.   Electronically Signed   By: Ulyses Southward M.D.   On: 04/02/2013 12:32    Microbiology: Recent Results (from the past 240 hour(s))  CLOSTRIDIUM DIFFICILE BY PCR     Status: Abnormal   Collection Time    04/13/13  8:10 PM      Result Value Range Status   C difficile by pcr POSITIVE (*) NEGATIVE Final   Comment: RESULT CALLED TO, READ BACK BY AND VERIFIED WITH:     HEARN J AT 2110 ON [id number] BY FORSYTH K  STOOL CULTURE     Status: None   Collection Time    04/14/13  7:05 AM      Result Value Range Status   Specimen  Description STOOL   Final   Special Requests NONE   Final   Culture     Final   Value: NO SUSPICIOUS COLONIES, CONTINUING TO HOLD     Note: REDUCED NORMAL FLORA  PRESENT     Performed at [facility]   Report Status PENDING   Incomplete     Labs: Basic Metabolic Panel:  Recent Labs Lab 04/13/13 1728 04/14/13 0619 04/15/13 0547 04/16/13 0524  NA 138 142 140 143  K 3.6 3.7 3.4* 3.5  CL 98 102 102 104  CO2 30 28 28 30   GLUCOSE 87 73 96 103*  BUN 22 23 25* 25*  CREATININE 1.12 1.08 1.20 1.21  CALCIUM 8.6 8.5 8.7 8.7   Liver Function Tests:  Recent Labs Lab 04/13/13 1728 04/14/13 0619  AST 23 21  ALT 12 10  ALKPHOS 55 49  BILITOT 0.9 0.9  PROT 6.6 6.3  ALBUMIN 3.5 3.3*   No results found for this basename: LIPASE, AMYLASE,  in the last 168 hours No results found for this basename: AMMONIA,  in the last 168 hours CBC:  Recent Labs Lab 04/13/13 1728 04/14/13 0619 04/15/13 0547  WBC 9.5 8.5 8.2  NEUTROABS 7.4  --   --   HGB 13.7 13.7 13.6  HCT 43.3 43.2 42.5  MCV 98.4 98.2 97.9  PLT 126* 124* 137*   Cardiac Enzymes:  Recent Labs Lab 04/13/13 1728  TROPONINI <0.30   BNP: BNP (last 3 results)  Recent Labs  08/27/12 1348 04/02/13 1123 04/13/13 1728  PROBNP 8686.0* 4668.0* 6401.0*   CBG: No results found for this basename: GLUCAP,  in the last 168 hours     Signed:  Lashay Osborne  [facility] 04/16/2013, 12:21 PM

## 2013-04-18 LAB — STOOL CULTURE

## 2013-04-26 ENCOUNTER — Other Ambulatory Visit: Payer: Self-pay | Admitting: Family Medicine

## 2013-04-30 ENCOUNTER — Ambulatory Visit: Admitting: Family Medicine

## 2013-05-03 ENCOUNTER — Other Ambulatory Visit: Payer: Self-pay | Admitting: Family Medicine

## 2013-05-05 ENCOUNTER — Ambulatory Visit: Admitting: Family Medicine

## 2013-05-05 ENCOUNTER — Other Ambulatory Visit (HOSPITAL_COMMUNITY)

## 2013-05-06 ENCOUNTER — Telehealth: Payer: Self-pay

## 2013-05-06 NOTE — Telephone Encounter (Signed)
Daughter called to discuss pt's swelling of the left leg.  Relates hx of pt. being in the hospital approx 2 weeks ago, and had an U/S of his left leg to r/o blood clot.  Was informed there was no blood clot @ that time.  States that since he has come home from hospital, there has continued to be swelling in the left leg, from left foot to the knee.  Daughter stated that it hadn't seemed to be getting any better, but that after elevating last night, there was some improvement today.  Denies that pt. has any swelling in the right leg.  Denies any S.O.B. or cough.  Advised on the importance of elevating above the level of the heart at intervals during the day.  Daughter stated pt. was to f/u with his PCP on 11/11, but she wasn't able to transport him by herself in her car, so she cancelled that appt.  Encouraged daughter to schedule a f/u appt. to have pt. checked due to diagnosis of CHF.  Daughter stated she will contact a transportation service to arrange to have pt. transported to his PCP for evaluation.

## 2013-05-06 NOTE — Telephone Encounter (Signed)
Do you want to increase lasix to 80mg  for 3 days and leave potassium as is?

## 2013-05-06 NOTE — Telephone Encounter (Signed)
Increase for 2 days only please, and elevate swollen foot

## 2013-05-08 ENCOUNTER — Inpatient Hospital Stay (HOSPITAL_COMMUNITY)
Admission: EM | Admit: 2013-05-08 | Discharge: 2013-05-13 | DRG: 291 | Disposition: A | Discharge status: Hospice | Payer: Medicare Other | Attending: Internal Medicine | Admitting: Internal Medicine

## 2013-05-08 ENCOUNTER — Encounter (HOSPITAL_COMMUNITY): Payer: Self-pay | Admitting: Emergency Medicine

## 2013-05-08 ENCOUNTER — Emergency Department (HOSPITAL_COMMUNITY): Payer: Medicare Other

## 2013-05-08 DIAGNOSIS — J9600 Acute respiratory failure, unspecified whether with hypoxia or hypercapnia: Secondary | ICD-10-CM

## 2013-05-08 DIAGNOSIS — I739 Peripheral vascular disease, unspecified: Secondary | ICD-10-CM

## 2013-05-08 DIAGNOSIS — J4489 Other specified chronic obstructive pulmonary disease: Secondary | ICD-10-CM | POA: Diagnosis present

## 2013-05-08 DIAGNOSIS — R29898 Other symptoms and signs involving the musculoskeletal system: Secondary | ICD-10-CM

## 2013-05-08 DIAGNOSIS — I5043 Acute on chronic combined systolic (congestive) and diastolic (congestive) heart failure: Secondary | ICD-10-CM

## 2013-05-08 DIAGNOSIS — Z9981 Dependence on supplemental oxygen: Secondary | ICD-10-CM

## 2013-05-08 DIAGNOSIS — I350 Nonrheumatic aortic (valve) stenosis: Secondary | ICD-10-CM

## 2013-05-08 DIAGNOSIS — Z8546 Personal history of malignant neoplasm of prostate: Secondary | ICD-10-CM

## 2013-05-08 DIAGNOSIS — I482 Chronic atrial fibrillation, unspecified: Secondary | ICD-10-CM

## 2013-05-08 DIAGNOSIS — I079 Rheumatic tricuspid valve disease, unspecified: Secondary | ICD-10-CM | POA: Diagnosis present

## 2013-05-08 DIAGNOSIS — R0609 Other forms of dyspnea: Secondary | ICD-10-CM

## 2013-05-08 DIAGNOSIS — Z79899 Other long term (current) drug therapy: Secondary | ICD-10-CM

## 2013-05-08 DIAGNOSIS — I071 Rheumatic tricuspid insufficiency: Secondary | ICD-10-CM | POA: Diagnosis present

## 2013-05-08 DIAGNOSIS — E1169 Type 2 diabetes mellitus with other specified complication: Secondary | ICD-10-CM | POA: Diagnosis present

## 2013-05-08 DIAGNOSIS — E739 Lactose intolerance, unspecified: Secondary | ICD-10-CM | POA: Diagnosis present

## 2013-05-08 DIAGNOSIS — Z87891 Personal history of nicotine dependence: Secondary | ICD-10-CM

## 2013-05-08 DIAGNOSIS — J449 Chronic obstructive pulmonary disease, unspecified: Secondary | ICD-10-CM | POA: Diagnosis present

## 2013-05-08 DIAGNOSIS — I714 Abdominal aortic aneurysm, without rupture, unspecified: Secondary | ICD-10-CM | POA: Diagnosis present

## 2013-05-08 DIAGNOSIS — J441 Chronic obstructive pulmonary disease with (acute) exacerbation: Secondary | ICD-10-CM

## 2013-05-08 DIAGNOSIS — H9190 Unspecified hearing loss, unspecified ear: Secondary | ICD-10-CM | POA: Diagnosis present

## 2013-05-08 DIAGNOSIS — I5042 Chronic combined systolic (congestive) and diastolic (congestive) heart failure: Secondary | ICD-10-CM

## 2013-05-08 DIAGNOSIS — R269 Unspecified abnormalities of gait and mobility: Secondary | ICD-10-CM

## 2013-05-08 DIAGNOSIS — Z954 Presence of other heart-valve replacement: Secondary | ICD-10-CM

## 2013-05-08 DIAGNOSIS — I5033 Acute on chronic diastolic (congestive) heart failure: Secondary | ICD-10-CM

## 2013-05-08 DIAGNOSIS — I2510 Atherosclerotic heart disease of native coronary artery without angina pectoris: Secondary | ICD-10-CM | POA: Diagnosis present

## 2013-05-08 DIAGNOSIS — I2789 Other specified pulmonary heart diseases: Secondary | ICD-10-CM | POA: Diagnosis present

## 2013-05-08 DIAGNOSIS — I10 Essential (primary) hypertension: Secondary | ICD-10-CM | POA: Diagnosis present

## 2013-05-08 DIAGNOSIS — I4891 Unspecified atrial fibrillation: Secondary | ICD-10-CM | POA: Diagnosis present

## 2013-05-08 DIAGNOSIS — R0600 Dyspnea, unspecified: Secondary | ICD-10-CM

## 2013-05-08 DIAGNOSIS — I719 Aortic aneurysm of unspecified site, without rupture: Secondary | ICD-10-CM

## 2013-05-08 DIAGNOSIS — Z951 Presence of aortocoronary bypass graft: Secondary | ICD-10-CM

## 2013-05-08 DIAGNOSIS — M1990 Unspecified osteoarthritis, unspecified site: Secondary | ICD-10-CM | POA: Diagnosis present

## 2013-05-08 DIAGNOSIS — E162 Hypoglycemia, unspecified: Secondary | ICD-10-CM

## 2013-05-08 DIAGNOSIS — Z823 Family history of stroke: Secondary | ICD-10-CM

## 2013-05-08 DIAGNOSIS — D509 Iron deficiency anemia, unspecified: Secondary | ICD-10-CM | POA: Diagnosis present

## 2013-05-08 DIAGNOSIS — E785 Hyperlipidemia, unspecified: Secondary | ICD-10-CM | POA: Diagnosis present

## 2013-05-08 DIAGNOSIS — K219 Gastro-esophageal reflux disease without esophagitis: Secondary | ICD-10-CM | POA: Diagnosis present

## 2013-05-08 DIAGNOSIS — J9620 Acute and chronic respiratory failure, unspecified whether with hypoxia or hypercapnia: Secondary | ICD-10-CM | POA: Diagnosis present

## 2013-05-08 DIAGNOSIS — I509 Heart failure, unspecified: Secondary | ICD-10-CM | POA: Diagnosis present

## 2013-05-08 DIAGNOSIS — I252 Old myocardial infarction: Secondary | ICD-10-CM

## 2013-05-08 DIAGNOSIS — E039 Hypothyroidism, unspecified: Secondary | ICD-10-CM | POA: Diagnosis present

## 2013-05-08 DIAGNOSIS — I2723 Pulmonary hypertension due to lung diseases and hypoxia: Secondary | ICD-10-CM | POA: Diagnosis present

## 2013-05-08 DIAGNOSIS — D696 Thrombocytopenia, unspecified: Secondary | ICD-10-CM | POA: Diagnosis present

## 2013-05-08 DIAGNOSIS — I059 Rheumatic mitral valve disease, unspecified: Secondary | ICD-10-CM | POA: Diagnosis present

## 2013-05-08 DIAGNOSIS — E669 Obesity, unspecified: Secondary | ICD-10-CM | POA: Diagnosis present

## 2013-05-08 DIAGNOSIS — I5023 Acute on chronic systolic (congestive) heart failure: Principal | ICD-10-CM | POA: Diagnosis present

## 2013-05-08 DIAGNOSIS — Z8249 Family history of ischemic heart disease and other diseases of the circulatory system: Secondary | ICD-10-CM

## 2013-05-08 HISTORY — DX: Other specified soft tissue disorders: M79.89

## 2013-05-08 HISTORY — DX: Enterocolitis due to Clostridium difficile, not specified as recurrent: A04.72

## 2013-05-08 HISTORY — DX: Hypothyroidism, unspecified: E03.9

## 2013-05-08 HISTORY — DX: Dependence on supplemental oxygen: Z99.81

## 2013-05-08 LAB — CBC WITH DIFFERENTIAL/PLATELET
Band Neutrophils: 0 % (ref 0–10)
Basophils Absolute: 0.1 10*3/uL (ref 0.0–0.1)
Basophils Relative: 2 % — ABNORMAL HIGH (ref 0–1)
Eosinophils Absolute: 0 10*3/uL (ref 0.0–0.7)
Eosinophils Relative: 0 % (ref 0–5)
HCT: 44.8 % (ref 39.0–52.0)
Hemoglobin: 14.2 g/dL (ref 13.0–17.0)
MCV: 100 fL (ref 78.0–100.0)
Metamyelocytes Relative: 0 %
Monocytes Absolute: 1.4 10*3/uL — ABNORMAL HIGH (ref 0.1–1.0)
Monocytes Relative: 19 % — ABNORMAL HIGH (ref 3–12)
Neutrophils Relative %: 73 % (ref 43–77)
Promyelocytes Absolute: 0 %
RBC: 4.48 MIL/uL (ref 4.22–5.81)
RDW: 16.4 % — ABNORMAL HIGH (ref 11.5–15.5)
WBC: 7.4 10*3/uL (ref 4.0–10.5)

## 2013-05-08 LAB — BASIC METABOLIC PANEL
BUN: 21 mg/dL (ref 6–23)
Calcium: 9.3 mg/dL (ref 8.4–10.5)
Creatinine, Ser: 1.23 mg/dL (ref 0.50–1.35)
GFR calc Af Amer: 58 mL/min — ABNORMAL LOW (ref >90)
GFR calc non Af Amer: 50 mL/min — ABNORMAL LOW (ref >90)
Potassium: 4.4 mEq/L (ref 3.5–5.1)
Sodium: 137 mEq/L (ref 135–145)

## 2013-05-08 LAB — TROPONIN I: Troponin I: <0.3 ng/mL (ref <0.30)

## 2013-05-08 LAB — DIGOXIN LEVEL: Digoxin Level: 1.6 ng/mL (ref 0.8–2.0)

## 2013-05-08 LAB — PRO B NATRIURETIC PEPTIDE: Pro B Natriuretic peptide (BNP): 7103 pg/mL — ABNORMAL HIGH (ref 0–450)

## 2013-05-08 MED ORDER — FUROSEMIDE 10 MG/ML IJ SOLN
40.0000 mg | Freq: Two times a day (BID) | INTRAMUSCULAR | Status: DC
  Administered 2013-05-08 – 2013-05-13 (×10): 40 mg via INTRAVENOUS
  Filled 2013-05-08 (×10): qty 4

## 2013-05-08 MED ORDER — SODIUM CHLORIDE 0.9 % IV SOLN
250.0000 mL | INTRAVENOUS | Status: DC | PRN
  Administered 2013-05-12: 250 mL via INTRAVENOUS

## 2013-05-08 MED ORDER — CYCLOSPORINE 0.05 % OP EMUL
1.0000 [drp] | Freq: Two times a day (BID) | OPHTHALMIC | Status: DC
  Administered 2013-05-09 – 2013-05-13 (×9): 1 [drp] via OPHTHALMIC
  Filled 2013-05-08 (×12): qty 1

## 2013-05-08 MED ORDER — DIGOXIN 125 MCG PO TABS
0.1250 mg | ORAL_TABLET | Freq: Every day | ORAL | Status: DC
  Administered 2013-05-09 – 2013-05-13 (×5): 0.125 mg via ORAL
  Filled 2013-05-08 (×5): qty 1

## 2013-05-08 MED ORDER — TAMSULOSIN HCL 0.4 MG PO CAPS
0.4000 mg | ORAL_CAPSULE | Freq: Every day | ORAL | Status: DC
  Administered 2013-05-08 – 2013-05-12 (×5): 0.4 mg via ORAL
  Filled 2013-05-08 (×5): qty 1

## 2013-05-08 MED ORDER — SODIUM CHLORIDE 0.9 % IV SOLN
INTRAVENOUS | Status: DC
  Administered 2013-05-08: 18:00:00 via INTRAVENOUS

## 2013-05-08 MED ORDER — SODIUM CHLORIDE 0.9 % IV SOLN: INTRAVENOUS | Status: DC

## 2013-05-08 MED ORDER — GUAIFENESIN ER 600 MG PO TB12
600.0000 mg | ORAL_TABLET | Freq: Two times a day (BID) | ORAL | Status: DC
  Administered 2013-05-08 – 2013-05-13 (×10): 600 mg via ORAL
  Filled 2013-05-08 (×10): qty 1

## 2013-05-08 MED ORDER — ONDANSETRON HCL 4 MG/2ML IJ SOLN: 4.0000 mg | Freq: Four times a day (QID) | INTRAMUSCULAR | Status: DC | PRN

## 2013-05-08 MED ORDER — ENOXAPARIN SODIUM 40 MG/0.4ML ~~LOC~~ SOLN
40.0000 mg | SUBCUTANEOUS | Status: DC
  Administered 2013-05-08 – 2013-05-12 (×5): 40 mg via SUBCUTANEOUS
  Filled 2013-05-08 (×6): qty 0.4

## 2013-05-08 MED ORDER — SODIUM CHLORIDE 0.9 % IJ SOLN: 3.0000 mL | INTRAMUSCULAR | Status: DC | PRN

## 2013-05-08 MED ORDER — ALBUTEROL SULFATE (5 MG/ML) 0.5% IN NEBU
5.0000 mg | INHALATION_SOLUTION | Freq: Once | RESPIRATORY_TRACT | Status: AC
  Administered 2013-05-08: 5 mg via RESPIRATORY_TRACT
  Filled 2013-05-08: qty 1

## 2013-05-08 MED ORDER — SODIUM CHLORIDE 0.9 % IJ SOLN
3.0000 mL | Freq: Two times a day (BID) | INTRAMUSCULAR | Status: DC
  Administered 2013-05-08 – 2013-05-13 (×9): 3 mL via INTRAVENOUS

## 2013-05-08 MED ORDER — POTASSIUM CHLORIDE CRYS ER 20 MEQ PO TBCR
20.0000 meq | EXTENDED_RELEASE_TABLET | Freq: Two times a day (BID) | ORAL | Status: DC
  Administered 2013-05-09 – 2013-05-13 (×9): 20 meq via ORAL
  Filled 2013-05-08 (×9): qty 1

## 2013-05-08 MED ORDER — IPRATROPIUM BROMIDE 0.02 % IN SOLN: 0.5000 mg | Freq: Four times a day (QID) | RESPIRATORY_TRACT | Status: DC | PRN

## 2013-05-08 MED ORDER — PANTOPRAZOLE SODIUM 40 MG PO TBEC
40.0000 mg | DELAYED_RELEASE_TABLET | Freq: Every day | ORAL | Status: DC
  Administered 2013-05-09 – 2013-05-13 (×5): 40 mg via ORAL
  Filled 2013-05-08 (×5): qty 1

## 2013-05-08 MED ORDER — IPRATROPIUM BROMIDE 0.02 % IN SOLN
0.5000 mg | Freq: Once | RESPIRATORY_TRACT | Status: AC
  Administered 2013-05-08: 0.5 mg via RESPIRATORY_TRACT
  Filled 2013-05-08: qty 2.5

## 2013-05-08 MED ORDER — ACETAMINOPHEN 325 MG PO TABS: 650.0000 mg | ORAL_TABLET | ORAL | Status: DC | PRN

## 2013-05-08 MED ORDER — FUROSEMIDE 10 MG/ML IJ SOLN
40.0000 mg | Freq: Once | INTRAMUSCULAR | Status: AC
  Administered 2013-05-08: 40 mg via INTRAVENOUS
  Filled 2013-05-08: qty 4

## 2013-05-08 MED ORDER — ALBUTEROL SULFATE (5 MG/ML) 0.5% IN NEBU
2.5000 mg | INHALATION_SOLUTION | RESPIRATORY_TRACT | Status: AC
  Administered 2013-05-08 – 2013-05-09 (×3): 2.5 mg via RESPIRATORY_TRACT
  Filled 2013-05-08 (×3): qty 0.5

## 2013-05-08 NOTE — ED Provider Notes (Signed)
CSN: [id number]     Arrival date & time 05/08/13  1607 History   First MD Initiated Contact with Patient 05/08/13 1704     Chief Complaint  Patient presents with  . Shortness of Breath    HPI Pt was seen at 1705. Per pt and his family, c/o gradual onset and worsening of persistent SOB and peripheral edema for the past 2 weeks. Pt's family states they called their PMD for same "but the office didn't call back." Has been wearing his usual O2 3L N/C and taking his home MDI and lasix without improvement in his symptoms. Denies CP/palpitations, no cough, no abd pain, no N/V/D, no back pain, no fevers.    Past Medical History  Diagnosis Date  . Mitral valve prolapse 2003    MVR in 2003  . Obesity   . Osteoarthritis   . Hypertension   . Hyperlipidemia   . COPD (chronic obstructive pulmonary disease)   . CHF (congestive heart failure) diastolic 10/2010    CXR in 12/2010: Prior CABG, cardiomegaly, vascular redistribution, bibasilar atelectasis, small effusions  . Pneumonia   . Impaired glucose tolerance   . Atrial fibrillation, chronic   . Prostate cancer     elevated PSA  . GERD (gastroesophageal reflux disease)     + hiatal hernia  . Arteriosclerotic cardiovascular disease (ASCVD) 1994    stent to RCA; CABG-2003  . Hard of hearing   . Anemia, iron deficiency   . AAA (abdominal aortic aneurysm)     Fusiform; infrarenal; 4-4.1cm on CT in 07/2008 and 12/2010 by MRI  . Gastric ulcer 2004    2004; upper GI bleed  . Renal cysts, acquired, bilateral     Complex by MRI in 12/2010  . Adenomatous polyps 04/07/2012  . HYPERTENSION 12/17/2007    Subsequently hypotensive and all antihypertensive medication discontinued Lab  03/2012: Mild anemia with H&H-11.8/37.5, MCV-97, ferritin-224, normal CMet ex G-124, alb-3.4   . NSTEMI (non-ST elevated myocardial infarction) 01/18/2011  . Diabetes mellitus, type II 07/13/12    family denies patient is diabetic  . Macrocytosis without anemia 08/13/2012  .  Acute systolic congestive heart failure 08/15/2012    EF 35-40%, per Echo  . Aortic stenosis, moderate 08/15/2012  . Pulmonary hypertension due to COPD 08/15/2012    53 mm Hg  . Tricuspid valve regurgitation 08/15/2012  . Retroperitoneal bleed 06/2012  . On home O2   . Left leg swelling 03/2013  . Clostridium difficile colitis 03/2013   Past Surgical History  Procedure Laterality Date  . Knee surgery      Left  . Bladder surgery    . Femoral artery stent  12-06-10    Left SFA  . Appendectomy    . Mitral valve replacement (mvr)/coronary artery bypass grafting (cabg)  2003    stent to RCA in 1994  . Eye surgery  2007    bilateral cataracts  . Tonsillectomy      thinks they were removed while in the [employer]  . Mass excision  06/07/2011    Procedure: EXCISION MASS;  Surgeon: Fabio Bering;  Location: [facility];  Service: General;  Laterality: N/A;  excision of 2 masses back and buttocks  . Back surgery    . Esophagogastroduodenoscopy  03/03/2003    Large, deep prepyloric ulcer, as described above without bleeding/ Normal esophagus  . Esophagogastroduodenoscopy  09/03/2004    normal throughout  . Colonoscopy  09/03/2004    small ulcer, without stigmata of bleeding  .  Esophagogastroduodenoscopy  11/14/2004    Normal esophagus and small hiatal hernia, otherwise normal stomach   . Colonoscopy  11/14/2004    Internal hemorrhoids, otherwise normal rectum/ left-sided diverticula , diffusely oozing right colon mucosa without a discrete lesion amenable to endoscopic therapy. 2 diminutive polyps. FELT TO HAVE AVMs/telangiectasias  . Colonoscopy  01/29/2005    Normal rectum  . Colonoscopy  03/11/2012    Colonic diverticulosis. Colonic polyps-removed as described above. Vascular anomalies in the cecum likely representing hemangiomas. Status post hemostasis clipping of  2 of the 3. ADENOMATOUS POLYPS. Repeat 2016  . Esophagogastroduodenoscopy  03/11/2012    Deformity of the antrum;  small polyp in  antrum-not manipulated. Otherwise normal exam   Family History  Problem Relation Age of Onset  . Stroke Mother   . Stroke Brother   . Heart attack Brother   . Cancer Brother   . Cancer Brother   . Anesthesia problems Neg Hx   . Hypotension Neg Hx   . Malignant hyperthermia Neg Hx   . Pseudochol deficiency Neg Hx    History  Substance Use Topics  . Smoking status: Former Smoker -- 1.00 packs/day for 70 years    Types: Cigarettes    Quit date: 06/25/2007  . Smokeless tobacco: Former Neurosurgeon  . Alcohol Use: No    Review of Systems ROS: Statement: All systems negative except as marked or noted in the HPI; Constitutional: Negative for fever and chills. ; ; Eyes: Negative for eye pain, redness and discharge. ; ; ENMT: Negative for ear pain, hoarseness, nasal congestion, sinus pressure and sore throat. ; ; Cardiovascular: Negative for chest pain, palpitations, diaphoresis, +dyspnea and peripheral edema. ; ; Respiratory: Negative for cough, wheezing and stridor. ; ; Gastrointestinal: Negative for nausea, vomiting, diarrhea, abdominal pain, blood in stool, hematemesis, jaundice and rectal bleeding. . ; ; Genitourinary: Negative for dysuria, flank pain and hematuria. ; ; Musculoskeletal: Negative for back pain and neck pain. Negative for swelling and trauma.; ; Skin: Negative for pruritus, rash, abrasions, blisters, bruising and skin lesion.; ; Neuro: Negative for headache, lightheadedness and neck stiffness. Negative for weakness, altered level of consciousness , altered mental status, extremity weakness, paresthesias, involuntary movement, seizure and syncope.       Allergies  Aspirin; Gabapentin; and Naproxen  Home Medications   Current Outpatient Rx  Name  Route  Sig  Dispense  Refill  . albuterol (PROVENTIL) (2.5 MG/3ML) 0.083% nebulizer solution   Nebulization   Take 2.5 mg by nebulization every 6 (six) hours as needed for wheezing or shortness of breath.         . cycloSPORINE  (RESTASIS) 0.05 % ophthalmic emulsion   Both Eyes   Place 1 drop into both eyes 2 (two) times daily.         . digoxin (LANOXIN) 0.125 MG tablet   Oral   Take 0.125 mg by mouth daily.         . Fluticasone-Salmeterol (ADVAIR) 100-50 MCG/DOSE AEPB   Inhalation   Inhale 1 puff into the lungs 2 (two) times daily.         . furosemide (LASIX) 20 MG tablet   Oral   Take 60 mg by mouth daily.         Marland Kitchen guaiFENesin (MUCINEX) 600 MG 12 hr tablet   Oral   Take 600 mg by mouth 2 (two) times daily.         Marland Kitchen ipratropium (ATROVENT) 0.02 % nebulizer solution  Nebulization   Take 0.5 mg by nebulization every 6 (six) hours as needed for wheezing or shortness of breath.         . lovastatin (MEVACOR) 40 MG tablet   Oral   Take 40 mg by mouth 4 (four) times a week. Patient takes on Sunday,Monday,Wednesday,Friday         . Multiple Vitamins-Minerals (CENTRUM SILVER) tablet   Oral   Take 1 tablet by mouth daily.         Marland Kitchen omeprazole (PRILOSEC) 40 MG capsule   Oral   Take 40 mg by mouth daily.         . potassium chloride SA (K-DUR,KLOR-CON) 20 MEQ tablet   Oral   Take 20 mEq by mouth 2 (two) times daily.         . tamsulosin (FLOMAX) 0.4 MG CAPS capsule   Oral   Take 0.4 mg by mouth at bedtime.           BP 124/66  Temp(Src) 97.6 F (36.4 C)  SpO2 98% Physical Exam 1710: Physical examination:  Nursing notes reviewed; Vital signs and O2 SAT reviewed;  Constitutional: Well developed, Well nourished, Well hydrated, In no acute distress; Head:  Normocephalic, atraumatic; Eyes: EOMI, PERRL, No scleral icterus; ENMT: Mouth and pharynx normal, Mucous membranes moist; Neck: Supple, Full range of motion, No lymphadenopathy; Cardiovascular: Irregular rate and rhythm, No gallop; Respiratory: Breath sounds coarse & equal bilaterally, faint scattered wheezes.  Speaking full sentences with ease, Normal respiratory effort/excursion; Chest: Nontender, Movement normal; Abdomen:  Soft, Nontender, Nondistended, Normal bowel sounds; Genitourinary: No CVA tenderness; Extremities: Pulses normal, No tenderness, +1 pedal edema L>R with calf asymmetry.; Neuro: AA&Ox3, Major CN grossly intact.  Speech clear. No gross focal motor or sensory deficits in extremities.; Skin: Color normal, Warm, Dry.   ED Course  Procedures   EKG Interpretation     Ventricular Rate:  69 PR Interval:    QRS Duration: 136 QT Interval:  412 QTC Calculation: 441 R Axis:   2 Text Interpretation:  Atrial fibrillation with a competing junctional pacemaker with premature ventricular or aberrantly conducted complexes Non-specific intra-ventricular conduction block Cannot rule out Inferior infarct , age undetermined Cannot rule out Anterior infarct , age undetermined T wave abnormality Lateral leads Abnormal ECG When compared with ECG of 13-Apr-2013 16:57, Atrial fibrillation has replaced Wide QRS rhythm            MDM  MDM Reviewed: previous chart, nursing note and vitals Reviewed previous: labs and ECG Interpretation: labs, ECG and x-ray     Results for orders placed during the hospital encounter of 05/08/13  CBC WITH DIFFERENTIAL      Result Value Range   WBC 7.4  4.0 - 10.5 K/uL   RBC 4.48  4.22 - 5.81 MIL/uL   Hemoglobin 14.2  13.0 - 17.0 g/dL   HCT 09.8  11.9 - 14.7 %   MCV 100.0  78.0 - 100.0 fL   MCH 31.7  26.0 - 34.0 pg   MCHC 31.7  30.0 - 36.0 g/dL   RDW 82.9 (*) 56.2 - 13.0 %   Platelets 155  150 - 400 K/uL   Neutrophils Relative % 73  43 - 77 %   Lymphocytes Relative 6 (*) 12 - 46 %   Monocytes Relative 19 (*) 3 - 12 %   Eosinophils Relative 0  0 - 5 %   Basophils Relative 2 (*) 0 - 1 %   Band Neutrophils  0  0 - 10 %   Metamyelocytes Relative 0     Myelocytes 0     Promyelocytes Absolute 0     Blasts 0     nRBC 0  0 /100 WBC   Neutro Abs 5.5  1.7 - 7.7 K/uL   Lymphs Abs 0.4 (*) 0.7 - 4.0 K/uL   Monocytes Absolute 1.4 (*) 0.1 - 1.0 K/uL   Eosinophils Absolute  0.0  0.0 - 0.7 K/uL   Basophils Absolute 0.1  0.0 - 0.1 K/uL  BASIC METABOLIC PANEL      Result Value Range   Sodium 137  135 - 145 mEq/L   Potassium 4.4  3.5 - 5.1 mEq/L   Chloride 99  96 - 112 mEq/L   CO2 28  19 - 32 mEq/L   Glucose, Bld 100 (*) 70 - 99 mg/dL   BUN 21  6 - 23 mg/dL   Creatinine, Ser 1.47  0.50 - 1.35 mg/dL   Calcium 9.3  8.4 - 82.9 mg/dL   GFR calc non Af Amer 50 (*) >90 mL/min   GFR calc Af Amer 58 (*) >90 mL/min  TROPONIN I      Result Value Range   Troponin I <0.30  <0.30 ng/mL  PRO B NATRIURETIC PEPTIDE      Result Value Range   Pro B Natriuretic peptide (BNP) 7103.0 (*) 0 - 450 pg/mL  DIGOXIN LEVEL      Result Value Range   Digoxin Level 1.6  0.8 - 2.0 ng/mL    US Venous Img Lower Unilateral Left 04/14/2013   CLINICAL DATA:  Edema. History of prostate carcinoma.  EXAM: LEFT LOWER EXTREMITY VENOUS DOPPLER ULTRASOUND  TECHNIQUE: Gray-scale sonography with compression, as well as color and duplex ultrasound, were performed to evaluate the deep venous system from the level of the common femoral vein through the popliteal and proximal calf veins.  COMPARISON:  None  FINDINGS: Normal compressibility of the common femoral, superficial femoral, and popliteal veins, as well as the proximal calf veins. No filling defects to suggest DVT on grayscale or color Doppler imaging. Doppler waveforms show normal direction of venous flow, normal respiratory phasicity and response to augmentation.  IMPRESSION: No evidence of  lower extremity deep vein thrombosis.   Electronically Signed   By: Oley Balm M.D.   On: 04/14/2013 13:36   Dg Chest Portable 1 View 05/08/2013   CLINICAL DATA:  History of MVP with MVR, hypertension, COPD, CHF, atrial fibrillation, prostate cancer, AAA, diabetes, femoral artery stent. Shortness of breath and swelling.  EXAM: PORTABLE CHEST - 1 VIEW  COMPARISON:  04/13/2013  FINDINGS: Patient has had median sternotomy and CABG. The heart is enlarged. There  is pulmonary vascular congestion. Right pleural effusion right basilar opacity is present, consistent with consolidation and/or atelectasis. Left lower lobe atelectasis is present.  IMPRESSION: 1. Cardiomegaly and vascular congestion. 2. Right pleural effusion and right basilar consolidation.   Electronically Signed   By: Rosalie Gums M.D.   On: 05/08/2013 17:27   Results for PERKINS, MOLINA (MRN [id number]) as of 05/08/2013 18:41  Ref. Range 04/02/2013 11:23 04/13/2013 17:28 05/08/2013 17:14  Pro B Natriuretic peptide (BNP) Latest Range: 0-450 pg/mL 4668.0 (H) 6401.0 (H) 7103.0 (H)    1815:  Pt's Sats 85% on his usual O2 3L N/C on arrival to ED. Short neb and IV lasix given; as pt has hx COPD and CHF. Sats now improved to 98% on O2 3L N/C. Family  endorses pt has had LLE swelling for several weeks, and his recent Vasc US (04/14/13) was negative for DVT. Dx and testing d/w pt and family.  Questions answered.  Verb understanding, agreeable to admit. T/C to [facility] Dr. Sherrie Mustache, case discussed, including:  HPI, pertinent PM/SHx, VS/PE, dx testing, ED course and treatment:  Agreeable to admit, requests to write temporary orders, obtain tele bed to team 2.   Laray Anger, DO 05/09/13 1435

## 2013-05-08 NOTE — H&P (Signed)
PCP:   [name], MD   Chief Complaint:  sob  HPI: 79 yo male h/o combined chf, copd, on 3 L oxygen cont at home, cafib, AAA lives with family comes in with several weeks of worsening sob and le edema and bloating.  He denies any fevers.  No problem sleeping at night.  Says he has been gaining wt but dont know how much.  Some nonprod cough.  Denies wheezing.  Feels better with tx in ED with nebs and lasix.   Review of Systems:  Positive and negative as per HPI otherwise all other systems are negative  Past Medical History: Past Medical History  Diagnosis Date  . Mitral valve prolapse 2003    MVR in 2003  . Obesity   . Osteoarthritis   . Hypertension   . Hyperlipidemia   . COPD (chronic obstructive pulmonary disease)   . CHF (congestive heart failure) diastolic 10/2010    CXR in 12/2010: Prior CABG, cardiomegaly, vascular redistribution, bibasilar atelectasis, small effusions  . Pneumonia   . Impaired glucose tolerance   . Atrial fibrillation, chronic   . Prostate cancer     elevated PSA  . GERD (gastroesophageal reflux disease)     + hiatal hernia  . Arteriosclerotic cardiovascular disease (ASCVD) 1994    stent to RCA; CABG-2003  . Hard of hearing   . Anemia, iron deficiency   . AAA (abdominal aortic aneurysm)     Fusiform; infrarenal; 4-4.1cm on CT in 07/2008 and 12/2010 by MRI  . Gastric ulcer 2004    2004; upper GI bleed  . Renal cysts, acquired, bilateral     Complex by MRI in 12/2010  . Adenomatous polyps 04/07/2012  . HYPERTENSION 12/17/2007    Subsequently hypotensive and all antihypertensive medication discontinued Lab  03/2012: Mild anemia with H&H-11.8/37.5, MCV-97, ferritin-224, normal CMet ex G-124, alb-3.4   . NSTEMI (non-ST elevated myocardial infarction) 01/18/2011  . Diabetes mellitus, type II 07/13/12    family denies patient is diabetic  . Macrocytosis without anemia 08/13/2012  . Acute systolic congestive heart failure 08/15/2012    EF 35-40%, per  Echo  . Aortic stenosis, moderate 08/15/2012  . Pulmonary hypertension due to COPD 08/15/2012    53 mm Hg  . Tricuspid valve regurgitation 08/15/2012  . Retroperitoneal bleed 06/2012  . On home O2   . Left leg swelling 03/2013  . Clostridium difficile colitis 03/2013   Past Surgical History  Procedure Laterality Date  . Knee surgery      Left  . Bladder surgery    . Femoral artery stent  12-06-10    Left SFA  . Appendectomy    . Mitral valve replacement (mvr)/coronary artery bypass grafting (cabg)  2003    stent to RCA in 1994  . Eye surgery  2007    bilateral cataracts  . Tonsillectomy      thinks they were removed while in the [employer]  . Mass excision  06/07/2011    Procedure: EXCISION MASS;  Surgeon: Fabio Bering;  Location: [facility];  Service: General;  Laterality: N/A;  excision of 2 masses back and buttocks  . Back surgery    . Esophagogastroduodenoscopy  03/03/2003    Large, deep prepyloric ulcer, as described above without bleeding/ Normal esophagus  . Esophagogastroduodenoscopy  09/03/2004    normal throughout  . Colonoscopy  09/03/2004    small ulcer, without stigmata of bleeding  . Esophagogastroduodenoscopy  11/14/2004    Normal esophagus and small  hiatal hernia, otherwise normal stomach   . Colonoscopy  11/14/2004    Internal hemorrhoids, otherwise normal rectum/ left-sided diverticula , diffusely oozing right colon mucosa without a discrete lesion amenable to endoscopic therapy. 2 diminutive polyps. FELT TO HAVE AVMs/telangiectasias  . Colonoscopy  01/29/2005    Normal rectum  . Colonoscopy  03/11/2012    Colonic diverticulosis. Colonic polyps-removed as described above. Vascular anomalies in the cecum likely representing hemangiomas. Status post hemostasis clipping of  2 of the 3. ADENOMATOUS POLYPS. Repeat 2016  . Esophagogastroduodenoscopy  03/11/2012    Deformity of the antrum;  small polyp in antrum-not manipulated. Otherwise normal exam    Medications: Prior  to Admission medications   Medication Sig Start Date End Date Taking? Authorizing Provider  albuterol (PROVENTIL) (2.5 MG/3ML) 0.083% nebulizer solution Take 2.5 mg by nebulization every 6 (six) hours as needed for wheezing or shortness of breath.   Yes Historical Provider, MD  cycloSPORINE (RESTASIS) 0.05 % ophthalmic emulsion Place 1 drop into both eyes 2 (two) times daily.   Yes Historical Provider, MD  digoxin (LANOXIN) 0.125 MG tablet Take 0.125 mg by mouth daily.   Yes Historical Provider, MD  Fluticasone-Salmeterol (ADVAIR) 100-50 MCG/DOSE AEPB Inhale 1 puff into the lungs 2 (two) times daily.   Yes Historical Provider, MD  furosemide (LASIX) 20 MG tablet Take 60 mg by mouth daily.   Yes Historical Provider, MD  guaiFENesin (MUCINEX) 600 MG 12 hr tablet Take 600 mg by mouth 2 (two) times daily.   Yes Historical Provider, MD  ipratropium (ATROVENT) 0.02 % nebulizer solution Take 0.5 mg by nebulization every 6 (six) hours as needed for wheezing or shortness of breath.   Yes Historical Provider, MD  lovastatin (MEVACOR) 40 MG tablet Take 40 mg by mouth 4 (four) times a week. Patient takes on Sunday,Monday,Wednesday,Friday   Yes Historical Provider, MD  Multiple Vitamins-Minerals (CENTRUM SILVER) tablet Take 1 tablet by mouth daily.   Yes Historical Provider, MD  omeprazole (PRILOSEC) 40 MG capsule Take 40 mg by mouth daily.   Yes Historical Provider, MD  potassium chloride SA (K-DUR,KLOR-CON) 20 MEQ tablet Take 20 mEq by mouth 2 (two) times daily.   Yes Historical Provider, MD  tamsulosin (FLOMAX) 0.4 MG CAPS capsule Take 0.4 mg by mouth at bedtime.    Yes Historical Provider, MD    Allergies:   Allergies  Allergen Reactions  . Aspirin Other (See Comments)    CANNOT TAKE DE TO BLEEDING  . Gabapentin     Patient cannot take over 200 mg dose at a time as it causes jerking or spasms  . Naproxen Other (See Comments)    CANNOT TAKE/NO REASON GIVEN    Social History:  reports that he quit  smoking about 5 years ago. His smoking use included Cigarettes. He has a 70 pack-year smoking history. He has quit using smokeless tobacco. He reports that he does not drink alcohol or use illicit drugs.  Family History: Family History  Problem Relation Age of Onset  . Stroke Mother   . Stroke Brother   . Heart attack Brother   . Cancer Brother   . Cancer Brother   . Anesthesia problems Neg Hx   . Hypotension Neg Hx   . Malignant hyperthermia Neg Hx   . Pseudochol deficiency Neg Hx     Physical Exam: Filed Vitals:   05/08/13 1619 05/08/13 1729 05/08/13 1942  BP: 124/66  113/67  Pulse:   82  Temp: 97.6 F (  36.4 C)    Resp:   22  SpO2: 85% 98% 95%   General appearance: alert, cooperative and no distress Head: Normocephalic, without obvious abnormality, atraumatic Eyes: negative Nose: Nares normal. Septum midline. Mucosa normal. No drainage or sinus tenderness. Neck: no JVD and supple, symmetrical, trachea midline Lungs: diminished breath sounds bilaterally Heart: regular rate and rhythm, S1, S2 normal, no murmur, click, rub or gallop Abdomen: soft, non-tender; bowel sounds normal; no masses,  no organomegaly Extremities: edema 1+ pitting ble Pulses: 2+ and symmetric Skin: Skin color, texture, turgor normal. No rashes or lesions Neurologic: Grossly normal    Labs on Admission:   Recent Labs  05/08/13 1714  NA 137  K 4.4  CL 99  CO2 28  GLUCOSE 100*  BUN 21  CREATININE 1.23  CALCIUM 9.3    Recent Labs  05/08/13 1714  WBC 7.4  NEUTROABS 5.5  HGB 14.2  HCT 44.8  MCV 100.0  PLT 155    Recent Labs  05/08/13 1714  TROPONINI <0.30   Radiological Exams on Admission: Dg Chest Portable 1 View  05/08/2013   CLINICAL DATA:  History of MVP with MVR, hypertension, COPD, CHF, atrial fibrillation, prostate cancer, AAA, diabetes, femoral artery stent. Shortness of breath and swelling.  EXAM: PORTABLE CHEST - 1 VIEW  COMPARISON:  04/13/2013  FINDINGS: Patient has  had median sternotomy and CABG. The heart is enlarged. There is pulmonary vascular congestion. Right pleural effusion right basilar opacity is present, consistent with consolidation and/or atelectasis. Left lower lobe atelectasis is present.  IMPRESSION: 1. Cardiomegaly and vascular congestion. 2. Right pleural effusion and right basilar consolidation.   Electronically Signed   By: Rosalie Gums M.D.   On: 05/08/2013 17:27   Assessment/Plan  79 yo male with acute on c resp failure secondary to acute on chronic combined chf  Principal Problem:   Acute on chronic systolic heart failure- place on iv lasix.  Daily wt.  Accurate i/os.  resp status back to baseline with good o2 sats on his 3 L [place].  Tele monitoring.    Active Problems:   Obesity   Acute-on-chronic respiratory failure   Pulmonary hypertension due to COPD   Tricuspid valve regurgitation    Prescilla Monger A 05/08/2013, 8:01 PM

## 2013-05-08 NOTE — ED Notes (Signed)
Family member states he is swelling all over for the past 2 weeks, states swelling is getting worse

## 2013-05-08 NOTE — ED Notes (Signed)
Pt presents to er with daughter with c/o generalized weakness, sob, fluid retention in lower left leg and abd area. Daughter states that pt was just recently discharged from the hospital for some of the same symptoms. Pt is on home oxygen at 3 lpm via [place].

## 2013-05-09 DIAGNOSIS — I359 Nonrheumatic aortic valve disorder, unspecified: Secondary | ICD-10-CM

## 2013-05-09 DIAGNOSIS — R269 Unspecified abnormalities of gait and mobility: Secondary | ICD-10-CM

## 2013-05-09 DIAGNOSIS — J441 Chronic obstructive pulmonary disease with (acute) exacerbation: Secondary | ICD-10-CM

## 2013-05-09 DIAGNOSIS — K219 Gastro-esophageal reflux disease without esophagitis: Secondary | ICD-10-CM

## 2013-05-09 LAB — BASIC METABOLIC PANEL
BUN: 20 mg/dL (ref 6–23)
CO2: 28 mEq/L (ref 19–32)
Calcium: 8.9 mg/dL (ref 8.4–10.5)
Creatinine, Ser: 1.17 mg/dL (ref 0.50–1.35)
GFR calc Af Amer: 62 mL/min — ABNORMAL LOW (ref >90)
GFR calc non Af Amer: 53 mL/min — ABNORMAL LOW (ref >90)
Glucose, Bld: 69 mg/dL — ABNORMAL LOW (ref 70–99)
Potassium: 4 mEq/L (ref 3.5–5.1)
Sodium: 140 mEq/L (ref 135–145)

## 2013-05-09 LAB — GLUCOSE, CAPILLARY
Glucose-Capillary: 101 mg/dL — ABNORMAL HIGH (ref 70–99)
Glucose-Capillary: 64 mg/dL — ABNORMAL LOW (ref 70–99)
Glucose-Capillary: 83 mg/dL (ref 70–99)
Glucose-Capillary: 94 mg/dL (ref 70–99)

## 2013-05-09 MED ORDER — ALBUTEROL SULFATE (5 MG/ML) 0.5% IN NEBU
2.5000 mg | INHALATION_SOLUTION | Freq: Four times a day (QID) | RESPIRATORY_TRACT | Status: DC
  Administered 2013-05-09 – 2013-05-13 (×14): 2.5 mg via RESPIRATORY_TRACT
  Filled 2013-05-09 (×14): qty 0.5

## 2013-05-09 MED ORDER — MOMETASONE FURO-FORMOTEROL FUM 100-5 MCG/ACT IN AERO
2.0000 | INHALATION_SPRAY | Freq: Two times a day (BID) | RESPIRATORY_TRACT | Status: DC
  Administered 2013-05-09 – 2013-05-13 (×8): 2 via RESPIRATORY_TRACT
  Filled 2013-05-09: qty 8.8

## 2013-05-09 MED ORDER — IPRATROPIUM BROMIDE 0.02 % IN SOLN
0.5000 mg | Freq: Four times a day (QID) | RESPIRATORY_TRACT | Status: DC
  Administered 2013-05-09 – 2013-05-13 (×14): 0.5 mg via RESPIRATORY_TRACT
  Filled 2013-05-09 (×14): qty 2.5

## 2013-05-09 MED ORDER — ALBUTEROL SULFATE (5 MG/ML) 0.5% IN NEBU
2.5000 mg | INHALATION_SOLUTION | RESPIRATORY_TRACT | Status: DC
  Administered 2013-05-09 (×2): 2.5 mg via RESPIRATORY_TRACT
  Filled 2013-05-09 (×2): qty 0.5

## 2013-05-09 MED ORDER — SIMVASTATIN 20 MG PO TABS
20.0000 mg | ORAL_TABLET | Freq: Every day | ORAL | Status: DC
  Administered 2013-05-09 – 2013-05-12 (×4): 20 mg via ORAL
  Filled 2013-05-09 (×4): qty 1

## 2013-05-09 NOTE — Progress Notes (Signed)
[facility] PROGRESS NOTE  [name] RUE:[id number] DOB: [birth year] DOA: 05/08/2013 PCP: [name], MD  Assessment/Plan: 1. Acute on Chronic Systolic Heart Failure - clinically improving with diuresis with IV furosemide. Continue daily weights, fluid restriction, monitoring lytes, repeat CXR in AM.  Pt's most recent ECHO 2/14 reveals EF of 35-40% with diffuse hypokinesis and moderate AS.  Following BNP.  Dig level OK.  2. Chronic Hypoxia - continue supplemental oxygen 3. Hypertension - well controlled on no meds 4. Hypoglycemia - Pt had not been eating well but has improved slightly. Will increase blood glucose tests, hypoglycemic precautions, consider 3 am tests if hypoglycemia recurs.  Follow closely.  5. COPD - continue nebulizer treatments which seem to help symptoms. 6. DM type 2 - see #4  7. Physical Deconditioning, immobility - PT eval.   Code Status: Full  Family Communication: No family at bedside today Disposition Plan: pending clinical course  HPI/Subjective: Pt reports that the swelling in legs is getting better.   Objective: Filed Vitals:   05/09/13 0928  BP:   Pulse: 82  Temp:   Resp:     Intake/Output Summary (Last 24 hours) at 05/09/13 1254 Last data filed at 05/09/13 0900  Gross per 24 hour  Intake    580 ml  Output    950 ml  Net   -370 ml   Filed Weights   05/08/13 2046 05/09/13 0347  Weight: 243 lb 9.7 oz (110.5 kg) 245 lb 13 oz (111.5 kg)    Exam:  General appearance: elderly chronically ill appearing male, alert, cooperative and no distress  Head: Normocephalic, without obvious abnormality, atraumatic  Eyes: anicteric Nose: Nares normal. Septum midline. Mucosa normal.   Neck: no JVD and supple, symmetrical, trachea midline  Lungs: shallow breath sounds bilaterally with bibasilar wheezing  Heart: normal S1, S2 sounds Abdomen: soft, non-tender; bowel sounds normal; no masses, no organomegaly  Extremities: no pretibial edema   Pulses: 2+ and symmetric  Skin: Skin color, texture, turgor normal. No rashes or lesions  Neurologic: moving all extremities  Data Reviewed: Basic Metabolic Panel:  Recent Labs Lab 05/08/13 1714 05/09/13 0555  NA 137 140  K 4.4 4.0  CL 99 102  CO2 28 28  GLUCOSE 100* 69*  BUN 21 20  CREATININE 1.23 1.17  CALCIUM 9.3 8.9   Liver Function Tests: No results found for this basename: AST, ALT, ALKPHOS, BILITOT, PROT, ALBUMIN,  in the last 168 hours No results found for this basename: LIPASE, AMYLASE,  in the last 168 hours No results found for this basename: AMMONIA,  in the last 168 hours CBC:  Recent Labs Lab 05/08/13 1714  WBC 7.4  NEUTROABS 5.5  HGB 14.2  HCT 44.8  MCV 100.0  PLT 155   Cardiac Enzymes:  Recent Labs Lab 05/08/13 1714  TROPONINI <0.30   BNP (last 3 results)  Recent Labs  04/02/13 1123 04/13/13 1728 05/08/13 1714  PROBNP 4668.0* 6401.0* 7103.0*   CBG:  Recent Labs Lab 05/09/13 0757 05/09/13 0823 05/09/13 0946  GLUCAP 64* 83 101*    No results found for this or any previous visit (from the past 240 hour(s)).   Studies: Dg Chest Portable 1 View  05/08/2013   CLINICAL DATA:  History of MVP with MVR, hypertension, COPD, CHF, atrial fibrillation, prostate cancer, AAA, diabetes, femoral artery stent. Shortness of breath and swelling.  EXAM: PORTABLE CHEST - 1 VIEW  COMPARISON:  04/13/2013  FINDINGS: Patient has had median sternotomy and  CABG. The heart is enlarged. There is pulmonary vascular congestion. Right pleural effusion right basilar opacity is present, consistent with consolidation and/or atelectasis. Left lower lobe atelectasis is present.  IMPRESSION: 1. Cardiomegaly and vascular congestion. 2. Right pleural effusion and right basilar consolidation.   Electronically Signed   By: Rosalie Gums M.D.   On: 05/08/2013 17:27    Scheduled Meds: . albuterol  2.5 mg Nebulization Q4H WA  . cycloSPORINE  1 drop Both Eyes BID  . digoxin   0.125 mg Oral Daily  . enoxaparin (LOVENOX) injection  40 mg Subcutaneous Q24H  . furosemide  40 mg Intravenous Q12H  . guaiFENesin  600 mg Oral BID  . pantoprazole  40 mg Oral Daily  . potassium chloride SA  20 mEq Oral BID  . sodium chloride  3 mL Intravenous Q12H  . tamsulosin  0.4 mg Oral QHS   Continuous Infusions:   Principal Problem:   Acute on chronic systolic heart failure Active Problems:   Obesity   Acute-on-chronic respiratory failure   Pulmonary hypertension due to COPD   Tricuspid valve regurgitation   [name] [facility]  [facility] Pager [phone]. If 7PM-7AM, please contact night-coverage at [email or web address], password [facility] 05/09/2013, 12:54 PM  LOS: 1 day

## 2013-05-09 NOTE — Plan of Care (Signed)
Tele paged me about pt in [place] t[facility] - pt was having BM, but otherwise asymptomatic.

## 2013-05-10 ENCOUNTER — Ambulatory Visit: Admitting: Family Medicine

## 2013-05-10 ENCOUNTER — Inpatient Hospital Stay (HOSPITAL_COMMUNITY): Payer: Medicare Other

## 2013-05-10 DIAGNOSIS — E162 Hypoglycemia, unspecified: Secondary | ICD-10-CM

## 2013-05-10 DIAGNOSIS — E669 Obesity, unspecified: Secondary | ICD-10-CM

## 2013-05-10 DIAGNOSIS — I5023 Acute on chronic systolic (congestive) heart failure: Principal | ICD-10-CM

## 2013-05-10 LAB — GLUCOSE, CAPILLARY
Glucose-Capillary: 85 mg/dL (ref 70–99)
Glucose-Capillary: 93 mg/dL (ref 70–99)
Glucose-Capillary: 106 mg/dL — ABNORMAL HIGH (ref 70–99)

## 2013-05-10 LAB — CBC
Hemoglobin: 13.3 g/dL (ref 13.0–17.0)
MCH: 31.8 pg (ref 26.0–34.0)
MCHC: 31.7 g/dL (ref 30.0–36.0)
MCV: 100.5 fL — ABNORMAL HIGH (ref 78.0–100.0)
Platelets: 130 10*3/uL — ABNORMAL LOW (ref 150–400)
RBC: 4.18 MIL/uL — ABNORMAL LOW (ref 4.22–5.81)
RDW: 16.6 % — ABNORMAL HIGH (ref 11.5–15.5)

## 2013-05-10 LAB — BASIC METABOLIC PANEL
BUN: 19 mg/dL (ref 6–23)
Creatinine, Ser: 1.21 mg/dL (ref 0.50–1.35)
GFR calc non Af Amer: 51 mL/min — ABNORMAL LOW (ref >90)
Glucose, Bld: 92 mg/dL (ref 70–99)
Potassium: 4.1 mEq/L (ref 3.5–5.1)

## 2013-05-10 LAB — LIPID PANEL
Cholesterol: 125 mg/dL (ref 0–200)
Total CHOL/HDL Ratio: 2.1 RATIO
VLDL: 15 mg/dL (ref 0–40)

## 2013-05-10 MED ORDER — LISINOPRIL 5 MG PO TABS
2.5000 mg | ORAL_TABLET | Freq: Every day | ORAL | Status: DC
  Administered 2013-05-10 – 2013-05-13 (×4): 2.5 mg via ORAL
  Filled 2013-05-10 (×4): qty 1

## 2013-05-10 NOTE — Clinical Social Work Psychosocial (Signed)
Clinical Social Work Department BRIEF PSYCHOSOCIAL ASSESSMENT 05/10/2013  Patient:  [name], [name]     Account Number:  [id number]     Admit date:  05/08/2013  Clinical Social Worker:  Nancie Neas  Date/Time:  05/10/2013 02:30 PM  Referred by:  Physician  Date Referred:  05/10/2013 Referred for  SNF Placement   Other Referral:   Interview type:  Family Other interview type:   [name]- daughter    PSYCHOSOCIAL DATA Living Status:  FAMILY Admitted from facility:   Level of care:   Primary support name:  [name] Primary support relationship to patient:  CHILD, ADULT Degree of support available:   supportive    CURRENT CONCERNS Current Concerns  Post-Acute Placement   Other Concerns:    SOCIAL WORK ASSESSMENT / PLAN CSW met with pt's daughter [name] at bedside as pt sleeping. Pt is well known to CSW from previous admissions. He has been to [facility] several times in the past. [name] has lived with pt for 11 years and is with pt almost around the clock. He is fairly independent with ADLs, but uses a rolling walker for stability. [name] reports pt sleeps most of the day and has difficulty sleeping at night, often calling out for her. Pt had worsening edema and SOB, so [name] brought him to hospital. He was evaluated by PT this morning and recommendation is for SNF. Per staff, pt was agreeable to this earlier. CSW discussed placement process and [name] is aware of copays. CSW will initiate bed search in [place] only at her request.   Assessment/plan status:  Psychosocial Support/Ongoing Assessment of Needs Other assessment/ plan:   Information/referral to community resources:   SNF list    PATIENT'S/FAMILY'S RESPONSE TO PLAN OF CARE: Pt unable to discuss plan of care at this time. Family request CSW to initiate bed search and CSW will attempt to speak with pt about placement tomorrow. She is willing to take him back home at any point if he refuses.       [name], [place] [phone]

## 2013-05-10 NOTE — Clinical Social Work Placement (Signed)
Clinical Social Work Department CLINICAL SOCIAL WORK PLACEMENT NOTE 05/10/2013  Patient:  [name], [name]  Account Number:  [id number] Admit date:  05/08/2013  Clinical Social Worker:  [name], LCSW  Date/time:  05/10/2013 02:27 PM  Clinical Social Work is seeking post-discharge placement for this patient at the following level of care:   SKILLED NURSING   (*CSW will update this form in Epic as items are completed)   05/10/2013  Patient/family provided with [facility] Department of Clinical Social Work's list of facilities offering this level of care within the geographic area requested by the patient (or if unable, by the patient's family).  05/10/2013  Patient/family informed of their freedom to choose among providers that offer the needed level of care, that participate in Medicare, Medicaid or managed care program needed by the patient, have an available bed and are willing to accept the patient.  05/10/2013  Patient/family informed of [facility]' ownership interest in [facility], as well as of the fact that they are under no obligation to receive care at this facility.  PASARR submitted to EDS on  PASARR number received from EDS on   FL2 transmitted to all facilities in geographic area requested by pt/family on  05/10/2013 FL2 transmitted to all facilities within larger geographic area on   Patient informed that his/her managed care company has contracts with or will negotiate with  certain facilities, including the following:     Patient/family informed of bed offers received:   Patient chooses bed at  Physician recommends and patient chooses bed at    Patient to be transferred to  on   Patient to be transferred to facility by   The following physician request were entered in Epic:   Additional Comments: Pt has existing pasarr number.   [name], [place] [phone]

## 2013-05-10 NOTE — Evaluation (Signed)
Physical Therapy Evaluation Patient Details Name: [name] MRN: [id number] DOB: 04-Jan-1922 Today's Date: 05/10/2013 Time: 1324-4010 PT Time Calculation (min): 44 min  PT Assessment / Plan / Recommendation History of Present Illness  79 yo male h/o combined chf, copd, on 3 L oxygen cont at home, cafib, AAA lives with family comes in with several weeks of worsening sob and le edema and bloating.  He denies any fevers.  No problem sleeping at night.  Says he has been gaining wt but dont know how much.  Some nonprod cough.  Denies wheezing.  Feels better with tx in ED with nebs and lasix.  Spoke with daugther, who reports she has been trying to motivate her father to get up to decrease risk of continued fluid intake.  She reports that he has had HHPT before and the therapost would make him so sore that he would not even move to go to the bathroom.   Clinical Impression  [name] is referred to PT for general mobility.  At this time requires max-mod A for most of his mobility and is able to maintain O2 sats 88-95% during all mobility (including exercises).  At this time spoke with patient and his daugther (via phone) about d/c planning to SNF (preferrably [facility]) to regain strength to his legs.  He is agreeable to this in order to decrease burden of care and improve independence.     PT Assessment  Patient needs continued PT services    Follow Up Recommendations  SNF    Does the patient have the potential to tolerate intense rehabilitation      Barriers to Discharge        Equipment Recommendations  None recommended by PT    Recommendations for Other Services     Frequency Min 3X/week    Precautions / Restrictions     Pertinent Vitals/Pain Denies pain.  O2 sats w/[place] 88-95%      Mobility  Transfers Transfers: Sit to Stand;Stand to Sit Sit to Stand: 3: Mod assist;From elevated surface;From bed;From chair/3-in-1;2: Max assist Stand to Sit: 3: Mod assist;To  chair/3-in-1 Details for Transfer Assistance: completed 2x Ambulation/Gait Ambulation/Gait Assistance: 3: Mod assist Ambulation Distance (Feet): 3 Feet Assistive device: Rolling walker Gait Pattern: Shuffle    Exercises General Exercises - Lower Extremity Ankle Circles/Pumps: AROM;Both;10 reps;Supine Heel Slides: AAROM;Both;10 reps;Supine Hip ABduction/ADduction: AAROM;Both;10 reps;Supine   PT Diagnosis: Generalized weakness;Difficulty walking  PT Problem List: Decreased strength;Decreased activity tolerance;Cardiopulmonary status limiting activity PT Treatment Interventions: Gait training;Stair training;Functional mobility training;Therapeutic activities;Therapeutic exercise;Neuromuscular re-education;Patient/family education     PT Goals(Current goals can be found in the care plan section) Acute Rehab PT Goals Patient Stated Goal: Pt does not state goal.  He feels PNC would be fine.  PT Goal Formulation: With patient Time For Goal Achievement: 05/17/13 Potential to Achieve Goals: Good  Visit Information  Last PT Received On: 05/10/13 History of Present Illness: 79 yo male h/o combined chf, copd, on 3 L oxygen cont at home, cafib, AAA lives with family comes in with several weeks of worsening sob and le edema and bloating.  He denies any fevers.  No problem sleeping at night.  Says he has been gaining wt but dont know how much.  Some nonprod cough.  Denies wheezing.  Feels better with tx in ED with nebs and lasix.  Spoke with daugther, who reports she has been trying to motivate her father to get up to decrease risk of continued fluid intake.  She  reports that he has had HHPT before and the therapost would make him so sore that he would not even move to go to the bathroom.        Prior Functioning  Home Living Family/patient expects to be discharged to:: Private residence Living Arrangements: Spouse/significant other;Children Available Help at Discharge: Family;Available 24  hours/day Type of Home: Mobile home Home Access: Level entry Home Layout: One level Home Equipment: Walker - 4 wheels;Shower seat;Wheelchair - power;Bedside commode;Hospital bed (lift chair) Prior Function Level of Independence: Independent with assistive device(s) Communication Communication: HOH Dominant Hand: Right    Cognition       Extremity/Trunk Assessment Lower Extremity Assessment Lower Extremity Assessment: Generalized weakness Cervical / Trunk Assessment Cervical / Trunk Assessment: Kyphotic   Balance    End of Session PT - End of Session Equipment Utilized During Treatment: Gait belt Activity Tolerance: Patient limited by fatigue;Patient limited by lethargy Patient left: in chair;with call bell/phone within reach;with chair alarm set  GP     [name], MPT, ATC 05/10/2013, 12:17 PM

## 2013-05-10 NOTE — Progress Notes (Signed)
[facility] PROGRESS NOTE  [name] YQM:[id number] DOB: 11-May-1922 DOA: 05/08/2013 PCP: [name], MD    Code Status: Full code Family Communication: Family not available Disposition Plan: Probable discharge to skilled nursing facility.   Consultants:  None  Procedures:  None  Antibiotics:  None  HPI/Subjective: The patient is lying in bed. He says he's not sure if he is feeling better. He denies chest pain, shortness of breath, or abdominal pain at rest.  Objective: Filed Vitals:   05/10/13 1447  BP: 123/67  Pulse: 70  Temp: 97.6 F (36.4 C)  Resp: 20   oxygen saturation 94% on supplemental oxygen.    Intake/Output Summary (Last 24 hours) at 05/10/13 1605 Last data filed at 05/10/13 1500  Gross per 24 hour  Intake    960 ml  Output   1300 ml  Net   -340 ml   Filed Weights   05/08/13 2046 05/09/13 0347 05/10/13 0355  Weight: 110.5 kg (243 lb 9.7 oz) 111.5 kg (245 lb 13 oz) 110.8 kg (244 lb 4.3 oz)    Exam:   General:  Morbid obese 79 year old Caucasian man laying in bed, in no acute distress.  Cardiovascular: S1, S2, with a 2-3/6 systolic murmur.  Respiratory: Clear anteriorly with fine crackles in the bases.  Abdomen: Obese, positive bowel sounds, soft, nontender, nondistended.  Musculoskeletal/extremities: Trace of pedal edema.  Neurologic: He is hard of hearing, but he is alert and oriented to himself and hospital.   Data Reviewed: Basic Metabolic Panel:  Recent Labs Lab 05/08/13 1714 05/09/13 0555 05/10/13 0630  NA 137 140 141  K 4.4 4.0 4.1  CL 99 102 103  CO2 28 28 30   GLUCOSE 100* 69* 92  BUN 21 20 19   CREATININE 1.23 1.17 1.21  CALCIUM 9.3 8.9 8.7   Liver Function Tests: No results found for this basename: AST, ALT, ALKPHOS, BILITOT, PROT, ALBUMIN,  in the last 168 hours No results found for this basename: LIPASE, AMYLASE,  in the last 168 hours No results found for this basename: AMMONIA,  in the last  168 hours CBC:  Recent Labs Lab 05/08/13 1714 05/10/13 0630  WBC 7.4 4.5  NEUTROABS 5.5  --   HGB 14.2 13.3  HCT 44.8 42.0  MCV 100.0 100.5*  PLT 155 130*   Cardiac Enzymes:  Recent Labs Lab 05/08/13 1714  TROPONINI <0.30   BNP (last 3 results)  Recent Labs  04/13/13 1728 05/08/13 1714 05/10/13 0630  PROBNP 6401.0* 7103.0* 7291.0*   CBG:  Recent Labs Lab 05/09/13 0823 05/09/13 0946 05/09/13 2346 05/10/13 0727 05/10/13 1157  GLUCAP 83 101* 94 85 93    No results found for this or any previous visit (from the past 240 hour(s)).   Studies: Dg Chest Port 1 View  05/10/2013   CLINICAL DATA:  CHF, COPD, shortness of breath, hypertension, follow-up  EXAM: PORTABLE CHEST - 1 VIEW  COMPARISON:  Portable exam 0720 hr compared to 05/08/2013  FINDINGS: Enlargement of cardiac silhouette post median sternotomy.  Pulmonary vascular congestion.  Probable layered right pleural effusion.  Minimal bibasilar atelectasis.  No gross pulmonary edema or segmental consolidation.  No pneumothorax.  Bones demineralized.  IMPRESSION: Probable layered right pleural effusion.  Enlargement of cardiac silhouette with pulmonary vascular congestion post median sternotomy.  Minimal bibasilar atelectasis.   Electronically Signed   By: Ulyses Southward M.D.   On: 05/10/2013 07:51   Dg Chest Portable 1 View  05/08/2013   CLINICAL  DATA:  History of MVP with MVR, hypertension, COPD, CHF, atrial fibrillation, prostate cancer, AAA, diabetes, femoral artery stent. Shortness of breath and swelling.  EXAM: PORTABLE CHEST - 1 VIEW  COMPARISON:  04/13/2013  FINDINGS: Patient has had median sternotomy and CABG. The heart is enlarged. There is pulmonary vascular congestion. Right pleural effusion right basilar opacity is present, consistent with consolidation and/or atelectasis. Left lower lobe atelectasis is present.  IMPRESSION: 1. Cardiomegaly and vascular congestion. 2. Right pleural effusion and right basilar  consolidation.   Electronically Signed   By: Rosalie Gums M.D.   On: 05/08/2013 17:27    Scheduled Meds: . ipratropium  0.5 mg Nebulization Q6H   And  . albuterol  2.5 mg Nebulization Q6H  . cycloSPORINE  1 drop Both Eyes BID  . digoxin  0.125 mg Oral Daily  . enoxaparin (LOVENOX) injection  40 mg Subcutaneous Q24H  . furosemide  40 mg Intravenous Q12H  . guaiFENesin  600 mg Oral BID  . mometasone-formoterol  2 puff Inhalation BID  . pantoprazole  40 mg Oral Daily  . potassium chloride SA  20 mEq Oral BID  . simvastatin  20 mg Oral q1800  . sodium chloride  3 mL Intravenous Q12H  . tamsulosin  0.4 mg Oral QHS   Continuous Infusions:   Assessment and plan:  Principal Problem:   Acute on chronic systolic heart failure Active Problems:   Acute-on-chronic respiratory failure   Chronic combined systolic and diastolic CHF (congestive heart failure)   Pulmonary hypertension due to COPD   Tricuspid valve regurgitation   Hypoglycemia   IMPAIRED GLUCOSE TOLERANCE   Obesity   The patient appears to be comfortable. His urine output apparently has not been recorded. We'll continue Lasix IV every 12 hours. We'll ask the nursing staff to record urine output more accurately. I am not sure why he is not on an ACE inhibitor given his ejection fraction of 35-40%. There was no documented allergy or intolerance or hyperkalemia. Will, therefore at a small dose of lisinopril. Will check his capillary blood glucose 3 times daily to monitor for impaired glucose intolerance and/or hypoglycemia. We'll continue albuterol/Atrovent nebulizers for COPD and chronic oxygen for oxygen-dependent COPD. We'll order TSH and vitamin B12 level for assessment of thrombocytopenia and acute heart failure. Continue physical therapy for deconditioning. He will need skilled nursing facility placement per the recommendation of the physical therapist.   Time spent: 30 minutes.    [facility]  [facility] Pager  [phone]. If 7PM-7AM, please contact night-coverage at [email or web address], password [facility] 05/10/2013, 4:05 PM  LOS: 2 days

## 2013-05-10 NOTE — Care Management Note (Addendum)
    Page 1 of 1   05/13/2013     3:24:17 PM   CARE MANAGEMENT NOTE 05/13/2013  Patient:  [name], [name]   Account Number:  [id number]  Date Initiated:  05/10/2013  Documentation initiated by:  Rosemary Holms  Subjective/Objective Assessment:   Pt admitted from home where he lives with daughter. After last admission, pt was DC with hospital bed. Daughter states he has not been getting up and became SOB. Would now consider SNF for Short term rehab.     Action/Plan:   Anticipated DC Date:  05/11/2013   Anticipated DC Plan:  SKILLED NURSING FACILITY  In-house referral  Clinical Social Worker      DC Planning Services  CM consult      Choice offered to / List presented to:             [facility] agency  HOSPICE   Status of service:  Completed, signed off Medicare Important Message given?  YES (If response is "NO", the following Medicare IM given date fields will be blank) Date Medicare IM given:  05/13/2013 Date Additional Medicare IM given:    Discharge Disposition:  HOME W HOSPICE CARE  Per UR Regulation:    If discussed at Long Length of Stay Meetings, dates discussed:   05/13/2013    Comments:  05/10/13 Rosemary Holms RN BSN CM

## 2013-05-10 NOTE — Progress Notes (Signed)
UR Chart Review Completed  

## 2013-05-11 ENCOUNTER — Encounter (HOSPITAL_COMMUNITY): Payer: Self-pay | Admitting: Internal Medicine

## 2013-05-11 DIAGNOSIS — E039 Hypothyroidism, unspecified: Secondary | ICD-10-CM

## 2013-05-11 HISTORY — DX: Hypothyroidism, unspecified: E03.9

## 2013-05-11 LAB — GLUCOSE, CAPILLARY
Glucose-Capillary: 73 mg/dL (ref 70–99)
Glucose-Capillary: 84 mg/dL (ref 70–99)
Glucose-Capillary: 108 mg/dL — ABNORMAL HIGH (ref 70–99)
Glucose-Capillary: 95 mg/dL (ref 70–99)

## 2013-05-11 LAB — CBC
MCH: 31.7 pg (ref 26.0–34.0)
MCHC: 31 g/dL (ref 30.0–36.0)
Platelets: 133 10*3/uL — ABNORMAL LOW (ref 150–400)
RDW: 16.6 % — ABNORMAL HIGH (ref 11.5–15.5)

## 2013-05-11 LAB — BASIC METABOLIC PANEL
BUN: 18 mg/dL (ref 6–23)
CO2: 34 mEq/L — ABNORMAL HIGH (ref 19–32)
Calcium: 8.8 mg/dL (ref 8.4–10.5)
Creatinine, Ser: 1.27 mg/dL (ref 0.50–1.35)
GFR calc non Af Amer: 48 mL/min — ABNORMAL LOW (ref >90)
Glucose, Bld: 88 mg/dL (ref 70–99)

## 2013-05-11 LAB — VITAMIN B12: Vitamin B-12: 865 pg/mL (ref 211–911)

## 2013-05-11 MED ORDER — LEVOTHYROXINE SODIUM 50 MCG PO TABS
50.0000 ug | ORAL_TABLET | Freq: Every day | ORAL | Status: DC
  Administered 2013-05-11 – 2013-05-13 (×3): 50 ug via ORAL
  Filled 2013-05-11 (×3): qty 1

## 2013-05-11 NOTE — Progress Notes (Addendum)
Physical Therapy Treatment Patient Details Name: [name] MRN: [id number] DOB: Jun 27, 1921 Today's Date: 05/11/2013 Time: 4098-1191 PT Time Calculation (min): 34 min  PT Assessment / Plan / Recommendation  History of Present Illness     PT Comments   Patient tolerated therapeutic exercises well in supine and seated position. continuro required skilled services to increase stability for transfers and gait..  Follow Up Recommendations        Does the patient have the potential to tolerate intense rehabilitation     Barriers to Discharge        Equipment Recommendations       Recommendations for Other Services    Frequency     Progress towards PT Goals Progress towards PT goals: Progressing toward goals  Plan Current plan remains appropriate    Precautions / Restrictions     Pertinent Vitals/Pain     Mobility  Bed Mobility Bed Mobility: Supine to Sit Supine to Sit: 3: Mod assist;HOB elevated;With rails Transfers Sit to Stand: From elevated surface;3: Mod assist;With upper extremity assist Stand to Sit: 3: Mod assist Details for Transfer Assistance: patient not able to stand up from normal sitting height. Ambulation/Gait Ambulation/Gait Assistance: 4: Min assist Ambulation Distance (Feet): 24 Feet Assistive device: Rolling walker Gait Pattern: Shuffle;Trunk flexed;Wide base of support Stairs: No Wheelchair Mobility Wheelchair Mobility: No    Exercises General Exercises - Lower Extremity Short Arc Quad: AROM;Both;10 reps;Supine Long Arc Quad: AROM;Both;10 reps;Supine Heel Slides: AROM;Both;10 reps;Supine Hip ABduction/ADduction: AROM;Strengthening;Both;10 reps;Seated Straight Leg Raises: AROM;Strengthening;Both;10 reps;Supine   PT Diagnosis:    PT Problem List:   PT Treatment Interventions:     PT Goals (current goals can now be found in the care plan section)    Visit Information  Last PT Received On: 05/11/13    Subjective Data       Cognition       Balance     End of Session PT - End of Session Equipment Utilized During Treatment: Gait belt Activity Tolerance: Patient tolerated treatment well Patient left: in chair;with call bell/phone within reach;with chair alarm set   GP     Konrad Penta 05/11/2013, 2:59 PM

## 2013-05-11 NOTE — Progress Notes (Signed)
[facility] PROGRESS NOTE  [name] AVW:[id number] DOB: 08/29/1921 DOA: 05/08/2013 PCP: [name], MD    Code Status: Full code Family Communication: Discussed with daughter, Mrs. Andrey Campanile. Disposition Plan: Probable discharge to skilled nursing facility.   Consultants:  None  Procedures:  None  Antibiotics:  None  HPI/Subjective: The patient is lying in bed. He denies shortness of breath at rest. He denies chest pain.  Objective: Filed Vitals:   05/11/13 1300  BP: 95/50  Pulse: 80  Temp: 97.2 F (36.2 C)  Resp: 19   oxygen saturation 95% on supplemental oxygen.    Intake/Output Summary (Last 24 hours) at 05/11/13 1520 Last data filed at 05/11/13 1048  Gross per 24 hour  Intake    240 ml  Output    801 ml  Net   -561 ml   Filed Weights   05/08/13 2046 05/09/13 0347 05/10/13 0355  Weight: 110.5 kg (243 lb 9.7 oz) 111.5 kg (245 lb 13 oz) 110.8 kg (244 lb 4.3 oz)    Exam:   General:  Morbidly obese 79 year old Caucasian man laying in bed, in no acute distress.  Cardiovascular: S1, S2, with a 2-3/6 systolic murmur.  Respiratory: Clear anteriorly with fine crackles in the bases.  Abdomen: Obese, positive bowel sounds, soft, nontender, nondistended.  Musculoskeletal/extremities: Trace of pedal edema.  Neurologic: He is hard of hearing, but he is alert and oriented to himself and hospital.   Data Reviewed: Basic Metabolic Panel:  Recent Labs Lab 05/08/13 1714 05/09/13 0555 05/10/13 0630 05/11/13 0553  NA 137 140 141 142  K 4.4 4.0 4.1 4.0  CL 99 102 103 102  CO2 28 28 30  34*  GLUCOSE 100* 69* 92 88  BUN 21 20 19 18   CREATININE 1.23 1.17 1.21 1.27  CALCIUM 9.3 8.9 8.7 8.8   Liver Function Tests: No results found for this basename: AST, ALT, ALKPHOS, BILITOT, PROT, ALBUMIN,  in the last 168 hours No results found for this basename: LIPASE, AMYLASE,  in the last 168 hours No results found for this basename: AMMONIA,  in  the last 168 hours CBC:  Recent Labs Lab 05/08/13 1714 05/10/13 0630 05/11/13 0553  WBC 7.4 4.5 4.7  NEUTROABS 5.5  --   --   HGB 14.2 13.3 13.0  HCT 44.8 42.0 42.0  MCV 100.0 100.5* 102.4*  PLT 155 130* 133*   Cardiac Enzymes:  Recent Labs Lab 05/08/13 1714  TROPONINI <0.30   BNP (last 3 results)  Recent Labs  05/08/13 1714 05/10/13 0630 05/11/13 0553  PROBNP 7103.0* 7291.0* 7912.0*   CBG:  Recent Labs Lab 05/10/13 1620 05/10/13 2141 05/11/13 0614 05/11/13 0734 05/11/13 1124  GLUCAP 106* 94 85 73 84    No results found for this or any previous visit (from the past 240 hour(s)).   Studies: Dg Chest Port 1 View  05/10/2013   CLINICAL DATA:  CHF, COPD, shortness of breath, hypertension, follow-up  EXAM: PORTABLE CHEST - 1 VIEW  COMPARISON:  Portable exam 0720 hr compared to 05/08/2013  FINDINGS: Enlargement of cardiac silhouette post median sternotomy.  Pulmonary vascular congestion.  Probable layered right pleural effusion.  Minimal bibasilar atelectasis.  No gross pulmonary edema or segmental consolidation.  No pneumothorax.  Bones demineralized.  IMPRESSION: Probable layered right pleural effusion.  Enlargement of cardiac silhouette with pulmonary vascular congestion post median sternotomy.  Minimal bibasilar atelectasis.   Electronically Signed   By: Ulyses Southward M.D.   On: 05/10/2013 07:51  Scheduled Meds: . ipratropium  0.5 mg Nebulization Q6H   And  . albuterol  2.5 mg Nebulization Q6H  . cycloSPORINE  1 drop Both Eyes BID  . digoxin  0.125 mg Oral Daily  . enoxaparin (LOVENOX) injection  40 mg Subcutaneous Q24H  . furosemide  40 mg Intravenous Q12H  . guaiFENesin  600 mg Oral BID  . lisinopril  2.5 mg Oral Daily  . mometasone-formoterol  2 puff Inhalation BID  . pantoprazole  40 mg Oral Daily  . potassium chloride SA  20 mEq Oral BID  . simvastatin  20 mg Oral q1800  . sodium chloride  3 mL Intravenous Q12H  . tamsulosin  0.4 mg Oral QHS    Continuous Infusions:   Assessment and plan:  Principal Problem:   Acute on chronic systolic heart failure Active Problems:   Acute-on-chronic respiratory failure   Chronic combined systolic and diastolic CHF (congestive heart failure)   Pulmonary hypertension due to COPD   Tricuspid valve regurgitation   Hypoglycemia   IMPAIRED GLUCOSE TOLERANCE   Obesity   1. Acute on chronic combined systolic and diastolic heart failure. The patient appears to be comfortable. The accuracy of the measurement of his in's and outs is suspect. However, he appears to be diuresing and is less crackly on pulmonary exam and he has less peripheral edema. We'll continue Lasix IV every 12 hours. We'll watch his renal function and his carbon dioxide on the metabolic panel to avoid volume contraction. His pro BNP has not improved very much. Small dose of lisinopril was added for his ejection fraction of 35-40% per echocardiogram 07/2012. His digoxin level was okay on admission. His fasting lipid profile is within normal limits. Ordered a TSH and found it to be quite elevated at 33.4. We'll start Synthroid.  2. Newly diagnosed hypothyroidism. We'll start Synthroid. We'll check a free T4.  3. Chronic hypoxic respiratory failure secondary to oxygen-dependent COPD. Currently stable on bronchodilators.  4. History of glucose intolerance with borderline hypoglycemia. CBGs are currently within normal limits, but generally under 100. No further hypoglycemic episodes.   5. Mild thrombocytopenia and macrocytosis.. hypothyroidism may be the etiology. His vitamin B12 level is within normal limits.  6. Deconditioning. The physical therapist has evaluated him and recommended skilled nursing facility placement. The patient's family is in agreement.    Plan: 1. Will start Synthroid. 2. Possible discharge to skilled nursing facility tomorrow.  Time spent: 30 minutes.    [facility]  [facility] Pager  [phone]. If 7PM-7AM, please contact night-coverage at [email or web address], password [facility] 05/11/2013, 3:20 PM  LOS: 3 days

## 2013-05-12 ENCOUNTER — Inpatient Hospital Stay (HOSPITAL_COMMUNITY): Payer: Medicare Other

## 2013-05-12 DIAGNOSIS — I5043 Acute on chronic combined systolic (congestive) and diastolic (congestive) heart failure: Secondary | ICD-10-CM

## 2013-05-12 MED ORDER — IOHEXOL 300 MG/ML  SOLN
50.0000 mL | Freq: Once | INTRAMUSCULAR | Status: AC | PRN
  Administered 2013-05-12: 50 mL via ORAL

## 2013-05-12 NOTE — Clinical Social Work Placement (Signed)
    Clinical Social Work Department CLINICAL SOCIAL WORK PLACEMENT NOTE 05/12/2013  Patient:  ADRAIN, NESBIT  Account Number:  [id number] Admit date:  05/08/2013  Clinical Social Worker:  Sherrlyn Hock  Date/time:  05/10/2013 02:27 PM  Clinical Social Work is seeking post-discharge placement for this patient at the following level of care:   SKILLED NURSING   (*CSW will update this form in Epic as items are completed)   05/10/2013  Patient/family provided with [facility] Department of Clinical Social Work's list of facilities offering this level of care within the geographic area requested by the patient (or if unable, by the patient's family).  05/10/2013  Patient/family informed of their freedom to choose among providers that offer the needed level of care, that participate in Medicare, Medicaid or managed care program needed by the patient, have an available bed and are willing to accept the patient.  05/10/2013  Patient/family informed of [facility]' ownership interest in [facility], as well as of the fact that they are under no obligation to receive care at this facility.  PASARR submitted to EDS on  PASARR number received from EDS on   FL2 transmitted to all facilities in geographic area requested by pt/family on  05/10/2013 FL2 transmitted to all facilities within larger geographic area on   Patient informed that his/her managed care company has contracts with or will negotiate with  certain facilities, including the following:     Patient/family informed of bed offers received:  05/12/2013 Patient chooses bed at [facility] Physician recommends and patient chooses bed at    Patient to be transferred to  on   Patient to be transferred to facility by   The following physician request were entered in Epic:   Additional Comments: Pt has existing pasarr number.  Santa Genera, LCSW Clinical Social Worker [phone])

## 2013-05-12 NOTE — Progress Notes (Signed)
[facility] PROGRESS NOTE  [name] ZOX:[id number] DOB: 1922-02-27 DOA: 05/08/2013 PCP: [name], MD  Assessment/Plan: 1. Acute on chronic combined systolic and diastolic congestive heart failure. Patient showing slowing clinical improvement, will continue Lasix 40 mg IV twice a day for the next 24 hours, reassess tomorrow morning. Hopefully we'll be able to transition to oral Lasix. He has a LVEF of 35-40% based on transthoracic echocardiogram in February of 2014. 2. Hypothyroidism. TSH, back at 33.4 for which she was started on thyroid replacement therapy with Synthroid 50 mcg by mouth daily. This is a new diagnosis. 3. Acute on chronic hypoxemic respiratory failure. Likely secondary to acute decompensated congestive heart failure. 4. Dyslipidemia. Continue statin therapy with simvastatin 5. History of hypokalemia. Stable. Continue oral potassium replacement  Code Status: Full code Disposition Plan: Plan to continue one more day of IV diuresis, case manager consulted, plan to discharge to skilled nursing facility when stable.   HPI/Subjective: Patient is a pleasant 79 year old gentleman with a past medical history of chronic hypoxemic respiratory failure, chronic obstructive pulmonary disease, admitted to the medicine service on 05/08/2013, presenting with complaints of shortness of breath, treated for acute on chronic CHF. This morning he reports ongoing shortness of breath, not quite at his baseline. He remains on Lasix 40 mg IV twice a day. He is in negative fluid balance of 1671. Otherwise patient working with physical therapy, presently denies chest pain.  Objective: Filed Vitals:   05/12/13 1117  BP: 105/67  Pulse: 90  Temp:   Resp:     Intake/Output Summary (Last 24 hours) at 05/12/13 1406 Last data filed at 05/12/13 1022  Gross per 24 hour  Intake    240 ml  Output    351 ml  Net   -111 ml   Filed Weights   05/10/13 0355 05/11/13 1300 05/12/13 1300   Weight: 110.8 kg (244 lb 4.3 oz) 109.8 kg (242 lb 1 oz) 103.783 kg (228 lb 12.8 oz)    Exam:   General:  Patient is in no acute distress, presently on 4 L supplemental oxygen.  Cardiovascular: Regular rate rhythm normal S1-S2, patient did not have significant pedal edema  Respiratory: Bibasal crackles present, normal respiratory effort no wheezes or rhonchi, diminished breath sounds bilaterally.  Abdomen: Soft nontender nondistended positive bowels  Musculoskeletal: No edema   Data Reviewed: Basic Metabolic Panel:  Recent Labs Lab 05/08/13 1714 05/09/13 0555 05/10/13 0630 05/11/13 0553  NA 137 140 141 142  K 4.4 4.0 4.1 4.0  CL 99 102 103 102  CO2 28 28 30  34*  GLUCOSE 100* 69* 92 88  BUN 21 20 19 18   CREATININE 1.23 1.17 1.21 1.27  CALCIUM 9.3 8.9 8.7 8.8   Liver Function Tests: No results found for this basename: AST, ALT, ALKPHOS, BILITOT, PROT, ALBUMIN,  in the last 168 hours No results found for this basename: LIPASE, AMYLASE,  in the last 168 hours No results found for this basename: AMMONIA,  in the last 168 hours CBC:  Recent Labs Lab 05/08/13 1714 05/10/13 0630 05/11/13 0553  WBC 7.4 4.5 4.7  NEUTROABS 5.5  --   --   HGB 14.2 13.3 13.0  HCT 44.8 42.0 42.0  MCV 100.0 100.5* 102.4*  PLT 155 130* 133*   Cardiac Enzymes:  Recent Labs Lab 05/08/13 1714  TROPONINI <0.30   BNP (last 3 results)  Recent Labs  05/08/13 1714 05/10/13 0630 05/11/13 0553  PROBNP 7103.0* 7291.0* 7912.0*   CBG:  Recent Labs Lab 05/11/13 1638 05/11/13 2253 05/12/13 0711 05/12/13 0741 05/12/13 1126  GLUCAP 108* 95 82 86 116*    No results found for this or any previous visit (from the past 240 hour(s)).   Studies: No results found.  Scheduled Meds: . ipratropium  0.5 mg Nebulization Q6H   And  . albuterol  2.5 mg Nebulization Q6H  . cycloSPORINE  1 drop Both Eyes BID  . digoxin  0.125 mg Oral Daily  . enoxaparin (LOVENOX) injection  40 mg  Subcutaneous Q24H  . furosemide  40 mg Intravenous Q12H  . guaiFENesin  600 mg Oral BID  . levothyroxine  50 mcg Oral QAC breakfast  . lisinopril  2.5 mg Oral Daily  . mometasone-formoterol  2 puff Inhalation BID  . pantoprazole  40 mg Oral Daily  . potassium chloride SA  20 mEq Oral BID  . simvastatin  20 mg Oral q1800  . sodium chloride  3 mL Intravenous Q12H  . tamsulosin  0.4 mg Oral QHS   Continuous Infusions:   Principal Problem:   Acute on chronic systolic heart failure Active Problems:   IMPAIRED GLUCOSE TOLERANCE   Obesity   Acute-on-chronic respiratory failure   Pulmonary hypertension due to COPD   Tricuspid valve regurgitation   Chronic combined systolic and diastolic CHF (congestive heart failure)   Hypoglycemia   Unspecified hypothyroidism    Time spent: 35 minutes    Jeralyn Bennett  [facility] Pager [phone]. If 7PM-7AM, please contact night-coverage at [email or web address], password [email or web address] 05/12/2013, 2:06 PM  LOS: 4 days

## 2013-05-12 NOTE — Clinical Social Work Note (Signed)
Daughter Naida Sleight given bed offers ([facility] and [facility]) at patient request.  [facility] admissions notified by VM.  Daughter asked to contact facility tomorrow to complete preadmission paperwork.  Santa Genera, LCSW Clinical Social Worker [phone])

## 2013-05-12 NOTE — Progress Notes (Signed)
Physical Therapy Treatment Patient Details Name: SAHEED CARRINGTON MRN: [id number] DOB: 11-16-1921 Today's Date: 05/12/2013 Time: 2595-6387 PT Time Calculation (min): 28 min  PT Assessment / Plan / Recommendation  History of Present Illness 79 yo male h/o combined chf, copd, on 3 L oxygen cont at home, cafib, AAA lives with family comes in with several weeks of worsening sob and le edema and bloating.  He denies any fevers.  No problem sleeping at night.  Says he has been gaining wt but dont know how much.  Some nonprod cough.  Denies wheezing.  Feels better with tx in ED with nebs and lasix.  Spoke with daugther, who reports she has been trying to motivate her father to get up to decrease risk of continued fluid intake.  She reports that he has had HHPT before and the therapost would make him so sore that he would not even move to go to the bathroom.    PT Comments   I can't stand.    Follow Up Recommendations  SNF     Does the patient have the potential to tolerate intense rehabilitation   no  Barriers to Discharge  none      Equipment Recommendations  None recommended by PT    Recommendations for Other Services  OT  Frequency Min 3X/week   Progress towards PT Goals Progress towards PT goals: Not progressing toward goals - comment (due to 02 stats.)  Plan Current plan remains appropriate    Precautions / Restrictions Precautions Precautions: Fall Restrictions Weight Bearing Restrictions: No   Pertinent Vitals/Pain Complains of abdominal pain    Mobility  Transfers Sit to Stand: 2: Max assist (from recliner height) Stand to Sit: 5: Supervision Details for Transfer Assistance: Pt already in recliner chair when therapist came into room Ambulation/Gait Ambulation/Gait Assistance:  (did not ambulate secondary to O2 decreasing to 83% with standing and unable to recover.)    Exercises General Exercises - Lower Extremity Ankle Circles/Pumps: Both;10 reps Quad Sets: Both;10  reps Gluteal Sets: Both;10 reps Long Arc Quad: Both;5 reps Heel Slides: Both;10 reps Hip ABduction/ADduction: Both;10 reps   PT Diagnosis:   weakness; difficulty walking PT Problem List:  decreased activity tolerance PT Treatment Interventions:   there ex  PT Goals (current goals can now be found in the care plan section) Acute Rehab PT Goals Patient Stated Goal: Pt does not state goal.  He feels PNC would be fine.   Visit Information  Last PT Received On: 05/12/13 History of Present Illness: 79 yo male h/o combined chf, copd, on 3 L oxygen cont at home, cafib, AAA lives with family comes in with several weeks of worsening sob and le edema and bloating.  He denies any fevers.  No problem sleeping at night.  Says he has been gaining wt but dont know how much.  Some nonprod cough.  Denies wheezing.  Feels better with tx in ED with nebs and lasix.  Spoke with daugther, who reports she has been trying to motivate her father to get up to decrease risk of continued fluid intake.  She reports that he has had HHPT before and the therapost would make him so sore that he would not even move to go to the bathroom.     Subjective Data  Patient Stated Goal: Pt does not state goal.  He feels PNC would be fine.    Cognition  Cognition Arousal/Alertness: Awake/alert Behavior During Therapy: WFL for tasks assessed/performed Overall Cognitive Status: Within Functional  Limits for tasks assessed    Balance   NT  End of Session PT - End of Session Equipment Utilized During Treatment: Gait belt;Oxygen Activity Tolerance: Treatment limited secondary to medical complications (Comment) Patient left: in chair;with call bell/phone within reach;with chair alarm set   GP     Maui Ahart,CINDY 05/12/2013, 11:05 AM

## 2013-05-13 DIAGNOSIS — M6289 Other specified disorders of muscle: Secondary | ICD-10-CM

## 2013-05-13 DIAGNOSIS — J9600 Acute respiratory failure, unspecified whether with hypoxia or hypercapnia: Secondary | ICD-10-CM

## 2013-05-13 DIAGNOSIS — I719 Aortic aneurysm of unspecified site, without rupture: Secondary | ICD-10-CM

## 2013-05-13 LAB — BASIC METABOLIC PANEL
BUN: 21 mg/dL (ref 6–23)
CO2: 30 mEq/L (ref 19–32)
Chloride: 98 mEq/L (ref 96–112)
GFR calc Af Amer: 62 mL/min — ABNORMAL LOW (ref >90)
GFR calc non Af Amer: 53 mL/min — ABNORMAL LOW (ref >90)
Glucose, Bld: 94 mg/dL (ref 70–99)
Potassium: 4.5 mEq/L (ref 3.5–5.1)
Sodium: 136 mEq/L (ref 135–145)

## 2013-05-13 LAB — GLUCOSE, CAPILLARY
Glucose-Capillary: 81 mg/dL (ref 70–99)
Glucose-Capillary: 104 mg/dL — ABNORMAL HIGH (ref 70–99)

## 2013-05-13 LAB — CBC
HCT: 41.2 % (ref 39.0–52.0)
Hemoglobin: 12.9 g/dL — ABNORMAL LOW (ref 13.0–17.0)
MCH: 31.3 pg (ref 26.0–34.0)
MCHC: 31.3 g/dL (ref 30.0–36.0)
MCV: 100 fL (ref 78.0–100.0)

## 2013-05-13 MED ORDER — FUROSEMIDE 80 MG PO TABS: 80.0000 mg | ORAL_TABLET | Freq: Every day | ORAL | Status: DC

## 2013-05-13 MED ORDER — LISINOPRIL 2.5 MG PO TABS: 2.5000 mg | ORAL_TABLET | Freq: Every day | ORAL | Status: DC

## 2013-05-13 MED ORDER — LEVOTHYROXINE SODIUM 50 MCG PO TABS: 50.0000 ug | ORAL_TABLET | Freq: Every day | ORAL | Status: DC

## 2013-05-13 NOTE — Progress Notes (Signed)
Physical Therapy Treatment Patient Details Name: [name] MRN: [id number] DOB: [birth year] Today's Date: 05/13/2013 Time: 4010-2725 PT Time Calculation (min): 19 min  PT Assessment / Plan / Recommendation  History of Present Illness Pt reports mild left abdominal pain, feels very weak in his LEs   PT Comments   Pt was on 3 [name] O2 at time of PT visit.  O2 sat =95% at rest.  He was able to tolerate gentle seated exercise to all 4 extremeties, but O2 sat decreased to 78%.  He was again instructed in pursed lip breathing.   O2 sat quickly returned to 94%.  He refuses to ambulate stating that his legs feel too weak..he would not change his mind.  Follow Up Recommendations        Does the patient have the potential to tolerate intense rehabilitation     Barriers to Discharge        Equipment Recommendations       Recommendations for Other Services    Frequency     Progress towards PT Goals Progress towards PT goals: Not progressing toward goals - comment  Plan Current plan remains appropriate    Precautions / Restrictions Precautions Precautions: Fall Restrictions Weight Bearing Restrictions: No   Pertinent Vitals/Pain     Mobility  Transfers Transfers: Not assessed (pt refuses) Ambulation/Gait Ambulation/Gait Assistance: Not tested (comment)    Exercises General Exercises - Upper Extremity Shoulder Flexion: AAROM;Both;5 reps;Seated General Exercises - Lower Extremity Short Arc Quad: AROM;Both;10 reps;Seated Long Arc Quad: AROM;Both;10 reps;Seated Hip ABduction/ADduction: AROM;Strengthening;20 reps;Both;Seated Hip Flexion/Marching: AROM;Both;15 reps;Seated   PT Diagnosis:    PT Problem List:   PT Treatment Interventions:     PT Goals (current goals can now be found in the care plan section)    Visit Information  History of Present Illness: Pt reports mild left abdominal pain, feels very weak in his LEs    Subjective Data      Cognition   Cognition Arousal/Alertness: Awake/alert Behavior During Therapy: WFL for tasks assessed/performed Overall Cognitive Status: Within Functional Limits for tasks assessed    Balance     End of Session PT - End of Session Equipment Utilized During Treatment: Oxygen Activity Tolerance: Patient limited by fatigue Patient left: in chair;with call bell/phone within reach;with chair alarm set Nurse Communication: Mobility status   GP     [name] 05/13/2013, 11:06 AM

## 2013-05-13 NOTE — Clinical Social Work Note (Signed)
Pt will be going home today with hospice. PNC made aware. CSW will sign off but can be reconsulted if needed.  [name], [place] [phone]

## 2013-05-13 NOTE — Plan of Care (Signed)
Problem: Discharge Progression Outcomes Goal: Barriers To Progression Addressed/Resolved Outcome: Adequate for Discharge Home with hospice

## 2013-05-13 NOTE — Discharge Summary (Signed)
Physician Discharge Summary  [name] XBM:[id number] DOB: [birth year] DOA: 05/08/2013  PCP: Leonard Overman, MD  Admit date: 05/08/2013 Discharge date: 05/13/2013  Time spent: 40 minutes  Recommendations for Outpatient Follow-up:  1. Patient to be discharged on home hospice   Discharge Diagnoses:  Principal Problem:   Acute on chronic systolic heart failure Active Problems:   IMPAIRED GLUCOSE TOLERANCE   Obesity   Acute-on-chronic respiratory failure   Pulmonary hypertension due to COPD   Tricuspid valve regurgitation   Chronic combined systolic and diastolic CHF (congestive heart failure)   Hypoglycemia   Unspecified hypothyroidism   Discharge Condition: Stable  Diet recommendation: Heart healthy diet  Filed Weights   05/11/13 1300 05/12/13 1300 05/13/13 0452  Weight: 109.8 kg (242 lb 1 oz) 103.783 kg (228 lb 12.8 oz) 79.2 kg (174 lb 9.7 oz)    History of present illness:  79 yo male h/o combined chf, copd, on 3 L oxygen cont at home, cafib, AAA lives with family comes in with several weeks of worsening sob and le edema and bloating. He denies any fevers. No problem sleeping at night. Says he has been gaining wt but dont know how much. Some nonprod cough. Denies wheezing. Feels better with tx in ED with nebs and lasix.  Hospital Course:  [name] is a pleasant 6 -year-old gentleman with a past medical history chronic hypoxemic respiratory failure, chronic obstructive pulmonary disease, on home oxygen, who was admitted to the medicine service on 05/08/2013 at which time he presented with complaints of shortness of breath. Initial chest x-ray showed findings consistent with CHF, radiology reporting cardiomegaly with vascular congestion. Also reported was right pleural effusion and right basilar consolidation/atelectasis. Chest x-ray later repeated, which showed probable layered right pleural effusion, enlarged cardiac silhouette with pulmonary vascular congestion,  minimal by basilar atelectasis. Initial lab work also included a BNP which was found to be elevated at 7103. Symptoms attributed to acute decompensated congestive heart failure. Patient having a history of chronic combined systolic and diastolic congestive heart failure, having an LVEF of 35-40% based on transthoracic echocardiogram in February of 2014. He was diuresed with IV Lasix, having subsequent improvement to respiratory complaints. He was evaluated by physical therapy during this hospitalization. Patient had poor tolerance to physical therapy exercises and there was concern about him being and will undergo PT at skilled nursing facility. I discussed goals of care with his daughter [name] over telephone conversation. Patient previously had been on hospice services then taken off. His daughter expresses I. to have him home with her rather than discharge him to skilled nursing facility. She agrees with focusing patient's care on comfort rather than pursuing further potentially aggressive or invasive procedures. She states that her father would prefer to be at home with her, and would likely do better in this setting. I discussed the philosophy of palliative care and hospice services with her and would like to resume home hospice. I explained that we can continue treating with diuretics, supportive care, with the focus of his care on management of symptoms and comfort. I discussed case with case manager to set patient up with home hospice. He was discharged to his home on 05/13/2013 in stable condition.   Consultations:  Case Management  Discharge Exam: Filed Vitals:   05/13/13 1013  BP: 104/61  Pulse:   Temp:   Resp:     General: Chronically ill appearing, in no acute distress, presently on 4 L supplemental oxygen.  Cardiovascular: Regular rate  rhythm normal S1-S2, patient did not have significant pedal edema  Respiratory: Bibasal crackles present, normal respiratory effort no wheezes or  rhonchi, diminished breath sounds bilaterally.  Abdomen: Soft nontender nondistended positive bowels  Musculoskeletal: No edema   Discharge Instructions  Discharge Orders   Future Orders Complete By Expires   Call MD for:  difficulty breathing, headache or visual disturbances  As directed    Call MD for:  extreme fatigue  As directed    Call MD for:  persistant nausea and vomiting  As directed    Call MD for:  temperature >100.4  As directed    Diet - low sodium heart healthy  As directed    Increase activity slowly  As directed        Medication List         albuterol (2.5 MG/3ML) 0.083% nebulizer solution  Commonly known as:  PROVENTIL  Take 2.5 mg by nebulization every 6 (six) hours as needed for wheezing or shortness of breath.     CENTRUM SILVER tablet  Take 1 tablet by mouth daily.     cycloSPORINE 0.05 % ophthalmic emulsion  Commonly known as:  RESTASIS  Place 1 drop into both eyes 2 (two) times daily.     digoxin 0.125 MG tablet  Commonly known as:  LANOXIN  Take 0.125 mg by mouth daily.     Fluticasone-Salmeterol 100-50 MCG/DOSE Aepb  Commonly known as:  ADVAIR  Inhale 1 puff into the lungs 2 (two) times daily.     furosemide 80 MG tablet  Commonly known as:  LASIX  Take 1 tablet (80 mg total) by mouth daily.     guaiFENesin 600 MG 12 hr tablet  Commonly known as:  MUCINEX  Take 600 mg by mouth 2 (two) times daily.     ipratropium 0.02 % nebulizer solution  Commonly known as:  ATROVENT  Take 0.5 mg by nebulization every 6 (six) hours as needed for wheezing or shortness of breath.     levothyroxine 50 MCG tablet  Commonly known as:  SYNTHROID, LEVOTHROID  Take 1 tablet (50 mcg total) by mouth daily before breakfast.     lisinopril 2.5 MG tablet  Commonly known as:  PRINIVIL,ZESTRIL  Take 1 tablet (2.5 mg total) by mouth daily.     lovastatin 40 MG tablet  Commonly known as:  MEVACOR  Take 40 mg by mouth 4 (four) times a week. Patient takes on  Sunday,Monday,Wednesday,Friday     omeprazole 40 MG capsule  Commonly known as:  PRILOSEC  Take 40 mg by mouth daily.     potassium chloride SA 20 MEQ tablet  Commonly known as:  K-DUR,KLOR-CON  Take 20 mEq by mouth 2 (two) times daily.     tamsulosin 0.4 MG Caps capsule  Commonly known as:  FLOMAX  Take 0.4 mg by mouth at bedtime.       Allergies  Allergen Reactions  . Aspirin Other (See Comments)    CANNOT TAKE DE TO BLEEDING  . Gabapentin     Patient cannot take over 200 mg dose at a time as it causes jerking or spasms  . Naproxen Other (See Comments)    CANNOT TAKE/NO REASON GIVEN       Follow-up Information   Follow up with Leonard Overman, MD In 1 week.   Specialty:  Family Medicine   Contact information:   [address] [place] [address] [phone]  The results of significant diagnostics from this hospitalization (including imaging, microbiology, ancillary and laboratory) are listed below for reference.    Significant Diagnostic Studies: Ct Abdomen Pelvis Wo Contrast  05/13/2013   CLINICAL DATA:  Painful umbilical lump  EXAM: CT ABDOMEN AND PELVIS WITHOUT CONTRAST  TECHNIQUE: Multidetector CT imaging of the abdomen and pelvis was performed following the standard protocol without intravenous contrast.  COMPARISON:  07/31/2012  FINDINGS: BODY WALL: Diffuse body wall edema from anasarca. There is a fatty umbilical hernia with retained ascites. Fat is minimally infiltrated. More superiorly, there is additional, smaller/tiny, midline hernia containing only fat.  LOWER CHEST: Cardiomegaly. Status post aortic and mitral valve replacement. Coronary artery atherosclerosis. Moderate right pleural effusion with passive atelectasis. Small left pleural effusion.  ABDOMEN/PELVIS:  Liver: The liver appears mildly lobulated, question cirrhosis. Increased density of the liver is likely from amiodarone use given the biatrial enlargement.  Biliary:  Cholelithiasis. No definite wall thickening. No clinical indication of right upper quadrant pain.  Pancreas: Unremarkable.  Spleen: Granulomatous changes.  Adrenals: Unremarkable.  Kidneys and ureters: Numerous bilateral low dense renal lesions, Size stable from previous imaging. No hydronephrosis.  Bladder: Bladder wall thickening from outlet obstruction presumably.  Reproductive: Prostate enlargement, deforming the bladder base. Evidence of previous trans urethral prostatectomy.  Bowel: No obstruction. Colonic diverticulosis.  Retroperitoneum: Resolved left retroperitoneal hemorrhage. Retroperitoneal edema is likely from volume overload. New pockets of low-density near the GE junction is likely herniated, entrapped ascitic fluid.  Peritoneum: New, moderate volume ascites.  Fluid is water density.  Vascular: Unchanged size an infrarenal abdominal aortic aneurysm, 5 cm in maximal diameter.  OSSEOUS: Diffuse and advanced degenerative disc and facet disease with diffuse foraminal stenosis from spurs and disc narrowing.  IMPRESSION: 1. The patient's umbilical mass represents a small hernia containing ascitic fluid and omentum. 2. Volume overload pleural effusions, ascites, and body wall edema. 3. Question early cirrhosis. 4. Chronic findings, including a 5cm abdominal aortic aneurysm and cholelithiasis, described above.   Electronically Signed   By: Tiburcio Pea M.D.   On: 05/13/2013 03:10   Dg Ankle Complete Right  04/13/2013   CLINICAL DATA:  Right ankle pain.  EXAM: RIGHT ANKLE - COMPLETE 3+ VIEW  COMPARISON:  None.  FINDINGS: There is no evidence of fracture, dislocation, or joint effusion. There is no evidence of arthropathy or other focal bone abnormality. Soft tissues are unremarkable.  IMPRESSION: Normal right ankle.   Electronically Signed   By: Roque Lias M.D.   On: 04/13/2013 21:03   US Venous Img Lower Unilateral Left  04/14/2013   CLINICAL DATA:  Edema. History of prostate carcinoma.  EXAM:  LEFT LOWER EXTREMITY VENOUS DOPPLER ULTRASOUND  TECHNIQUE: Gray-scale sonography with compression, as well as color and duplex ultrasound, were performed to evaluate the deep venous system from the level of the common femoral vein through the popliteal and proximal calf veins.  COMPARISON:  None  FINDINGS: Normal compressibility of the common femoral, superficial femoral, and popliteal veins, as well as the proximal calf veins. No filling defects to suggest DVT on grayscale or color Doppler imaging. Doppler waveforms show normal direction of venous flow, normal respiratory phasicity and response to augmentation.  IMPRESSION: No evidence of  lower extremity deep vein thrombosis.   Electronically Signed   By: Oley Balm M.D.   On: 04/14/2013 13:36   Dg Chest Port 1 View  05/10/2013   CLINICAL DATA:  CHF, COPD, shortness of breath, hypertension, follow-up  EXAM: PORTABLE  CHEST - 1 VIEW  COMPARISON:  Portable exam 0720 hr compared to 05/08/2013  FINDINGS: Enlargement of cardiac silhouette post median sternotomy.  Pulmonary vascular congestion.  Probable layered right pleural effusion.  Minimal bibasilar atelectasis.  No gross pulmonary edema or segmental consolidation.  No pneumothorax.  Bones demineralized.  IMPRESSION: Probable layered right pleural effusion.  Enlargement of cardiac silhouette with pulmonary vascular congestion post median sternotomy.  Minimal bibasilar atelectasis.   Electronically Signed   By: Ulyses Southward M.D.   On: 05/10/2013 07:51   Dg Chest Portable 1 View  05/08/2013   CLINICAL DATA:  History of MVP with MVR, hypertension, COPD, CHF, atrial fibrillation, prostate cancer, AAA, diabetes, femoral artery stent. Shortness of breath and swelling.  EXAM: PORTABLE CHEST - 1 VIEW  COMPARISON:  04/13/2013  FINDINGS: Patient has had median sternotomy and CABG. The heart is enlarged. There is pulmonary vascular congestion. Right pleural effusion right basilar opacity is present, consistent with  consolidation and/or atelectasis. Left lower lobe atelectasis is present.  IMPRESSION: 1. Cardiomegaly and vascular congestion. 2. Right pleural effusion and right basilar consolidation.   Electronically Signed   By: Rosalie Gums M.D.   On: 05/08/2013 17:27   Dg Chest Port 1 View  04/13/2013   CLINICAL DATA:  Orthostatic hypotension with diarrhea and weakness.  EXAM: PORTABLE CHEST - 1 VIEW  COMPARISON:  04/02/2013 and CT 04/01/2013  FINDINGS: Again noted is enlargement of the cardiac silhouette. Again noted is volume loss in right hemithorax with persistent right basilar densities. Again noted are prominent lung markings bilaterally. No evidence for a pneumothorax.  IMPRESSION: Minimal change from the previous examination. Persistent volume loss in the right hemithorax with right basilar densities.  Cardiomegaly.   Electronically Signed   By: Richarda Overlie M.D.   On: 04/13/2013 17:48    Microbiology: No results found for this or any previous visit (from the past 240 hour(s)).   Labs: Basic Metabolic Panel:  Recent Labs Lab 05/08/13 1714 05/09/13 0555 05/10/13 0630 05/11/13 0553 05/13/13 0525  NA 137 140 141 142 136  K 4.4 4.0 4.1 4.0 4.5  CL 99 102 103 102 98  CO2 28 28 30  34* 30  GLUCOSE 100* 69* 92 88 94  BUN 21 20 19 18 21   CREATININE 1.23 1.17 1.21 1.27 1.17  CALCIUM 9.3 8.9 8.7 8.8 8.5   Liver Function Tests: No results found for this basename: AST, ALT, ALKPHOS, BILITOT, PROT, ALBUMIN,  in the last 168 hours No results found for this basename: LIPASE, AMYLASE,  in the last 168 hours No results found for this basename: AMMONIA,  in the last 168 hours CBC:  Recent Labs Lab 05/08/13 1714 05/10/13 0630 05/11/13 0553 05/13/13 0525  WBC 7.4 4.5 4.7 4.8  NEUTROABS 5.5  --   --   --   HGB 14.2 13.3 13.0 12.9*  HCT 44.8 42.0 42.0 41.2  MCV 100.0 100.5* 102.4* 100.0  PLT 155 130* 133* 126*   Cardiac Enzymes:  Recent Labs Lab 05/08/13 1714  TROPONINI <0.30   BNP: BNP  (last 3 results)  Recent Labs  05/08/13 1714 05/10/13 0630 05/11/13 0553  PROBNP 7103.0* 7291.0* 7912.0*   CBG:  Recent Labs Lab 05/12/13 1126 05/12/13 1615 05/12/13 2046 05/13/13 0743 05/13/13 1138  GLUCAP 116* 91 101* 81 104*       Signed:  Hawley Pavia  [facility] 05/13/2013, 1:00 PM

## 2013-05-14 ENCOUNTER — Telehealth: Payer: Self-pay | Admitting: Family Medicine

## 2013-05-14 NOTE — Telephone Encounter (Signed)
fl2 in process. Will be completed today

## 2013-05-27 ENCOUNTER — Telehealth: Payer: Self-pay | Admitting: Family Medicine

## 2013-05-27 ENCOUNTER — Ambulatory Visit: Admitting: Family Medicine

## 2013-05-27 NOTE — Telephone Encounter (Signed)
D/w staff member who took msg, clarification is that [name] the daughter intends to remove father from facility and take him to the ED

## 2013-05-27 NOTE — Telephone Encounter (Signed)
Called and spoke with nurse Tresa Endo at [facility].   Patient has been seen by NP at facility and will be seen again today.  Dr. Selena Batten the in house doctor comes in on the weekends.  Daughter has been notified by [facility] staff of this.  I will call again and reassure daughter.

## 2013-05-27 NOTE — Telephone Encounter (Signed)
Called and spoke with daughter who is concerned that her father isnt receiving adequate care.  She is going to see the NP today.  She is considering bringing father home and caring for him herself.  FYI

## 2013-05-27 NOTE — Telephone Encounter (Signed)
Nursing staff has been speaking to facility will f/u on this

## 2013-05-27 NOTE — Telephone Encounter (Signed)
Noted  

## 2013-06-12 ENCOUNTER — Emergency Department (HOSPITAL_COMMUNITY): Payer: Medicare Other

## 2013-06-12 ENCOUNTER — Encounter (HOSPITAL_COMMUNITY): Payer: Self-pay | Admitting: Emergency Medicine

## 2013-06-12 ENCOUNTER — Inpatient Hospital Stay (HOSPITAL_COMMUNITY)
Admission: EM | Admit: 2013-06-12 | Discharge: 2013-06-19 | DRG: 291 | Disposition: A | Discharge status: Home Health | Payer: Medicare Other | Attending: Internal Medicine | Admitting: Internal Medicine

## 2013-06-12 DIAGNOSIS — I5033 Acute on chronic diastolic (congestive) heart failure: Secondary | ICD-10-CM

## 2013-06-12 DIAGNOSIS — B372 Candidiasis of skin and nail: Secondary | ICD-10-CM | POA: Diagnosis present

## 2013-06-12 DIAGNOSIS — E039 Hypothyroidism, unspecified: Secondary | ICD-10-CM

## 2013-06-12 DIAGNOSIS — M1990 Unspecified osteoarthritis, unspecified site: Secondary | ICD-10-CM | POA: Diagnosis present

## 2013-06-12 DIAGNOSIS — K219 Gastro-esophageal reflux disease without esophagitis: Secondary | ICD-10-CM

## 2013-06-12 DIAGNOSIS — E162 Hypoglycemia, unspecified: Secondary | ICD-10-CM

## 2013-06-12 DIAGNOSIS — I071 Rheumatic tricuspid insufficiency: Secondary | ICD-10-CM

## 2013-06-12 DIAGNOSIS — E669 Obesity, unspecified: Secondary | ICD-10-CM

## 2013-06-12 DIAGNOSIS — I341 Nonrheumatic mitral (valve) prolapse: Secondary | ICD-10-CM

## 2013-06-12 DIAGNOSIS — Z66 Do not resuscitate: Secondary | ICD-10-CM | POA: Diagnosis present

## 2013-06-12 DIAGNOSIS — N179 Acute kidney failure, unspecified: Secondary | ICD-10-CM | POA: Diagnosis present

## 2013-06-12 DIAGNOSIS — N183 Chronic kidney disease, stage 3 unspecified: Secondary | ICD-10-CM

## 2013-06-12 DIAGNOSIS — J9602 Acute respiratory failure with hypercapnia: Secondary | ICD-10-CM

## 2013-06-12 DIAGNOSIS — IMO0002 Reserved for concepts with insufficient information to code with codable children: Secondary | ICD-10-CM

## 2013-06-12 DIAGNOSIS — E119 Type 2 diabetes mellitus without complications: Secondary | ICD-10-CM | POA: Diagnosis present

## 2013-06-12 DIAGNOSIS — Z8546 Personal history of malignant neoplasm of prostate: Secondary | ICD-10-CM

## 2013-06-12 DIAGNOSIS — I350 Nonrheumatic aortic (valve) stenosis: Secondary | ICD-10-CM | POA: Diagnosis present

## 2013-06-12 DIAGNOSIS — R601 Generalized edema: Secondary | ICD-10-CM

## 2013-06-12 DIAGNOSIS — I252 Old myocardial infarction: Secondary | ICD-10-CM

## 2013-06-12 DIAGNOSIS — A419 Sepsis, unspecified organism: Secondary | ICD-10-CM

## 2013-06-12 DIAGNOSIS — I5042 Chronic combined systolic (congestive) and diastolic (congestive) heart failure: Secondary | ICD-10-CM

## 2013-06-12 DIAGNOSIS — Z951 Presence of aortocoronary bypass graft: Secondary | ICD-10-CM

## 2013-06-12 DIAGNOSIS — E8729 Other acidosis: Secondary | ICD-10-CM

## 2013-06-12 DIAGNOSIS — J441 Chronic obstructive pulmonary disease with (acute) exacerbation: Secondary | ICD-10-CM

## 2013-06-12 DIAGNOSIS — H9190 Unspecified hearing loss, unspecified ear: Secondary | ICD-10-CM | POA: Diagnosis present

## 2013-06-12 DIAGNOSIS — I482 Chronic atrial fibrillation, unspecified: Secondary | ICD-10-CM

## 2013-06-12 DIAGNOSIS — A498 Other bacterial infections of unspecified site: Secondary | ICD-10-CM | POA: Diagnosis present

## 2013-06-12 DIAGNOSIS — I959 Hypotension, unspecified: Secondary | ICD-10-CM

## 2013-06-12 DIAGNOSIS — J9620 Acute and chronic respiratory failure, unspecified whether with hypoxia or hypercapnia: Secondary | ICD-10-CM

## 2013-06-12 DIAGNOSIS — E785 Hyperlipidemia, unspecified: Secondary | ICD-10-CM

## 2013-06-12 DIAGNOSIS — R188 Other ascites: Secondary | ICD-10-CM | POA: Diagnosis present

## 2013-06-12 DIAGNOSIS — N400 Enlarged prostate without lower urinary tract symptoms: Secondary | ICD-10-CM

## 2013-06-12 DIAGNOSIS — R197 Diarrhea, unspecified: Secondary | ICD-10-CM

## 2013-06-12 DIAGNOSIS — J449 Chronic obstructive pulmonary disease, unspecified: Secondary | ICD-10-CM | POA: Diagnosis present

## 2013-06-12 DIAGNOSIS — D509 Iron deficiency anemia, unspecified: Secondary | ICD-10-CM

## 2013-06-12 DIAGNOSIS — R609 Edema, unspecified: Secondary | ICD-10-CM

## 2013-06-12 DIAGNOSIS — I719 Aortic aneurysm of unspecified site, without rupture: Secondary | ICD-10-CM

## 2013-06-12 DIAGNOSIS — E872 Acidosis: Secondary | ICD-10-CM

## 2013-06-12 DIAGNOSIS — G9340 Encephalopathy, unspecified: Secondary | ICD-10-CM

## 2013-06-12 DIAGNOSIS — I739 Peripheral vascular disease, unspecified: Secondary | ICD-10-CM

## 2013-06-12 DIAGNOSIS — R369 Urethral discharge, unspecified: Secondary | ICD-10-CM | POA: Diagnosis present

## 2013-06-12 DIAGNOSIS — R001 Bradycardia, unspecified: Secondary | ICD-10-CM

## 2013-06-12 DIAGNOSIS — I5040 Unspecified combined systolic (congestive) and diastolic (congestive) heart failure: Secondary | ICD-10-CM

## 2013-06-12 DIAGNOSIS — J4489 Other specified chronic obstructive pulmonary disease: Secondary | ICD-10-CM | POA: Diagnosis present

## 2013-06-12 DIAGNOSIS — T380X5A Adverse effect of glucocorticoids and synthetic analogues, initial encounter: Secondary | ICD-10-CM

## 2013-06-12 DIAGNOSIS — Z954 Presence of other heart-valve replacement: Secondary | ICD-10-CM

## 2013-06-12 DIAGNOSIS — I059 Rheumatic mitral valve disease, unspecified: Secondary | ICD-10-CM | POA: Diagnosis present

## 2013-06-12 DIAGNOSIS — D7589 Other specified diseases of blood and blood-forming organs: Secondary | ICD-10-CM

## 2013-06-12 DIAGNOSIS — Z87891 Personal history of nicotine dependence: Secondary | ICD-10-CM

## 2013-06-12 DIAGNOSIS — J309 Allergic rhinitis, unspecified: Secondary | ICD-10-CM

## 2013-06-12 DIAGNOSIS — J9600 Acute respiratory failure, unspecified whether with hypoxia or hypercapnia: Secondary | ICD-10-CM

## 2013-06-12 DIAGNOSIS — I509 Heart failure, unspecified: Secondary | ICD-10-CM

## 2013-06-12 DIAGNOSIS — Z0000 Encounter for general adult medical examination without abnormal findings: Secondary | ICD-10-CM

## 2013-06-12 DIAGNOSIS — N390 Urinary tract infection, site not specified: Secondary | ICD-10-CM | POA: Diagnosis present

## 2013-06-12 DIAGNOSIS — I5021 Acute systolic (congestive) heart failure: Secondary | ICD-10-CM

## 2013-06-12 DIAGNOSIS — I2510 Atherosclerotic heart disease of native coronary artery without angina pectoris: Secondary | ICD-10-CM

## 2013-06-12 DIAGNOSIS — R269 Unspecified abnormalities of gait and mobility: Secondary | ICD-10-CM

## 2013-06-12 DIAGNOSIS — I498 Other specified cardiac arrhythmias: Secondary | ICD-10-CM | POA: Diagnosis present

## 2013-06-12 DIAGNOSIS — M7989 Other specified soft tissue disorders: Secondary | ICD-10-CM

## 2013-06-12 DIAGNOSIS — I359 Nonrheumatic aortic valve disorder, unspecified: Secondary | ICD-10-CM | POA: Diagnosis present

## 2013-06-12 DIAGNOSIS — I4891 Unspecified atrial fibrillation: Secondary | ICD-10-CM | POA: Diagnosis present

## 2013-06-12 DIAGNOSIS — R32 Unspecified urinary incontinence: Secondary | ICD-10-CM | POA: Diagnosis present

## 2013-06-12 DIAGNOSIS — R739 Hyperglycemia, unspecified: Secondary | ICD-10-CM

## 2013-06-12 DIAGNOSIS — C61 Malignant neoplasm of prostate: Secondary | ICD-10-CM

## 2013-06-12 DIAGNOSIS — Z9981 Dependence on supplemental oxygen: Secondary | ICD-10-CM

## 2013-06-12 DIAGNOSIS — Z79899 Other long term (current) drug therapy: Secondary | ICD-10-CM

## 2013-06-12 DIAGNOSIS — N476 Balanoposthitis: Secondary | ICD-10-CM | POA: Diagnosis present

## 2013-06-12 DIAGNOSIS — E739 Lactose intolerance, unspecified: Secondary | ICD-10-CM

## 2013-06-12 DIAGNOSIS — D696 Thrombocytopenia, unspecified: Secondary | ICD-10-CM | POA: Diagnosis present

## 2013-06-12 DIAGNOSIS — R6521 Severe sepsis with septic shock: Secondary | ICD-10-CM

## 2013-06-12 DIAGNOSIS — A0472 Enterocolitis due to Clostridium difficile, not specified as recurrent: Secondary | ICD-10-CM

## 2013-06-12 DIAGNOSIS — I129 Hypertensive chronic kidney disease with stage 1 through stage 4 chronic kidney disease, or unspecified chronic kidney disease: Secondary | ICD-10-CM | POA: Diagnosis present

## 2013-06-12 DIAGNOSIS — J9610 Chronic respiratory failure, unspecified whether with hypoxia or hypercapnia: Secondary | ICD-10-CM

## 2013-06-12 DIAGNOSIS — Z8249 Family history of ischemic heart disease and other diseases of the circulatory system: Secondary | ICD-10-CM

## 2013-06-12 DIAGNOSIS — I5023 Acute on chronic systolic (congestive) heart failure: Secondary | ICD-10-CM

## 2013-06-12 DIAGNOSIS — I2723 Pulmonary hypertension due to lung diseases and hypoxia: Secondary | ICD-10-CM

## 2013-06-12 DIAGNOSIS — R6510 Systemic inflammatory response syndrome (SIRS) of non-infectious origin without acute organ dysfunction: Secondary | ICD-10-CM

## 2013-06-12 DIAGNOSIS — M1710 Unilateral primary osteoarthritis, unspecified knee: Secondary | ICD-10-CM

## 2013-06-12 DIAGNOSIS — I5043 Acute on chronic combined systolic (congestive) and diastolic (congestive) heart failure: Principal | ICD-10-CM

## 2013-06-12 DIAGNOSIS — I2789 Other specified pulmonary heart diseases: Secondary | ICD-10-CM | POA: Diagnosis present

## 2013-06-12 DIAGNOSIS — Z823 Family history of stroke: Secondary | ICD-10-CM

## 2013-06-12 DIAGNOSIS — J189 Pneumonia, unspecified organism: Secondary | ICD-10-CM

## 2013-06-12 DIAGNOSIS — L22 Diaper dermatitis: Secondary | ICD-10-CM | POA: Diagnosis present

## 2013-06-12 DIAGNOSIS — R109 Unspecified abdominal pain: Secondary | ICD-10-CM

## 2013-06-12 LAB — HEPATIC FUNCTION PANEL
ALT: 11 U/L (ref 0–53)
AST: 25 U/L (ref 0–37)
Albumin: 3.7 g/dL (ref 3.5–5.2)
Alkaline Phosphatase: 62 U/L (ref 39–117)
Total Protein: 7.7 g/dL (ref 6.0–8.3)

## 2013-06-12 LAB — BASIC METABOLIC PANEL
BUN: 36 mg/dL — ABNORMAL HIGH (ref 6–23)
CO2: 33 mEq/L — ABNORMAL HIGH (ref 19–32)
Chloride: 96 mEq/L (ref 96–112)
GFR calc Af Amer: 42 mL/min — ABNORMAL LOW (ref >90)
Potassium: 4.7 mEq/L (ref 3.5–5.1)

## 2013-06-12 LAB — CBC WITH DIFFERENTIAL/PLATELET
HCT: 42.1 % (ref 39.0–52.0)
Hemoglobin: 13.3 g/dL (ref 13.0–17.0)
Lymphocytes Relative: 9 % — ABNORMAL LOW (ref 12–46)
MCHC: 31.6 g/dL (ref 30.0–36.0)
Monocytes Absolute: 0.9 10*3/uL (ref 0.1–1.0)
Monocytes Relative: 10 % (ref 3–12)
Neutro Abs: 7.8 10*3/uL — ABNORMAL HIGH (ref 1.7–7.7)
Platelets: 139 10*3/uL — ABNORMAL LOW (ref 150–400)
RDW: 16.7 % — ABNORMAL HIGH (ref 11.5–15.5)
WBC: 9.7 10*3/uL (ref 4.0–10.5)

## 2013-06-12 LAB — TROPONIN I: Troponin I: <0.3 ng/mL (ref <0.30)

## 2013-06-12 MED ORDER — SODIUM CHLORIDE 0.9 % IJ SOLN: 3.0000 mL | INTRAMUSCULAR | Status: DC | PRN

## 2013-06-12 MED ORDER — HYDROCODONE-ACETAMINOPHEN 5-325 MG PO TABS
1.0000 | ORAL_TABLET | ORAL | Status: DC | PRN
  Administered 2013-06-14: 1 via ORAL
  Administered 2013-06-16 – 2013-06-19 (×4): 2 via ORAL
  Filled 2013-06-12 (×2): qty 1
  Filled 2013-06-12 (×3): qty 2
  Filled 2013-06-12: qty 1

## 2013-06-12 MED ORDER — TAMSULOSIN HCL 0.4 MG PO CAPS
0.4000 mg | ORAL_CAPSULE | Freq: Every day | ORAL | Status: DC
  Administered 2013-06-12 – 2013-06-18 (×7): 0.4 mg via ORAL
  Filled 2013-06-12 (×7): qty 1

## 2013-06-12 MED ORDER — SIMVASTATIN 20 MG PO TABS
20.0000 mg | ORAL_TABLET | Freq: Every day | ORAL | Status: DC
  Administered 2013-06-13 – 2013-06-18 (×6): 20 mg via ORAL
  Filled 2013-06-12 (×6): qty 1

## 2013-06-12 MED ORDER — ALBUTEROL SULFATE (5 MG/ML) 0.5% IN NEBU
2.5000 mg | INHALATION_SOLUTION | Freq: Four times a day (QID) | RESPIRATORY_TRACT | Status: DC | PRN
  Administered 2013-06-13 – 2013-06-18 (×5): 2.5 mg via RESPIRATORY_TRACT
  Filled 2013-06-12 (×5): qty 0.5

## 2013-06-12 MED ORDER — CYCLOSPORINE 0.05 % OP EMUL
1.0000 [drp] | Freq: Two times a day (BID) | OPHTHALMIC | Status: DC
  Administered 2013-06-13 – 2013-06-19 (×13): 1 [drp] via OPHTHALMIC
  Filled 2013-06-12 (×18): qty 1

## 2013-06-12 MED ORDER — MOMETASONE FURO-FORMOTEROL FUM 100-5 MCG/ACT IN AERO
INHALATION_SPRAY | RESPIRATORY_TRACT | Status: AC
  Filled 2013-06-12: qty 8.8

## 2013-06-12 MED ORDER — NYSTATIN 100000 UNIT/GM EX POWD
CUTANEOUS | Status: AC
  Filled 2013-06-12: qty 15

## 2013-06-12 MED ORDER — SODIUM CHLORIDE 0.9 % IV SOLN: 250.0000 mL | INTRAVENOUS | Status: DC | PRN

## 2013-06-12 MED ORDER — DIGOXIN 125 MCG PO TABS
0.1250 mg | ORAL_TABLET | Freq: Every day | ORAL | Status: DC
  Administered 2013-06-13 – 2013-06-19 (×7): 0.125 mg via ORAL
  Filled 2013-06-12 (×7): qty 1

## 2013-06-12 MED ORDER — FUROSEMIDE 10 MG/ML IJ SOLN
40.0000 mg | Freq: Two times a day (BID) | INTRAMUSCULAR | Status: DC
  Administered 2013-06-12: 40 mg via INTRAVENOUS
  Filled 2013-06-12: qty 4

## 2013-06-12 MED ORDER — SODIUM CHLORIDE 0.9 % IJ SOLN
3.0000 mL | Freq: Two times a day (BID) | INTRAMUSCULAR | Status: DC
  Administered 2013-06-12 – 2013-06-18 (×9): 3 mL via INTRAVENOUS

## 2013-06-12 MED ORDER — LEVOTHYROXINE SODIUM 75 MCG PO TABS
75.0000 ug | ORAL_TABLET | Freq: Every day | ORAL | Status: DC
  Administered 2013-06-13 – 2013-06-14 (×2): 75 ug via ORAL
  Filled 2013-06-12 (×2): qty 1

## 2013-06-12 MED ORDER — FUROSEMIDE 10 MG/ML IJ SOLN
40.0000 mg | Freq: Once | INTRAMUSCULAR | Status: AC
  Administered 2013-06-12: 40 mg via INTRAVENOUS
  Filled 2013-06-12: qty 4

## 2013-06-12 MED ORDER — CLOTRIMAZOLE-BETAMETHASONE 1-0.05 % EX CREA
TOPICAL_CREAM | Freq: Two times a day (BID) | CUTANEOUS | Status: AC
  Administered 2013-06-12 – 2013-06-13 (×2): via TOPICAL
  Administered 2013-06-13: 1 via TOPICAL
  Administered 2013-06-14 – 2013-06-15 (×3): via TOPICAL
  Filled 2013-06-12 (×2): qty 15

## 2013-06-12 MED ORDER — IPRATROPIUM BROMIDE 0.02 % IN SOLN
0.5000 mg | Freq: Four times a day (QID) | RESPIRATORY_TRACT | Status: DC | PRN
  Administered 2013-06-13 – 2013-06-18 (×5): 0.5 mg via RESPIRATORY_TRACT
  Filled 2013-06-12 (×5): qty 2.5

## 2013-06-12 MED ORDER — GUAIFENESIN ER 600 MG PO TB12
600.0000 mg | ORAL_TABLET | Freq: Two times a day (BID) | ORAL | Status: DC
  Administered 2013-06-12 – 2013-06-19 (×14): 600 mg via ORAL
  Filled 2013-06-12 (×14): qty 1

## 2013-06-12 MED ORDER — PANTOPRAZOLE SODIUM 40 MG PO TBEC
40.0000 mg | DELAYED_RELEASE_TABLET | Freq: Every day | ORAL | Status: DC
  Administered 2013-06-13 – 2013-06-19 (×7): 40 mg via ORAL
  Filled 2013-06-12 (×6): qty 1

## 2013-06-12 MED ORDER — MOMETASONE FURO-FORMOTEROL FUM 100-5 MCG/ACT IN AERO
2.0000 | INHALATION_SPRAY | Freq: Two times a day (BID) | RESPIRATORY_TRACT | Status: DC
  Administered 2013-06-13 – 2013-06-19 (×13): 2 via RESPIRATORY_TRACT
  Filled 2013-06-12: qty 8.8

## 2013-06-12 MED ORDER — LIDOCAINE HCL 2 % EX GEL
CUTANEOUS | Status: AC
  Administered 2013-06-13
  Filled 2013-06-12: qty 10

## 2013-06-12 MED ORDER — NYSTATIN 100000 UNIT/GM EX POWD
Freq: Two times a day (BID) | CUTANEOUS | Status: DC
  Administered 2013-06-12: 23:00:00 via TOPICAL
  Administered 2013-06-13: 1 via TOPICAL
  Administered 2013-06-13 – 2013-06-14 (×2): via TOPICAL
  Administered 2013-06-14: 1 via TOPICAL
  Administered 2013-06-15 – 2013-06-19 (×9): via TOPICAL
  Filled 2013-06-12: qty 15

## 2013-06-12 NOTE — H&P (Signed)
PCP:   [name], MD   Chief Complaint:  Generalized swelling  HPI: 79 year old male who   has a past medical history of Mitral valve prolapse (2003); Obesity; Osteoarthritis; Hypertension; Hyperlipidemia; COPD (chronic obstructive pulmonary disease); CHF (congestive heart failure) (diastolic 10/2010); Pneumonia; Impaired glucose tolerance; Atrial fibrillation, chronic; Prostate cancer; GERD (gastroesophageal reflux disease); Arteriosclerotic cardiovascular disease (ASCVD) (1994); Hard of hearing; Anemia, iron deficiency; AAA (abdominal aortic aneurysm); Gastric ulcer (2004); Renal cysts, acquired, bilateral; Adenomatous polyps (04/07/2012); HYPERTENSION (12/17/2007); NSTEMI (non-ST elevated myocardial infarction) (01/18/2011); Diabetes mellitus, type II (07/13/12); Macrocytosis without anemia (08/13/2012); Acute systolic congestive heart failure (08/15/2012); Aortic stenosis, moderate (08/15/2012); Pulmonary hypertension due to COPD (08/15/2012); Tricuspid valve regurgitation (08/15/2012); Retroperitoneal bleed (06/2012); On home O2; Left leg swelling (03/2013); Clostridium difficile colitis (03/2013); and Unspecified hypothyroidism (05/11/2013). Today was brought to the hospital from [facility] for worsening generalized swelling and shortness of breath. Patient has a history of CHF, CAD last EF was 35-40% as per echo done in February 2014. Patient also has moderate aortic stenosis. He also has history of bone the hypertension due to COPD. As per daughter, patient has gained more than 20 pounds weight over past 1 month. Patient's appetite has been good, he does have mild shortness of breath. He denies chest pain no nausea vomiting or diarrhea. Patient denies fever or dysuria. But does have redness and itching in the penis, patient wears diapers all the time. Patient was evaluated by hospice in the previous admission and was deemed not hospice eligible. So he was sent to nursing facility. Today  in the ED patient is found to have elevated BNP which is chronically elevated to 9057.  Allergies:   Allergies  Allergen Reactions  . Aspirin Other (See Comments)    CANNOT TAKE DE TO BLEEDING  . Gabapentin     Patient cannot take over 200 mg dose at a time as it causes jerking or spasms  . Naproxen Other (See Comments)    CANNOT TAKE/NO REASON GIVEN      Past Medical History  Diagnosis Date  . Mitral valve prolapse 2003    MVR in 2003  . Obesity   . Osteoarthritis   . Hypertension   . Hyperlipidemia   . COPD (chronic obstructive pulmonary disease)   . CHF (congestive heart failure) diastolic 10/2010    CXR in 12/2010: Prior CABG, cardiomegaly, vascular redistribution, bibasilar atelectasis, small effusions  . Pneumonia   . Impaired glucose tolerance   . Atrial fibrillation, chronic   . Prostate cancer     elevated PSA  . GERD (gastroesophageal reflux disease)     + hiatal hernia  . Arteriosclerotic cardiovascular disease (ASCVD) 1994    stent to RCA; CABG-2003  . Hard of hearing   . Anemia, iron deficiency   . AAA (abdominal aortic aneurysm)     Fusiform; infrarenal; 4-4.1cm on CT in 07/2008 and 12/2010 by MRI  . Gastric ulcer 2004    2004; upper GI bleed  . Renal cysts, acquired, bilateral     Complex by MRI in 12/2010  . Adenomatous polyps 04/07/2012  . HYPERTENSION 12/17/2007    Subsequently hypotensive and all antihypertensive medication discontinued Lab  03/2012: Mild anemia with H&H-11.8/37.5, MCV-97, ferritin-224, normal CMet ex G-124, alb-3.4   . NSTEMI (non-ST elevated myocardial infarction) 01/18/2011  . Diabetes mellitus, type II 07/13/12    family denies patient is diabetic  . Macrocytosis without anemia 08/13/2012  . Acute systolic congestive heart failure 08/15/2012  EF 35-40%, per Echo  . Aortic stenosis, moderate 08/15/2012  . Pulmonary hypertension due to COPD 08/15/2012    53 mm Hg  . Tricuspid valve regurgitation 08/15/2012  . Retroperitoneal bleed  06/2012  . On home O2   . Left leg swelling 03/2013  . Clostridium difficile colitis 03/2013  . Unspecified hypothyroidism 05/11/2013    Newly diagnosed.    Past Surgical History  Procedure Laterality Date  . Knee surgery      Left  . Bladder surgery    . Femoral artery stent  12-06-10    Left SFA  . Appendectomy    . Mitral valve replacement (mvr)/coronary artery bypass grafting (cabg)  2003    stent to RCA in 1994  . Eye surgery  2007    bilateral cataracts  . Tonsillectomy      thinks they were removed while in the [employer]  . Mass excision  06/07/2011    Procedure: EXCISION MASS;  Surgeon: Fabio Bering;  Location: [facility];  Service: General;  Laterality: N/A;  excision of 2 masses back and buttocks  . Back surgery    . Esophagogastroduodenoscopy  03/03/2003    Large, deep prepyloric ulcer, as described above without bleeding/ Normal esophagus  . Esophagogastroduodenoscopy  09/03/2004    normal throughout  . Colonoscopy  09/03/2004    small ulcer, without stigmata of bleeding  . Esophagogastroduodenoscopy  11/14/2004    Normal esophagus and small hiatal hernia, otherwise normal stomach   . Colonoscopy  11/14/2004    Internal hemorrhoids, otherwise normal rectum/ left-sided diverticula , diffusely oozing right colon mucosa without a discrete lesion amenable to endoscopic therapy. 2 diminutive polyps. FELT TO HAVE AVMs/telangiectasias  . Colonoscopy  01/29/2005    Normal rectum  . Colonoscopy  03/11/2012    Colonic diverticulosis. Colonic polyps-removed as described above. Vascular anomalies in the cecum likely representing hemangiomas. Status post hemostasis clipping of  2 of the 3. ADENOMATOUS POLYPS. Repeat 2016  . Esophagogastroduodenoscopy  03/11/2012    Deformity of the antrum;  small polyp in antrum-not manipulated. Otherwise normal exam    Prior to Admission medications   Medication Sig Start Date End Date Taking? Authorizing Provider  Cholecalciferol 1000 UNITS  tablet Take 1,000 Units by mouth daily.   Yes Historical Provider, MD  cycloSPORINE (RESTASIS) 0.05 % ophthalmic emulsion Place 1 drop into both eyes 2 (two) times daily.   Yes Historical Provider, MD  digoxin (LANOXIN) 0.125 MG tablet Take 0.125 mg by mouth daily.   Yes Historical Provider, MD  Fluticasone-Salmeterol (ADVAIR) 100-50 MCG/DOSE AEPB Inhale 1 puff into the lungs 2 (two) times daily.   Yes Historical Provider, MD  furosemide (LASIX) 80 MG tablet Take 80 mg by mouth 2 (two) times daily. For 2 days start date 06/12/13   Yes Historical Provider, MD  guaiFENesin (MUCINEX) 600 MG 12 hr tablet Take 600 mg by mouth 2 (two) times daily.   Yes Historical Provider, MD  levothyroxine (SYNTHROID, LEVOTHROID) 75 MCG tablet Take 75 mcg by mouth daily before breakfast.   Yes Historical Provider, MD  lovastatin (MEVACOR) 40 MG tablet Take 40 mg by mouth every other day. Patient takes on Sunday,Monday,Wednesday,Friday   Yes Historical Provider, MD  Multiple Vitamins-Minerals (CENTRUM SILVER) tablet Take 1 tablet by mouth daily.   Yes Historical Provider, MD  omeprazole (PRILOSEC) 40 MG capsule Take 40 mg by mouth daily.   Yes Historical Provider, MD  potassium chloride SA (K-DUR,KLOR-CON) 20 MEQ tablet Take 20  mEq by mouth daily. For 2 days 06/12/13 & 06/13/13   Yes Historical Provider, MD  tamsulosin (FLOMAX) 0.4 MG CAPS capsule Take 0.4 mg by mouth at bedtime.    Yes Historical Provider, MD  albuterol (PROVENTIL) (2.5 MG/3ML) 0.083% nebulizer solution Take 2.5 mg by nebulization every 6 (six) hours as needed for wheezing or shortness of breath.    Historical Provider, MD  ipratropium (ATROVENT) 0.02 % nebulizer solution Take 0.5 mg by nebulization every 6 (six) hours as needed for wheezing or shortness of breath.    Historical Provider, MD    Social History:  reports that he quit smoking about 5 years ago. His smoking use included Cigarettes. He has a 70 pack-year smoking history. He has quit using  smokeless tobacco. He reports that he does not drink alcohol or use illicit drugs.  Family History  Problem Relation Age of Onset  . Stroke Mother   . Stroke Brother   . Heart attack Brother   . Cancer Brother   . Cancer Brother   . Anesthesia problems Neg Hx   . Hypotension Neg Hx   . Malignant hyperthermia Neg Hx   . Pseudochol deficiency Neg Hx      All the positives are listed in BOLD  Review of Systems:  HEENT: Headache, blurred vision, runny nose, sore throat Neck: Hypothyroidism, hyperthyroidism,,lymphadenopathy Chest : Shortness of breath, history of COPD, Asthma Heart : Chest pain, history of coronary arterey disease GI:  Nausea, vomiting, diarrhea, constipation, GERD GU: Dysuria, urgency, frequency of urination, hematuria Neuro: Stroke, seizures, syncope Psych: Depression, anxiety, hallucinations   Physical Exam: Blood pressure 110/71, pulse 77, temperature 97.4 F (36.3 C), temperature source Oral, resp. rate 18, SpO2 96.00%. Constitutional:   Patient is a well-developed and well-nourished male in no acute distress and cooperative with exam. Head: Normocephalic and atraumatic Mouth: Mucus membranes moist Eyes: PERRL, EOMI, conjunctivae normal Neck: Supple, No Thyromegaly Cardiovascular: Irregular rhythm, grade 3 x 6 systolic murmur auscultated in the mitral and aortic area Pulmonary/Chest: CTAB, no wheezes, rales, or rhonchi Abdominal: Soft. Non-tender, marked abdominal distention, positive fluid thrill,  bowel sounds are normal, no masses, organomegaly, or guarding present.  Neurological: A&O x3, Strenght is normal and symmetric bilaterally, cranial nerve II-XII are grossly intact, no focal motor deficit, sensory intact to light touch bilaterally.  Extremities : 2+ pitting edema in the lower extremities   Labs on Admission:  Results for orders placed during the hospital encounter of 06/12/13 (from the past 48 hour(s))  BASIC METABOLIC PANEL     Status:  Abnormal   Collection Time    06/12/13  5:41 PM      Result Value Range   Sodium 136  135 - 145 mEq/L   Potassium 4.7  3.5 - 5.1 mEq/L   Chloride 96  96 - 112 mEq/L   CO2 33 (*) 19 - 32 mEq/L   Glucose, Bld 94  70 - 99 mg/dL   BUN 36 (*) 6 - 23 mg/dL   Creatinine, Ser 9.60 (*) 0.50 - 1.35 mg/dL   Calcium 9.0  8.4 - 45.4 mg/dL   GFR calc non Af Amer 36 (*) >90 mL/min   GFR calc Af Amer 42 (*) >90 mL/min   Comment: (NOTE)     The eGFR has been calculated using the CKD EPI equation.     This calculation has not been validated in all clinical situations.     eGFR's persistently <90 mL/min signify possible Chronic Kidney  Disease.  CBC WITH DIFFERENTIAL     Status: Abnormal   Collection Time    06/12/13  5:41 PM      Result Value Range   WBC 9.7  4.0 - 10.5 K/uL   RBC 4.10 (*) 4.22 - 5.81 MIL/uL   Hemoglobin 13.3  13.0 - 17.0 g/dL   HCT 08.6  57.8 - 46.9 %   MCV 102.7 (*) 78.0 - 100.0 fL   MCH 32.4  26.0 - 34.0 pg   MCHC 31.6  30.0 - 36.0 g/dL   RDW 62.9 (*) 52.8 - 41.3 %   Platelets 139 (*) 150 - 400 K/uL   Neutrophils Relative % 80 (*) 43 - 77 %   Neutro Abs 7.8 (*) 1.7 - 7.7 K/uL   Lymphocytes Relative 9 (*) 12 - 46 %   Lymphs Abs 0.9  0.7 - 4.0 K/uL   Monocytes Relative 10  3 - 12 %   Monocytes Absolute 0.9  0.1 - 1.0 K/uL   Eosinophils Relative 1  0 - 5 %   Eosinophils Absolute 0.1  0.0 - 0.7 K/uL   Basophils Relative 0  0 - 1 %   Basophils Absolute 0.0  0.0 - 0.1 K/uL  TROPONIN I     Status: None   Collection Time    06/12/13  5:41 PM      Result Value Range   Troponin I <0.30  <0.30 ng/mL   Comment:            Due to the release kinetics of cTnI,     a negative result within the first hours     of the onset of symptoms does not rule out     myocardial infarction with certainty.     If myocardial infarction is still suspected,     repeat the test at appropriate intervals.  PRO B NATRIURETIC PEPTIDE     Status: Abnormal   Collection Time    06/12/13  5:41 PM       Result Value Range   Pro B Natriuretic peptide (BNP) 9057.0 (*) 0 - 450 pg/mL    Radiological Exams on Admission: Dg Chest Portable 1 View  06/12/2013   CLINICAL DATA:  Shortness breath, swelling  EXAM: PORTABLE CHEST - 1 VIEW  COMPARISON:  05/10/2013  FINDINGS: Cardiomegaly with mild interstitial edema. Moderate right and small left pleural effusions. Associated lower lobe opacities, likely atelectasis.  IMPRESSION: Cardiomegaly with mild interstitial edema.  Moderate right and small left pleural effusions.   Electronically Signed   By: Charline Bills M.D.   On: 06/12/2013 17:55    Assessment/Plan Principal Problem:   Anasarca Active Problems:   Chronic diastolic CHF (congestive heart failure)   Atrial fibrillation, chronic   CHF (congestive heart failure) Balanoposthitis   79 year old male with multiple medical problems now been admitted with generalized swelling, ascites, mild shortness of breath which all seems multifactorial at this time. Patient has aortic stenosis, pulmonary hypertension, COPD, combined diastolic and systolic heart failure. Patient has gained 20 pounds weight over the past one month and that despite getting Lasix. I'm concerned with malnutrition, his last albumin done on 04/14/2013 was 3.3, will repeat albumin level today. Patient will be started on Lasix 40 mg IV every 12 hours, will monitor him in telemetry as he has chronic A. fib. He is not on any anticoagulation due to easy bleeding, cardiology recommended not to put him on any anticoagulation or aspirin as per patient's daughter  at bedside.  Will check 3 sets of cardiac enzymes. Also patient appears to have ascites which may be contributed to patient's shortness of breath though it is not tense at this time. I will order ultrasound-guided paracentesis to be done by IR in the morning. Will order Lotrisone cream for inflammation around the prepuce, also patient has diaper rash we'll start him on nystatin  powder. We'll give him SCDs for DVT prophylaxis  Code status: Patient is full code, though he does not want to be put on long-term life support  Family discussion: Discussed with patient's daughter at bedside   Time Spent on Admission: 65 min   [place] [facility] Pager: [phone] 06/12/2013, 7:46 PM  If 7PM-7AM, please contact night-coverage  [email or web address]  Password [email or web address]

## 2013-06-12 NOTE — ED Notes (Signed)
Pt alert. No complaints or distress noted at present time.  Hospitalist at bedside to evaluate pt.

## 2013-06-12 NOTE — ED Provider Notes (Signed)
CSN: [id number]     Arrival date & time 06/12/13  1653 History   First MD Initiated Contact with Patient 06/12/13 1706     No chief complaint on file.  (Consider location/radiation/quality/duration/timing/severity/associated sxs/prior Treatment) HPI.... level V caveat for mild dementia.   Patient is a transfer from [facility] nursing facility.   He has increasing shortness of breath and fluid accumulation in his lower extremity. Daughter reports a weight gain of approximately 20 pounds recently.  No chest pain, fever, chills. He is not ambulatory. Daughter wants patient to be ultimately placed in hospice. Past Medical History  Diagnosis Date  . Mitral valve prolapse 2003    MVR in 2003  . Obesity   . Osteoarthritis   . Hypertension   . Hyperlipidemia   . COPD (chronic obstructive pulmonary disease)   . CHF (congestive heart failure) diastolic 10/2010    CXR in 12/2010: Prior CABG, cardiomegaly, vascular redistribution, bibasilar atelectasis, small effusions  . Pneumonia   . Impaired glucose tolerance   . Atrial fibrillation, chronic   . Prostate cancer     elevated PSA  . GERD (gastroesophageal reflux disease)     + hiatal hernia  . Arteriosclerotic cardiovascular disease (ASCVD) 1994    stent to RCA; CABG-2003  . Hard of hearing   . Anemia, iron deficiency   . AAA (abdominal aortic aneurysm)     Fusiform; infrarenal; 4-4.1cm on CT in 07/2008 and 12/2010 by MRI  . Gastric ulcer 2004    2004; upper GI bleed  . Renal cysts, acquired, bilateral     Complex by MRI in 12/2010  . Adenomatous polyps 04/07/2012  . HYPERTENSION 12/17/2007    Subsequently hypotensive and all antihypertensive medication discontinued Lab  03/2012: Mild anemia with H&H-11.8/37.5, MCV-97, ferritin-224, normal CMet ex G-124, alb-3.4   . NSTEMI (non-ST elevated myocardial infarction) 01/18/2011  . Diabetes mellitus, type II 07/13/12    family denies patient is diabetic  . Macrocytosis without anemia 08/13/2012  .  Acute systolic congestive heart failure 08/15/2012    EF 35-40%, per Echo  . Aortic stenosis, moderate 08/15/2012  . Pulmonary hypertension due to COPD 08/15/2012    53 mm Hg  . Tricuspid valve regurgitation 08/15/2012  . Retroperitoneal bleed 06/2012  . On home O2   . Left leg swelling 03/2013  . Clostridium difficile colitis 03/2013  . Unspecified hypothyroidism 05/11/2013    Newly diagnosed.   Past Surgical History  Procedure Laterality Date  . Knee surgery      Left  . Bladder surgery    . Femoral artery stent  12-06-10    Left SFA  . Appendectomy    . Mitral valve replacement (mvr)/coronary artery bypass grafting (cabg)  2003    stent to RCA in 1994  . Eye surgery  2007    bilateral cataracts  . Tonsillectomy      thinks they were removed while in the [employer]  . Mass excision  06/07/2011    Procedure: EXCISION MASS;  Surgeon: Fabio Bering;  Location: [facility];  Service: General;  Laterality: N/A;  excision of 2 masses back and buttocks  . Back surgery    . Esophagogastroduodenoscopy  03/03/2003    Large, deep prepyloric ulcer, as described above without bleeding/ Normal esophagus  . Esophagogastroduodenoscopy  09/03/2004    normal throughout  . Colonoscopy  09/03/2004    small ulcer, without stigmata of bleeding  . Esophagogastroduodenoscopy  11/14/2004    Normal esophagus and small  hiatal hernia, otherwise normal stomach   . Colonoscopy  11/14/2004    Internal hemorrhoids, otherwise normal rectum/ left-sided diverticula , diffusely oozing right colon mucosa without a discrete lesion amenable to endoscopic therapy. 2 diminutive polyps. FELT TO HAVE AVMs/telangiectasias  . Colonoscopy  01/29/2005    Normal rectum  . Colonoscopy  03/11/2012    Colonic diverticulosis. Colonic polyps-removed as described above. Vascular anomalies in the cecum likely representing hemangiomas. Status post hemostasis clipping of  2 of the 3. ADENOMATOUS POLYPS. Repeat 2016  .  Esophagogastroduodenoscopy  03/11/2012    Deformity of the antrum;  small polyp in antrum-not manipulated. Otherwise normal exam   Family History  Problem Relation Age of Onset  . Stroke Mother   . Stroke Brother   . Heart attack Brother   . Cancer Brother   . Cancer Brother   . Anesthesia problems Neg Hx   . Hypotension Neg Hx   . Malignant hyperthermia Neg Hx   . Pseudochol deficiency Neg Hx    History  Substance Use Topics  . Smoking status: Former Smoker -- 1.00 packs/day for 70 years    Types: Cigarettes    Quit date: 06/25/2007  . Smokeless tobacco: Former Neurosurgeon  . Alcohol Use: No    Review of Systems  Unable to perform ROS: Dementia    Allergies  Aspirin; Gabapentin; and Naproxen  Home Medications   Current Outpatient Rx  Name  Route  Sig  Dispense  Refill  . Cholecalciferol 1000 UNITS tablet   Oral   Take 1,000 Units by mouth daily.         . cycloSPORINE (RESTASIS) 0.05 % ophthalmic emulsion   Both Eyes   Place 1 drop into both eyes 2 (two) times daily.         . digoxin (LANOXIN) 0.125 MG tablet   Oral   Take 0.125 mg by mouth daily.         . Fluticasone-Salmeterol (ADVAIR) 100-50 MCG/DOSE AEPB   Inhalation   Inhale 1 puff into the lungs 2 (two) times daily.         . furosemide (LASIX) 80 MG tablet   Oral   Take 80 mg by mouth 2 (two) times daily. For 2 days start date 06/12/13         . guaiFENesin (MUCINEX) 600 MG 12 hr tablet   Oral   Take 600 mg by mouth 2 (two) times daily.         Marland Kitchen levothyroxine (SYNTHROID, LEVOTHROID) 75 MCG tablet   Oral   Take 75 mcg by mouth daily before breakfast.         . lovastatin (MEVACOR) 40 MG tablet   Oral   Take 40 mg by mouth every other day. Patient takes on Sunday,Monday,Wednesday,Friday         . Multiple Vitamins-Minerals (CENTRUM SILVER) tablet   Oral   Take 1 tablet by mouth daily.         Marland Kitchen omeprazole (PRILOSEC) 40 MG capsule   Oral   Take 40 mg by mouth daily.          . potassium chloride SA (K-DUR,KLOR-CON) 20 MEQ tablet   Oral   Take 20 mEq by mouth daily. For 2 days 06/12/13 & 06/13/13         . tamsulosin (FLOMAX) 0.4 MG CAPS capsule   Oral   Take 0.4 mg by mouth at bedtime.          Marland Kitchen  albuterol (PROVENTIL) (2.5 MG/3ML) 0.083% nebulizer solution   Nebulization   Take 2.5 mg by nebulization every 6 (six) hours as needed for wheezing or shortness of breath.         Marland Kitchen ipratropium (ATROVENT) 0.02 % nebulizer solution   Nebulization   Take 0.5 mg by nebulization every 6 (six) hours as needed for wheezing or shortness of breath.          BP 110/71  Pulse 77  Temp(Src) 97.4 F (36.3 C) (Oral)  Resp 18  SpO2 96% Physical Exam  Nursing note and vitals reviewed. Constitutional: He is oriented to person, place, and time.  Pale, looks ill, no frank dyspnea.  HENT:  Head: Normocephalic and atraumatic.  Eyes: Conjunctivae and EOM are normal. Pupils are equal, round, and reactive to light.  Neck: Normal range of motion. Neck supple.  Cardiovascular: Normal rate, regular rhythm and normal heart sounds.   Pulmonary/Chest: Effort normal and breath sounds normal.  Abdominal: Soft. Bowel sounds are normal.   Protuberant abdomen  Musculoskeletal: Normal range of motion.  Neurological: He is alert and oriented to person, place, and time.  Skin: Skin is warm and dry.  3+ peripheral edema  Psychiatric: He has a normal mood and affect. His behavior is normal.    ED Course  Procedures (including critical care time) Labs Review Labs Reviewed  BASIC METABOLIC PANEL - Abnormal; Notable for the following:    CO2 33 (*)    BUN 36 (*)    Creatinine, Ser 1.61 (*)    GFR calc non Af Amer 36 (*)    GFR calc Af Amer 42 (*)    All other components within normal limits  CBC WITH DIFFERENTIAL - Abnormal; Notable for the following:    RBC 4.10 (*)    MCV 102.7 (*)    RDW 16.7 (*)    Platelets 139 (*)    Neutrophils Relative % 80 (*)    Neutro Abs  7.8 (*)    Lymphocytes Relative 9 (*)    All other components within normal limits  PRO B NATRIURETIC PEPTIDE - Abnormal; Notable for the following:    Pro B Natriuretic peptide (BNP) 9057.0 (*)    All other components within normal limits  TROPONIN I   Imaging Review Dg Chest Portable 1 View  06/12/2013   CLINICAL DATA:  Shortness breath, swelling  EXAM: PORTABLE CHEST - 1 VIEW  COMPARISON:  05/10/2013  FINDINGS: Cardiomegaly with mild interstitial edema. Moderate right and small left pleural effusions. Associated lower lobe opacities, likely atelectasis.  IMPRESSION: Cardiomegaly with mild interstitial edema.  Moderate right and small left pleural effusions.   Electronically Signed   By: Charline Bills M.D.   On: 06/12/2013 17:55    EKG Interpretation    Date/Time:  Saturday June 12 2013 17:57:37 EST Ventricular Rate:  70 PR Interval:    QRS Duration: 132 QT Interval:  414 QTC Calculation: 447 R Axis:   -20 Text Interpretation:  Atrial fibrillation with premature ventricular or aberrantly conducted complexes Non-specific intra-ventricular conduction block Cannot rule out Anterior infarct (cited on or before 08-May-2013) T wave abnormality, consider lateral ischemia Abnormal ECG When compared with ECG of 08-May-2013 17:04, No significant change was found Confirmed by Taronda Comacho  MD, Shep Porter ([id number]) on 06/12/2013 7:31:54 PM            MDM   1. CHF (congestive heart failure)    Patient has multiple health problems.   He appears to  have chronic heart failure. Daughter ultimately wants hospice placement. He is not dyspneic. IV Lasix. Admit to general medicine    Donnetta Hutching, MD 06/12/13 [phone]

## 2013-06-12 NOTE — ED Notes (Signed)
Per EMS, pt here from [facility] at daughters request to be evaluated and then sent to hospice

## 2013-06-13 ENCOUNTER — Inpatient Hospital Stay (HOSPITAL_COMMUNITY): Payer: Medicare Other

## 2013-06-13 DIAGNOSIS — I5033 Acute on chronic diastolic (congestive) heart failure: Secondary | ICD-10-CM

## 2013-06-13 LAB — CBC
HCT: 42.3 % (ref 39.0–52.0)
Hemoglobin: 13.3 g/dL (ref 13.0–17.0)
MCH: 32.1 pg (ref 26.0–34.0)
MCHC: 31.4 g/dL (ref 30.0–36.0)
Platelets: 137 10*3/uL — ABNORMAL LOW (ref 150–400)
RBC: 4.14 MIL/uL — ABNORMAL LOW (ref 4.22–5.81)
WBC: 9.5 10*3/uL (ref 4.0–10.5)

## 2013-06-13 LAB — COMPREHENSIVE METABOLIC PANEL
Albumin: 3.4 g/dL — ABNORMAL LOW (ref 3.5–5.2)
Alkaline Phosphatase: 55 U/L (ref 39–117)
BUN: 34 mg/dL — ABNORMAL HIGH (ref 6–23)
CO2: 33 mEq/L — ABNORMAL HIGH (ref 19–32)
Calcium: 8.6 mg/dL (ref 8.4–10.5)
Chloride: 99 mEq/L (ref 96–112)
GFR calc Af Amer: 46 mL/min — ABNORMAL LOW (ref >90)
GFR calc non Af Amer: 40 mL/min — ABNORMAL LOW (ref >90)
Glucose, Bld: 94 mg/dL (ref 70–99)
Potassium: 4.3 mEq/L (ref 3.5–5.1)
Sodium: 140 mEq/L (ref 135–145)
Total Protein: 7.2 g/dL (ref 6.0–8.3)

## 2013-06-13 LAB — URINALYSIS W MICROSCOPIC + REFLEX CULTURE
Bilirubin Urine: NEGATIVE
Glucose, UA: NEGATIVE mg/dL
Ketones, ur: NEGATIVE mg/dL
Urobilinogen, UA: 0.2 mg/dL (ref 0.0–1.0)

## 2013-06-13 MED ORDER — FUROSEMIDE 10 MG/ML IJ SOLN
10.0000 mg/h | INTRAVENOUS | Status: AC
  Administered 2013-06-13: 10 mg/h via INTRAVENOUS
  Filled 2013-06-13: qty 25

## 2013-06-13 MED ORDER — HEPARIN SODIUM (PORCINE) 5000 UNIT/ML IJ SOLN
5000.0000 [IU] | Freq: Three times a day (TID) | INTRAMUSCULAR | Status: DC
  Administered 2013-06-13 – 2013-06-17 (×12): 5000 [IU] via SUBCUTANEOUS
  Filled 2013-06-13 (×15): qty 1

## 2013-06-13 MED ORDER — MAGNESIUM SULFATE IN D5W 10-5 MG/ML-% IV SOLN
1.0000 g | Freq: Once | INTRAVENOUS | Status: AC
  Administered 2013-06-13: 1 g via INTRAVENOUS
  Filled 2013-06-13: qty 100

## 2013-06-13 MED ORDER — MAGNESIUM SULFATE IN D5W 10-5 MG/ML-% IV SOLN
INTRAVENOUS | Status: AC
  Filled 2013-06-13: qty 100

## 2013-06-13 MED ORDER — METOPROLOL TARTRATE 25 MG PO TABS
12.5000 mg | ORAL_TABLET | Freq: Two times a day (BID) | ORAL | Status: DC
  Administered 2013-06-13: 12.5 mg via ORAL
  Filled 2013-06-13: qty 1

## 2013-06-13 MED ORDER — LIDOCAINE 5 % EX OINT
TOPICAL_OINTMENT | Freq: Three times a day (TID) | CUTANEOUS | Status: DC | PRN
  Administered 2013-06-14: 1 via TOPICAL
  Administered 2013-06-14 – 2013-06-17 (×4): via TOPICAL
  Filled 2013-06-13: qty 35.44

## 2013-06-13 MED ORDER — FUROSEMIDE 10 MG/ML IJ SOLN
INTRAMUSCULAR | Status: AC
  Filled 2013-06-13: qty 30

## 2013-06-13 MED ORDER — METOPROLOL TARTRATE 25 MG PO TABS
25.0000 mg | ORAL_TABLET | Freq: Two times a day (BID) | ORAL | Status: DC
  Administered 2013-06-13 – 2013-06-14 (×2): 25 mg via ORAL
  Filled 2013-06-13 (×2): qty 1

## 2013-06-13 MED ORDER — LISINOPRIL 5 MG PO TABS: 2.5000 mg | ORAL_TABLET | Freq: Every day | ORAL | Status: DC

## 2013-06-13 MED ORDER — CHLORHEXIDINE GLUCONATE CLOTH 2 % EX PADS
6.0000 | MEDICATED_PAD | Freq: Every day | CUTANEOUS | Status: AC
  Administered 2013-06-13 – 2013-06-17 (×4): 6 via TOPICAL

## 2013-06-13 MED ORDER — MUPIROCIN 2 % EX OINT
1.0000 "application " | TOPICAL_OINTMENT | Freq: Two times a day (BID) | CUTANEOUS | Status: AC
  Administered 2013-06-13 – 2013-06-17 (×10): 1 via NASAL
  Filled 2013-06-13 (×3): qty 22

## 2013-06-13 NOTE — Progress Notes (Signed)
Lasix d/c'd as prescribed.  Per Dr. Jerre Simon states that the patient should have the penile drainage regular cultured because he does not suspect chlamydia, but thinks the infection is related to the foley catheter.  I vervbalized understanding and will await orders.

## 2013-06-13 NOTE — Progress Notes (Signed)
Placed on NRB mask for low SATs may have to place on BiPAP till his CHF ends.

## 2013-06-13 NOTE — Progress Notes (Addendum)
[facility]                                                                                Patient Demographics  [name], is a 79 y.o. male, DOB - [birth year], ZOX:[id number]  Admit date - 06/12/2013   Admitting Physician Leonard Ide, MD  Outpatient Primary MD for the patient is Leonard Overman, MD  LOS - 1   No chief complaint on file.       Assessment & Plan    1. Anasarca, with pulmonary edema and pleural effusion along with ? ascites - this is likely secondary to underlying acute on chronic combined systolic and diastolic CHF. EF on recent echo is about 35% with LVH. - He will be placed on IV Lasix drip, will place him on low-dose beta blocker if tolerated by blood pressure and kidney function, fluid and salt restriction, monitor BMP closely. Liver function appears stable, will check UA to rule out proteinuria. Oxygen supplementation and nebulizer treatments as needed. We'll place on ACE inhibitor once renal function is stable.     2. Possible ascites. Ultrasound guided paracentesis ordered upon admission we'll follow results.     3. Moderate underlying aortic stenosis. No acute issues monitor clinically, outpatient followup with cardiology post discharge.      4. Chronic atrial fibrillation. On digoxin to be continued, goal will be rate control, not on anticoagulation or aspirin. Will defer to primary cardiologist.     5. Diaper rash due to incontinence along with inflammation around the prepuce. Agree with nystatin powder, Lotrisone cream, for now Foley catheter to prevent further skin breakdown.     6. Hypothyroidism. Continue home dose Synthroid check TSH level.    7. ARF. Due to CHF decompensation. Proven with diuresis     Code Status: Full  Family Communication:    Disposition Plan: SNF   Procedures     Consults     Medications  Scheduled Meds: . Chlorhexidine Gluconate Cloth  6 each Topical Q0600  .  clotrimazole-betamethasone   Topical BID  . cycloSPORINE  1 drop Both Eyes BID  . digoxin  0.125 mg Oral Daily  . guaiFENesin  600 mg Oral BID  . levothyroxine  75 mcg Oral QAC breakfast  . mometasone-formoterol  2 puff Inhalation BID  . mupirocin ointment  1 application Nasal BID  . nystatin   Topical BID  . pantoprazole  40 mg Oral Daily  . simvastatin  20 mg Oral q1800  . sodium chloride  3 mL Intravenous Q12H  . tamsulosin  0.4 mg Oral QHS   Continuous Infusions: . furosemide (LASIX) infusion 10 mg/hr (06/13/13 8119)   PRN Meds:.sodium chloride, albuterol, HYDROcodone-acetaminophen, ipratropium, sodium chloride  DVT Prophylaxis  heparin  Lab Results  Component Value Date   PLT 137* 06/13/2013    Antibiotics     Anti-infectives   None          Subjective:   [name] today has, No headache, No chest pain, No abdominal pain - No Nausea, No new weakness tingling or numbness, No Cough - of shortness of breath  Objective:   Filed Vitals:   06/12/13 2054 06/13/13  0152 06/13/13 0559 06/13/13 0734  BP: 120/73  103/64   Pulse: 73 76 81   Temp:   97.7 F (36.5 C)   TempSrc:      Resp: 20 20 20    Height: 6\' 2"  (1.88 m)     Weight: 113.6 kg (250 lb 7.1 oz)     SpO2: 90% 90% 98% 94%    Wt Readings from Last 3 Encounters:  06/12/13 113.6 kg (250 lb 7.1 oz)  05/13/13 79.2 kg (174 lb 9.7 oz)  04/13/13 103.7 kg (228 lb 9.9 oz)    No intake or output data in the 24 hours ending 06/13/13 0932  Exam Awake Alert, Oriented X 3, No new F.N deficits, Normal affect [name].AT,PERRAL Supple Neck,No JVD, No cervical lymphadenopathy appriciated.  Symmetrical Chest wall movement, Good air movement bilaterally, basilar rales RRR,No Gallops,Rubs or new Murmurs, No Parasternal Heave +ve B.Sounds, Abd Soft mildly distended, Non tender, No organomegaly appriciated, No rebound - guarding or rigidity. No Cyanosis, Clubbing , 2+ edema, No new Rash or bruise   Has some scrotal  and penile edema, mild inflammation around the prepuce   Data Review   Micro Results Recent Results (from the past 240 hour(s))  MRSA PCR SCREENING     Status: Abnormal   Collection Time    06/13/13  1:52 AM      Result Value Range Status   MRSA by PCR POSITIVE (*) NEGATIVE Final   Comment:            The GeneXpert MRSA Assay (FDA     approved for NASAL specimens     only), is one component of a     comprehensive MRSA colonization     surveillance program. It is not     intended to diagnose MRSA     infection nor to guide or     monitor treatment for     MRSA infections.     RESULT CALLED TO, READ BACK BY AND VERIFIED WITH:      [name] @ 0425 ON 06/13/13 BY [facility]    Radiology Reports Dg Chest Portable 1 View  06/12/2013   CLINICAL DATA:  Shortness breath, swelling  EXAM: PORTABLE CHEST - 1 VIEW  COMPARISON:  05/10/2013  FINDINGS: Cardiomegaly with mild interstitial edema. Moderate right and small left pleural effusions. Associated lower lobe opacities, likely atelectasis.  IMPRESSION: Cardiomegaly with mild interstitial edema.  Moderate right and small left pleural effusions.   Electronically Signed   By: Charline Bills M.D.   On: 06/12/2013 17:55    CBC  Recent Labs Lab 06/12/13 1741 06/13/13 0239  WBC 9.7 9.5  HGB 13.3 13.3  HCT 42.1 42.3  PLT 139* 137*  MCV 102.7* 102.2*  MCH 32.4 32.1  MCHC 31.6 31.4  RDW 16.7* 16.6*  LYMPHSABS 0.9  --   MONOABS 0.9  --   EOSABS 0.1  --   BASOSABS 0.0  --     Chemistries   Recent Labs Lab 06/12/13 1741 06/13/13 0239  NA 136 140  K 4.7 4.3  CL 96 99  CO2 33* 33*  GLUCOSE 94 94  BUN 36* 34*  CREATININE 1.61* 1.49*  CALCIUM 9.0 8.6  AST 25 23  ALT 11 10  ALKPHOS 62 55  BILITOT 0.9 1.1   ------------------------------------------------------------------------------------------------------------------ estimated creatinine clearance is 45.1 ml/min (by C-G formula based on Cr of  1.49). ------------------------------------------------------------------------------------------------------------------ No results found for this basename: HGBA1C,  in the last 72 hours ------------------------------------------------------------------------------------------------------------------  No results found for this basename: CHOL, HDL, LDLCALC, TRIG, CHOLHDL, LDLDIRECT,  in the last 72 hours ------------------------------------------------------------------------------------------------------------------ No results found for this basename: TSH, T4TOTAL, FREET3, T3FREE, THYROIDAB,  in the last 72 hours ------------------------------------------------------------------------------------------------------------------ No results found for this basename: VITAMINB12, FOLATE, FERRITIN, TIBC, IRON, RETICCTPCT,  in the last 72 hours  Coagulation profile No results found for this basename: INR, PROTIME,  in the last 168 hours  No results found for this basename: DDIMER,  in the last 72 hours  Cardiac Enzymes  Recent Labs Lab 06/12/13 2122 06/13/13 0239 06/13/13 0858  TROPONINI <0.30 <0.30 <0.30   ------------------------------------------------------------------------------------------------------------------ No components found with this basename: POCBNP,      Time Spent in minutes   35   Susa Raring K M.D on 06/13/2013 at 9:32 AM  Between 7am to 7pm - Pager - [phone]  After 7pm go to [email or web address] - password [email or web address]  And look for the night coverage person covering for me after hours  [facility] Office  [phone]

## 2013-06-13 NOTE — Progress Notes (Signed)
PT had pulled his mask off SATS  Decreased down to low 80's placed back on 4lpm/[place] SATS increased to 92,.

## 2013-06-13 NOTE — Consult Note (Signed)
Note #[id number]

## 2013-06-13 NOTE — Progress Notes (Signed)
Dr Jerre Simon. Nurses are not allowed to collect GC/ Clymydia probe. Probe is at bedside if you would like to collect it.

## 2013-06-13 NOTE — Progress Notes (Signed)
Notified Dr. Thedore Mins of the patient via text having occasional V. Tach that looks like PVC's,  Penile yellow thick drainage, and the patient request for lidocaine cream due burning at the penile head site.  New orders given and followed.

## 2013-06-14 ENCOUNTER — Encounter (HOSPITAL_COMMUNITY): Payer: Self-pay

## 2013-06-14 ENCOUNTER — Inpatient Hospital Stay (HOSPITAL_COMMUNITY): Payer: Medicare Other

## 2013-06-14 DIAGNOSIS — I5023 Acute on chronic systolic (congestive) heart failure: Secondary | ICD-10-CM

## 2013-06-14 DIAGNOSIS — I498 Other specified cardiac arrhythmias: Secondary | ICD-10-CM

## 2013-06-14 DIAGNOSIS — I719 Aortic aneurysm of unspecified site, without rupture: Secondary | ICD-10-CM

## 2013-06-14 DIAGNOSIS — I359 Nonrheumatic aortic valve disorder, unspecified: Secondary | ICD-10-CM

## 2013-06-14 LAB — RPR: RPR Ser Ql: NONREACTIVE

## 2013-06-14 LAB — BASIC METABOLIC PANEL
BUN: 35 mg/dL — ABNORMAL HIGH (ref 6–23)
CO2: 32 mEq/L (ref 19–32)
Calcium: 8.4 mg/dL (ref 8.4–10.5)
Chloride: 96 mEq/L (ref 96–112)
Creatinine, Ser: 1.45 mg/dL — ABNORMAL HIGH (ref 0.50–1.35)
GFR calc Af Amer: 48 mL/min — ABNORMAL LOW (ref >90)
Glucose, Bld: 97 mg/dL (ref 70–99)

## 2013-06-14 LAB — TSH: TSH: 26.81 u[IU]/mL — ABNORMAL HIGH (ref 0.350–4.500)

## 2013-06-14 LAB — PSA: PSA: 1.31 ng/mL (ref <=4.00)

## 2013-06-14 MED ORDER — FUROSEMIDE 10 MG/ML IJ SOLN
80.0000 mg | Freq: Two times a day (BID) | INTRAMUSCULAR | Status: DC
  Administered 2013-06-14: 80 mg via INTRAVENOUS
  Filled 2013-06-14: qty 8

## 2013-06-14 MED ORDER — LEVOTHYROXINE SODIUM 100 MCG PO TABS
100.0000 ug | ORAL_TABLET | Freq: Every day | ORAL | Status: DC
  Administered 2013-06-15 – 2013-06-19 (×5): 100 ug via ORAL
  Filled 2013-06-14 (×5): qty 1

## 2013-06-14 MED ORDER — DEXTROSE 5 % IV SOLN
1.0000 g | INTRAVENOUS | Status: AC
  Administered 2013-06-14 – 2013-06-18 (×5): 1 g via INTRAVENOUS
  Filled 2013-06-14 (×7): qty 10

## 2013-06-14 MED ORDER — DEXTROSE 5 % IV SOLN
4.0000 mg/h | INTRAVENOUS | Status: DC
  Administered 2013-06-14: 8 mg/h via INTRAVENOUS
  Administered 2013-06-16 (×2): 4 mg/h via INTRAVENOUS
  Filled 2013-06-14 (×3): qty 25

## 2013-06-14 NOTE — Progress Notes (Signed)
Paracentesis complete no signs of distress. 2300 ml yellow abdominal fluid removed.

## 2013-06-14 NOTE — Progress Notes (Signed)
[facility]                                                                                Patient Demographics  [name], is a 79 y.o. male, DOB - 04/06/1922, FAO:[id number]  Admit date - 06/12/2013   Admitting Physician Meredeth Ide, MD  Outpatient Primary MD for the patient is [name], MD  LOS - 2   No chief complaint on file.       Assessment & Plan    1. Anasarca, with pulmonary edema and pleural effusion along with ? ascites - this is likely secondary to underlying acute on chronic combined systolic and diastolic CHF. EF on recent echo is about 35% with LVH. - Improving with IV Lasix , have placed him on low-dose beta blocker continue fluid and salt restriction, monitor BMP closely. Liver function appears stable, UA has tracet proteinuria only. Oxygen supplementation and nebulizer treatments as needed. We'll place on ACE inhibitor once renal function is stable. Improved clinically.     2. Possible ascites (clinically). Ultrasound guided paracentesis ordered upon admission we'll follow results.     3. Moderate underlying aortic stenosis. No acute issues monitor clinically, outpatient followup with cardiology post discharge.      4. Chronic atrial fibrillation. On digoxin and Low dose B blocker, goal will be rate control, not on anticoagulation or aspirin. Will defer to primary cardiologist.     5. Diaper rash due to incontinence along with inflammation around the prepuce. Agree with nystatin powder, Lotrisone cream, for now Foley catheter to prevent further skin breakdown. Urology following, some urethral discharge, she was sent for [facility] and chlamydia.     6. Hypothyroidism. Continue home dose Synthroid check TSH level.  Lab Results  Component Value Date   TSH 33.433* 05/11/2013      7. ARF. Due to CHF decompensation. Improving with diuresis.     Code Status: Full  Family Communication:    Disposition Plan: SNF   Procedures      Consults     Medications  Scheduled Meds: . Chlorhexidine Gluconate Cloth  6 each Topical Q0600  . clotrimazole-betamethasone   Topical BID  . cycloSPORINE  1 drop Both Eyes BID  . digoxin  0.125 mg Oral Daily  . furosemide  80 mg Intravenous BID  . guaiFENesin  600 mg Oral BID  . heparin subcutaneous  5,000 Units Subcutaneous Q8H  . levothyroxine  75 mcg Oral QAC breakfast  . metoprolol tartrate  25 mg Oral BID  . mometasone-formoterol  2 puff Inhalation BID  . mupirocin ointment  1 application Nasal BID  . nystatin   Topical BID  . pantoprazole  40 mg Oral Daily  . simvastatin  20 mg Oral q1800  . sodium chloride  3 mL Intravenous Q12H  . tamsulosin  0.4 mg Oral QHS   Continuous Infusions:   PRN Meds:.sodium chloride, albuterol, HYDROcodone-acetaminophen, ipratropium, lidocaine, sodium chloride  DVT Prophylaxis  heparin  Lab Results  Component Value Date   PLT 137* 06/13/2013    Antibiotics     Anti-infectives   None          Subjective:   Leonard Grumbles  [name] today has, No headache, No chest pain, No abdominal pain - No Nausea, No new weakness tingling or numbness, No Cough - of shortness of breath  Objective:   Filed Vitals:   06/13/13 2101 06/13/13 2211 06/14/13 0455 06/14/13 0808  BP:  103/65 111/61   Pulse:  69 73   Temp:  98.4 F (36.9 C) 97.8 F (36.6 C)   TempSrc:      Resp:  18 18   Height:      Weight:      SpO2: 93% 90% 90% 92%    Wt Readings from Last 3 Encounters:  06/12/13 113.6 kg (250 lb 7.1 oz)  05/13/13 79.2 kg (174 lb 9.7 oz)  04/13/13 103.7 kg (228 lb 9.9 oz)     Intake/Output Summary (Last 24 hours) at 06/14/13 0839 Last data filed at 06/13/13 1100  Gross per 24 hour  Intake    240 ml  Output      0 ml  Net    240 ml    Exam Awake Alert, Oriented X 3, No new F.N deficits, Normal affect [place].AT,PERRAL Supple Neck,No JVD, No cervical lymphadenopathy appriciated.  Symmetrical Chest wall movement, Good air movement  bilaterally, basilar rales RRR,No Gallops,Rubs or new Murmurs, No Parasternal Heave +ve B.Sounds, Abd Soft mildly distended, Non tender, No organomegaly appriciated, No rebound - guarding or rigidity. No Cyanosis, Clubbing , 2+ edema, No new Rash or bruise   Has some scrotal and penile edema, mild inflammation around the prepuce   Data Review   Micro Results Recent Results (from the past 240 hour(s))  MRSA PCR SCREENING     Status: Abnormal   Collection Time    06/13/13  1:52 AM      Result Value Range Status   MRSA by PCR POSITIVE (*) NEGATIVE Final   Comment:            The GeneXpert MRSA Assay (FDA     approved for NASAL specimens     only), is one component of a     comprehensive MRSA colonization     surveillance program. It is not     intended to diagnose MRSA     infection nor to guide or     monitor treatment for     MRSA infections.     RESULT CALLED TO, READ BACK BY AND VERIFIED WITH:      WALKER,L @ 0425 ON 06/13/13 BY [facility]    Radiology Reports Dg Chest Portable 1 View  06/12/2013   CLINICAL DATA:  Shortness breath, swelling  EXAM: PORTABLE CHEST - 1 VIEW  COMPARISON:  05/10/2013  FINDINGS: Cardiomegaly with mild interstitial edema. Moderate right and small left pleural effusions. Associated lower lobe opacities, likely atelectasis.  IMPRESSION: Cardiomegaly with mild interstitial edema.  Moderate right and small left pleural effusions.   Electronically Signed   By: Charline Bills M.D.   On: 06/12/2013 17:55    CBC  Recent Labs Lab 06/12/13 1741 06/13/13 0239  WBC 9.7 9.5  HGB 13.3 13.3  HCT 42.1 42.3  PLT 139* 137*  MCV 102.7* 102.2*  MCH 32.4 32.1  MCHC 31.6 31.4  RDW 16.7* 16.6*  LYMPHSABS 0.9  --   MONOABS 0.9  --   EOSABS 0.1  --   BASOSABS 0.0  --     Chemistries   Recent Labs Lab 06/12/13 1741 06/13/13 0239 06/14/13 0541  NA 136 140 138  K 4.7 4.3 4.2  CL 96  99 96  CO2 33* 33* 32  GLUCOSE 94 94 97  BUN 36* 34* 35*   CREATININE 1.61* 1.49* 1.45*  CALCIUM 9.0 8.6 8.4  MG  --   --  2.2  AST 25 23  --   ALT 11 10  --   ALKPHOS 62 55  --   BILITOT 0.9 1.1  --    ------------------------------------------------------------------------------------------------------------------ estimated creatinine clearance is 46.3 ml/min (by C-G formula based on Cr of 1.45). ------------------------------------------------------------------------------------------------------------------ No results found for this basename: HGBA1C,  in the last 72 hours ------------------------------------------------------------------------------------------------------------------ No results found for this basename: CHOL, HDL, LDLCALC, TRIG, CHOLHDL, LDLDIRECT,  in the last 72 hours ------------------------------------------------------------------------------------------------------------------ No results found for this basename: TSH, T4TOTAL, FREET3, T3FREE, THYROIDAB,  in the last 72 hours ------------------------------------------------------------------------------------------------------------------ No results found for this basename: VITAMINB12, FOLATE, FERRITIN, TIBC, IRON, RETICCTPCT,  in the last 72 hours  Coagulation profile No results found for this basename: INR, PROTIME,  in the last 168 hours  No results found for this basename: DDIMER,  in the last 72 hours  Cardiac Enzymes  Recent Labs Lab 06/12/13 2122 06/13/13 0239 06/13/13 0858  TROPONINI <0.30 <0.30 <0.30   ------------------------------------------------------------------------------------------------------------------ No components found with this basename: POCBNP,      Time Spent in minutes   35   Susa Raring K M.D on 06/14/2013 at 8:39 AM  Between 7am to 7pm - Pager - [phone]  After 7pm go to [email or web address] - password [email or web address]  And look for the night coverage person covering for me after hours  [facility] Office   [phone]

## 2013-06-14 NOTE — Progress Notes (Signed)
UR chart review completed.  

## 2013-06-14 NOTE — Progress Notes (Signed)
Called Dr. Leta Speller to ask if he wanted to start the patient on abx due penile drainage and +UTI.  New orders given and followed.

## 2013-06-14 NOTE — Care Management Note (Addendum)
    Page 1 of 2   06/18/2013     3:52:50 PM   CARE MANAGEMENT NOTE 06/18/2013  Patient:  [name], [name]   Account Number:  [id number]  Date Initiated:  06/14/2013  Documentation initiated by:  Sharrie Rothman  Subjective/Objective Assessment:   Pt admitted from [facility] with anasarca. Pts daughter wants to take pt home at discharge. Pts daughter states that she has home O2, hospital bed, BSC, 2 w/c and bathroom is hanicapp accessible. She would like HH at discharge.     Action/Plan:   Daughter chose [facility] for RN and aide. CM will provide private duty sitter list. Alroy Bailiff of [facility] is aware and will collect the pts information from the chart. Will continue to follow for discharge planning needs.   Anticipated DC Date:  06/16/2013   Anticipated DC Plan:  HOME W HOME HEALTH SERVICES  In-house referral  Clinical Social Worker      DC Planning Services  CM consult      [facility] Choice  HOME HEALTH   Choice offered to / List presented to:  C-4 Adult Children        HH arranged  HH-1 RN  HH-4 NURSE'S AIDE  HH-2 PT  HH-6 SOCIAL WORKER      HH agency  [facility]   Status of service:  Completed, signed off Medicare Important Message given?  YES (If response is "NO", the following Medicare IM given date fields will be blank) Date Medicare IM given:  06/18/2013 Date Additional Medicare IM given:    Discharge Disposition:  HOME W HOME HEALTH SERVICES  Per UR Regulation:    If discussed at Long Length of Stay Meetings, dates discussed:    Comments:  06/18/13 1550 Arlyss Queen, RN BSN CM Pts daughter refuses Hospice at this time as ordered by MD. Daughter states that she not making the pt a DNR and if he gets into respiratory trouble at home she will be bringing pt back to hospital. [facility] arranged with [facility] per request and services will start within 48 hours of discharge. [facility] aware of referral. Weekend staff to call [facility] at discharge. Pt has all DME needed in the home.  Daughter is aware that if she changes her mind about hospice to call PCP for referral. Pts daughter and pts nurse aware of discharge arrangements.  06/14/13 1215 Arlyss Queen, RN BSN CM

## 2013-06-14 NOTE — Progress Notes (Signed)
Notified Dr. Thedore Mins that the patient has been having bradycardia lowest in the HR 30's range, then back to 60.  He is drowsy.  PRN Vicodin has been given along with his medication.  BP current 92/56 and HR at that time was 49.  Orders given for the patient to see cardiology.

## 2013-06-14 NOTE — Consult Note (Signed)
NAME:  HAIRO, GARRAWAY NO.:  [id number]  MEDICAL RECORD NO.:  [id number]  LOCATION:  A305                          FACILITY:  [facility]  PHYSICIAN:  Ky Barban, M.D.DATE OF BIRTH:  Nov 30, 1921  DATE OF CONSULTATION: DATE OF DISCHARGE:                                CONSULTATION   HISTORY OF PRESENT ILLNESS:  Mr. Hermann is an 79 year old gentleman who appears to be cooperative not in any acute distress, fully conscious, alert, oriented.  He has multiple medical problems.  He has a history of having mitral valve prolapse, obesity, osteoarthritis, hypertension, hyperlipidemia, COPD, congestive heart failure, pneumonia, impaired glucose tolerance, atrial fibrillation, chronic prostate cancer which was diagnosed few years ago.  I was able to talk to his daughter.  She provided me most of the history that he has no voiding difficulty.  No hematuria or fever or chills.  He was brought here.  He has a Foley catheter.  There is purulent discharge along the Foley catheter.  The reason I was called in was he needs to have a Foley catheter because he is on diuresis.  We are going to culture his urethral drainage.  Not on any antibiotics.  He is at home on oxygen.  History of C. diff in October 2014.  He had a mass on the back and buttock removed by Dr. Leticia Penna on June 07, 2011, it was a benign mass.  PHYSICAL EXAMINATION:  GENERAL:  Moderately built obese male not in any acute distress, fully conscious, alert, oriented. VITAL SIGNS:  Blood pressure is 110/71, pulse is 77, temperature is 97.4. ABDOMEN:  Soft, flat.  Liver, spleen, kidneys are not palpable.  Foley catheter is in place.  Has purulent and reddish drainage coming along the catheter. RECTAL:  Deferred. EXTREMITIES:  Normal.  IMPRESSION:  History of prostate cancer.  PLAN:  We will get PSA.  We will follow.     Ky Barban, M.D.     MIJ/MEDQ  D:  06/13/2013  T:  06/14/2013  Job:   [id number]

## 2013-06-14 NOTE — Procedures (Signed)
PreOperative Dx: Ascites, question cirrhosis Postoperative Dx: Ascites, question cirrhosis Procedure:   US guided paracentesis Radiologist:  Tyron Russell Anesthesia:  8 ml of 1% lidocaine Specimen:  2300 ml of clear yellow ascitic fluid EBL:   < 1 ml Complications: None

## 2013-06-14 NOTE — Consult Note (Addendum)
CARDIOLOGY CONSULT NOTE  Patient ID: KEYTON BHAT MRN: [id number] DOB/AGE: 1922-04-10 79 y.o.  Admit date: 06/12/2013 Primary Physician [name], MD  Reason for Consultation: bradycardia, chf, aortic stenosis, atrial fibrillation  HPI: The patient is an 79 year old male admitted with acute on chronic systolic heart failure. He also has a history of coronary artery disease status post coronary artery bypass graft surgery as well as mitral valve replacement, permanent atrial fibrillation, and moderate aortic stenosis. He's on digoxin for rate control, and metoprolol was recently added to his medication regimen. He had been on carvedilol in the past. He has a h/o a large spontaneous retroperitoneal hematoma and is not on anticoagulation. He recently underwent an ultrasound-guided paracentesis with removal of 2300 cc of fluid. Earlier today his heart rate was noted to drop into the 30s and then increase to the 60 beat per minute range. He was also given a pain medication (Vicodin) and the patient was noted to be drowsy. For this reason cardiology was consulted. His most recent echocardiogram was in 07/2012 which revealed an EF of 35-40%. An ECG from today shows atrial fibrillation with PVC's, nonspecific IVCD, HR 70 bpm. His daughter was not present during my interview. He is somewhat difficult to understand, but he appears to deny chest pain and shortness of breath. He has hypothyroidism (diagnosed last month) and his TSH is 26.8, and takes 75 mcg levothyroxine daily. In spite of being on 80 mg IV Lasix bid, he has a net positive fluid gain over the past two days.    Allergies  Allergen Reactions  . Aspirin Other (See Comments)    CANNOT TAKE DE TO BLEEDING  . Gabapentin     Patient cannot take over 200 mg dose at a time as it causes jerking or spasms  . Naproxen Other (See Comments)    CANNOT TAKE/NO REASON GIVEN    Current Facility-Administered Medications    Medication Dose Route Frequency Provider Last Rate Last Dose  . 0.9 %  sodium chloride infusion  250 mL Intravenous PRN Meredeth Ide, MD      . albuterol (PROVENTIL) (5 MG/ML) 0.5% nebulizer solution 2.5 mg  2.5 mg Nebulization Q6H PRN Meredeth Ide, MD   2.5 mg at 06/13/13 2059  . Chlorhexidine Gluconate Cloth 2 % PADS 6 each  6 each Topical Q0600 Meredeth Ide, MD   6 each at 06/14/13 [phone]  . clotrimazole-betamethasone (LOTRISONE) cream   Topical BID Meredeth Ide, MD      . cycloSPORINE (RESTASIS) 0.05 % ophthalmic emulsion 1 drop  1 drop Both Eyes BID Meredeth Ide, MD   1 drop at 06/14/13 1128  . digoxin (LANOXIN) tablet 0.125 mg  0.125 mg Oral Daily Meredeth Ide, MD   0.125 mg at 06/14/13 1128  . furosemide (LASIX) injection 80 mg  80 mg Intravenous BID Leroy Sea, MD   80 mg at 06/14/13 1126  . guaiFENesin (MUCINEX) 12 hr tablet 600 mg  600 mg Oral BID Meredeth Ide, MD   600 mg at 06/14/13 1128  . heparin injection 5,000 Units  5,000 Units Subcutaneous Q8H Leroy Sea, MD   5,000 Units at 06/13/13 2144  . HYDROcodone-acetaminophen (NORCO/VICODIN) 5-325 MG per tablet 1-2 tablet  1-2 tablet Oral Q4H PRN Meredeth Ide, MD   1 tablet at 06/14/13 1126  . ipratropium (ATROVENT) nebulizer solution 0.5 mg  0.5 mg Nebulization Q6H PRN Meredeth Ide,  MD   0.5 mg at 06/13/13 2059  . levothyroxine (SYNTHROID, LEVOTHROID) tablet 75 mcg  75 mcg Oral QAC breakfast Meredeth Ide, MD   75 mcg at 06/14/13 1128  . lidocaine (XYLOCAINE) 5 % ointment   Topical TID PRN Leroy Sea, MD   1 application at 06/14/13 1127  . metoprolol tartrate (LOPRESSOR) tablet 25 mg  25 mg Oral BID Leroy Sea, MD   25 mg at 06/14/13 1128  . mometasone-formoterol (DULERA) 100-5 MCG/ACT inhaler 2 puff  2 puff Inhalation BID Meredeth Ide, MD   2 puff at 06/14/13 0807  . mupirocin ointment (BACTROBAN) 2 % 1 application  1 application Nasal BID Meredeth Ide, MD   1 application at 06/14/13 1127  . nystatin  (MYCOSTATIN/NYSTOP) topical powder   Topical BID Meredeth Ide, MD   1 Bottle at 06/14/13 1127  . pantoprazole (PROTONIX) EC tablet 40 mg  40 mg Oral Daily Meredeth Ide, MD   40 mg at 06/14/13 1128  . simvastatin (ZOCOR) tablet 20 mg  20 mg Oral q1800 Meredeth Ide, MD   20 mg at 06/13/13 1815  . sodium chloride 0.9 % injection 3 mL  3 mL Intravenous Q12H Meredeth Ide, MD   3 mL at 06/13/13 2200  . sodium chloride 0.9 % injection 3 mL  3 mL Intravenous PRN Meredeth Ide, MD      . tamsulosin (FLOMAX) capsule 0.4 mg  0.4 mg Oral QHS Meredeth Ide, MD   0.4 mg at 06/13/13 2144    Past Medical History  Diagnosis Date  . Mitral valve prolapse 2003    MVR in 2003  . Obesity   . Osteoarthritis   . Hypertension   . Hyperlipidemia   . COPD (chronic obstructive pulmonary disease)   . CHF (congestive heart failure) diastolic 10/2010    CXR in 12/2010: Prior CABG, cardiomegaly, vascular redistribution, bibasilar atelectasis, small effusions  . Pneumonia   . Impaired glucose tolerance   . Atrial fibrillation, chronic   . Prostate cancer     elevated PSA  . GERD (gastroesophageal reflux disease)     + hiatal hernia  . Arteriosclerotic cardiovascular disease (ASCVD) 1994    stent to RCA; CABG-2003  . Hard of hearing   . Anemia, iron deficiency   . AAA (abdominal aortic aneurysm)     Fusiform; infrarenal; 4-4.1cm on CT in 07/2008 and 12/2010 by MRI  . Gastric ulcer 2004    2004; upper GI bleed  . Renal cysts, acquired, bilateral     Complex by MRI in 12/2010  . Adenomatous polyps 04/07/2012  . HYPERTENSION 12/17/2007    Subsequently hypotensive and all antihypertensive medication discontinued Lab  03/2012: Mild anemia with H&H-11.8/37.5, MCV-97, ferritin-224, normal CMet ex G-124, alb-3.4   . NSTEMI (non-ST elevated myocardial infarction) 01/18/2011  . Diabetes mellitus, type II 07/13/12    family denies patient is diabetic  . Macrocytosis without anemia 08/13/2012  . Acute systolic congestive heart  failure 08/15/2012    EF 35-40%, per Echo  . Aortic stenosis, moderate 08/15/2012  . Pulmonary hypertension due to COPD 08/15/2012    53 mm Hg  . Tricuspid valve regurgitation 08/15/2012  . Retroperitoneal bleed 06/2012  . On home O2   . Left leg swelling 03/2013  . Clostridium difficile colitis 03/2013  . Unspecified hypothyroidism 05/11/2013    Newly diagnosed.    Past Surgical History  Procedure Laterality Date  .  Knee surgery      Left  . Bladder surgery    . Femoral artery stent  12-06-10    Left SFA  . Appendectomy    . Mitral valve replacement (mvr)/coronary artery bypass grafting (cabg)  2003    stent to RCA in 1994  . Eye surgery  2007    bilateral cataracts  . Tonsillectomy      thinks they were removed while in the [employer]  . Mass excision  06/07/2011    Procedure: EXCISION MASS;  Surgeon: Fabio Bering;  Location: [facility];  Service: General;  Laterality: N/A;  excision of 2 masses back and buttocks  . Back surgery    . Esophagogastroduodenoscopy  03/03/2003    Large, deep prepyloric ulcer, as described above without bleeding/ Normal esophagus  . Esophagogastroduodenoscopy  09/03/2004    normal throughout  . Colonoscopy  09/03/2004    small ulcer, without stigmata of bleeding  . Esophagogastroduodenoscopy  11/14/2004    Normal esophagus and small hiatal hernia, otherwise normal stomach   . Colonoscopy  11/14/2004    Internal hemorrhoids, otherwise normal rectum/ left-sided diverticula , diffusely oozing right colon mucosa without a discrete lesion amenable to endoscopic therapy. 2 diminutive polyps. FELT TO HAVE AVMs/telangiectasias  . Colonoscopy  01/29/2005    Normal rectum  . Colonoscopy  03/11/2012    Colonic diverticulosis. Colonic polyps-removed as described above. Vascular anomalies in the cecum likely representing hemangiomas. Status post hemostasis clipping of  2 of the 3. ADENOMATOUS POLYPS. Repeat 2016  . Esophagogastroduodenoscopy  03/11/2012    Deformity  of the antrum;  small polyp in antrum-not manipulated. Otherwise normal exam    History   Social History  . Marital Status: Widowed    Spouse Name: N/A    Number of Children: 4  . Years of Education: N/A   Occupational History  . Retired    Social History Main Topics  . Smoking status: Former Smoker -- 1.00 packs/day for 70 years    Types: Cigarettes    Quit date: 06/25/2007  . Smokeless tobacco: Former Neurosurgeon  . Alcohol Use: No  . Drug Use: No  . Sexual Activity: Not Currently   Other Topics Concern  . Not on file   Social History Narrative   Has 3/4 children living.   1 son deceased at age 39 due to drunk driving.   Widow since 1966, lives with a daughter who is responsible for his care   Quit smoking in 2010, no alchol for over 10 years though he "loves it"   Retired from [employer], after 10 years in 1951. 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 Stroke Mother   . Stroke Brother   . Heart attack Brother   . Cancer Brother   . Cancer Brother   . Anesthesia problems Neg Hx   . Hypotension Neg Hx   . Malignant hyperthermia Neg Hx   . Pseudochol deficiency Neg Hx      Prior to Admission medications   Medication Sig Start Date End Date Taking? Authorizing Provider  Cholecalciferol 1000 UNITS tablet Take 1,000 Units by mouth daily.   Yes Historical Provider, MD  cycloSPORINE (RESTASIS) 0.05 % ophthalmic emulsion Place 1 drop into both eyes 2 (two) times daily.   Yes Historical Provider, MD  digoxin (LANOXIN) 0.125 MG  tablet Take 0.125 mg by mouth daily.   Yes Historical Provider, MD  Fluticasone-Salmeterol (ADVAIR) 100-50 MCG/DOSE AEPB Inhale 1 puff into the lungs 2 (two) times daily.   Yes Historical Provider, MD  furosemide (LASIX) 80 MG tablet Take 80 mg by mouth 2 (two) times daily. For 2 days start date 06/12/13   Yes Historical Provider, MD  guaiFENesin (MUCINEX) 600 MG 12 hr tablet Take  600 mg by mouth 2 (two) times daily.   Yes Historical Provider, MD  levothyroxine (SYNTHROID, LEVOTHROID) 75 MCG tablet Take 75 mcg by mouth daily before breakfast.   Yes Historical Provider, MD  lovastatin (MEVACOR) 40 MG tablet Take 40 mg by mouth every other day. Patient takes on Sunday,Monday,Wednesday,Friday   Yes Historical Provider, MD  Multiple Vitamins-Minerals (CENTRUM SILVER) tablet Take 1 tablet by mouth daily.   Yes Historical Provider, MD  omeprazole (PRILOSEC) 40 MG capsule Take 40 mg by mouth daily.   Yes Historical Provider, MD  potassium chloride SA (K-DUR,KLOR-CON) 20 MEQ tablet Take 20 mEq by mouth daily. For 2 days 06/12/13 & 06/13/13   Yes Historical Provider, MD  tamsulosin (FLOMAX) 0.4 MG CAPS capsule Take 0.4 mg by mouth at bedtime.    Yes Historical Provider, MD  albuterol (PROVENTIL) (2.5 MG/3ML) 0.083% nebulizer solution Take 2.5 mg by nebulization every 6 (six) hours as needed for wheezing or shortness of breath.    Historical Provider, MD  ipratropium (ATROVENT) 0.02 % nebulizer solution Take 0.5 mg by nebulization every 6 (six) hours as needed for wheezing or shortness of breath.    Historical Provider, MD     Review of systems complete and found to be negative unless listed above in HPI     Physical exam Blood pressure 89/51, pulse 88, temperature 97.3 F (36.3 C), temperature source Oral, resp. rate 18, height 6\' 2"  (1.88 m), weight 250 lb 7.1 oz (113.6 kg), SpO2 97.00%. General: NAD Neck: No JVD, no thyromegaly or thyroid nodule.  Lungs: Diminished at bases bilaterally with normal respiratory effort. CV: Nondisplaced PMI.  Heart regular S1/S2, III/VI harsh, late-peaking, ejection systolic, crescendo-decrescendo murmur.  2+ pitting pretibial edema to knees bilaterally.  No carotid bruit.  Normal pedal pulses.  Abdomen: Soft, nontender, no hepatosplenomegaly, no distention.  Skin: Intact without lesions or rashes.  Neurologic: Alert. Psych: Normal  affect. Extremities: No clubbing or cyanosis.  HEENT: Normal.   Labs:   Lab Results  Component Value Date   WBC 9.5 06/13/2013   HGB 13.3 06/13/2013   HCT 42.3 06/13/2013   MCV 102.2* 06/13/2013   PLT 137* 06/13/2013    Recent Labs Lab 06/13/13 0239 06/14/13 0541  NA 140 138  K 4.3 4.2  CL 99 96  CO2 33* 32  BUN 34* 35*  CREATININE 1.49* 1.45*  CALCIUM 8.6 8.4  PROT 7.2  --   BILITOT 1.1  --   ALKPHOS 55  --   ALT 10  --   AST 23  --   GLUCOSE 94 97   Lab Results  Component Value Date   CKTOTAL 96 01/19/2011   CKMB 7.5* 01/19/2011   TROPONINI <0.30 06/13/2013    Lab Results  Component Value Date   CHOL 125 05/10/2013   CHOL 127 12/17/2012   CHOL 137 05/08/2012   Lab Results  Component Value Date   HDL 59 05/10/2013   HDL 46 12/17/2012   HDL 66 96/29/5284   Lab Results  Component Value Date   LDLCALC 51 05/10/2013  LDLCALC 65 12/17/2012   LDLCALC 55 05/08/2012   Lab Results  Component Value Date   TRIG 74 05/10/2013   TRIG 82 12/17/2012   TRIG 81 05/08/2012   Lab Results  Component Value Date   CHOLHDL 2.1 05/10/2013   CHOLHDL 2.8 12/17/2012   CHOLHDL 2.1 05/08/2012   No results found for this basename: LDLDIRECT         Studies: Dg Chest Port 1 View  06/13/2013   CLINICAL DATA:  CHF  EXAM: PORTABLE CHEST - 1 VIEW  COMPARISON:  the previous day's study  FINDINGS: Marked cardiomegaly. Previous median sternotomy. Ectatic thoracic aorta. Pulmonary vascular congestion and probably mild interstitial edema, stable. Probable small pleural effusions. .  IMPRESSION: Stable cardiomegaly, mild edema and vascular congestion.   Electronically Signed   By: Oley Balm M.D.   On: 06/13/2013 16:38   Dg Chest Portable 1 View  06/12/2013   CLINICAL DATA:  Shortness breath, swelling  EXAM: PORTABLE CHEST - 1 VIEW  COMPARISON:  05/10/2013  FINDINGS: Cardiomegaly with mild interstitial edema. Moderate right and small left pleural effusions. Associated lower  lobe opacities, likely atelectasis.  IMPRESSION: Cardiomegaly with mild interstitial edema.  Moderate right and small left pleural effusions.   Electronically Signed   By: Charline Bills M.D.   On: 06/12/2013 17:55    ASSESSMENT AND PLAN:  1. Bradycardia in the context of atrial fibrillation:  he was diagnosed with hypothyroidism and is currently taking 75 mcg of Synthroid daily. His most recent TSH is 26.8. His hypothyroidism in addition to the recent addition of metoprolol likely precipitated this episodic bradycardia, and is likely contributing to his hypotension to some degree. I will increase his Synthroid to 100 mcg daily and discontinue his metoprolol. Given his nonspecific intraventricular conduction delay and atrial fibrillation, he likely has tenuous conduction system disease. Digoxin will be continued. 2. Acute on chronic systolic heart failure: I doubt the accuracy of his I/O's although he continues to have significant leg edema. I suspect his urinary output is diminished given his CKD. I will initiate a furosemide infusion to potentiate a more concentrated effect. His BP will have to be carefully monitored. I would not initiate an ACEI or ARB given both his CKD as well as his hypotension. He is also on Flomax and diminished urinary output may also be due to prostatic obstruction. 3. CAD s/p CABG: I would recommend treating this conservatively. He is not able to tolerate either antiplatelet therapy or anticoagulation due to his h/o a spontaneous retroperitoneal hematoma. He is on statin therapy. 4. Atrial fibrillation: he is rate controlled and cannot be anticoagulated for the previously mentioned reasons. At this point, I would keep him solely on digoxin and avoid other rate-slowing agents such as beta blockers. Given his depressed EF, calcium channel blockers should be avoided. 5. CKD: will need to be monitored with ongoing diuretic requirements. 6. Moderate aortic stenosis: this appears to  be stable. Will monitor. Will need to avoid marked drop in preload with advancing requirements for diuretic titration.  Signed: Prentice Docker, M.D., F.A.C.C.  06/14/2013, 3:44 PM

## 2013-06-14 NOTE — Progress Notes (Signed)
Notified MD that according to the Radiologist nurse there was 2300 cc removed with the paracentesis.  MD states the specimens is needed from the removed output.

## 2013-06-14 NOTE — Progress Notes (Signed)
Pharmacist Heart Failure Core Measure Documentation  Assessment: [name] has an EF documented as 35-40% on 08/13/12 by echo.  Rationale: Heart failure patients with left ventricular systolic dysfunction (LVSD) and an EF < 40% should be prescribed an angiotensin converting enzyme inhibitor (ACEI) or angiotensin receptor blocker (ARB) at discharge unless a contraindication is documented in the medical record.  This patient is not currently on an ACEI or ARB for HF.  This note is being placed in the record in order to provide documentation that a contraindication to the use of these agents is present for this encounter.  ACE Inhibitor or Angiotensin Receptor Blocker is contraindicated (specify all that apply)  []   ACEI allergy AND ARB allergy []   Angioedema []   Moderate or severe aortic stenosis []   Hyperkalemia []   Hypotension []   Renal artery stenosis [x]   Worsening renal function, preexisting renal disease or dysfunction   [name] A 06/14/2013 11:23 AM

## 2013-06-14 NOTE — Clinical Documentation Improvement (Signed)
Please clarify ARF. Thank you.  Possible Clinical Conditions? Acute renal failure  Acute respiratory failure Other Condition__________________ Cannot Clinically Determine   Supporting Information:  Risk Factors: Advanced age: 79 Home O2 History of Systolic and Diastolic heart failure ARF. Due to CHF decompensation. Proven with diuresis   Signs & Symptoms: Pulmonary edema  Pleural effusion  Diagnostics: BUN/CR/GFR: 12/20 36/1.61/36 12/21 34/1.49/40 12/22 35/1.45/41  CO2 12/20 & 21  = 33   Treatment Lasix IV 80mg  bid Daily BMP daily I&O monitored O2 therapy Keep sats >92%  Thank You, Harless Litten ,RN Clinical Documentation Specialist:  [phone]  [facility]- Health Information Management

## 2013-06-14 NOTE — Clinical Social Work Psychosocial (Signed)
Clinical Social Work Department BRIEF PSYCHOSOCIAL ASSESSMENT 06/14/2013  Patient:  [name], [name]     Account Number:  [id number]     Admit date:  06/12/2013  Clinical Social Worker:  Nancie Neas  Date/Time:  06/14/2013 12:50 PM  Referred by:  Physician  Date Referred:  06/14/2013 Referred for  SNF Placement   Other Referral:   Interview type:  Family Other interview type:   daughter- [name]    PSYCHOSOCIAL DATA Living Status:  FACILITY Admitted from facility:  [facility] Level of care:  Skilled Nursing Facility Primary support name:  [name] Primary support relationship to patient:  CHILD, ADULT Degree of support available:   supportive    CURRENT CONCERNS Current Concerns  Post-Acute Placement   Other Concerns:    SOCIAL WORK ASSESSMENT / PLAN CSW met with pt's daughter [name]. Pt well known to CSW from previous admissions. [name] is very involved and supportive. She reports that pt went home with hospice last hospitalization. Hospice started services, but then stopped because they did not feel he continued to meet qualifications. Hospice assisted [name] in placing pt at SNF. He went to [facility]. [name] reports pt has not been happy there. He has been requesting to return home. CSW discussed d/c plan from hospital. She reports plan is for pt to go home with home health. Pt has a hospital bed. [name] is with pt around the clock. CSW notified Debbie at [place] at [facility] request and CM in order to arrange home health again.   Assessment/plan status:  Referral to [facility] Other assessment/ plan:   Information/referral to community resources:   CM for home health    PATIENT'S/FAMILY'S RESPONSE TO PLAN OF CARE: Pt not available during CSW visit. Daughter plans to take pt home at d/c as this is his request. CSW will sign off but can be reconsulted if needed.       [name], [place] [phone]

## 2013-06-15 LAB — GC/CHLAMYDIA PROBE AMP: GC Probe RNA: NEGATIVE

## 2013-06-15 LAB — BASIC METABOLIC PANEL
CO2: 33 mEq/L — ABNORMAL HIGH (ref 19–32)
Calcium: 8.3 mg/dL — ABNORMAL LOW (ref 8.4–10.5)
Creatinine, Ser: 1.48 mg/dL — ABNORMAL HIGH (ref 0.50–1.35)
GFR calc Af Amer: 47 mL/min — ABNORMAL LOW (ref >90)
Potassium: 4.1 mEq/L (ref 3.5–5.1)

## 2013-06-15 LAB — MAGNESIUM: Magnesium: 2.3 mg/dL (ref 1.5–2.5)

## 2013-06-15 MED ORDER — METOLAZONE 5 MG PO TABS
2.5000 mg | ORAL_TABLET | Freq: Every day | ORAL | Status: AC
  Administered 2013-06-15 – 2013-06-16 (×2): 2.5 mg via ORAL
  Filled 2013-06-15 (×2): qty 1

## 2013-06-15 NOTE — Progress Notes (Addendum)
[facility]                                                                                Patient Demographics  [name], is a 79 y.o. male, DOB - 1922/03/08, ZOX:[id number]  Admit date - 06/12/2013   Admitting Physician Meredeth Ide, MD  Outpatient Primary MD for the patient is [name], MD  LOS - 3   No chief complaint on file.       Assessment & Plan    1. Anasarca, with pulmonary edema and pleural effusion along with ascites - this is likely secondary to underlying acute on chronic combined systolic and diastolic CHF. EF on recent echo is about 35% with LVH. - Improving with IV Lasix drip, has still fluid overload so will add Zaroxolyn for the next few days , cannot tolerate beta blocker due to episodes of bradycardia, this was stopped by cardiology. Continue fluid and salt restriction, monitor BMP closely. Liver function appears stable, UA has tracet proteinuria only. Oxygen supplementation and nebulizer treatments as needed. We'll place on ACE inhibitor once renal function is stable. Improved clinically.     2. Possible ascites (clinically). Ultrasound guided paracentesis done with about 2.5 L of acetic fluid removal.     3. Moderate underlying aortic stenosis. No acute issues monitor clinically, outpatient followup with cardiology post discharge.      4. Chronic atrial fibrillation with episodes of bradycardia. On digoxin, Lopressor stopped by cardiology, goal will be rate control, not on anticoagulation or aspirin. Will defer to primary cardiologist. Heart rate has improved.     5. Diaper rash due to incontinence along with inflammation around the prepuce. Agree with nystatin powder, Lotrisone cream, for now Foley catheter to prevent further skin breakdown. Urology following, some urethral discharge, specimen was sent for [facility] and chlamydia.     6. Hypothyroidism. TSH has been trending down as compared to the last TSH in November, Synthroid  dose has been adjusted by cardiology. Will repeat TSH in 4 weeks  Lab Results  Component Value Date   TSH 26.810* 06/14/2013      7. ARF. Due to CHF decompensation. Improving with diuresis. Baseline creatinine is around 1.2.      8. UTI. On appropriate antibiotics monitor.       Code Status: Full  Family Communication:    Disposition Plan: SNF   Procedures     Consults     Medications  Scheduled Meds: . cefTRIAXone (ROCEPHIN)  IV  1 g Intravenous Q24H  . Chlorhexidine Gluconate Cloth  6 each Topical Q0600  . clotrimazole-betamethasone   Topical BID  . cycloSPORINE  1 drop Both Eyes BID  . digoxin  0.125 mg Oral Daily  . guaiFENesin  600 mg Oral BID  . heparin subcutaneous  5,000 Units Subcutaneous Q8H  . levothyroxine  100 mcg Oral QAC breakfast  . metolazone  2.5 mg Oral Daily  . mometasone-formoterol  2 puff Inhalation BID  . mupirocin ointment  1 application Nasal BID  . nystatin   Topical BID  . pantoprazole  40 mg Oral Daily  . simvastatin  20 mg Oral q1800  . sodium chloride  3 mL Intravenous Q12H  . tamsulosin  0.4 mg Oral QHS   Continuous Infusions: . furosemide (LASIX) infusion 8 mg/hr (06/14/13 1750)   PRN Meds:.sodium chloride, albuterol, HYDROcodone-acetaminophen, ipratropium, lidocaine, sodium chloride  DVT Prophylaxis  heparin  Lab Results  Component Value Date   PLT 137* 06/13/2013    Antibiotics     Anti-infectives   Start     Dose/Rate Route Frequency Ordered Stop   06/14/13 1730  cefTRIAXone (ROCEPHIN) 1 g in dextrose 5 % 50 mL IVPB     1 g 100 mL/hr over 30 Minutes Intravenous Every 24 hours 06/14/13 1707            Subjective:   [name] today has, No headache, No chest pain, No abdominal pain - No Nausea, No new weakness tingling or numbness, No Cough - of shortness of breath  Objective:   Filed Vitals:   06/14/13 2049 06/14/13 2207 06/15/13 0511 06/15/13 0800  BP:  95/53 109/68   Pulse:  77 75    Temp:  98 F (36.7 C) 97.9 F (36.6 C)   TempSrc:  Oral Oral   Resp:  18 18   Height:      Weight:      SpO2: 95% 94% 97% 95%    Wt Readings from Last 3 Encounters:  06/12/13 113.6 kg (250 lb 7.1 oz)  05/13/13 79.2 kg (174 lb 9.7 oz)  04/13/13 103.7 kg (228 lb 9.9 oz)     Intake/Output Summary (Last 24 hours) at 06/15/13 0820 Last data filed at 06/15/13 0517  Gross per 24 hour  Intake    720 ml  Output    800 ml  Net    -80 ml    Exam Awake Alert, Oriented X 3, No new F.N deficits, Normal affect [place].AT,PERRAL Supple Neck,No JVD, No cervical lymphadenopathy appriciated.  Symmetrical Chest wall movement, Good air movement bilaterally, basilar rales RRR,No Gallops,Rubs or new Murmurs, No Parasternal Heave +ve B.Sounds, Abd Soft mildly distended, Non tender, No organomegaly appriciated, No rebound - guarding or rigidity. No Cyanosis, Clubbing , 2+ edema, No new Rash or bruise   Has some scrotal and penile edema, mild inflammation around the prepuce   Data Review   Micro Results Recent Results (from the past 240 hour(s))  MRSA PCR SCREENING     Status: Abnormal   Collection Time    06/13/13  1:52 AM      Result Value Range Status   MRSA by PCR POSITIVE (*) NEGATIVE Final   Comment:            The GeneXpert MRSA Assay (FDA     approved for NASAL specimens     only), is one component of a     comprehensive MRSA colonization     surveillance program. It is not     intended to diagnose MRSA     infection nor to guide or     monitor treatment for     MRSA infections.     RESULT CALLED TO, READ BACK BY AND VERIFIED WITH:      WALKER,L @ 0425 ON 06/13/13 BY [facility]    Radiology Reports Dg Chest Portable 1 View  06/12/2013   CLINICAL DATA:  Shortness breath, swelling  EXAM: PORTABLE CHEST - 1 VIEW  COMPARISON:  05/10/2013  FINDINGS: Cardiomegaly with mild interstitial edema. Moderate right and small left pleural effusions. Associated lower lobe opacities, likely  atelectasis.  IMPRESSION: Cardiomegaly with mild interstitial edema.  Moderate right and small left pleural effusions.   Electronically Signed   By: Charline Bills M.D.   On: 06/12/2013 17:55    CBC  Recent Labs Lab 06/12/13 1741 06/13/13 0239  WBC 9.7 9.5  HGB 13.3 13.3  HCT 42.1 42.3  PLT 139* 137*  MCV 102.7* 102.2*  MCH 32.4 32.1  MCHC 31.6 31.4  RDW 16.7* 16.6*  LYMPHSABS 0.9  --   MONOABS 0.9  --   EOSABS 0.1  --   BASOSABS 0.0  --     Chemistries   Recent Labs Lab 06/12/13 1741 06/13/13 0239 06/14/13 0541 06/15/13 0504  NA 136 140 138 137  K 4.7 4.3 4.2 4.1  CL 96 99 96 96  CO2 33* 33* 32 33*  GLUCOSE 94 94 97 103*  BUN 36* 34* 35* 39*  CREATININE 1.61* 1.49* 1.45* 1.48*  CALCIUM 9.0 8.6 8.4 8.3*  MG  --   --  2.2 2.3  AST 25 23  --   --   ALT 11 10  --   --   ALKPHOS 62 55  --   --   BILITOT 0.9 1.1  --   --    ------------------------------------------------------------------------------------------------------------------ estimated creatinine clearance is 45.4 ml/min (by C-G formula based on Cr of 1.48). ------------------------------------------------------------------------------------------------------------------ No results found for this basename: HGBA1C,  in the last 72 hours ------------------------------------------------------------------------------------------------------------------ No results found for this basename: CHOL, HDL, LDLCALC, TRIG, CHOLHDL, LDLDIRECT,  in the last 72 hours ------------------------------------------------------------------------------------------------------------------  Recent Labs  06/14/13 0541  TSH 26.810*   ------------------------------------------------------------------------------------------------------------------ No results found for this basename: VITAMINB12, FOLATE, FERRITIN, TIBC, IRON, RETICCTPCT,  in the last 72 hours  Coagulation profile No results found for this basename: INR,  PROTIME,  in the last 168 hours  No results found for this basename: DDIMER,  in the last 72 hours  Cardiac Enzymes  Recent Labs Lab 06/12/13 2122 06/13/13 0239 06/13/13 0858  TROPONINI <0.30 <0.30 <0.30   ------------------------------------------------------------------------------------------------------------------ No components found with this basename: POCBNP,      Time Spent in minutes   35   Susa Raring K M.D on 06/15/2013 at 8:20 AM  Between 7am to 7pm - Pager - [phone]  After 7pm go to [email or web address] - password [email or web address]  And look for the night coverage person covering for me after hours  [facility] Office  [phone]

## 2013-06-15 NOTE — Progress Notes (Signed)
SUBJECTIVE: Says breathing has improved as has leg swelling, but has "burning on top of the legs" which started today. Otherwise feels well.     Intake/Output Summary (Last 24 hours) at 06/15/13 1100 Last data filed at 06/15/13 0856  Gross per 24 hour  Intake    720 ml  Output    800 ml  Net    -80 ml    Current Facility-Administered Medications  Medication Dose Route Frequency Provider Last Rate Last Dose  . 0.9 %  sodium chloride infusion  250 mL Intravenous PRN Meredeth Ide, MD      . albuterol (PROVENTIL) (5 MG/ML) 0.5% nebulizer solution 2.5 mg  2.5 mg Nebulization Q6H PRN Meredeth Ide, MD   2.5 mg at 06/13/13 2059  . cefTRIAXone (ROCEPHIN) 1 g in dextrose 5 % 50 mL IVPB  1 g Intravenous Q24H Leroy Sea, MD   1 g at 06/14/13 1750  . Chlorhexidine Gluconate Cloth 2 % PADS 6 each  6 each Topical Q0600 Meredeth Ide, MD   6 each at 06/14/13 [phone]  . cycloSPORINE (RESTASIS) 0.05 % ophthalmic emulsion 1 drop  1 drop Both Eyes BID Meredeth Ide, MD   1 drop at 06/15/13 1015  . digoxin (LANOXIN) tablet 0.125 mg  0.125 mg Oral Daily Meredeth Ide, MD   0.125 mg at 06/15/13 1015  . furosemide (LASIX) 250 mg in dextrose 5 % 250 mL infusion  8 mg/hr Intravenous Continuous Laqueta Linden, MD 8 mL/hr at 06/14/13 1750 8 mg/hr at 06/14/13 1750  . guaiFENesin (MUCINEX) 12 hr tablet 600 mg  600 mg Oral BID Meredeth Ide, MD   600 mg at 06/15/13 1015  . heparin injection 5,000 Units  5,000 Units Subcutaneous Q8H Leroy Sea, MD   5,000 Units at 06/14/13 2151  . HYDROcodone-acetaminophen (NORCO/VICODIN) 5-325 MG per tablet 1-2 tablet  1-2 tablet Oral Q4H PRN Meredeth Ide, MD   1 tablet at 06/14/13 1126  . ipratropium (ATROVENT) nebulizer solution 0.5 mg  0.5 mg Nebulization Q6H PRN Meredeth Ide, MD   0.5 mg at 06/13/13 2059  . levothyroxine (SYNTHROID, LEVOTHROID) tablet 100 mcg  100 mcg Oral QAC breakfast Laqueta Linden, MD   100 mcg at 06/15/13 1016  . lidocaine (XYLOCAINE)  5 % ointment   Topical TID PRN Leroy Sea, MD      . metolazone (ZAROXOLYN) tablet 2.5 mg  2.5 mg Oral Daily Leroy Sea, MD   2.5 mg at 06/15/13 1015  . mometasone-formoterol (DULERA) 100-5 MCG/ACT inhaler 2 puff  2 puff Inhalation BID Meredeth Ide, MD   2 puff at 06/15/13 0759  . mupirocin ointment (BACTROBAN) 2 % 1 application  1 application Nasal BID Meredeth Ide, MD   1 application at 06/15/13 1017  . nystatin (MYCOSTATIN/NYSTOP) topical powder   Topical BID Meredeth Ide, MD      . pantoprazole (PROTONIX) EC tablet 40 mg  40 mg Oral Daily Meredeth Ide, MD   40 mg at 06/15/13 1015  . simvastatin (ZOCOR) tablet 20 mg  20 mg Oral q1800 Meredeth Ide, MD   20 mg at 06/14/13 1751  . sodium chloride 0.9 % injection 3 mL  3 mL Intravenous Q12H Meredeth Ide, MD   3 mL at 06/14/13 2152  . sodium chloride 0.9 % injection 3 mL  3 mL Intravenous PRN Meredeth Ide, MD      .  tamsulosin (FLOMAX) capsule 0.4 mg  0.4 mg Oral QHS Meredeth Ide, MD   0.4 mg at 06/14/13 2152    Filed Vitals:   06/14/13 2207 06/15/13 0511 06/15/13 0800 06/15/13 0936  BP: 95/53 109/68    Pulse: 77 75    Temp: 98 F (36.7 C) 97.9 F (36.6 C)    TempSrc: Oral Oral    Resp: 18 18    Height:      Weight:    250 lb 3.6 oz (113.5 kg)  SpO2: 94% 97% 95%     PHYSICAL EXAM General: NAD  Neck: No JVD, no thyromegaly or thyroid nodule.  Lungs: Diminished at bases bilaterally with normal respiratory effort.  CV: Nondisplaced PMI. Heart regular S1/S2, III/VI harsh, late-peaking, ejection systolic, crescendo-decrescendo murmur. 2+ pitting pretibial edema to knees bilaterally. No carotid bruit. Normal pedal pulses.  Abdomen: Soft, nontender, no hepatosplenomegaly, no distention.  Skin: Intact without lesions or rashes.  Neurologic: Alert.  Psych: Normal affect.  Extremities: No clubbing or cyanosis.  HEENT: Normal.    TELEMETRY: A fib with PVC's, HR 70 bpm range  LABS: Basic Metabolic Panel:  Recent Labs   06/14/13 0541 06/15/13 0504  NA 138 137  K 4.2 4.1  CL 96 96  CO2 32 33*  GLUCOSE 97 103*  BUN 35* 39*  CREATININE 1.45* 1.48*  CALCIUM 8.4 8.3*  MG 2.2 2.3   Liver Function Tests:  Recent Labs  06/12/13 1741 06/13/13 0239  AST 25 23  ALT 11 10  ALKPHOS 62 55  BILITOT 0.9 1.1  PROT 7.7 7.2  ALBUMIN 3.7 3.4*   No results found for this basename: LIPASE, AMYLASE,  in the last 72 hours CBC:  Recent Labs  06/12/13 1741 06/13/13 0239  WBC 9.7 9.5  NEUTROABS 7.8*  --   HGB 13.3 13.3  HCT 42.1 42.3  MCV 102.7* 102.2*  PLT 139* 137*   Cardiac Enzymes:  Recent Labs  06/12/13 2122 06/13/13 0239 06/13/13 0858  TROPONINI <0.30 <0.30 <0.30   BNP: No components found with this basename: POCBNP,  D-Dimer: No results found for this basename: DDIMER,  in the last 72 hours Hemoglobin A1C: No results found for this basename: HGBA1C,  in the last 72 hours Fasting Lipid Panel: No results found for this basename: CHOL, HDL, LDLCALC, TRIG, CHOLHDL, LDLDIRECT,  in the last 72 hours Thyroid Function Tests:  Recent Labs  06/14/13 0541  TSH 26.810*   Anemia Panel: No results found for this basename: VITAMINB12, FOLATE, FERRITIN, TIBC, IRON, RETICCTPCT,  in the last 72 hours  RADIOLOGY: US Paracentesis  06/14/2013   CLINICAL DATA:  Ascites, question cirrhosis  EXAM: ULTRASOUND GUIDED PARACENTESIS  COMPARISON:  CT abdomen and pelvis 05/11/2013  PROCEDURE: Procedure, benefits, and risks of procedure were discussed with patient.  Written informed consent for procedure was obtained.  Time out protocol followed.  Adequate collection of ascites localized in left lower quadrant.  Skin prepped and draped in usual sterile fashion.  Skin and soft tissues anesthetized with 8 mL of 1% lidocaine.  5 [place] Yueh catheter placed into peritoneal cavity.  2300 mL of clear yellow fluid aspirated by vacuum bottle suction.  Procedure tolerated well by patient without immediate complication.   FINDINGS: As above.  IMPRESSION: Successful ultrasound guided paracentesis yielding 2300 mL of ascites.   Electronically Signed   By: Ulyses Southward M.D.   On: 06/14/2013 17:14   Dg Chest Port 1 View  06/13/2013   CLINICAL  DATA:  CHF  EXAM: PORTABLE CHEST - 1 VIEW  COMPARISON:  the previous day's study  FINDINGS: Marked cardiomegaly. Previous median sternotomy. Ectatic thoracic aorta. Pulmonary vascular congestion and probably mild interstitial edema, stable. Probable small pleural effusions. .  IMPRESSION: Stable cardiomegaly, mild edema and vascular congestion.   Electronically Signed   By: Oley Balm M.D.   On: 06/13/2013 16:38   Dg Chest Portable 1 View  06/12/2013   CLINICAL DATA:  Shortness breath, swelling  EXAM: PORTABLE CHEST - 1 VIEW  COMPARISON:  05/10/2013  FINDINGS: Cardiomegaly with mild interstitial edema. Moderate right and small left pleural effusions. Associated lower lobe opacities, likely atelectasis.  IMPRESSION: Cardiomegaly with mild interstitial edema.  Moderate right and small left pleural effusions.   Electronically Signed   By: Charline Bills M.D.   On: 06/12/2013 17:55      ASSESSMENT AND PLAN: 1. Bradycardia in the context of atrial fibrillation: No further episodes since medication adjustments made yesterday (metoprolol d/c). He was diagnosed with hypothyroidism, and I increased his dose of Synthroid to 100 mcg daily on 12/22. His most recent TSH is 26.8. His hypothyroidism in addition to the recent addition of metoprolol likely precipitated this episodic bradycardia, and is likely contributing to his hypotension to some degree.Given his nonspecific intraventricular conduction delay and atrial fibrillation, he likely has tenuous conduction system disease. Digoxin will be continued.  2. Acute on chronic systolic heart failure: I doubt the accuracy of his I/O's although he continues to have significant leg edema. I suspect his urinary output is diminished given his  CKD. I will continue the furosemide infusion to potentiate a more concentrated effect, but will reduce the dose to 4 mg/hr, as metolazone was added by the hospitalist service (Dr. Thedore Mins). His BP will have to be carefully monitored. I would not initiate an ACEI or ARB given both his CKD as well as his hypotension. He is also on Flomax and diminished urinary output may also be due to prostatic obstruction.  3. CAD s/p CABG: I would recommend treating this conservatively. He is not able to tolerate either antiplatelet therapy or anticoagulation due to his h/o a spontaneous retroperitoneal hematoma. He is on statin therapy.  4. Atrial fibrillation: he is rate controlled and cannot be anticoagulated for the previously mentioned reasons. At this point, I would keep him solely on digoxin and avoid other rate-slowing agents such as beta blockers. Given his depressed EF, calcium channel blockers should be avoided.  5. CKD: will need to be monitored with ongoing diuretic requirements.  6. Moderate aortic stenosis: this appears to be stable. Will monitor. Will need to avoid marked drop in preload with advancing requirements for diuretic titration.    Prentice Docker, M.D., F.A.C.C.

## 2013-06-16 DIAGNOSIS — J449 Chronic obstructive pulmonary disease, unspecified: Secondary | ICD-10-CM

## 2013-06-16 DIAGNOSIS — E785 Hyperlipidemia, unspecified: Secondary | ICD-10-CM

## 2013-06-16 DIAGNOSIS — I2510 Atherosclerotic heart disease of native coronary artery without angina pectoris: Secondary | ICD-10-CM

## 2013-06-16 DIAGNOSIS — E039 Hypothyroidism, unspecified: Secondary | ICD-10-CM

## 2013-06-16 DIAGNOSIS — J9620 Acute and chronic respiratory failure, unspecified whether with hypoxia or hypercapnia: Secondary | ICD-10-CM

## 2013-06-16 DIAGNOSIS — I7090 Unspecified atherosclerosis: Secondary | ICD-10-CM

## 2013-06-16 DIAGNOSIS — I2789 Other specified pulmonary heart diseases: Secondary | ICD-10-CM

## 2013-06-16 DIAGNOSIS — N400 Enlarged prostate without lower urinary tract symptoms: Secondary | ICD-10-CM

## 2013-06-16 DIAGNOSIS — I959 Hypotension, unspecified: Secondary | ICD-10-CM

## 2013-06-16 LAB — BASIC METABOLIC PANEL
CO2: 34 mEq/L — ABNORMAL HIGH (ref 19–32)
Calcium: 8.2 mg/dL — ABNORMAL LOW (ref 8.4–10.5)
Chloride: 94 mEq/L — ABNORMAL LOW (ref 96–112)
Creatinine, Ser: 1.43 mg/dL — ABNORMAL HIGH (ref 0.50–1.35)

## 2013-06-16 LAB — CULTURE, ROUTINE-GENITAL

## 2013-06-16 LAB — MAGNESIUM: Magnesium: 2 mg/dL (ref 1.5–2.5)

## 2013-06-16 NOTE — Progress Notes (Signed)
Consulting cardiologist: Purvis Sheffield MD  Subjective:    He states he is feeling better, but overall weak. Breathing status about the same.  Objective:   Temp:  [97.5 F (36.4 C)-98 F (36.7 C)] 97.9 F (36.6 C) (12/24 0621) Pulse Rate:  [66-92] 92 (12/24 0621) Resp:  [18-20] 20 (12/24 0621) BP: (94-106)/(53-74) 100/74 mmHg (12/24 0621) SpO2:  [89 %-95 %] 89 % (12/24 0750) Weight:  [250 lb 3.6 oz (113.5 kg)] 250 lb 3.6 oz (113.5 kg) (12/23 0936) Last BM Date: 06/15/13  Filed Weights   06/12/13 2054 06/15/13 0936  Weight: 250 lb 7.1 oz (113.6 kg) 250 lb 3.6 oz (113.5 kg)    Intake/Output Summary (Last 24 hours) at 06/16/13 0837 Last data filed at 06/15/13 2118  Gross per 24 hour  Intake    720 ml  Output   1951 ml  Net  -1231 ml    Telemetry: Atrial fib rates in the 80's with PVC;s.  Exam:  General: No acute distress.  HEENT: Conjunctiva and lids normal, oropharynx clear.  Lungs: Inspiratory and expiratory crackles with upper airway wheezes.   Cardiac: No elevated JVP or bruits. RRR, 1/6 systolic murmur  Abdomen: Normoactive bowel sounds, nontender, nondistended.  Extremities: 2+ pitting edema, distal pulses full.  Neuropsychiatric: Alert and oriented x3, affect appropriate.   Lab Results:  Basic Metabolic Panel:  Recent Labs Lab 06/14/13 0541 06/15/13 0504 06/16/13 0502  NA 138 137 136  K 4.2 4.1 3.6  CL 96 96 94*  CO2 32 33* 34*  GLUCOSE 97 103* 110*  BUN 35* 39* 36*  CREATININE 1.45* 1.48* 1.43*  CALCIUM 8.4 8.3* 8.2*  MG 2.2 2.3 2.0    Liver Function Tests:  Recent Labs Lab 06/12/13 1741 06/13/13 0239  AST 25 23  ALT 11 10  ALKPHOS 62 55  BILITOT 0.9 1.1  PROT 7.7 7.2  ALBUMIN 3.7 3.4*    CBC:  Recent Labs Lab 06/12/13 1741 06/13/13 0239  WBC 9.7 9.5  HGB 13.3 13.3  HCT 42.1 42.3  MCV 102.7* 102.2*  PLT 139* 137*    Cardiac Enzymes:  Recent Labs Lab 06/12/13 2122 06/13/13 0239 06/13/13 0858  TROPONINI <0.30  <0.30 <0.30    BNP:  Recent Labs  05/10/13 0630 05/11/13 0553 06/12/13 1741  PROBNP 7291.0* 7912.0* 9057.0*    Radiology: US Paracentesis  06/14/2013   CLINICAL DATA:  Ascites, question cirrhosis  EXAM: ULTRASOUND GUIDED PARACENTESIS  COMPARISON:  CT abdomen and pelvis 05/11/2013  PROCEDURE: Procedure, benefits, and risks of procedure were discussed with patient.  Written informed consent for procedure was obtained.  Time out protocol followed.  Adequate collection of ascites localized in left lower quadrant.  Skin prepped and draped in usual sterile fashion.  Skin and soft tissues anesthetized with 8 mL of 1% lidocaine.  5 [place] Yueh catheter placed into peritoneal cavity.  2300 mL of clear yellow fluid aspirated by vacuum bottle suction.  Procedure tolerated well by patient without immediate complication.  FINDINGS: As above.  IMPRESSION: Successful ultrasound guided paracentesis yielding 2300 mL of ascites.   Electronically Signed   By: Ulyses Southward M.D.   On: 06/14/2013 17:14    Medications:   Scheduled Medications: . cefTRIAXone (ROCEPHIN)  IV  1 g Intravenous Q24H  . Chlorhexidine Gluconate Cloth  6 each Topical Q0600  . cycloSPORINE  1 drop Both Eyes BID  . digoxin  0.125 mg Oral Daily  . guaiFENesin  600 mg Oral BID  .  heparin subcutaneous  5,000 Units Subcutaneous Q8H  . levothyroxine  100 mcg Oral QAC breakfast  . metolazone  2.5 mg Oral Daily  . mometasone-formoterol  2 puff Inhalation BID  . mupirocin ointment  1 application Nasal BID  . nystatin   Topical BID  . pantoprazole  40 mg Oral Daily  . simvastatin  20 mg Oral q1800  . sodium chloride  3 mL Intravenous Q12H  . tamsulosin  0.4 mg Oral QHS     Infusions: . furosemide (LASIX) infusion 4 mg/hr (06/15/13 1145)     PRN Medications:  sodium chloride, albuterol, HYDROcodone-acetaminophen, ipratropium, lidocaine, sodium chloride   Assessment and Plan:   1. Acute on Chronic Systolic CHF: Still on lasix  gtt, with weights pending this am. LEE is prominent. Breathing status is somewhat improved. Upper airway wheezes are noted. Creatinine 1.43 this am. Potassium 3.6. He continues on metolazone 2.5 mg daily and lasix gtt at 4 mg.. Dry wt 228 lbs per last hospitalization discharge wt.   2. Atrial fibrillation: Heart rate is controlled on digoxin, was bradycardic initially. Metoprolol was discontinued. Stable at this time. No anticoagulation secondary to retroperitoneal hematoma.   3. CAD: Hx of CABG Medical management only.   4. Moderate AoV stenosis; He is stable. Watching BP response to diureses, to avoid drop in preload. No longer on BB due to bradycardia.   5. Hypothyroidism: Synthroid increased yesterday.    Bettey Mare. [name] [name] 06/16/2013, 8:37 AM

## 2013-06-16 NOTE — Progress Notes (Signed)
1. Bradycardia in the context of atrial fibrillation: No further episodes since medication adjustments made 12/22 (metoprolol d/c). He was diagnosed with hypothyroidism, and I increased his dose of Synthroid to 100 mcg daily on 12/22. His most recent TSH is 26.8. His hypothyroidism in addition to the recent addition of metoprolol likely precipitated this episodic bradycardia, and likely contributed to his hypotension to some degree.Given his nonspecific intraventricular conduction delay and atrial fibrillation, he likely has tenuous conduction system disease. Digoxin will be continued.  2. Acute on chronic systolic heart failure: this is improving, although he continues to have significant leg edema. I suspect his urinary output is diminished given his CKD. I will continue the furosemide infusion at 4 mg/hr, as metolazone was added by the hospitalist service on 12/23 (Dr. Thedore Mins). His BP will have to be carefully monitored. I would not initiate an ACEI or ARB given both his CKD as well as his hypotension. He is also on Flomax and diminished urinary output may also be due to prostatic obstruction.  3. CAD s/p CABG: I would recommend treating this conservatively. He is not able to tolerate either antiplatelet therapy or anticoagulation due to his h/o a spontaneous retroperitoneal hematoma. He is on statin therapy.  4. Atrial fibrillation: he is rate controlled and cannot be anticoagulated for the previously mentioned reasons. At this point, I would keep him solely on digoxin and avoid other rate-slowing agents such as beta blockers. Given his depressed EF, calcium channel blockers should be avoided.  5. CKD: will need to be monitored with ongoing diuretic requirements.  6. Moderate aortic stenosis: this appears to be stable. Will monitor. Will need to avoid marked drop in preload with advancing requirements for diuretic titration.

## 2013-06-16 NOTE — Progress Notes (Signed)
[facility]                                                                                Patient Demographics  [name], is a 79 y.o. male, DOB - 04/05/1922, OZH:[id number]  Admit date - 06/12/2013   Admitting Physician Meredeth Ide, MD  Outpatient Primary MD for the patient is [name], MD  LOS - 4   No chief complaint on file.       Assessment & Plan    1. Anasarca, with pulmonary edema and pleural effusion along with ascites - this is likely secondary to underlying acute on chronic combined systolic and diastolic CHF. EF on recent echo is about 35% with LVH. - Improving with IV Lasix drip & Zaroxolyn , chloride levels are falling down despite clinical fluid overload, will monitor BMP closely and likely cut back on diuresis and the next day or so. He cannot tolerate beta blocker due to episodes of bradycardia, this was stopped by cardiology. Continue fluid and salt restriction. We'll place on ACE inhibitor once renal function is stable. Improved clinically.  Liver function appears stable, UA has tracet proteinuria only. Oxygen supplementation and nebulizer treatments as needed.      2. Ascites  . Ultrasound guided paracentesis done with about 2.5 L of acetic fluid removal.     3. Moderate underlying aortic stenosis. No acute issues monitor clinically, outpatient followup with cardiology post discharge.      4. Chronic atrial fibrillation with episodes of bradycardia. On digoxin, Lopressor stopped by cardiology, goal will be rate control, not on anticoagulation or aspirin due to past history of spontaneous retroperitoneal bleed. Will defer to primary cardiologist. Heart rate has improved.     5. Diaper rash due to incontinence along with inflammation around the prepuce. Agree with nystatin powder, Lotrisone cream, for now Foley catheter to prevent further skin breakdown. Urology following, some urethral discharge, negative for GC and  chlamydia.     6. Hypothyroidism. TSH has been trending down as compared to the last TSH in November, Synthroid dose has been adjusted by cardiology. Will repeat TSH in 4 weeks  Lab Results  Component Value Date   TSH 26.810* 06/14/2013       7. ARF. Due to CHF decompensation. Improving with diuresis. Baseline creatinine is around 1.2.     8. UTI. On appropriate antibiotics monitor.       Code Status: Full  Family Communication:    Disposition Plan: SNF   Procedures     Consults     Medications  Scheduled Meds: . cefTRIAXone (ROCEPHIN)  IV  1 g Intravenous Q24H  . Chlorhexidine Gluconate Cloth  6 each Topical Q0600  . cycloSPORINE  1 drop Both Eyes BID  . digoxin  0.125 mg Oral Daily  . guaiFENesin  600 mg Oral BID  . heparin subcutaneous  5,000 Units Subcutaneous Q8H  . levothyroxine  100 mcg Oral QAC breakfast  . metolazone  2.5 mg Oral Daily  . mometasone-formoterol  2 puff Inhalation BID  . mupirocin ointment  1 application Nasal BID  . nystatin   Topical BID  . pantoprazole  40 mg Oral Daily  .  simvastatin  20 mg Oral q1800  . sodium chloride  3 mL Intravenous Q12H  . tamsulosin  0.4 mg Oral QHS   Continuous Infusions: . furosemide (LASIX) infusion 4 mg/hr (06/15/13 1145)   PRN Meds:.sodium chloride, albuterol, HYDROcodone-acetaminophen, ipratropium, lidocaine, sodium chloride  DVT Prophylaxis  heparin  Lab Results  Component Value Date   PLT 137* 06/13/2013    Antibiotics     Anti-infectives   Start     Dose/Rate Route Frequency Ordered Stop   06/14/13 1730  cefTRIAXone (ROCEPHIN) 1 g in dextrose 5 % 50 mL IVPB     1 g 100 mL/hr over 30 Minutes Intravenous Every 24 hours 06/14/13 1707            Subjective:   [name] today has, No headache, No chest pain, No abdominal pain - No Nausea, No new weakness tingling or numbness, No Cough - of shortness of breath  Objective:   Filed Vitals:   06/15/13 2017 06/15/13 2242  06/16/13 0621 06/16/13 0750  BP:  106/53 100/74   Pulse:  77 92   Temp:  98 F (36.7 C) 97.9 F (36.6 C)   TempSrc:  Oral    Resp:  20 20   Height:      Weight:      SpO2: 92% 92% 91% 89%    Wt Readings from Last 3 Encounters:  06/15/13 113.5 kg (250 lb 3.6 oz)  05/13/13 79.2 kg (174 lb 9.7 oz)  04/13/13 103.7 kg (228 lb 9.9 oz)     Intake/Output Summary (Last 24 hours) at 06/16/13 0846 Last data filed at 06/15/13 2118  Gross per 24 hour  Intake    720 ml  Output   1951 ml  Net  -1231 ml    Exam Awake Alert, Oriented X 3, No new F.N deficits, Normal affect [place].AT,PERRAL Supple Neck,No JVD, No cervical lymphadenopathy appriciated.  Symmetrical Chest wall movement, Good air movement bilaterally, basilar rales RRR,No Gallops,Rubs or new Murmurs, No Parasternal Heave +ve B.Sounds, Abd Soft mildly distended, Non tender, No organomegaly appriciated, No rebound - guarding or rigidity. No Cyanosis, Clubbing , 2+ edema, No new Rash or bruise   Has some scrotal and penile edema, mild inflammation around the prepuce   Data Review   Micro Results Recent Results (from the past 240 hour(s))  MRSA PCR SCREENING     Status: Abnormal   Collection Time    06/13/13  1:52 AM      Result Value Range Status   MRSA by PCR POSITIVE (*) NEGATIVE Final   Comment:            The GeneXpert MRSA Assay (FDA     approved for NASAL specimens     only), is one component of a     comprehensive MRSA colonization     surveillance program. It is not     intended to diagnose MRSA     infection nor to guide or     monitor treatment for     MRSA infections.     RESULT CALLED TO, READ BACK BY AND VERIFIED WITH:      WALKER,L @ 0425 ON 06/13/13 BY WOODIE,J  URINE CULTURE     Status: None   Collection Time    06/13/13 11:08 AM      Result Value Range Status   Specimen Description URINE, CLEAN CATCH   Final   Special Requests NONE   Final   Culture  Setup  Time     Final   Value: 06/14/2013  11:46     Performed at [facility] Count     Final   Value: >=100,000 COLONIES/ML     Performed at [facility]   Culture     Final   Value: ESCHERICHIA COLI     Performed at [facility]   Report Status PENDING   Incomplete  GC/CHLAMYDIA PROBE AMP     Status: None   Collection Time    06/13/13  9:54 PM      Result Value Range Status   CT Probe RNA NEGATIVE  NEGATIVE Final   GC Probe RNA NEGATIVE  NEGATIVE Final   Comment: (NOTE)                                                                                               **Normal Reference Range: Negative**          Assay performed using the Gen-Probe APTIMA COMBO2 (R) Assay.     Acceptable specimen types for this assay include APTIMA Swabs (Unisex,     endocervical, urethral, or vaginal), first void urine, and ThinPrep     liquid based cytology samples.     Performed at [facility]  CULTURE, ROUTINE-GENITAL     Status: None   Collection Time    06/13/13 10:30 PM      Result Value Range Status   Specimen Description URETHRA   Final   Special Requests NONE   Final   Culture     Final   Value: Culture reincubated for better growth     Performed at [facility]   Report Status PENDING   Incomplete    Radiology Reports Dg Chest Portable 1 View  06/12/2013   CLINICAL DATA:  Shortness breath, swelling  EXAM: PORTABLE CHEST - 1 VIEW  COMPARISON:  05/10/2013  FINDINGS: Cardiomegaly with mild interstitial edema. Moderate right and small left pleural effusions. Associated lower lobe opacities, likely atelectasis.  IMPRESSION: Cardiomegaly with mild interstitial edema.  Moderate right and small left pleural effusions.   Electronically Signed   By: Charline Bills M.D.   On: 06/12/2013 17:55    CBC  Recent Labs Lab 06/12/13 1741 06/13/13 0239  WBC 9.7 9.5  HGB 13.3 13.3  HCT 42.1 42.3  PLT 139* 137*  MCV 102.7* 102.2*  MCH 32.4 32.1  MCHC 31.6 31.4  RDW 16.7* 16.6*   LYMPHSABS 0.9  --   MONOABS 0.9  --   EOSABS 0.1  --   BASOSABS 0.0  --     Chemistries   Recent Labs Lab 06/12/13 1741 06/13/13 0239 06/14/13 0541 06/15/13 0504 06/16/13 0502  NA 136 140 138 137 136  K 4.7 4.3 4.2 4.1 3.6  CL 96 99 96 96 94*  CO2 33* 33* 32 33* 34*  GLUCOSE 94 94 97 103* 110*  BUN 36* 34* 35* 39* 36*  CREATININE 1.61* 1.49* 1.45* 1.48* 1.43*  CALCIUM 9.0 8.6 8.4 8.3* 8.2*  MG  --   --  2.2 2.3 2.0  AST 25 23  --   --   --   ALT 11 10  --   --   --   ALKPHOS 62 55  --   --   --   BILITOT 0.9 1.1  --   --   --    ------------------------------------------------------------------------------------------------------------------ estimated creatinine clearance is 46.9 ml/min (by C-G formula based on Cr of 1.43). ------------------------------------------------------------------------------------------------------------------ No results found for this basename: HGBA1C,  in the last 72 hours ------------------------------------------------------------------------------------------------------------------ No results found for this basename: CHOL, HDL, LDLCALC, TRIG, CHOLHDL, LDLDIRECT,  in the last 72 hours ------------------------------------------------------------------------------------------------------------------  Recent Labs  06/14/13 0541  TSH 26.810*   ------------------------------------------------------------------------------------------------------------------ No results found for this basename: VITAMINB12, FOLATE, FERRITIN, TIBC, IRON, RETICCTPCT,  in the last 72 hours  Coagulation profile No results found for this basename: INR, PROTIME,  in the last 168 hours  No results found for this basename: DDIMER,  in the last 72 hours  Cardiac Enzymes  Recent Labs Lab 06/12/13 2122 06/13/13 0239 06/13/13 0858  TROPONINI <0.30 <0.30 <0.30    ------------------------------------------------------------------------------------------------------------------ No components found with this basename: POCBNP,      Time Spent in minutes   35   Susa Raring K M.D on 06/16/2013 at 8:46 AM  Between 7am to 7pm - Pager - [phone]  After 7pm go to [email or web address] - password [email or web address]  And look for the night coverage person covering for me after hours  [facility] Office  [phone]

## 2013-06-17 DIAGNOSIS — J9600 Acute respiratory failure, unspecified whether with hypoxia or hypercapnia: Secondary | ICD-10-CM

## 2013-06-17 DIAGNOSIS — I5043 Acute on chronic combined systolic (congestive) and diastolic (congestive) heart failure: Principal | ICD-10-CM

## 2013-06-17 LAB — URINE CULTURE: Colony Count: >=100000

## 2013-06-17 LAB — BASIC METABOLIC PANEL
BUN: 35 mg/dL — ABNORMAL HIGH (ref 6–23)
CO2: 33 mEq/L — ABNORMAL HIGH (ref 19–32)
Calcium: 8.6 mg/dL (ref 8.4–10.5)
Chloride: 92 mEq/L — ABNORMAL LOW (ref 96–112)
Creatinine, Ser: 1.44 mg/dL — ABNORMAL HIGH (ref 0.50–1.35)
GFR calc non Af Amer: 42 mL/min — ABNORMAL LOW (ref >90)
Glucose, Bld: 91 mg/dL (ref 70–99)
Potassium: 3.4 mEq/L — ABNORMAL LOW (ref 3.5–5.1)

## 2013-06-17 LAB — MAGNESIUM: Magnesium: 2 mg/dL (ref 1.5–2.5)

## 2013-06-17 MED ORDER — FUROSEMIDE 80 MG PO TABS: 80.0000 mg | ORAL_TABLET | Freq: Two times a day (BID) | ORAL | Status: DC

## 2013-06-17 MED ORDER — POTASSIUM CHLORIDE CRYS ER 20 MEQ PO TBCR
40.0000 meq | EXTENDED_RELEASE_TABLET | Freq: Two times a day (BID) | ORAL | Status: DC
  Administered 2013-06-17 – 2013-06-19 (×5): 40 meq via ORAL
  Filled 2013-06-17 (×5): qty 2

## 2013-06-17 MED ORDER — METOLAZONE 5 MG PO TABS
2.5000 mg | ORAL_TABLET | Freq: Every day | ORAL | Status: DC
  Administered 2013-06-17 – 2013-06-19 (×3): 2.5 mg via ORAL
  Filled 2013-06-17 (×3): qty 1

## 2013-06-17 MED ORDER — FUROSEMIDE 10 MG/ML IJ SOLN
4.0000 mg/h | INTRAVENOUS | Status: DC
  Filled 2013-06-17: qty 25

## 2013-06-17 NOTE — Progress Notes (Addendum)
[facility] PROGRESS NOTE  [name] WGN:[id number] DOB: 1922/05/14 DOA: 06/12/2013 PCP: [name], MD  Brief narrative: 79 y/o male admitted for generalized edema diagnosed with acute on chronic CHF despite Lasix use as outpt.    Assessment/Plan: Principal Problem:   Acute respiratory failure/  Acute on chronic systolic and diastolic congestive heart failure - EF 35 %, Mod Pulm HTN - appears to be significantly improved but still continues to appear fluid overloaded- this appears to be more right heart failure than left at this time - states he is chronically on 2-3 L of 2 at home, therefore, is at his baseline (if this information is accurate)  - has only diuresed 2400 cc - weight down by 7 lbs - cont Lasix infusion for now- may be approaching a point of discontinuation based upon labs and alkalosis  - ambulate and assess for dyspnea- may have to tolerate some pedal edema to avoid progressive renal failure   Active Problems:  Ascites - due to above issues - s/p paracentesis and stable  ARF on CKD 3 - imporved with diuresis and near baseline    Atrial fibrillation, chronic with bradycardic episodes - hypothyroidism contributing? Cont Digoxin as recommended by Cardiology - not a candidate for anticoagulation due to spontaneous retroperitoneal hemorrhage - rate controlled on current management    CAD s/p CABG  Short runs of PVC/ V-tach - may consider B blocker as HR improved- will allow cardiology to decide on this - Mg normal - replacing K- start 40 BID for now and recheck in AM   Mod Ao Stenosis - avoid drops in BP- currently maintaining > 100 systolic  Candida dermatitis of perineal area - cont current management  Urethral discharge - negative for GC and Chlamydia  Hypothyroid - TSH quite elevated-  Synthroid dose modified from 75 to 100 mcg- check free T4.   E coli UTI - cont emperic Rocephin  - sensitivities pending  Thrombocytopenia -  appears chronic   Code Status: full code  Family Communication: none  Disposition Plan: SNF  Consultants:  Cardiology  Urology  Procedures: - 12/22- paracentesis  Antibiotics:  Rocephin 12/22  DVT Prophylaxis:  Heparin   HPI/Subjective: Pt sitting up in bed eating breakfast- states he is feeling much better without any dyspnea- no cough or chest pain   Objective: Filed Vitals:   06/17/13 0532 06/17/13 0542 06/17/13 0714 06/17/13 0819  BP: 102/56     Pulse: 70   84  Temp: 98.9 F (37.2 C)     TempSrc: Oral     Resp: 20     Height:      Weight:      SpO2: 92% 89% 9%     Intake/Output Summary (Last 24 hours) at 06/17/13 0837 Last data filed at 06/17/13 0600  Gross per 24 hour  Intake    870 ml  Output   2200 ml  Net  -1330 ml    Exam:   General:  AAO x 3, no distres  Cardiovascular: RRR, no murmurs  Respiratory: mild rhonchi, decreased breath sounds at bases, mild crackles at bases  Abdomen: soft, NT, ND BS+  Ext: edema- pitting b/l 2+  Data Reviewed: Basic Metabolic Panel:  Recent Labs Lab 06/13/13 0239 06/14/13 0541 06/15/13 0504 06/16/13 0502 06/17/13 0602  NA 140 138 137 136 138  K 4.3 4.2 4.1 3.6 3.4*  CL 99 96 96 94* 92*  CO2 33* 32 33* 34* 33*  GLUCOSE 94 97  103* 110* 91  BUN 34* 35* 39* 36* 35*  CREATININE 1.49* 1.45* 1.48* 1.43* 1.44*  CALCIUM 8.6 8.4 8.3* 8.2* 8.6  MG  --  2.2 2.3 2.0 2.0   Liver Function Tests:  Recent Labs Lab 06/12/13 1741 06/13/13 0239  AST 25 23  ALT 11 10  ALKPHOS 62 55  BILITOT 0.9 1.1  PROT 7.7 7.2  ALBUMIN 3.7 3.4*   No results found for this basename: LIPASE, AMYLASE,  in the last 168 hours No results found for this basename: AMMONIA,  in the last 168 hours CBC:  Recent Labs Lab 06/12/13 1741 06/13/13 0239  WBC 9.7 9.5  NEUTROABS 7.8*  --   HGB 13.3 13.3  HCT 42.1 42.3  MCV 102.7* 102.2*  PLT 139* 137*   Cardiac Enzymes:  Recent Labs Lab 06/12/13 1741 06/12/13 2122  06/13/13 0239 06/13/13 0858  TROPONINI <0.30 <0.30 <0.30 <0.30   BNP (last 3 results)  Recent Labs  05/10/13 0630 05/11/13 0553 06/12/13 1741  PROBNP 7291.0* 7912.0* 9057.0*   CBG: No results found for this basename: GLUCAP,  in the last 168 hours  Recent Results (from the past 240 hour(s))  MRSA PCR SCREENING     Status: Abnormal   Collection Time    06/13/13  1:52 AM      Result Value Range Status   MRSA by PCR POSITIVE (*) NEGATIVE Final   Comment:            The GeneXpert MRSA Assay (FDA     approved for NASAL specimens     only), is one component of a     comprehensive MRSA colonization     surveillance program. It is not     intended to diagnose MRSA     infection nor to guide or     monitor treatment for     MRSA infections.     RESULT CALLED TO, READ BACK BY AND VERIFIED WITH:      WALKER,L @ 0425 ON 06/13/13 BY WOODIE,J  URINE CULTURE     Status: None   Collection Time    06/13/13 11:08 AM      Result Value Range Status   Specimen Description URINE, CLEAN CATCH   Final   Special Requests NONE   Final   Culture  Setup Time     Final   Value: 06/14/2013 11:46     Performed at [facility] Count     Final   Value: >=100,000 COLONIES/ML     Performed at [facility]   Culture     Final   Value: ESCHERICHIA COLI     Performed at [facility]   Report Status PENDING   Incomplete  GC/CHLAMYDIA PROBE AMP     Status: None   Collection Time    06/13/13  9:54 PM      Result Value Range Status   CT Probe RNA NEGATIVE  NEGATIVE Final   GC Probe RNA NEGATIVE  NEGATIVE Final   Comment: (NOTE)                                                                                               **  Normal Reference Range: Negative**          Assay performed using the Gen-Probe APTIMA COMBO2 (R) Assay.     Acceptable specimen types for this assay include APTIMA Swabs (Unisex,     endocervical, urethral, or vaginal), first void urine, and  ThinPrep     liquid based cytology samples.     Performed at [facility]  CULTURE, ROUTINE-GENITAL     Status: None   Collection Time    06/13/13 10:30 PM      Result Value Range Status   Specimen Description URETHRA   Final   Special Requests NONE   Final   Culture     Final   Value: MULTIPLE ORGANISMS PRESENT, NONE PREDOMINANT     Performed at [facility]   Report Status 06/16/2013 FINAL   Final     Studies: US Paracentesis  06/14/2013   CLINICAL DATA:  Ascites, question cirrhosis  EXAM: ULTRASOUND GUIDED PARACENTESIS  COMPARISON:  CT abdomen and pelvis 05/11/2013  PROCEDURE: Procedure, benefits, and risks of procedure were discussed with patient.  Written informed consent for procedure was obtained.  Time out protocol followed.  Adequate collection of ascites localized in left lower quadrant.  Skin prepped and draped in usual sterile fashion.  Skin and soft tissues anesthetized with 8 mL of 1% lidocaine.  5 [place] Yueh catheter placed into peritoneal cavity.  2300 mL of clear yellow fluid aspirated by vacuum bottle suction.  Procedure tolerated well by patient without immediate complication.  FINDINGS: As above.  IMPRESSION: Successful ultrasound guided paracentesis yielding 2300 mL of ascites.   Electronically Signed   By: Ulyses Southward M.D.   On: 06/14/2013 17:14   Dg Chest Port 1 View  06/13/2013   CLINICAL DATA:  CHF  EXAM: PORTABLE CHEST - 1 VIEW  COMPARISON:  the previous day's study  FINDINGS: Marked cardiomegaly. Previous median sternotomy. Ectatic thoracic aorta. Pulmonary vascular congestion and probably mild interstitial edema, stable. Probable small pleural effusions. .  IMPRESSION: Stable cardiomegaly, mild edema and vascular congestion.   Electronically Signed   By: Oley Balm M.D.   On: 06/13/2013 16:38   Dg Chest Portable 1 View  06/12/2013   CLINICAL DATA:  Shortness breath, swelling  EXAM: PORTABLE CHEST - 1 VIEW  COMPARISON:  05/10/2013  FINDINGS:  Cardiomegaly with mild interstitial edema. Moderate right and small left pleural effusions. Associated lower lobe opacities, likely atelectasis.  IMPRESSION: Cardiomegaly with mild interstitial edema.  Moderate right and small left pleural effusions.   Electronically Signed   By: Charline Bills M.D.   On: 06/12/2013 17:55    Scheduled Meds: . cefTRIAXone (ROCEPHIN)  IV  1 g Intravenous Q24H  . Chlorhexidine Gluconate Cloth  6 each Topical Q0600  . cycloSPORINE  1 drop Both Eyes BID  . digoxin  0.125 mg Oral Daily  . guaiFENesin  600 mg Oral BID  . heparin subcutaneous  5,000 Units Subcutaneous Q8H  . levothyroxine  100 mcg Oral QAC breakfast  . mometasone-formoterol  2 puff Inhalation BID  . mupirocin ointment  1 application Nasal BID  . nystatin   Topical BID  . pantoprazole  40 mg Oral Daily  . simvastatin  20 mg Oral q1800  . sodium chloride  3 mL Intravenous Q12H  . tamsulosin  0.4 mg Oral QHS   Continuous Infusions: . furosemide (LASIX) infusion 4 mg/hr (06/16/13 1659)    ________________________________________________________________________  Time spent in minutes: 35 min  [facility]  [facility] Pager [phone] If 8PM-8AM, please contact night-coverage at [email or web address], password [email or web address] 06/17/2013, 8:37 AM  LOS: 5 days

## 2013-06-18 LAB — BASIC METABOLIC PANEL
BUN: 35 mg/dL — ABNORMAL HIGH (ref 6–23)
CO2: 36 mEq/L — ABNORMAL HIGH (ref 19–32)
Calcium: 8.2 mg/dL — ABNORMAL LOW (ref 8.4–10.5)
Creatinine, Ser: 1.51 mg/dL — ABNORMAL HIGH (ref 0.50–1.35)
Glucose, Bld: 85 mg/dL (ref 70–99)
Sodium: 136 mEq/L (ref 135–145)

## 2013-06-18 LAB — MAGNESIUM: Magnesium: 1.9 mg/dL (ref 1.5–2.5)

## 2013-06-18 MED ORDER — FUROSEMIDE 80 MG PO TABS
80.0000 mg | ORAL_TABLET | Freq: Two times a day (BID) | ORAL | Status: DC
  Administered 2013-06-18: 80 mg via ORAL
  Filled 2013-06-18: qty 1

## 2013-06-18 MED ORDER — POTASSIUM CHLORIDE CRYS ER 20 MEQ PO TBCR: 20.0000 meq | EXTENDED_RELEASE_TABLET | Freq: Once | ORAL | Status: DC

## 2013-06-18 MED ORDER — FUROSEMIDE 10 MG/ML IJ SOLN
60.0000 mg | Freq: Two times a day (BID) | INTRAMUSCULAR | Status: DC
  Administered 2013-06-18 – 2013-06-19 (×2): 60 mg via INTRAVENOUS
  Filled 2013-06-18 (×2): qty 6

## 2013-06-18 MED ORDER — IPRATROPIUM BROMIDE 0.02 % IN SOLN
0.5000 mg | Freq: Two times a day (BID) | RESPIRATORY_TRACT | Status: DC
  Administered 2013-06-19: 0.5 mg via RESPIRATORY_TRACT
  Filled 2013-06-18: qty 2.5

## 2013-06-18 MED ORDER — ALBUTEROL SULFATE (5 MG/ML) 0.5% IN NEBU
2.5000 mg | INHALATION_SOLUTION | Freq: Two times a day (BID) | RESPIRATORY_TRACT | Status: DC
  Administered 2013-06-19: 2.5 mg via RESPIRATORY_TRACT
  Filled 2013-06-18: qty 0.5

## 2013-06-18 NOTE — Progress Notes (Signed)
Subjective: Patinet denies CP  Still coughing up a lot of secretiions  Breathing not at baseline.   Objective: Filed Vitals:   06/17/13 2058 06/18/13 0433 06/18/13 0735 06/18/13 0851  BP: 103/45 90/66 105/46   Pulse: 77 67 83   Temp: 97.5 F (36.4 C) 98.3 F (36.8 C)    TempSrc: Oral Oral    Resp: 20 20 18    Height:      Weight:      SpO2: 89% 82% 92% 91%   Weight change:   Intake/Output Summary (Last 24 hours) at 06/18/13 0914 Last data filed at 06/18/13 0436  Gross per 24 hour  Intake    480 ml  Output   2050 ml  Net  -1570 ml    General: Alert, awake, oriented x3, in no acute distress Neck:  JVP is 9 cm Heart: Irregular rate and rhythm  III/VI systolic murmur L sternal border Lungs: Coarse rhonchi throughout Exemities:  1+ edema.   Neuro: Grossly intact, nonfocal.  Tele:  Afib  60s -80s with freq PVCs Lab Results: Results for orders placed during the hospital encounter of 06/12/13 (from the past 24 hour(s))  BASIC METABOLIC PANEL     Status: Abnormal   Collection Time    06/18/13  5:17 AM      Result Value Range   Sodium 136  135 - 145 mEq/L   Potassium 3.6  3.5 - 5.1 mEq/L   Chloride 92 (*) 96 - 112 mEq/L   CO2 36 (*) 19 - 32 mEq/L   Glucose, Bld 85  70 - 99 mg/dL   BUN 35 (*) 6 - 23 mg/dL   Creatinine, Ser 1.61 (*) 0.50 - 1.35 mg/dL   Calcium 8.2 (*) 8.4 - 10.5 mg/dL   GFR calc non Af Amer 39 (*) >90 mL/min   GFR calc Af Amer 45 (*) >90 mL/min  MAGNESIUM     Status: None   Collection Time    06/18/13  5:17 AM      Result Value Range   Magnesium 1.9  1.5 - 2.5 mg/dL    Studies/Results: @RISRSLT24 @  Medications: Reviewed   @PROBHOSP @  1.  Bradycardia  Resolved.  Follow   2. Hypothyroid  Supplementation increased  Will need f/u 3.  Acute on chronic diastolic CHF  Volme is still increased   I would switch to IV lasix 60- bid  and follow strict I/O And renal function.  4.  CAD/ s/p CABG.  Continue medical Rx 5.  Atrial fib  Reates OK    LOS: 6  days   Dietrich Pates 06/18/2013, 9:14 AM

## 2013-06-18 NOTE — Evaluation (Signed)
Physical Therapy Evaluation Patient Details Name: [name] MRN: [id number] DOB: [birth year] Today's Date: 06/18/2013 Time: 1200-1240 PT Time Calculation (min): 40 min  PT Assessment / Plan / Recommendation History of Present Illness  79 year old male who   has a past medical history of Mitral valve prolapse (2003); Obesity; Osteoarthritis; Hypertension; Hyperlipidemia; COPD (chronic obstructive pulmonary disease); CHF (congestive heart failure) (diastolic 10/2010); Pneumonia; Impaired glucose tolerance; Atrial fibrillation, chronic; Prostate cancer; GERD (gastroesophageal reflux disease); Arteriosclerotic cardiovascular disease (ASCVD) (1994); Hard of hearing; Anemia, iron deficiency; AAA (abdominal aortic aneurysm); Gastric ulcer (2004); Renal cysts, acquired, bilateral; Adenomatous polyps (04/07/2012); HYPERTENSION (12/17/2007); NSTEMI (non-ST elevated myocardial infarction) (01/18/2011); Diabetes mellitus, type II (07/13/12); Macrocytosis without anemia (08/13/2012); Acute systolic congestive heart failure (08/15/2012); Aortic stenosis, moderate (08/15/2012); Pulmonary hypertension due to COPD (08/15/2012); Tricuspid valve regurgitation (08/15/2012); Retroperitoneal bleed (06/2012); On home O2; Left leg swelling (03/2013); Clostridium difficile colitis (03/2013); and Unspecified hypothyroidism (05/11/2013).  Pt has been admitted from NH with SOB and swelling  Clinical Impression  Pt is able to do more than he thinks.  Pt tends to not assist unless therapist is stern that he must assist with sit to stand and that we are not going to bring two people in to lift him.no    PT Assessment  Patient needs continued PT services    Follow Up Recommendations  SNF    Does the patient have the potential to tolerate intense rehabilitation    no   Barriers to Discharge  none      Equipment Recommendations  None recommended by PT    Recommendations for Other Services   OT  Frequency Min 3X/week     Precautions / Restrictions Precautions Precautions: Fall Restrictions Weight Bearing Restrictions: No   Pertinent Vitals/Pain None noted      Mobility  Bed Mobility Bed Mobility: Rolling Right;Supine to Sit;Sitting - Scoot to [place] of Bed;Sit to Supine Rolling Right: With rail;4: Min assist Supine to Sit: 4: Min assist (with significant encouragement) Sitting - Scoot to Edge of Bed: 4: Min assist Sit to Supine: 6: Modified independent (Device/Increase time) Transfers Transfers: Sit to Stand Sit to Stand: 2: Max assist Details for Transfer Assistance: Pt adament he needs two people and almost will not attempt with one person assist.  Therapist convinced pt to try on first attempt pt unable to clear buttock; therapist noted pt was not assiting; spoke to pt that he needed to try and that I could tell he was not attempting to assitt on the secont attempt pt did assist and came 3/4 the way up to standing; began to work on side stepping when pt began to have a BM Ambulation/Gait Ambulation/Gait Assistance: Not tested (comment)        PT Diagnosis: Difficulty walking;Generalized weakness  PT Problem List: Decreased strength;Decreased activity tolerance;Decreased mobility PT Treatment Interventions: Gait training;Therapeutic exercise;Therapeutic activities;Balance training     PT Goals(Current goals can be found in the care plan section)    Visit Information  Last PT Received On: 06/18/13 History of Present Illness: 79 year old male who   has a past medical history of Mitral valve prolapse (2003); Obesity; Osteoarthritis; Hypertension; Hyperlipidemia; COPD (chronic obstructive pulmonary disease); CHF (congestive heart failure) (diastolic 10/2010); Pneumonia; Impaired glucose tolerance; Atrial fibrillation, chronic; Prostate cancer; GERD (gastroesophageal reflux disease); Arteriosclerotic cardiovascular disease (ASCVD) (1994); Hard of hearing; Anemia, iron deficiency; AAA (abdominal  aortic aneurysm); Gastric ulcer (2004); Renal cysts, acquired, bilateral; Adenomatous polyps (04/07/2012); HYPERTENSION (12/17/2007); NSTEMI (non-ST elevated myocardial infarction) (01/18/2011);  Diabetes mellitus, type II (07/13/12); Macrocytosis without anemia (08/13/2012); Acute systolic congestive heart failure (08/15/2012); Aortic stenosis, moderate (08/15/2012); Pulmonary hypertension due to COPD (08/15/2012); Tricuspid valve regurgitation (08/15/2012); Retroperitoneal bleed (06/2012); On home O2; Left leg swelling (03/2013); Clostridium difficile colitis (03/2013); and Unspecified hypothyroidism (05/11/2013).  Pt has been admitted from NH with SOB and swellin       Prior Functioning  Home Living Family/patient expects to be discharged to:: Skilled nursing facility Prior Function Level of Independence: Needs assistance Gait / Transfers Assistance Needed: Pt states he has been trying to walk but uses a 4 wheel walker and is unable to get up with out the assistance of two people.   Communication Communication: HOH Dominant Hand: Right    Cognition  Cognition Overall Cognitive Status: Within Functional Limits for tasks assessed    Extremity/Trunk Assessment Upper Extremity Assessment Upper Extremity Assessment: Defer to OT evaluation Lower Extremity Assessment Lower Extremity Assessment: Generalized weakness   Balance    End of Session PT - End of Session Equipment Utilized During Treatment: Gait belt Activity Tolerance: Other (comment) (limited by pt view of himself being limited) Patient left: in bed;with nursing/sitter in room (on bed pan )  GP     RUSSELL,CINDY 06/18/2013, 12:46 PM

## 2013-06-18 NOTE — Progress Notes (Addendum)
[facility]                                                                                Patient Demographics  [name], is a 79 y.o. male, DOB - 08-23-1921, ZOX:[id number]  Admit date - 06/12/2013   Admitting Physician Leonard Ide, MD  Outpatient Primary MD for the patient is Leonard Overman, MD  LOS - 6   No chief complaint on file.       Assessment & Plan    1. Anasarca, with pulmonary edema and pleural effusion along with ascites - this is likely secondary to underlying acute on chronic combined systolic and diastolic CHF. EF on recent echo is about 35% with LVH, question underlying right sided heart failure - Improved so we'll switch to oral Lasix along with Zaroxolyn, follow BMP closely. He cannot tolerate beta blocker due to episodes of bradycardia, this was stopped by cardiology. Continue fluid and salt restriction. We'll place on ACE inhibitor once renal function is stable. Improved clinically.  Liver function appears stable, UA has tracet proteinuria only. Oxygen supplementation and nebulizer treatments as needed.      2. Ascites  . Ultrasound guided paracentesis done with about 2.5 L of acetic fluid removal.     3. Moderate underlying aortic stenosis. No acute issues monitor clinically, outpatient followup with cardiology post discharge. Avoid over diuresis and abrupt fall in preload.      4. Chronic atrial fibrillation with episodes of bradycardia. On digoxin, Lopressor stopped by cardiology, goal will be rate control, not on anticoagulation or aspirin due to past history of spontaneous retroperitoneal bleed. Will defer to primary cardiologist. Heart rate has improved.     5. Diaper rash due to incontinence along with inflammation around the prepuce. Agree with nystatin powder, Lotrisone cream, for now Foley catheter to prevent further skin breakdown. Urology following, some urethral discharge, negative for GC and chlamydia.     6.  Hypothyroidism. TSH has been trending down as compared to the last TSH in November, Synthroid dose has been adjusted by cardiology. Will repeat TSH in 4 weeks  Lab Results  Component Value Date   TSH 26.810* 06/14/2013       7. ARF. Due to CHF decompensation. Improving with diuresis. Baseline creatinine is around 1.2.     8. UTI. On appropriate antibiotics monitor. Will discontinue Foley catheter on 06/18/2013 and monitor.       Code Status: DNR  Family Communication:  [name], DNR, DC with home hospice  Disposition Plan: SNF   Procedures     Consults  Urology, cardiology   Medications  Scheduled Meds: . cefTRIAXone (ROCEPHIN)  IV  1 g Intravenous Q24H  . cycloSPORINE  1 drop Both Eyes BID  . digoxin  0.125 mg Oral Daily  . furosemide  80 mg Oral BID  . guaiFENesin  600 mg Oral BID  . heparin subcutaneous  5,000 Units Subcutaneous Q8H  . levothyroxine  100 mcg Oral QAC breakfast  . metolazone  2.5 mg Oral Daily  . mometasone-formoterol  2 puff Inhalation BID  . nystatin   Topical BID  . pantoprazole  40 mg Oral Daily  . potassium  chloride  40 mEq Oral BID  . simvastatin  20 mg Oral q1800  . sodium chloride  3 mL Intravenous Q12H  . tamsulosin  0.4 mg Oral QHS   Continuous Infusions:   PRN Meds:.sodium chloride, albuterol, HYDROcodone-acetaminophen, ipratropium, lidocaine, sodium chloride  DVT Prophylaxis  heparin  Lab Results  Component Value Date   PLT 137* 06/13/2013    Antibiotics     Anti-infectives   Start     Dose/Rate Route Frequency Ordered Stop   06/14/13 1730  cefTRIAXone (ROCEPHIN) 1 g in dextrose 5 % 50 mL IVPB     1 g 100 mL/hr over 30 Minutes Intravenous Every 24 hours 06/14/13 1707            Subjective:   [name] today has, No headache, No chest pain, No abdominal pain - No Nausea, No new weakness tingling or numbness, No Cough - of shortness of breath  Objective:   Filed Vitals:   06/17/13 2058 06/18/13  0433 06/18/13 0735 06/18/13 0851  BP: 103/45 90/66 105/46   Pulse: 77 67 83   Temp: 97.5 F (36.4 C) 98.3 F (36.8 C)    TempSrc: Oral Oral    Resp: 20 20 18    Height:      Weight:      SpO2: 89% 82% 92% 91%    Wt Readings from Last 3 Encounters:  06/16/13 110.224 kg (243 lb)  05/13/13 79.2 kg (174 lb 9.7 oz)  04/13/13 103.7 kg (228 lb 9.9 oz)     Intake/Output Summary (Last 24 hours) at 06/18/13 0920 Last data filed at 06/18/13 0900  Gross per 24 hour  Intake    720 ml  Output   2050 ml  Net  -1330 ml    Exam Awake Alert, Oriented X 3, No new F.N deficits, Normal affect [place].AT,PERRAL Supple Neck,No JVD, No cervical lymphadenopathy appriciated.  Symmetrical Chest wall movement, Good air movement bilaterally, basilar rales RRR,No Gallops,Rubs or new Murmurs, No Parasternal Heave +ve B.Sounds, Abd Soft mildly distended, Non tender, No organomegaly appriciated, No rebound - guarding or rigidity. No Cyanosis, Clubbing , 2+ edema, No new Rash or bruise   Has some scrotal and penile edema, mild inflammation around the prepuce   Data Review   Micro Results Recent Results (from the past 240 hour(s))  MRSA PCR SCREENING     Status: Abnormal   Collection Time    06/13/13  1:52 AM      Result Value Range Status   MRSA by PCR POSITIVE (*) NEGATIVE Final   Comment:            The GeneXpert MRSA Assay (FDA     approved for NASAL specimens     only), is one component of a     comprehensive MRSA colonization     surveillance program. It is not     intended to diagnose MRSA     infection nor to guide or     monitor treatment for     MRSA infections.     RESULT CALLED TO, READ BACK BY AND VERIFIED WITH:      WALKER,L @ 0425 ON 06/13/13 BY WOODIE,J  URINE CULTURE     Status: None   Collection Time    06/13/13 11:08 AM      Result Value Range Status   Specimen Description URINE, CLEAN CATCH   Final   Special Requests NONE   Final   Culture  Setup Time  Final   Value:  06/14/2013 11:46     Performed at [facility] Count     Final   Value: >=100,000 COLONIES/ML     Performed at [facility]   Culture     Final   Value: ESCHERICHIA COLI     Performed at [facility]   Report Status 06/17/2013 FINAL   Final   Organism ID, Bacteria ESCHERICHIA COLI   Final  GC/CHLAMYDIA PROBE AMP     Status: None   Collection Time    06/13/13  9:54 PM      Result Value Range Status   CT Probe RNA NEGATIVE  NEGATIVE Final   GC Probe RNA NEGATIVE  NEGATIVE Final   Comment: (NOTE)                                                                                               **Normal Reference Range: Negative**          Assay performed using the Gen-Probe APTIMA COMBO2 (R) Assay.     Acceptable specimen types for this assay include APTIMA Swabs (Unisex,     endocervical, urethral, or vaginal), first void urine, and ThinPrep     liquid based cytology samples.     Performed at [facility]  CULTURE, ROUTINE-GENITAL     Status: None   Collection Time    06/13/13 10:30 PM      Result Value Range Status   Specimen Description URETHRA   Final   Special Requests NONE   Final   Culture     Final   Value: MULTIPLE ORGANISMS PRESENT, NONE PREDOMINANT     Performed at [facility]   Report Status 06/16/2013 FINAL   Final    Radiology Reports Dg Chest Portable 1 View  06/12/2013   CLINICAL DATA:  Shortness breath, swelling  EXAM: PORTABLE CHEST - 1 VIEW  COMPARISON:  05/10/2013  FINDINGS: Cardiomegaly with mild interstitial edema. Moderate right and small left pleural effusions. Associated lower lobe opacities, likely atelectasis.  IMPRESSION: Cardiomegaly with mild interstitial edema.  Moderate right and small left pleural effusions.   Electronically Signed   By: Charline Bills M.D.   On: 06/12/2013 17:55    CBC  Recent Labs Lab 06/12/13 1741 06/13/13 0239  WBC 9.7 9.5  HGB 13.3 13.3  HCT 42.1 42.3  PLT 139* 137*   MCV 102.7* 102.2*  MCH 32.4 32.1  MCHC 31.6 31.4  RDW 16.7* 16.6*  LYMPHSABS 0.9  --   MONOABS 0.9  --   EOSABS 0.1  --   BASOSABS 0.0  --     Chemistries   Recent Labs Lab 06/12/13 1741 06/13/13 0239 06/14/13 0541 06/15/13 0504 06/16/13 0502 06/17/13 0602 06/18/13 0517  NA 136 140 138 137 136 138 136  K 4.7 4.3 4.2 4.1 3.6 3.4* 3.6  CL 96 99 96 96 94* 92* 92*  CO2 33* 33* 32 33* 34* 33* 36*  GLUCOSE 94 94 97 103* 110* 91 85  BUN 36* 34* 35* 39* 36* 35* 35*  CREATININE  1.61* 1.49* 1.45* 1.48* 1.43* 1.44* 1.51*  CALCIUM 9.0 8.6 8.4 8.3* 8.2* 8.6 8.2*  MG  --   --  2.2 2.3 2.0 2.0 1.9  AST 25 23  --   --   --   --   --   ALT 11 10  --   --   --   --   --   ALKPHOS 62 55  --   --   --   --   --   BILITOT 0.9 1.1  --   --   --   --   --    ------------------------------------------------------------------------------------------------------------------ estimated creatinine clearance is 43.8 ml/min (by C-G formula based on Cr of 1.51). ------------------------------------------------------------------------------------------------------------------ No results found for this basename: HGBA1C,  in the last 72 hours ------------------------------------------------------------------------------------------------------------------ No results found for this basename: CHOL, HDL, LDLCALC, TRIG, CHOLHDL, LDLDIRECT,  in the last 72 hours ------------------------------------------------------------------------------------------------------------------ No results found for this basename: TSH, T4TOTAL, FREET3, T3FREE, THYROIDAB,  in the last 72 hours ------------------------------------------------------------------------------------------------------------------ No results found for this basename: VITAMINB12, FOLATE, FERRITIN, TIBC, IRON, RETICCTPCT,  in the last 72 hours  Coagulation profile No results found for this basename: INR, PROTIME,  in the last 168 hours  No results found  for this basename: DDIMER,  in the last 72 hours  Cardiac Enzymes  Recent Labs Lab 06/12/13 2122 06/13/13 0239 06/13/13 0858  TROPONINI <0.30 <0.30 <0.30   ------------------------------------------------------------------------------------------------------------------ No components found with this basename: POCBNP,      Time Spent in minutes   35   Susa Raring K M.D on 06/18/2013 at 9:20 AM  Between 7am to 7pm - Pager - [phone]  After 7pm go to [email or web address] - password [email or web address]  And look for the night coverage person covering for me after hours  [facility] Office  [phone]

## 2013-06-18 NOTE — Progress Notes (Signed)
Nutrition Brief Note  RD pulled to chart due to LOS (day 6).   Wt Readings from Last 15 Encounters:  06/16/13 243 lb (110.224 kg)  05/13/13 174 lb 9.7 oz (79.2 kg)  04/13/13 228 lb 9.9 oz (103.7 kg)  04/04/13 236 lb 15.9 oz (107.5 kg)  03/05/13 234 lb 1.6 oz (106.187 kg)  12/23/12 226 lb (102.513 kg)  09/10/12 224 lb (101.606 kg)  08/31/12 214 lb 9.6 oz (97.342 kg)  08/18/12 231 lb 11.3 oz (105.1 kg)  08/04/12 238 lb 5.1 oz (108.1 kg)  07/28/12 238 lb 5.1 oz (108.1 kg)  07/14/12 246 lb 0.5 oz (111.6 kg)  05/27/12 240 lb (108.863 kg)  05/13/12 235 lb (106.595 kg)  05/06/12 235 lb (106.595 kg)   Pt admitted for CHF and anasarca. Wt hx reveal UBW of 225-230#. Noted wt gain, likely due to edema. Pt has been diuresing and has home health services set up upon discharge.   Body mass index is 31.19 kg/(m^2). Patient meets criteria for obesity, class I based on current BMI.   Current diet order is Heart Healthy, patient is consuming approximately 75-100% of meals at this time. Labs and medications reviewed.   No nutrition interventions warranted at this time. If nutrition issues arise, please consult RD.   [name], RD, LDN Pager: [phone]

## 2013-06-18 NOTE — Clinical Social Work Note (Signed)
CSW received referral for home hospice. Notified CM.   [name], [place] [phone]

## 2013-06-18 NOTE — Progress Notes (Signed)
PT's daughter states he takes his nebulizer before every dulera mdi treatment and is q6 prn other wise. He does this at home his neb will be changed to bid, she also stated he forgets to ask for his treatment.

## 2013-06-19 MED ORDER — CYCLOSPORINE 0.05 % OP EMUL: 1.0000 [drp] | Freq: Two times a day (BID) | OPHTHALMIC | Status: AC

## 2013-06-19 MED ORDER — CHOLECALCIFEROL 25 MCG (1000 UT) PO TABS: 1000.0000 [IU] | ORAL_TABLET | Freq: Every day | ORAL | Status: DC

## 2013-06-19 MED ORDER — OMEPRAZOLE 40 MG PO CPDR: 40.0000 mg | DELAYED_RELEASE_CAPSULE | Freq: Every day | ORAL | Status: DC

## 2013-06-19 MED ORDER — ALBUTEROL SULFATE (2.5 MG/3ML) 0.083% IN NEBU: 2.5000 mg | INHALATION_SOLUTION | Freq: Four times a day (QID) | RESPIRATORY_TRACT | Status: AC | PRN

## 2013-06-19 MED ORDER — TAMSULOSIN HCL 0.4 MG PO CAPS: 0.4000 mg | ORAL_CAPSULE | Freq: Every day | ORAL | Status: DC

## 2013-06-19 MED ORDER — CENTRUM SILVER PO TABS: 1.0000 | ORAL_TABLET | Freq: Every day | ORAL | Status: DC

## 2013-06-19 MED ORDER — FLUTICASONE-SALMETEROL 100-50 MCG/DOSE IN AEPB: 1.0000 | INHALATION_SPRAY | Freq: Two times a day (BID) | RESPIRATORY_TRACT | Status: AC

## 2013-06-19 MED ORDER — POTASSIUM CHLORIDE CRYS ER 20 MEQ PO TBCR: 20.0000 meq | EXTENDED_RELEASE_TABLET | Freq: Every day | ORAL | Status: DC

## 2013-06-19 MED ORDER — FUROSEMIDE 80 MG PO TABS: 80.0000 mg | ORAL_TABLET | Freq: Two times a day (BID) | ORAL | Status: AC

## 2013-06-19 MED ORDER — METOLAZONE 2.5 MG PO TABS: 2.5000 mg | ORAL_TABLET | ORAL | Status: DC

## 2013-06-19 MED ORDER — LOVASTATIN 40 MG PO TABS: 40.0000 mg | ORAL_TABLET | ORAL | Status: DC

## 2013-06-19 MED ORDER — LEVOTHYROXINE SODIUM 100 MCG PO TABS: 100.0000 ug | ORAL_TABLET | Freq: Every day | ORAL | Status: AC

## 2013-06-19 MED ORDER — DIGOXIN 125 MCG PO TABS: 0.1250 mg | ORAL_TABLET | Freq: Every day | ORAL | Status: DC

## 2013-06-19 MED ORDER — NYSTATIN 100000 UNIT/GM EX POWD: CUTANEOUS | Status: AC

## 2013-06-19 NOTE — Progress Notes (Signed)
D/c instructions reviewed with patient's daughter. Verbalized understanding. Pt dc'd to home with daughter via [facility]. [place], Candelaria Stagers 06/19/2013

## 2013-06-19 NOTE — Discharge Summary (Signed)
[facility]                                                                                   [name], is a 79 y.o. male  DOB 04/09/1922  MRN [id number].  Admission date:  06/12/2013  Admitting Physician  Meredeth Ide, MD  Discharge Date:  06/19/2013   Primary MD  [name], MD  Recommendations for primary care physician for things to follow:   Follow clinically, monitor weight and BMP closely   Admission Diagnosis  Anasarca [782.3] CHF (congestive heart failure) [428.0] Atrial fibrillation, chronic [427.31] Chronic combined systolic and diastolic CHF (congestive heart failure) [428.42, 428.0]  Discharge Diagnosis   acute on chronic combined systolic and diastolic CHF, atrial fibrillation  Principal Problem:   Acute respiratory failure Active Problems:   Acute on chronic combined systolic and diastolic CHF (congestive heart failure)   Atrial fibrillation, chronic   Acute on chronic diastolic congestive heart failure   Aortic stenosis, moderate   Pulmonary hypertension due to COPD   Anasarca      Past Medical History  Diagnosis Date  . Mitral valve prolapse 2003    MVR in 2003  . Obesity   . Osteoarthritis   . Hypertension   . Hyperlipidemia   . COPD (chronic obstructive pulmonary disease)   . CHF (congestive heart failure) diastolic 10/2010    CXR in 12/2010: Prior CABG, cardiomegaly, vascular redistribution, bibasilar atelectasis, small effusions  . Pneumonia   . Impaired glucose tolerance   . Atrial fibrillation, chronic   . Prostate cancer     elevated PSA  . GERD (gastroesophageal reflux disease)     + hiatal hernia  . Arteriosclerotic cardiovascular disease (ASCVD) 1994    stent to RCA; CABG-2003  . Hard of hearing   . Anemia, iron deficiency   . AAA (abdominal aortic aneurysm)     Fusiform; infrarenal; 4-4.1cm on CT in 07/2008 and 12/2010 by MRI  . Gastric ulcer 2004    2004; upper GI bleed  . Renal cysts, acquired, bilateral      Complex by MRI in 12/2010  . Adenomatous polyps 04/07/2012  . HYPERTENSION 12/17/2007    Subsequently hypotensive and all antihypertensive medication discontinued Lab  03/2012: Mild anemia with H&H-11.8/37.5, MCV-97, ferritin-224, normal CMet ex G-124, alb-3.4   . NSTEMI (non-ST elevated myocardial infarction) 01/18/2011  . Diabetes mellitus, type II 07/13/12    family denies patient is diabetic  . Macrocytosis without anemia 08/13/2012  . Acute systolic congestive heart failure 08/15/2012    EF 35-40%, per Echo  . Aortic stenosis, moderate 08/15/2012  . Pulmonary hypertension due to COPD 08/15/2012    53 mm Hg  . Tricuspid valve regurgitation 08/15/2012  . Retroperitoneal bleed 06/2012  . On home O2   . Left leg swelling 03/2013  . Clostridium difficile colitis 03/2013  . Unspecified hypothyroidism 05/11/2013    Newly diagnosed.    Past Surgical History  Procedure Laterality Date  . Knee surgery      Left  . Bladder surgery    . Femoral artery stent  12-06-10    Left SFA  . Appendectomy    .  Mitral valve replacement (mvr)/coronary artery bypass grafting (cabg)  2003    stent to RCA in 1994  . Eye surgery  2007    bilateral cataracts  . Tonsillectomy      thinks they were removed while in the [employer]  . Mass excision  06/07/2011    Procedure: EXCISION MASS;  Surgeon: Fabio Bering;  Location: [facility];  Service: General;  Laterality: N/A;  excision of 2 masses back and buttocks  . Back surgery    . Esophagogastroduodenoscopy  03/03/2003    Large, deep prepyloric ulcer, as described above without bleeding/ Normal esophagus  . Esophagogastroduodenoscopy  09/03/2004    normal throughout  . Colonoscopy  09/03/2004    small ulcer, without stigmata of bleeding  . Esophagogastroduodenoscopy  11/14/2004    Normal esophagus and small hiatal hernia, otherwise normal stomach   . Colonoscopy  11/14/2004    Internal hemorrhoids, otherwise normal rectum/ left-sided diverticula , diffusely oozing  right colon mucosa without a discrete lesion amenable to endoscopic therapy. 2 diminutive polyps. FELT TO HAVE AVMs/telangiectasias  . Colonoscopy  01/29/2005    Normal rectum  . Colonoscopy  03/11/2012    Colonic diverticulosis. Colonic polyps-removed as described above. Vascular anomalies in the cecum likely representing hemangiomas. Status post hemostasis clipping of  2 of the 3. ADENOMATOUS POLYPS. Repeat 2016  . Esophagogastroduodenoscopy  03/11/2012    Deformity of the antrum;  small polyp in antrum-not manipulated. Otherwise normal exam     Discharge Condition: Stable but poor long term prognosis being discharged with home hospice.        Follow-up Information   Follow up with [facility].   Contact information:   [address] [place] [address] [phone]      Follow up with [name], MD. Schedule an appointment as soon as possible for a visit in 3 days.   Specialty:  Family Medicine   Contact information:   [address] [place] [address] [phone]       Follow up with Laqueta Linden, MD. Schedule an appointment as soon as possible for a visit in 1 week.   Specialty:  Cardiology   Contact information:   [address] [place] [address] [phone]       Follow up with [facility], MD. Schedule an appointment as soon as possible for a visit in 1 week.   Specialty:  Nephrology   Contact information:   [address] [place] [address] [phone]          Consults obtained - Urology, Cards    Discharge Medications      Medication List         albuterol (2.5 MG/3ML) 0.083% nebulizer solution  Commonly known as:  PROVENTIL  Take 3 mLs (2.5 mg total) by nebulization every 6 (six) hours as needed for wheezing or shortness of breath.     CENTRUM SILVER tablet  Take 1 tablet by mouth daily.     Cholecalciferol 1000 UNITS tablet  Take 1 tablet (1,000 Units total) by mouth  daily.     cycloSPORINE 0.05 % ophthalmic emulsion  Commonly known as:  RESTASIS  Place 1 drop into both eyes 2 (two) times daily.     digoxin 0.125 MG tablet  Commonly known as:  LANOXIN  Take 1 tablet (0.125 mg total) by mouth daily.     Fluticasone-Salmeterol 100-50 MCG/DOSE Aepb  Commonly known as:  ADVAIR  Inhale 1 puff into the lungs 2 (two) times daily.     furosemide 80 MG tablet  Commonly known as:  LASIX  Take 1 tablet (80 mg total) by mouth 2 (two) times daily. For 2 days start date 06/12/13     guaiFENesin 600 MG 12 hr tablet  Commonly known as:  MUCINEX  Take 600 mg by mouth 2 (two) times daily.     ipratropium 0.02 % nebulizer solution  Commonly known as:  ATROVENT  Take 0.5 mg by nebulization every 6 (six) hours as needed for wheezing or shortness of breath.     levothyroxine 100 MCG tablet  Commonly known as:  SYNTHROID, LEVOTHROID  Take 1 tablet (100 mcg total) by mouth daily before breakfast.     lovastatin 40 MG tablet  Commonly known as:  MEVACOR  Take 1 tablet (40 mg total) by mouth every other day. Patient takes on Sunday,Monday,Wednesday,Friday     metolazone 2.5 MG tablet  Commonly known as:  ZAROXOLYN  Take 1 tablet (2.5 mg total) by mouth once a week.     nystatin 100000 UNIT/GM Powd  Applied to affected skin to 3 times a day     omeprazole 40 MG capsule  Commonly known as:  PRILOSEC  Take 1 capsule (40 mg total) by mouth daily.     potassium chloride SA 20 MEQ tablet  Commonly known as:  K-DUR,KLOR-CON  Take 1 tablet (20 mEq total) by mouth daily. For 2 days 06/12/13 & 06/13/13     tamsulosin 0.4 MG Caps capsule  Commonly known as:  FLOMAX  Take 1 capsule (0.4 mg total) by mouth at bedtime.         Diet and Activity recommendation: See Discharge Instructions below   Discharge Instructions     Follow with Primary MD [name], MD in 7 days   Get CBC, CMP, TSH checked 7 days by Primary MD and again as instructed by your  Primary MD. Get a 2 view Chest X ray done next visit .   Activity: As tolerated with Full fall precautions use walker/cane & assistance as needed   Disposition Home     Diet:  Heart Healthy,  Fluid restriction 1.8 lit/day, Aspiration precautions.  For Heart failure patients - Check your Weight same time everyday, if you gain over 2 pounds, or you develop in leg swelling, experience more shortness of breath or chest pain, call your Primary MD immediately. Follow Cardiac Low Salt Diet and 1.8 lit/day fluid restriction.   On your next visit with her primary care physician please Get Medicines reviewed and adjusted.  Please request your Prim.MD to go over all Hospital Tests and Procedure/Radiological results at the follow up, please get all Hospital records sent to your Prim MD by signing hospital release before you go home.   If you experience worsening of your admission symptoms, develop shortness of breath, life threatening emergency, suicidal or homicidal thoughts you must seek medical attention immediately by calling 911 or calling your MD immediately  if symptoms less severe.  You Must read complete instructions/literature along with all the possible adverse reactions/side effects for all the Medicines you take and that have been prescribed to you. Take any new Medicines after you have completely understood and accpet all the possible adverse reactions/side effects.   Do not drive and provide baby sitting services if your were admitted for syncope or siezures until you have seen by Primary MD or a Neurologist and advised to  do so again.  Do not drive when taking Pain medications.    Do not take more than prescribed Pain, Sleep and Anxiety Medications  Special Instructions: If you have smoked or chewed Tobacco  in the last 2 yrs please stop smoking, stop any regular Alcohol  and or any Recreational drug use.  Wear Seat belts while driving.   Please note  You were cared for by a  hospitalist during your hospital stay. If you have any questions about your discharge medications or the care you received while you were in the hospital after you are discharged, you can call the unit and asked to speak with the hospitalist on call if the hospitalist that took care of you is not available. Once you are discharged, your primary care physician will handle any further medical issues. Please note that NO REFILLS for any discharge medications will be authorized once you are discharged, as it is imperative that you return to your primary care physician (or establish a relationship with a primary care physician if you do not have one) for your aftercare needs so that they can reassess your need for medications and monitor your lab values.    Major procedures and Radiology Reports - PLEASE review detailed and final reports for all details, in brief -       US Paracentesis  06/14/2013   CLINICAL DATA:  Ascites, question cirrhosis  EXAM: ULTRASOUND GUIDED PARACENTESIS  COMPARISON:  CT abdomen and pelvis 05/11/2013  PROCEDURE: Procedure, benefits, and risks of procedure were discussed with patient.  Written informed consent for procedure was obtained.  Time out protocol followed.  Adequate collection of ascites localized in left lower quadrant.  Skin prepped and draped in usual sterile fashion.  Skin and soft tissues anesthetized with 8 mL of 1% lidocaine.  5 [place] Yueh catheter placed into peritoneal cavity.  2300 mL of clear yellow fluid aspirated by vacuum bottle suction.  Procedure tolerated well by patient without immediate complication.  FINDINGS: As above.  IMPRESSION: Successful ultrasound guided paracentesis yielding 2300 mL of ascites.   Electronically Signed   By: Ulyses Southward M.D.   On: 06/14/2013 17:14   Dg Chest Port 1 View  06/13/2013   CLINICAL DATA:  CHF  EXAM: PORTABLE CHEST - 1 VIEW  COMPARISON:  the previous day's study  FINDINGS: Marked cardiomegaly. Previous median  sternotomy. Ectatic thoracic aorta. Pulmonary vascular congestion and probably mild interstitial edema, stable. Probable small pleural effusions. .  IMPRESSION: Stable cardiomegaly, mild edema and vascular congestion.   Electronically Signed   By: Oley Balm M.D.   On: 06/13/2013 16:38   Dg Chest Portable 1 View  06/12/2013   CLINICAL DATA:  Shortness breath, swelling  EXAM: PORTABLE CHEST - 1 VIEW  COMPARISON:  05/10/2013  FINDINGS: Cardiomegaly with mild interstitial edema. Moderate right and small left pleural effusions. Associated lower lobe opacities, likely atelectasis.  IMPRESSION: Cardiomegaly with mild interstitial edema.  Moderate right and small left pleural effusions.   Electronically Signed   By: Charline Bills M.D.   On: 06/12/2013 17:55    Micro Results      Recent Results (from the past 240 hour(s))  MRSA PCR SCREENING     Status: Abnormal   Collection Time    06/13/13  1:52 AM      Result Value Range Status   MRSA by PCR POSITIVE (*) NEGATIVE Final   Comment:            The  GeneXpert MRSA Assay (FDA     approved for NASAL specimens     only), is one component of a     comprehensive MRSA colonization     surveillance program. It is not     intended to diagnose MRSA     infection nor to guide or     monitor treatment for     MRSA infections.     RESULT CALLED TO, READ BACK BY AND VERIFIED WITH:      WALKER,L @ 0425 ON 06/13/13 BY WOODIE,J  URINE CULTURE     Status: None   Collection Time    06/13/13 11:08 AM      Result Value Range Status   Specimen Description URINE, CLEAN CATCH   Final   Special Requests NONE   Final   Culture  Setup Time     Final   Value: 06/14/2013 11:46     Performed at [facility] Count     Final   Value: >=100,000 COLONIES/ML     Performed at [facility]   Culture     Final   Value: ESCHERICHIA COLI     Performed at [facility]   Report Status 06/17/2013 FINAL   Final   Organism ID,  Bacteria ESCHERICHIA COLI   Final  GC/CHLAMYDIA PROBE AMP     Status: None   Collection Time    06/13/13  9:54 PM      Result Value Range Status   CT Probe RNA NEGATIVE  NEGATIVE Final   GC Probe RNA NEGATIVE  NEGATIVE Final   Comment: (NOTE)                                                                                               **Normal Reference Range: Negative**          Assay performed using the Gen-Probe APTIMA COMBO2 (R) Assay.     Acceptable specimen types for this assay include APTIMA Swabs (Unisex,     endocervical, urethral, or vaginal), first void urine, and ThinPrep     liquid based cytology samples.     Performed at [facility]  CULTURE, ROUTINE-GENITAL     Status: None   Collection Time    06/13/13 10:30 PM      Result Value Range Status   Specimen Description URETHRA   Final   Special Requests NONE   Final   Culture     Final   Value: MULTIPLE ORGANISMS PRESENT, NONE PREDOMINANT     Performed at [facility]   Report Status 06/16/2013 FINAL   Final     History of present illness and  Hospital Course:     Kindly see H&P for history of present illness and admission details, please review complete Labs, Consult reports and Test reports for all details in brief QUSAI KEM, is a 79 y.o. male, patient with history of  chronic combined systolic and diastolic CHF EF around 35%, mitral valve prolapse, hypertension, dyslipidemia, COPD, atrial fibrillation, spontaneous retroperitoneal bleed in the past, CAD, pulmonary hypertension due  to COPD, hypothyroidism.     Patient with above history was admitted hospital with massive edema anasarca secondary to acute on chronic combined systolic and diastolic CHF with EF of 35% along with right-sided heart failure due to pulmonary hypertension, he was treated her with IV Lasix drip and Zaroxolyn with good effect he is been diuresed about 5-6 L of fluid so far, his blood pressure is too low and renal function  is borderline to tolerate ACE/ARB, also he is bradycardic and cannot tolerate beta blocker. He was seen by cardiology he will be placed on oral Lasix along with once a week Zaroxolyn with close outpatient monitoring by PCP office BMP in weight. Note patient has poor baseline quality of life and long term prognosis is poor. This was discussed in detail with patient's power of attorney her daughter and he will be discharged with home hospice at this time. Note he has underlying moderate aortic stenosis for which is hard relatively contraindicated and we will advise on being cautious in sudden drops of preload.     For his massive ascites due to above dictated problem he underwent a therapeutic ultrasound-guided paracentesis with 2.5 L fluid removal.      He has underlying atrial fibrillation with episodes of bradycardia and was seen by cardiology for this also, she will be discharged home on digoxin. He cannot tolerate beta blocker due to episodes of severe bradycardia. He is a poor candidate for antiplatelet medications or anticoagulation due to history of spontaneous retroperitoneal bleed    He developed diaper rash due to incontinence along with some inflammation of the prepuce which is actually resolved with topical Lotrisone cream and nystatin powder.     Hypothyroidism was in poor control TSH was 26 Synthroid has been adjusted. We'll request PCP to repeat TSH, free T3 and T4 in about 4 weeks.    He developed mild acute renal insufficiency in the setting of CHF decompensation which is improving with diuresis outpatient monitoring of BMP in diuretic dose recommended. He also developed UTI which has been adequately treated.      Today   Subjective:   Sagan Maselli today has no headache,no chest abdominal pain,no new weakness tingling or numbness, feels much better wants to go home today.    Objective:   Blood pressure 92/70, pulse 77, temperature 97.5 F (36.4 C), temperature  source Oral, resp. rate 20, height 6\' 2"  (1.88 m), weight 110.224 kg (243 lb), SpO2 87.00%.   Intake/Output Summary (Last 24 hours) at 06/19/13 0833 Last data filed at 06/19/13 0543  Gross per 24 hour  Intake    720 ml  Output   1350 ml  Net   -630 ml    Exam Awake Alert, Oriented *3, No new F.N deficits, Normal affect [place].AT,PERRAL Supple Neck,No JVD, No cervical lymphadenopathy appriciated.  Symmetrical Chest wall movement, Good air movement bilaterally, CTAB RRR,No Gallops,Rubs or new Murmurs, No Parasternal Heave +ve B.Sounds, Abd Soft, Non tender, No organomegaly appriciated, No rebound -guarding or rigidity. No Cyanosis, Clubbing or edema, No new Rash or bruise  Data Review   CBC w Diff: Lab Results  Component Value Date   WBC 9.5 06/13/2013   HGB 13.3 06/13/2013   HCT 42.3 06/13/2013   PLT 137* 06/13/2013   LYMPHOPCT 9* 06/12/2013   BANDSPCT 0 05/08/2013   MONOPCT 10 06/12/2013   EOSPCT 1 06/12/2013   BASOPCT 0 06/12/2013    CMP: Lab Results  Component Value Date   NA 136  06/18/2013   K 3.6 06/18/2013   CL 92* 06/18/2013   CO2 36* 06/18/2013   BUN 35* 06/18/2013   CREATININE 1.51* 06/18/2013   CREATININE 0.83 12/17/2012   PROT 7.2 06/13/2013   ALBUMIN 3.4* 06/13/2013   BILITOT 1.1 06/13/2013   ALKPHOS 55 06/13/2013   AST 23 06/13/2013   ALT 10 06/13/2013  .   Total Time in preparing paper work, data evaluation and todays exam - 35 minutes  Leroy Sea M.D on 06/19/2013 at 8:33 AM  [facility] Office  [phone]

## 2013-06-21 ENCOUNTER — Telehealth: Payer: Self-pay

## 2013-06-21 ENCOUNTER — Encounter (HOSPITAL_COMMUNITY): Payer: Self-pay | Admitting: Emergency Medicine

## 2013-06-21 ENCOUNTER — Emergency Department (HOSPITAL_COMMUNITY): Payer: Medicare Other

## 2013-06-21 ENCOUNTER — Inpatient Hospital Stay (HOSPITAL_COMMUNITY)
Admission: EM | Admit: 2013-06-21 | Discharge: 2013-06-23 | DRG: 291 | Disposition: A | Discharge status: Hospice | Payer: Medicare Other | Attending: Family Medicine | Admitting: Family Medicine

## 2013-06-21 DIAGNOSIS — R4182 Altered mental status, unspecified: Secondary | ICD-10-CM

## 2013-06-21 DIAGNOSIS — Z9119 Patient's noncompliance with other medical treatment and regimen: Secondary | ICD-10-CM

## 2013-06-21 DIAGNOSIS — Z951 Presence of aortocoronary bypass graft: Secondary | ICD-10-CM

## 2013-06-21 DIAGNOSIS — T50995A Adverse effect of other drugs, medicaments and biological substances, initial encounter: Secondary | ICD-10-CM | POA: Diagnosis present

## 2013-06-21 DIAGNOSIS — H9190 Unspecified hearing loss, unspecified ear: Secondary | ICD-10-CM | POA: Diagnosis present

## 2013-06-21 DIAGNOSIS — Z9981 Dependence on supplemental oxygen: Secondary | ICD-10-CM

## 2013-06-21 DIAGNOSIS — I2789 Other specified pulmonary heart diseases: Secondary | ICD-10-CM | POA: Diagnosis present

## 2013-06-21 DIAGNOSIS — I350 Nonrheumatic aortic (valve) stenosis: Secondary | ICD-10-CM | POA: Diagnosis present

## 2013-06-21 DIAGNOSIS — I509 Heart failure, unspecified: Secondary | ICD-10-CM

## 2013-06-21 DIAGNOSIS — T460X1A Poisoning by cardiac-stimulant glycosides and drugs of similar action, accidental (unintentional), initial encounter: Secondary | ICD-10-CM

## 2013-06-21 DIAGNOSIS — G92 Toxic encephalopathy: Secondary | ICD-10-CM | POA: Diagnosis present

## 2013-06-21 DIAGNOSIS — J9602 Acute respiratory failure with hypercapnia: Secondary | ICD-10-CM

## 2013-06-21 DIAGNOSIS — N179 Acute kidney failure, unspecified: Secondary | ICD-10-CM | POA: Diagnosis present

## 2013-06-21 DIAGNOSIS — I2510 Atherosclerotic heart disease of native coronary artery without angina pectoris: Secondary | ICD-10-CM | POA: Diagnosis present

## 2013-06-21 DIAGNOSIS — N183 Chronic kidney disease, stage 3 unspecified: Secondary | ICD-10-CM | POA: Diagnosis present

## 2013-06-21 DIAGNOSIS — Z8249 Family history of ischemic heart disease and other diseases of the circulatory system: Secondary | ICD-10-CM

## 2013-06-21 DIAGNOSIS — Z823 Family history of stroke: Secondary | ICD-10-CM

## 2013-06-21 DIAGNOSIS — N289 Disorder of kidney and ureter, unspecified: Secondary | ICD-10-CM

## 2013-06-21 DIAGNOSIS — Z66 Do not resuscitate: Secondary | ICD-10-CM | POA: Diagnosis present

## 2013-06-21 DIAGNOSIS — I059 Rheumatic mitral valve disease, unspecified: Secondary | ICD-10-CM | POA: Diagnosis present

## 2013-06-21 DIAGNOSIS — T460X5A Adverse effect of cardiac-stimulant glycosides and drugs of similar action, initial encounter: Secondary | ICD-10-CM | POA: Diagnosis present

## 2013-06-21 DIAGNOSIS — I5033 Acute on chronic diastolic (congestive) heart failure: Secondary | ICD-10-CM

## 2013-06-21 DIAGNOSIS — Z515 Encounter for palliative care: Secondary | ICD-10-CM

## 2013-06-21 DIAGNOSIS — I359 Nonrheumatic aortic valve disorder, unspecified: Secondary | ICD-10-CM | POA: Diagnosis present

## 2013-06-21 DIAGNOSIS — G9340 Encephalopathy, unspecified: Secondary | ICD-10-CM

## 2013-06-21 DIAGNOSIS — I4891 Unspecified atrial fibrillation: Secondary | ICD-10-CM | POA: Diagnosis present

## 2013-06-21 DIAGNOSIS — I5023 Acute on chronic systolic (congestive) heart failure: Secondary | ICD-10-CM

## 2013-06-21 DIAGNOSIS — I129 Hypertensive chronic kidney disease with stage 1 through stage 4 chronic kidney disease, or unspecified chronic kidney disease: Secondary | ICD-10-CM | POA: Diagnosis present

## 2013-06-21 DIAGNOSIS — Z91199 Patient's noncompliance with other medical treatment and regimen due to unspecified reason: Secondary | ICD-10-CM

## 2013-06-21 DIAGNOSIS — J449 Chronic obstructive pulmonary disease, unspecified: Secondary | ICD-10-CM | POA: Diagnosis present

## 2013-06-21 DIAGNOSIS — I252 Old myocardial infarction: Secondary | ICD-10-CM

## 2013-06-21 DIAGNOSIS — M1990 Unspecified osteoarthritis, unspecified site: Secondary | ICD-10-CM | POA: Diagnosis present

## 2013-06-21 DIAGNOSIS — Z954 Presence of other heart-valve replacement: Secondary | ICD-10-CM

## 2013-06-21 DIAGNOSIS — I5043 Acute on chronic combined systolic (congestive) and diastolic (congestive) heart failure: Secondary | ICD-10-CM

## 2013-06-21 DIAGNOSIS — E785 Hyperlipidemia, unspecified: Secondary | ICD-10-CM | POA: Diagnosis present

## 2013-06-21 DIAGNOSIS — Z8546 Personal history of malignant neoplasm of prostate: Secondary | ICD-10-CM

## 2013-06-21 DIAGNOSIS — J9600 Acute respiratory failure, unspecified whether with hypoxia or hypercapnia: Secondary | ICD-10-CM

## 2013-06-21 DIAGNOSIS — E119 Type 2 diabetes mellitus without complications: Secondary | ICD-10-CM | POA: Diagnosis present

## 2013-06-21 DIAGNOSIS — K219 Gastro-esophageal reflux disease without esophagitis: Secondary | ICD-10-CM | POA: Diagnosis present

## 2013-06-21 DIAGNOSIS — J9620 Acute and chronic respiratory failure, unspecified whether with hypoxia or hypercapnia: Secondary | ICD-10-CM | POA: Diagnosis present

## 2013-06-21 DIAGNOSIS — G929 Unspecified toxic encephalopathy: Secondary | ICD-10-CM | POA: Diagnosis present

## 2013-06-21 DIAGNOSIS — Z87891 Personal history of nicotine dependence: Secondary | ICD-10-CM

## 2013-06-21 DIAGNOSIS — E039 Hypothyroidism, unspecified: Secondary | ICD-10-CM | POA: Diagnosis present

## 2013-06-21 DIAGNOSIS — J4489 Other specified chronic obstructive pulmonary disease: Secondary | ICD-10-CM | POA: Diagnosis present

## 2013-06-21 MED ORDER — IPRATROPIUM BROMIDE 0.02 % IN SOLN
0.5000 mg | Freq: Once | RESPIRATORY_TRACT | Status: AC
  Administered 2013-06-21: 0.5 mg via RESPIRATORY_TRACT
  Filled 2013-06-21: qty 2.5

## 2013-06-21 MED ORDER — ALBUTEROL SULFATE (2.5 MG/3ML) 0.083% IN NEBU
2.5000 mg | INHALATION_SOLUTION | Freq: Once | RESPIRATORY_TRACT | Status: AC
  Administered 2013-06-21: 2.5 mg via RESPIRATORY_TRACT
  Filled 2013-06-21: qty 3

## 2013-06-21 NOTE — ED Notes (Signed)
Patient presents to ER via [facility] with c/o shortness of breath.  Per family, patient has been short of breath x 1 hour.

## 2013-06-21 NOTE — ED Provider Notes (Signed)
CSN: [id number]     Arrival date & time 06/21/13  2301 History  This chart was scribed for Joya Gaskins, MD by Karle Plumber, ED Scribe. This patient was seen in room APA06/[facility]06 and the patient's care was started at 11:50 PM.  Chief Complaint  Patient presents with  . Shortness of Breath   The history is provided by the patient and a relative. The history is limited by the condition of the patient. No language interpreter was used.   HPI Comments:  [name] is a 79 y.o. male brought in by ambulance, who presents to the Emergency Department complaining of worsening SOB. His daughter decreased appetite, aggitated and confusion. Daughter reports he was minimally responsive at home and would not follow commands. She reports him being here at the hospital and discharged last weekend. He has not had pain, vomiting, or fever.  His course is worsening Nothing improves his symptoms   Past Medical History  Diagnosis Date  . Mitral valve prolapse 2003    MVR in 2003  . Obesity   . Osteoarthritis   . Hypertension   . Hyperlipidemia   . COPD (chronic obstructive pulmonary disease)   . CHF (congestive heart failure) diastolic 10/2010    CXR in 12/2010: Prior CABG, cardiomegaly, vascular redistribution, bibasilar atelectasis, small effusions  . Pneumonia   . Impaired glucose tolerance   . Atrial fibrillation, chronic   . Prostate cancer     elevated PSA  . GERD (gastroesophageal reflux disease)     + hiatal hernia  . Arteriosclerotic cardiovascular disease (ASCVD) 1994    stent to RCA; CABG-2003  . Hard of hearing   . Anemia, iron deficiency   . AAA (abdominal aortic aneurysm)     Fusiform; infrarenal; 4-4.1cm on CT in 07/2008 and 12/2010 by MRI  . Gastric ulcer 2004    2004; upper GI bleed  . Renal cysts, acquired, bilateral     Complex by MRI in 12/2010  . Adenomatous polyps 04/07/2012  . HYPERTENSION 12/17/2007    Subsequently hypotensive and all antihypertensive  medication discontinued Lab  03/2012: Mild anemia with H&H-11.8/37.5, MCV-97, ferritin-224, normal CMet ex G-124, alb-3.4   . NSTEMI (non-ST elevated myocardial infarction) 01/18/2011  . Diabetes mellitus, type II 07/13/12    family denies patient is diabetic  . Macrocytosis without anemia 08/13/2012  . Acute systolic congestive heart failure 08/15/2012    EF 35-40%, per Echo  . Aortic stenosis, moderate 08/15/2012  . Pulmonary hypertension due to COPD 08/15/2012    53 mm Hg  . Tricuspid valve regurgitation 08/15/2012  . Retroperitoneal bleed 06/2012  . On home O2   . Left leg swelling 03/2013  . Clostridium difficile colitis 03/2013  . Unspecified hypothyroidism 05/11/2013    Newly diagnosed.   Past Surgical History  Procedure Laterality Date  . Knee surgery      Left  . Bladder surgery    . Femoral artery stent  12-06-10    Left SFA  . Appendectomy    . Mitral valve replacement (mvr)/coronary artery bypass grafting (cabg)  2003    stent to RCA in 1994  . Eye surgery  2007    bilateral cataracts  . Tonsillectomy      thinks they were removed while in the [employer]  . Mass excision  06/07/2011    Procedure: EXCISION MASS;  Surgeon: Fabio Bering;  Location: [facility];  Service: General;  Laterality: N/A;  excision of 2 masses back  and buttocks  . Back surgery    . Esophagogastroduodenoscopy  03/03/2003    Large, deep prepyloric ulcer, as described above without bleeding/ Normal esophagus  . Esophagogastroduodenoscopy  09/03/2004    normal throughout  . Colonoscopy  09/03/2004    small ulcer, without stigmata of bleeding  . Esophagogastroduodenoscopy  11/14/2004    Normal esophagus and small hiatal hernia, otherwise normal stomach   . Colonoscopy  11/14/2004    Internal hemorrhoids, otherwise normal rectum/ left-sided diverticula , diffusely oozing right colon mucosa without a discrete lesion amenable to endoscopic therapy. 2 diminutive polyps. FELT TO HAVE AVMs/telangiectasias  .  Colonoscopy  01/29/2005    Normal rectum  . Colonoscopy  03/11/2012    Colonic diverticulosis. Colonic polyps-removed as described above. Vascular anomalies in the cecum likely representing hemangiomas. Status post hemostasis clipping of  2 of the 3. ADENOMATOUS POLYPS. Repeat 2016  . Esophagogastroduodenoscopy  03/11/2012    Deformity of the antrum;  small polyp in antrum-not manipulated. Otherwise normal exam   Family History  Problem Relation Age of Onset  . Stroke Mother   . Stroke Brother   . Heart attack Brother   . Cancer Brother   . Cancer Brother   . Anesthesia problems Neg Hx   . Hypotension Neg Hx   . Malignant hyperthermia Neg Hx   . Pseudochol deficiency Neg Hx    History  Substance Use Topics  . Smoking status: Former Smoker -- 1.00 packs/day for 70 years    Types: Cigarettes    Quit date: 06/25/2007  . Smokeless tobacco: Former Neurosurgeon  . Alcohol Use: No    Review of Systems  Unable to perform ROS: Mental status change  Constitutional: Positive for appetite change.  Respiratory: Positive for shortness of breath.   Psychiatric/Behavioral: Positive for agitation.    Allergies  Aspirin; Gabapentin; and Naproxen  Home Medications   Current Outpatient Rx  Name  Route  Sig  Dispense  Refill  . albuterol (PROVENTIL) (2.5 MG/3ML) 0.083% nebulizer solution   Nebulization   Take 3 mLs (2.5 mg total) by nebulization every 6 (six) hours as needed for wheezing or shortness of breath.   75 mL   12   . Cholecalciferol 1000 UNITS tablet   Oral   Take 1 tablet (1,000 Units total) by mouth daily.   30 tablet   0   . cycloSPORINE (RESTASIS) 0.05 % ophthalmic emulsion   Both Eyes   Place 1 drop into both eyes 2 (two) times daily.   0.4 mL   1   . digoxin (LANOXIN) 0.125 MG tablet   Oral   Take 1 tablet (0.125 mg total) by mouth daily.   30 tablet   0   . Fluticasone-Salmeterol (ADVAIR) 100-50 MCG/DOSE AEPB   Inhalation   Inhale 1 puff into the lungs 2 (two)  times daily.   60 each   1   . furosemide (LASIX) 80 MG tablet   Oral   Take 1 tablet (80 mg total) by mouth 2 (two) times daily. For 2 days start date 06/12/13   60 tablet   0   . guaiFENesin (MUCINEX) 600 MG 12 hr tablet   Oral   Take 600 mg by mouth 2 (two) times daily.         Marland Kitchen ipratropium (ATROVENT) 0.02 % nebulizer solution   Nebulization   Take 0.5 mg by nebulization every 6 (six) hours as needed for wheezing or shortness of breath.         Marland Kitchen  levothyroxine (SYNTHROID, LEVOTHROID) 100 MCG tablet   Oral   Take 1 tablet (100 mcg total) by mouth daily before breakfast.   30 tablet   0   . lovastatin (MEVACOR) 40 MG tablet   Oral   Take 1 tablet (40 mg total) by mouth every other day. Patient takes on Sunday,Monday,Wednesday,Friday   30 tablet   0   . metolazone (ZAROXOLYN) 2.5 MG tablet   Oral   Take 1 tablet (2.5 mg total) by mouth once a week.   7 tablet   0   . Multiple Vitamins-Minerals (CENTRUM SILVER) tablet   Oral   Take 1 tablet by mouth daily.   30 tablet   0   . nystatin (MYCOSTATIN/NYSTOP) 100000 UNIT/GM POWD      Applied to affected skin to 3 times a day   30 g   0   . omeprazole (PRILOSEC) 40 MG capsule   Oral   Take 1 capsule (40 mg total) by mouth daily.   30 capsule   0   . potassium chloride SA (K-DUR,KLOR-CON) 20 MEQ tablet   Oral   Take 1 tablet (20 mEq total) by mouth daily. For 2 days 06/12/13 & 06/13/13   60 tablet   0   . tamsulosin (FLOMAX) 0.4 MG CAPS capsule   Oral   Take 1 capsule (0.4 mg total) by mouth at bedtime.   30 capsule   1    Triage Vitals: BP 104/81  Pulse 77  Temp(Src) 98.2 F (36.8 C)  Resp 22  Wt 243 lb (110.224 kg)  SpO2 97% Physical Exam CONSTITUTIONAL: elderly, frail HEAD: Normocephalic/atraumatic EYES: EOMI/PERRL ENMT: Mucous membranes moist NECK: supple no meningeal signs SPINE:entire spine nontender CV: murmur noted LUNGS: Lungs are clear to auscultation bilaterally, no apparent  distress ABDOMEN: soft, nontender, no rebound or guarding; obese; reducible umbilical hernia GU:no cva tenderness NEURO: Pt is awake/alert, moves all extremities x 4; somnolent but able to arouse; follows commands EXTREMITIES: pulses normal, full ROM SKIN: warm, color normal PSYCH: no abnormalities of mood noted  ED Course  Procedures (including critical care time) DIAGNOSTIC STUDIES: Oxygen Saturation is 97% on 3 L/min, normal by my interpretation.   COORDINATION OF CARE: 11:54 PM- Will obtain blood work. Pt verbalizes understanding and agrees to plan.  1:49 AM Pt found to have elevated digoxin level.  Nurse spoke to [facility], recommends monitoring, stop digoxin.  At this point does not recommend aggressive hydration and no antidote at this time I suspect dehydration and dig toxicity playing role in altered mental status Pt is also in CHF Per daughter, pt has been on hospice previously, but she reports pt will insist he is full code when he is fully awake Pt stabilized in the ED D/w dr Jerral Ralph, will admit to tele for monitoring   Medications  albuterol (PROVENTIL) (2.5 MG/3ML) 0.083% nebulizer solution 2.5 mg (2.5 mg Nebulization Given 06/21/13 2326)  ipratropium (ATROVENT) nebulizer solution 0.5 mg (0.5 mg Nebulization Given 06/21/13 2326)    Labs Review Labs Reviewed  CBC WITH DIFFERENTIAL  BASIC METABOLIC PANEL  PRO B NATRIURETIC PEPTIDE  DIGOXIN LEVEL   Imaging Review No results found.  EKG Interpretation    Date/Time:  Monday June 21 2013 23:03:03 EST Ventricular Rate:  76 PR Interval:    QRS Duration: 130 QT Interval:  402 QTC Calculation: 452 R Axis:   104 Text Interpretation:  Atrial fibrillation Non-specific intra-ventricular conduction block Possible Anterolateral infarct (cited on or before 08-May-2013)  Abnormal ECG When compared with ECG of 13-Jun-2013 17:01, Questionable change in initial forces of Lateral leads Confirmed by Bebe Shaggy  MD, Cordarrell Sane [phone]) on  06/21/2013 11:36:29 PM            MDM  No diagnosis found. Nursing notes including past medical history and social history reviewed and considered in documentation xrays reviewed and considered Labs/vital reviewed and considered   I personally performed the services described in this documentation, which was scribed in my presence. The recorded information has been reviewed and is accurate.      Joya Gaskins, MD 06/22/13 [phone]

## 2013-06-21 NOTE — Progress Notes (Signed)
UR chart review completed.  

## 2013-06-21 NOTE — Telephone Encounter (Signed)
Noted, thank you

## 2013-06-22 DIAGNOSIS — T460X5A Adverse effect of cardiac-stimulant glycosides and drugs of similar action, initial encounter: Secondary | ICD-10-CM

## 2013-06-22 DIAGNOSIS — J9600 Acute respiratory failure, unspecified whether with hypoxia or hypercapnia: Secondary | ICD-10-CM

## 2013-06-22 DIAGNOSIS — I509 Heart failure, unspecified: Secondary | ICD-10-CM

## 2013-06-22 DIAGNOSIS — T460X1A Poisoning by cardiac-stimulant glycosides and drugs of similar action, accidental (unintentional), initial encounter: Secondary | ICD-10-CM

## 2013-06-22 DIAGNOSIS — I5043 Acute on chronic combined systolic (congestive) and diastolic (congestive) heart failure: Principal | ICD-10-CM

## 2013-06-22 DIAGNOSIS — G9340 Encephalopathy, unspecified: Secondary | ICD-10-CM

## 2013-06-22 DIAGNOSIS — I5023 Acute on chronic systolic (congestive) heart failure: Secondary | ICD-10-CM

## 2013-06-22 DIAGNOSIS — I5033 Acute on chronic diastolic (congestive) heart failure: Secondary | ICD-10-CM

## 2013-06-22 LAB — URINE MICROSCOPIC-ADD ON

## 2013-06-22 LAB — BASIC METABOLIC PANEL
BUN: 52 mg/dL — ABNORMAL HIGH (ref 6–23)
Chloride: 89 mEq/L — ABNORMAL LOW (ref 96–112)
GFR calc Af Amer: 41 mL/min — ABNORMAL LOW (ref >90)
GFR calc non Af Amer: 35 mL/min — ABNORMAL LOW (ref >90)
Potassium: 4.9 mEq/L (ref 3.7–5.3)
Sodium: 133 mEq/L — ABNORMAL LOW (ref 137–147)

## 2013-06-22 LAB — BLOOD GAS, ARTERIAL
Acid-Base Excess: 6.1 mmol/L — ABNORMAL HIGH (ref 0.0–2.0)
Drawn by: 38235
FIO2: 0.5 %
O2 Saturation: 91.4 %
Patient temperature: 37
pCO2 arterial: 62 mmHg (ref 35.0–45.0)
pO2, Arterial: 69.9 mmHg — ABNORMAL LOW (ref 80.0–100.0)

## 2013-06-22 LAB — DIGOXIN LEVEL
Digoxin Level: 2.3 ng/mL — ABNORMAL HIGH (ref 0.8–2.0)
Digoxin Level: 2.4 ng/mL — ABNORMAL HIGH (ref 0.8–2.0)

## 2013-06-22 LAB — PRO B NATRIURETIC PEPTIDE: Pro B Natriuretic peptide (BNP): 15443 pg/mL — ABNORMAL HIGH (ref 0–450)

## 2013-06-22 LAB — CBC WITH DIFFERENTIAL/PLATELET
Basophils Absolute: 0.1 10*3/uL (ref 0.0–0.1)
Eosinophils Relative: 0 % (ref 0–5)
Lymphocytes Relative: 7 % — ABNORMAL LOW (ref 12–46)
Lymphs Abs: 0.5 10*3/uL — ABNORMAL LOW (ref 0.7–4.0)
MCH: 32.3 pg (ref 26.0–34.0)
MCV: 101.4 fL — ABNORMAL HIGH (ref 78.0–100.0)
Monocytes Relative: 17 % — ABNORMAL HIGH (ref 3–12)
Neutrophils Relative %: 75 % (ref 43–77)
Platelets: 125 10*3/uL — ABNORMAL LOW (ref 150–400)
RBC: 4.31 MIL/uL (ref 4.22–5.81)
RDW: 16 % — ABNORMAL HIGH (ref 11.5–15.5)
WBC: 7.4 10*3/uL (ref 4.0–10.5)

## 2013-06-22 LAB — COMPREHENSIVE METABOLIC PANEL
ALT: 13 U/L (ref 0–53)
AST: 35 U/L (ref 0–37)
Albumin: 3.3 g/dL — ABNORMAL LOW (ref 3.5–5.2)
Alkaline Phosphatase: 50 U/L (ref 39–117)
BUN: 53 mg/dL — ABNORMAL HIGH (ref 6–23)
Chloride: 91 mEq/L — ABNORMAL LOW (ref 96–112)
Creatinine, Ser: 1.69 mg/dL — ABNORMAL HIGH (ref 0.50–1.35)
GFR calc non Af Amer: 34 mL/min — ABNORMAL LOW (ref >90)
Potassium: 4.8 mEq/L (ref 3.7–5.3)
Total Bilirubin: 0.7 mg/dL (ref 0.3–1.2)
Total Protein: 7.4 g/dL (ref 6.0–8.3)

## 2013-06-22 LAB — CBC
HCT: 42.5 % (ref 39.0–52.0)
MCHC: 31.8 g/dL (ref 30.0–36.0)
Platelets: 122 10*3/uL — ABNORMAL LOW (ref 150–400)
RDW: 16.1 % — ABNORMAL HIGH (ref 11.5–15.5)

## 2013-06-22 LAB — URINALYSIS, ROUTINE W REFLEX MICROSCOPIC
Glucose, UA: NEGATIVE mg/dL
Nitrite: NEGATIVE
Protein, ur: 30 mg/dL — AB
Specific Gravity, Urine: >1.03 — ABNORMAL HIGH (ref 1.005–1.030)
pH: 5 (ref 5.0–8.0)

## 2013-06-22 MED ORDER — PANTOPRAZOLE SODIUM 40 MG PO TBEC
40.0000 mg | DELAYED_RELEASE_TABLET | Freq: Every day | ORAL | Status: DC
  Filled 2013-06-22: qty 1

## 2013-06-22 MED ORDER — SIMVASTATIN 20 MG PO TABS: 20.0000 mg | ORAL_TABLET | Freq: Every day | ORAL | Status: DC

## 2013-06-22 MED ORDER — CYCLOSPORINE 0.05 % OP EMUL
1.0000 [drp] | Freq: Two times a day (BID) | OPHTHALMIC | Status: DC
  Administered 2013-06-22 – 2013-06-23 (×2): 1 [drp] via OPHTHALMIC
  Filled 2013-06-22 (×8): qty 1

## 2013-06-22 MED ORDER — FUROSEMIDE 10 MG/ML IJ SOLN
40.0000 mg | Freq: Two times a day (BID) | INTRAMUSCULAR | Status: DC
  Administered 2013-06-22 (×2): 40 mg via INTRAVENOUS
  Filled 2013-06-22 (×2): qty 4

## 2013-06-22 MED ORDER — LORAZEPAM 2 MG/ML IJ SOLN
1.0000 mg | INTRAMUSCULAR | Status: DC | PRN
  Administered 2013-06-22 – 2013-06-23 (×3): 1 mg via INTRAVENOUS
  Filled 2013-06-22 (×3): qty 1

## 2013-06-22 MED ORDER — SODIUM CHLORIDE 0.9 % IV SOLN
INTRAVENOUS | Status: DC
  Administered 2013-06-22: 20 mL/h via INTRAVENOUS

## 2013-06-22 MED ORDER — IPRATROPIUM-ALBUTEROL 0.5-2.5 (3) MG/3ML IN SOLN
3.0000 mL | Freq: Four times a day (QID) | RESPIRATORY_TRACT | Status: DC
  Administered 2013-06-22 – 2013-06-23 (×7): 3 mL via RESPIRATORY_TRACT
  Filled 2013-06-22 (×8): qty 3

## 2013-06-22 MED ORDER — IPRATROPIUM BROMIDE 0.02 % IN SOLN: 0.5000 mg | Freq: Four times a day (QID) | RESPIRATORY_TRACT | Status: DC

## 2013-06-22 MED ORDER — LEVOTHYROXINE SODIUM 100 MCG PO TABS: 100.0000 ug | ORAL_TABLET | Freq: Every day | ORAL | Status: DC

## 2013-06-22 MED ORDER — OXYCODONE HCL 5 MG PO TABS: 5.0000 mg | ORAL_TABLET | ORAL | Status: DC | PRN

## 2013-06-22 MED ORDER — HEPARIN SODIUM (PORCINE) 5000 UNIT/ML IJ SOLN
5000.0000 [IU] | Freq: Three times a day (TID) | INTRAMUSCULAR | Status: DC
  Administered 2013-06-22 (×2): 5000 [IU] via SUBCUTANEOUS
  Filled 2013-06-22 (×3): qty 1

## 2013-06-22 MED ORDER — TAMSULOSIN HCL 0.4 MG PO CAPS
0.4000 mg | ORAL_CAPSULE | Freq: Every day | ORAL | Status: DC
  Filled 2013-06-22: qty 1

## 2013-06-22 MED ORDER — ACETAMINOPHEN 650 MG RE SUPP: 650.0000 mg | Freq: Four times a day (QID) | RECTAL | Status: DC | PRN

## 2013-06-22 MED ORDER — ONDANSETRON HCL 4 MG/2ML IJ SOLN: 4.0000 mg | Freq: Four times a day (QID) | INTRAMUSCULAR | Status: DC | PRN

## 2013-06-22 MED ORDER — ACETAMINOPHEN 325 MG PO TABS: 650.0000 mg | ORAL_TABLET | Freq: Four times a day (QID) | ORAL | Status: DC | PRN

## 2013-06-22 MED ORDER — ALBUTEROL SULFATE (2.5 MG/3ML) 0.083% IN NEBU: 2.5000 mg | INHALATION_SOLUTION | RESPIRATORY_TRACT | Status: DC | PRN

## 2013-06-22 MED ORDER — POTASSIUM CHLORIDE CRYS ER 20 MEQ PO TBCR: 20.0000 meq | EXTENDED_RELEASE_TABLET | Freq: Every day | ORAL | Status: DC

## 2013-06-22 MED ORDER — MORPHINE SULFATE 2 MG/ML IJ SOLN
1.0000 mg | INTRAMUSCULAR | Status: DC | PRN
  Administered 2013-06-22: 1 mg via INTRAVENOUS
  Filled 2013-06-22: qty 1

## 2013-06-22 MED ORDER — SODIUM CHLORIDE 0.9 % IJ SOLN
3.0000 mL | Freq: Two times a day (BID) | INTRAMUSCULAR | Status: DC
  Administered 2013-06-22 – 2013-06-23 (×4): 3 mL via INTRAVENOUS

## 2013-06-22 MED ORDER — ONDANSETRON HCL 4 MG PO TABS: 4.0000 mg | ORAL_TABLET | Freq: Four times a day (QID) | ORAL | Status: DC | PRN

## 2013-06-22 MED ORDER — ALBUTEROL SULFATE (2.5 MG/3ML) 0.083% IN NEBU: 2.5000 mg | INHALATION_SOLUTION | Freq: Four times a day (QID) | RESPIRATORY_TRACT | Status: DC

## 2013-06-22 MED ORDER — LORAZEPAM 2 MG/ML IJ SOLN
1.0000 mg | Freq: Once | INTRAMUSCULAR | Status: AC
  Administered 2013-06-22: 1 mg via INTRAVENOUS
  Filled 2013-06-22: qty 1

## 2013-06-22 MED ORDER — FUROSEMIDE 10 MG/ML IJ SOLN
60.0000 mg | Freq: Once | INTRAMUSCULAR | Status: AC
  Administered 2013-06-22: 60 mg via INTRAVENOUS
  Filled 2013-06-22: qty 6

## 2013-06-22 NOTE — Clinical Social Work Psychosocial (Signed)
Clinical Social Work Department BRIEF PSYCHOSOCIAL ASSESSMENT 06/22/2013  Patient:  [name], [name]     Account Number:  [id number]     Admit date:  06/21/2013  Clinical Social Worker:  Nancie Neas  Date/Time:  06/22/2013 04:00 PM  Referred by:  Physician  Date Referred:  06/22/2013 Referred for  Residential hospice placement   Other Referral:   Interview type:  Family Other interview type:   [name]- daughter    PSYCHOSOCIAL DATA Living Status:  WITH ADULT CHILDREN Admitted from facility:   Level of care:   Primary support name:  [name] Primary support relationship to patient:  CHILD, ADULT Degree of support available:   very supportive    CURRENT CONCERNS Current Concerns  Post-Acute Placement   Other Concerns:    SOCIAL WORK ASSESSMENT / PLAN CSW met with pt's daughter at bedside. Pt very well known to CSW from previous admissions. He was recently d/c home after daughter decided to take him back home instead of return to SNF. Since he left, [name] reports pt has not eaten and took very little liquids. His sats dropped yesterday and pt came back to ED. [name] had called [facility] to see if pt could be admitted there from home and [facility] called CSW today about him. After discussion with MD, [name] is in agreement with [facility]. Pt has had hospice in the past and they stopped services as he no longer met criteria. CSW faxed referral to [facility]. Possible d/c tomorrow. CSW notified [facility] of change in plans with daughter's permission.   Assessment/plan status:  Referral to [facility] Other assessment/ plan:   Information/referral to community resources:   [facility]    PATIENT'S/FAMILY'S RESPONSE TO PLAN OF CARE: Pt unable to discuss plan of care. Daughter very tearful during assessment. Support provided. Will follow up tomorrow.       [name], [place] [phone]

## 2013-06-22 NOTE — ED Notes (Signed)
CRITICAL VALUE ALERT  Critical value received:  CO2 - 62.0  Date of notification:  06/22/13  Time of notification:  1216  Critical value read back: yes  Nurse who received alert:  Josph Macho RN  MD notified (1st page):  Dr Bebe Shaggy  Time of first page:  1216  MD notified (2nd page):  Time of second page:  Responding MD:  Dr Bebe Shaggy  Time MD responded:  1216

## 2013-06-22 NOTE — Progress Notes (Signed)
UR chart review completed.  

## 2013-06-22 NOTE — Progress Notes (Addendum)
INITIAL NUTRITION ASSESSMENT  DOCUMENTATION CODES Per approved criteria  -Not Applicable   INTERVENTION: Follow pt response to treatment, diet advancement and add protein modular if appropriate  NUTRITION DIAGNOSIS: Inadequate oral intake related to inability to eat as evidenced by NPO status.  Goal: Meet pt nutrition needs as able.   Monitor:  Po intake, labs, skin assessments, wt trends and changes in status  Reason for Assessment: Braden Score=11  79 y.o. male  Admitting Dx: Digoxin toxicity  ASSESSMENT: RD pulled to pt due to Low Braden score which indicates he is high risk for skin breakdown. He has 5 hospital admissions in the past 6 months. Hx includes chronic heart failure (EF 35%), CAD, atrial fibrillation. Currently problems include but not limited to acute encephalopathy, digoxin toxicity, acute on chronic CHF, acute on chronic renal failure (stage III). Pt is a DNR. At risk for malnutrition given his acute and chronic illnesses and poor prognosis.   Height: Ht Readings from Last 1 Encounters:  06/22/13 6\' 3"  (1.905 m)    Weight: Wt Readings from Last 1 Encounters:  06/22/13 237 lb 14 oz (107.9 kg)    Ideal Body Weight: 196# (89 kg)  % Ideal Body Weight: 121%  Wt Readings from Last 10 Encounters:  06/22/13 237 lb 14 oz (107.9 kg)  06/16/13 243 lb (110.224 kg)  05/13/13 174 lb 9.7 oz (79.2 kg)  04/13/13 228 lb 9.9 oz (103.7 kg)  04/04/13 236 lb 15.9 oz (107.5 kg)  03/05/13 234 lb 1.6 oz (106.187 kg)  12/23/12 226 lb (102.513 kg)  09/10/12 224 lb (101.606 kg)  08/31/12 214 lb 9.6 oz (97.342 kg)  08/18/12 231 lb 11.3 oz (105.1 kg)    Usual Body Weight: 225-235#  % Usual Body Weight: 101%  BMI:  Body mass index is 29.73 kg/(m^2).overweight  Estimated Nutritional Needs: Kcal: 4098-1191 Protein: 107-116 gr Fluid: per MD goals (pt on Lasix)  Skin: none noted this admission  Diet Order: NPO  EDUCATION NEEDS: -Education not appropriate at this  time   Intake/Output Summary (Last 24 hours) at 06/22/13 0927 Last data filed at 06/22/13 0600  Gross per 24 hour  Intake     38 ml  Output     50 ml  Net    -12 ml    Last BM:  06/22/13  Labs:   Recent Labs Lab 06/16/13 0502 06/17/13 0602 06/18/13 0517 06/21/13 2358 06/22/13 0510  NA 136 138 136 133* 136*  K 3.6 3.4* 3.6 4.9 4.8  CL 94* 92* 92* 89* 91*  CO2 34* 33* 36* 33* 33*  BUN 36* 35* 35* 52* 53*  CREATININE 1.43* 1.44* 1.51* 1.65* 1.69*  CALCIUM 8.2* 8.6 8.2* 9.0 8.8  MG 2.0 2.0 1.9  --   --   GLUCOSE 110* 91 85 113* 100*    CBG (last 3)   Recent Labs  06/22/13 0758  GLUCAP 79    Scheduled Meds: . cycloSPORINE  1 drop Both Eyes BID  . furosemide  40 mg Intravenous BID  . heparin  5,000 Units Subcutaneous Q8H  . ipratropium-albuterol  3 mL Nebulization Q6H  . levothyroxine  100 mcg Oral QAC breakfast  . pantoprazole  40 mg Oral Daily  . potassium chloride SA  20 mEq Oral Daily  . simvastatin  20 mg Oral q1800  . sodium chloride  3 mL Intravenous Q12H  . tamsulosin  0.4 mg Oral QHS    Continuous Infusions: . sodium chloride 20 mL/hr (06/22/13 0406)  Past Medical History  Diagnosis Date  . Mitral valve prolapse 2003    MVR in 2003  . Obesity   . Osteoarthritis   . Hypertension   . Hyperlipidemia   . COPD (chronic obstructive pulmonary disease)   . CHF (congestive heart failure) diastolic 10/2010    CXR in 12/2010: Prior CABG, cardiomegaly, vascular redistribution, bibasilar atelectasis, small effusions  . Pneumonia   . Impaired glucose tolerance   . Atrial fibrillation, chronic   . Prostate cancer     elevated PSA  . GERD (gastroesophageal reflux disease)     + hiatal hernia  . Arteriosclerotic cardiovascular disease (ASCVD) 1994    stent to RCA; CABG-2003  . Hard of hearing   . Anemia, iron deficiency   . AAA (abdominal aortic aneurysm)     Fusiform; infrarenal; 4-4.1cm on CT in 07/2008 and 12/2010 by MRI  . Gastric ulcer 2004     2004; upper GI bleed  . Renal cysts, acquired, bilateral     Complex by MRI in 12/2010  . Adenomatous polyps 04/07/2012  . HYPERTENSION 12/17/2007    Subsequently hypotensive and all antihypertensive medication discontinued Lab  03/2012: Mild anemia with H&H-11.8/37.5, MCV-97, ferritin-224, normal CMet ex G-124, alb-3.4   . NSTEMI (non-ST elevated myocardial infarction) 01/18/2011  . Diabetes mellitus, type II 07/13/12    family denies patient is diabetic  . Macrocytosis without anemia 08/13/2012  . Acute systolic congestive heart failure 08/15/2012    EF 35-40%, per Echo  . Aortic stenosis, moderate 08/15/2012  . Pulmonary hypertension due to COPD 08/15/2012    53 mm Hg  . Tricuspid valve regurgitation 08/15/2012  . Retroperitoneal bleed 06/2012  . On home O2   . Left leg swelling 03/2013  . Clostridium difficile colitis 03/2013  . Unspecified hypothyroidism 05/11/2013    Newly diagnosed.    Past Surgical History  Procedure Laterality Date  . Knee surgery      Left  . Bladder surgery    . Femoral artery stent  12-06-10    Left SFA  . Appendectomy    . Mitral valve replacement (mvr)/coronary artery bypass grafting (cabg)  2003    stent to RCA in 1994  . Eye surgery  2007    bilateral cataracts  . Tonsillectomy      thinks they were removed while in the [employer]  . Mass excision  06/07/2011    Procedure: EXCISION MASS;  Surgeon: Fabio Bering;  Location: [facility];  Service: General;  Laterality: N/A;  excision of 2 masses back and buttocks  . Back surgery    . Esophagogastroduodenoscopy  03/03/2003    Large, deep prepyloric ulcer, as described above without bleeding/ Normal esophagus  . Esophagogastroduodenoscopy  09/03/2004    normal throughout  . Colonoscopy  09/03/2004    small ulcer, without stigmata of bleeding  . Esophagogastroduodenoscopy  11/14/2004    Normal esophagus and small hiatal hernia, otherwise normal stomach   . Colonoscopy  11/14/2004    Internal hemorrhoids,  otherwise normal rectum/ left-sided diverticula , diffusely oozing right colon mucosa without a discrete lesion amenable to endoscopic therapy. 2 diminutive polyps. FELT TO HAVE AVMs/telangiectasias  . Colonoscopy  01/29/2005    Normal rectum  . Colonoscopy  03/11/2012    Colonic diverticulosis. Colonic polyps-removed as described above. Vascular anomalies in the cecum likely representing hemangiomas. Status post hemostasis clipping of  2 of the 3. ADENOMATOUS POLYPS. Repeat 2016  . Esophagogastroduodenoscopy  03/11/2012  Deformity of the antrum;  small polyp in antrum-not manipulated. Otherwise normal exam    Royann Shivers MS,RD,CSG,LDN Office: #[phone] Pager: [phone]

## 2013-06-22 NOTE — Progress Notes (Signed)
[facility] PROGRESS NOTE  CARR SHARTZER ZOX:[id number] DOB: 16-May-1922 DOA: 06/21/2013 PCP: [name], MD  Assessment/Plan: 1. Acute encephalopathy, likely multifactorial. Predominantly favor respiratory acidosis with CO2 retention secondary to COPD, known noncompliance with oxygen at home, acute on chronic CHF, possibly contributed to by digoxin elevation as well. 2. Acute hypercapnic respiratory failure, respiratory acidosis: Likely predominantly from COPD, CHF. 3. Acute renal failure on chronic kidney disease stage III  4. Acute on chronic systolic heart failure 5. COPD, chronic respiratory failure on oxygen, noncompliant at home 6. Digoxin level elevation. I doubt this is the etiology of his acute decompensation but is possibly contributing. Minimal change from yesterday. This certainly does not explain his respiratory failure. Poison control advised observation, no instability.  7. History of coronary artery disease, CABG, mitral valve replacement, permanent atrial fibrillation, pulmonary hypertension, moderate aortic stenosis not a candidate for antiplatelet therapy Secondary to history of bleeding. 8. Permanent atrial fibrillation, discontinue digoxin. Not on any rate control agents secondary to bradycardia. No Calcium channel blocker secondary to systolic dysfunction. 9. History of spontaneous retroperitoneal hematoma, not an anticoagulation candidate.  10. Diabetes mellitus type 2. No hypoglycemia noted. Appears well controlled.  11. Hypothyroidism 12. Moderate aortic stenosis   79 year old man complex past medical history admitted with multiple acute abnormalities. Daughter/healthcare power of attorney elected conservative management with no escalation of care, no BiPAP. Patient is DO NOT RESUSCITATE. Evaluated by cardiology today, diuresis recommended, felt to be appropriate for comfort care. Discussed above issues with daughter who has been caring for him for many  years. We have discussed treatment options including more aggressive care including respiratory. He has been suffering and just yesterday told her that he thought he was going to die. She does not want to prolong his condition. She requests comfort care be initiated and desires to pursue hospice placement. Patient appears to be dying, expect death within the next few days. For now continue diuresis and oxygen for comfort. She understands that death is expected soon and that the primary issues are felt to be from his chronic conditions including COPD and CHF.  Discontinue telemetry  Code Status: DNR, comfort measures DVT prophylaxis: heparin Family Communication: as above Disposition Plan: residential hospice 12/31 if survives.  Brendia Sacks, MD  [facility]  Pager [phone] If 7PM-7AM, please contact night-coverage at [email or web address], password [facility] 06/22/2013, 2:24 PM  LOS: 1 day   Summary: 79 year old man with complex past medical history including chronic systolic and diastolic heart failure, coronary artery disease, mitral valve replacement, permanent atrial fibrillation, moderate aortic stenosis and previous spontaneous retroperitoneal hematoma presented with confusion, hallucinations. Admitted for acute encephalopathy, digoxin toxicity, acute on chronic combined heart failure, acute respiratory failure. Admitting physician discussed with daughter, patient made DNR, and if no improvement after normalization of the digoxin levels, and optimal diuresis, she is agreeable to transition to full comfort care.   Recently admitted for massive anasarca secondary to acute on chronic systolic heart, right-sided heart failure secondary to pulmonary hypertension. Pericarditis cannot tolerate beta blocker. Long-term prognosis felt to be poor patient was discharged home with hospice.  Consultants:  Cardiology   Procedures:    Antibiotics:    HPI/Subjective: Pulling at oxygen mask. Not  interactive.   Objective: Filed Vitals:   06/22/13 0249 06/22/13 0340 06/22/13 0642 06/22/13 1322  BP: 136/65  109/67   Pulse: 50  39   Temp: 98.9 F (37.2 C)  96.8 F (36 C)   TempSrc: Oral  Axillary  Resp: 22  17   Height: 6\' 3"  (1.905 m)     Weight: 107.9 kg (237 lb 14 oz)     SpO2: 87% 91% 83% 92%    Intake/Output Summary (Last 24 hours) at 06/22/13 1424 Last data filed at 06/22/13 0600  Gross per 24 hour  Intake     38 ml  Output     50 ml  Net    -12 ml     Filed Weights   06/21/13 2303 06/22/13 0249  Weight: 110.224 kg (243 lb) 107.9 kg (237 lb 14 oz)    Exam:   Afebrile. Normotensive. Hypoxic on Venturi mask. Bradycardic at times.  General: He appears critically ill, appears to be dying. However he appears overall,.  Cardiovascular: Regular rate and rhythm. No murmur, rub or gallop. Minimal lower extremity edema.  Respiratory: Coarse breath sounds but no wheezes, rales or rhonchi. Normal respiratory effort.  Abdomen soft nontender and nondistended  Skin appears to be grossly unremarkable  Data Reviewed:  Weight down 3 kg  ABG on admission revealed acute respiratory acidosis with hypercapnia    creatinine without significant change since discharge/admission. 1.69. BUN stable at 53.  Troponin negative  Digoxin level 2.3  Chest x-ray with developing pulmonary vascular congestion  Scheduled Meds: . cycloSPORINE  1 drop Both Eyes BID  . furosemide  40 mg Intravenous BID  . heparin  5,000 Units Subcutaneous Q8H  . ipratropium-albuterol  3 mL Nebulization Q6H  . levothyroxine  100 mcg Oral QAC breakfast  . pantoprazole  40 mg Oral Daily  . potassium chloride SA  20 mEq Oral Daily  . simvastatin  20 mg Oral q1800  . sodium chloride  3 mL Intravenous Q12H  . tamsulosin  0.4 mg Oral QHS   Continuous Infusions: . sodium chloride 20 mL/hr (06/22/13 0406)    Principal Problem:   Digoxin toxicity Active Problems:   HYPERLIPIDEMIA   Acute on  chronic combined systolic and diastolic CHF (congestive heart failure)   Acute respiratory failure   Aortic stenosis, moderate   Acute encephalopathy   Unspecified hypothyroidism   Time spent 40 minutes, greater than 50% in counseling and coordination of care.

## 2013-06-22 NOTE — Consult Note (Signed)
CARDIOLOGY CONSULT NOTE   Patient ID: ZERRICK HANSSEN MRN: [id number] DOB/AGE: [birth year] 79 y.o.  Admit Date: 06/21/2013 Referring Physician: [facility] Primary Physician: [name], MD Consulting Cardiologist: Dr. Jonelle Sidle Primary Cardiologist: Dr. Prentice Docker Reason for Consultation: CHF and digoxin toxicity  Clinical Summary Mr. Steinke is a medically complex 79 y.o.male with multiple medical problems readmitted after 3 days, after lengthy hospitalization for systolic CHF, history of moderate aortic valve stenosis, atrial fibrillation and bradycardia. At that time he was diuresed 6L and was relatively stable on discharge. Patient was sent home with Hospice and has been made a DO NOT RESUSCITATE. His daughter is his main caregiver.  He reportedly became confused with worsening weakness, and was noted to be hypoxic with O2 sats in the 60s per his daughter. Also not wanting to eat or drink fluids. Due to his worsening symptoms his daughter brought him back to the emergency room. Digoxin level was found to be elevated at 2.4, with a chest x-ray demonstrating cardiac enlargement and developing pulmonary vascular congestion, perihilar edema and small effusions. Pro BNP 15,443. Creatinine 1.69. History the emergency room with albuterol and Atrovent inhalers, and Ativan. The results of this toxicity and recurrent CHF we are asked for further cardiology recommendations.  Has subsequently been placed on IV Lasix 40 mg twice a day, digoxin has been discontinued. Weight on admission 243 pounds, with subsequent weight loss of 6 pounds since admission. He remains fairly unresponsive.    Allergies  Allergen Reactions  . Aspirin Other (See Comments)    CANNOT TAKE DE TO BLEEDING  . Gabapentin     Patient cannot take over 200 mg dose at a time as it causes jerking or spasms  . Naproxen Other (See Comments)    CANNOT TAKE/NO REASON GIVEN    Medications Scheduled  Medications: . cycloSPORINE  1 drop Both Eyes BID  . furosemide  40 mg Intravenous BID  . heparin  5,000 Units Subcutaneous Q8H  . ipratropium-albuterol  3 mL Nebulization Q6H  . levothyroxine  100 mcg Oral QAC breakfast  . pantoprazole  40 mg Oral Daily  . potassium chloride SA  20 mEq Oral Daily  . simvastatin  20 mg Oral q1800  . sodium chloride  3 mL Intravenous Q12H  . tamsulosin  0.4 mg Oral QHS     Infusions: . sodium chloride 20 mL/hr (06/22/13 0406)     PRN Medications:  acetaminophen, acetaminophen, albuterol, LORazepam, morphine injection, ondansetron (ZOFRAN) IV, ondansetron, oxyCODONE   Past Medical History  Diagnosis Date  . Mitral valve prolapse 2003    MVR in 2003  . Obesity   . Osteoarthritis   . Hypertension   . Hyperlipidemia   . COPD (chronic obstructive pulmonary disease)   . CHF (congestive heart failure) diastolic 10/2010    CXR in 12/2010: Prior CABG, cardiomegaly, vascular redistribution, bibasilar atelectasis, small effusions  . Pneumonia   . Impaired glucose tolerance   . Atrial fibrillation, chronic   . Prostate cancer     elevated PSA  . GERD (gastroesophageal reflux disease)     + hiatal hernia  . Arteriosclerotic cardiovascular disease (ASCVD) 1994    stent to RCA; CABG-2003  . Hard of hearing   . Anemia, iron deficiency   . AAA (abdominal aortic aneurysm)     Fusiform; infrarenal; 4-4.1cm on CT in 07/2008 and 12/2010 by MRI  . Gastric ulcer 2004    2004; upper GI bleed  . Renal cysts,  acquired, bilateral     Complex by MRI in 12/2010  . Adenomatous polyps 04/07/2012  . HYPERTENSION 12/17/2007    Subsequently hypotensive and all antihypertensive medication discontinued Lab  03/2012: Mild anemia with H&H-11.8/37.5, MCV-97, ferritin-224, normal CMet ex G-124, alb-3.4   . NSTEMI (non-ST elevated myocardial infarction) 01/18/2011  . Diabetes mellitus, type II 07/13/12    family denies patient is diabetic  . Macrocytosis without anemia  08/13/2012  . Acute systolic congestive heart failure 08/15/2012    EF 35-40%, per Echo  . Aortic stenosis, moderate 08/15/2012  . Pulmonary hypertension due to COPD 08/15/2012    53 mm Hg  . Tricuspid valve regurgitation 08/15/2012  . Retroperitoneal bleed 06/2012  . On home O2   . Left leg swelling 03/2013  . Clostridium difficile colitis 03/2013  . Unspecified hypothyroidism 05/11/2013    Newly diagnosed.    Past Surgical History  Procedure Laterality Date  . Knee surgery      Left  . Bladder surgery    . Femoral artery stent  12-06-10    Left SFA  . Appendectomy    . Mitral valve replacement (mvr)/coronary artery bypass grafting (cabg)  2003    stent to RCA in 1994  . Eye surgery  2007    bilateral cataracts  . Tonsillectomy      thinks they were removed while in the [employer]  . Mass excision  06/07/2011    Procedure: EXCISION MASS;  Surgeon: Fabio Bering;  Location: [facility];  Service: General;  Laterality: N/A;  excision of 2 masses back and buttocks  . Back surgery    . Esophagogastroduodenoscopy  03/03/2003    Large, deep prepyloric ulcer, as described above without bleeding/ Normal esophagus  . Esophagogastroduodenoscopy  09/03/2004    normal throughout  . Colonoscopy  09/03/2004    small ulcer, without stigmata of bleeding  . Esophagogastroduodenoscopy  11/14/2004    Normal esophagus and small hiatal hernia, otherwise normal stomach   . Colonoscopy  11/14/2004    Internal hemorrhoids, otherwise normal rectum/ left-sided diverticula , diffusely oozing right colon mucosa without a discrete lesion amenable to endoscopic therapy. 2 diminutive polyps. FELT TO HAVE AVMs/telangiectasias  . Colonoscopy  01/29/2005    Normal rectum  . Colonoscopy  03/11/2012    Colonic diverticulosis. Colonic polyps-removed as described above. Vascular anomalies in the cecum likely representing hemangiomas. Status post hemostasis clipping of  2 of the 3. ADENOMATOUS POLYPS. Repeat 2016  .  Esophagogastroduodenoscopy  03/11/2012    Deformity of the antrum;  small polyp in antrum-not manipulated. Otherwise normal exam    Family History  Problem Relation Age of Onset  . Stroke Mother   . Stroke Brother   . Heart attack Brother   . Cancer Brother   . Cancer Brother   . Anesthesia problems Neg Hx   . Hypotension Neg Hx   . Malignant hyperthermia Neg Hx   . Pseudochol deficiency Neg Hx     Social History Mr. Weant reports that he quit smoking about 5 years ago. His smoking use included Cigarettes. He has a 70 pack-year smoking history. He has quit using smokeless tobacco. Mr. Goynes reports that he does not drink alcohol.  Review of Systems Otherwise reviewed and negative except as outlined.  Physical Examination Blood pressure 109/67, pulse 39, temperature 96.8 F (36 C), temperature source Axillary, resp. rate 17, height 6\' 3"  (1.905 m), weight 237 lb 14 oz (107.9 kg), SpO2 83.00%.  Intake/Output Summary (  Last 24 hours) at 06/22/13 0857 Last data filed at 06/22/13 0600  Gross per 24 hour  Intake     38 ml  Output     50 ml  Net    -12 ml   Chronically ill-appearing male on oxygen via face mask, not responsive at present. HEENT: Conjunctiva and lids normal, oropharynx not examined. Neck: Supple, elevated JVP, no thyromegaly. Lungs: Decreased breath sounds, nonlabored breathing at rest. Cardiac: Irregularly irregular, no S3, 1-2/6 systolic murmur, no pericardial rub. Abdomen: Soft, nontender, bowel sounds present, no guarding or rebound. Extremities: Improved leg edema, distal pulses 1+.. Skin: Warm and dry. Musculoskeletal: No kyphosis. Neuropsychiatric: Unresponsive at present.   Prior Cardiac Testing/Procedures Left ventricle: Diffuse hypokinesis worse in the inferior wall The cavity size was normal. There was moderate concentric hypertrophy. Systolic function was moderately reduced. The estimated ejection fraction was in the range of 35% to 40%. -  Aortic valve: Moderately calcified with restricted motion. Suspect moderate AS given degree of LV dysfunction CW not adequate Have spoken to tech to repeat - Mitral valve: Tissue MVR not well seen Diastolic gradient are a bit high No MR Valve area by pressure half-time: 1.71cm^2. - Left atrium: The atrium was moderately dilated. - Right ventricle: The cavity size was severely dilated. - Right atrium: The atrium was mildly dilated. - Atrial septum: No defect or patent foramen ovale was identified. - Tricuspid valve: Moderate regurgitation. - Pulmonary arteries: PA peak pressure: 53mm Hg (S).   Lab Results  Basic Metabolic Panel:  Recent Labs Lab 06/16/13 0502 06/17/13 0602 06/18/13 0517 06/21/13 2358 06/22/13 0510  NA 136 138 136 133* 136*  K 3.6 3.4* 3.6 4.9 4.8  CL 94* 92* 92* 89* 91*  CO2 34* 33* 36* 33* 33*  GLUCOSE 110* 91 85 113* 100*  BUN 36* 35* 35* 52* 53*  CREATININE 1.43* 1.44* 1.51* 1.65* 1.69*  CALCIUM 8.2* 8.6 8.2* 9.0 8.8  MG 2.0 2.0 1.9  --   --     Liver Function Tests:  Recent Labs Lab 06/22/13 0510  AST 35  ALT 13  ALKPHOS 50  BILITOT 0.7  PROT 7.4  ALBUMIN 3.3*    CBC:  Recent Labs Lab 06/21/13 2358 06/22/13 0510  WBC 7.4 6.9  NEUTROABS 5.5  --   HGB 13.9 13.5  HCT 43.7 42.5  MCV 101.4* 101.7*  PLT 125* 122*    Cardiac Enzymes:  Recent Labs Lab 06/22/13 0006  TROPONINI <0.30    Radiology: Dg Chest Portable 1 View  06/22/2013   CLINICAL DATA:  Shortness of breath and decreased oxygen saturation tonight. History of mitral valve replacement.  EXAM: PORTABLE CHEST - 1 VIEW  COMPARISON:  06/13/2013  FINDINGS: Shallow inspiration. Cardiac enlargement with increased pulmonary vascularity suggesting vascular congestion. Perihilar changes suggesting early edema. Findings are progressing since previous study. Suggestion of small bilateral pleural effusions with basilar atelectasis. Calcified and tortuous aorta. No pneumothorax.   IMPRESSION: Cardiac enlargement with developing pulmonary vascular congestion, perihilar edema, and small effusions.   Electronically Signed   By: Burman Nieves M.D.   On: 06/22/2013 00:19    ECG: Atrial fibrillation rate of 66 bpm  Impression and Recommendations  1. Acute on Chronic Systolic CHF: Most recent EF is 35%-40%  per echo. Per daughter the patient had worsening edema, especially in his abdomen, with oxygen desaturations and some unresponsiveness. She is quite anxious and had him return to the hospital as she was unable to arouse him or  care for him. He has been placed back on IV diuretics, and has diuresed, but remains unresponsive and oxygen dependent. There is no evidence of lower extremity edema, or abdominal distention. Breathing is shallow with wheezes.  I doubt this patient will improve substantially despite our best efforts. Would continue IV diuretics for now. Believe this will be a short-term fix, and not a long-term solution. The patient is not taking in by mouth meds at this time. Prognosis is grave.  2. Digoxin Toxicity: Digoxin has been discontinued.  3. Atrial fibrillation: Heart rate is currently well-controlled. He is on heparin subcutaneous.  I had a discussion with the family, concerning their wishes about returning home with Hospice vs. staying in the hospital. The family is quite tearful and should be discussing this among themselves and referring their wishes to primary care physician, and/or social worker. I have advised them that once they make a decision to notify [facility] about bringing him home or having him stay in the hospital for ongoing treatment for care and comfort only.  Signed: Bettey Mare. [name] [name] 06/22/2013, 8:57 AM Co-Sign MD   Attending note:  Patient seen and examined. Modified above note by Ms. Lawrence NP. Reviewed records and discussed with patient's caregiver at bedside. Patient continues to deteriorate clinically and  has a DO NOT RESUSCITATE status as well as enrollment in Hospice at present. Agree with stopping digoxin. Have converted back to IV Lasix for the time being. He does not appear to be in pain or struggling, confirmed by his caregiver. Would consider comfort measures if this becomes the case. Otherwise can try and optimize volume status, realizing that endpoint is unlikely to be changed appreciably.  Jonelle Sidle, M.D., F.A.C.C.

## 2013-06-22 NOTE — H&P (Addendum)
PATIENT DETAILS Name: [name] Age: 79 y.o. Sex: male Date of Birth: 05-01-1922 Admit Date: 06/21/2013 [id number] Lodema Hong, MD   CHIEF COMPLAINT:  Confusion, hallucinations for the past 2 days  HPI: [name] is a 79 y.o. 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 Patient was discharged from this hospital on 12/27 after a week stay. He apparently had significant anasarca and was diuresed with significant improvement. On the day of discharge she was significantly better, however on going home he slowly started becoming more confused. He lives with his daughter who was at bedside and was providing most of the history, apparently patient has become very weak, started becoming confused and delirious. Patient is on home oxygen, and he kept pulling off his oxygen, and his daughter noted that his O2 saturations at one point was in the 60s. Because of persistent confusion and inability of the patient's daughter who continue taking care of at home, patient was brought back to the emergency room, where further workup revealed a Digoxin level of 2.4. Chest x-ray showed pulmonary vascular congestion. I was subsequently asked to admit this patient for further evaluation and treatment. Patient's daughter denies any fever, nausea, vomiting or diarrhea. There is no history of any pain. Unfortunately for the past few days patient has had no significant intake because of persistent confusion.   ALLERGIES:   Allergies  Allergen Reactions  . Aspirin Other (See Comments)    CANNOT TAKE DE TO BLEEDING  . Gabapentin     Patient cannot take  over 200 mg dose at a time as it causes jerking or spasms  . Naproxen Other (See Comments)    CANNOT TAKE/NO REASON GIVEN    PAST MEDICAL HISTORY: Past Medical History  Diagnosis Date  . Mitral valve prolapse 2003    MVR in 2003  . Obesity   . Osteoarthritis   . Hypertension   . Hyperlipidemia   . COPD (chronic obstructive pulmonary disease)   . CHF (congestive heart failure) diastolic 10/2010    CXR in 12/2010: Prior CABG, cardiomegaly, vascular redistribution, bibasilar atelectasis, small effusions  . Pneumonia   . Impaired glucose tolerance   . Atrial fibrillation, chronic   . Prostate cancer     elevated PSA  . GERD (gastroesophageal reflux disease)     + hiatal hernia  . Arteriosclerotic cardiovascular disease (ASCVD) 1994    stent to RCA; CABG-2003  . Hard of hearing   . Anemia, iron deficiency   . AAA (abdominal aortic aneurysm)     Fusiform; infrarenal; 4-4.1cm on CT in 07/2008 and 12/2010 by MRI  . Gastric ulcer 2004    2004; upper GI bleed  . Renal cysts, acquired, bilateral     Complex by MRI in 12/2010  . Adenomatous polyps 04/07/2012  . HYPERTENSION 12/17/2007    Subsequently hypotensive and all antihypertensive medication discontinued Lab  03/2012: Mild anemia with H&H-11.8/37.5, MCV-97, ferritin-224, normal CMet ex G-124, alb-3.4   . NSTEMI (non-ST elevated myocardial infarction) 01/18/2011  . Diabetes mellitus, type II 07/13/12    family denies patient is diabetic  . Macrocytosis without anemia 08/13/2012  . Acute systolic congestive heart failure 08/15/2012    EF  35-40%, per Echo  . Aortic stenosis, moderate 08/15/2012  . Pulmonary hypertension due to COPD 08/15/2012    53 mm Hg  . Tricuspid valve regurgitation 08/15/2012  . Retroperitoneal bleed 06/2012  . On home O2   . Left leg swelling 03/2013  . Clostridium difficile colitis 03/2013  . Unspecified hypothyroidism 05/11/2013    Newly diagnosed.    PAST SURGICAL HISTORY: Past Surgical History  Procedure  Laterality Date  . Knee surgery      Left  . Bladder surgery    . Femoral artery stent  12-06-10    Left SFA  . Appendectomy    . Mitral valve replacement (mvr)/coronary artery bypass grafting (cabg)  2003    stent to RCA in 1994  . Eye surgery  2007    bilateral cataracts  . Tonsillectomy      thinks they were removed while in the [employer]  . Mass excision  06/07/2011    Procedure: EXCISION MASS;  Surgeon: Fabio Bering;  Location: [facility];  Service: General;  Laterality: N/A;  excision of 2 masses back and buttocks  . Back surgery    . Esophagogastroduodenoscopy  03/03/2003    Large, deep prepyloric ulcer, as described above without bleeding/ Normal esophagus  . Esophagogastroduodenoscopy  09/03/2004    normal throughout  . Colonoscopy  09/03/2004    small ulcer, without stigmata of bleeding  . Esophagogastroduodenoscopy  11/14/2004    Normal esophagus and small hiatal hernia, otherwise normal stomach   . Colonoscopy  11/14/2004    Internal hemorrhoids, otherwise normal rectum/ left-sided diverticula , diffusely oozing right colon mucosa without a discrete lesion amenable to endoscopic therapy. 2 diminutive polyps. FELT TO HAVE AVMs/telangiectasias  . Colonoscopy  01/29/2005    Normal rectum  . Colonoscopy  03/11/2012    Colonic diverticulosis. Colonic polyps-removed as described above. Vascular anomalies in the cecum likely representing hemangiomas. Status post hemostasis clipping of  2 of the 3. ADENOMATOUS POLYPS. Repeat 2016  . Esophagogastroduodenoscopy  03/11/2012    Deformity of the antrum;  small polyp in antrum-not manipulated. Otherwise normal exam    MEDICATIONS AT HOME: Prior to Admission medications   Medication Sig Start Date End Date Taking? Authorizing Provider  albuterol (PROVENTIL) (2.5 MG/3ML) 0.083% nebulizer solution Take 3 mLs (2.5 mg total) by nebulization every 6 (six) hours as needed for wheezing or shortness of breath. 06/19/13  Yes Leroy Sea, MD   Cholecalciferol 1000 UNITS tablet Take 1 tablet (1,000 Units total) by mouth daily. 06/19/13  Yes Leroy Sea, MD  cycloSPORINE (RESTASIS) 0.05 % ophthalmic emulsion Place 1 drop into both eyes 2 (two) times daily. 06/19/13  Yes Leroy Sea, MD  digoxin (LANOXIN) 0.125 MG tablet Take 1 tablet (0.125 mg total) by mouth daily. 06/19/13  Yes Leroy Sea, MD  Fluticasone-Salmeterol (ADVAIR) 100-50 MCG/DOSE AEPB Inhale 1 puff into the lungs 2 (two) times daily. 06/19/13  Yes Leroy Sea, MD  furosemide (LASIX) 80 MG tablet Take 1 tablet (80 mg total) by mouth 2 (two) times daily. For 2 days start date 06/12/13 06/19/13  Yes Leroy Sea, MD  guaiFENesin (MUCINEX) 600 MG 12 hr tablet Take 600 mg by mouth 2 (two) times daily.   Yes Historical Provider, MD  ipratropium (ATROVENT) 0.02 % nebulizer solution Take 0.5 mg by nebulization every 6 (six) hours as needed for wheezing or shortness of breath.   Yes Historical Provider, MD  levothyroxine (SYNTHROID, LEVOTHROID) 100 MCG tablet  Take 1 tablet (100 mcg total) by mouth daily before breakfast. 06/19/13  Yes Leroy Sea, MD  lovastatin (MEVACOR) 40 MG tablet Take 1 tablet (40 mg total) by mouth every other day. Patient takes on Sunday,Monday,Wednesday,Friday 06/19/13  Yes Leroy Sea, MD  metolazone (ZAROXOLYN) 2.5 MG tablet Take 1 tablet (2.5 mg total) by mouth once a week. 06/15/13  Yes Leroy Sea, MD  Multiple Vitamins-Minerals (CENTRUM SILVER) tablet Take 1 tablet by mouth daily. 06/19/13  Yes Leroy Sea, MD  nystatin (MYCOSTATIN/NYSTOP) 100000 UNIT/GM POWD Applied to affected skin to 3 times a day 06/19/13  Yes Leroy Sea, MD  omeprazole (PRILOSEC) 40 MG capsule Take 1 capsule (40 mg total) by mouth daily. 06/19/13  Yes Leroy Sea, MD  potassium chloride SA (K-DUR,KLOR-CON) 20 MEQ tablet Take 1 tablet (20 mEq total) by mouth daily. For 2 days 06/12/13 & 06/13/13 06/19/13  Yes Leroy Sea, MD   tamsulosin (FLOMAX) 0.4 MG CAPS capsule Take 1 capsule (0.4 mg total) by mouth at bedtime. 06/19/13  Yes Leroy Sea, MD    FAMILY HISTORY: Family History  Problem Relation Age of Onset  . Stroke Mother   . Stroke Brother   . Heart attack Brother   . Cancer Brother   . Cancer Brother   . Anesthesia problems Neg Hx   . Hypotension Neg Hx   . Malignant hyperthermia Neg Hx   . Pseudochol deficiency Neg Hx     SOCIAL HISTORY:  reports that he quit smoking about 5 years ago. His smoking use included Cigarettes. He has a 70 pack-year smoking history. He has quit using smokeless tobacco. He reports that he does not drink alcohol or use illicit drugs.  REVIEW OF SYSTEMS: Obtained from daughter Constitutional:   No  weight loss, night sweats,  Fevers, chills.  HEENT:    No headaches, Difficulty swallowing,Tooth/dental problems,Sore throat,  No sneezing, itching, ear ache, nasal congestion, post nasal drip,   Cardio-vascular: No chest pain,   dizziness, palpitations  GI:  No heartburn, indigestion, abdominal pain, nausea, vomiting, diarrhea, change in   bowel habits, loss of appetite  Resp:  No excess mucus, no productive cough, No non-productive cough,  No coughing up of blood.No change in color of mucus.No wheezing.No chest wall deformity  Skin:  no rash or lesions.  GU:  no dysuria, change in color of urine, no urgency or frequency.  No flank pain.  Musculoskeletal: No joint pain or swelling.  No decreased range of motion.  No back pain.  PHYSICAL EXAM: Blood pressure 117/62, pulse 80, temperature 98.2 F (36.8 C), resp. rate 22, weight 110.224 kg (243 lb), SpO2 90.00%.  General appearance :Awake, but very confused. Mumbles incoherently. Pulling on telemetry leads.alert,HEENT: Atraumatic and Normocephalic, pupils equally reactive to light and accomodation Neck: supple, no JVD. No cervical lymphadenopathy.  Chest:Good air entry bilaterally, by basilar rales. CVS:  S1 S2 regular, 3/6 systolic murmur.  Abdomen: Bowel sounds present, Non tender and not distended with no gaurding, rigidity or rebound.+ Abdominal wall edema. Dull to percussion in the flanks. Extremities: B/L Lower Ext shows 1+edema, both legs are warm to touch. Has edema in the upper thigh and in the sacral area. Neurology:  Non focal- moves all 4 extremities. Skin:No Rash Wounds:N/A  LABS ON ADMISSION:   Recent Labs  06/21/13 2358  NA 133*  K 4.9  CL 89*  CO2 33*  GLUCOSE 113*  BUN 52*  CREATININE 1.65*  CALCIUM 9.0   No results found for this basename: AST, ALT, ALKPHOS, BILITOT, PROT, ALBUMIN,  in the last 72 hours No results found for this basename: LIPASE, AMYLASE,  in the last 72 hours  Recent Labs  06/21/13 2358  WBC 7.4  NEUTROABS 5.5  HGB 13.9  HCT 43.7  MCV 101.4*  PLT 125*    Recent Labs  06/22/13 0006  TROPONINI <0.30   No results found for this basename: DDIMER,  in the last 72 hours No components found with this basename: POCBNP,    RADIOLOGIC STUDIES ON ADMISSION: Dg Chest Portable 1 View  06/22/2013   CLINICAL DATA:  Shortness of breath and decreased oxygen saturation tonight. History of mitral valve replacement.  EXAM: PORTABLE CHEST - 1 VIEW  COMPARISON:  06/13/2013  FINDINGS: Shallow inspiration. Cardiac enlargement with increased pulmonary vascularity suggesting vascular congestion. Perihilar changes suggesting early edema. Findings are progressing since previous study. Suggestion of small bilateral pleural effusions with basilar atelectasis. Calcified and tortuous aorta. No pneumothorax.  IMPRESSION: Cardiac enlargement with developing pulmonary vascular congestion, perihilar edema, and small effusions.   Electronically Signed   By: Burman Nieves M.D.   On: 06/22/2013 00:19     EKG: Independently reviewed. Afib  ASSESSMENT AND PLAN: Present on Admission:  . Acute encephalopathy - Suspect secondary to digoxin toxicity. Hold digoxin. -  Check UA, chest x-ray negative for pneumonia, no fever to suggest any form of infection.  - Monitor and follow clinically.  . Digoxin toxicity - Clinically manifested mostly with neurological symptoms. - Hold digoxin, ED M.D. has already spoken with poison control, current recommendations are for supportive care and to hold  Digoxin. - Monitor potassium closely.  . Acute on chronic combined systolic and diastolic CHF (congestive heart failure) - Clinically still volume overloaded, as a significant upper thigh edema, sacral edema and abdominal wall edema. Chest x-ray also suggestive of pulmonary edema. However also appears to be intravascularly depleted somewhat. Difficult situation. Continue cautiously with Lasix, hopefully mental status would improve so that the patient and have some by mouth intake. Will need to be cautious about reducing preload given history of moderate to severe aortic stenosis. - Daily weights, strict intake and output.  . Acute respiratory failure - both hypercarbic and hypoxic  - Suspect secondary to encephalopathy, CHF, underlying COPD on playing a role.  - Direct encephalopathy, cautiously diurese, scheduled nebulized bronchodilators.  - Long discussion with patient's daughter Ms. [name] Wilson at [employer] prognosis explained, apart from outlined care , no further escalation of care will be offered. She is understanding and is agreeable. Patient is a DO NOT RESUSCITATE, if no improvement after normalization of the digoxin levels, and optimal diuresis, she is agreeable to transition to full comfort care.  - She is also agreeable to start some medications for comfort, hence will start as needed Ativan and morphine.  - Continue with oxygen to keep O2 saturation above 90%.   . Acute on chronic renal failure stage III - Suspect secondary to hypoperfusion from CHF, poor oral intake. - Cautiously continue with Lasix but at a reduced dose of 40 mg IV twice a  day.  . Aortic stenosis, moderate - Suspect patient is not a candidate for any invasive therapy.  - Avoid significant reductions in preload.   .Afib -hold digoxin given supra- therapeutic levels -not a anticoagulation candidate given prior hx of spontaneous retroperitoneal hematoma -in permanent Afib-rate currently controlled  . HYPERLIPIDEMIA - Continue statin   .  Unspecified hypothyroidism - Continue levothyroxine  . End of life issues/palliative care - DO NOT RESUSCITATE - Patient's daughter-Ms. [name] Wilson agreeable for no further escalation in care apart from what outlined above - She wishes to focus on comfort as well, but would want to see if patient would recover if the digoxin levels were to come back to normal. If no improvement, in the next few days she's agree with the transition to full comfort care in residential hospice placement. - Will start as needed Ativan and morphine for comfort.  Further plan will depend as patient's clinical course evolves and further radiologic and laboratory data become available. Patient will be monitored closely.  Above noted plan was discussed with Daughter Ms. Naida Sleight, she was in agreement.   DVT Prophylaxis: Prophylactic Heparin  Code Status: DNR  Total time spent for admission equals 45 minutes.  [facility] Pager [phone]  If 7PM-7AM, please contact night-coverage [email or web address] Password [facility] 06/22/2013, 2:37 AM

## 2013-06-23 ENCOUNTER — Ambulatory Visit: Payer: Medicare Other | Admitting: Family Medicine

## 2013-06-23 LAB — BASIC METABOLIC PANEL
BUN: 54 mg/dL — ABNORMAL HIGH (ref 6–23)
CO2: 34 mEq/L — ABNORMAL HIGH (ref 19–32)
Calcium: 9 mg/dL (ref 8.4–10.5)
Creatinine, Ser: 1.87 mg/dL — ABNORMAL HIGH (ref 0.50–1.35)
Glucose, Bld: 89 mg/dL (ref 70–99)
Sodium: 139 mEq/L (ref 137–147)

## 2013-06-23 LAB — GLUCOSE, CAPILLARY: Glucose-Capillary: 91 mg/dL (ref 70–99)

## 2013-06-23 MED ORDER — LORAZEPAM 0.5 MG PO TABS: 0.5000 mg | ORAL_TABLET | Freq: Four times a day (QID) | ORAL | Status: AC | PRN

## 2013-06-23 MED ORDER — MORPHINE SULFATE (CONCENTRATE) 10 MG /0.5 ML PO SOLN: 2.5000 mg | ORAL | Status: DC | PRN

## 2013-06-23 MED ORDER — MORPHINE SULFATE 10 MG/5ML PO SOLN: 2.5000 mg | ORAL | Status: AC | PRN

## 2013-06-23 MED ORDER — ACETAMINOPHEN 650 MG RE SUPP: 650.0000 mg | Freq: Four times a day (QID) | RECTAL | Status: AC | PRN

## 2013-06-23 MED ORDER — LORAZEPAM 0.5 MG PO TABS: 0.5000 mg | ORAL_TABLET | Freq: Four times a day (QID) | ORAL | Status: DC | PRN

## 2013-06-23 NOTE — Progress Notes (Signed)
[facility] PROGRESS NOTE  ISIAHA [name] [name]:[id number] DOB: 03-14-1922 DOA: 06/21/2013 PCP: [name], MD  Assessment/Plan: 1. Acute encephalopathy, likely multifactorial. Predominantly favor respiratory acidosis with CO2 retention secondary to COPD, known noncompliance with oxygen at home, acute on chronic CHF. No change. 2. Acute hypercapnic respiratory failure, respiratory acidosis: Likely predominantly from COPD, CHF. He has been hospice in the past for COPD 3. Acute renal failure on chronic kidney disease stage III , somewhat worse with diuresis 4. Acute on chronic systolic heart failure, no significant change clinically 5. COPD, chronic respiratory failure on oxygen, noncompliant at home 6. Digoxin level elevation. No significant change. Discussed with Dr. Diona Browner and literature reviewed--this is felt to be clinically insignificant and noncontributory to his current condition.  7. History of coronary artery disease, CABG, mitral valve replacement, permanent atrial fibrillation, pulmonary hypertension, moderate aortic stenosis not a candidate for antiplatelet therapy Secondary to history of bleeding. 8. Permanent atrial fibrillation, discontinue digoxin. Not on any rate control agents secondary to bradycardia.  9. History of spontaneous retroperitoneal hematoma, not an anticoagulation candidate.  10. Diabetes mellitus type 2. No hypoglycemia noted.  11. Hypothyroidism 12. Moderate aortic stenosis   Transfer to residential hospice facility  Long discussion with [name] (daughter/healthcare power of attorney) at bedside. We reviewed laboratory studies, clinical findings, current diagnoses and treatment. We discussed again treatment options including more aggressive treatment including with BiPAP or mechanical ventilation, fluids and more aggressive care COPD, CHF. We discussed current issues, predominant COPD with acute respiratory acidosis and respiratory failure as well as  CHF, acute renal failure. We discussed elevated digoxin level which is felt to be noncontributory to his clinical condition. In discussion, she clearly desires very conservative treatment with focus on comfort, supplemental oxygen by mask but no BiPAP, or escalation of care. She understands  patient is dying secondary to COPD and CHF and elects residential hospice care.She understands patient is likely to die within the next several days. She wishes to continue Venimask at this time.   Brendia Sacks, MD  [facility]  Pager [phone] If 7PM-7AM, please contact night-coverage at [email or web address], password [facility] 06/23/2013, 11:32 AM  LOS: 2 days   Summary: 79 year old man with complex past medical history including chronic systolic and diastolic heart failure, coronary artery disease, mitral valve replacement, permanent atrial fibrillation, moderate aortic stenosis and previous spontaneous retroperitoneal hematoma presented with confusion, hallucinations. Admitted for acute encephalopathy, digoxin toxicity, acute on chronic combined heart failure, acute respiratory failure. Admitting physician discussed with daughter, patient made DNR, and admitted.  Recently admitted for massive anasarca secondary to acute on chronic systolic heart, right-sided heart failure secondary to pulmonary hypertension. Long-term prognosis felt to be poor patient was discharged home with hospice.  Consultants:  Cardiology   Procedures:    Antibiotics:    HPI/Subjective: Remains nonresponsive. Daughter at bedside all night reports he was briefly awake yesterday.  Objective: Filed Vitals:   06/22/13 2207 06/23/13 0218 06/23/13 0549 06/23/13 0733  BP:   116/64   Pulse:   73   Temp:   97.7 F (36.5 C)   TempSrc:   Axillary   Resp:   20   Height:      Weight:   107.6 kg (237 lb 3.4 oz)   SpO2: 88% 89% 90% 87%   No intake or output data in the 24 hours ending 06/23/13 1132   Filed Weights   06/21/13  2303 06/22/13 0249 06/23/13 0549  Weight: 110.224 kg (243 lb) 107.9 kg (  237 lb 14 oz) 107.6 kg (237 lb 3.4 oz)    Exam:   Afebrile. Normotensive. Remains hypoxic on Venturi mask.  Appears calm and comfortable  Cardiovascular regular rate and rhythm. No murmur, rub or gallop  Respiratory: Clear to auscultation bilaterally, no wheezes, rales or rhonchi. Moderate increased respiratory effort  Abdomen very large with subcutaneous edema  Left lower extremity edema greater than right, feet warm and dry  Psychiatric cannot assess patient does not arouse to voice or tactile stimulation  Data Reviewed:  Weight without change from yesterday. Remains down 3 kg from admission.  Void noted  BUN, creatinine mildly worse. Potassium 5.2.  Digoxin level 2.6  Scheduled Meds: . cycloSPORINE  1 drop Both Eyes BID  . furosemide  40 mg Intravenous BID  . heparin  5,000 Units Subcutaneous Q8H  . ipratropium-albuterol  3 mL Nebulization Q6H  . levothyroxine  100 mcg Oral QAC breakfast  . pantoprazole  40 mg Oral Daily  . potassium chloride SA  20 mEq Oral Daily  . simvastatin  20 mg Oral q1800  . sodium chloride  3 mL Intravenous Q12H  . tamsulosin  0.4 mg Oral QHS   Continuous Infusions: . sodium chloride 20 mL/hr (06/22/13 0406)    Principal Problem:   Digoxin toxicity Active Problems:   HYPERLIPIDEMIA   Acute on chronic combined systolic and diastolic CHF (congestive heart failure)   Acute respiratory failure   Aortic stenosis, moderate   Acute encephalopathy   Unspecified hypothyroidism

## 2013-06-23 NOTE — Care Management Note (Signed)
    Page 1 of 1   06/23/2013     2:00:06 PM   CARE MANAGEMENT NOTE 06/23/2013  Patient:  [name], [name]   Account Number:  [id number]  Date Initiated:  06/23/2013  Documentation initiated by:  Sharrie Rothman  Subjective/Objective Assessment:   Pt admitted from home with encephalopathy and CHF. Pt lives with his daughter and is active with [facility]. Pt is now end of life.     Action/Plan:   Daughter is choosing [facility] in [place]. CSW to arrange discharge to facility.   Anticipated DC Date:  06/23/2013   Anticipated DC Plan:  HOSPICE MEDICAL FACILITY  In-house referral  Clinical Social Worker      DC Planning Services  CM consult      Choice offered to / List presented to:             Status of service:  Completed, signed off Medicare Important Message given?  NA - LOS <3 / Initial given by admissions (If response is "NO", the following Medicare IM given date fields will be blank) Date Medicare IM given:   Date Additional Medicare IM given:    Discharge Disposition:  HOSPICE MEDICAL FACILITY  Per UR Regulation:    If discussed at Long Length of Stay Meetings, dates discussed:    Comments:  06/23/13 1400 Arlyss Queen, RN BSN CM

## 2013-06-23 NOTE — Clinical Social Work Note (Signed)
Pt's daughter accepts bed at [facility]. Facility aware of ventimask and okay. CSW will fax d/c summary when completed. Pt to transfer via [facility]. Out of facility DNR in packet. Support provided to daughter.   [name], [place] [phone]

## 2013-06-23 NOTE — Discharge Summary (Addendum)
Physician Discharge Summary  [name] ZOX:[id number] DOB: [birth year] DOA: 06/21/2013  PCP: [name], MD  Admit date: 06/21/2013 Discharge date: 06/23/2013  Recommendations for Outpatient Follow-up:  1. Ongoing hospice care 2. Daughter hopes to be able to interact with patient somewhat, please use morphine and Ativan only for severe pain or agitation only.  Discharge Diagnoses:  1. Acute encephalopathy 2. Acute on chronic respiratory failure with hypercapnia and respiratory acidosis 3. Acute renal failure superimposed on chronic kidney disease stage III 4. Acute on chronic systolic congestive heart failure 5. COPD, chronic respiratory failure on oxygen 6. Digoxin level elevation 7. Permanent atrial fibrillation, not a candidate for rate control or digoxin secondary to bradycardia 8. History of spontaneous retroperitoneal hematoma, not an anticoagulation or antiplatelet candidate 9. Diabetes mellitus type 2  Discharge Condition: Poor, death expected within the next few days Disposition: Residential hospice  Diet recommendation: As desired  [facility] Weights   06/21/13 2303 06/22/13 0249 06/23/13 0549  Weight: 110.224 kg (243 lb) 107.9 kg (237 lb 14 oz) 107.6 kg (237 lb 3.4 oz)    History of present illness:  79 year old man with complex past medical history including chronic systolic and diastolic heart failure, coronary artery disease, mitral valve replacement, permanent atrial fibrillation, moderate aortic stenosis and previous spontaneous retroperitoneal hematoma presented with confusion, hallucinations. Admitted for acute encephalopathy, acute on chronic combined heart failure, acute respiratory failure and elevated digoxin level. Admitting physician discussed with daughter, patient made DNR, and admitted.   Recently admitted for massive anasarca secondary to acute on chronic systolic heart, right-sided heart failure secondary to pulmonary hypertension. Long-term  prognosis felt to be poor patient was discharged home with hospice  Hospital Course:  Patient was admitted for acute hypercapnic respiratory failure, respiratory acidosis, acute on chronic heart failure and COPD. On admission the patient was made DO NOT RESUSCITATE, and in discussion with daughter patient was admitted to the medical floor and aggressive intervention was declined by daughter including BiPAP and intubation. Family desired conservative treatment of heart failure and monitoring of oxygenation. He was seen in consultation with cardiology and diuresis was attempted with modest urine output but no clinical improvement. In discussion with cardiology it is felt his primary issues were COPD and heart failure and digoxin elevation was felt to be clinically insignificant. His daughter reported the patient had not been wearing oxygen at home secondary to difficulty maintaining it in place and has not eaten anything since 12/27. The patient has remained unresponsive since admission and failed to improve clinically, he remains hypoxic on Venturi mask. After further discussion daughter desired placement at residential hospice. Individual issues as below.   1. Acute encephalopathy, likely multifactorial. Predominantly favor respiratory acidosis with CO2 retention secondary to COPD, known noncompliance with oxygen at home, acute on chronic CHF.  2. Acute hypercapnic respiratory failure, respiratory acidosis: Likely predominantly from COPD, CHF. He has been hospice in the past for COPD. daughter declined BiPAP and intubation. 3. Acute renal failure on chronic kidney disease stage III , somewhat worse with diuresis 4. Acute on chronic systolic heart failure, no significant change clinically 5. COPD, chronic respiratory failure on oxygen, noncompliant at home 6. Digoxin level elevation. No significant change. Discussed with Dr. Diona Browner and literature reviewed--this is felt to be clinically insignificant and  noncontributory to his current condition. Poison control recommended observation, no antidote. 7. History of coronary artery disease, CABG, mitral valve replacement, permanent atrial fibrillation, pulmonary hypertension, moderate aortic stenosis not a candidate for antiplatelet therapy  Secondary to history of bleeding. 8. Permanent atrial fibrillation, discontinue digoxin. Not on any rate control agents secondary to bradycardia.  9. History of spontaneous retroperitoneal hematoma, not an anticoagulation candidate.  10. Diabetes mellitus type 2. No hypoglycemia noted.  11. Hypothyroidism 12. Moderate aortic stenosis  Consultants:  Cardiology  Procedures: none Antibiotics: none  Discharge Instructions     Medication List    STOP taking these medications       CALCIUM 600 + D PO     CENTRUM SILVER tablet     Cholecalciferol 1000 UNITS tablet     digoxin 0.125 MG tablet  Commonly known as:  LANOXIN     guaiFENesin 600 MG 12 hr tablet  Commonly known as:  MUCINEX     lovastatin 40 MG tablet  Commonly known as:  MEVACOR     metolazone 2.5 MG tablet  Commonly known as:  ZAROXOLYN     omeprazole 40 MG capsule  Commonly known as:  PRILOSEC     potassium chloride SA 20 MEQ tablet  Commonly known as:  K-DUR,KLOR-CON     tamsulosin 0.4 MG Caps capsule  Commonly known as:  FLOMAX      TAKE these medications       acetaminophen 650 MG suppository  Commonly known as:  TYLENOL  Place 1 suppository (650 mg total) rectally every 6 (six) hours as needed for mild pain (or Fever >/= 101).     albuterol (2.5 MG/3ML) 0.083% nebulizer solution  Commonly known as:  PROVENTIL  Take 3 mLs (2.5 mg total) by nebulization every 6 (six) hours as needed for wheezing or shortness of breath.     cycloSPORINE 0.05 % ophthalmic emulsion  Commonly known as:  RESTASIS  Place 1 drop into both eyes 2 (two) times daily.     Fluticasone-Salmeterol 100-50 MCG/DOSE Aepb  Commonly known as:   ADVAIR  Inhale 1 puff into the lungs 2 (two) times daily.     furosemide 80 MG tablet  Commonly known as:  LASIX  Take 1 tablet (80 mg total) by mouth 2 (two) times daily. For 2 days start date 06/12/13     ipratropium 0.02 % nebulizer solution  Commonly known as:  ATROVENT  Take 0.5 mg by nebulization every 6 (six) hours as needed for wheezing or shortness of breath.     levothyroxine 100 MCG tablet  Commonly known as:  SYNTHROID, LEVOTHROID  Take 1 tablet (100 mcg total) by mouth daily before breakfast.     LORazepam 0.5 MG tablet  Commonly known as:  ATIVAN  Place 1 tablet (0.5 mg total) under the tongue every 6 (six) hours as needed for anxiety (severe anxiety).     morphine 10 MG/5ML solution  Take 1.3 mLs (2.6 mg total) by mouth every 4 (four) hours as needed for severe pain (for very severe pain only).     nystatin 100000 UNIT/GM Powd  Applied to affected skin to 3 times a day       Allergies  Allergen Reactions  . Aspirin Other (See Comments)    CANNOT TAKE DE TO BLEEDING  . Gabapentin     Patient cannot take over 200 mg dose at a time as it causes jerking or spasms  . Naproxen Other (See Comments)    CANNOT TAKE/NO REASON GIVEN    The results of significant diagnostics from this hospitalization (including imaging, microbiology, ancillary and laboratory) are listed below for reference.    Significant Diagnostic Studies: US  Paracentesis  Dg Chest Portable 1 View  06/22/2013   CLINICAL DATA:  Shortness of breath and decreased oxygen saturation tonight. History of mitral valve replacement.  EXAM: PORTABLE CHEST - 1 VIEW  COMPARISON:  06/13/2013  FINDINGS: Shallow inspiration. Cardiac enlargement with increased pulmonary vascularity suggesting vascular congestion. Perihilar changes suggesting early edema. Findings are progressing since previous study. Suggestion of small bilateral pleural effusions with basilar atelectasis. Calcified and tortuous aorta. No  pneumothorax.  IMPRESSION: Cardiac enlargement with developing pulmonary vascular congestion, perihilar edema, and small effusions.   Electronically Signed   By: Burman Nieves M.D.   On: 06/22/2013 00:19    Microbiology: Recent Results (from the past 240 hour(s))  GC/CHLAMYDIA PROBE AMP     Status: None   Collection Time    06/13/13  9:54 PM      Result Value Range Status   CT Probe RNA NEGATIVE  NEGATIVE Final   GC Probe RNA NEGATIVE  NEGATIVE Final   Comment: (NOTE)                                                                                               **Normal Reference Range: Negative**          Assay performed using the Gen-Probe APTIMA COMBO2 (R) Assay.     Acceptable specimen types for this assay include APTIMA Swabs (Unisex,     endocervical, urethral, or vaginal), first void urine, and ThinPrep     liquid based cytology samples.     Performed at [facility]  CULTURE, ROUTINE-GENITAL     Status: None   Collection Time    06/13/13 10:30 PM      Result Value Range Status   Specimen Description URETHRA   Final   Special Requests NONE   Final   Culture     Final   Value: MULTIPLE ORGANISMS PRESENT, NONE PREDOMINANT     Performed at [facility]   Report Status 06/16/2013 FINAL   Final     Labs: Basic Metabolic Panel:  Recent Labs Lab 06/17/13 0602 06/18/13 0517 06/21/13 2358 06/22/13 0510 06/23/13 0511  NA 138 136 133* 136* 139  K 3.4* 3.6 4.9 4.8 5.2  CL 92* 92* 89* 91* 92*  CO2 33* 36* 33* 33* 34*  GLUCOSE 91 85 113* 100* 89  BUN 35* 35* 52* 53* 54*  CREATININE 1.44* 1.51* 1.65* 1.69* 1.87*  CALCIUM 8.6 8.2* 9.0 8.8 9.0  MG 2.0 1.9  --   --   --    Liver Function Tests:  Recent Labs Lab 06/22/13 0510  AST 35  ALT 13  ALKPHOS 50  BILITOT 0.7  PROT 7.4  ALBUMIN 3.3*   CBC:  Recent Labs Lab 06/21/13 2358 06/22/13 0510  WBC 7.4 6.9  NEUTROABS 5.5  --   HGB 13.9 13.5  HCT 43.7 42.5  MCV 101.4* 101.7*  PLT 125* 122*    Cardiac Enzymes:  Recent Labs Lab 06/22/13 0006  TROPONINI <0.30    Recent Labs  05/11/13 0553 06/12/13 1741 06/21/13 2358  PROBNP 7912.0* 9057.0* 15443.0*   CBG:  Recent Labs Lab 06/22/13 0758 06/23/13 0722  GLUCAP 79 91    Principal Problem:   Digoxin toxicity Active Problems:   HYPERLIPIDEMIA   Acute on chronic combined systolic and diastolic CHF (congestive heart failure)   Acute respiratory failure   Aortic stenosis, moderate   Acute encephalopathy   Unspecified hypothyroidism   Time coordinating discharge: 60 minutes including greater than 50% in counseling  Signed:  Brendia Sacks, MD [facility] 06/23/2013, 2:26 PM

## 2013-06-23 NOTE — Progress Notes (Signed)
CRITICAL VALUE ALERT  Critical value received:  Digoxin toxicity 2.6  Date of notification:  06/23/2013  Time of notification: 0647  Critical value read back:yes  Nurse who received alert:  Richardean Chimera  MD notified (1st page):  Dr Irene Limbo  Time of first page:  0715  MD notified (2nd page):   Time of second page:  Responding MD:  Dr Irene Limbo  Time MD responded:  [phone]

## 2013-06-23 NOTE — Progress Notes (Addendum)
Report called and given to Clydie Braun, Charity fundraiser at hospice house. Per hospice house patients IV will remain intact no removal needed prior to transport.  Will continue to monitor patient.

## 2013-06-28 ENCOUNTER — Telehealth: Payer: Self-pay

## 2013-06-28 NOTE — Telephone Encounter (Signed)
Patient past away @ [facility]. Per Iver Nestle in [place]

## 2013-07-02 ENCOUNTER — Other Ambulatory Visit (HOSPITAL_COMMUNITY)

## 2013-07-05 ENCOUNTER — Ambulatory Visit (HOSPITAL_COMMUNITY): Admitting: Oncology

## 2013-07-25 DEATH — deceased

## 2013-10-01 IMAGING — CT CT CTA ABD/PEL W/CM AND/OR W/O CM
4 of 10 series · 17 of 46 positions shown · IV contrast (APPLIED)
Comparison: 07/22/2012

CLINICAL DATA: Retroperitoneal hematoma

CT ANGIOGRAPHY ABDOMEN AND PELVIS
TECHNIQUE: Multidetector CT imaging of the abdomen and pelvis was
performed using the standard protocol before and during bolus
administration of intravenous contrast.  Multiplanar reconstructed
images including MIPs were obtained and reviewed to evaluate the
vascular anatomy.
Contrast: 100mL OMNIPAQUE IOHEXOL 350 MG/ML SOLN

[Series 2: without 5.0 st · axial · non-contrast · 0.96mm/px · z∈[-215,-120]mm · 2 of 57 slices shown]
[im 19/57  lung]
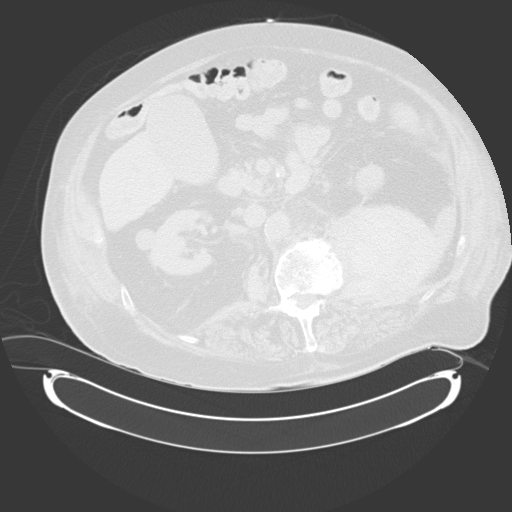
[im 38/57  lung]
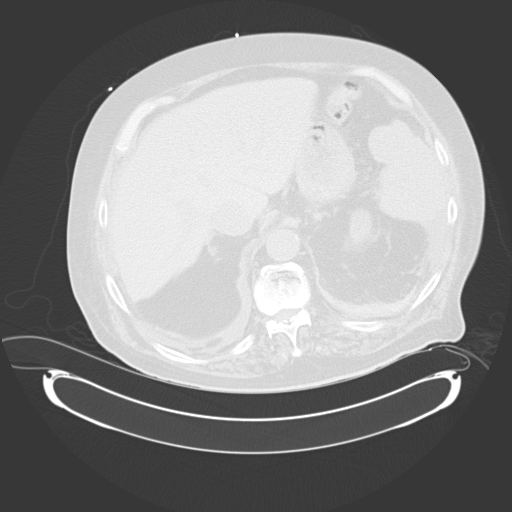

[Series 7: dissection 2.0 st · axial · 0.97mm/px · z∈[-490,-52]mm · 8 of 251 slices shown]
[im 16/251  lung]
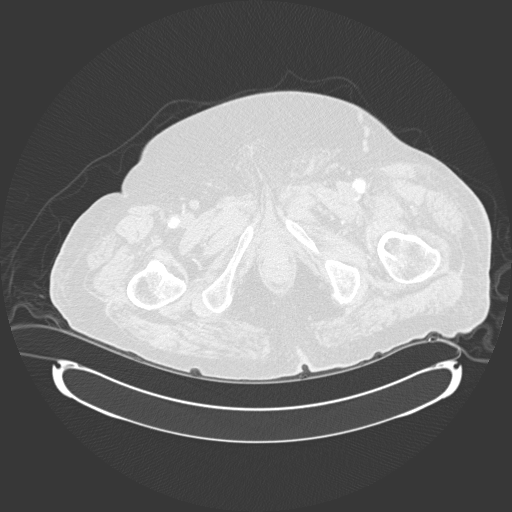
[im 47/251  lung]
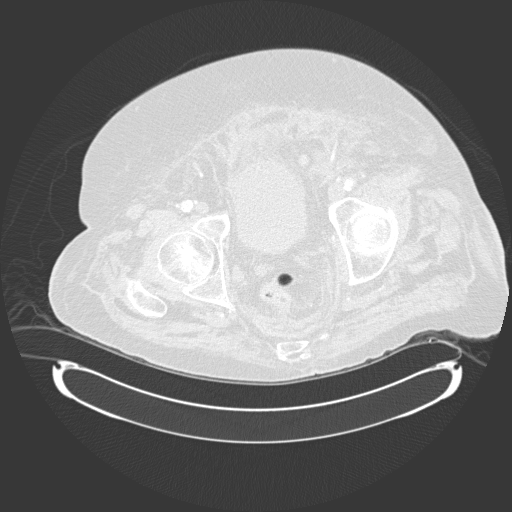
[im 79/251  lung]
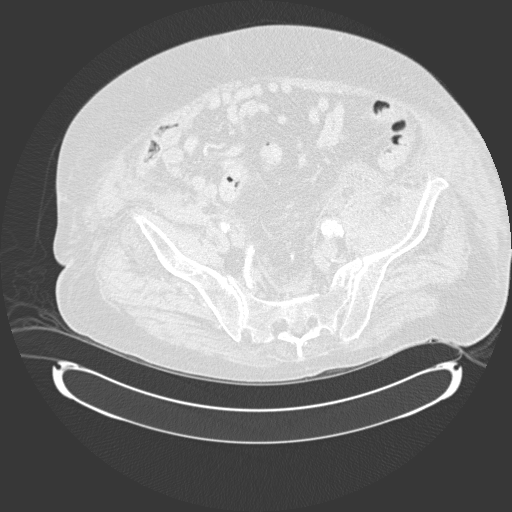
[im 110/251  lung]
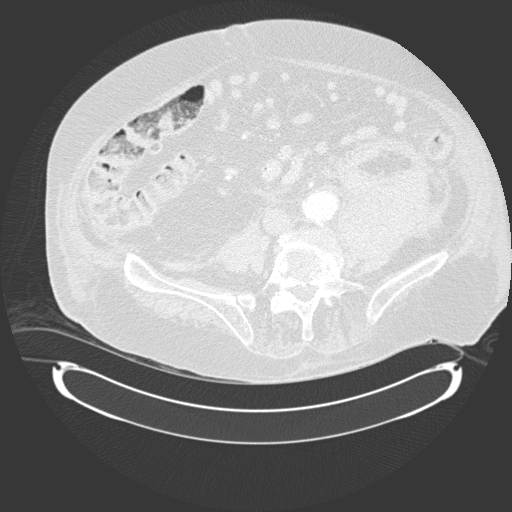
[im 141/251  lung]
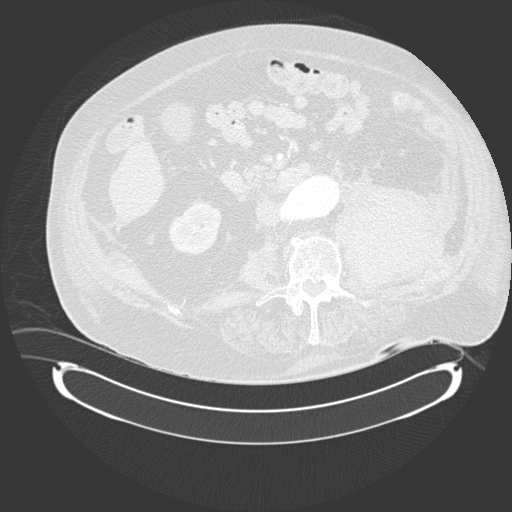
[im 172/251  lung]
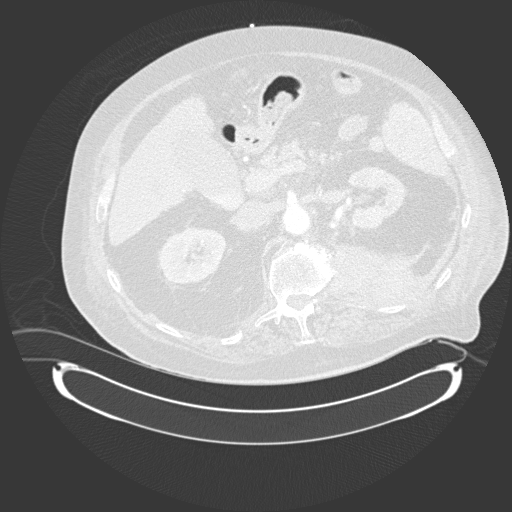
[im 204/251  lung]
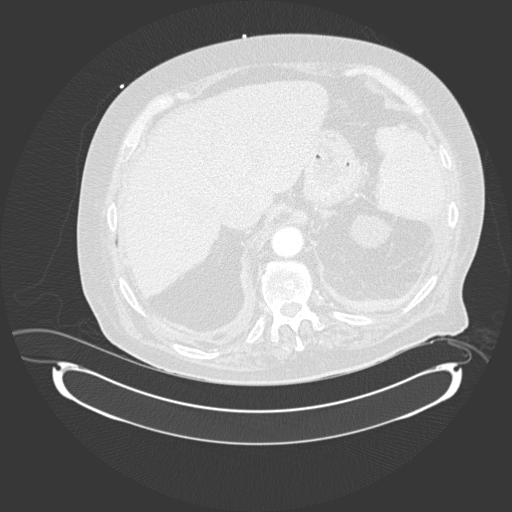
[im 235/251  lung]
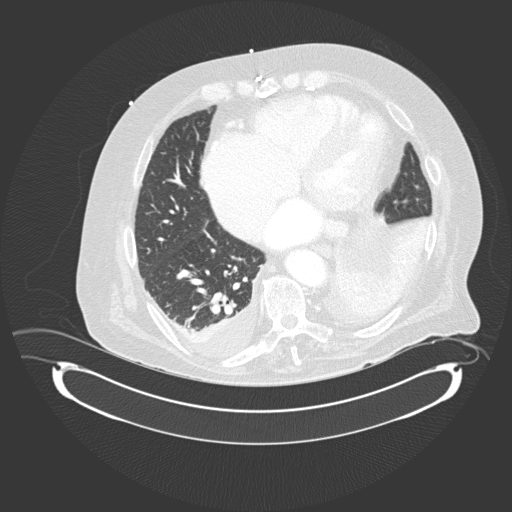

[Series 10: coronals · coronal · 1.20mm/px · 2 of 157 slices shown]
[im 53/157  soft-tissue]
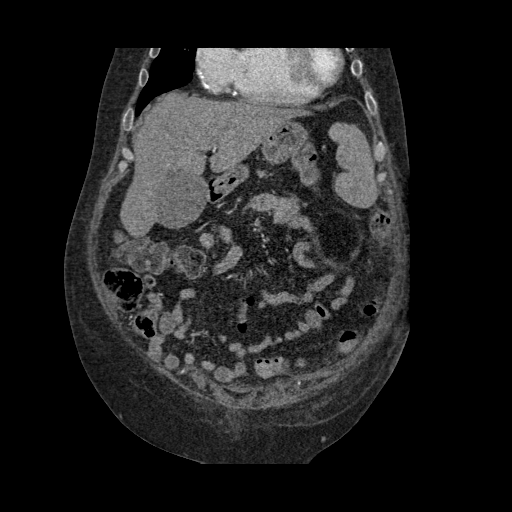
[im 105/157  soft-tissue]
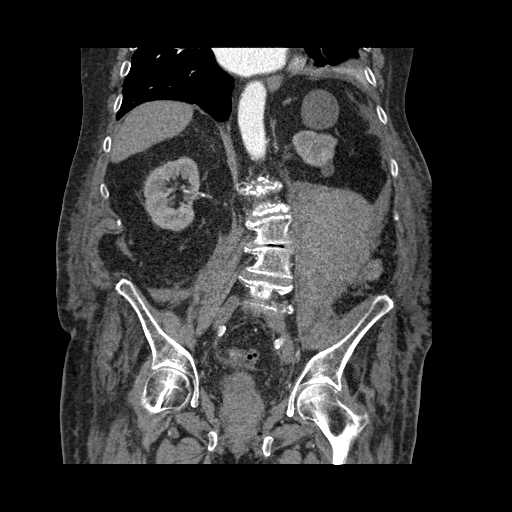

[Series 12: 3 min delays 5.0 b31f st · axial · 0.98mm/px · z∈[-442,-102]mm · 5 of 104 slices shown]
[im 18/104  lung]
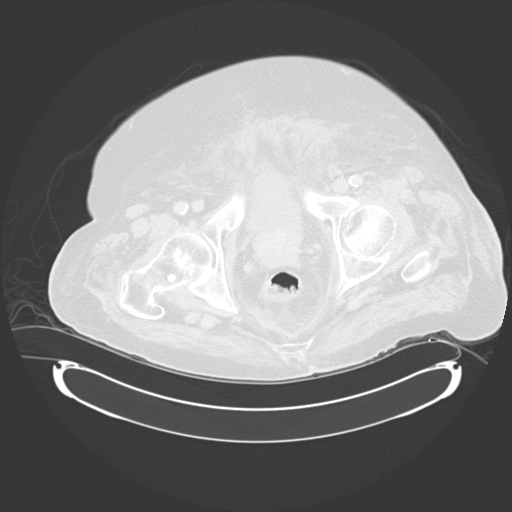
[im 35/104  soft-tissue]
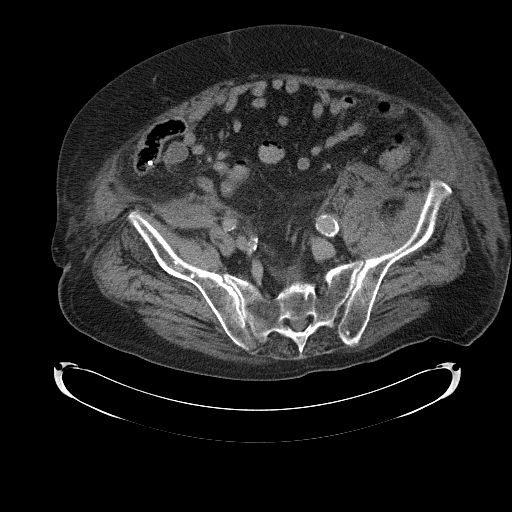
[im 52/104  lung]
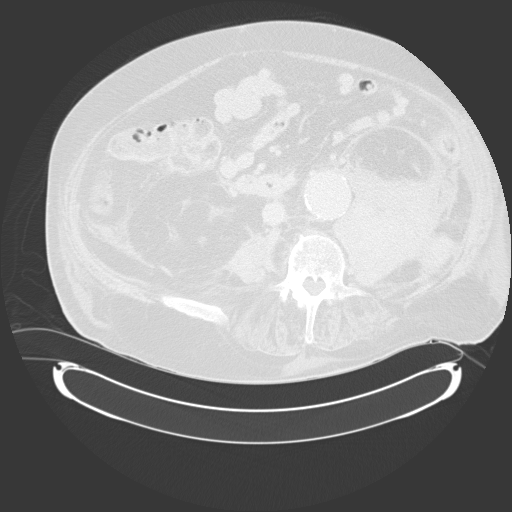
[im 69/104  soft-tissue]
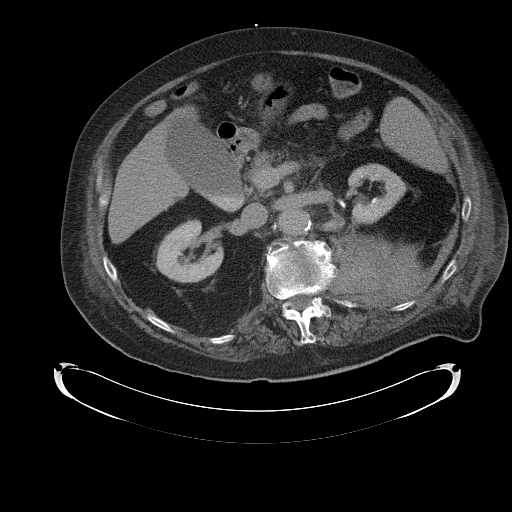
[im 86/104  lung]
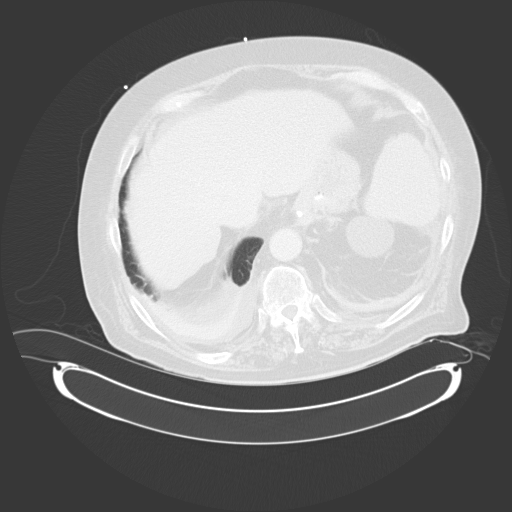

[17 of 46 positions shown; findings below may reference images not displayed]

Arterial findings:
Aorta:                  Scattered calcified plaque in the
visualized distal descending thoracic and suprarenal abdominal
aorta.  A fusiform infrarenal aneurysm measuring 5 cm maximal
transverse diameter as before. Aorta tapers to   2.7 cm at the
bifurcation.  No intraluminal thrombus.  No evidence of active
extravasation.

Celiac axis:            Mild ostial plaque, unremarkable distal
branching

Superior mesenteric:Scattered plaque, no high-grade stenosis

Left renal:             Single, with scattered plaque, no high-
grade stenosis.

Right renal:            Single, with scattered plaque, no high-
grade proximal stenosis.

Inferior mesenteric:Patent, arising from the aneurysmal segment of
the aorta.

Left iliac:             Scattered atheromatous plaque throughout
without aneurysm, dissection or stenosis.  The stent in the
proximal SFA is partially visualized.  The

Right iliac:            Scattered calcified plaque without high-
grade stenosis, aneurysm or dissection.

Venous findings:  Patent hepatic veins, portal vein, superior
mesenteric vein, splenic vein, bilateral renal veins, IVC, and
iliac venous system.

 Review of the MIP images confirms the above findings.

Nonvascular findings: 9.5 x 11 x 15.9 cm left retroperitoneal
hematoma without convincing interval increase in size since the
previous study.  No evidence of active extravasation.

Small bilateral pleural effusions persist, with dependent
atelectasis/consolidation posteriorly in both lower lobes, left
greater than right.  There is four-chamber cardiac enlargement.
Previous median sternotomy.  Gallbladder physiologically distended
with multiple small layering partially calcified calculi in its
dependent aspect.  Unremarkable liver.  Scattered splenic
granulomas.  Small bilateral renal cysts.  Mild pancreatic
parenchymal atrophy.  There are streaky inflammatory/edematous
changes in the retroperitoneum anterior to the aorta and extending
across the midline anterior to the   right psoas musculature in the
pelvis, contiguous with the left retroperitoneal hematoma.
Stomach, small bowel, and colon are non distended.  The   urinary
bladder is incompletely distended.  There is a trace amount of
pelvic ascites.  No free air.  No adenopathy localized.  Multilevel
spondylitic changes and scoliosis in the lumbar spine.
IMPRESSION: 1.  Stable   large left retroperitoneal hematoma.  No evidence of
active extravasation or other acute arterial abnormality.
2.  Stable 5 cm fusiform abdominal aortic aneurysm.
3.  Cholelithiasis.

## 2013-11-04 IMAGING — CR DG CHEST 1V PORT
1 series · 1 of 1 positions shown · non-contrast
Comparison: 08/19/2012

CLINICAL DATA: Hypotension.  Hypoxia.

PORTABLE CHEST - 1 VIEW

[view not recorded]
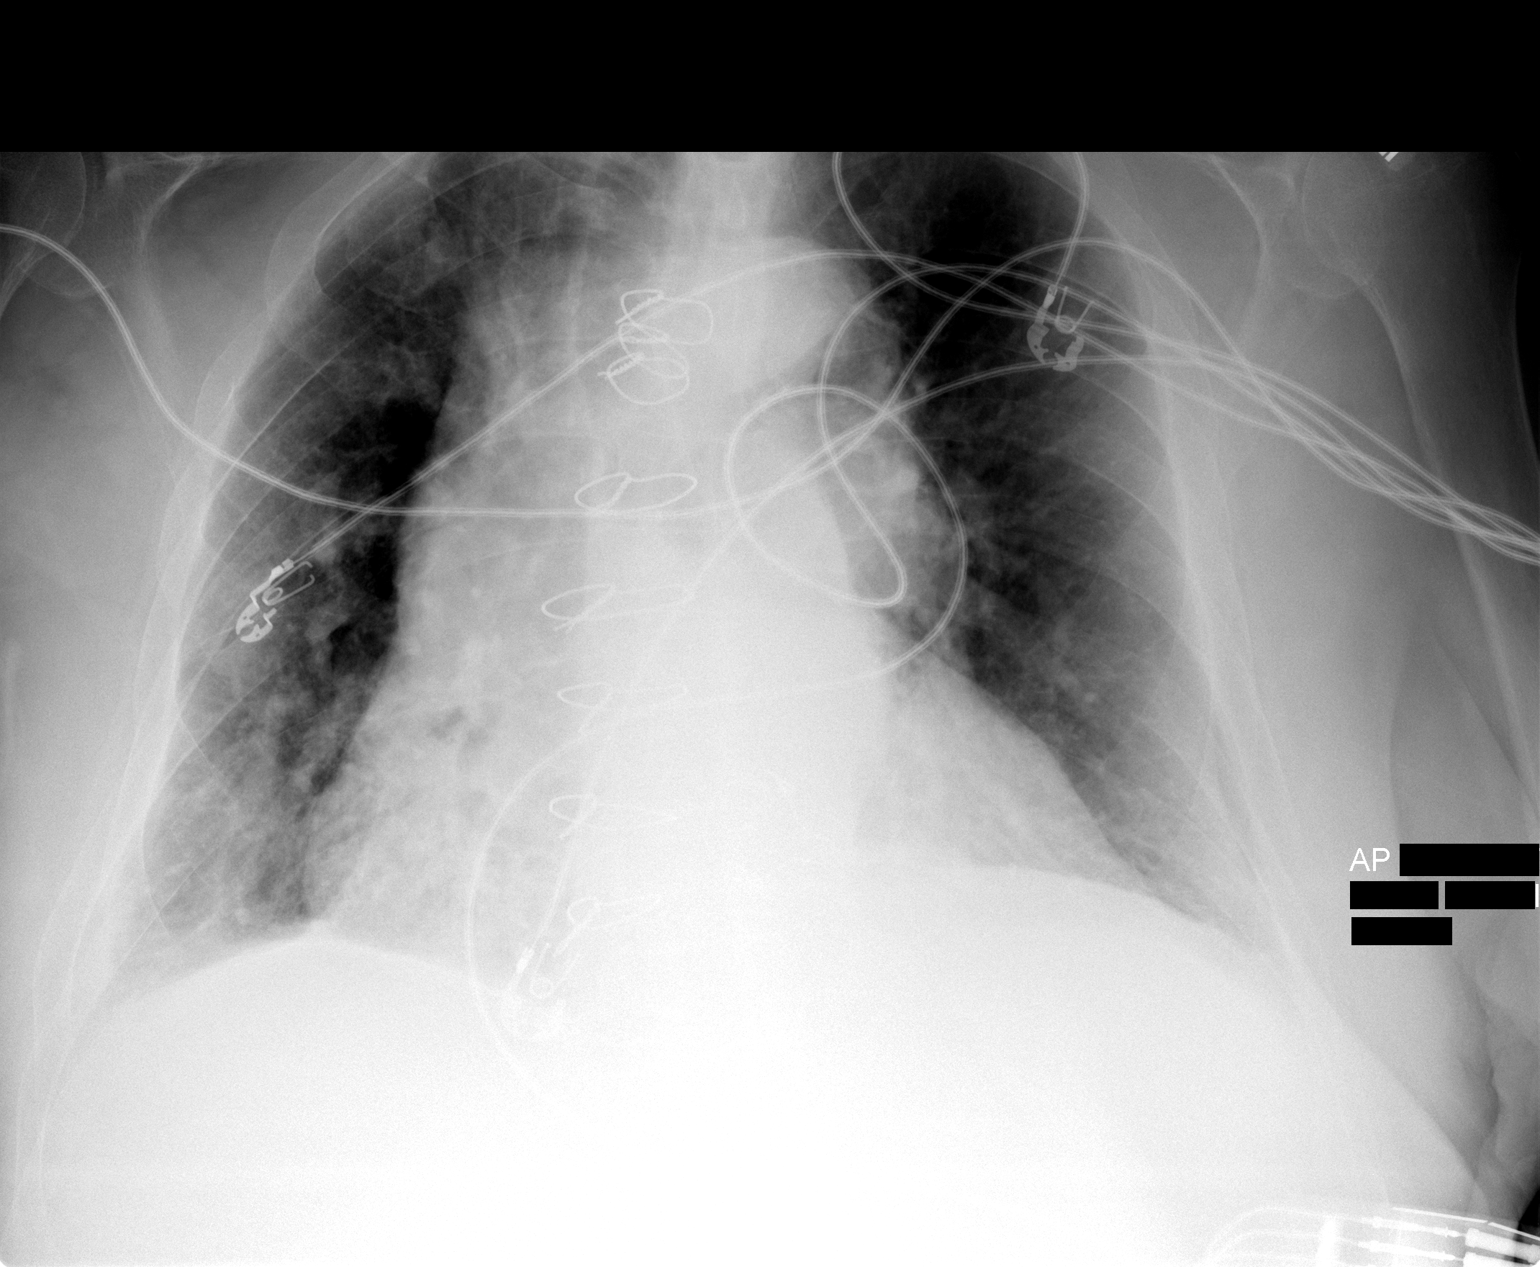

[1 of 1 positions shown; findings below may reference images not displayed]

FINDINGS: Cardiomegaly and mild vascular congestion. Prior CABG.
Prominent markings at the right base are redemonstrated consistent
with pneumonia.  There may be slight improvement compared with
prior film.  Small bilateral effusions are present.
IMPRESSION: Prominent markings right base consistent with pneumonia appears
slightly improved compared with priors.  Cardiomegaly.

## 2014-02-15 ENCOUNTER — Other Ambulatory Visit: Payer: Self-pay | Admitting: *Deleted
